# Patient Record
Sex: Female | Born: 1955
Health system: Southern US, Community
[De-identification: ages and names within clinical notes are randomized; demographics above are authoritative.]

## PROBLEM LIST (undated history)

## (undated) DIAGNOSIS — R0902 Hypoxemia: Secondary | ICD-10-CM

## (undated) DIAGNOSIS — F329 Major depressive disorder, single episode, unspecified: Secondary | ICD-10-CM

## (undated) DIAGNOSIS — I2089 Other forms of angina pectoris: Secondary | ICD-10-CM

## (undated) DIAGNOSIS — J439 Emphysema, unspecified: Secondary | ICD-10-CM

## (undated) DIAGNOSIS — N189 Chronic kidney disease, unspecified: Secondary | ICD-10-CM

## (undated) DIAGNOSIS — I6529 Occlusion and stenosis of unspecified carotid artery: Secondary | ICD-10-CM

## (undated) DIAGNOSIS — N393 Stress incontinence (female) (male): Secondary | ICD-10-CM

## (undated) DIAGNOSIS — J449 Chronic obstructive pulmonary disease, unspecified: Secondary | ICD-10-CM

## (undated) DIAGNOSIS — F418 Other specified anxiety disorders: Secondary | ICD-10-CM

## (undated) DIAGNOSIS — F419 Anxiety disorder, unspecified: Secondary | ICD-10-CM

## (undated) DIAGNOSIS — K219 Gastro-esophageal reflux disease without esophagitis: Secondary | ICD-10-CM

## (undated) DIAGNOSIS — I10 Essential (primary) hypertension: Secondary | ICD-10-CM

## (undated) DIAGNOSIS — I219 Acute myocardial infarction, unspecified: Secondary | ICD-10-CM

## (undated) DIAGNOSIS — I2510 Atherosclerotic heart disease of native coronary artery without angina pectoris: Secondary | ICD-10-CM

## (undated) DIAGNOSIS — Z923 Personal history of irradiation: Secondary | ICD-10-CM

## (undated) DIAGNOSIS — I208 Other forms of angina pectoris: Secondary | ICD-10-CM

## (undated) DIAGNOSIS — E119 Type 2 diabetes mellitus without complications: Secondary | ICD-10-CM

## (undated) DIAGNOSIS — E785 Hyperlipidemia, unspecified: Secondary | ICD-10-CM

## (undated) DIAGNOSIS — F32A Depression, unspecified: Secondary | ICD-10-CM

## (undated) DIAGNOSIS — R51 Headache: Secondary | ICD-10-CM

## (undated) DIAGNOSIS — C50919 Malignant neoplasm of unspecified site of unspecified female breast: Secondary | ICD-10-CM

## (undated) HISTORY — DX: Essential (primary) hypertension: I10

## (undated) HISTORY — DX: Occlusion and stenosis of unspecified carotid artery: I65.29

## (undated) HISTORY — PX: CARDIAC CATHETERIZATION: SHX172

## (undated) HISTORY — DX: Personal history of irradiation: Z92.3

## (undated) HISTORY — DX: Atherosclerotic heart disease of native coronary artery without angina pectoris: I25.10

## (undated) HISTORY — PX: ELBOW SURGERY: SHX618

## (undated) HISTORY — DX: Other forms of angina pectoris: I20.8

## (undated) HISTORY — DX: Other forms of angina pectoris: I20.89

## (undated) HISTORY — DX: Anxiety disorder, unspecified: F41.9

## (undated) HISTORY — PX: BREAST SURGERY: SHX581

## (undated) HISTORY — DX: Depression, unspecified: F32.A

## (undated) HISTORY — DX: Major depressive disorder, single episode, unspecified: F32.9

## (undated) HISTORY — DX: Emphysema, unspecified: J43.9

## (undated) HISTORY — DX: Malignant neoplasm of unspecified site of unspecified female breast: C50.919

## (undated) HISTORY — DX: Hypoxemia: R09.02

## (undated) HISTORY — DX: Chronic obstructive pulmonary disease, unspecified: J44.9

## (undated) HISTORY — DX: Acute myocardial infarction, unspecified: I21.9

## (undated) HISTORY — PX: CATARACT EXTRACTION: SUR2

## (undated) HISTORY — DX: Hyperlipidemia, unspecified: E78.5

## (undated) HISTORY — DX: Other specified anxiety disorders: F41.8

## (undated) HISTORY — DX: Type 2 diabetes mellitus without complications: E11.9

---

## 1997-10-09 ENCOUNTER — Other Ambulatory Visit: Admission: RE | Admit: 1997-10-09 | Discharge: 1997-10-09 | Payer: Self-pay | Admitting: Obstetrics & Gynecology

## 2001-05-24 ENCOUNTER — Other Ambulatory Visit: Admission: RE | Admit: 2001-05-24 | Discharge: 2001-05-24 | Payer: Self-pay | Admitting: Family Medicine

## 2002-01-31 ENCOUNTER — Ambulatory Visit (HOSPITAL_COMMUNITY): Admission: RE | Admit: 2002-01-31 | Discharge: 2002-01-31 | Payer: Self-pay | Admitting: Internal Medicine

## 2002-01-31 ENCOUNTER — Encounter (INDEPENDENT_AMBULATORY_CARE_PROVIDER_SITE_OTHER): Payer: Self-pay | Admitting: Specialist

## 2004-08-26 ENCOUNTER — Emergency Department (HOSPITAL_COMMUNITY): Admission: EM | Admit: 2004-08-26 | Discharge: 2004-08-26 | Payer: Self-pay | Admitting: Emergency Medicine

## 2006-11-01 ENCOUNTER — Ambulatory Visit: Payer: Self-pay | Admitting: Cardiology

## 2006-11-01 ENCOUNTER — Inpatient Hospital Stay (HOSPITAL_COMMUNITY): Admission: EM | Admit: 2006-11-01 | Discharge: 2006-11-05 | Payer: Self-pay | Admitting: Emergency Medicine

## 2006-11-02 HISTORY — PX: CORONARY STENT PLACEMENT: SHX1402

## 2006-11-17 ENCOUNTER — Ambulatory Visit: Payer: Self-pay | Admitting: *Deleted

## 2006-11-25 ENCOUNTER — Ambulatory Visit: Payer: Self-pay

## 2006-11-25 ENCOUNTER — Ambulatory Visit: Payer: Self-pay | Admitting: Cardiovascular Disease

## 2006-12-24 ENCOUNTER — Encounter (HOSPITAL_COMMUNITY): Admission: RE | Admit: 2006-12-24 | Discharge: 2007-01-07 | Payer: Self-pay | Admitting: Cardiovascular Disease

## 2007-03-03 ENCOUNTER — Ambulatory Visit: Payer: Self-pay | Admitting: Cardiovascular Disease

## 2007-05-16 ENCOUNTER — Ambulatory Visit: Payer: Self-pay | Admitting: Cardiovascular Disease

## 2007-05-16 LAB — CONVERTED CEMR LAB
ALT: 21 units/L (ref 0–35)
Albumin: 3.8 g/dL (ref 3.5–5.2)
Alkaline Phosphatase: 72 units/L (ref 39–117)
Total CHOL/HDL Ratio: 5.9
Total Protein: 6.8 g/dL (ref 6.0–8.3)
VLDL: 47 mg/dL — ABNORMAL HIGH (ref 0–40)

## 2007-07-01 ENCOUNTER — Ambulatory Visit: Payer: Self-pay | Admitting: Cardiovascular Disease

## 2008-10-05 ENCOUNTER — Emergency Department (HOSPITAL_COMMUNITY): Admission: EM | Admit: 2008-10-05 | Discharge: 2008-10-05 | Payer: Self-pay | Admitting: Emergency Medicine

## 2009-02-11 ENCOUNTER — Telehealth (INDEPENDENT_AMBULATORY_CARE_PROVIDER_SITE_OTHER): Payer: Self-pay | Admitting: *Deleted

## 2010-08-17 ENCOUNTER — Encounter: Payer: Self-pay | Admitting: Cardiovascular Disease

## 2010-11-06 LAB — URINALYSIS, ROUTINE W REFLEX MICROSCOPIC
Bilirubin Urine: NEGATIVE
Hgb urine dipstick: NEGATIVE
Nitrite: NEGATIVE
Protein, ur: NEGATIVE mg/dL
Specific Gravity, Urine: 1.012 (ref 1.005–1.030)
Urobilinogen, UA: 0.2 mg/dL (ref 0.0–1.0)

## 2010-11-06 LAB — CBC
HCT: 49.2 % — ABNORMAL HIGH (ref 36.0–46.0)
Hemoglobin: 17 g/dL — ABNORMAL HIGH (ref 12.0–15.0)
MCV: 91.1 fL (ref 78.0–100.0)
RBC: 5.4 MIL/uL — ABNORMAL HIGH (ref 3.87–5.11)
WBC: 12.5 10*3/uL — ABNORMAL HIGH (ref 4.0–10.5)

## 2010-11-06 LAB — POCT I-STAT, CHEM 8
BUN: 10 mg/dL (ref 6–23)
Creatinine, Ser: 0.8 mg/dL (ref 0.4–1.2)
Glucose, Bld: 90 mg/dL (ref 70–99)
Hemoglobin: 17.7 g/dL — ABNORMAL HIGH (ref 12.0–15.0)
TCO2: 28 mmol/L (ref 0–100)

## 2010-11-06 LAB — ETHANOL: Alcohol, Ethyl (B): 5 mg/dL (ref 0–10)

## 2010-11-06 LAB — RAPID URINE DRUG SCREEN, HOSP PERFORMED
Amphetamines: NOT DETECTED
Barbiturates: NOT DETECTED
Benzodiazepines: POSITIVE — AB
Opiates: NOT DETECTED
Tetrahydrocannabinol: NOT DETECTED

## 2010-11-06 LAB — DIFFERENTIAL
Eosinophils Absolute: 0.2 10*3/uL (ref 0.0–0.7)
Eosinophils Relative: 1 % (ref 0–5)
Lymphocytes Relative: 16 % (ref 12–46)
Lymphs Abs: 2 10*3/uL (ref 0.7–4.0)
Monocytes Relative: 5 % (ref 3–12)

## 2010-11-06 LAB — URINE MICROSCOPIC-ADD ON

## 2010-11-06 LAB — URINE CULTURE: Colony Count: >=100000

## 2010-12-09 NOTE — Assessment & Plan Note (Signed)
[facility]                            CARDIOLOGY OFFICE NOTE   NAME:Michelini, SHAGUN COOMER                      MRN:          [id number]  DATE:03/03/2007                            DOB:          07-Sep-1955    [name] was seen as an outpatient at the [facility] office  on March 03, 2007.  [name] is a very nice 55 year old woman who has  coronary artery disease and had a non-ST-elevation MI earlier this year.  She underwent percutaneous intervention of the right coronary artery.  Unfortunately, [name] has had a lot of social stressors lately.  She is currently drawing her last week of unemployment and has been  aggressively looking for a job, but has not had any luck obtaining work.  She has been very stressed about this and has had some increasing chest  pain.  She states that she is not sleeping well.  She has had some  dyspnea, but relates this to being out in the hot weather.  She has not  had any orthopnea or PND.  She has not had any symptoms similar to those  of her myocardial infarction.  She has resumed smoking cigarettes to  deal with the stress.  She denies palpitations, light-headedness,  syncope, orthopnea, PND, or edema.   CURRENT MEDICATIONS:  1. Aspirin 325 mg daily.  2. Plavix 75 mg daily.  3. Metoprolol ER 50 mg daily.  4. Lipitor 40 mg daily.  5. Trazodone 100 mg at bedtime.  6. Lisinopril 5 mg daily.   ALLERGIES:  No known drug allergies.   PHYSICAL EXAMINATION:  The patient is alert and oriented.  She is in no  acute distress.  Her weight is 216 pounds, blood pressure 120/79, heart rate 71,  respiratory rate is 16.  HEENT:  Normal.  NECK:  Normal carotid upstrokes with bilateral bruits.  Jugular venous  pressure is normal.  LUNGS:  There are a few rhonchi, but otherwise clear.  CARDIOVASCULAR:  The apex is non-displaced.  There is no right  ventricular heave or lift.  The heart is regular rate and rhythm  with a  2/6 ejection murmur along the left sternal border.  ABDOMEN:  Soft and nontender.  No organomegaly.  EXTREMITIES:  No cyanosis, clubbing, or edema.  Peripheral pulses are 2+  and equal throughout.   ASSESSMENT:  [name] appears stable from a cardiovascular  standpoint.  I think her symptoms are related to her high stress level.  She agrees and thinks that, if she is able to find work, she will begin  feeling much better.  She has had a long history of trouble with anxiety  and depression.  From a cardiac standpoint, her problems are as follows.  1. Coronary artery disease with non-ST-elevation myocardial      infarction.  She underwent stenting of her right coronary artery      and had a good result.  She did have some residual disease in other      portions of the vessel, but the other stenoses were no worse than  mild to moderate lesions.  She also had a total occlusion of the      small circumflex branch and appears to be stable with medical      therapy.  Will continue treatment with lisinopril, long-acting      metoprolol, and dual antiplatelet therapy with aspirin and      clopidogrel.  She was counseled about tobacco cessation.  2. Hypertension.  Her blood pressure is well-controlled on her current      medical therapy.  3. Dyslipidemia.  She has been on Pravachol and was recently switched      by her primary care physician to Lipitor.  We have scheduled      followup lipids and LFTs in 2 months.   I would like to see [name] back in approximately 3 months for  followup or sooner if any new problems arise.     Juanda Bond. [name], MD  Electronically Signed    MDC/MedQ  DD: 03/03/2007  DT: 03/04/2007  Job #: [id number]   cc:   Dr. Olivia Mackie

## 2010-12-09 NOTE — Assessment & Plan Note (Signed)
[facility]                            CARDIOLOGY OFFICE NOTE   NAME:[name], Reid                      MRN:          [id number]  DATE:07/01/2007                            DOB:          08/28/1955    [name] is a 55 year old woman with coronary artery disease.  She  had a non-ST elevation MI in April of this year.  She underwent PCI of  the right coronary artery at that time.  [name] has been  experiencing a good deal of stress at work.  She has had a lot of social  stressors over the last several months.  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  With increased stresses at work over  the last several days, she had noted increasing chest pain.  Her pain is  felt somewhat similar to her past cardiac pain but less intense.  She  complains of associated dyspnea but she does admit to chronic exertional  dyspnea.  She carries sublingual nitroglycerin but has not taken any.  She has no other complaints at this time.  She denies any exertional  symptoms.   CURRENT MEDICATIONS:  1. Aspirin 325 mg daily.  2. Plavix 75 mg daily.  3. Lisinopril 5 mg daily.  4. Trazodone 100 mg at bedtime.  5. Metoprolol ER 50 mg daily.  6. Lipitor 40 mg daily.   ALLERGIES:  NKDA.   EXAM:  The patient is alert and oriented.  She is in no acute distress.  Weight is 211, blood pressure 136/84, heart rate 65, respiratory rate  16.  HEENT:  Normal.  NECK:  Normal carotid upstrokes with bilateral carotid bruits.  LUNGS:  Clear to auscultation bilaterally.  HEART:  Regular rate and rhythm without murmurs or gallops.  ABDOMEN:  Soft, nontender.  No organomegaly, no abdominal bruits.  EXTREMITIES:  No clubbing, cyanosis or edema.  Peripheral pulses are  intact and equal.   EKG shows normal sinus rhythm and is within normal limits.   ASSESSMENT:  1. Coronary artery disease.   The patient remains on dual antiplatelet      therapy with aspirin and Plavix.  She is on a good medical program      that includes a statin, beta-blocker and ACE inhibitor.  I am      concerned about her recurrent chest pain.  While it is somewhat      difficult to sort out, as stress has been a major problem for her,      her pain is reminiscent of past cardiac chest pain.  I have      scheduled her for an exercise Myoview stress study.  We will be in      touch with her after the results of this are available.  Otherwise,      I did not make any changes to her medical regimen today.  2. Dyslipidemia.  She remains on atorvastatin 40 milligrams.  Lipid  panel from October 20th showed an LDL cholesterol of 100, an HDL of      26 and total cholesterol of 154, her triglycerides were 234, will      continue.  3. Hypertension.  Good control at present on metoprolol and      lisinopril.  4. Bilateral carotid bruits.  She underwent a carotid ultrasound back      in May of this year that showed mild plaque in the carotid arteries      with 0 to 39 percent stenosis.   For followup, I would like to see [name] back in 3 months.  If her  stress test is abnormal, will consider repeat cardiac catheterization.     Juanda Bond. [name], MD  Electronically Signed    MDC/MedQ  DD: 07/01/2007  DT: 07/01/2007  Job #: [id number]

## 2010-12-12 NOTE — Procedures (Signed)
[place]  Patient:    [name], [name] Visit Number: [id number] MRN: [id number]          Service Type: END Location: ENDO Attending Physician:  Vincent Peyer Dictated by:   Lowella Bandy. Olevia Perches, M.D. [facility] Admit Date:  01/31/2002 Discharge Date: 01/31/2002   CC:         Clois Dupes, M.D.   Procedure Report  PROCEDURES:  1. Upper endoscopy.  2. Colonoscopy.  ENDOSCOPIST:  Lowella Bandy. Olevia Perches, M.D.  INDICATIONS:  This 55 year old white female has complaint of epigastric pain and burning, partially relieved by Protonix.  She has had chronic nausea without vomiting and feelings of early satiety.  She was found to be Hemoccult positive on physical exam.  The other problems have been diarrhea and mucus. She has a six-week history of hematochezia preceeded by diarrhea.  On physical exam on December 25, 2001, her stool was Hemoccult negative.  She gives a family history of colon polyps in her mother.  She is undergoing upper endoscopy to evaluate her epigastric pain and colonoscopy to evaluate her hematochezia and Hemoccult positive stools.  ENDOSCOPE:  Olympus single-channel videoscope.  SEDATION:  Versed 5 mg IV.  Demerol 70 mg IV.  FINDINGS:  The Olympus single-channel videoscope was passed under direct vision through the posterior pharynx into the esophagus.  The patient was monitored by pulse oximeter.  Her oxygen saturations were 93% to 98%.  She was cooperative.  The proximal, mid and distal esophageal mucosa was normal.  The squamocolumnar junction was identified at 40 cm from the incisors.  Only minor irregularity of the Z line was noted, which was within normal limits.  There was no stricture and no hiatal hernia.  STOMACH:  The stomach was insufflated with air and showed empty stomach with a minimal amount of acid.  There was no food in the stomach.  The gastric outlet was unremarkable.  The mucosa of the antrum and body was normal.   Retroflexion of the endoscope revealed normal fundus and cardia.  DUODENUM:  The duodenal bulb was edematous, with large folds, mucosal hemorrhages and petechiae extending throughout the entire duodenal bulb.  The descending duodenum was normal.  Biopsies were taken from inflamed mucosa for CLOtest and for routine H&E stains.  The endoscope was then retracted and the stomach decompressed.  The patient tolerated the procedure well.  IMPRESSION:  Mild to moderate duodenitis status post biopsies and CLOtest.  COLONOSCOPY:  ENDOSCOPE:  Olympus single-channel videoscope.  SEDATION:  Additional Versed 5 mg IV and Demerol 20 mg IV.  FINDINGS:  The Olympus single-channel videoscope was passed under direct vision through the rectum to the sigmoid colon.  The patient was monitored by pulse oximeter. Her oxygen saturations were about 90% at all times.  Her prep was only marginal because she prepped only with 1/2 gallon of the GoLYTELY. The anal canal showed first grade hemorrhoids.  These were visualized in forward as well as retroflex view.  There was no prolapse and no active bleeding.  At the level of 15 cm from the rectum was a polyp measuring about 1 cm in diameter.  It was partially pedunculated, was removed with snare and sent to pathology.  Distal to this at the level of about 12 cm from the rectum was a sessile polyp on a fold which was ablated with hot biopsy and sent to pathology in the same specimen container.  The rest of the sigmoid colon was unremarkable.  There were no diverticula.  The splenic flexure, transverse colon, and hepatic flexure were normal, with some liquid stool obscuring the view in the right colon.  The cecal pouch was reached without difficulty and showed normal ileocecal valve and cecal pouch.  The colonoscope was then retracted and the colon decompressed.  Both polypectomy sites were inspected and appeared to have complete hemostasis.  IMPRESSION:  1. Two  sigmoid polyps status post polypectomy.  2. First grade hemorrhoids.  PLAN:  1. Postpolypectomy instructions.  2. Anusol HC suppositories for hemorrhoids, which I think are the cause of the patients hematochezia. 3. Librax, one p.o. b.i.d. for presumed alternating diarrhea, constipation and abdominal pain consistent with irritable bowel syndrome. Dictated by:   Lowella Bandy. Olevia Perches, M.D. National Attending Physician:  Vincent Peyer DD:  01/31/02 TD:  02/02/02 Job: [id number]

## 2010-12-12 NOTE — Assessment & Plan Note (Signed)
[facility]                            CARDIOLOGY OFFICE NOTE   NAME:[name], [name]                      MRN:          [id number]  DATE:11/17/2006                            DOB:          [birth year]    This is a 55 year old white female patient who presented to the hospital  with a non-ST elevation MI secondary to two-vessel coronary artery  disease.  She underwent bare metal stenting to a 95% RCA, but she also  had distal circumflex branch that PCI was attempted but was  unsuccessful.  She had mild segmental LV dysfunction with basal to mid  inferior hypokinesis, ejection fraction of 50%.  The patient also has  depression and was seen by Dr. [name] who placed her on trazodone and  __________ and to follow up with psychiatry through her primary care.  She was also found to have some glucose intolerance.  Her hemoglobin A1c  was elevated at 6.4, but she was not sent home on any diabetic  medications.   The patient is quite stressed out about her financial situation.  When  she came home, her job was terminated and she has no insurance.  She  denies any chest pain, palpitations, dizziness, shortness of breath or  presyncope.  Overall, from a cardiac standpoint, she is doing well.   CURRENT MEDICATIONS:  1. Trazodone 100 mg one-half q.h.s.  2. Aspirin 325 daily.  3. Plavix 75 mg daily.  4. Metoprolol 25 mg b.i.d.  5. Celexa one daily.  6. Lisinopril 5 mg daily.  7. Pravastatin 40 mg.  She was taking 2 a day and broke out in a rash      and stopped it.  She is not sure.  This was changed from      simvastatin in the hospital because it was a $4.00 drug.   PHYSICAL EXAMINATION:  GENERAL:  This is a pleasant 55 year old white  female in no acute distress.  VITAL SIGNS:  Blood pressure 115/80, pulse 64, weight 208.  NECK:  She has bilateral carotid bruits.  LUNGS:  Clear, anterior and posterolateral.  HEART:  Regular rate and rhythm at 64 beats  per minute.  Normal S1 and  S2.  No significant murmur heard.  ABDOMEN:  Soft without organomegaly, masses, lesions or abnormal  tenderness.  RIGHT GROIN:  Without hematoma or hemorrhage.  EXTREMITIES:  She has good distal pulses.  No clubbing, cyanosis, or  edema.   ELECTROCARDIOGRAM:  Normal sinus rhythm.  No acute change.   IMPRESSION:  1. Status post non-ST elevation myocardial infarction secondary to two-      vessel coronary artery disease.  Status post bare metal stenting to      the right coronary artery with residual distal left circumflex      occlusion with unsuccessful percutaneous coronary intervention to      this vessel.  2. Mild left ventricular dysfunction, ejection fraction of 50% with      basal mid inferior hypokinesis.  3. Hypertension.  4. Hypercholesterolemia.  5. Glucose intolerance that needs to be followed by primary.  6. Depression.  7. Increased social stressors, loss of job, no insurance.   PLAN AT THIS TIME:  We are looking for some Plavix samples to get her  through until she receives them through the Plavix Assistance Program.  I have asked her to start pravastatin 40 mg once a day one more time to  see if she tolerates this.  If not, she will need to be changed to  another drug, and I have filled out a worksheet in order for her to  obtain food stamps and apply for new jobs when she is approved after she  sees Dr. [name] back on Nov 25, 2006.      Ermalinda Barrios, PA-C  Electronically Signed      Signa Kell, MD, [facility]  Electronically Signed   ML/MedQ  DD: 11/17/2006  DT: 11/17/2006  Job #: [id number]   cc:   Juanda Bond. [name], MD  Adolm Joseph, M.D.

## 2010-12-12 NOTE — Discharge Summary (Signed)
NAMEHASTI, [name]               ACCOUNT NO.:  [id number]   MEDICAL RECORD NO.:  [id number]          PATIENT TYPE:  INP   LOCATION:  6523                         FACILITY:  [facility]   PHYSICIAN:  Juanda Bond. [name], MD  DATE OF BIRTH:  1956/04/27   DATE OF ADMISSION:  11/01/2006  DATE OF DISCHARGE:  11/05/2006                               DISCHARGE SUMMARY   PRIMARY CARE PHYSICIAN:  [name], M.D.   PRIMARY CARDIOLOGIST:  Juanda Bond. [name], M.D.   CONSULTING PHYSICIAN:  [name], M.D., psychiatry   PROCEDURE PERFORMED:  1. Cardiac catheterization date November 02, 2006.      a.     Two vessel coronary artery disease with total occlusion of a       small branch of the left circumflex and high grade focal stenosis       of the right coronary artery.      b.     Unsuccessful attempts at PCI of the left circumflex, but       unable to cross the loose wire as described with one bare-metal       stent.  The left circumflex at the proximal portion had a 40%       stenosis of the left circumflex, gave rise to a small intermediate       branch and a second OM territory, there is a third OM that is so       total occluded and fills late lightly for the antegrade flow.  The       right coronary artery has a high grade anterior origin, the mid       portion of the vessel had a 50% stenosis and the distal portion of       the vessel had a 95% irregular stenosis status post liberty 3 x 2       mm stent was placed with TIMI-3 flow throughout the vessel.  There       is a residual 50% to 60% stenosis in the mid vessel.  Dr. [name]       elected not to intervene on that area.   DISCHARGE DIAGNOSES:  1. Non-ST elevated myocardial infarction secondary to 2 vessel      coronary artery disease.      a.     Status post bare-metal stent for 95% right coronary artery       occlusion.      b.     Residual distal left circumflex occlusion (small vessel).  2. Depression.   SECONDARY  DIAGNOSES:  1. Hypertension.  2. Hypercholesterolemia.  3. Increased social stressors.      a.     No insurance and low income.   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  She felt very diaphoretic,  lightheaded, and short of breath.  Chest tightness was exhibited at a 7-  8/10.  She  also had radiation to both arms and her shoulder and her  neck.  The patient had had some discomfort the night before, which she  describes as squeezing sensation in the center of her chest with  radiation to the shoulders and back, lasting about 5 to 10 minutes.  She  put a heating pad on the area and felt better, was able to sleep.  However, the following morning she exhibited symptoms as described.  The  patient's co-worker called 9-1-1.  She was given baby aspirin and  nitroglycerin and was also given an injection of Toradol with minimal  relief of her chest discomfort in the ER.   The patient was seen and examined by Dr. Jacqulyn Reid and [name], ACNP in the emergency room.  The patient's vital signs were  found to be 121/55, heart rate 59, respirations 16.  The patient was  admitted, cardiac enzymes were cycled.  Followup labs were also checked  to include lipids and TSH.  The patient was placed on heparin  nitroglycerin and aspirin the following day.   On the following day, November 02, 2006, the patient was seen and examined  by Tracy Ferries, PA-C and Dr. Lattie Reid with continued complaints of  chest discomfort.  She also had been crying all night on and off, very  depressed, very stressed secondary to financial situation along with  ineffective coping skills.  The patient was reassured.  The patient's  troponin's were reevaluated.  Troponin was 1.0, 4.19, and 3.32.  The  patient is  scheduled for cardiac catheterization this day.   Catheterization was completed as described above by [name].  Please see Dr. Antionette Reid thorough dictation for more details.  She did  recover well without evidence of hematoma, swelling, or infection.  There was no recurrence of chest discomfort.  The patient was also seen  that evening by Richardson Dopp, PA-C.  She did have some complaints of  chest discomfort in the late evening.  The patient also continued to be  very emotional and upset.  She did have a prior history of depression,  but was not on any anti-depressants at that time.  She has had a lot of  social stressors that were expressed by the patient to include financial  issues and lack of insurance.  The patient recently started a new job  and now had to be placed at a work secondary to her recent non-ST  elevated MI.   Dr. [name] had psychiatry consulted and Dr. Rhona Reid did see the  patient on November 04, 2006.  His recommendations were that she was not a  harm to herself or others and was started on Venlafaxene 25 mg t.i.d.  along with trazodone 25 mg nightly.  The patient was also to have  followup with psychiatry through her primary care physician.  The  patient was also seen by social services to assist with medications  prior to being discharged.  Smoking cessation instruction was also  provided to the patient through cardiac rehab.   Also during hospitalization this patient was found to have an apparent  glucose intolerance.  Her hemoglobin A1c was found to be elevated at 6.4  with sliding scale insulin provided during hospitalization for blood  pressure correction and for blood glucose control.  The patient was not  started on any diabetic medications on discharge with followup with her  primary care physician.  I strongly recommended for the patient to  continue evaluation for  diabetes and glucose control.  On November 05, 2006, the patient was seen and  examined by [name] and found to be stable for discharge.  She will continue aspirin  and Plavix.  The Plavix physician form had been filled out and she is  going to be provided with some medications prior to discharge through  our hospital.  She is to follow with her primary care physician with  assessment and recommendations for followup psychiatric care concerning  her major depression through Dr. Daylene Katayama office.   LABORATORY DATA:  Labs on discharge:  Hemoglobin 12.9, hematocrit 37.9,  white blood cell 10.2, platelets 180.  PT 13.2, INR 1, PTT 30.  Sodium  142, potassium 4.9, chloride 110, CO2 26, glucose 108, BUN 12,  creatinine 0.99, troponin 3.19, 2.43, and 2.29 respectively.  Hemoglobin  A1c 6.4, calcium 8.8, cholesterol 224, triglycerides 222, HDL 31, LDL  149.   Chest x-ray dated November 01, 2006, revealing stable examination with no  active cardiopulmonary process.   DISCHARGE MEDICATIONS:  1. Trazodone 25 mg p.o. nightly.  2. Venlafaxine 25 mg t.i.d. (with increase to 50 mg t.i.d. if blood      pressure is well maintained this can be done as an outpatient      followup).  3. Protonix 40 mg once a day.  4. Aspirin 325 once a day.  5. Plavix 75 mg once a day.  6. Lisinopril 5 mg daily.  7. Zocor 80 mg daily.  8. Metoprolol 25 mg b.i.d.  9. Nitroglycerin 0.4 mg sublingual p.r.n. chest pain.   ALLERGIES:  NO KNOWN DRUG ALLERGIES.   FOLLOWUP PLAN AND APPOINTMENT:  1. The patient is scheduled to see Dr. [name] on May 1 at 9:45 a.m.  2. The patient is to followup with primary care physician Dr. Quita Skye      in 1 week.  3. Highly advised that the patient followup with psychiatry through      Dr. Baruch Merl office for continued psychiatric care and evaluation      for patient's response to medications with depression.  4. The patient was seen by social services for followup and help      concerning social issues and medication affordability.      Phill Myron. Purcell Nails, NP      Juanda Bond. [name], MD  Electronically Signed    KML/MEDQ  D:  11/05/2006  T:  11/05/2006  Job:  [id number]   cc:   [name], M.D.

## 2010-12-12 NOTE — H&P (Signed)
[name], [name]               ACCOUNT NO.:  [id number]   MEDICAL RECORD NO.:  [id number]          PATIENT TYPE:  INP   LOCATION:  2921                         FACILITY:  [facility]   PHYSICIAN:  Cristopher Estimable. [name], MD, FACCDATE OF BIRTH:  Jul 12, 1956   DATE OF ADMISSION:  11/01/2006  DATE OF DISCHARGE:                              HISTORY & PHYSICAL   PRIMARY CARE PHYSICIAN:  Dr. Olivia Mackie, [facility].   HISTORY OF PRESENT ILLNESS:  [name] is a 55 year old, Caucasian  female with no known history of coronary artery disease who presents to  [facility] Emergency Room on day of admission complaining of chest  tightness.  Ms. Abdou complains of increased stress over the last few  months secondary to looking for a job.  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  She contributed her symptoms initially to the increased stress with her  job and the movement repetitive positions.  Apparently, has been  complaining of increased shortness of breath x2 weeks and decreased  energy level x6 months with increased worsening over last week.  She  also felt she was mildly depressed secondary to having no insurance and  no job and contributed her symptoms to this also.  Apparently, last  night around 9 p.m., she was watching TV sitting on the sofa with no  problems when she began having some substernal chest discomfort.  She  described it as a squeezing sensation in the center of her chest with  radiation into her shoulders and neck.  She described it as an aching  sensation in shoulders and neck.  It lasted around 5-10 minutes.  She  thought she was getting chilled, so she put a heating pad on and felt  like she had some relief.  She went on to bed and slept last night  without problems.  This morning, she got up and states she initially  felt okay.  She went on to work and around 9 a.m.  While at work,  she  was doing dishes and she suddenly became very diaphoretic, lightheaded  and short of breath.  She experienced intense tightness in her chest.  She got nauseated and thought she was going to pass out.  Someone  noticed and they brought her a chair.  She sat down and put her head  down.  She rated the chest tightness at 7-8/10.  She states she never  felt anything like this before.  She states both her arms ached and her  shoulder and neck also.  She has a history of GERD and questionable  hiatal hernia.  This usually results in intense burning in her chest.  She states this felt nothing like this.  A co-worker called 9-1-1.  She  was given baby aspirin.  The  discomfort eased off waxed and waned and  returned on arrival to the emergency room.  Here in the ER, the patient  received an injection of Toradol with minimal relief currently  complaining of chest tightness.  PAST MEDICAL HISTORY:  1. GERD.  2. Questionable hiatal hernia.  3. Remote history of hypercholesteremia and has not been on any      medication.  4. Obesity.  5. Ongoing tobacco use.  6. Sigmoid polyps, status post polypectomy in 2003.   ALLERGIES:  NO KNOWN DRUG ALLERGIES.  Allergic to NUTMEG.   MEDICATIONS:  No medications at home.  She takes Aleve p.r.n.   SOCIAL HISTORY:  She lives in [place].  She lives alone.  She has one  adult child.  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  Tobacco use one pack a day x30+ years, no exercise,  occasional glass of wine or beer.  No diet restrictions, no drugs.   FAMILY HISTORY:  Mother alive with hypertension.  Father deceased at age  56.  He had a history of MI in his early 78s and hypertension.  Questionable cause of death aneurysm.  Sister with hypertension.   REVIEW OF SYSTEMS:  Positive for sweats, headaches, sore throat, chest  pain, short breath, dyspnea on exertion, palpitations, presyncope,  cough, lightheadedness x2 weeks,  increased shortness of breath x2 weeks,  decreased energy level x6 months with worsening x1 week.  Premenopausal  with some mild depression, myalgia, arthralgia in right upper arm and  knees.  Pain in her knees.  Nausea and GERD symptoms.   PHYSICAL EXAMINATION:  VITAL SIGNS:  Pulse 59, respirations 16, blood  pressure 121/55, saturations 100% on room air.  Weight 200 pounds,  height 5 feet 5 inches.  GENERAL:  In no acute distress.  HEENT:  Normocephalic, atraumatic.  Pupils equal, round, react to light.  Sclerae is clear.  NECK:  Supple without lymphadenopathy, no bruit, no JVD.  CARDIOVASCULAR:  S1-S2.  Pulses are 2+ and equal, somewhat distant heart  sounds.  LUNGS:  Distant breath sounds in bilateral bases with poor inspiratory  effort.  Otherwise, clear to auscultation.  ABDOMEN:  Soft, nontender, positive bowel sounds.  SKIN:  Warm and dry.  EXTREMITIES:  Without clubbing, cyanosis or edema.  NEUROLOGIC:  The patient alert and oriented x3.  Cranial nerves 2-12  grossly intact.   LABORATORY DATA AND X-RAY FINDINGS:  Chest x-ray shows no acute disease.  EKG sinus rhythm at a rate of 60 with incomplete right bundle branch  block.  The patient with some very minimal changes in V1 and V2,  possibly mild ST elevation less than 0.5 mm.   H&H 14.7 and 43.7, WBC 10.8, platelets 188.  Sodium 140, potassium 3.9,  BUN 12, creatinine 1, glucose 145.  Troponin initial point of care 0.05,  second set 0.23.  PT 13.2, INR 1.   ASSESSMENT:  Dr. [name] in to examine and assess patient with  complaints of chest discomfort concerning for angina, now with positive  point of care marker in the setting of obesity, sedentary lifestyle,  ongoing tobacco use, history of hypercholesteremia, gastroesophageal  reflux disease, questionable new-onset diabetes as blood glucose level  is elevated.  PLAN:  The patient will be admitted.  Cycle enzymes.  Check lipids and  TSH.  Medicate with  aspirin, nitroglycerin, heparin proton pump  inhibitor, Statin, low-dose beta blocker.  Risks and benefits of cardiac  catheterization have been discussed with the patient.  The patient  agrees to proceed with diagnostic exam.  Will scheduled for first case  in the morning as the patient is stable at this time.  [name], ACNP      Cristopher Estimable. [name], MD, [facility]  Electronically Signed   MB/MEDQ  D:  11/01/2006  T:  11/01/2006  Job:  [id number]

## 2010-12-12 NOTE — Assessment & Plan Note (Signed)
Central                            CARDIOLOGY OFFICE NOTE   NAME:[name]                      MRN:          [id number]  DATE:11/25/2006                            DOB:          01-04-1956    HISTORY OF PRESENT ILLNESS:  [name] is a very nice 55 year old  woman who presents for follow up after recent hospitalization at [facility] when she presented with a non-ST elevation myocardial infarction.  At that time, she was found to have an occluded branch of the left  circumflex and a high grade stenosis in the right coronary artery.  She  was treated with a bare metal stent in the right coronary artery and has  done well since that time.  She has not had any further substernal chest  pain.  She initially presented with a pressure-like sensation in the  substernal chest region.  She continues to have some fleeting left sided  sharp pains, but these are clearly distinct from her presentation.   [name] has had a great deal of social stress as she lost her job  immediately after her hospitalization.  She has had a great deal of  financial strain and other personal problems as well.  She was seen by  Dr. [name] in the hospital and was started on Trazodone at bedtime as  well as Celexa.  She was able to stop smoking cigarettes for only a few  days but has resumed smoking because of her high stress level.   Mrs. Artiga does complain of a few episodes of lightheadedness and had  one episode where she was sitting in a chair and developed tunnel  vision.  She became near syncopal but did not pass out.  The entire  episode lasted a few seconds and then her symptoms resolved, and she  felt back in her normal state of health soon thereafter.   CURRENT MEDICATIONS:  1. Xanax as needed.  2. Aspirin 325 mg daily.  3. Plavix 75 mg daily.  4. Metoprolol 25 mg twice daily.  5. Celexa daily.  6. Lisinopril 5 mg daily.  7. Pravastatin 40 mg  at bedtime.  8. Trazodone 100 mg at bedtime.   ALLERGIES:  NKDA.   PHYSICAL EXAMINATION:  GENERAL:  The patient is alert and oriented.  She  is in no acute distress.  VITAL SIGNS:  Weight 209 pounds, blood pressure 125/73, heart rate 63,  respirations 16.  HEENT:  Normal.  NECK:  Normal carotid upstrokes with bilateral bruits.  Jugular venous  pressure is normal.  LUNGS:  Clear to auscultation bilaterally.  HEART:  Regular rate and rhythm without murmurs or gallops.  ABDOMEN:  Soft, nontender, no organomegaly.  EXTREMITIES:  No cyanosis, clubbing or edema.  Peripheral pulses are 2+  and equal throughout.   STUDIES:  Studies reviewed included a carotid ultrasound which showed  mild bilateral plaque in the carotids that is not obstructive.   Labs from the hospital showed hemoglobin A1C 6.4, cholesterol 224,  triglycerides 222, HDL 31, LDL 149.   ASSESSMENT:  [name] is  currently stable from a cardiovascular  standpoint.   Her current issues are as follows:  1. Recent non-ST segment elevation MI status post bare metal stenting      of the right coronary artery.  She should continue on dual      antiplatelet therapy with aspirin and clopidogrel.  If she is able      to maintain clopidogrel through their drug assistance program, I      would like her to stay on dual antiplatelet therapy for one year.      If she is not able to obtain that, then it would be acceptable for      her to discontinue this medication after 30 days.  See below for      secondary risk reduction.  Her current chest pains are clearly      atypical and different from her presentation, and I am not      concerned about any ongoing ischemic process.  2. Hypertension, currently well controlled on a combination of      metoprolol and lisinopril.  Her episodes of lightheadedness and      tunnel vision are certainly concerning, although, her blood      pressure in the office today is normal.  We will continue  to follow      her from a symptomatic standpoint, and if she has recurrent near      syncopal episodes, will have to consider altering her medical      program.  3. Dyslipidemia.  She is currently on Pravachol and should have her      lipids and LFT's checked around the time of her return visit in      three months.   As described above, I plan on seeing [name] back in three months  time and will review her issues again at that point.  If she has further  problems, she was advised to contact the office in the interim.     Juanda Bond. [name], MD  Electronically Signed    MDC/MedQ  DD: 11/25/2006  DT: 11/25/2006  Job #: [id number]

## 2010-12-12 NOTE — Cardiovascular Report (Signed)
NAMETEDRA, SELZLER               ACCOUNT NO.:  [id number]   MEDICAL RECORD NO.:  [id number]          PATIENT TYPE:  INP   LOCATION:  2921                         FACILITY:  [facility]   PHYSICIAN:  Juanda Bond. [name], MD  DATE OF BIRTH:  1956-06-28   DATE OF PROCEDURE:  11/02/2006  DATE OF DISCHARGE:                            CARDIAC CATHETERIZATION   PROCEDURE:  Left heart catheterization, selective coronary angiography,  left ventricular angiography, attempted PCI of the left circumflex  (unable to cross lesion with wire,) and PTCA and stenting of the right  coronary artery, StarClose of the right femoral artery.   INDICATIONS:  [name] is a 55 year old woman who presented with  chest pain and ruled in for a myocardial infarction.  Her troponin rose  from an initial level of 0.2 up to 4.  She had stuttering chest pain  throughout the initial day of her hospitalization.  She was referred for  urgent cardiac catheterization.  Just prior to her catheterization, I  had a long discussion with her because she was very reluctant to proceed  with this test.  She has serious financial concerns.  We had a long  discussion and elected to proceed with her catheterization and will have  a financial counselor from the hospital review her situation.   Risks and indications of the procedure were reviewed with the patient in  detail.  Informed consent was obtained.  The right groin was prepped,  draped and anesthetized with 1% lidocaine.  Using modified Seldinger  technique, a 6-French sheath was placed in the right femoral artery.  Multiple views of the left and right coronary arteries were taken.  For  the left coronary artery a JL-4 catheter was used.  The right coronary  artery was difficult to selectively engage.  I attempted with a JR-4 and  no torque right catheter.  I ultimately used an AL-2 diagnostic catheter  and was able to select the right coronary artery with that.  Following  selective coronary angiography, an angled pigtail catheter was inserted  into the left ventricle and pressures were recorded.  Left  ventriculogram was performed.  A pullback across the aortic valve was  done.   At the conclusion of the diagnostic procedure, I elected to intervene on  two areas.  The mid left circumflex was totally occluded with faint  filling of the distal vessel.  It was unclear to me the chronicity of  this, but I thought it was worth an attempt.  The mid-right coronary  artery also had a 95% irregular appearing plaque that correlated with  the patient's wall motion abnormality.  Angiomax was used for  anticoagulation.  Six hundred milligrams of clopidogrel was given.  An  XB 3.5 mm guide catheter was inserted with plans on attempting the left  circumflex first.  A cougar guidewire was inserted into the lesion, but  I was unable to cross the lesion with that despite a prolonged attempt.  I then tried a whisper wire and I was able to pass the whisper wire  across the lesion, but was not sure if  I was intraluminal.  I elected to  discontinue the left circumflex intervention at that point because the  lesion behaved like a more chronic lesion based on the difficulty of  crossing with a guidewire.  At that point the wire was removed and  angiographic images showed no change in the appearance of the left  circumflex.  I then attempted to engage the right coronary artery which  was difficult.  I used multiple guide catheters, but ultimately was able  to engage this with a Williams right guide.  Once selectively engaged,  the cougar guidewire was inserted easily beyond the lesion into the  distal vessel.  The lesion was predilated with a 20 x 15-mm Maverick  balloon up to 10 atmospheres on two inflations.  At that point a Liberte  3.0 x 20 mm stent was deployed at 14 atmospheres and was well expanded.  I postdilated that stent with a 3.25 x 15-mm Quantum Maverick up to 16   atmospheres on two inflations.  There was an excellent angiographic  result.  There was TIMI III flow throughout the vessel.  There is  residual 50-60% stenosis in the mid vessel, but I elected not to  intervene on that area.  At the conclusion of the case, a StarClose  device was used to seal the right femoral arteriotomy.   FINDINGS:  Aortic pressure is 164/80 with a mean of 116, left  ventricular pressure 165/6 with an end-diastolic pressure of 22.  There  is no aortic stenosis.   The left mainstem is angiographically normal.  It trifurcates into the  LAD, left circumflex and ramus intermedius.  The LAD has mild  nonobstructive luminal irregularity in its proximal portion of no  greater than 30%.  The LAD gives off a large first diagonal branch.  The  mid and distal LAD have minor luminal irregularities, but no significant  angiographic disease.  The LAD also gives off a medium-size second  diagonal branch.   The left circumflex system is of medium caliber.  The proximal portion  of the left circumflex has a 40% stenosis.  The left circumflex gives  off a small intermediate branch in a second OM territory and there is a  third OM that is subtotally occluded and fills late likely from  antegrade flow.  The true AV groove circumflex has diffuse  nonobstructive disease.   The right coronary artery has a high anterior origin.  The midportion of  the vessel has a 50% stenosis and the distal portion of the vessel has a  95% irregular stenosis.  It gives off a large PDA branch and a small  posterolateral branch.  There is TIMI III flow throughout the vessel.   Left ventriculography performed both in the RAO and LAO projection  demonstrates basal to mid inferior segment of severe hypokinesis.  Other  LV segments are normal.  The left ventricular ejection fraction is 50%.  There is no mitral regurgitation.   ASSESSMENT: 1. Two-vessel coronary artery disease with total occlusion of a  small      branch of the left circumflex and high-grade focal stenosis of the      right coronary artery.  2. Mild segmental left ventricular dysfunction.   PLAN:  As detailed above, PCI of the left circumflex branch was  attempted, but I was unable to cross the lesion with a wire.  The right  coronary artery which is likely the patient's culprit vessel was  intervened upon as  described with one bare metal stent.  The patient  should continue on aspirin and clopidogrel for 30 days.  Will also place  her on a high-dose statin.  Will consult financial services or the case  manager to help with obtaining medications.      Juanda Bond. [name], MD  Electronically Signed     MDC/MEDQ  D:  11/02/2006  T:  11/02/2006  Job:  [id number]

## 2011-03-24 ENCOUNTER — Other Ambulatory Visit: Payer: Self-pay | Admitting: Family Medicine

## 2011-03-24 DIAGNOSIS — N6320 Unspecified lump in the left breast, unspecified quadrant: Secondary | ICD-10-CM

## 2011-03-31 ENCOUNTER — Other Ambulatory Visit: Payer: Self-pay | Admitting: Diagnostic Radiology

## 2011-03-31 ENCOUNTER — Ambulatory Visit
Admission: RE | Admit: 2011-03-31 | Discharge: 2011-03-31 | Disposition: A | Discharge status: Home / Self Care | Payer: Medicare Other | Source: Ambulatory Visit | Attending: Family Medicine | Admitting: Family Medicine

## 2011-03-31 DIAGNOSIS — N6320 Unspecified lump in the left breast, unspecified quadrant: Secondary | ICD-10-CM

## 2011-04-07 ENCOUNTER — Encounter (INDEPENDENT_AMBULATORY_CARE_PROVIDER_SITE_OTHER): Payer: Self-pay | Admitting: General Surgery

## 2011-04-09 ENCOUNTER — Ambulatory Visit (INDEPENDENT_AMBULATORY_CARE_PROVIDER_SITE_OTHER): Payer: Medicare Other | Admitting: General Surgery

## 2011-04-09 ENCOUNTER — Encounter (INDEPENDENT_AMBULATORY_CARE_PROVIDER_SITE_OTHER): Payer: Self-pay | Admitting: General Surgery

## 2011-04-09 VITALS — BP 134/88 | HR 72 | Temp 97.8°F | Ht 65.0 in | Wt 214.0 lb

## 2011-04-09 DIAGNOSIS — R928 Other abnormal and inconclusive findings on diagnostic imaging of breast: Secondary | ICD-10-CM

## 2011-04-09 NOTE — Progress Notes (Signed)
Chief Complaint  Patient presents with  . Other    evaluate abn. left MGM    HPI [name] is a 55 y.o. female.    This well and was referred to me Dr. Curlene Dolphin for consideration of excision of an area in the left breast at the 10 o'clock position.  The patient has not had any significant breast problems in the past. She had mastitis when she was breast-feeding many years ago at but no other problems. She is not aware of any abnormality of her breast.  She had mammograms at [facility] and we are awaiting those reports. She was referred to the [facility] for biopsy because of an area of focal architectural distortion at the 10:00 position of the left breast. Dr. Curlene Dolphin did an ultrasound-guided biopsy of this area and the final pathology report showed benign breast tissue and microcalcifications. Dr. Radford Pax feels that this is not concordant with the imaging findings and has referred the patient for excisional biopsy. The will require a needle localization.  The patient's family history is negative for breast cancer but she thinks her grandmother may have had ovarian cancer.  The patient's other significant medical problems are chronic anxiety and depression, coronary disease with coronary angioplasty and stent [name] in 2008, hypertension, and hyperlipidemia.  She is on Paxil, clonazepam, alprazolam, aspirin, beta blockers, lisinopril, she is followed at a health clinic in [facility].. She sees Dr. Tommi Rumps that the behavioral health clinic in [place]. . She is not regularly followed by cardiology. HPI  Past Medical History  Diagnosis Date  . Depression   . Hypertension   . Hyperlipidemia   . CAD (coronary artery disease)   . Anxiety   . COPD (chronic obstructive pulmonary disease)   . Myocardial infarction   . Heart attack     Past Surgical History  Procedure Date  . Coronary stent placement   . Cesarean section 1987    Family  History  Problem Relation Age of Onset  . Heart disease Mother   . Heart disease Father   . Cancer Maternal Aunt   . Cancer Maternal Grandmother     ovarian    Social History History  Substance Use Topics  . Smoking status: Current Everyday Smoker -- 1.0 packs/day  . Smokeless tobacco: Not on file  . Alcohol Use: Yes    No Known Allergies  Current Outpatient Prescriptions  Medication Sig Dispense Refill  . albuterol (PROVENTIL) (5 MG/ML) 0.5% nebulizer solution Take 2.5 mg by nebulization every 6 (six) hours as needed.        . ALPRAZolam (XANAX) 0.25 MG tablet Take 0.25 mg by mouth at bedtime as needed.        [name] aspirin 325 MG tablet Take 325 mg by mouth daily.        . clonazePAM (KLONOPIN) 0.5 MG tablet Take 0.5 mg by mouth at bedtime as needed.        [name] lisinopril (PRINIVIL,ZESTRIL) 5 MG tablet Take 5 mg by mouth daily.        . metoprolol tartrate (LOPRESSOR) 25 MG tablet Take 25 mg by mouth 2 (two) times daily.        [name] PARoxetine (PAXIL) 10 MG tablet Take 30 mg by mouth every morning.         Review of Systems Review of Systems 12 system review of systems is performed and is negative except as described above.Blood pressure 134/88, pulse  72, temperature 97.8 F (36.6 C), temperature source Temporal, height 5\' 5"  (1.651 m), weight 214 lb (97.07 kg).  Physical Exam Physical Exam  Constitutional: She is oriented to person, place, and time. She appears well-developed and well-nourished. No distress.  HENT:  Head: Normocephalic and atraumatic.  Nose: Nose normal.  Mouth/Throat: No oropharyngeal exudate.       dentures  Eyes: Conjunctivae are normal. Pupils are equal, round, and reactive to light. Left eye exhibits no discharge. No scleral icterus.  Neck: Normal range of motion. Neck supple. No JVD present. No tracheal deviation present. No thyromegaly present.  Cardiovascular: Normal rate, regular rhythm and normal heart sounds.   No murmur heard. Pulmonary/Chest:  Effort normal and breath sounds normal. No respiratory distress. She has no wheezes. She has no rales. She exhibits no tenderness.    Abdominal: Soft. Bowel sounds are normal. She exhibits no distension. There is no tenderness.  Musculoskeletal: Normal range of motion. She exhibits no edema and no tenderness.  Lymphadenopathy:    She has no cervical adenopathy.  Neurological: She is alert and oriented to person, place, and time. She exhibits abnormal muscle tone. Coordination normal.  Skin: Skin is dry. No rash noted. No erythema. No pallor.  Psychiatric: Her behavior is normal. Judgment and thought content normal.       Anxious, flattened affect, indecisive today.    Data Reviewed I have reviewed the ultrasound report, the pathology report, the mammograms that were performed following clip placement, and I have requested the mammogram report from [facility]    Focal area of architectural distortion left breast, 10:00 position. This is an abnormal finding and tissue diagnosis is strongly recommended.  Chronic anxiety and depression  Coronary artery disease, s/p angioplasty and stint 2008  Hypertension  Obesity.     Plan    The patient was strongly advised to undergo excisional biopsy of the area of architectural distortion with needle localization. At this time she is unable to make a decision about this and will discuss it with family and friends.  She is aware of the differential diagnosis which includes malignancy.  She will call me back in the next few days to let you know her decision.  She agrees to return to see me in 6 months with followup mammograms if she decides not to have the biopsy.       Mayerli Kirst M 04/09/2011, 3:12 PM

## 2011-04-09 NOTE — Patient Instructions (Signed)
Your mammograms of your left breast are abnormal, and show an area of distortion in the upper inner quadrant of the left breast. The recent image guided biopsy shows a benign breast tissue and calcifications, but this does not correlate with the x-ray findings. This area needs to be excised to rule out cancer. Today we have advised you to have this area excised with a needle localized biopsy. You have stated that he want to go home and think about this and call me back and let me know what your decision he is. If you decide to have the surgery we will schedule it in the near future. If you decide to hold off on the surgery I think that we need to repeat the mammogram in 6 months and see me at that time.

## 2011-04-10 ENCOUNTER — Other Ambulatory Visit (INDEPENDENT_AMBULATORY_CARE_PROVIDER_SITE_OTHER): Payer: Self-pay | Admitting: General Surgery

## 2011-04-10 ENCOUNTER — Encounter (INDEPENDENT_AMBULATORY_CARE_PROVIDER_SITE_OTHER): Payer: Self-pay | Admitting: General Surgery

## 2011-04-10 DIAGNOSIS — R928 Other abnormal and inconclusive findings on diagnostic imaging of breast: Secondary | ICD-10-CM

## 2011-04-10 DIAGNOSIS — N6320 Unspecified lump in the left breast, unspecified quadrant: Secondary | ICD-10-CM

## 2011-04-28 ENCOUNTER — Encounter: Payer: Self-pay | Admitting: Internal Medicine

## 2011-05-14 ENCOUNTER — Other Ambulatory Visit (INDEPENDENT_AMBULATORY_CARE_PROVIDER_SITE_OTHER): Payer: Self-pay | Admitting: General Surgery

## 2011-05-14 ENCOUNTER — Encounter (HOSPITAL_COMMUNITY)
Admission: RE | Admit: 2011-05-14 | Discharge: 2011-05-14 | Disposition: A | Discharge status: Home / Self Care | Payer: Medicare Other | Source: Ambulatory Visit | Attending: General Surgery | Admitting: General Surgery

## 2011-05-14 DIAGNOSIS — N63 Unspecified lump in unspecified breast: Secondary | ICD-10-CM

## 2011-05-14 LAB — CBC
Platelets: 172 10*3/uL (ref 150–400)
RBC: 5.34 MIL/uL — ABNORMAL HIGH (ref 3.87–5.11)
RDW: 14.4 % (ref 11.5–15.5)
WBC: 12.4 10*3/uL — ABNORMAL HIGH (ref 4.0–10.5)

## 2011-05-14 LAB — COMPREHENSIVE METABOLIC PANEL
ALT: 16 U/L (ref 0–35)
AST: 18 U/L (ref 0–37)
Albumin: 3.9 g/dL (ref 3.5–5.2)
Alkaline Phosphatase: 72 U/L (ref 39–117)
CO2: 26 mEq/L (ref 19–32)
Chloride: 105 mEq/L (ref 96–112)
GFR calc non Af Amer: 80 mL/min — ABNORMAL LOW (ref >90)
Potassium: 4.7 mEq/L (ref 3.5–5.1)
Total Bilirubin: 0.3 mg/dL (ref 0.3–1.2)

## 2011-05-14 LAB — DIFFERENTIAL
Basophils Absolute: 0.1 10*3/uL (ref 0.0–0.1)
Basophils Relative: 0 % (ref 0–1)
Eosinophils Absolute: 0.4 10*3/uL (ref 0.0–0.7)
Eosinophils Relative: 3 % (ref 0–5)
Monocytes Absolute: 0.7 10*3/uL (ref 0.1–1.0)

## 2011-05-14 LAB — URINALYSIS, ROUTINE W REFLEX MICROSCOPIC
Bilirubin Urine: NEGATIVE
Glucose, UA: NEGATIVE mg/dL
Nitrite: NEGATIVE
Specific Gravity, Urine: 1.017 (ref 1.005–1.030)
pH: 6 (ref 5.0–8.0)

## 2011-05-14 LAB — URINE MICROSCOPIC-ADD ON

## 2011-05-14 LAB — SURGICAL PCR SCREEN: Staphylococcus aureus: NEGATIVE

## 2011-05-15 ENCOUNTER — Telehealth (INDEPENDENT_AMBULATORY_CARE_PROVIDER_SITE_OTHER): Payer: Self-pay

## 2011-05-15 ENCOUNTER — Ambulatory Visit (AMBULATORY_SURGERY_CENTER): Payer: Medicare Other | Admitting: *Deleted

## 2011-05-15 VITALS — Ht 65.0 in | Wt 207.1 lb

## 2011-05-15 DIAGNOSIS — K921 Melena: Secondary | ICD-10-CM

## 2011-05-15 NOTE — Telephone Encounter (Signed)
Ebony Hail calling from Short Stay about the pt needing cardiac clearanc before her surgery on 05-19-11 by Dr Dalbert Batman. Per Dr Sherren Kerns the anesthesiologist requesting the pt to get clearance b/c reviewing her hx she last saw Dr [name] from [place] in '09 with some chest pain and stress test was supposed to be done but no record of the stress test. The pt's EKG for her sx on 05-19-11 is normal per Ebony Hail but Dr Sherren Kerns still wants clearance b/c pt's hx. They did try to call the pt's PCP left message with them and did try to reach the pt but had to leave a message. I notified Cindy Dr Darrel Hoover nurse and she is working on this./ [facility]

## 2011-05-15 NOTE — Telephone Encounter (Signed)
Abn labs to Dr Redmond Pulling to review since HMI off.

## 2011-05-15 NOTE — Progress Notes (Signed)
Patient direct for colonoscopy for blood in stool. She also is having heart stress test tues.05-19-11 to clear her for upcoming surgery for lumpectomy. Patient has not seen Dr.Brodie since 2003. She also states she has acid reflux. Spoke with Regina,RN about patient.  Made patient office visit with Dr.Brodie before having colonoscopy. We should also have the stress test results back before office visit.

## 2011-05-15 NOTE — Telephone Encounter (Signed)
I have spoken with Dr [name] office. Pt has not been seen in 4 years and will require new pt appt to have clearance work up. I have set pt up for 10-23 at 3:30 for this appt. Pt aware and surgery cx with Katie until we can get clearance.

## 2011-05-18 ENCOUNTER — Telehealth (INDEPENDENT_AMBULATORY_CARE_PROVIDER_SITE_OTHER): Payer: Self-pay

## 2011-05-18 NOTE — Telephone Encounter (Signed)
Pre op labs reviewed by Dr Dalbert Batman. No action per hmi. Surgery to be r/s after cardiac clearance.

## 2011-05-19 ENCOUNTER — Encounter: Payer: Self-pay | Admitting: Physician Assistant

## 2011-05-19 ENCOUNTER — Ambulatory Visit (HOSPITAL_COMMUNITY): Admission: RE | Admit: 2011-05-19 | Payer: Medicare Other | Source: Ambulatory Visit | Admitting: General Surgery

## 2011-05-19 ENCOUNTER — Ambulatory Visit (INDEPENDENT_AMBULATORY_CARE_PROVIDER_SITE_OTHER): Payer: Medicare Other | Admitting: Physician Assistant

## 2011-05-19 VITALS — BP 127/75 | HR 73 | Ht 65.0 in | Wt 210.0 lb

## 2011-05-19 DIAGNOSIS — E1169 Type 2 diabetes mellitus with other specified complication: Secondary | ICD-10-CM | POA: Insufficient documentation

## 2011-05-19 DIAGNOSIS — R0602 Shortness of breath: Secondary | ICD-10-CM | POA: Insufficient documentation

## 2011-05-19 DIAGNOSIS — N63 Unspecified lump in unspecified breast: Secondary | ICD-10-CM | POA: Insufficient documentation

## 2011-05-19 DIAGNOSIS — I6529 Occlusion and stenosis of unspecified carotid artery: Secondary | ICD-10-CM

## 2011-05-19 DIAGNOSIS — J449 Chronic obstructive pulmonary disease, unspecified: Secondary | ICD-10-CM

## 2011-05-19 DIAGNOSIS — F17200 Nicotine dependence, unspecified, uncomplicated: Secondary | ICD-10-CM

## 2011-05-19 DIAGNOSIS — R079 Chest pain, unspecified: Secondary | ICD-10-CM

## 2011-05-19 DIAGNOSIS — Z720 Tobacco use: Secondary | ICD-10-CM | POA: Insufficient documentation

## 2011-05-19 DIAGNOSIS — K921 Melena: Secondary | ICD-10-CM | POA: Insufficient documentation

## 2011-05-19 DIAGNOSIS — E785 Hyperlipidemia, unspecified: Secondary | ICD-10-CM

## 2011-05-19 DIAGNOSIS — Z01810 Encounter for preprocedural cardiovascular examination: Secondary | ICD-10-CM

## 2011-05-19 DIAGNOSIS — I10 Essential (primary) hypertension: Secondary | ICD-10-CM

## 2011-05-19 DIAGNOSIS — I2510 Atherosclerotic heart disease of native coronary artery without angina pectoris: Secondary | ICD-10-CM

## 2011-05-19 DIAGNOSIS — I209 Angina pectoris, unspecified: Secondary | ICD-10-CM | POA: Insufficient documentation

## 2011-05-19 MED ORDER — METOPROLOL TARTRATE 25 MG PO TABS: 25.0000 mg | ORAL_TABLET | Freq: Two times a day (BID) | ORAL | Status: DC

## 2011-05-19 NOTE — Assessment & Plan Note (Signed)
GI evaluation pending.

## 2011-05-19 NOTE — Progress Notes (Signed)
History of Present Illness: Primary Cardiologist: [name]   [name] is a 55 y.o. female presents for surgical clearance.  She has a left breast mass that requires lumpectomy with Dr. Fanny Skates.  She has also had recent hematochezia in the setting of a viral GI illness.  She sees Dr. Delfin Edis next week and may need EGD/colo.  She has not been seen by Dr. [name] since 2008.  She has a h/o CAD, s/p NSTEMI in 4/08.  She underwent PCI with BMS to the dRCA.  She also had an occluded OM but attempted PCI was unsuccessful.  Otherwise, she had non obstructive CAD and normal LVF.  When last seen by Dr. [name], she was to have a stress myoview due to recent chest pain.  She never followed up.  She has a h/o DOE and exertional chest tightness.  This is stable over the last 2 years and she attributes to her lung disease.  She uses albuterol occasionally.  She does exert at least 4 METs.  She does have symptoms with this amount of activity.  She denies syncope, orthopnea, PND or edema.  No palpitations.  No arm or jaw pain or nausea/diaphoresis associated with chest pain.  She has not used NTG.    Past Medical History  Diagnosis Date  . Depression with anxiety   . Hypertension   . Hyperlipidemia   . CAD (coronary artery disease)     NSTEMI 4/08: pLAD 30%, pCFX 40%, OM3 occluded (PCI unsuccessful), mRCA 50%, dRCA 95% treated with BMS, inf HK, EF 50%  . COPD (chronic obstructive pulmonary disease)   . Carotid stenosis     dopplers 5/08: 0-30% bilateral    Current Outpatient Prescriptions  Medication Sig Dispense Refill  . albuterol (PROVENTIL) (5 MG/ML) 0.5% nebulizer solution Take 2.5 mg by nebulization every 6 (six) hours as needed.        . ALPRAZolam (XANAX) 0.5 MG tablet Take 1 tab as directed       . aspirin 325 MG tablet Take 325 mg by mouth daily.        . B Complex-C (SUPER B COMPLEX PO) Take 1 tablet by mouth daily.        . clonazePAM (KLONOPIN) 0.5 MG tablet Take 1  mg by mouth at bedtime as needed.       . fish oil-omega-3 fatty acids 1000 MG capsule Take 3 g by mouth daily.       [name] lisinopril (PRINIVIL,ZESTRIL) 5 MG tablet Take 5 mg by mouth daily.        . metoprolol tartrate (LOPRESSOR) 25 MG tablet Take 1 tablet (25 mg total) by mouth 2 (two) times daily.  60 tablet  11  . PARoxetine (PAXIL) 20 MG tablet 3 tabs daily       . pravastatin (PRAVACHOL) 40 MG tablet Take 40 mg by mouth daily.        [name] DISCONTD: metoprolol tartrate (LOPRESSOR) 25 MG tablet Take 25 mg by mouth 1 day or 1 dose.         Allergies: Allergies  Allergen Reactions  . Food Other (See Comments)    Nutmeg= difficulty breathing   Social hx:  She continues to smoke cigarettes.  She is disabled.  She lives in [place].  Family Hx:  + CAD (father died in his 71s)  ROS:  Please see the history of present illness.  She has had a recent URI.  Still coughing.  No  fever.  Diarrhea resolved.  Notes dark stools.  No dysuria, hematuria, weight changes, speech difficulty or unilateral weakness.  All other systems reviewed and negative.   Vital Signs: BP 127/75  Pulse 73  Ht 5\' 5"  (1.651 m)  Wt 210 lb (95.255 kg)  BMI 34.95 kg/m2  PHYSICAL EXAM: Well nourished, well developed, in no acute distress HEENT: normal Neck: no JVD Vascular: right carotid bruit Endocrine: no thyromegaly Cardiac:  normal S1, S2; RRR; no murmur Lungs:  clear to auscultation bilaterally, no wheezing, rhonchi or rales Abd: soft, nontender, no hepatomegaly Ext: no edema Skin: warm and dry Neuro:  CNs 2-12 intact, no focal abnormalities noted Psych: normal affect  EKG:  NSR, HR 73, normal axis, PRWP, NSSTTW changes, no significant change when compared with prior tracing.  ASSESSMENT AND PLAN:

## 2011-05-19 NOTE — Patient Instructions (Signed)
Your physician wants you to follow-up in: [place]. You will receive a reminder letter in the mail two months in advance. If you don't receive a letter, please call our office to schedule the follow-up appointment.   Your physician has requested that you have a carotid duplex DX 433.10 CAROTID ARTERY DISEASE.  This test is an ultrasound of the carotid arteries in your neck. It looks at blood flow through these arteries that supply the brain with blood. Allow one hour for this exam. There are no restrictions or special instructions.  Your physician has requested that you have en exercise stress myoview DX 786.50 CHEST PAIN, 786.05 SOB, 414.01 CAD. For further information please visit [email or web address]. Please follow instruction sheet, as given.   Your physician has recommended you make the following change in your medication: INCREASE LOPRESSOR 25 MG 1 TABLET TWICE DAILY

## 2011-05-19 NOTE — Assessment & Plan Note (Signed)
Management per PCP

## 2011-05-19 NOTE — Assessment & Plan Note (Signed)
Controlled.  Continue current therapy.  

## 2011-05-19 NOTE — Assessment & Plan Note (Signed)
Atypical.  Proceed with myoview as noted.

## 2011-05-19 NOTE — Assessment & Plan Note (Signed)
Managed by PCP.  Goal LDL < 70.

## 2011-05-19 NOTE — Assessment & Plan Note (Signed)
She is overdue for carotid dopplers.  This will be arranged.

## 2011-05-19 NOTE — Assessment & Plan Note (Signed)
We discussed the need for cessation.

## 2011-05-19 NOTE — Assessment & Plan Note (Signed)
Lumpectomy pending.

## 2011-05-19 NOTE — Assessment & Plan Note (Addendum)
She is 4 years out from her NSTEMI and PCI.  She has fairly stable chest pain and dyspnea with exertion.  I suspect this is all related to her COPD and ongoing tobacco abuse.  However, with her upcoming surgery, I have recommended proceeding with ETT-myoview prior to giving her final clearance.  If her [name] is low risk, she can proceed with her surgery.  She will continue ASA and statin.  She should remain on metoprolol.  This should be taken BID and she will make that adjustment.  She should remain on beta blocker therapy throughout the perioperative period to reduce risk of cardiovascular complications.    The patient's Myoview stress test results were reviewed. She has significant inferolateral ischemia. I recommended proceeding with cardiac catheterization since she has undergone previous right coronary artery stenting. The patient has upcoming surgery and she does experience angina with exertion. I discussed this with the patient over the telephone. She understands risks, indication, and alternatives to cardiac catheterization plus or minus PCI. She agrees to proceed.

## 2011-05-20 ENCOUNTER — Encounter (INDEPENDENT_AMBULATORY_CARE_PROVIDER_SITE_OTHER): Payer: Medicare Other | Admitting: Cardiology

## 2011-05-20 ENCOUNTER — Ambulatory Visit (HOSPITAL_COMMUNITY): Payer: Medicare Other | Attending: Cardiovascular Disease | Admitting: Radiology

## 2011-05-20 DIAGNOSIS — R079 Chest pain, unspecified: Secondary | ICD-10-CM

## 2011-05-20 DIAGNOSIS — I6529 Occlusion and stenosis of unspecified carotid artery: Secondary | ICD-10-CM

## 2011-05-20 DIAGNOSIS — R0602 Shortness of breath: Secondary | ICD-10-CM | POA: Insufficient documentation

## 2011-05-20 DIAGNOSIS — R0789 Other chest pain: Secondary | ICD-10-CM

## 2011-05-20 DIAGNOSIS — I2510 Atherosclerotic heart disease of native coronary artery without angina pectoris: Secondary | ICD-10-CM

## 2011-05-20 MED ORDER — TECHNETIUM TC 99M TETROFOSMIN IV KIT
11.0000 | PACK | Freq: Once | INTRAVENOUS | Status: AC | PRN
  Administered 2011-05-20: 11 via INTRAVENOUS

## 2011-05-20 MED ORDER — REGADENOSON 0.4 MG/5ML IV SOLN
0.4000 mg | Freq: Once | INTRAVENOUS | Status: AC
  Administered 2011-05-20: 0.4 mg via INTRAVENOUS

## 2011-05-20 MED ORDER — TECHNETIUM TC 99M TETROFOSMIN IV KIT
33.0000 | PACK | Freq: Once | INTRAVENOUS | Status: AC | PRN
  Administered 2011-05-20: 33 via INTRAVENOUS

## 2011-05-20 NOTE — Progress Notes (Signed)
[place] [address] [place] [address] [phone]  Cardiology Nuclear Med Study  [name] is a 55 y.o. female [id number] [birth year]   Nuclear Med Background Indication for Stress Test:  Evaluation for Ischemia, PTCA/Stent Patency and Pending (L) Breast Surgery by Dr. Fanny Reid History:  COPD and '08 MI>PTCA/Stent-RCA, failed PCI-Totalled CFX, EF=50% Cardiac Risk Factors: Carotid Disease, Family History - CAD, Hypertension, Lipids, Obesity and Smoker  Symptoms:  Chest Tightness with Exertion (last episode of chest discomfort was about 2-weeks ago), Dizziness, DOE, Fatigue, Near Syncope and Palpitations   Nuclear Pre-Procedure Caffeine/Decaff Intake:  None NPO After: 10:30pm   Lungs:  Clear. IV 0.9% NS with Angio Cath:  20g  IV Site: R Forearm  IV Started by:  [name], EMT-P  Chest Size (in):  42 Cup Size: C  Height: 5\' 5"  (1.651 m)  Weight:  206 lb (93.441 kg)  BMI:  Body mass index is 34.28 kg/(m^2). Tech Comments:  Metoprolol held > 24 hours, per patient.    Nuclear Med Study 1 or 2 day study: 1 day  Stress Test Type:  Treadmill/Lexiscan  Reading MD: Tracy Argyle, MD  Order Authorizing Provider:  [name], MD; Tracy Dopp, PA-C  Resting Radionuclide: Technetium 59m Tetrofosmin  Resting Radionuclide Dose: 11 mCi   Stress Radionuclide:  Technetium 71m Tetrofosmin  Stress Radionuclide Dose: 33 mCi           Stress Protocol Rest HR: 68 Stress HR: 122  Rest BP: Sitting 124/73; Standing 122/75 Stress BP: 165/97  Exercise Time (min): 5:01 METS: 4.6   Predicted Max HR: 166 bpm % Max HR: 73.49 bpm Rate Pressure Product: 20130   Dose of Adenosine (mg):  n/a Dose of Lexiscan: 0.4 mg  Dose of Atropine (mg): n/a Dose of Dobutamine: n/a mcg/kg/min (at max HR)  Stress Test Technologist: Letta Moynahan, CMA-N  Nuclear Technologist:  Charlton Amor, CNMT     Rest Procedure:  Myocardial perfusion imaging was  performed at rest 45 minutes following the intravenous administration of Technetium 84m Tetrofosmin.  Rest ECG: Nonspecific T-wave changes.  Stress Procedure: The patient attempted to walk the treadmill utilizing the Bruce protocol, but was unable to reach her target heart rate due to fatigue and dizziness.   She was then given IV Lexiscan 0.4 mg over 15-seconds with concurrent low level exercise and then Technetium 81m Tetrofosmin was injected at 30-seconds while the patient continued walking one more minute.  There were nonspecific ST-T wave changes, occasional PVC's and PAC's with Lexiscan.  She c/o nausea and dizziness with infusion.  This was resolved after about 10-minutes.  Quantitative spect images were obtained after a 45-minute delay.  [name], RT-N discussed images and EKG's with Tracy Dopp, PA-C and he said he would talk with Dr. [name] about the results and call the patient; OK for patient to leave.   Stress ECG: No significant change from baseline ECG  QPS Raw Data Images:  Patient motion noted; appropriate software correction applied. Stress Images:  Moderately severe decrease in uptake in the base/mid inferolateral wall and the base/mid/apical lateral wall. Rest Images:  Normal homogeneous uptake in all areas of the myocardium. Subtraction (SDS):  Inferolateral ischemia. Transient Ischemic Dilatation (Normal <1.22):  1.24 Lung/Heart Ratio (Normal <0.45):  .34  Quantitative Gated Spect Images QGS EDV:  93 ml QGS ESV:  27 ml QGS cine images:  Some hypokinesis of the lateral wall. QGS EF: 71%  Impression  Exercise Capacity:  Lexiscan with low level exercise. BP Response:  Normal blood pressure response. Clinical Symptoms:  nausea and dizzy ECG Impression:  No significant ST segment change suggestive of ischemia. Comparison with Prior Nuclear Study: No previous nuclear study performed  Overall Impression:  Abnormal study. There is inferolateral ischemia as outlined  above.  [name]

## 2011-05-25 ENCOUNTER — Telehealth: Payer: Self-pay | Admitting: Cardiovascular Disease

## 2011-05-25 NOTE — Telephone Encounter (Signed)
I spoke with the pt and made her aware that she has to have her labs drawn within 7 days prior to procedure.

## 2011-05-25 NOTE — Telephone Encounter (Signed)
Pt calling wanting to know if she needs to have lab work done considering she just had lab work done while in the hospital the week before last. Please return pt call to discuss further.

## 2011-05-27 ENCOUNTER — Ambulatory Visit: Payer: Medicare Other | Admitting: Internal Medicine

## 2011-05-29 ENCOUNTER — Other Ambulatory Visit: Payer: Medicare Other | Admitting: Internal Medicine

## 2011-06-01 ENCOUNTER — Ambulatory Visit (INDEPENDENT_AMBULATORY_CARE_PROVIDER_SITE_OTHER): Payer: Medicare Other | Admitting: *Deleted

## 2011-06-01 DIAGNOSIS — I10 Essential (primary) hypertension: Secondary | ICD-10-CM

## 2011-06-01 DIAGNOSIS — I2510 Atherosclerotic heart disease of native coronary artery without angina pectoris: Secondary | ICD-10-CM

## 2011-06-01 DIAGNOSIS — E785 Hyperlipidemia, unspecified: Secondary | ICD-10-CM

## 2011-06-01 LAB — CBC WITH DIFFERENTIAL/PLATELET
Basophils Absolute: 0 10*3/uL (ref 0.0–0.1)
Basophils Relative: 0.4 % (ref 0.0–3.0)
Eosinophils Absolute: 0.3 10*3/uL (ref 0.0–0.7)
Lymphocytes Relative: 13.1 % (ref 12.0–46.0)
MCHC: 33.6 g/dL (ref 30.0–36.0)
Neutrophils Relative %: 80.4 % — ABNORMAL HIGH (ref 43.0–77.0)
RBC: 5.11 Mil/uL (ref 3.87–5.11)
RDW: 14.5 % (ref 11.5–14.6)

## 2011-06-01 LAB — BASIC METABOLIC PANEL
BUN: 13 mg/dL (ref 6–23)
GFR: 70.97 mL/min (ref >60.00)
Potassium: 3.6 mEq/L (ref 3.5–5.1)

## 2011-06-01 LAB — PROTIME-INR
INR: 0.9 ratio (ref 0.8–1.0)
Prothrombin Time: 10.4 s (ref 10.2–12.4)

## 2011-06-03 ENCOUNTER — Inpatient Hospital Stay (HOSPITAL_BASED_OUTPATIENT_CLINIC_OR_DEPARTMENT_OTHER)
Admission: RE | Admit: 2011-06-03 | Discharge: 2011-06-03 | Disposition: A | Discharge status: Home / Self Care | Payer: Medicare Other | Source: Ambulatory Visit | Attending: Cardiovascular Disease | Admitting: Cardiovascular Disease

## 2011-06-03 ENCOUNTER — Encounter (HOSPITAL_BASED_OUTPATIENT_CLINIC_OR_DEPARTMENT_OTHER)
Admission: RE | Disposition: A | Discharge status: Home / Self Care | Payer: Self-pay | Source: Ambulatory Visit | Attending: Cardiovascular Disease

## 2011-06-03 DIAGNOSIS — Z9861 Coronary angioplasty status: Secondary | ICD-10-CM | POA: Insufficient documentation

## 2011-06-03 DIAGNOSIS — I2510 Atherosclerotic heart disease of native coronary artery without angina pectoris: Secondary | ICD-10-CM | POA: Insufficient documentation

## 2011-06-03 DIAGNOSIS — R9439 Abnormal result of other cardiovascular function study: Secondary | ICD-10-CM | POA: Insufficient documentation

## 2011-06-03 DIAGNOSIS — I2582 Chronic total occlusion of coronary artery: Secondary | ICD-10-CM | POA: Insufficient documentation

## 2011-06-03 DIAGNOSIS — N63 Unspecified lump in unspecified breast: Secondary | ICD-10-CM | POA: Insufficient documentation

## 2011-06-03 SURGERY — JV LEFT HEART CATHETERIZATION WITH CORONARY ANGIOGRAM
Anesthesia: Moderate Sedation

## 2011-06-03 MED ORDER — OXYCODONE-ACETAMINOPHEN 5-325 MG PO TABS: 1.0000 | ORAL_TABLET | ORAL | Status: DC | PRN

## 2011-06-03 MED ORDER — ACETAMINOPHEN 325 MG PO TABS: 650.0000 mg | ORAL_TABLET | ORAL | Status: DC | PRN

## 2011-06-03 MED ORDER — ASPIRIN 81 MG PO CHEW: 81.0000 mg | CHEWABLE_TABLET | Freq: Every day | ORAL | Status: DC

## 2011-06-03 MED ORDER — SODIUM CHLORIDE 0.9 % IV SOLN: 4.0000 mg | Freq: Four times a day (QID) | INTRAVENOUS | Status: DC | PRN

## 2011-06-03 MED ORDER — SODIUM CHLORIDE 0.9 % IV SOLN: 1.0000 mL/kg/h | INTRAVENOUS | Status: DC

## 2011-06-03 MED ORDER — DIAZEPAM 2 MG PO TABS: 5.0000 mg | ORAL_TABLET | Freq: Once | ORAL | Status: DC

## 2011-06-03 NOTE — H&P (View-Only) (Signed)
History of Present Illness: Primary Cardiologist: [name]   [name] is a 55 y.o. female presents for surgical clearance.  She has a left breast mass that requires lumpectomy with Dr. Fanny Skates.  She has also had recent hematochezia in the setting of a viral GI illness.  She sees Dr. Delfin Edis next week and may need EGD/colo.  She has not been seen by Dr. [name] since 2008.  She has a h/o CAD, s/p NSTEMI in 4/08.  She underwent PCI with BMS to the dRCA.  She also had an occluded OM but attempted PCI was unsuccessful.  Otherwise, she had non obstructive CAD and normal LVF.  When last seen by Dr. [name], she was to have a stress myoview due to recent chest pain.  She never followed up.  She has a h/o DOE and exertional chest tightness.  This is stable over the last 2 years and she attributes to her lung disease.  She uses albuterol occasionally.  She does exert at least 4 METs.  She does have symptoms with this amount of activity.  She denies syncope, orthopnea, PND or edema.  No palpitations.  No arm or jaw pain or nausea/diaphoresis associated with chest pain.  She has not used NTG.    Past Medical History  Diagnosis Date  . Depression with anxiety   . Hypertension   . Hyperlipidemia   . CAD (coronary artery disease)     NSTEMI 4/08: pLAD 30%, pCFX 40%, OM3 occluded (PCI unsuccessful), mRCA 50%, dRCA 95% treated with BMS, inf HK, EF 50%  . COPD (chronic obstructive pulmonary disease)   . Carotid stenosis     dopplers 5/08: 0-30% bilateral    Current Outpatient Prescriptions  Medication Sig Dispense Refill  . albuterol (PROVENTIL) (5 MG/ML) 0.5% nebulizer solution Take 2.5 mg by nebulization every 6 (six) hours as needed.        . ALPRAZolam (XANAX) 0.5 MG tablet Take 1 tab as directed       . aspirin 325 MG tablet Take 325 mg by mouth daily.        . B Complex-C (SUPER B COMPLEX PO) Take 1 tablet by mouth daily.        . clonazePAM (KLONOPIN) 0.5 MG tablet Take 1  mg by mouth at bedtime as needed.       . fish oil-omega-3 fatty acids 1000 MG capsule Take 3 g by mouth daily.       [name] lisinopril (PRINIVIL,ZESTRIL) 5 MG tablet Take 5 mg by mouth daily.        . metoprolol tartrate (LOPRESSOR) 25 MG tablet Take 1 tablet (25 mg total) by mouth 2 (two) times daily.  60 tablet  11  . PARoxetine (PAXIL) 20 MG tablet 3 tabs daily       . pravastatin (PRAVACHOL) 40 MG tablet Take 40 mg by mouth daily.        [name] DISCONTD: metoprolol tartrate (LOPRESSOR) 25 MG tablet Take 25 mg by mouth 1 day or 1 dose.         Allergies: Allergies  Allergen Reactions  . Food Other (See Comments)    Nutmeg= difficulty breathing   Social hx:  She continues to smoke cigarettes.  She is disabled.  She lives in [place].  Family Hx:  + CAD (father died in his 4s)  ROS:  Please see the history of present illness.  She has had a recent URI.  Still coughing.  No  fever.  Diarrhea resolved.  Notes dark stools.  No dysuria, hematuria, weight changes, speech difficulty or unilateral weakness.  All other systems reviewed and negative.   Vital Signs: BP 127/75  Pulse 73  Ht 5\' 5"  (1.651 m)  Wt 210 lb (95.255 kg)  BMI 34.95 kg/m2  PHYSICAL EXAM: Well nourished, well developed, in no acute distress HEENT: normal Neck: no JVD Vascular: right carotid bruit Endocrine: no thyromegaly Cardiac:  normal S1, S2; RRR; no murmur Lungs:  clear to auscultation bilaterally, no wheezing, rhonchi or rales Abd: soft, nontender, no hepatomegaly Ext: no edema Skin: warm and dry Neuro:  CNs 2-12 intact, no focal abnormalities noted Psych: normal affect  EKG:  NSR, HR 73, normal axis, PRWP, NSSTTW changes, no significant change when compared with prior tracing.  ASSESSMENT AND PLAN:

## 2011-06-03 NOTE — Interval H&P Note (Signed)
History and Physical Interval Note:   06/03/2011   12:19 PM   [name]  has presented today for surgery, with the diagnosis of Chest Pain   The various methods of treatment have been discussed with the patient and family. After consideration of risks, benefits and other options for treatment, the patient has consented to  Procedure(s): [place] [place] as a surgical intervention .  The patients' history has been reviewed, patient examined, no change in status, stable for surgery.  I have reviewed the patients' chart and labs.  Questions were answered to the patient's satisfaction.     [name]  MD    Date of Initial H&P: 05/19/11  History reviewed, patient examined, no change in status, stable for surgery.  This is a 55 year old woman with known coronary artery disease. She requires upcoming noncardiac surgery. She had a preoperative Myoview stress scan to evaluate exertional dyspnea and that showed inferolateral ischemia. She presents today for cardiac catheterization.

## 2011-06-03 NOTE — OR Nursing (Signed)
Bedrest starts at 1305 ends at 78

## 2011-06-03 NOTE — OR Nursing (Signed)
Dr Cooper at bedside to discuss results and treatment plan with pt and family 

## 2011-06-03 NOTE — Progress Notes (Signed)
Cardiac Catheterization Procedure Note  Name: [name] MRN: [id number] DOB: 20-Feb-1956  Procedure: Left Heart Cath, Selective Coronary Angiography, LV angiography  Indication: This is a 55 year old woman with known CAD. She has a history of bare-metal stenting of the right coronary artery in 2008. She has a chronic total occlusion of the left circumflex. The patient underwent preoperative evaluation for lumpectomy and was found to have inferolateral ischemia on a stress Myoview study. She was referred for cardiac catheterization.   Procedural details: The right groin was prepped, draped, and anesthetized with 1% lidocaine. Using modified Seldinger technique, a 4 French sheath was introduced into the right femoral artery. Standard Judkins catheters were used for coronary angiography and left ventriculography. An AL-1 catheter was used to image the right coronary artery as it has a high anterior origin. Catheter exchanges were performed over a guidewire. There were no immediate procedural complications. The patient was transferred to the post catheterization recovery area for further monitoring.  Procedural Findings:  Hemodynamics:     AO 125/59    LV 125/22    Coronary angiography:   Coronary dominance: right    Left mainstem: The left main is widely patent. There is no significant obstructive disease noted. It divides into the LAD, an intermediate branch, and the left circumflex.    Left anterior descending (LAD): The LAD is patent to the left ventricular apex. It is a wraparound LAD. There were 2 moderate caliber diagonal branches with diffuse luminal irregularity but no significant stenoses. The distal LAD after both diagonal branches has an area of 70% stenosis followed by diffuse nonobstructive disease.    Left circumflex (LCx): The proximal circumflex is patent. It tapers into the first OM where there is 70-80% stenosis followed by total occlusion. The distal OM branch fills from  left to left collaterals. This is unchanged from a previous study in 2008. The AV groove circumflex after the obtuse marginal branch is patent.    Right coronary artery (RCA): The mid RCA within the stented segment is totally occluded. The distal branch vessels of the right coronary artery filled from left to right collaterals.   Left ventriculography: Left ventricular systolic function is normal, LVEF is estimated at 55-65%, there is no significant mitral regurgitation   Final Conclusions:   1. Moderately severe distal LAD stenosis 2. Chronic total occlusion of the first OM branch of the left circumflex with left to left collaterals 3. Total occlusion of the mid right coronary artery within the previously stented segment with left to right collaterals 4. Normal left ventricular systolic function without segmental wall motion abnormality.  Recommendations:  This is a difficult situation in a patient who requires noncardiac surgery. She had an abnormal stress test with ischemia in the digit in the distribution of her totally occluded right coronary artery and left circumflex. She has collaterals to both of these vessels, but with ischemia on her stress test I suspect the collaterals are insufficient. Considering percutaneous interventional options, the patient could only have definitive treatment after surgery. It may be best to proceed with surgery in the setting of aggressive medical therapy as a lumpectomy as a low risk operation. Her coronary disease can be treated definitively following surgery. Will review the options with the patient and with colleagues.  [name] 06/03/2011, 1:00 PM

## 2011-06-03 NOTE — Progress Notes (Signed)
Entered info on wrong date, information should be dated for 06/03/2011 @ 1100

## 2011-06-05 ENCOUNTER — Encounter (HOSPITAL_BASED_OUTPATIENT_CLINIC_OR_DEPARTMENT_OTHER): Payer: Self-pay

## 2011-06-09 ENCOUNTER — Other Ambulatory Visit (INDEPENDENT_AMBULATORY_CARE_PROVIDER_SITE_OTHER): Payer: Self-pay | Admitting: General Surgery

## 2011-06-09 ENCOUNTER — Telehealth (INDEPENDENT_AMBULATORY_CARE_PROVIDER_SITE_OTHER): Payer: Self-pay

## 2011-06-09 NOTE — Telephone Encounter (Signed)
Pt notified we have received clearance note and orders are going to surgery schedulers. Pt reminded to d/c aspirin and fish oil 5 days preop.

## 2011-06-19 ENCOUNTER — Encounter (HOSPITAL_COMMUNITY): Payer: Self-pay | Admitting: Pharmacy Technician

## 2011-06-22 ENCOUNTER — Encounter: Payer: Medicare Other | Admitting: Cardiovascular Disease

## 2011-06-24 ENCOUNTER — Encounter (HOSPITAL_COMMUNITY): Payer: Self-pay

## 2011-06-24 ENCOUNTER — Encounter (HOSPITAL_COMMUNITY)
Admission: RE | Admit: 2011-06-24 | Discharge: 2011-06-24 | Disposition: A | Discharge status: Home / Self Care | Payer: Medicare Other | Source: Ambulatory Visit | Attending: General Surgery | Admitting: General Surgery

## 2011-06-24 HISTORY — DX: Gastro-esophageal reflux disease without esophagitis: K21.9

## 2011-06-24 HISTORY — DX: Headache: R51

## 2011-06-24 HISTORY — DX: Chronic kidney disease, unspecified: N18.9

## 2011-06-24 LAB — CBC
HCT: 51.9 % — ABNORMAL HIGH (ref 36.0–46.0)
Hemoglobin: 17.3 g/dL — ABNORMAL HIGH (ref 12.0–15.0)
MCV: 95.8 fL (ref 78.0–100.0)
WBC: 12.7 10*3/uL — ABNORMAL HIGH (ref 4.0–10.5)

## 2011-06-24 LAB — BASIC METABOLIC PANEL
BUN: 9 mg/dL (ref 6–23)
CO2: 25 mEq/L (ref 19–32)
Chloride: 105 mEq/L (ref 96–112)
GFR calc Af Amer: >90 mL/min (ref >90)
Potassium: 4.5 mEq/L (ref 3.5–5.1)

## 2011-06-24 LAB — SURGICAL PCR SCREEN: Staphylococcus aureus: NEGATIVE

## 2011-06-24 NOTE — Progress Notes (Signed)
RECENT STRESS TEST WITH DR [name] DONE 06/04/11 IN [facility]IC, ALSO ONE DONE EARLIER THIS YEAR.

## 2011-06-24 NOTE — Pre-Procedure Instructions (Signed)
20 [name]  06/24/2011   Your procedure is scheduled on:  Tuesday 06/30/11  Report to [facility] at 1015 AM.  Call this number if you have problems the morning of surgery: [phone]   Remember:   Do not eat food:After Midnight.  May have clear liquids: up to 4 Hours before arrival.  UNTIL 600 AM  Clear liquids include soda, tea, black coffee, apple or grape juice, broth.  Take these medicines the morning of surgery with A SIP OF WATER:   ALBUTEROL, XANAX,  METOPROLOL(LOPRESSOR), PAXIL   Do not wear jewelry, make-up or nail polish.  Do not wear lotions, powders, or perfumes. You may wear deodorant.  Do not shave 48 hours prior to surgery.  Do not bring valuables to the hospital.  Contacts, dentures or bridgework may not be worn into surgery.  Leave suitcase in the car. After surgery it may be brought to your room.  For patients admitted to the hospital, checkout time is 11:00 AM the day of discharge.   Patients discharged the day of surgery will not be allowed to drive home.  Name and phone number of your driver:  Special Instructions: CHG Shower Use Special Wash: 1/2 bottle night before surgery and 1/2 bottle morning of surgery.   Please read over the following fact sheets that you were given: Pain Booklet, MRSA Information and Surgical Site Infection Prevention   STOP ASPIRIN, COUMADIN, PLAVIX, EFFIENT, HERBAL MEDS

## 2011-06-25 NOTE — Consult Note (Addendum)
Anesthesia:  Patient is a 55 year old female for left partial mastectomy.  She has a hx of CAD/MI, BMS to RCA '08, HTN, hyperlipidemia, COPD with ongoing smoking, GERD, and anxiety/depression.  She underwent preoperative Cardiac evaluation by Dr. [name] and had an abnormal stress test on 05/21/11 showing inferior lateral ischemia with EF 71%.  She ultimately underwent cardiac cath on 06/03/11 (under Notes tab, 06/03/11 Progress Note) showing:   1. Moderately severe distal LAD stenosis  2. Chronic total occlusion of the first OM branch of the left circumflex with left to left collaterals  3. Total occlusion of the mid right coronary artery within the previously stented segment with left to right collaterals  4. Normal left ventricular systolic function without segmental wall motion abnormality.   According to his note, Dr. [name] is considering percutaneous intervention, but since she is in need for surgery, he feels this can been done after her breast procedure.    Labs, CXR, and EKG noted.  Plan to proceed if no acute CV symptoms.

## 2011-06-29 ENCOUNTER — Other Ambulatory Visit (INDEPENDENT_AMBULATORY_CARE_PROVIDER_SITE_OTHER): Payer: Self-pay | Admitting: General Surgery

## 2011-06-29 MED ORDER — CEFAZOLIN SODIUM 1-5 GM-% IV SOLN: 1.0000 g | INTRAVENOUS | Status: DC

## 2011-06-29 MED ORDER — CEFAZOLIN SODIUM-DEXTROSE 2-3 GM-% IV SOLR
2.0000 g | INTRAVENOUS | Status: AC
  Administered 2011-06-30: 2 g via INTRAVENOUS
  Filled 2011-06-29: qty 50

## 2011-06-29 NOTE — H&P (Signed)
[name]   04/09/2011 2:30 PM Office Visit  MRN: [id number]   Description: 55 year old female  Provider: [name], MD  Department: [facility]        Diagnoses     Abnormal mammogram   - Primary    793.80      Reason for Visit     Other    evaluate abn. left MGM       Reason For Visit History Recorded        Vitals - Last Recorded       BP Pulse Temp(Src) Ht Wt BMI    134/88  72  97.8 F (36.6 C) (Temporal)  5\' 5"  (1.651 m)  214 lb (97.07 kg)  35.61 kg/m2       Progress Notes     [name], MD  04/09/2011  3:22 PM  SignedChief Complaint   Patient presents with   .  Other       evaluate abn. left MGM      HPI [name] is a 55 y.o. female.     This well and was referred to me Dr. Curlene Dolphin for consideration of excision of an area in the left breast at the 10 o'clock position.   The patient has not had any significant breast problems in the past. She had mastitis when she was breast-feeding many years ago at but no other problems. She is not aware of any abnormality of her breast.   She had mammograms at [facility] and we are awaiting those reports. She was referred to the [place] for biopsy because of an area of focal architectural distortion at the 10:00 position of the left breast. Dr. Curlene Dolphin did an ultrasound-guided biopsy of this area and the final pathology report showed benign breast tissue and microcalcifications. Dr. Radford Pax feels that this is not concordant with the imaging findings and has referred the patient for excisional biopsy. The will require a needle localization.   The patient's family history is negative for breast cancer but she thinks her grandmother may have had ovarian cancer.   The patient's other significant medical problems are chronic anxiety and depression, coronary disease with coronary angioplasty and stent [name] in 2008, hypertension, and  hyperlipidemia.   She is on Paxil, clonazepam, alprazolam, aspirin, beta blockers, lisinopril, she is followed at a health clinic in [facility].. She sees Dr. Tommi Rumps that the behavioral health clinic in [place]. . She is not regularly followed by cardiology. HPI    Past Medical History   Diagnosis  Date   .  Depression     .  Hypertension     .  Hyperlipidemia     .  CAD (coronary artery disease)     .  Anxiety     .  COPD (chronic obstructive pulmonary disease)     .  Myocardial infarction     .  Heart attack         Past Surgical History   Procedure  Date   .  Coronary stent placement     .  Cesarean section  1987       Family History   Problem  Relation  Age of Onset   .  Heart disease  Mother     .  Heart disease  Father     .  Cancer  Maternal Aunt     .  Cancer  Maternal Grandmother  ovarian      Social History History   Substance Use Topics   .  Smoking status:  Current Everyday Smoker -- 1.0 packs/day   .  Smokeless tobacco:  Not on file   .  Alcohol Use:  Yes      No Known Allergies    Current Outpatient Prescriptions   Medication  Sig  Dispense  Refill   .  albuterol (PROVENTIL) (5 MG/ML) 0.5% nebulizer solution  Take 2.5 mg by nebulization every 6 (six) hours as needed.           .  ALPRAZolam (XANAX) 0.25 MG tablet  Take 0.25 mg by mouth at bedtime as needed.           [name]  aspirin 325 MG tablet  Take 325 mg by mouth daily.           .  clonazePAM (KLONOPIN) 0.5 MG tablet  Take 0.5 mg by mouth at bedtime as needed.           [name]  lisinopril (PRINIVIL,ZESTRIL) 5 MG tablet  Take 5 mg by mouth daily.           .  metoprolol tartrate (LOPRESSOR) 25 MG tablet  Take 25 mg by mouth 2 (two) times daily.           [name]  PARoxetine (PAXIL) 10 MG tablet  Take 30 mg by mouth every morning.             Review of Systems Review of Systems 12 system review of systems is performed and is negative except as described above.Blood pressure 134/88, pulse 72,  temperature 97.8 F (36.6 C), temperature source Temporal, height 5\' 5"  (1.651 m), weight 214 lb (97.07 kg).   Physical Exam Physical Exam  Constitutional: She is oriented to person, place, and time. She appears well-developed and well-nourished. No distress.  HENT:   Head: Normocephalic and atraumatic.   Nose: Nose normal.   Mouth/Throat: No oropharyngeal exudate.       dentures  Eyes: Conjunctivae are normal. Pupils are equal, round, and reactive to light. Left eye exhibits no discharge. No scleral icterus.  Neck: Normal range of motion. Neck supple. No JVD present. No tracheal deviation present. No thyromegaly present.  Cardiovascular: Normal rate, regular rhythm and normal heart sounds.    No murmur heard. Pulmonary/Chest: Effort normal and breath sounds normal. No respiratory distress. She has no wheezes. She has no rales. She exhibits no tenderness.    Abdominal: Soft. Bowel sounds are normal. She exhibits no distension. There is no tenderness.  Musculoskeletal: Normal range of motion. She exhibits no edema and no tenderness.  Lymphadenopathy:    She has no cervical adenopathy.  Neurological: She is alert and oriented to person, place, and time. She exhibits abnormal muscle tone. Coordination normal.  Skin: Skin is dry. No rash noted. No erythema. No pallor.  Psychiatric: Her behavior is normal. Judgment and thought content normal.       Anxious, flattened affect, indecisive today.    Data Reviewed I have reviewed the ultrasound report, the pathology report, the mammograms that were performed following clip placement, and I have requested the mammogram report from [place] Focal area of architectural distortion left breast, 10:00 position. This is an abnormal finding and tissue diagnosis is strongly recommended.   Chronic anxiety and depression   Coronary artery disease, s/p angioplasty and stint  2008   Hypertension   Obesity.  Plan The patient was strongly advised to undergo excisional biopsy of the area of architectural distortion with needle localization. At this time she is unable to make a decision about this and will discuss it with family and friends.   She is aware of the differential diagnosis which includes malignancy.   She will call me back in the next few days to let you know her decision.   She agrees to return to see me in 6 months with followup mammograms if she decides not to have the biopsy.   Zanae Kuehnle M 04/09/2011, 3:10 pm       Patient Instructions     Your mammograms of your left breast are abnormal, and show an area of distortion in the upper inner quadrant of the left breast. The recent image guided biopsy shows a benign breast tissue and calcifications, but this does not correlate with the x-ray findings. This area needs to be excised to rule out cancer. Today we have advised you to have this area excised with a needle localized biopsy. You have stated that he want to go home and think about this and call me back and let me know what your decision he is. If you decide to have the surgery we will schedule it in the near future. If you decide to hold off on the surgery I think that we need to repeat the mammogram in 6 months and see me at that time.                    Other Encounter Related Information     Allergies & Medications         Problem List         History         Patient-Entered Questionnaires     No data filed

## 2011-06-30 ENCOUNTER — Ambulatory Visit
Admission: RE | Admit: 2011-06-30 | Discharge: 2011-06-30 | Disposition: A | Discharge status: Home / Self Care | Payer: Medicare Other | Source: Ambulatory Visit | Attending: General Surgery | Admitting: General Surgery

## 2011-06-30 ENCOUNTER — Other Ambulatory Visit (INDEPENDENT_AMBULATORY_CARE_PROVIDER_SITE_OTHER): Payer: Self-pay | Admitting: General Surgery

## 2011-06-30 ENCOUNTER — Encounter (HOSPITAL_COMMUNITY): Payer: Self-pay | Admitting: Vascular Surgery

## 2011-06-30 ENCOUNTER — Ambulatory Visit (HOSPITAL_COMMUNITY): Payer: Medicare Other | Admitting: Vascular Surgery

## 2011-06-30 ENCOUNTER — Ambulatory Visit (HOSPITAL_COMMUNITY)
Admission: RE | Admit: 2011-06-30 | Discharge: 2011-06-30 | Disposition: A | Discharge status: Home / Self Care | Payer: Medicare Other | Source: Ambulatory Visit | Attending: General Surgery | Admitting: General Surgery

## 2011-06-30 ENCOUNTER — Encounter (HOSPITAL_COMMUNITY)
Admission: RE | Disposition: A | Discharge status: Home / Self Care | Payer: Self-pay | Source: Ambulatory Visit | Attending: General Surgery

## 2011-06-30 ENCOUNTER — Encounter (HOSPITAL_COMMUNITY): Payer: Self-pay

## 2011-06-30 DIAGNOSIS — R928 Other abnormal and inconclusive findings on diagnostic imaging of breast: Secondary | ICD-10-CM

## 2011-06-30 DIAGNOSIS — C50919 Malignant neoplasm of unspecified site of unspecified female breast: Secondary | ICD-10-CM | POA: Insufficient documentation

## 2011-06-30 DIAGNOSIS — Z01812 Encounter for preprocedural laboratory examination: Secondary | ICD-10-CM | POA: Insufficient documentation

## 2011-06-30 DIAGNOSIS — K219 Gastro-esophageal reflux disease without esophagitis: Secondary | ICD-10-CM | POA: Insufficient documentation

## 2011-06-30 DIAGNOSIS — I209 Angina pectoris, unspecified: Secondary | ICD-10-CM | POA: Insufficient documentation

## 2011-06-30 DIAGNOSIS — N6320 Unspecified lump in the left breast, unspecified quadrant: Secondary | ICD-10-CM

## 2011-06-30 DIAGNOSIS — R0602 Shortness of breath: Secondary | ICD-10-CM | POA: Insufficient documentation

## 2011-06-30 DIAGNOSIS — R51 Headache: Secondary | ICD-10-CM | POA: Insufficient documentation

## 2011-06-30 DIAGNOSIS — N63 Unspecified lump in unspecified breast: Secondary | ICD-10-CM

## 2011-06-30 DIAGNOSIS — I2510 Atherosclerotic heart disease of native coronary artery without angina pectoris: Secondary | ICD-10-CM | POA: Insufficient documentation

## 2011-06-30 DIAGNOSIS — I252 Old myocardial infarction: Secondary | ICD-10-CM | POA: Insufficient documentation

## 2011-06-30 DIAGNOSIS — I10 Essential (primary) hypertension: Secondary | ICD-10-CM | POA: Insufficient documentation

## 2011-06-30 HISTORY — PX: BREAST LUMPECTOMY: SHX2

## 2011-06-30 HISTORY — PX: MASTECTOMY PARTIAL / LUMPECTOMY: SUR851

## 2011-06-30 HISTORY — DX: Malignant neoplasm of unspecified site of unspecified female breast: C50.919

## 2011-06-30 SURGERY — BREAST BIOPSY WITH NEEDLE LOCALIZATION
Anesthesia: General | Site: Breast | Laterality: Left | Wound class: Clean

## 2011-06-30 MED ORDER — MIDAZOLAM HCL 5 MG/5ML IJ SOLN
INTRAMUSCULAR | Status: DC | PRN
  Administered 2011-06-30: 1 mg via INTRAVENOUS

## 2011-06-30 MED ORDER — LACTATED RINGERS IV SOLN
INTRAVENOUS | Status: DC
  Administered 2011-06-30: 13:00:00 via INTRAVENOUS

## 2011-06-30 MED ORDER — GLYCOPYRROLATE 0.2 MG/ML IJ SOLN
INTRAMUSCULAR | Status: DC | PRN
  Administered 2011-06-30: 0.2 mg via INTRAVENOUS
  Administered 2011-06-30: .4 mg via INTRAVENOUS

## 2011-06-30 MED ORDER — HYDROCODONE-ACETAMINOPHEN 5-325 MG PO TABS: 1.0000 | ORAL_TABLET | ORAL | Status: AC | PRN

## 2011-06-30 MED ORDER — ONDANSETRON HCL 4 MG/2ML IJ SOLN
INTRAMUSCULAR | Status: DC | PRN
  Administered 2011-06-30: 4 mg via INTRAVENOUS

## 2011-06-30 MED ORDER — HYDROMORPHONE HCL PF 1 MG/ML IJ SOLN
0.2500 mg | INTRAMUSCULAR | Status: DC | PRN
  Administered 2011-06-30: 0.25 mg via INTRAVENOUS

## 2011-06-30 MED ORDER — MORPHINE SULFATE 2 MG/ML IJ SOLN: 0.0500 mg/kg | INTRAMUSCULAR | Status: DC | PRN

## 2011-06-30 MED ORDER — PROMETHAZINE HCL 25 MG/ML IJ SOLN: 6.2500 mg | INTRAMUSCULAR | Status: DC | PRN

## 2011-06-30 MED ORDER — MEPERIDINE HCL 25 MG/ML IJ SOLN: 6.2500 mg | INTRAMUSCULAR | Status: DC | PRN

## 2011-06-30 MED ORDER — NEOSTIGMINE METHYLSULFATE 1 MG/ML IJ SOLN
INTRAMUSCULAR | Status: DC | PRN
  Administered 2011-06-30: 3 mg via INTRAVENOUS

## 2011-06-30 MED ORDER — LACTATED RINGERS IV SOLN
Freq: Once | INTRAVENOUS | Status: AC
  Administered 2011-06-30: 250 mL/h via INTRAVENOUS

## 2011-06-30 MED ORDER — LIDOCAINE-EPINEPHRINE (PF) 1 %-1:200000 IJ SOLN
INTRAMUSCULAR | Status: DC | PRN
  Administered 2011-06-30: 8 mL via INTRADERMAL

## 2011-06-30 MED ORDER — ROCURONIUM BROMIDE 100 MG/10ML IV SOLN
INTRAVENOUS | Status: DC | PRN
  Administered 2011-06-30: 30 mg via INTRAVENOUS

## 2011-06-30 MED ORDER — FENTANYL CITRATE 0.05 MG/ML IJ SOLN
INTRAMUSCULAR | Status: DC | PRN
  Administered 2011-06-30: 50 ug via INTRAVENOUS
  Administered 2011-06-30: 100 ug via INTRAVENOUS

## 2011-06-30 MED ORDER — LACTATED RINGERS IV SOLN
INTRAVENOUS | Status: DC | PRN
  Administered 2011-06-30: 13:00:00 via INTRAVENOUS

## 2011-06-30 MED ORDER — MIDAZOLAM HCL 2 MG/2ML IJ SOLN
0.5000 mg | Freq: Once | INTRAMUSCULAR | Status: DC | PRN
Start: 2011-06-30 — End: 2011-06-30

## 2011-06-30 MED ORDER — EPHEDRINE SULFATE 50 MG/ML IJ SOLN
INTRAMUSCULAR | Status: DC | PRN
  Administered 2011-06-30: 10 mg via INTRAVENOUS
  Administered 2011-06-30: 25 mg via INTRAVENOUS
  Administered 2011-06-30: 15 mg via INTRAVENOUS

## 2011-06-30 MED ORDER — ALBUTEROL SULFATE HFA 108 (90 BASE) MCG/ACT IN AERS
INHALATION_SPRAY | RESPIRATORY_TRACT | Status: DC | PRN
  Administered 2011-06-30 (×2): 3 via RESPIRATORY_TRACT

## 2011-06-30 MED ORDER — PROPOFOL 10 MG/ML IV EMUL
INTRAVENOUS | Status: DC | PRN
  Administered 2011-06-30: 170 mg via INTRAVENOUS

## 2011-06-30 SURGICAL SUPPLY — 44 items
ADH SKN CLS APL DERMABOND .7 (GAUZE/BANDAGES/DRESSINGS) ×1
ADH SKN CLS LQ APL DERMABOND (GAUZE/BANDAGES/DRESSINGS) ×1
APPLIER CLIP 9.375 MED OPEN (MISCELLANEOUS)
APR CLP MED 9.3 20 MLT OPN (MISCELLANEOUS)
BINDER BREAST LRG (GAUZE/BANDAGES/DRESSINGS) IMPLANT
BINDER BREAST MEDIUM (GAUZE/BANDAGES/DRESSINGS) ×1 IMPLANT
BINDER BREAST XLRG (GAUZE/BANDAGES/DRESSINGS) ×1 IMPLANT
CANISTER SUCTION 2500CC (MISCELLANEOUS) ×2 IMPLANT
CHLORAPREP W/TINT 26ML (MISCELLANEOUS) ×2 IMPLANT
CLIP APPLIE 9.375 MED OPEN (MISCELLANEOUS) IMPLANT
CLOTH BEACON ORANGE TIMEOUT ST (SAFETY) ×2 IMPLANT
COVER SURGICAL LIGHT HANDLE (MISCELLANEOUS) ×2 IMPLANT
DECANTER SPIKE VIAL GLASS SM (MISCELLANEOUS) ×1 IMPLANT
DERMABOND ADHESIVE PROPEN (GAUZE/BANDAGES/DRESSINGS) ×1
DERMABOND ADVANCED (GAUZE/BANDAGES/DRESSINGS) ×1
DERMABOND ADVANCED .7 DNX12 (GAUZE/BANDAGES/DRESSINGS) ×1 IMPLANT
DERMABOND ADVANCED .7 DNX6 (GAUZE/BANDAGES/DRESSINGS) IMPLANT
DEVICE DUBIN SPECIMEN MAMMOGRA (MISCELLANEOUS) ×2 IMPLANT
DRAPE LAPAROSCOPIC ABDOMINAL (DRAPES) ×2 IMPLANT
DRSG PAD ABDOMINAL 8X10 ST (GAUZE/BANDAGES/DRESSINGS) ×1 IMPLANT
ELECT CAUTERY BLADE 6.4 (BLADE) ×2 IMPLANT
ELECT REM PT RETURN 9FT ADLT (ELECTROSURGICAL) ×2
ELECTRODE REM PT RTRN 9FT ADLT (ELECTROSURGICAL) ×1 IMPLANT
GLOVE EUDERMIC 7 POWDERFREE (GLOVE) ×2 IMPLANT
GOWN PREVENTION PLUS XLARGE (GOWN DISPOSABLE) ×2 IMPLANT
GOWN STRL NON-REIN LRG LVL3 (GOWN DISPOSABLE) ×2 IMPLANT
KIT BASIN OR (CUSTOM PROCEDURE TRAY) ×2 IMPLANT
KIT MARKER MARGIN INK (KITS) ×1 IMPLANT
KIT ROOM TURNOVER OR (KITS) ×2 IMPLANT
NDL HYPO 25GX1X1/2 BEV (NEEDLE) ×1 IMPLANT
NEEDLE HYPO 25GX1X1/2 BEV (NEEDLE) ×2 IMPLANT
NS IRRIG 1000ML POUR BTL (IV SOLUTION) ×2 IMPLANT
PACK GENERAL/GYN (CUSTOM PROCEDURE TRAY) ×2 IMPLANT
PAD ARMBOARD 7.5X6 YLW CONV (MISCELLANEOUS) ×4 IMPLANT
SPONGE LAP 4X18 X RAY DECT (DISPOSABLE) IMPLANT
STAPLER VISISTAT 35W (STAPLE) ×2 IMPLANT
SUT MNCRL AB 4-0 PS2 18 (SUTURE) ×2 IMPLANT
SUT SILK 2 0 SH (SUTURE) ×1 IMPLANT
SUT VIC AB 3-0 SH 18 (SUTURE) ×2 IMPLANT
SUT VICRYL AB 3 0 TIES (SUTURE) IMPLANT
SYR CONTROL 10ML LL (SYRINGE) ×2 IMPLANT
TOWEL OR 17X24 6PK STRL BLUE (TOWEL DISPOSABLE) ×2 IMPLANT
TOWEL OR 17X26 10 PK STRL BLUE (TOWEL DISPOSABLE) ×2 IMPLANT
WATER STERILE IRR 1000ML POUR (IV SOLUTION) IMPLANT

## 2011-06-30 NOTE — Anesthesia Postprocedure Evaluation (Signed)
  Anesthesia Post-op Note  Patient: [name]  Procedure(s) Performed:  BREAST BIOPSY WITH NEEDLE LOCALIZATION - needle localization at breast center [place], left breast lumpectomy    Patient Location: PACU  Anesthesia Type: General  Level of Consciousness: awake  Airway and Oxygen Therapy: Patient Spontanous Breathing  Post-op Pain: mild  Post-op Assessment: Post-op Vital signs reviewed  Post-op Vital Signs: stable  Complications: No apparent anesthesia complications

## 2011-06-30 NOTE — Progress Notes (Signed)
pts teeth and glasses returned.

## 2011-06-30 NOTE — Progress Notes (Signed)
Spoke with Dr. Orene Desanctis about pts BP, pts BP up when taken on right calf and pts readings good, He is ok letting her be discharged.

## 2011-06-30 NOTE — Transfer of Care (Signed)
Immediate Anesthesia Transfer of Care Note  Patient: [name]  Procedure(s) Performed:  BREAST BIOPSY WITH NEEDLE LOCALIZATION - needle localization at breast center [place], left breast lumpectomy    Patient Location: PACU  Anesthesia Type: General  Level of Consciousness: awake, alert , oriented and sedated  Airway & Oxygen Therapy: Patient Spontanous Breathing and Patient connected to face mask oxygen  Post-op Assessment: Report given to PACU RN and Post -op Vital signs reviewed and stable  Post vital signs: Reviewed and stable  Complications: No apparent anesthesia complications

## 2011-06-30 NOTE — Op Note (Signed)
Patient Name:           [name]   Date of Surgery:        06/30/2011  Pre op Diagnosis:      Left breast mass, 10:00 position, indeterminate significance  Post op Diagnosis:    Same   Procedure:                 Left partial mastectomy with a bracketed needle localization  Surgeon:                     Edsel Petrin. Dalbert Batman, M.D., FACS  Assistant:                        Operative Indications:   This is a 55 year old Caucasian female with no past history of breast disease. Recent mammogram showed an area of architectural distortion in the 10:00 position of the left breast. An ultrasound-guided biopsy this area was performed and the final pathology report showed benign breast tissue and microcalcifications. The radiologist felt that this was not concordant with the imaging findings. They were more suspicious and felt they had missed the area in question. The patient was referred for excision. I have evaluated the patient in the office and counseled her regarding biopsy. I agree this area is to be removed. She was seen at the breast center of [facility] this morning. Dr. Glennon Mac placed 2 wires one anteriorly one posteriorly to bracket the area of architectural distortion as well as the marker clip. She is brought to the operating room electively.  Operative Findings:       The specimen mammogram showed the entire area of architectural distortion, the marker clip, and both wires to be contained within the specimen.  Procedure in Detail:          Following the induction of general endotracheal anesthesia the patient's left breast was prepped and draped in sterile fashion. Intravenous antibiotics were given. Surgical time out was held. 1% Xylocaine with epinephrine was used as a local infiltration anesthetic.  A transverse, narrow elliptical incision was made medially in the left breast. This encompassed both wires. The incision was carried medially almost to the areolar margin. Dissection was carried  down into the breast tissue and widely around the wires. The specimen was removed and marked with a 6 color margin marker kit. Specimen mammogram was performed and was felt to be more than adequate with all areas of concern contained within the specimen. The specimen was marked on the grid and sent to pathology. Hemostasis was excellent and achieved with electrocautery. The wound was irrigated with saline. The breast tissue was closed in several layers with interrupted sutures of 3-0 Vicryl and the skin closed with a running subcuticular suture of 4-0 Monocryl and Dermabond. A breast binder was placed and the patient taken to recovery in stable condition. EBL 15 cc. Complications none. Counts correct.     Edsel Petrin. Dalbert Batman, M.D., FACS General and Minimally Invasive Surgery Breast and Colorectal Surgery  06/30/2011 2:07 PM

## 2011-06-30 NOTE — H&P (View-Only) (Signed)
[name]   04/09/2011 2:30 PM Office Visit  MRN: [id number]   Description: 55 year old female  Provider: [name], MD  Department: [facility]        Diagnoses     Abnormal mammogram   - Primary    793.80      Reason for Visit     Other    evaluate abn. left MGM       Reason For Visit History Recorded        Vitals - Last Recorded       BP Pulse Temp(Src) Ht Wt BMI    134/88  72  97.8 F (36.6 C) (Temporal)  5\' 5"  (1.651 m)  214 lb (97.07 kg)  35.61 kg/m2       Progress Notes     [name], MD  04/09/2011  3:22 PM  SignedChief Complaint   Patient presents with   .  Other       evaluate abn. left MGM      HPI [name] is a 55 y.o. female.     This well and was referred to me Dr. Curlene Dolphin for consideration of excision of an area in the left breast at the 10 o'clock position.   The patient has not had any significant breast problems in the past. She had mastitis when she was breast-feeding many years ago at but no other problems. She is not aware of any abnormality of her breast.   She had mammograms at [facility] and we are awaiting those reports. She was referred to the [facility] for biopsy because of an area of focal architectural distortion at the 10:00 position of the left breast. Dr. Curlene Dolphin did an ultrasound-guided biopsy of this area and the final pathology report showed benign breast tissue and microcalcifications. Dr. Radford Pax feels that this is not concordant with the imaging findings and has referred the patient for excisional biopsy. The will require a needle localization.   The patient's family history is negative for breast cancer but she thinks her grandmother may have had ovarian cancer.   The patient's other significant medical problems are chronic anxiety and depression, coronary disease with coronary angioplasty and stent [name] in 2008, hypertension, and  hyperlipidemia.   She is on Paxil, clonazepam, alprazolam, aspirin, beta blockers, lisinopril, she is followed at a health clinic in [facility].. She sees Dr. Tommi Rumps that the behavioral health clinic in [place]. . She is not regularly followed by cardiology. HPI    Past Medical History   Diagnosis  Date   .  Depression     .  Hypertension     .  Hyperlipidemia     .  CAD (coronary artery disease)     .  Anxiety     .  COPD (chronic obstructive pulmonary disease)     .  Myocardial infarction     .  Heart attack         Past Surgical History   Procedure  Date   .  Coronary stent placement     .  Cesarean section  1987       Family History   Problem  Relation  Age of Onset   .  Heart disease  Mother     .  Heart disease  Father     .  Cancer  Maternal Aunt     .  Cancer  Maternal Grandmother  ovarian      Social History History   Substance Use Topics   .  Smoking status:  Current Everyday Smoker -- 1.0 packs/day   .  Smokeless tobacco:  Not on file   .  Alcohol Use:  Yes      No Known Allergies    Current Outpatient Prescriptions   Medication  Sig  Dispense  Refill   .  albuterol (PROVENTIL) (5 MG/ML) 0.5% nebulizer solution  Take 2.5 mg by nebulization every 6 (six) hours as needed.           .  ALPRAZolam (XANAX) 0.25 MG tablet  Take 0.25 mg by mouth at bedtime as needed.           [name]  aspirin 325 MG tablet  Take 325 mg by mouth daily.           .  clonazePAM (KLONOPIN) 0.5 MG tablet  Take 0.5 mg by mouth at bedtime as needed.           [name]  lisinopril (PRINIVIL,ZESTRIL) 5 MG tablet  Take 5 mg by mouth daily.           .  metoprolol tartrate (LOPRESSOR) 25 MG tablet  Take 25 mg by mouth 2 (two) times daily.           [name]  PARoxetine (PAXIL) 10 MG tablet  Take 30 mg by mouth every morning.             Review of Systems Review of Systems 12 system review of systems is performed and is negative except as described above.Blood pressure 134/88, pulse 72,  temperature 97.8 F (36.6 C), temperature source Temporal, height 5\' 5"  (1.651 m), weight 214 lb (97.07 kg).   Physical Exam Physical Exam  Constitutional: She is oriented to person, place, and time. She appears well-developed and well-nourished. No distress.  HENT:   Head: Normocephalic and atraumatic.   Nose: Nose normal.   Mouth/Throat: No oropharyngeal exudate.       dentures  Eyes: Conjunctivae are normal. Pupils are equal, round, and reactive to light. Left eye exhibits no discharge. No scleral icterus.  Neck: Normal range of motion. Neck supple. No JVD present. No tracheal deviation present. No thyromegaly present.  Cardiovascular: Normal rate, regular rhythm and normal heart sounds.    No murmur heard. Pulmonary/Chest: Effort normal and breath sounds normal. No respiratory distress. She has no wheezes. She has no rales. She exhibits no tenderness.    Abdominal: Soft. Bowel sounds are normal. She exhibits no distension. There is no tenderness.  Musculoskeletal: Normal range of motion. She exhibits no edema and no tenderness.  Lymphadenopathy:    She has no cervical adenopathy.  Neurological: She is alert and oriented to person, place, and time. She exhibits abnormal muscle tone. Coordination normal.  Skin: Skin is dry. No rash noted. No erythema. No pallor.  Psychiatric: Her behavior is normal. Judgment and thought content normal.       Anxious, flattened affect, indecisive today.    Data Reviewed I have reviewed the ultrasound report, the pathology report, the mammograms that were performed following clip placement, and I have requested the mammogram report from [place] Focal area of architectural distortion left breast, 10:00 position. This is an abnormal finding and tissue diagnosis is strongly recommended.   Chronic anxiety and depression   Coronary artery disease, s/p angioplasty and stint  2008   Hypertension   Obesity.  Plan The patient was strongly advised to undergo excisional biopsy of the area of architectural distortion with needle localization. At this time she is unable to make a decision about this and will discuss it with family and friends.   She is aware of the differential diagnosis which includes malignancy.   She will call me back in the next few days to let you know her decision.   She agrees to return to see me in 6 months with followup mammograms if she decides not to have the biopsy.   Warrene Kapfer M 04/09/2011, 3:10 pm       Patient Instructions     Your mammograms of your left breast are abnormal, and show an area of distortion in the upper inner quadrant of the left breast. The recent image guided biopsy shows a benign breast tissue and calcifications, but this does not correlate with the x-ray findings. This area needs to be excised to rule out cancer. Today we have advised you to have this area excised with a needle localized biopsy. You have stated that he want to go home and think about this and call me back and let me know what your decision he is. If you decide to have the surgery we will schedule it in the near future. If you decide to hold off on the surgery I think that we need to repeat the mammogram in 6 months and see me at that time.                    Other Encounter Related Information     Allergies & Medications         Problem List         History         Patient-Entered Questionnaires     No data filed

## 2011-06-30 NOTE — Interval H&P Note (Signed)
History and Physical Interval Note:  06/30/2011 12:37 PM  [name]  has presented today for surgery, with the diagnosis of left breast mass  The various methods of treatment have been discussed with the patient and family. After consideration of risks, benefits and other options for treatment, the patient has consented to  Procedure(s): LEFT BREAST BIOPSY WITH NEEDLE LOCALIZATION as a surgical intervention .  The patients' history has been reviewed, patient examined, no change in status, stable for surgery.  I have reviewed the patients' chart and labs.  Questions were answered to the patient's satisfaction.     [name] 06/30/2011 12:38 PM

## 2011-06-30 NOTE — Anesthesia Preprocedure Evaluation (Addendum)
Anesthesia Evaluation  Patient identified by MRN, date of birth, ID band Patient awake    Airway Mallampati: II      Dental  (+) Dental Advisory Given   Pulmonary shortness of breath and with exertion, COPD COPD inhaler,  clear to auscultation        Cardiovascular hypertension, + angina + CAD and + Past MI Regular Normal    Neuro/Psych  Headaches,    GI/Hepatic GERD-  ,  Endo/Other    Renal/GU      Musculoskeletal   Abdominal   Peds  Hematology   Anesthesia Other Findings   Reproductive/Obstetrics                           Anesthesia Physical Anesthesia Plan  ASA: III  Anesthesia Plan: General   Post-op Pain Management:    Induction: Intravenous  Airway Management Planned: Oral ETT  Additional Equipment:   Intra-op Plan:   Post-operative Plan: Extubation in OR  Informed Consent:   Dental advisory given  Plan Discussed with: CRNA  Anesthesia Plan Comments:         Anesthesia Quick Evaluation

## 2011-07-02 ENCOUNTER — Encounter (HOSPITAL_COMMUNITY): Payer: Self-pay | Admitting: General Surgery

## 2011-07-03 ENCOUNTER — Telehealth (INDEPENDENT_AMBULATORY_CARE_PROVIDER_SITE_OTHER): Payer: Self-pay | Admitting: General Surgery

## 2011-07-03 ENCOUNTER — Other Ambulatory Visit (INDEPENDENT_AMBULATORY_CARE_PROVIDER_SITE_OTHER): Payer: Self-pay

## 2011-07-03 DIAGNOSIS — C50919 Malignant neoplasm of unspecified site of unspecified female breast: Secondary | ICD-10-CM

## 2011-07-03 NOTE — Telephone Encounter (Signed)
Verbal report from lumpectomy reveals radial scar, ALH, and 3 mm focus of IDC. Discussed with Dr. Lyndon Code on phone.   I called patient and discussed path with her.  Plan breast MRI and office appt. with me next week.  [name] 07/03/2011 12:14 PM

## 2011-07-10 ENCOUNTER — Telehealth (INDEPENDENT_AMBULATORY_CARE_PROVIDER_SITE_OTHER): Payer: Self-pay | Admitting: General Surgery

## 2011-07-10 ENCOUNTER — Ambulatory Visit
Admission: RE | Admit: 2011-07-10 | Discharge: 2011-07-10 | Disposition: A | Discharge status: Home / Self Care | Payer: Medicare Other | Source: Ambulatory Visit | Attending: General Surgery | Admitting: General Surgery

## 2011-07-10 DIAGNOSIS — C50919 Malignant neoplasm of unspecified site of unspecified female breast: Secondary | ICD-10-CM

## 2011-07-10 MED ORDER — GADOBENATE DIMEGLUMINE 529 MG/ML IV SOLN
20.0000 mL | Freq: Once | INTRAVENOUS | Status: AC | PRN
  Administered 2011-07-10: 20 mL via INTRAVENOUS

## 2011-07-10 NOTE — Telephone Encounter (Signed)
PT CAME TO OFFICE AFTER HAVING MAMMOGRAM AT [facility] [place] TODAY REQUESTING STRONGER PAIN MEDICATION FOR BREAST PAIN AFTER SURGERY. I REVIEWED THIS WITH DR. Dalbert Batman AND HE WROTE A RX FOR PERCOCET 5-325 #30. HE ALSO ASKED ME TO CHECK PT'S BREAST BX SITE. I EXAMINED [facility] [place]'S BREAST IN AN EXAM ROOM AND INCISION WAS CLOSED WITH GLUE. NO REDNESS  OR DRAINAGE NOTED. SOFT TO TOUCH. PT IS SCHEDULED TO SEE DR. INGRAM ON Monday 07-13-11. PT WAS GIVEN THE RX FOR PAIN ALSO. GY

## 2011-07-13 ENCOUNTER — Ambulatory Visit (INDEPENDENT_AMBULATORY_CARE_PROVIDER_SITE_OTHER): Payer: Medicare Other | Admitting: General Surgery

## 2011-07-13 ENCOUNTER — Encounter (INDEPENDENT_AMBULATORY_CARE_PROVIDER_SITE_OTHER): Payer: Self-pay | Admitting: General Surgery

## 2011-07-13 VITALS — BP 144/102 | HR 60 | Temp 97.6°F | Resp 24 | Ht 65.5 in | Wt 209.8 lb

## 2011-07-13 DIAGNOSIS — C50919 Malignant neoplasm of unspecified site of unspecified female breast: Secondary | ICD-10-CM

## 2011-07-13 DIAGNOSIS — C50912 Malignant neoplasm of unspecified site of left female breast: Secondary | ICD-10-CM

## 2011-07-13 NOTE — Patient Instructions (Signed)
Your left breast lumpectomy specimen reveals a small, 3 mm invasive breast cancer. This has been completely removed and you do not need to have any further surgery on your left breast. The incision is healing normally.We have talked about different options for treatment, and you have stated that you think your preference is  breast conservation. We have talked about a sentinel lymph node biopsy which I think is the next step. You have been offered referral to a medical oncologist for a second opinion. You have been given patient information books about breast cancer.  It was your preference to go home and think about this for a few days and to call  back and let me know you're preference. I will make an appointment for you to come back and see me for further discussion in 2 weeks.

## 2011-07-13 NOTE — Progress Notes (Signed)
Subjective:     Patient ID: [name], female   DOB: [birth year], 55 y.o.   MRN: [id number]  HPI This patient underwent left partial mastectomy with bracketed needle localization on June 30, 2011. Preop biopsy was indeterminate. The final pathology report showed a 3 mm invasive ductal carcinoma, grade i/iii and assoc.  lobular neoplasia. Pathologic stage T1a,  Nx,. There is a good, 3 mm margin. Receptors are strongly positive, HER-2-negative, Ki-67 9%.  She is having no problems with wound healing.  We spent a long time discussing options for treatment in the office today. We talked about lumpectomy and mastectomy. We talked about sentinel node biopsy,  adjuvant radiation therapy and the possibility of other adjuvant chemotherapy. Right now she thinks she is interested in  breast conservation but want to go home and think about it for a few days before making a decision.  She was given a copy of her pathology report  Review of Systems     Objective:   Physical Exam Left breast incision is healing normally. No hematoma, no seroma, no infection, tissue soft. Minimally tender. Nipple looks normal.    Assessment:     Invasive ductal carcinoma left breast, 3 mm tumor, grade 1 of 3, pathologic stage T1a, Nx with good margins. Receptor positive, Ki-67 9%, HER-2-negative    Plan:     We had a very, very long discussion today about treatment of breast cancer. We went over all surgical options. We discussed adjuvant radiation therapy, adjuvant chemotherapy, and antiestrogen therapy. We talked about second opinion with a medical oncologist.  I told her that I felt the next step in her care was a SLN  biopsy, to be followed by referral to a medical oncologist and radiation oncologist. This would be the planned unless she changed her mind and wanted a mastectomy.  She is going to go home and think about this and let me know her what her preference is. She will return to  see me in 2 weeks  regardless.

## 2011-07-16 ENCOUNTER — Telehealth (INDEPENDENT_AMBULATORY_CARE_PROVIDER_SITE_OTHER): Payer: Self-pay

## 2011-07-16 ENCOUNTER — Other Ambulatory Visit (INDEPENDENT_AMBULATORY_CARE_PROVIDER_SITE_OTHER): Payer: Self-pay | Admitting: General Surgery

## 2011-07-16 DIAGNOSIS — C50912 Malignant neoplasm of unspecified site of left female breast: Secondary | ICD-10-CM

## 2011-07-16 NOTE — Telephone Encounter (Signed)
Pt called and wants to proceed with scheduling her left axillary slnbx as discussed at 12-17 office visit. Per Fraser Din in scheduling Dr Dalbert Batman needs to put orders in epic for surgery. I have advised pt that I will send msg to Dr Dalbert Batman to place orders so we can pick surgery date.

## 2011-07-17 ENCOUNTER — Telehealth: Payer: Self-pay | Admitting: *Deleted

## 2011-07-17 NOTE — Telephone Encounter (Signed)
Confirmed 07/20/11 appt w/ pt.  Mailed before letter & packet to pt.

## 2011-07-20 ENCOUNTER — Telehealth: Payer: Self-pay | Admitting: *Deleted

## 2011-07-20 ENCOUNTER — Other Ambulatory Visit (HOSPITAL_BASED_OUTPATIENT_CLINIC_OR_DEPARTMENT_OTHER): Payer: Medicare Other | Admitting: Lab

## 2011-07-20 ENCOUNTER — Ambulatory Visit (HOSPITAL_BASED_OUTPATIENT_CLINIC_OR_DEPARTMENT_OTHER): Payer: Medicare Other | Admitting: Oncology

## 2011-07-20 ENCOUNTER — Other Ambulatory Visit: Payer: Self-pay | Admitting: *Deleted

## 2011-07-20 ENCOUNTER — Ambulatory Visit: Payer: Medicare Other

## 2011-07-20 VITALS — BP 120/75 | HR 61 | Temp 97.5°F | Ht 65.5 in | Wt 209.5 lb

## 2011-07-20 DIAGNOSIS — E559 Vitamin D deficiency, unspecified: Secondary | ICD-10-CM

## 2011-07-20 DIAGNOSIS — F17200 Nicotine dependence, unspecified, uncomplicated: Secondary | ICD-10-CM

## 2011-07-20 DIAGNOSIS — C50919 Malignant neoplasm of unspecified site of unspecified female breast: Secondary | ICD-10-CM

## 2011-07-20 DIAGNOSIS — Z853 Personal history of malignant neoplasm of breast: Secondary | ICD-10-CM

## 2011-07-20 DIAGNOSIS — D751 Secondary polycythemia: Secondary | ICD-10-CM

## 2011-07-20 LAB — CBC WITH DIFFERENTIAL/PLATELET
BASO%: 0.9 % (ref 0.0–2.0)
EOS%: 2.7 % (ref 0.0–7.0)
MCH: 32.2 pg (ref 25.1–34.0)
MCHC: 34.6 g/dL (ref 31.5–36.0)
MONO#: 0.5 10*3/uL (ref 0.1–0.9)
RBC: 5.1 10*6/uL (ref 3.70–5.45)
WBC: 12 10*3/uL — ABNORMAL HIGH (ref 3.9–10.3)
lymph#: 1.6 10*3/uL (ref 0.9–3.3)

## 2011-07-20 LAB — COMPREHENSIVE METABOLIC PANEL
ALT: 14 U/L (ref 0–35)
AST: 16 U/L (ref 0–37)
CO2: 24 mEq/L (ref 19–32)
Calcium: 9.4 mg/dL (ref 8.4–10.5)
Chloride: 107 mEq/L (ref 96–112)
Creatinine, Ser: 0.95 mg/dL (ref 0.50–1.10)
Sodium: 141 mEq/L (ref 135–145)
Total Bilirubin: 0.3 mg/dL (ref 0.3–1.2)
Total Protein: 6.7 g/dL (ref 6.0–8.3)

## 2011-07-20 LAB — CANCER ANTIGEN 27.29: CA 27.29: 49 U/mL — ABNORMAL HIGH (ref 0–39)

## 2011-07-20 NOTE — Telephone Encounter (Signed)
gave patient appointment for 08-11-2011 starting at 11:30am printed out calendar and gave to the patient

## 2011-07-20 NOTE — Progress Notes (Signed)
Referral MD Olivia Mackie; Dr Ezzie Dural; Dr Leane Para;  Reason for Referral:  Breast cancer  Chief Complaint  Patient presents with  . Breast Cancer  55 yo woman from [facility], who presents with breast mass on screeing mammogram provided by [facility] program. Last mammogram was ~5 y ago. Screening mammogram showed an abnormality in the lt breast. Biopsy 03/31/11- benign breast parenchyma. Excisional biopsy on12/4/12 showed an invasive ductal cancer grade1/3, measuring 0.32 cm with negative surgical margins. Invasive component was ER/PR+, at 100% respectively, with a proliferative index of 9%.[name] MRI of the breasts did not reveal any specific evidence for malignancy.   HPI:   Past Medical History  Diagnosis Date  . Depression with anxiety   . Hypertension   . Hyperlipidemia   . COPD (chronic obstructive pulmonary disease)   . Carotid stenosis     dopplers 5/08: 0-30% bilateral  . Breast mass   . Duodenitis   . Hemorrhoids   . Hx of adenomatous colonic polyps   . Myocardial infarction   . Angina   . Chronic kidney disease     OCC INCONT  . GERD (gastroesophageal reflux disease)   . Headache   . Anxiety   . Depression   . CAD (coronary artery disease)     NSTEMI 4/08: pLAD 30%, pCFX 40%, OM3 occluded (PCI unsuccessful), mRCA 50%, dRCA 95% treated with BMS, inf HK, EF 50%  :  Past Surgical History  Procedure Date  . Coronary stent placement   . Cesarean section 1987  . Elbow surgery   . Coronary angioplasty     DR COOPER PLACED STENT 4 YRS  . Breast biopsy 06/30/2011    Procedure: BREAST BIOPSY WITH NEEDLE LOCALIZATION;  Surgeon: [name], MD;  Location: [place];  Service: General;  Laterality: Left;  needle localization at breast center , left breast lumpectomy    . Breast lumpectomy 06/30/11    left breast  :  Current outpatient prescriptions:albuterol (PROVENTIL) (5 MG/ML) 0.5% nebulizer solution, Take 2.5 mg by nebulization every 6 (six) hours as needed.  For shortness of breath, Disp: , Rfl: ;  ALPRAZolam (XANAX) 0.5 MG tablet, Take 0.5 mg by mouth daily as needed. For anxiety., Disp: , Rfl: ;  aspirin 325 MG tablet, Take 325 mg by mouth daily.  , Disp: , Rfl: ;  B Complex-C (SUPER B COMPLEX PO), Take 1 tablet by mouth daily.  , Disp: , Rfl:  clonazePAM (KLONOPIN) 0.5 MG tablet, Take 1 mg by mouth at bedtime as needed. For sleep, Disp: , Rfl: ;  fish oil-omega-3 fatty acids 1000 MG capsule, Take 2 g by mouth 2 (two) times daily. , Disp: , Rfl: ;  lisinopril (PRINIVIL,ZESTRIL) 5 MG tablet, Take 5 mg by mouth daily.  , Disp: , Rfl: ;  metoprolol tartrate (LOPRESSOR) 25 MG tablet, Take 1 tablet (25 mg total) by mouth 2 (two) times daily., Disp: 60 tablet, Rfl: 11 oxyCODONE-acetaminophen (PERCOCET) 5-325 MG per tablet, Take 1 tablet by mouth every 4 (four) hours as needed.  , Disp: , Rfl: ;  PARoxetine (PAXIL) 20 MG tablet, Take 60 mg by mouth daily. 3 tabs daily, Disp: , Rfl: ;  pravastatin (PRAVACHOL) 40 MG tablet, Take 40 mg by mouth daily.  , Disp: , Rfl: :    :  Allergies  Allergen Reactions  . Food Other (See Comments)    Nutmeg= difficulty breathing  :  Family History  Problem Relation Age of Onset  .  Heart disease Mother   . Heart disease Father   . Cancer Maternal Aunt     pt unaware of what kind  . Cancer Maternal Grandmother     ovarian  :  History   Social History  . Marital Status: Single    Spouse Name: N/A    Number of Children: 1 -age 78, expecting a child.  . Years of Education: She is on diability x 1 yr, secondary to psychiatric illness. N/A   Occupational History  . Not on file.   Social History Main Topics  . Smoking status: Current Everyday Smoker -- 1.0 packs/day x38 y  . Smokeless tobacco: Not on file  . Alcohol Use: Yes     SOCIALLY  . Drug Use: No  . Sexually Active:    Other Topics Concern  . Not on file   Social History Narrative  . No narrative on file  : Reproductive History Menarche age 68,  menopause @50  , no HRT G1P0 Respiratory: positive for dyspnea on exertion  Exam: @IPVITALS @ General appearance: alert, cooperative and appears stated age Throat: lips, mucosa, and tongue normal; teeth and gums normal Resp: wheezes bibasilar Breasts: normal appearance, no masses or tenderness, lt breast s/p lumpectomy. Cardio: regular rate and rhythm, S1, S2 normal, no murmur, click, rub or gallop GI: soft, non-tender; bowel sounds normal; no masses,  no organomegaly Extremities: extremities normal, atraumatic, no cyanosis or edema Lymph nodes: Cervical, supraclavicular, and axillary nodes normal. Neurologic: Grossly normal   Basename 07/20/11 1155  WBC 12.0*  HGB 16.4*  HCT 47.5*  PLT 184    Basename 07/20/11 1155  NA 141  K 4.2  CL 107  CO2 24  GLUCOSE 142*  BUN 16  CREATININE 0.95  CALCIUM 9.4    Blood smear review: n/a  Pathology:as discussed  Mr Breast Bilateral W Wo Contrast  07/10/2011  *RADIOLOGY REPORT*  Clinical Data: New diagnosis breast cancer after excisional biopsy showing lobular neoplasia and invasive ductal carcinoma measuring 0.3 cm in the left breast.  BILATERAL BREAST MRI WITH AND WITHOUT CONTRAST  Technique: Multiplanar, multisequence MR images of both breasts were obtained prior to and following the intravenous administration of 47ml of Multihance.  Three dimensional images were evaluated at the independent DynaCad workstation.  Comparison:  Mammogram dated 03/31/2011.  Findings: Postsurgical fluid collections are seen in the medial central and retroareolar region of the left breast at the junction of the anterior and middle third measuring 4.6 x 4.6 cm and 1.6 x 2.2 cm.  Numerous foci of  nonspecific enhancement are noted bilaterally.  No dominant mass or suspicious enhancement is seen in either breast.  There is no axillary or internal mammary adenopathy.  IMPRESSION: Postsurgical changes, left breast without evidence of residual malignancy.  No MRI  specific evidence of malignancy, right breast. No adenopathy.  THREE-DIMENSIONAL MR IMAGE RENDERING ON INDEPENDENT WORKSTATION:  Three-dimensional MR images were rendered by post-processing of the original MR data on an independent workstation.  The three- dimensional MR images were interpreted, and findings were reported in the accompanying complete MRI report for this study.  BI-RADS CATEGORY 6:  Known biopsy-proven malignancy - appropriate action should be taken.  Original Report Authenticated By: Karis Juba, M.D.   Mm Breast Surgical Specimen  06/30/2011  *RADIOLOGY REPORT*  Clinical Data:  Left breast area of distortion.  NEEDLE LOCALIZATION WITH MAMMOGRAPHIC GUIDANCE AND SPECIMEN RADIOGRAPH  I met with the patient and we discussed the procedure of needle localization including  benefits and alternatives. We discussed the high likelihood of a successful procedure. We discussed the risks of the procedure, including infection, bleeding, tissue injury, and further surgery. Informed, written consent was given.  Using mammographic guidance, sterile technique, 2% lidocaine and a two 7 cm modified Kopans needles the area of distortion and clip located anterior the area of distortion were localized using a medial bracketed approach.The films are marked for Dr.Ingram.  Specimen radiograph was performed at day surgery, and confirms the area of distortion, clip, and two intact wires to bepresent in the tissue sample.  The specimen is marked for pathology.  IMPRESSION: Needle localization of the left breast.  No apparent complications.  Original Report Authenticated By: Altamese Cabal, M.D.   Mm Breast Wire Localization Left  06/30/2011  *RADIOLOGY REPORT*  Clinical Data:  Left breast area of distortion.  NEEDLE LOCALIZATION WITH MAMMOGRAPHIC GUIDANCE AND SPECIMEN RADIOGRAPH  I met with the patient and we discussed the procedure of needle localization including benefits and alternatives. We discussed the  high likelihood of a successful procedure. We discussed the risks of the procedure, including infection, bleeding, tissue injury, and further surgery. Informed, written consent was given.  Using mammographic guidance, sterile technique, 2% lidocaine and a two 7 cm modified Kopans needles the area of distortion and clip located anterior the area of distortion were localized using a medial bracketed approach.The films are marked for Dr.Ingram.  Specimen radiograph was performed at day surgery, and confirms the area of distortion, clip, and two intact wires to bepresent in the tissue sample.  The specimen is marked for pathology.  IMPRESSION: Needle localization of the left breast.  No apparent complications.  Original Report Authenticated By: Altamese Cabal, M.D.   Mm Digital Diagnostic Bilat  07/10/2011  *RADIOLOGY REPORT*  Clinical Data:  Left lumpectomy and 06/30/2011 revealing a 0.3 cm focus of invasive ductal carcinoma.  DIGITAL DIAGNOSTIC BILATERAL MAMMOGRAM WITH CAD  Comparison:  Multiple priors  Findings:  Post lumpectomy changes are seen in the left breast.  No mass or suspicious calcifications are seen.  Compared to priors. Mammographic images were processed with CAD.  IMPRESSION: Postsurgical changes, left breast.  No mammographic evidence of malignancy.  Recommend screening mammography in 1 year.  BI-RADS CATEGORY 2:  Benign finding(s).  Original Report Authenticated By: Karis Juba, M.D.    Assessment and Plan: 55 yo [place] woman with history of T1a, er/pr+ breast cancer, s/p lumpectomy. I spent 45 minutes discussing this as well as her polycythemia. Looking back her hct is the 45 to 50 range over the past few years. I explained that is in all likelihood related to her smoking, which she must curtail. I plan on checking other parameters to r/o other etiologies of elevated HCT, as well as a vitamin d level and a bone density test. In all likelihood she will receive hormonal therapy  alone after she has undergone radiation therapy. Dr In gram is planning on going back to do a sentinel node biopsy. I will see her again in 3 weeks.

## 2011-07-22 ENCOUNTER — Ambulatory Visit (INDEPENDENT_AMBULATORY_CARE_PROVIDER_SITE_OTHER): Payer: Medicare Other | Admitting: Cardiovascular Disease

## 2011-07-22 ENCOUNTER — Encounter: Payer: Self-pay | Admitting: Cardiovascular Disease

## 2011-07-22 VITALS — BP 120/78 | HR 60 | Ht 65.0 in | Wt 211.4 lb

## 2011-07-22 DIAGNOSIS — I2510 Atherosclerotic heart disease of native coronary artery without angina pectoris: Secondary | ICD-10-CM

## 2011-07-22 NOTE — Assessment & Plan Note (Signed)
The patient is stable. She has mild anginal symptoms with exertion. Her coronary anatomy shows chronic occlusion of the left circumflex and right coronary arteries with collaterals to both territories. We discussed the possibility of either ongoing medical treatment versus percutaneous intervention. Since her angina is mild, I would favor medical treatment at the present time. She has refractory symptoms she certainly could be considered for PCI the right coronary artery. I'll plan on seeing her back in 6 months unless her symptoms progress and then she will notify our office. She is on appropriate medical therapy with aspirin, ACE inhibitor, beta blocker, and statin drug. She needs to quit smoking and this was discussed at length with the patient.

## 2011-07-22 NOTE — Patient Instructions (Signed)
Your physician wants you to follow-up in: 6 MONTHS.  You will receive a reminder letter in the mail two months in advance. If you don't receive a letter, please call our office to schedule the follow-up appointment.  Your physician recommends that you continue on your current medications as directed. Please refer to the Current Medication list given to you today.  

## 2011-07-22 NOTE — Progress Notes (Signed)
HPI:  55 -year-old woman presented for followup evaluation. The patient has coronary artery disease and exertional angina.  She underwent coronary stenting in 2000 a with a bare-metal stent in the right coronary artery. The patient had a preoperative stress test earlier this year for a lumpectomy. This demonstrated inferolateral ischemia and she was referred for cardiac catheterization. Showed total occlusion of her right coronary artery with left to right collaterals. There was also notation of chronic occlusion of the left circumflex which was present on her previous study in 2008. She had mild-to-moderate disease in the LAD.  The patient has undergone lumpectomy and this demonstrated invasive ductal carcinoma with clear surgical margins. She is going to undergo sentinel lymph node biopsy next week. She will then require radiation treatment.  From a cardiac standpoint, the patient is doing fairly well. She denies palpitations, edema, lightheadedness, or syncope. She has chronic dyspnea with exertion and this is her main limitation. She has mild chest tightness with exertion. This is unchanged over time. Her symptoms resolve with rest.  Outpatient Encounter Prescriptions as of 07/22/2011  Medication Sig Dispense Refill  . albuterol (PROVENTIL) (5 MG/ML) 0.5% nebulizer solution Take 2.5 mg by nebulization every 6 (six) hours as needed. For shortness of breath      . ALPRAZolam (XANAX) 0.5 MG tablet Take 0.5 mg by mouth daily as needed. For anxiety.      [name] aspirin 325 MG tablet Take 325 mg by mouth daily.        . B Complex-C (SUPER B COMPLEX PO) Take 1 tablet by mouth daily.        . clonazePAM (KLONOPIN) 0.5 MG tablet Take 1 mg by mouth at bedtime as needed. For sleep      . fish oil-omega-3 fatty acids 1000 MG capsule Take 2 g by mouth 2 (two) times daily.       [name] lisinopril (PRINIVIL,ZESTRIL) 5 MG tablet Take 5 mg by mouth daily.        . metoprolol tartrate (LOPRESSOR) 25 MG tablet Take 1 tablet  (25 mg total) by mouth 2 (two) times daily.  60 tablet  11  . oxyCODONE-acetaminophen (PERCOCET) 5-325 MG per tablet Take 1 tablet by mouth every 4 (four) hours as needed.        [name] PARoxetine (PAXIL) 20 MG tablet Take 60 mg by mouth daily. 3 tabs daily      . pravastatin (PRAVACHOL) 40 MG tablet Take 40 mg by mouth daily.          Allergies  Allergen Reactions  . Food Other (See Comments)    Nutmeg= difficulty breathing    Past Medical History  Diagnosis Date  . Depression with anxiety   . Hypertension   . Hyperlipidemia   . COPD (chronic obstructive pulmonary disease)   . Carotid stenosis     dopplers 5/08: 0-30% bilateral  . Breast mass   . Duodenitis   . Hemorrhoids   . Hx of adenomatous colonic polyps   . Myocardial infarction   . Angina   . Chronic kidney disease     OCC INCONT  . GERD (gastroesophageal reflux disease)   . Headache   . Anxiety   . Depression   . CAD (coronary artery disease)     NSTEMI 4/08: pLAD 30%, pCFX 40%, OM3 occluded (PCI unsuccessful), mRCA 50%, dRCA 95% treated with BMS, inf HK, EF 50%    ROS: Negative except as per HPI  BP 120/78  Pulse 60  Ht 5\' 5"  (1.651 m)  Wt 95.89 kg (211 lb 6.4 oz)  BMI 35.18 kg/m2  PHYSICAL EXAM: Pt is alert and oriented, overweight woman in NAD HEENT: normal Neck: JVP - normal, carotids 2+= without bruits Lungs: CTA bilaterally CV: RRR without murmur or gallop Abd: soft, NT, Positive BS, no hepatomegaly Ext: no C/C/E, distal pulses intact and equal Skin: warm/dry no rash  ASSESSMENT AND PLAN:

## 2011-07-24 ENCOUNTER — Encounter: Payer: Self-pay | Admitting: *Deleted

## 2011-07-24 ENCOUNTER — Encounter (HOSPITAL_BASED_OUTPATIENT_CLINIC_OR_DEPARTMENT_OTHER): Payer: Self-pay | Admitting: *Deleted

## 2011-07-24 DIAGNOSIS — I219 Acute myocardial infarction, unspecified: Secondary | ICD-10-CM | POA: Insufficient documentation

## 2011-07-24 DIAGNOSIS — I252 Old myocardial infarction: Secondary | ICD-10-CM | POA: Insufficient documentation

## 2011-07-24 NOTE — Pre-Procedure Instructions (Signed)
To come for BMET  Stop ASA and fish oil 5 days prior to surg.  Take shower AM DOS prior to arrival at [facility]

## 2011-07-24 NOTE — Pre-Procedure Instructions (Signed)
Cardiac hx. reviewed with Dr. Albertina Parr - pt. OK for surg. At [facility]

## 2011-07-29 ENCOUNTER — Ambulatory Visit
Admission: RE | Admit: 2011-07-29 | Discharge: 2011-07-29 | Disposition: A | Discharge status: Home / Self Care | Payer: Medicare HMO | Source: Ambulatory Visit | Attending: Radiation Oncology | Admitting: Radiation Oncology

## 2011-07-29 ENCOUNTER — Encounter: Payer: Self-pay | Admitting: Radiation Oncology

## 2011-07-29 VITALS — BP 137/75 | HR 66 | Temp 97.0°F | Resp 20 | Ht 65.0 in | Wt 210.0 lb

## 2011-07-29 DIAGNOSIS — C50219 Malignant neoplasm of upper-inner quadrant of unspecified female breast: Secondary | ICD-10-CM

## 2011-07-29 DIAGNOSIS — C50919 Malignant neoplasm of unspecified site of unspecified female breast: Secondary | ICD-10-CM | POA: Insufficient documentation

## 2011-07-29 DIAGNOSIS — J4489 Other specified chronic obstructive pulmonary disease: Secondary | ICD-10-CM | POA: Insufficient documentation

## 2011-07-29 DIAGNOSIS — L988 Other specified disorders of the skin and subcutaneous tissue: Secondary | ICD-10-CM | POA: Insufficient documentation

## 2011-07-29 DIAGNOSIS — Y842 Radiological procedure and radiotherapy as the cause of abnormal reaction of the patient, or of later complication, without mention of misadventure at the time of the procedure: Secondary | ICD-10-CM | POA: Insufficient documentation

## 2011-07-29 DIAGNOSIS — N189 Chronic kidney disease, unspecified: Secondary | ICD-10-CM | POA: Insufficient documentation

## 2011-07-29 DIAGNOSIS — Z8049 Family history of malignant neoplasm of other genital organs: Secondary | ICD-10-CM | POA: Insufficient documentation

## 2011-07-29 DIAGNOSIS — Z170 Estrogen receptor positive status [ER+]: Secondary | ICD-10-CM | POA: Insufficient documentation

## 2011-07-29 DIAGNOSIS — E785 Hyperlipidemia, unspecified: Secondary | ICD-10-CM | POA: Insufficient documentation

## 2011-07-29 DIAGNOSIS — J449 Chronic obstructive pulmonary disease, unspecified: Secondary | ICD-10-CM | POA: Insufficient documentation

## 2011-07-29 DIAGNOSIS — Z79899 Other long term (current) drug therapy: Secondary | ICD-10-CM | POA: Insufficient documentation

## 2011-07-29 DIAGNOSIS — I129 Hypertensive chronic kidney disease with stage 1 through stage 4 chronic kidney disease, or unspecified chronic kidney disease: Secondary | ICD-10-CM | POA: Insufficient documentation

## 2011-07-29 DIAGNOSIS — Z510 Encounter for antineoplastic radiation therapy: Secondary | ICD-10-CM | POA: Insufficient documentation

## 2011-07-29 DIAGNOSIS — I252 Old myocardial infarction: Secondary | ICD-10-CM | POA: Insufficient documentation

## 2011-07-29 DIAGNOSIS — K219 Gastro-esophageal reflux disease without esophagitis: Secondary | ICD-10-CM | POA: Insufficient documentation

## 2011-07-29 DIAGNOSIS — Z809 Family history of malignant neoplasm, unspecified: Secondary | ICD-10-CM | POA: Insufficient documentation

## 2011-07-29 NOTE — Progress Notes (Signed)
Pt scheduled for sentinel node bx 07/31/11.  Pt states she had one episode stabbing pain in L mid-back 3 days ago, lasted for several hours. Took pain med w/good relief.

## 2011-07-29 NOTE — Progress Notes (Signed)
Please see the Nurse Progress Note in the MD Initial Consult Encounter for this patient. 

## 2011-07-30 ENCOUNTER — Encounter: Payer: Self-pay | Admitting: Radiation Oncology

## 2011-07-30 ENCOUNTER — Ambulatory Visit (INDEPENDENT_AMBULATORY_CARE_PROVIDER_SITE_OTHER): Payer: Medicare Other | Admitting: General Surgery

## 2011-07-30 ENCOUNTER — Encounter: Payer: Self-pay | Admitting: *Deleted

## 2011-07-30 DIAGNOSIS — C50219 Malignant neoplasm of upper-inner quadrant of unspecified female breast: Secondary | ICD-10-CM | POA: Diagnosis present

## 2011-07-30 NOTE — H&P (Signed)
[name]    MRN: [id number]   Description: 56 year old female  Provider: [name], MD  Department: [facility]        Diagnoses     Breast cancer, left breast   - Primary    174.9      Reason for Visit     Routine Post Op    lt breast lumpectomy 06/30/11    New diagnosis breast cancer          Vitals - Last Recorded       BP Pulse Temp(Src) Resp Ht Wt    144/102  60  97.6 F (36.4 C) (Temporal)  24  5' 5.5" (1.664 m)  209 lb 12.8 oz (95.165 kg)          BMI     34.38 kg/m2                 H & P     [name], MD SignedSubjective:      Patient ID: [name], female   DOB: 1956/04/29, 56 y.o.   MRN: [id number]   HPI This patient underwent left partial mastectomy with bracketed needle localization on June 30, 2011. Preop biopsy was indeterminate. The final pathology report showed a 3 mm invasive ductal carcinoma, grade i/iii and assoc.  lobular neoplasia. Pathologic stage T1a,  Nx,. There is a good, 3 mm margin. Receptors are strongly positive, HER-2-negative, Ki-67 9%.   She is having no problems with wound healing.   We spent a long time discussing options for treatment in the office today. We talked about lumpectomy and mastectomy. We talked about sentinel node biopsy,  adjuvant radiation therapy and the possibility of other adjuvant chemotherapy. Right now she thinks she is interested in  breast conservation but want to go home and think about it for a few days before making a decision.   She was given a copy of her pathology report  Past Medical History  Diagnosis Date  . Depression with anxiety   . Hyperlipidemia   . Angina   . Chronic kidney disease     OCC INCONT  . Anxiety   . Depression   . CAD (coronary artery disease)     NSTEMI 4/08: pLAD 30%, pCFX 40%, OM3 occluded (PCI unsuccessful), mRCA 50%, dRCA 95% treated with BMS, inf HK, EF 50%  . COPD (chronic obstructive pulmonary disease)   . Headache    sinus; occ. migraines  . GERD (gastroesophageal reflux disease)     OTC acid reducer prn  . Hypertension     under control; has been on med. x 4 yrs.  . Stress incontinence, female   . Breast cancer 06/30/11    inv ductal, ER/PR +, her-2 -  . Carotid stenosis     dopplers 5/08: 0-30% bilateral  . Myocardial infarction 2008    1 stent placed    No current facility-administered medications for this encounter.   Current Outpatient Prescriptions  Medication Sig Dispense Refill  . ALPRAZolam (XANAX) 0.5 MG tablet Take 0.5 mg by mouth daily as needed. For anxiety.      [name] aspirin 325 MG tablet Take 325 mg by mouth daily.        . B Complex-C (SUPER B COMPLEX PO) Take 1 tablet by mouth daily.       . clonazePAM (KLONOPIN) 0.5 MG tablet Take 1 mg by mouth at bedtime as needed. For sleep      .  fish oil-omega-3 fatty acids 1000 MG capsule Take 2 g by mouth 2 (two) times daily.       [name] lisinopril (PRINIVIL,ZESTRIL) 5 MG tablet Take 5 mg by mouth daily. PM      . metoprolol tartrate (LOPRESSOR) 25 MG tablet Take 1 tablet (25 mg total) by mouth 2 (two) times daily.  60 tablet  11  . oxyCODONE-acetaminophen (PERCOCET) 5-325 MG per tablet Take 1 tablet by mouth every 4 (four) hours as needed.        [name] PARoxetine (PAXIL) 20 MG tablet Take 60 mg by mouth daily. 3 tabs daily - PM      . pravastatin (PRAVACHOL) 40 MG tablet Take 40 mg by mouth daily. PM      . albuterol (PROVENTIL) (5 MG/ML) 0.5% nebulizer solution Take 2.5 mg by nebulization every 6 (six) hours as needed. For shortness of breath        Allergies  Allergen Reactions  . Food Other (See Comments)    Nutmeg= difficulty breathing    Family History  Problem Relation Age of Onset  . Heart disease Mother   . Heart disease Father   . Cancer Maternal Aunt     pt unaware of what kind  . Cancer Maternal Grandmother     ovarian      History   Social History  . Marital Status: Single    Spouse Name: N/A    Number of Children: N/A   . Years of Education: N/A   Occupational History  . Not on file.   Social History Main Topics  . Smoking status: Current Everyday Smoker -- 1.0 packs/day for 35 years    Types: Cigarettes  . Smokeless tobacco: Never Used  . Alcohol Use: Yes     occasionally  . Drug Use: No  . Sexually Active: Yes    Birth Control/ Protection: Post-menopausal   Other Topics Concern  . Not on file   Social History Narrative  . No narrative on file     Review of Systems - negative except as described above    Physical Exam Breasts:  Left breast incision is healing normally. No hematoma, no seroma, no infection, tissue soft. Minimally tender. Nipple looks normal. Neck: no mass. No JVD. Lungs: clear to auscultation Heart:  RRR no murmer. No ectopy.     Assessment:     Invasive ductal carcinoma left breast, 3 mm tumor, grade 1 of 3, pathologic stage T1a, Nx with good margins. Receptor positive, Ki-67 9%, HER-2-negative   Plan:    We had a very, very long discussion today about treatment of breast cancer. We went over all surgical options. We discussed adjuvant radiation therapy, adjuvant chemotherapy, and antiestrogen therapy. We talked about second opinion with a medical oncologist.   I told her that I felt the next step in her care was a SLN  biopsy, to be followed by referral to a medical oncologist and radiation oncologist. This would be the planned unless she changed her mind and wanted a mastectomy.   She is going to go home and think about this and let me know her what her preference is. She will return to  see me in 2 weeks regardless.                         Patient Instructions     Your left breast lumpectomy specimen reveals a small, 3 mm invasive breast cancer. This has been  completely removed and you do not need to have any further surgery on your left breast. The incision is healing normally.We have talked about different options for treatment, and you have  stated that you think your preference is  breast conservation. We have talked about a sentinel lymph node biopsy which I think is the next step. You have been offered referral to a medical oncologist for a second opinion. You have been given patient information books about breast cancer.

## 2011-07-30 NOTE — Progress Notes (Signed)
CC:   [name]. [name], M.D. [name], M.D., F.R.C.P.C.  REFERRING PHYSICIAN:  Edsel Reid. [name], M.D.  DIAGNOSIS:  Clinical T1a N0 M0 invasive ductal carcinoma of the left breast, ER positive, PR positive and HER-2/neu negative.  HISTORY OF PRESENT ILLNESS:  The patient is a pleasant 56 year old female from [place].  She was noted to have a possible mass within the left breast on screening mammogram.  A biopsy was performed on 03/31/2011 and this did not show any malignancy.  This was felt to still remain suspicious, and she therefore went for an excisional biopsy on 06/30/2011.  The pathology from this did reveal an invasive ductal carcinoma spanning 0.32 cm.  This was a grade 1 tumor.  The surgical resection margins were negative, with the closest margin being 0.3 cm. No lymphovascular space invasion was identified, and no DCIS was present.  This therefore represented a pT1a pNX tumor.  Dr. Dalbert Reid is planning to take the patient back for a sentinel lymph node biopsy in 2 days on Friday.  Receptor studies have been performed on this and did reveal that the tumor is ER positive, PR positive, and HER-2/neu negative, with a proliferative index of 9%.  The patient has undergone an MRI scan of the breast bilaterally as well on 07/10/2011.  Some postsurgical changes were noted within the left breast without any additional mammographic evidence of malignancy.  I have therefore been asked to see [name] today for consideration of adjuvant radiation treatment.  She has seen Dr. Truddie Coco, who felt that she likely would benefit from hormonal treatment after an anticipated course of radiation.  This is assuming that no unexpected findings are present with the upcoming sentinel lymph node biopsy.  PAST MEDICAL HISTORY:  History of anxiety and depression, hypertension, hyperlipidemia, COPD, carotid stenosis, history of hemorrhoids, history of myocardial infarction, history of  angina, history of chronic kidney disease, GERD, status post coronary stent placement, status post C- section, status post elbow surgery, status post coronary angioplasty.  CURRENT MEDICATIONS:  Proventil, Xanax, multivitamins, Klonopin, Zestril, Lopressor, Percocet, Paxil, Pravachol.  ALLERGIES:  The patient states that she is allergic to nutmeg.  FAMILY HISTORY:  The patient's grandmother had uterine cancer, and her aunt had an unknown type of cancer.  SOCIAL HISTORY:  The patient lives in [place].  She is single.  She has 1 daughter who is currently pregnant.  The patient is a current smoker and she has averaged 1 pack per day for 35 years.  She does drink alcohol on an occasional basis.  REVIEW OF SYSTEMS:  Fully reviewed and documented in the medical chart.  PHYSICAL EXAMINATION:  Weight 210 pounds.  Blood pressure 137/75, pulse 66, temperature 97.0, respiratory rate 20.  General:  A well-developed female in no acute distress, alert and oriented x3.  HEENT: Normocephalic, atraumatic.  Pupils equal, round and reactive to light. Extraocular movements intact.  Oral cavity clear.  Neck:  Supple without any lymphadenopathy.  Cardiovascular:  Regular rate and rhythm. Respiratory:  Clear to auscultation.  GI:  Abdomen soft, nontender. Normal bowel sounds.  Extremities:  No edema present.  The patient is status post a lumpectomy which appears to be healing well.  No axillary adenopathy present on either side, and no suspicious findings within the right breast.  Neuro:  No focal deficits.  IMPRESSION AND PLAN:  [name] is a pleasant 56 year old female who is status post a lumpectomy for a T1a invasive ductal carcinoma of the left breast.  She is proceeding with a sentinel lymph node biopsy on Friday. The patient's surgery did represent an excisional biopsy of a suspicious mass with the preoperative pathology not indicative of malignancy.  The receptor studies indicated that the  tumor is ER positive, PR positive, and HER-2/neu negative.  The patient therefore appears to be a good candidate to proceed with a course of adjuvant radiotherapy after her upcoming sentinel lymph node biopsy.  Assuming that this does not show any unexpected carcinoma, I believe that she will be appropriate to proceed with this, and Dr. Truddie Coco has discussed a course of hormonal treatment after this has been completed.  I discussed with the patient a typical 6-1/2 week course of radiation treatment, including the possible side effects, risks and benefits.  All of her questions were answered and she does wish to proceed with this.  Therefore, we will schedule the patient for a simulation late next week, and we will therefore proceed with treatment planning.  We will need to make sure that the axillary incision is healing adequately prior to beginning her radiation treatment.  If she does have any evidence of further malignancy through this procedure, then we will certainly need to touch base with Dr. Truddie Coco regarding what he would like to do from a systemic treatment standpoint.    ______________________________ Jodelle Gross, M.D., Ph.D. JSM/MEDQ  D:  07/30/2011  T:  07/30/2011  Job:  [id number]

## 2011-07-30 NOTE — Progress Notes (Signed)
[facility] Distress Screening CSW was referred by distress screening protocol. Pt scored a 7 on DT. CSW unable to meet with pt in exam room, therefore, CSW called pt at home.  After attempts to reach the pt CSW left a message offering support and information on support services at [facility].  CSW encouraged pt to contact any of the support team members with questions or concerns.   Johnnye Lana, [name]

## 2011-07-31 ENCOUNTER — Ambulatory Visit (HOSPITAL_COMMUNITY)
Admission: RE | Admit: 2011-07-31 | Discharge: 2011-07-31 | Disposition: A | Discharge status: Home / Self Care | Payer: Medicare HMO | Source: Ambulatory Visit | Attending: General Surgery | Admitting: General Surgery

## 2011-07-31 ENCOUNTER — Ambulatory Visit (HOSPITAL_BASED_OUTPATIENT_CLINIC_OR_DEPARTMENT_OTHER): Payer: Medicare HMO | Admitting: Anesthesiology

## 2011-07-31 ENCOUNTER — Encounter (HOSPITAL_BASED_OUTPATIENT_CLINIC_OR_DEPARTMENT_OTHER): Payer: Self-pay | Admitting: Anesthesiology

## 2011-07-31 ENCOUNTER — Other Ambulatory Visit (INDEPENDENT_AMBULATORY_CARE_PROVIDER_SITE_OTHER): Payer: Self-pay | Admitting: General Surgery

## 2011-07-31 ENCOUNTER — Ambulatory Visit (HOSPITAL_BASED_OUTPATIENT_CLINIC_OR_DEPARTMENT_OTHER)
Admission: RE | Admit: 2011-07-31 | Discharge: 2011-07-31 | Disposition: A | Discharge status: Home / Self Care | Payer: Medicare HMO | Source: Ambulatory Visit | Attending: General Surgery | Admitting: General Surgery

## 2011-07-31 ENCOUNTER — Encounter (HOSPITAL_BASED_OUTPATIENT_CLINIC_OR_DEPARTMENT_OTHER)
Admission: RE | Disposition: A | Discharge status: Home / Self Care | Payer: Self-pay | Source: Ambulatory Visit | Attending: General Surgery

## 2011-07-31 DIAGNOSIS — C50919 Malignant neoplasm of unspecified site of unspecified female breast: Secondary | ICD-10-CM | POA: Insufficient documentation

## 2011-07-31 DIAGNOSIS — C50912 Malignant neoplasm of unspecified site of left female breast: Secondary | ICD-10-CM

## 2011-07-31 HISTORY — PX: AXILLARY LYMPH NODE DISSECTION: SHX5229

## 2011-07-31 HISTORY — DX: Stress incontinence (female) (male): N39.3

## 2011-07-31 LAB — POCT I-STAT, CHEM 8
BUN: 16 mg/dL (ref 6–23)
Calcium, Ion: 1.18 mmol/L (ref 1.12–1.32)
Creatinine, Ser: 0.8 mg/dL (ref 0.50–1.10)
Glucose, Bld: 123 mg/dL — ABNORMAL HIGH (ref 70–99)
TCO2: 27 mmol/L (ref 0–100)

## 2011-07-31 SURGERY — LYMPHADENECTOMY, AXILLARY
Anesthesia: General | Site: Axilla | Laterality: Left | Wound class: Clean

## 2011-07-31 MED ORDER — CEFAZOLIN SODIUM-DEXTROSE 2-3 GM-% IV SOLR
2.0000 g | INTRAVENOUS | Status: AC
  Administered 2011-07-31: 2 g via INTRAVENOUS

## 2011-07-31 MED ORDER — KETOROLAC TROMETHAMINE 30 MG/ML IJ SOLN
INTRAMUSCULAR | Status: DC | PRN
  Administered 2011-07-31: 30 mg via INTRAVENOUS

## 2011-07-31 MED ORDER — SODIUM CHLORIDE 0.9 % IJ SOLN: 3.0000 mL | Freq: Two times a day (BID) | INTRAMUSCULAR | Status: DC

## 2011-07-31 MED ORDER — TECHNETIUM TC 99M SULFUR COLLOID FILTERED
1.0000 | Freq: Once | INTRAVENOUS | Status: AC | PRN
  Administered 2011-07-31: 1 via INTRADERMAL

## 2011-07-31 MED ORDER — ACETAMINOPHEN 10 MG/ML IV SOLN
1000.0000 mg | Freq: Once | INTRAVENOUS | Status: AC
  Administered 2011-07-31: 1000 mg via INTRAVENOUS

## 2011-07-31 MED ORDER — METOCLOPRAMIDE HCL 5 MG/ML IJ SOLN
INTRAMUSCULAR | Status: DC | PRN
  Administered 2011-07-31: 10 mg via INTRAVENOUS

## 2011-07-31 MED ORDER — PROMETHAZINE HCL 25 MG/ML IJ SOLN: 12.5000 mg | Freq: Four times a day (QID) | INTRAMUSCULAR | Status: DC | PRN

## 2011-07-31 MED ORDER — OXYCODONE HCL 5 MG PO TABS: 5.0000 mg | ORAL_TABLET | ORAL | Status: DC | PRN

## 2011-07-31 MED ORDER — LIDOCAINE HCL (CARDIAC) 20 MG/ML IV SOLN
INTRAVENOUS | Status: DC | PRN
  Administered 2011-07-31: 80 mg via INTRAVENOUS

## 2011-07-31 MED ORDER — MIDAZOLAM HCL 2 MG/2ML IJ SOLN
0.5000 mg | INTRAMUSCULAR | Status: DC | PRN
  Administered 2011-07-31: 1 mg via INTRAVENOUS

## 2011-07-31 MED ORDER — HYDROCODONE-ACETAMINOPHEN 5-325 MG PO TABS: 1.0000 | ORAL_TABLET | ORAL | Status: AC | PRN

## 2011-07-31 MED ORDER — HYDROMORPHONE HCL PF 1 MG/ML IJ SOLN
0.2500 mg | INTRAMUSCULAR | Status: DC | PRN
  Administered 2011-07-31 (×3): 0.25 mg via INTRAVENOUS

## 2011-07-31 MED ORDER — MIDAZOLAM HCL 5 MG/5ML IJ SOLN
INTRAMUSCULAR | Status: DC | PRN
  Administered 2011-07-31: 1 mg via INTRAVENOUS

## 2011-07-31 MED ORDER — BUPIVACAINE-EPINEPHRINE 0.5% -1:200000 IJ SOLN
INTRAMUSCULAR | Status: DC | PRN
  Administered 2011-07-31: 9 mL

## 2011-07-31 MED ORDER — ONDANSETRON HCL 4 MG/2ML IJ SOLN
INTRAMUSCULAR | Status: DC | PRN
  Administered 2011-07-31: 4 mg via INTRAVENOUS

## 2011-07-31 MED ORDER — MORPHINE SULFATE 2 MG/ML IJ SOLN: 0.0500 mg/kg | INTRAMUSCULAR | Status: DC | PRN

## 2011-07-31 MED ORDER — SODIUM CHLORIDE 0.9 % IV SOLN: INTRAVENOUS | Status: DC

## 2011-07-31 MED ORDER — METOCLOPRAMIDE HCL 5 MG/ML IJ SOLN: 10.0000 mg | Freq: Once | INTRAMUSCULAR | Status: DC | PRN

## 2011-07-31 MED ORDER — FENTANYL CITRATE 0.05 MG/ML IJ SOLN
INTRAMUSCULAR | Status: DC | PRN
  Administered 2011-07-31: 50 ug via INTRAVENOUS

## 2011-07-31 MED ORDER — HYDROCODONE-ACETAMINOPHEN 5-325 MG PO TABS
1.0000 | ORAL_TABLET | Freq: Once | ORAL | Status: AC
  Administered 2011-07-31: 1 via ORAL

## 2011-07-31 MED ORDER — SODIUM CHLORIDE 0.9 % IJ SOLN
INTRAMUSCULAR | Status: DC | PRN
  Administered 2011-07-31: 15:00:00 via INTRAMUSCULAR

## 2011-07-31 MED ORDER — HEPARIN SODIUM (PORCINE) 5000 UNIT/ML IJ SOLN
5000.0000 [IU] | Freq: Once | INTRAMUSCULAR | Status: AC
  Administered 2011-07-31: 5000 [IU] via SUBCUTANEOUS

## 2011-07-31 MED ORDER — SODIUM CHLORIDE 0.9 % IV SOLN: 250.0000 mL | INTRAVENOUS | Status: DC | PRN

## 2011-07-31 MED ORDER — SODIUM CHLORIDE 0.9 % IJ SOLN: 3.0000 mL | INTRAMUSCULAR | Status: DC | PRN

## 2011-07-31 MED ORDER — ACETAMINOPHEN 650 MG RE SUPP: 650.0000 mg | RECTAL | Status: DC | PRN

## 2011-07-31 MED ORDER — ACETAMINOPHEN 325 MG PO TABS: 650.0000 mg | ORAL_TABLET | ORAL | Status: DC | PRN

## 2011-07-31 MED ORDER — PROPOFOL 10 MG/ML IV EMUL
INTRAVENOUS | Status: DC | PRN
  Administered 2011-07-31: 200 mg via INTRAVENOUS

## 2011-07-31 MED ORDER — ONDANSETRON HCL 4 MG/2ML IJ SOLN: 4.0000 mg | Freq: Four times a day (QID) | INTRAMUSCULAR | Status: DC | PRN

## 2011-07-31 MED ORDER — DEXAMETHASONE SODIUM PHOSPHATE 4 MG/ML IJ SOLN
INTRAMUSCULAR | Status: DC | PRN
  Administered 2011-07-31: 10 mg via INTRAVENOUS

## 2011-07-31 MED ORDER — LACTATED RINGERS IV SOLN
INTRAVENOUS | Status: DC
  Administered 2011-07-31 (×3): via INTRAVENOUS

## 2011-07-31 MED ORDER — FENTANYL CITRATE 0.05 MG/ML IJ SOLN
50.0000 ug | INTRAMUSCULAR | Status: DC | PRN
  Administered 2011-07-31: 50 ug via INTRAVENOUS

## 2011-07-31 SURGICAL SUPPLY — 57 items
ADH SKN CLS APL DERMABOND .7 (GAUZE/BANDAGES/DRESSINGS) ×1
APPLIER CLIP 11 MED OPEN (CLIP)
APR CLP MED 11 20 MLT OPN (CLIP)
BLADE HEX COATED 2.75 (ELECTRODE) ×2 IMPLANT
BLADE SURG 15 STRL LF DISP TIS (BLADE) ×1 IMPLANT
BLADE SURG 15 STRL SS (BLADE) ×2
CANISTER SUCTION 1200CC (MISCELLANEOUS) ×2 IMPLANT
CHLORAPREP W/TINT 26ML (MISCELLANEOUS) ×2 IMPLANT
CLIP APPLIE 11 MED OPEN (CLIP) IMPLANT
CLOTH BEACON ORANGE TIMEOUT ST (SAFETY) ×2 IMPLANT
COVER MAYO STAND STRL (DRAPES) ×2 IMPLANT
COVER TABLE BACK 60X90 (DRAPES) ×2 IMPLANT
DECANTER SPIKE VIAL GLASS SM (MISCELLANEOUS) ×2 IMPLANT
DERMABOND ADVANCED (GAUZE/BANDAGES/DRESSINGS) ×1
DERMABOND ADVANCED .7 DNX12 (GAUZE/BANDAGES/DRESSINGS) ×2 IMPLANT
DRAIN CHANNEL 19F RND (DRAIN) IMPLANT
DRAIN HEMOVAC 1/8 X 5 (WOUND CARE) IMPLANT
DRAPE LAPAROTOMY TRNSV 102X78 (DRAPE) ×2 IMPLANT
DRAPE UTILITY XL STRL (DRAPES) ×2 IMPLANT
ELECT REM PT RETURN 9FT ADLT (ELECTROSURGICAL) ×2
ELECTRODE REM PT RTRN 9FT ADLT (ELECTROSURGICAL) ×1 IMPLANT
EVACUATOR SILICONE 100CC (DRAIN) IMPLANT
GLOVE ECLIPSE 6.5 STRL STRAW (GLOVE) ×1 IMPLANT
GLOVE EUDERMIC 7 POWDERFREE (GLOVE) ×2 IMPLANT
GLOVE SKINSENSE NS SZ7.0 (GLOVE) ×1
GLOVE SKINSENSE STRL SZ7.0 (GLOVE) IMPLANT
GOWN PREVENTION PLUS XLARGE (GOWN DISPOSABLE) ×2 IMPLANT
GOWN PREVENTION PLUS XXLARGE (GOWN DISPOSABLE) ×2 IMPLANT
HEMOSTAT SURGICEL 2X14 (HEMOSTASIS) ×1 IMPLANT
NDL HYPO 25X1 1.5 SAFETY (NEEDLE) ×1 IMPLANT
NEEDLE HYPO 22GX1.5 SAFETY (NEEDLE) IMPLANT
NEEDLE HYPO 25X1 1.5 SAFETY (NEEDLE) ×4 IMPLANT
NS IRRIG 1000ML POUR BTL (IV SOLUTION) ×2 IMPLANT
PACK BASIN DAY SURGERY FS (CUSTOM PROCEDURE TRAY) ×2 IMPLANT
PENCIL BUTTON HOLSTER BLD 10FT (ELECTRODE) ×2 IMPLANT
SLEEVE SCD COMPRESS KNEE MED (MISCELLANEOUS) ×1 IMPLANT
SPONGE INTESTINAL PEANUT (DISPOSABLE) IMPLANT
SPONGE LAP 4X18 X RAY DECT (DISPOSABLE) ×2 IMPLANT
STAPLER VISISTAT 35W (STAPLE) ×1 IMPLANT
SUT ETHILON 3 0 PS 1 (SUTURE) IMPLANT
SUT MON AB 4-0 PC3 18 (SUTURE) ×2 IMPLANT
SUT SILK 3 0 SH 30 (SUTURE) IMPLANT
SUT VIC AB 2-0 SH 27 (SUTURE)
SUT VIC AB 2-0 SH 27XBRD (SUTURE) IMPLANT
SUT VIC AB 3-0 FS2 27 (SUTURE) IMPLANT
SUT VIC AB 3-0 SH 27 (SUTURE)
SUT VIC AB 3-0 SH 27X BRD (SUTURE) IMPLANT
SUT VICRYL 3-0 CR8 SH (SUTURE) ×1 IMPLANT
SUT VICRYL 4-0 PS2 18IN ABS (SUTURE) IMPLANT
SUT VICRYL AB 2 0 TIE (SUTURE) IMPLANT
SUT VICRYL AB 2 0 TIES (SUTURE)
SYR CONTROL 10ML LL (SYRINGE) ×3 IMPLANT
TOWEL OR 17X24 6PK STRL BLUE (TOWEL DISPOSABLE) ×4 IMPLANT
TOWEL OR NON WOVEN STRL DISP B (DISPOSABLE) ×2 IMPLANT
TUBE CONNECTING 20X1/4 (TUBING) ×2 IMPLANT
WATER STERILE IRR 1000ML POUR (IV SOLUTION) ×1 IMPLANT
YANKAUER SUCT BULB TIP NO VENT (SUCTIONS) ×2 IMPLANT

## 2011-07-31 NOTE — Anesthesia Procedure Notes (Signed)
Procedure Name: LMA Insertion Date/Time: 07/31/2011 2:24 PM Performed by: Signa Kell Pre-anesthesia Checklist: Patient identified, Timeout performed, Emergency Drugs available, Suction available and Patient being monitored Patient Re-evaluated:Patient Re-evaluated prior to inductionOxygen Delivery Method: Circle System Utilized Preoxygenation: Pre-oxygenation with 100% oxygen Intubation Type: IV induction Ventilation: Mask ventilation without difficulty LMA: LMA with gastric port inserted LMA Size: 4.0 Number of attempts: 1

## 2011-07-31 NOTE — Interval H&P Note (Signed)
History and Physical Interval Note:  07/31/2011 2:12 PM  [name]  has presented today for surgery, with the diagnosis of left breast cancer  The goals of treatment and the various methods of treatment have been discussed with the patient and family. After consideration of risks, benefits and other options for treatment, the patient has consented to  Procedure(s): LEFT AXILLARY SENTINAL NODE BIOPSY as a surgical intervention .  The patients' history has been reviewed today,  the patient examined today, there is no change in status, she is stable for surgery.  I have reviewed the patients' chart and labs.  Questions were answered to the patient's satisfaction.     [name] 07/31/2011 2:13 PM

## 2011-07-31 NOTE — Progress Notes (Signed)
Emotional support during sentinel node injections

## 2011-07-31 NOTE — Anesthesia Preprocedure Evaluation (Signed)
Anesthesia Evaluation  Patient identified by MRN, date of birth, ID band Patient awake    Reviewed: Allergy & Precautions, H&P , NPO status , Patient's Chart, lab work & pertinent test results, reviewed documented beta blocker date and time   Airway Mallampati: II TM Distance: >3 FB Neck ROM: full    Dental   Pulmonary shortness of breath and with exertion, COPD         Cardiovascular hypertension, + angina with exertion + CAD and + Past MI     Neuro/Psych  Headaches, PSYCHIATRIC DISORDERS    GI/Hepatic negative GI ROS, Neg liver ROS, GERD-  Medicated and Controlled,  Endo/Other  Morbid obesity  Renal/GU negative Renal ROS  Genitourinary negative   Musculoskeletal   Abdominal   Peds  Hematology negative hematology ROS (+)   Anesthesia Other Findings See surgeon's H&P   Reproductive/Obstetrics negative OB ROS                           Anesthesia Physical Anesthesia Plan  ASA: III  Anesthesia Plan: General   Post-op Pain Management:    Induction: Intravenous  Airway Management Planned: LMA  Additional Equipment:   Intra-op Plan:   Post-operative Plan: Extubation in OR  Informed Consent: I have reviewed the patients History and Physical, chart, labs and discussed the procedure including the risks, benefits and alternatives for the proposed anesthesia with the patient or authorized representative who has indicated his/her understanding and acceptance.     Plan Discussed with: CRNA and Surgeon  Anesthesia Plan Comments:         Anesthesia Quick Evaluation

## 2011-07-31 NOTE — Op Note (Signed)
Patient Name:           [name]   Date of Surgery:        07/31/2011  Pre op Diagnosis:      Invasive ductal carcinoma left breast, clinical  stage T1a., N0, receptor positive, HER-2/neu negative.  Post op Diagnosis:    Same, status post left partial mastectomy  Procedure:                 Inject blue dye left breast, left axillary sentinel node biopsy  Surgeon:                     Edsel Petrin. Dalbert Batman, M.D., FACS  Assistant:                      none  Operative Indications:   This is a 56 year old Caucasian female who recently had screening mammograms which showed a focal area of architectural distortion in the 10:00 position of the left breast. Ultrasound-guided biopsy showed benign breast tissue and microcalcifications, but it was felt that this was not concordant with the imaging findings. She was referred to me. On 06/30/2011 I performed a left partial mastectomy with bracketed needle localization, and the final pathology report showed a 3 mm invasive duct carcinoma and lobular neoplasia, pathologic stage T1a. There were good margins. Receptors were positive. HER-2 negative, Ki-67 9%. I have counseled her as an outpatient and discussed all of her options. We have elected to proceed with left axillary sentinel lymph biopsy.  Operative Findings:       I was able to find a single sentinel node which had a great deal radioactivity and blue dye in it. I found almost no further radioactivity thereafter, and so we only sent a single sentinel node.  Procedure in Detail:          The patient was brought to the holding area of [facility], and the nuclear medicine technician injected radionuclide into the left breast. The patient was taken to the operating room and general anesthesia was induced. A timeout was held. Following a sterile alcohol prep I injected 5 cc of blue dye into the left breast, subareolar area. This was a solution of 2 cc of methylene blue mixed with 3 cc of saline. The  breast was massaged for 5 minutes. The left breast and left axilla were prepped and draped in a sterile fashion.  0.5% Marcaine with epinephrine was used as a local infiltration anesthetic.  Using the Neoprobe as a guide I made a transverse incision in the left axilla at the hairline. Dissection was carried down through her subcutaneous tissue and then the clavi- pectoral fascia was incised into the axilla. This was a very deep wound.  Using the neoprobe I found one very hot, very blue lymph node this was removed. I spent about 5 minutes listening and looking and did not see any other sentinel lymph nodes. The wound was irrigated with saline. Hemostasis was excellent. The deeper tissues were closed with interrupted sutures of 3-0 Vicryl and the skin closed with a running subcuticular suture of 4-0 Monocryl and Dermabond. The patient tolerated the procedure well. Complications none. Counts correct. EBL 10 cc.     Edsel Petrin. Dalbert Batman, M.D., FACS General and Minimally Invasive Surgery Breast and Colorectal Surgery  07/31/2011 3:14 PM

## 2011-07-31 NOTE — Transfer of Care (Signed)
Immediate Anesthesia Transfer of Care Note  Patient: [name]  Procedure(s) Performed:  AXILLARY LYMPH NODE DISSECTION - left axillary sentinal node biopsy  Patient Location: PACU  Anesthesia Type: General  Level of Consciousness: sedated  Airway & Oxygen Therapy: Patient Spontanous Breathing and Patient connected to face mask oxygen  Post-op Assessment: Report given to PACU RN and Post -op Vital signs reviewed and stable  Post vital signs: Reviewed and stable  Complications: No apparent anesthesia complications

## 2011-07-31 NOTE — Anesthesia Postprocedure Evaluation (Signed)
  Anesthesia Post-op Note  Patient: [name]  Procedure(s) Performed:  AXILLARY LYMPH NODE DISSECTION - left axillary sentinal node biopsy  Patient Location: PACU  Anesthesia Type: General  Level of Consciousness: awake  Airway and Oxygen Therapy: Patient Spontanous Breathing and Patient connected to face mask oxygen  Post-op Pain: mild  Post-op Assessment: Post-op Vital signs reviewed, Patient's Cardiovascular Status Stable, Respiratory Function Stable and Patent Airway  Post-op Vital Signs: stable  Complications: No apparent anesthesia complications

## 2011-08-03 ENCOUNTER — Telehealth (INDEPENDENT_AMBULATORY_CARE_PROVIDER_SITE_OTHER): Payer: Self-pay

## 2011-08-03 ENCOUNTER — Encounter (HOSPITAL_BASED_OUTPATIENT_CLINIC_OR_DEPARTMENT_OTHER): Payer: Self-pay | Admitting: General Surgery

## 2011-08-03 NOTE — Telephone Encounter (Signed)
Pt notified of path result. Pt has appt with rad onc and onc already made.

## 2011-08-03 NOTE — Progress Notes (Signed)
Encounter addended by: Andria Rhein, RN on: 08/03/2011  2:25 PM<BR>     Documentation filed: Charges VN

## 2011-08-05 ENCOUNTER — Ambulatory Visit: Payer: Medicare HMO

## 2011-08-05 ENCOUNTER — Ambulatory Visit: Payer: Medicare HMO | Admitting: Radiation Oncology

## 2011-08-06 ENCOUNTER — Ambulatory Visit
Admission: RE | Admit: 2011-08-06 | Discharge: 2011-08-06 | Disposition: A | Discharge status: Home / Self Care | Payer: Medicare HMO | Source: Ambulatory Visit | Attending: Radiation Oncology | Admitting: Radiation Oncology

## 2011-08-06 DIAGNOSIS — C50219 Malignant neoplasm of upper-inner quadrant of unspecified female breast: Secondary | ICD-10-CM

## 2011-08-06 NOTE — Progress Notes (Signed)
Met with patient to discuss RO billing.   Patient had no concerns today.  Will begin pre-cert process for [facility] - tracking [id number]. 08/10/11 rec'd Josem Kaufmann [id number] for CPT [phone] to begin 08/24/11

## 2011-08-11 ENCOUNTER — Other Ambulatory Visit: Payer: Self-pay | Admitting: Oncology

## 2011-08-11 ENCOUNTER — Other Ambulatory Visit (HOSPITAL_BASED_OUTPATIENT_CLINIC_OR_DEPARTMENT_OTHER): Payer: Medicare HMO | Admitting: Lab

## 2011-08-11 ENCOUNTER — Ambulatory Visit: Payer: Medicare HMO | Admitting: Oncology

## 2011-08-11 DIAGNOSIS — D751 Secondary polycythemia: Secondary | ICD-10-CM

## 2011-08-11 DIAGNOSIS — E559 Vitamin D deficiency, unspecified: Secondary | ICD-10-CM

## 2011-08-11 DIAGNOSIS — C50919 Malignant neoplasm of unspecified site of unspecified female breast: Secondary | ICD-10-CM

## 2011-08-11 LAB — CBC & DIFF AND RETIC
BASO%: 0.3 % (ref 0.0–2.0)
EOS%: 2 % (ref 0.0–7.0)
LYMPH%: 13.5 % — ABNORMAL LOW (ref 14.0–49.7)
MCHC: 34.1 g/dL (ref 31.5–36.0)
MONO#: 0.7 10*3/uL (ref 0.1–0.9)
Platelets: 170 10*3/uL (ref 145–400)
RBC: 5.31 10*6/uL (ref 3.70–5.45)
Retic %: 1.36 % (ref 0.70–2.10)
WBC: 12.2 10*3/uL — ABNORMAL HIGH (ref 3.9–10.3)

## 2011-08-11 MED ORDER — ANASTROZOLE 1 MG PO TABS: 1.0000 mg | ORAL_TABLET | Freq: Every day | ORAL | Status: AC

## 2011-08-11 NOTE — Progress Notes (Signed)
  PROGRESS NOTE  [name] returns after her sentinel node  Biopsy which was negative . She has seen Dr Lisbeth Renshaw and will start xrt soon. I have given her information re-arimidex  And s/e; with instructions on starting after xrt has been completed.  Eston Esters md

## 2011-08-11 NOTE — Patient Instructions (Signed)

## 2011-08-12 ENCOUNTER — Telehealth: Payer: Self-pay | Admitting: *Deleted

## 2011-08-12 LAB — VITAMIN D 25 HYDROXY (VIT D DEFICIENCY, FRACTURES): Vit D, 25-Hydroxy: 13 ng/mL — ABNORMAL LOW (ref 30–89)

## 2011-08-12 LAB — URIC ACID: Uric Acid, Serum: 6 mg/dL (ref 2.4–7.0)

## 2011-08-12 LAB — LACTATE DEHYDROGENASE: LDH: 150 U/L (ref 94–250)

## 2011-08-12 NOTE — Telephone Encounter (Signed)
Called pt.  Labs from 08/11/11 show low Vitamin d level.  She is to start on vitamin d3   2000 iu daily.

## 2011-08-13 ENCOUNTER — Ambulatory Visit (INDEPENDENT_AMBULATORY_CARE_PROVIDER_SITE_OTHER): Payer: Medicare HMO | Admitting: General Surgery

## 2011-08-13 ENCOUNTER — Encounter (INDEPENDENT_AMBULATORY_CARE_PROVIDER_SITE_OTHER): Payer: Self-pay | Admitting: General Surgery

## 2011-08-13 VITALS — BP 120/78 | HR 68 | Temp 98.0°F | Resp 18 | Ht 65.0 in | Wt 205.0 lb

## 2011-08-13 DIAGNOSIS — Z9889 Other specified postprocedural states: Secondary | ICD-10-CM

## 2011-08-13 NOTE — Patient Instructions (Signed)
Your left breast and left axillary surgical scars are healing nicely without any problem. I encourage you to begin to exercise a little bit more and tried to stretch your  shoulder out all the way. You may bathe in whatever way you wish.  I agree with radiation therapy and I think that it is safe to go ahead with that starting at the end of this month as planned.  Return to see me in 3 months.

## 2011-08-13 NOTE — Progress Notes (Signed)
Subjective:     Patient ID: [name], female   DOB: Jun 28, 1956, 56 y.o.   MRN: [id number]  HPI This patient has undergone left partial mastectomy and left axillary sentinel lymph node biopsy. She has a receptor positive invasive ductal carcinoma, Ki-67 9%, pathologic stage T1a, N0. Her wounds are healing nicely. She has minimal pain.  She has seen Dr. Kyung Rudd who plans to start her radiation therapy January 28. She has seen Dr. Eston Esters who has given her prescription for arimidex and asked to start after radiation therapy. Review of Systems     Objective:   Physical Exam Patient looks well in no distress. Perhaps a little depressed with a flattened affect.  Breast left breast and left axillary incisions are healing nicely. No signs of hematoma or infection.    Assessment:     Invasive ductal carcinoma left breast, receptor positive, Ki-67 9%, stage TI A., N0.  Recovering uneventfully following partial mastectomy and sentinel biopsy.   Plan:     Activities and wound care discussed.  Agree with proceeding with radiation therapy as planned, starting January 28.  Return to see me in 3 months.    Edsel Petrin. Dalbert Batman, M.D., [facility] General and Minimally invasive Surgery Breast and Colorectal Surgery Office:   [phone] Pager:   [phone]

## 2011-08-14 LAB — JAK-2 V617F

## 2011-08-20 ENCOUNTER — Ambulatory Visit
Admission: RE | Admit: 2011-08-20 | Discharge: 2011-08-20 | Disposition: A | Discharge status: Home / Self Care | Payer: Medicare HMO | Source: Ambulatory Visit | Attending: Radiation Oncology | Admitting: Radiation Oncology

## 2011-08-20 ENCOUNTER — Ambulatory Visit
Admission: RE | Admit: 2011-08-20 | Discharge: 2011-08-20 | Disposition: A | Discharge status: Home / Self Care | Payer: Medicare HMO | Source: Ambulatory Visit | Attending: Oncology | Admitting: Oncology

## 2011-08-20 DIAGNOSIS — C50119 Malignant neoplasm of central portion of unspecified female breast: Secondary | ICD-10-CM

## 2011-08-20 DIAGNOSIS — E559 Vitamin D deficiency, unspecified: Secondary | ICD-10-CM

## 2011-08-20 DIAGNOSIS — D751 Secondary polycythemia: Secondary | ICD-10-CM

## 2011-08-21 NOTE — Procedures (Signed)
DIAGNOSIS:  Breast cancer involving the left breast.  NARRATIVE:  [name] presented for port films prior to beginning her course of radiation treatment.  The isocenter is accurately placed, and the blocks appropriately delineate the target treatment region. Therefore, we will proceed with her treatment as planned.    ______________________________ Jodelle Gross, M.D., Ph.D. JSM/MEDQ  D:  08/20/2011  T:  08/21/2011  Job:  [id number]

## 2011-08-24 ENCOUNTER — Ambulatory Visit
Admission: RE | Admit: 2011-08-24 | Discharge: 2011-08-24 | Disposition: A | Discharge status: Home / Self Care | Payer: Medicare HMO | Source: Ambulatory Visit | Attending: Radiation Oncology | Admitting: Radiation Oncology

## 2011-08-24 DIAGNOSIS — C50119 Malignant neoplasm of central portion of unspecified female breast: Secondary | ICD-10-CM

## 2011-08-24 MED ORDER — ALRA NON-METALLIC DEODORANT (RAD-ONC)
1.0000 "application " | Freq: Once | TOPICAL | Status: AC
  Administered 2011-08-24: 1 via TOPICAL

## 2011-08-24 MED ORDER — RADIAPLEXRX EX GEL
Freq: Once | CUTANEOUS | Status: AC
  Administered 2011-08-24: 12:00:00 via TOPICAL

## 2011-08-24 NOTE — Progress Notes (Signed)
Patient presented to the clinic today unaccompanied for post sim education with Joaquim Lai, RN. Patient is alert and oriented to person, place, and time. No distress noted. Steady gait noted. Pleasant affect noted. Patient denies pain at this time. Oriented patient to staff and routine of the clinic. Provided patient RADIATION THERAPY AND YOU handbook. Reviewed pertinent information within the handbook with the patient. Provided patient with Radiaplex and Alra then, reviewed use. Provided patient with business card and encouraged patient to call with needs. Patient verbalized understanding of all things reviewed.

## 2011-08-25 ENCOUNTER — Ambulatory Visit
Admission: RE | Admit: 2011-08-25 | Discharge: 2011-08-25 | Disposition: A | Discharge status: Home / Self Care | Payer: Medicare HMO | Source: Ambulatory Visit | Attending: Radiation Oncology | Admitting: Radiation Oncology

## 2011-08-26 ENCOUNTER — Ambulatory Visit
Admission: RE | Admit: 2011-08-26 | Discharge: 2011-08-26 | Disposition: A | Discharge status: Home / Self Care | Payer: Medicare HMO | Source: Ambulatory Visit | Attending: Radiation Oncology | Admitting: Radiation Oncology

## 2011-08-27 ENCOUNTER — Ambulatory Visit
Admission: RE | Admit: 2011-08-27 | Discharge: 2011-08-27 | Disposition: A | Discharge status: Home / Self Care | Payer: Medicare HMO | Source: Ambulatory Visit | Attending: Radiation Oncology | Admitting: Radiation Oncology

## 2011-08-27 ENCOUNTER — Encounter: Payer: Self-pay | Admitting: Radiation Oncology

## 2011-08-27 ENCOUNTER — Other Ambulatory Visit: Payer: Self-pay | Admitting: Cardiovascular Disease

## 2011-08-27 VITALS — Wt 209.1 lb

## 2011-08-27 DIAGNOSIS — C50219 Malignant neoplasm of upper-inner quadrant of unspecified female breast: Secondary | ICD-10-CM

## 2011-08-27 MED ORDER — LISINOPRIL 5 MG PO TABS: 5.0000 mg | ORAL_TABLET | Freq: Every day | ORAL | Status: DC

## 2011-08-27 NOTE — Telephone Encounter (Signed)
Refill   Please call patient at hm# as she has additional questions regarding these prescriptions

## 2011-08-27 NOTE — Progress Notes (Signed)
[facility] Weekly Treatment Note    Name: [name] Date: 08/27/2011 MRN: [id number] DOB: May 13, 1956  Status: outpatient    Current dose: 720  Current fraction:4  Planned [id number]  Planned fraction:33   MEDICATIONS: Current Outpatient Prescriptions  Medication Sig Dispense Refill  . albuterol (PROVENTIL) (5 MG/ML) 0.5% nebulizer solution Take 2.5 mg by nebulization every 6 (six) hours as needed. For shortness of breath      . ALPRAZolam (XANAX) 0.5 MG tablet Take 0.5 mg by mouth daily as needed. For anxiety.      [name] anastrozole (ARIMIDEX) 1 MG tablet Take 1 tablet (1 mg total) by mouth daily.  30 tablet  6  . aspirin 325 MG tablet Take 325 mg by mouth daily.        . B Complex-C (SUPER B COMPLEX PO) Take 1 tablet by mouth daily.       . Cholecalciferol (VITAMIN D-3 PO) Take 2,000 Units by mouth daily.      . clonazePAM (KLONOPIN) 0.5 MG tablet Take 1 mg by mouth at bedtime as needed. For sleep      . fish oil-omega-3 fatty acids 1000 MG capsule Take 2 g by mouth 2 (two) times daily.       [name] HYDROcodone-acetaminophen (NORCO) 5-325 MG per tablet       . lisinopril (PRINIVIL,ZESTRIL) 5 MG tablet Take 5 mg by mouth daily. PM      . metoprolol tartrate (LOPRESSOR) 25 MG tablet Take 1 tablet (25 mg total) by mouth 2 (two) times daily.  60 tablet  11  . non-metallic deodorant (ALRA) MISC Apply 1 application topically daily as needed.      [name] oxyCODONE-acetaminophen (PERCOCET) 5-325 MG per tablet Take 1 tablet by mouth every 4 (four) hours as needed.        [name] PARoxetine (PAXIL) 20 MG tablet Take 60 mg by mouth daily. 3 tabs daily - PM      . pravastatin (PRAVACHOL) 40 MG tablet Take 40 mg by mouth daily. PM      . Wound Cleansers (RADIAPLEX EX) Apply topically.         ALLERGIES: Food   LABORATORY DATA:  Lab Results  Component Value Date   WBC 12.2* 08/11/2011   HGB 17.0* 08/11/2011   HCT 49.9* 08/11/2011   MCV 94.0 08/11/2011   PLT 170  08/11/2011   Lab Results  Component Value Date   NA 144 07/31/2011   K 4.3 07/31/2011   CL 108 07/31/2011   CO2 24 07/20/2011   Lab Results  Component Value Date   ALT 14 07/20/2011   AST 16 07/20/2011   ALKPHOS 67 07/20/2011   BILITOT 0.3 07/20/2011      NARRATIVE: [name] was seen today for weekly treatment management. The chart was checked and port films images were reviewed. Pt doing well. Occasional headaches.  PHYSICAL EXAMINATION: weight is 209 lb 1.6 oz (94.847 kg).        ASSESSMENT: Patient tolerating treatments well.    PLAN: Continue treatment as planned.

## 2011-08-27 NOTE — Progress Notes (Signed)
Pt states she has had headache this week; has hx sinus issues and migraines. She has not taken any med for headache, states "not as bad today".  Advised she call PCP if she develops sore throat, fever, cough, sneezing. Pt verbalized understanding.

## 2011-08-28 ENCOUNTER — Ambulatory Visit
Admission: RE | Admit: 2011-08-28 | Discharge: 2011-08-28 | Disposition: A | Discharge status: Home / Self Care | Payer: Medicare HMO | Source: Ambulatory Visit | Attending: Radiation Oncology | Admitting: Radiation Oncology

## 2011-08-31 ENCOUNTER — Ambulatory Visit
Admission: RE | Admit: 2011-08-31 | Discharge: 2011-08-31 | Disposition: A | Discharge status: Home / Self Care | Payer: Medicare HMO | Source: Ambulatory Visit | Attending: Radiation Oncology | Admitting: Radiation Oncology

## 2011-09-01 ENCOUNTER — Ambulatory Visit
Admission: RE | Admit: 2011-09-01 | Discharge: 2011-09-01 | Disposition: A | Discharge status: Home / Self Care | Payer: Medicare HMO | Source: Ambulatory Visit | Attending: Radiation Oncology | Admitting: Radiation Oncology

## 2011-09-02 ENCOUNTER — Encounter: Payer: Self-pay | Admitting: Oncology

## 2011-09-02 ENCOUNTER — Ambulatory Visit
Admission: RE | Admit: 2011-09-02 | Discharge: 2011-09-02 | Disposition: A | Discharge status: Home / Self Care | Payer: Medicare HMO | Source: Ambulatory Visit | Attending: Radiation Oncology | Admitting: Radiation Oncology

## 2011-09-03 ENCOUNTER — Ambulatory Visit
Admission: RE | Admit: 2011-09-03 | Discharge: 2011-09-03 | Disposition: A | Discharge status: Home / Self Care | Payer: Medicare HMO | Source: Ambulatory Visit | Attending: Radiation Oncology | Admitting: Radiation Oncology

## 2011-09-03 ENCOUNTER — Encounter: Payer: Self-pay | Admitting: Radiation Oncology

## 2011-09-03 DIAGNOSIS — C50219 Malignant neoplasm of upper-inner quadrant of unspecified female breast: Secondary | ICD-10-CM

## 2011-09-03 NOTE — Progress Notes (Signed)
Pt reports fatigue.  States L breast "gets tingly at times and has gotten smaller". Advised her radiation does not typically reduce size of breast, but she will discuss w/dr today.

## 2011-09-04 ENCOUNTER — Ambulatory Visit
Admission: RE | Admit: 2011-09-04 | Discharge: 2011-09-04 | Disposition: A | Discharge status: Home / Self Care | Payer: Medicare HMO | Source: Ambulatory Visit | Attending: Radiation Oncology | Admitting: Radiation Oncology

## 2011-09-04 NOTE — Progress Notes (Signed)
DIAGNOSIS:  Left-sided breast cancer.  NARRATIVE:  [name] presented for simulation for her upcoming course of adjuvant radiotherapy.  The patient was placed in a supine position on a breast board, and a customized AccuForm device was constructed to aid in patient immobilization during her treatment.  In this fashion, a CT scan was obtained through the breast region, and an isocenter was placed within the left breast near the chest wall.  This complex treatment device for immobilization will be used on a daily basis during her course of radiation.  Ms. Kitzinger initially will be planned to receive a course of radiation corresponding to 50.4 Gy at 1.8 Gy per fraction.  To accomplish this, two customized blocks have been designed, and the tumor seroma target cavity has been contoured.  Additionally, the heart and lungs have also been contoured as adjacent normal structures.  A 3D conformal technique will be used for the patient's initial plan.  This is necessary to ensure that the seroma target has received an adequate dose of radiation and to ensure that the heart and lungs are adequately spared.  A forward planning technique will also be evaluated.  It is anticipated that the patient will then receive a 10 Gy boost in 5 fractions at 2 Gy per fraction subsequently to the seroma plus margin.  This will yield a final total dose of 60.4 Gy.    ______________________________ Jodelle Gross, M.D., Ph.D. JSM/MEDQ  D:  09/03/2011  T:  09/04/2011  Job:  [id number]

## 2011-09-07 ENCOUNTER — Ambulatory Visit
Admission: RE | Admit: 2011-09-07 | Discharge: 2011-09-07 | Disposition: A | Discharge status: Home / Self Care | Payer: Medicare HMO | Source: Ambulatory Visit | Attending: Radiation Oncology | Admitting: Radiation Oncology

## 2011-09-08 ENCOUNTER — Ambulatory Visit
Admission: RE | Admit: 2011-09-08 | Discharge: 2011-09-08 | Disposition: A | Discharge status: Home / Self Care | Payer: Medicare HMO | Source: Ambulatory Visit | Attending: Radiation Oncology | Admitting: Radiation Oncology

## 2011-09-09 ENCOUNTER — Ambulatory Visit
Admission: RE | Admit: 2011-09-09 | Discharge: 2011-09-09 | Disposition: A | Discharge status: Home / Self Care | Payer: Medicare HMO | Source: Ambulatory Visit | Attending: Radiation Oncology | Admitting: Radiation Oncology

## 2011-09-10 ENCOUNTER — Ambulatory Visit
Admission: RE | Admit: 2011-09-10 | Discharge: 2011-09-10 | Disposition: A | Discharge status: Home / Self Care | Payer: Medicare HMO | Source: Ambulatory Visit | Attending: Radiation Oncology | Admitting: Radiation Oncology

## 2011-09-10 ENCOUNTER — Encounter: Payer: Self-pay | Admitting: Radiation Oncology

## 2011-09-10 VITALS — Wt 210.3 lb

## 2011-09-10 DIAGNOSIS — C50219 Malignant neoplasm of upper-inner quadrant of unspecified female breast: Secondary | ICD-10-CM

## 2011-09-10 NOTE — Progress Notes (Signed)
[facility] Weekly Treatment Note    Name: [name] Date: 09/10/2011 MRN: [id number] DOB: [birth year]  Status: outpatient    Current dose: 2520  Current fraction:14  Planned [id number]  Planned fraction:33   MEDICATIONS: Current Outpatient Prescriptions  Medication Sig Dispense Refill  . albuterol (PROVENTIL) (5 MG/ML) 0.5% nebulizer solution Take 2.5 mg by nebulization every 6 (six) hours as needed. For shortness of breath      . ALPRAZolam (XANAX) 0.5 MG tablet Take 0.5 mg by mouth daily as needed. For anxiety.      [name] anastrozole (ARIMIDEX) 1 MG tablet Take 1 tablet (1 mg total) by mouth daily.  30 tablet  6  . aspirin 325 MG tablet Take 325 mg by mouth daily.        . B Complex-C (SUPER B COMPLEX PO) Take 1 tablet by mouth daily.       . Cholecalciferol (VITAMIN D-3 PO) Take 2,000 Units by mouth daily.      . clonazePAM (KLONOPIN) 0.5 MG tablet Take 1 mg by mouth at bedtime as needed. For sleep      . fish oil-omega-3 fatty acids 1000 MG capsule Take 2 g by mouth 2 (two) times daily.       [name] HYDROcodone-acetaminophen (NORCO) 5-325 MG per tablet       . lisinopril (PRINIVIL,ZESTRIL) 5 MG tablet Take 1 tablet (5 mg total) by mouth daily. PM  30 tablet  6  . metoprolol tartrate (LOPRESSOR) 25 MG tablet Take 1 tablet (25 mg total) by mouth 2 (two) times daily.  60 tablet  11  . PARoxetine (PAXIL) 20 MG tablet Take 60 mg by mouth daily. 3 tabs daily - PM      . pravastatin (PRAVACHOL) 40 MG tablet Take 40 mg by mouth daily. PM      . Wound Cleansers (RADIAPLEX EX) Apply topically.         ALLERGIES: Food   LABORATORY DATA:  Lab Results  Component Value Date   WBC 12.2* 08/11/2011   HGB 17.0* 08/11/2011   HCT 49.9* 08/11/2011   MCV 94.0 08/11/2011   PLT 170 08/11/2011   Lab Results  Component Value Date   NA 144 07/31/2011   K 4.3 07/31/2011   CL 108 07/31/2011   CO2 24 07/20/2011   Lab Results  Component Value Date   ALT 14 07/20/2011    AST 16 07/20/2011   ALKPHOS 67 07/20/2011   BILITOT 0.3 07/20/2011      NARRATIVE: [name] was seen today for weekly treatment management. The chart was checked and port films images were reviewed. Pt doing well. Some tingling, irritation of skin.  PHYSICAL EXAMINATION: weight is 210 lb 4.8 oz (95.391 kg).     Diffuse erythema; no desquamation   ASSESSMENT: Patient tolerating treatments well.    PLAN: Continue treatment as planned.

## 2011-09-10 NOTE — Progress Notes (Signed)
Some fatigue. Slight erythema L breast , pt states "it sometimes feels tingly, like it's on fire". Pt applies Radiaplex w/good relief.

## 2011-09-11 ENCOUNTER — Ambulatory Visit
Admission: RE | Admit: 2011-09-11 | Discharge: 2011-09-11 | Disposition: A | Discharge status: Home / Self Care | Payer: Medicare HMO | Source: Ambulatory Visit | Attending: Radiation Oncology | Admitting: Radiation Oncology

## 2011-09-14 ENCOUNTER — Ambulatory Visit
Admission: RE | Admit: 2011-09-14 | Discharge: 2011-09-14 | Disposition: A | Discharge status: Home / Self Care | Payer: Medicare HMO | Source: Ambulatory Visit | Attending: Radiation Oncology | Admitting: Radiation Oncology

## 2011-09-15 ENCOUNTER — Ambulatory Visit
Admission: RE | Admit: 2011-09-15 | Discharge: 2011-09-15 | Disposition: A | Discharge status: Home / Self Care | Payer: Medicare HMO | Source: Ambulatory Visit | Attending: Radiation Oncology | Admitting: Radiation Oncology

## 2011-09-16 ENCOUNTER — Ambulatory Visit
Admission: RE | Admit: 2011-09-16 | Discharge: 2011-09-16 | Disposition: A | Discharge status: Home / Self Care | Payer: Medicare HMO | Source: Ambulatory Visit | Attending: Radiation Oncology | Admitting: Radiation Oncology

## 2011-09-17 ENCOUNTER — Ambulatory Visit
Admission: RE | Admit: 2011-09-17 | Discharge: 2011-09-17 | Disposition: A | Discharge status: Home / Self Care | Payer: Medicare HMO | Source: Ambulatory Visit | Attending: Radiation Oncology | Admitting: Radiation Oncology

## 2011-09-17 ENCOUNTER — Encounter: Payer: Self-pay | Admitting: Radiation Oncology

## 2011-09-17 VITALS — Wt 208.1 lb

## 2011-09-17 DIAGNOSIS — C50219 Malignant neoplasm of upper-inner quadrant of unspecified female breast: Secondary | ICD-10-CM

## 2011-09-17 NOTE — Progress Notes (Signed)
Pt has slight erythema left breast, uses radiaplex only at night, encouraged to use right after radiation treatment, can bring with her and put on after her treatment,pt gave verbal understanding, has burning/stinging in breast at night, an occasional zap throughout breast, has some fatigue, poor appetite, has had chronic headaches takes aleve or advil, pt is on blood pressure medication, pt was suggested to ask what else she should take, , she says "tylenol doesn't help"ask if they were migraine headaches,pt said "like migraines", also pt saysthrobbing  down shoulder and under  armstarted this week, after radiation,  11:34 AM

## 2011-09-18 ENCOUNTER — Ambulatory Visit
Admission: RE | Admit: 2011-09-18 | Discharge: 2011-09-18 | Disposition: A | Discharge status: Home / Self Care | Payer: Medicare HMO | Source: Ambulatory Visit | Attending: Radiation Oncology | Admitting: Radiation Oncology

## 2011-09-19 NOTE — Progress Notes (Signed)
[facility] Weekly Treatment Note    Name: [name] Date: 09/19/2011 MRN: [id number] DOB: 10-22-1955  Status: outpatient    Current dose: 3420  Current fraction:19  Planned [id number]  Planned fraction:33   MEDICATIONS: Current Outpatient Prescriptions  Medication Sig Dispense Refill  . albuterol (PROVENTIL) (5 MG/ML) 0.5% nebulizer solution Take 2.5 mg by nebulization every 6 (six) hours as needed. For shortness of breath      . ALPRAZolam (XANAX) 0.5 MG tablet Take 0.5 mg by mouth daily as needed. For anxiety.      [name] aspirin 325 MG tablet Take 325 mg by mouth daily.        . B Complex-C (SUPER B COMPLEX PO) Take 1 tablet by mouth daily.       . Cholecalciferol (VITAMIN D-3 PO) Take 2,000 Units by mouth daily.      . clonazePAM (KLONOPIN) 0.5 MG tablet Take 1 mg by mouth at bedtime as needed. For sleep      . fish oil-omega-3 fatty acids 1000 MG capsule Take 2 g by mouth 2 (two) times daily.       [name] HYDROcodone-acetaminophen (NORCO) 5-325 MG per tablet       . lisinopril (PRINIVIL,ZESTRIL) 5 MG tablet Take 1 tablet (5 mg total) by mouth daily. PM  30 tablet  6  . metoprolol tartrate (LOPRESSOR) 25 MG tablet Take 1 tablet (25 mg total) by mouth 2 (two) times daily.  60 tablet  11  . PARoxetine (PAXIL) 20 MG tablet Take 60 mg by mouth daily. 3 tabs daily - PM      . pravastatin (PRAVACHOL) 40 MG tablet Take 40 mg by mouth daily. PM      . Wound Cleansers (RADIAPLEX EX) Apply topically.         ALLERGIES: Food   LABORATORY DATA:  Lab Results  Component Value Date   WBC 12.2* 08/11/2011   HGB 17.0* 08/11/2011   HCT 49.9* 08/11/2011   MCV 94.0 08/11/2011   PLT 170 08/11/2011   Lab Results  Component Value Date   NA 144 07/31/2011   K 4.3 07/31/2011   CL 108 07/31/2011   CO2 24 07/20/2011   Lab Results  Component Value Date   ALT 14 07/20/2011   AST 16 07/20/2011   ALKPHOS 67 07/20/2011   BILITOT 0.3 07/20/2011      NARRATIVE:  [name] was seen today for weekly treatment management. The chart was checked and port films images were reviewed. Pt doing well overall. Some redness of skin, and some shooting pains. Some fatigue.  PHYSICAL EXAMINATION: weight is 208 lb 1.6 oz (94.394 kg).    Diffuse moderate erythema, no desquamation   ASSESSMENT: Patient tolerating treatments well.    PLAN: Continue treatment as planned.

## 2011-09-21 ENCOUNTER — Ambulatory Visit
Admission: RE | Admit: 2011-09-21 | Discharge: 2011-09-21 | Disposition: A | Discharge status: Home / Self Care | Payer: Medicare HMO | Source: Ambulatory Visit | Attending: Radiation Oncology | Admitting: Radiation Oncology

## 2011-09-22 ENCOUNTER — Ambulatory Visit
Admission: RE | Admit: 2011-09-22 | Discharge: 2011-09-22 | Disposition: A | Discharge status: Home / Self Care | Payer: Medicare HMO | Source: Ambulatory Visit | Attending: Radiation Oncology | Admitting: Radiation Oncology

## 2011-09-23 ENCOUNTER — Ambulatory Visit
Admission: RE | Admit: 2011-09-23 | Discharge: 2011-09-23 | Disposition: A | Discharge status: Home / Self Care | Payer: Medicare HMO | Source: Ambulatory Visit | Attending: Radiation Oncology | Admitting: Radiation Oncology

## 2011-09-24 ENCOUNTER — Ambulatory Visit
Admission: RE | Admit: 2011-09-24 | Discharge: 2011-09-24 | Disposition: A | Discharge status: Home / Self Care | Payer: Medicare HMO | Source: Ambulatory Visit | Attending: Radiation Oncology | Admitting: Radiation Oncology

## 2011-09-24 VITALS — Wt 210.8 lb

## 2011-09-24 DIAGNOSIS — C50219 Malignant neoplasm of upper-inner quadrant of unspecified female breast: Secondary | ICD-10-CM

## 2011-09-24 NOTE — Progress Notes (Signed)
Here for routine weekly MD visit for radiation of left breast. Mild redness with some stinging and burning.Has more intense demarcation of left mammary fold. To continue to apply radiaplex. Knows if skin peels may apply neosporin. Generalized fatigue.

## 2011-09-24 NOTE — Progress Notes (Signed)
[facility] Weekly Treatment Note    Name: [name] Date: 09/24/2011 MRN: [id number] DOB: 09/28/1955  Status: outpatient    Current dose: 4320  Current fraction:24  Planned [id number]  Planned fraction:33   MEDICATIONS: Current Outpatient Prescriptions  Medication Sig Dispense Refill  . albuterol (PROVENTIL) (5 MG/ML) 0.5% nebulizer solution Take 2.5 mg by nebulization every 6 (six) hours as needed. For shortness of breath      . ALPRAZolam (XANAX) 0.5 MG tablet Take 0.5 mg by mouth daily as needed. For anxiety.      [name] aspirin 325 MG tablet Take 325 mg by mouth daily.        . B Complex-C (SUPER B COMPLEX PO) Take 1 tablet by mouth daily.       . Cholecalciferol (VITAMIN D-3 PO) Take 2,000 Units by mouth daily.      . clonazePAM (KLONOPIN) 0.5 MG tablet Take 1 mg by mouth at bedtime as needed. For sleep      . fish oil-omega-3 fatty acids 1000 MG capsule Take 2 g by mouth 2 (two) times daily.       [name] HYDROcodone-acetaminophen (NORCO) 5-325 MG per tablet       . lisinopril (PRINIVIL,ZESTRIL) 5 MG tablet Take 1 tablet (5 mg total) by mouth daily. PM  30 tablet  6  . metoprolol tartrate (LOPRESSOR) 25 MG tablet Take 1 tablet (25 mg total) by mouth 2 (two) times daily.  60 tablet  11  . PARoxetine (PAXIL) 20 MG tablet Take 60 mg by mouth daily. 3 tabs daily - PM      . pravastatin (PRAVACHOL) 40 MG tablet Take 40 mg by mouth daily. PM      . Wound Cleansers (RADIAPLEX EX) Apply topically.         ALLERGIES: Food   LABORATORY DATA:  Lab Results  Component Value Date   WBC 12.2* 08/11/2011   HGB 17.0* 08/11/2011   HCT 49.9* 08/11/2011   MCV 94.0 08/11/2011   PLT 170 08/11/2011   Lab Results  Component Value Date   NA 144 07/31/2011   K 4.3 07/31/2011   CL 108 07/31/2011   CO2 24 07/20/2011   Lab Results  Component Value Date   ALT 14 07/20/2011   AST 16 07/20/2011   ALKPHOS 67 07/20/2011   BILITOT 0.3 07/20/2011      NARRATIVE:  [name] was seen today for weekly treatment management. The chart was checked and port films images were reviewed. Pt doing well. Some irritation/ a little pruritis.  PHYSICAL EXAMINATION: weight is 210 lb 12.8 oz (95.618 kg).   Skin looks good, some diffuse erythema. No desquamation  ASSESSMENT: Patient tolerating treatments well.    PLAN: Continue treatment as planned. Will continue current skin care.

## 2011-09-25 ENCOUNTER — Ambulatory Visit
Admission: RE | Admit: 2011-09-25 | Discharge: 2011-09-25 | Disposition: A | Discharge status: Home / Self Care | Payer: Medicare HMO | Source: Ambulatory Visit | Attending: Radiation Oncology | Admitting: Radiation Oncology

## 2011-09-28 ENCOUNTER — Ambulatory Visit
Admission: RE | Admit: 2011-09-28 | Discharge: 2011-09-28 | Disposition: A | Discharge status: Home / Self Care | Payer: Medicare HMO | Source: Ambulatory Visit | Attending: Radiation Oncology | Admitting: Radiation Oncology

## 2011-09-29 ENCOUNTER — Ambulatory Visit
Admission: RE | Admit: 2011-09-29 | Discharge: 2011-09-29 | Disposition: A | Discharge status: Home / Self Care | Payer: Medicare HMO | Source: Ambulatory Visit | Attending: Radiation Oncology | Admitting: Radiation Oncology

## 2011-09-30 ENCOUNTER — Ambulatory Visit
Admission: RE | Admit: 2011-09-30 | Discharge: 2011-09-30 | Disposition: A | Discharge status: Home / Self Care | Payer: Medicare HMO | Source: Ambulatory Visit | Attending: Radiation Oncology | Admitting: Radiation Oncology

## 2011-09-30 DIAGNOSIS — C50219 Malignant neoplasm of upper-inner quadrant of unspecified female breast: Secondary | ICD-10-CM

## 2011-10-01 ENCOUNTER — Ambulatory Visit
Admission: RE | Admit: 2011-10-01 | Discharge: 2011-10-01 | Disposition: A | Discharge status: Home / Self Care | Payer: Medicare HMO | Source: Ambulatory Visit | Attending: Radiation Oncology | Admitting: Radiation Oncology

## 2011-10-01 ENCOUNTER — Encounter: Payer: Self-pay | Admitting: Radiation Oncology

## 2011-10-01 VITALS — BP 111/72 | HR 59 | Temp 97.4°F | Resp 18 | Wt 209.7 lb

## 2011-10-01 DIAGNOSIS — C50219 Malignant neoplasm of upper-inner quadrant of unspecified female breast: Secondary | ICD-10-CM

## 2011-10-01 NOTE — Progress Notes (Signed)
[facility] Weekly Treatment Note    Name: [name] Date: 10/01/2011 MRN: [id number] DOB: [birth year]  Status: outpatient    Current dose: 52.4 gray  Current fraction: 29  Planned dose: 60.4 gray  Planned fraction: 33   MEDICATIONS: Current Outpatient Prescriptions  Medication Sig Dispense Refill  . albuterol (PROVENTIL) (5 MG/ML) 0.5% nebulizer solution Take 2.5 mg by nebulization every 6 (six) hours as needed. For shortness of breath      . ALPRAZolam (XANAX) 0.5 MG tablet Take 0.5 mg by mouth daily as needed. For anxiety.      [name] aspirin 325 MG tablet Take 325 mg by mouth daily.        . B Complex-C (SUPER B COMPLEX PO) Take 1 tablet by mouth daily.       . Cholecalciferol (VITAMIN D-3 PO) Take 2,000 Units by mouth daily.      . clonazePAM (KLONOPIN) 0.5 MG tablet Take 1 mg by mouth at bedtime as needed. For sleep      . fish oil-omega-3 fatty acids 1000 MG capsule Take 2 g by mouth 2 (two) times daily.       [name] HYDROcodone-acetaminophen (NORCO) 5-325 MG per tablet       . lisinopril (PRINIVIL,ZESTRIL) 5 MG tablet Take 1 tablet (5 mg total) by mouth daily. PM  30 tablet  6  . metoprolol tartrate (LOPRESSOR) 25 MG tablet Take 1 tablet (25 mg total) by mouth 2 (two) times daily.  60 tablet  11  . PARoxetine (PAXIL) 20 MG tablet Take 60 mg by mouth daily. 3 tabs daily - PM      . pravastatin (PRAVACHOL) 40 MG tablet Take 40 mg by mouth daily. PM      . Wound Cleansers (RADIAPLEX EX) Apply topically.         ALLERGIES: Food   LABORATORY DATA:  Lab Results  Component Value Date   WBC 12.2* 08/11/2011   HGB 17.0* 08/11/2011   HCT 49.9* 08/11/2011   MCV 94.0 08/11/2011   PLT 170 08/11/2011   Lab Results  Component Value Date   NA 144 07/31/2011   K 4.3 07/31/2011   CL 108 07/31/2011   CO2 24 07/20/2011   Lab Results  Component Value Date   ALT 14 07/20/2011   AST 16 07/20/2011   ALKPHOS 67 07/20/2011   BILITOT 0.3 07/20/2011      NARRATIVE: [name] was seen today for weekly treatment management. The chart was checked and port films images were reviewed. The patient is doing well with her treatment. She is having some skin irritation but no major changes.  PHYSICAL EXAMINATION: weight is 209 lb 11.2 oz (95.119 kg). Her oral temperature is 97.4 F (36.3 C). Her blood pressure is 111/72 and her pulse is 59. Her respiration is 18.     diffuse erythema is present with some slight desquamation under the left breast.   ASSESSMENT: Patient tolerating treatments well.    PLAN: Continue treatment as planned.

## 2011-10-01 NOTE — Progress Notes (Signed)
Patient presents to the clinic today unaccompanied for under treat visit with Dr. Lisbeth Renshaw. Patient is alert and oriented to person, place, and time. No distress noted. Steady gait noted. Pleasant affect noted. Patient denies pain at this time. Patient reports a sore throat since this morning. Patient reports that she is up and down during the night unable to sleep. Hyperpigmentation of the left breast noted. Peeling under the left breast noted. Patient reports using Radiaplex as directed. Encouraged to apply radiaplex to dry skin on left upper back. Reported all findings to Dr. Lisbeth Renshaw.

## 2011-10-01 NOTE — Progress Notes (Signed)
[facility] Simulation Verification Note   Name: [name] MRN: [id number]  Date: 10/01/2011  DOB: 07-17-1956  Status: outpatient    DIAGNOSIS: Breast cancer    NARRATIVE: The patient presented for simulation verification for her upcoming boost treatment. The isocenter is accurately placed. The lots appropriately delineate the target treatment area. Therefore we will proceed with her treatment as planned.

## 2011-10-02 ENCOUNTER — Ambulatory Visit
Admission: RE | Admit: 2011-10-02 | Discharge: 2011-10-02 | Disposition: A | Discharge status: Home / Self Care | Payer: Medicare HMO | Source: Ambulatory Visit | Attending: Radiation Oncology | Admitting: Radiation Oncology

## 2011-10-05 ENCOUNTER — Ambulatory Visit
Admission: RE | Admit: 2011-10-05 | Discharge: 2011-10-05 | Disposition: A | Discharge status: Home / Self Care | Payer: Medicare HMO | Source: Ambulatory Visit | Attending: Radiation Oncology | Admitting: Radiation Oncology

## 2011-10-06 ENCOUNTER — Ambulatory Visit
Admission: RE | Admit: 2011-10-06 | Discharge: 2011-10-06 | Disposition: A | Discharge status: Home / Self Care | Payer: Medicare HMO | Source: Ambulatory Visit | Attending: Radiation Oncology | Admitting: Radiation Oncology

## 2011-10-07 ENCOUNTER — Ambulatory Visit
Admission: RE | Admit: 2011-10-07 | Discharge: 2011-10-07 | Disposition: A | Discharge status: Home / Self Care | Payer: Medicare HMO | Source: Ambulatory Visit | Attending: Radiation Oncology | Admitting: Radiation Oncology

## 2011-10-07 ENCOUNTER — Encounter: Payer: Self-pay | Admitting: Radiation Oncology

## 2011-10-07 VITALS — Wt 212.1 lb

## 2011-10-07 DIAGNOSIS — C50219 Malignant neoplasm of upper-inner quadrant of unspecified female breast: Secondary | ICD-10-CM

## 2011-10-07 NOTE — Progress Notes (Signed)
Pt alert and oriented x 3, ambulatory ,steady gait, erythema on left breast , under breast  fold, kin has peeled, suggested neosporin ointment at that area, gave appt car for 1 month follow up, gave [facility] flyer to patient, but she lives in [place] ,she will think about it, c/o itching on top of left breast 11:33 AM

## 2011-10-07 NOTE — Progress Notes (Signed)
[facility] Weekly Treatment Note    Name: [name] Date: 10/07/2011 MRN: [id number] DOB: Nov 13, 1955  Status: outpatient    Current dose: 60.4 gray  Current fraction: 33  Planned dose: 60.4 gray  Planned fraction: 33   MEDICATIONS: Current Outpatient Prescriptions  Medication Sig Dispense Refill  . albuterol (PROVENTIL) (5 MG/ML) 0.5% nebulizer solution Take 2.5 mg by nebulization every 6 (six) hours as needed. For shortness of breath      . ALPRAZolam (XANAX) 0.5 MG tablet Take 0.5 mg by mouth daily as needed. For anxiety.      [name] aspirin 325 MG tablet Take 325 mg by mouth daily.        . B Complex-C (SUPER B COMPLEX PO) Take 1 tablet by mouth daily.       . Cholecalciferol (VITAMIN D-3 PO) Take 2,000 Units by mouth daily.      . clonazePAM (KLONOPIN) 0.5 MG tablet Take 1 mg by mouth at bedtime as needed. For sleep      . fish oil-omega-3 fatty acids 1000 MG capsule Take 2 g by mouth 2 (two) times daily.       [name] HYDROcodone-acetaminophen (NORCO) 5-325 MG per tablet       . lisinopril (PRINIVIL,ZESTRIL) 5 MG tablet Take 1 tablet (5 mg total) by mouth daily. PM  30 tablet  6  . metoprolol tartrate (LOPRESSOR) 25 MG tablet Take 1 tablet (25 mg total) by mouth 2 (two) times daily.  60 tablet  11  . PARoxetine (PAXIL) 20 MG tablet Take 60 mg by mouth daily. 3 tabs daily - PM      . pravastatin (PRAVACHOL) 40 MG tablet Take 40 mg by mouth daily. PM      . Wound Cleansers (RADIAPLEX EX) Apply topically.         ALLERGIES: Food   LABORATORY DATA:  Lab Results  Component Value Date   WBC 12.2* 08/11/2011   HGB 17.0* 08/11/2011   HCT 49.9* 08/11/2011   MCV 94.0 08/11/2011   PLT 170 08/11/2011   Lab Results  Component Value Date   NA 144 07/31/2011   K 4.3 07/31/2011   CL 108 07/31/2011   CO2 24 07/20/2011   Lab Results  Component Value Date   ALT 14 07/20/2011   AST 16 07/20/2011   ALKPHOS 67 07/20/2011   BILITOT 0.3 07/20/2011      NARRATIVE: [name] was seen today for weekly treatment management. The chart was checked and port films images were reviewed. The patient finished her final treatment today. She has done well overall. The patient has experienced some dry desquamation underneath the breast.  PHYSICAL EXAMINATION: weight is 96.208 kg (212 lb 1.6 oz).    diffuse erythema present. Dry desquamation present under the breast. No moist desquamation. Overall her skin looks good for her final day   ASSESSMENT: Completes treatment today. Followup in one month.

## 2011-11-03 NOTE — Progress Notes (Signed)
  Radiation Oncology         (336) [phone] ________________________________  Name: [name] MRN: [id number]  Date: 10/07/2011  DOB: 1956-01-23  End of Treatment Note  Diagnosis:   Left-sided breast cancer     Indication for treatment:  Curative       Radiation treatment dates:   08/24/2011 10/07/2011  Site/dose:   The patient was initially treated with a course of whole breast radiation to a dose of 50.4 gray at 1.8 gray per fraction. To accomplish this to tangent fields were used, both with 6 and 10 MV photons. The patient then received a three-field photon boost treatment to the seromatous cavity for an additional 10 gray at 2 gray per fraction. Each of these fields used 6 MV photons. The patient's final total dose was 60.4 gray.  Narrative: The patient tolerated radiation treatment relatively well.   The patient did have some skin irritation including some diffuse erythema and some dry desquamation.  Plan: The patient has completed radiation treatment. The patient will return to radiation oncology clinic for routine followup in one month. I advised the patient to call or return sooner if they have any questions or concerns related to their recovery or treatment. ________________________________  Jodelle Gross, M.D., Ph.D.

## 2011-11-03 NOTE — Progress Notes (Signed)
   Department of Radiation Oncology  Phone:  [phone] Fax:        [phone]  Weekly Treatment Note    Name: [name] Date: 11/03/2011 MRN: [id number] DOB: 08-29-1955   Current dose: 16.2 gray  Current fraction: 9   MEDICATIONS: Current Outpatient Prescriptions  Medication Sig Dispense Refill  . albuterol (PROVENTIL) (5 MG/ML) 0.5% nebulizer solution Take 2.5 mg by nebulization every 6 (six) hours as needed. For shortness of breath      . ALPRAZolam (XANAX) 0.5 MG tablet Take 0.5 mg by mouth daily as needed. For anxiety.      [name] aspirin 325 MG tablet Take 325 mg by mouth daily.        . B Complex-C (SUPER B COMPLEX PO) Take 1 tablet by mouth daily.       . Cholecalciferol (VITAMIN D-3 PO) Take 2,000 Units by mouth daily.      . clonazePAM (KLONOPIN) 0.5 MG tablet Take 1 mg by mouth at bedtime as needed. For sleep      . fish oil-omega-3 fatty acids 1000 MG capsule Take 2 g by mouth 2 (two) times daily.       [name] HYDROcodone-acetaminophen (NORCO) 5-325 MG per tablet       . lisinopril (PRINIVIL,ZESTRIL) 5 MG tablet Take 1 tablet (5 mg total) by mouth daily. PM  30 tablet  6  . metoprolol tartrate (LOPRESSOR) 25 MG tablet Take 1 tablet (25 mg total) by mouth 2 (two) times daily.  60 tablet  11  . PARoxetine (PAXIL) 20 MG tablet Take 60 mg by mouth daily. 3 tabs daily - PM      . pravastatin (PRAVACHOL) 40 MG tablet Take 40 mg by mouth daily. PM      . Wound Cleansers (RADIAPLEX EX) Apply topically.         ALLERGIES: Food   LABORATORY DATA:  Lab Results  Component Value Date   WBC 12.2* 08/11/2011   HGB 17.0* 08/11/2011   HCT 49.9* 08/11/2011   MCV 94.0 08/11/2011   PLT 170 08/11/2011   Lab Results  Component Value Date   NA 144 07/31/2011   K 4.3 07/31/2011   CL 108 07/31/2011   CO2 24 07/20/2011   Lab Results  Component Value Date   ALT 14 07/20/2011   AST 16 07/20/2011   ALKPHOS 67 07/20/2011   BILITOT 0.3 07/20/2011     NARRATIVE: [name] was  seen today for weekly treatment management. The chart was checked and the patient's films were reviewed. The patient is doing well. No major changes. She has noticed a little bit of swelling.  PHYSICAL EXAMINATION: vitals were not taken for this visit.     no significant changes in the breast area  ASSESSMENT: The patient is doing satisfactorily with treatment.  PLAN: We will continue with the patient's radiation treatment as planned.

## 2011-11-03 NOTE — Progress Notes (Signed)
Encounter addended by: Marye Round, MD on: 11/03/2011 11:16 AM<BR>     Documentation filed: Visit Diagnoses

## 2011-11-03 NOTE — Progress Notes (Signed)
Complex simulation note  The patient has undergone simulation for her upcoming boost treatment. She has initially been planned to receive 50.4 gray. She will now receive a 10 gray boost to the tumor cavity which has been contoured. To accomplish this 3 additional customized blocks have been designed for this purpose. A complex isodose plan is requested to ensure that the target area is adequately covered with radiation dose. The final total dose will be 60.4 gray.

## 2011-11-12 ENCOUNTER — Encounter: Payer: Self-pay | Admitting: Radiation Oncology

## 2011-11-13 ENCOUNTER — Encounter: Payer: Self-pay | Admitting: Radiation Oncology

## 2011-11-13 ENCOUNTER — Ambulatory Visit
Admission: RE | Admit: 2011-11-13 | Discharge: 2011-11-13 | Disposition: A | Discharge status: Home / Self Care | Payer: Medicare HMO | Source: Ambulatory Visit | Attending: Radiation Oncology | Admitting: Radiation Oncology

## 2011-11-13 VITALS — BP 123/78 | HR 130 | Temp 97.5°F | Resp 18 | Wt 210.1 lb

## 2011-11-13 DIAGNOSIS — C50219 Malignant neoplasm of upper-inner quadrant of unspecified female breast: Secondary | ICD-10-CM

## 2011-11-13 NOTE — Progress Notes (Signed)
Patient presents to the clinic today unaccompanied for a follow up appointment with Dr. Lisbeth Renshaw. Patient is alert and oriented to person, place, and time. No distress noted. Steady gait noted. Pleasant affect noted. Patient denies pain at this time. Patient reports an occasional brief sharp shooting pain at breast surgical site. Instructed patient this pain was normal and a result of tissue healing from surgery. Patient verbalized understanding. Patient reports occasional headaches and dizzy spells. Patient denies nausea, vomiting, or diarrhea. Patient reports her breast has healed well from radiation and the only thing she has note is slight tanning of treatment area. Patient reports using lotion on treatment area when she can remember. Patient complains of decreased energy despite 6-7 hours of broken sleep per night. 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 Patient scheduled to see Dr. Dalbert Batman 11/17/11. Patient reports that she did not cancel appt with Rubin's office and plans to call them reference follow up. Reported all findings to Dr. Lisbeth Renshaw.

## 2011-11-13 NOTE — Progress Notes (Signed)
Radiation Oncology         (336) [phone] ________________________________  Name: [name] MRN: [id number]  Date: 11/13/2011  DOB: September 08, 1955  Follow-Up Visit Note  CC: [name], OTR, OTR  Tracy Hector, MD  Diagnosis:   Left-sided breast cancer  Interval Since Last Radiation:  One month   Narrative:  The patient returns today for routine follow-up.  Patient states that she has done well. She has had some shooting pains in the breast area but these have been transient. She feels that the skin has healed very well since she finished treatment. She does complain of some significant fatigue. The patient has been given a prescription for anti-hormonal treatment through Dr. Derrill Reid. She states that she has not filled this as of yet. When asked about this she indicated that she really had lost track of this or had forgotten about this to some degree. She does appear however to have some questions regarding how well she will be able to tolerate this medicine.                              ALLERGIES:  is allergic to food.  Meds: Current Outpatient Prescriptions  Medication Sig Dispense Refill  . albuterol (PROVENTIL) (5 MG/ML) 0.5% nebulizer solution Take 2.5 mg by nebulization every 6 (six) hours as needed. For shortness of breath      . ALPRAZolam (XANAX) 0.5 MG tablet Take 0.5 mg by mouth daily as needed. For anxiety.      [name] aspirin 325 MG tablet Take 325 mg by mouth daily.        . B Complex-C (SUPER B COMPLEX PO) Take 1 tablet by mouth daily.       . Cholecalciferol (VITAMIN D-3 PO) Take 2,000 Units by mouth daily.      . clonazePAM (KLONOPIN) 0.5 MG tablet Take 1 mg by mouth at bedtime as needed. For sleep      . fish oil-omega-3 fatty acids 1000 MG capsule Take 2 g by mouth 2 (two) times daily.       [name] lisinopril (PRINIVIL,ZESTRIL) 5 MG tablet Take 1 tablet (5 mg total) by mouth daily. PM  30 tablet  6  . metoprolol tartrate (LOPRESSOR) 25 MG tablet Take 1 tablet (25  mg total) by mouth 2 (two) times daily.  60 tablet  11  . PARoxetine (PAXIL) 20 MG tablet Take 60 mg by mouth daily. 3 tabs daily - PM      . pravastatin (PRAVACHOL) 40 MG tablet Take 40 mg by mouth daily. PM      . HYDROcodone-acetaminophen (NORCO) 5-325 MG per tablet       . Wound Cleansers (RADIAPLEX EX) Apply topically.        Physical Findings: The patient is in no acute distress. Patient is alert and oriented.  weight is 210 lb 1.6 oz (95.301 kg). Her oral temperature is 97.5 F (36.4 C). Her blood pressure is 123/78 and her pulse is 130. Her respiration is 18. .   The patient's skin looks excellent. Some post radiation changes are present which are moderate. No ongoing desquamation.  Lab Findings: Lab Results  Component Value Date   WBC 12.2* 08/11/2011   HGB 17.0* 08/11/2011   HCT 49.9* 08/11/2011   MCV 94.0 08/11/2011   PLT 170 08/11/2011     Radiographic Findings: No results found.  Impression:    The patient is  doing very well since completing her radiation treatment. Her skin looks excellent.  Plan:  The patient will return to our clinic on a when necessary basis. I urged her to either begin her anti-hormonal treatment which has been scheduled for her or to set up an appointment with Dr. Derrill Reid sooner than has been scheduled as of now. She expressed an understanding of this and agreed to do this.   [name], M.D., Ph.D.

## 2011-11-17 ENCOUNTER — Ambulatory Visit (INDEPENDENT_AMBULATORY_CARE_PROVIDER_SITE_OTHER): Payer: Medicare HMO | Admitting: General Surgery

## 2011-11-17 ENCOUNTER — Encounter (INDEPENDENT_AMBULATORY_CARE_PROVIDER_SITE_OTHER): Payer: Self-pay | Admitting: General Surgery

## 2011-11-17 VITALS — BP 136/88 | HR 72 | Temp 97.3°F | Resp 18 | Ht 64.0 in | Wt 209.5 lb

## 2011-11-17 DIAGNOSIS — C50219 Malignant neoplasm of upper-inner quadrant of unspecified female breast: Secondary | ICD-10-CM

## 2011-11-17 NOTE — Progress Notes (Signed)
Subjective:     Patient ID: [name], female   DOB: February 08, 1956, 56 y.o.   MRN: [id number]  HPI This patient returns in followup of her left breast cancer.  On June 30, 2011 she underwent a left partial mastectomy in the medial aspect of the left breast. This was because of an abnormal mammogram. This showed invasive ductal carcinoma and she subsequently underwent sentinel lymph node biopsy on July 31, 2011. Final pathology report showed invasive ductal carcinoma, stage T1a., N0, receptor positive, Ki-67 9%.  She has healed her wounds. She has completed radiation therapy with Dr. Lisbeth Renshaw 5 weeks ago. She has been given a prescription for arimidex by Dr.  Truddie Coco but has been fearful of filling a prescription because she had a friend who had alot of "swelling".  I had a long talk about the use of anti-estrogen therapy. I told her that I supported its use in her case. She is going to see Dr. Truddie Coco in May and discuss this with him further.  She has  no complaints about her breasts. She has no complaints about  her arm, exhibiting no arm numbness or swelling.  Swelling she has full range of motion.  Review of Systems 10 system review of systems is performed and is negative except as described above.    Objective:   Physical Exam Patient looks well. She is in no distress.  Neck no adenopathy mass or jugular venous distention.  Lungs clear to auscultation, no chest wall tenderness  Breasts left breast incision and left axillary incision are well healed. Good cosmetic result. No hematoma or seroma.  Extr:  range of motion left shoulder is 100%. No swelling.    Assessment:     Invasive ductal carcinoma left breast, upper inner quadrant, stage TI A., N0, receptor positive, Ki67 9%.  Uneventful  recovery following left partial mastectomy, sentinel node biopsy, and adjuvant radiation therapy.    Plan:     I encouraged her to keep her appointment with Dr. Eston Esters and I further  discussed the advantages and side effects of anti-antigen therapy.  Mammograms should be performed in December 2013, and I told her this  Return to see me in January 2014.   Edsel Petrin. Dalbert Batman, M.D., [facility] General and Minimally invasive Surgery Breast and Colorectal Surgery Office:   [phone] Pager:   [phone]

## 2011-11-17 NOTE — Patient Instructions (Signed)
All of your surgical incisions have healed well without any complication. You seem to have recovered from radiation therapy well. There is no evidence of cancer on your physical exam.  I advise you to see Dr. Eston Esters next month and discuss the use of the anti-hormone medication with him.  Bilateral mammograms should be performed in December 2013.  Return to see Dr. Dalbert Batman in January 2014.

## 2011-12-15 ENCOUNTER — Ambulatory Visit: Payer: Medicare HMO | Admitting: Oncology

## 2011-12-16 ENCOUNTER — Other Ambulatory Visit (HOSPITAL_BASED_OUTPATIENT_CLINIC_OR_DEPARTMENT_OTHER): Payer: Medicare HMO | Admitting: Lab

## 2011-12-16 ENCOUNTER — Other Ambulatory Visit: Payer: Medicare HMO | Admitting: Lab

## 2011-12-16 DIAGNOSIS — C50919 Malignant neoplasm of unspecified site of unspecified female breast: Secondary | ICD-10-CM

## 2011-12-16 DIAGNOSIS — D751 Secondary polycythemia: Secondary | ICD-10-CM

## 2011-12-16 LAB — CBC WITH DIFFERENTIAL/PLATELET
BASO%: 0.5 % (ref 0.0–2.0)
EOS%: 6.4 % (ref 0.0–7.0)
HCT: 49.3 % — ABNORMAL HIGH (ref 34.8–46.6)
LYMPH%: 8 % — ABNORMAL LOW (ref 14.0–49.7)
MCH: 32.5 pg (ref 25.1–34.0)
MCHC: 34.1 g/dL (ref 31.5–36.0)
MCV: 95.4 fL (ref 79.5–101.0)
MONO%: 4.7 % (ref 0.0–14.0)
NEUT%: 80.4 % — ABNORMAL HIGH (ref 38.4–76.8)
Platelets: 175 10*3/uL (ref 145–400)
lymph#: 1 10*3/uL (ref 0.9–3.3)

## 2011-12-17 LAB — COMPREHENSIVE METABOLIC PANEL
AST: 15 U/L (ref 0–37)
Alkaline Phosphatase: 63 U/L (ref 39–117)
BUN: 13 mg/dL (ref 6–23)
Creatinine, Ser: 0.88 mg/dL (ref 0.50–1.10)
Glucose, Bld: 121 mg/dL — ABNORMAL HIGH (ref 70–99)
Total Bilirubin: 0.4 mg/dL (ref 0.3–1.2)

## 2012-03-26 ENCOUNTER — Other Ambulatory Visit: Payer: Self-pay | Admitting: Cardiovascular Disease

## 2012-04-26 ENCOUNTER — Other Ambulatory Visit: Payer: Self-pay | Admitting: Cardiovascular Disease

## 2012-04-26 NOTE — Telephone Encounter (Signed)
..   Requested Prescriptions   Pending Prescriptions Disp Refills  . lisinopril (PRINIVIL,ZESTRIL) 5 MG tablet [Pharmacy Med Name: LISINOPRIL 5MG       TAB] 30 tablet 1    Sig: TAKE ONE TABLET BY MOUTH IN THE EVENING. NEEDS  OFFICE  VISIT  FOR  ADDITIONAL  REFILLS

## 2012-05-26 ENCOUNTER — Encounter: Payer: Self-pay | Admitting: Physician Assistant

## 2012-05-26 ENCOUNTER — Ambulatory Visit (INDEPENDENT_AMBULATORY_CARE_PROVIDER_SITE_OTHER): Payer: Medicare HMO | Admitting: Physician Assistant

## 2012-05-26 VITALS — BP 116/82 | HR 63 | Ht 64.0 in | Wt 210.8 lb

## 2012-05-26 DIAGNOSIS — Z720 Tobacco use: Secondary | ICD-10-CM

## 2012-05-26 DIAGNOSIS — E785 Hyperlipidemia, unspecified: Secondary | ICD-10-CM

## 2012-05-26 DIAGNOSIS — F411 Generalized anxiety disorder: Secondary | ICD-10-CM

## 2012-05-26 DIAGNOSIS — I6529 Occlusion and stenosis of unspecified carotid artery: Secondary | ICD-10-CM

## 2012-05-26 DIAGNOSIS — F17200 Nicotine dependence, unspecified, uncomplicated: Secondary | ICD-10-CM

## 2012-05-26 DIAGNOSIS — I10 Essential (primary) hypertension: Secondary | ICD-10-CM

## 2012-05-26 DIAGNOSIS — F419 Anxiety disorder, unspecified: Secondary | ICD-10-CM

## 2012-05-26 DIAGNOSIS — I2510 Atherosclerotic heart disease of native coronary artery without angina pectoris: Secondary | ICD-10-CM

## 2012-05-26 MED ORDER — METOPROLOL TARTRATE 25 MG PO TABS: 25.0000 mg | ORAL_TABLET | Freq: Two times a day (BID) | ORAL | Status: DC

## 2012-05-26 MED ORDER — LISINOPRIL 5 MG PO TABS: 5.0000 mg | ORAL_TABLET | Freq: Every day | ORAL | Status: DC

## 2012-05-26 MED ORDER — ISOSORBIDE MONONITRATE ER 30 MG PO TB24: 15.0000 mg | ORAL_TABLET | Freq: Every day | ORAL | Status: DC

## 2012-05-26 NOTE — Progress Notes (Signed)
[address] [place], [place]  [address] Phone: [phone], Fax:  [phone]  Date:  05/26/2012   Name:  [name]   DOB:  [birth year]   MRN:  [id number]  PCP:  Eulis Foster, [place]  Primary Cardiologist:  [name]  Primary Electrophysiologist:  None    History of Present Illness: [name] is a 56 y.o. female who returns for follow up on CAD.  She has a h/o CAD, s/p NSTEMI in 4/08. She underwent PCI with BMS to the dRCA. She also had an occluded OM but attempted PCI was unsuccessful. Otherwise, she had non obstructive CAD and normal LVF. She had a pre-op stress test last year prior to lumpectomy that demonstrated inf-lat ischemia.  LHC 05/2011: dLAD 70%, OM1 70-80% then occluded, dOM filled by L->L collats, no change from 2008, mRCA stent occluded, dRCA filled L->R collats, EF 55-65%.  Med Rx recommended.  PCI could be considered of the RCA if she had refractory angina.  Last seen by [name] in 06/2011.  She has undergone partial left mastectomy.  Sentinel node bx which was negative.  She has receptor + invasive ductal CA.  She has been tx with radiation and Arimidex.    She notes mild exertional angina.  This is unchanged.  She notes assoc dyspnea.  Symptoms resolve quickly.  She does not take NTG.  She also has occasional left sided CP.  This is brief.  No orthopnea, PND, edema.  No syncope. Her main concern is anxiety. She believes that her anxiety is making it difficult for her to quit smoking. She sees a Teacher, music in [place].  Labs (5/13):   K 4.2, creatinine 0.8, ALT 15, Hgb 16.8  Wt Readings from Last 3 Encounters:  05/26/12 210 lb 12.8 oz (95.618 kg)  11/17/11 209 lb 8 oz (95.029 kg)  11/13/11 210 lb 1.6 oz (95.301 kg)     Past Medical History  Diagnosis Date  . Depression with anxiety   . Hyperlipidemia   . Stable angina     a. med Rx after cath 05/2011  . Chronic kidney disease   . CAD (coronary artery  disease)     NSTEMI 4/08: OM3 occluded (PCI unsuccessful), dRCA 95% => BMS, inf HK, EF 50%;   b. MV 11/12:  IL ischemia => c.  [place] 11/12:  dLAD 70%, OM1 70-80%, then occluded (no change from 2008), dOM filled L->L collats, mRCA stent occluded, dRCA filled L->R collats, EF 55-65% => med Rx (consider PCI of RCA if refractory angina)  . COPD (chronic obstructive pulmonary disease)   . Headache     sinus; occ. migraines  . GERD (gastroesophageal reflux disease)     OTC acid reducer prn  . Hypertension     under control; has been on med. x 4 yrs.  . Stress incontinence, female   . Breast cancer 06/30/11    inv ductal, ER/PR +, her-2 -  . Carotid stenosis     dopplers 5/08: 0-30% bilateral; 10/12: 0-39% B/L ICA => f/u 04/2013  . Hx of radiation therapy 08/24/11 to 10/07/11    L breast    Current Outpatient Prescriptions  Medication Sig Dispense Refill  . amoxicillin (AMOXIL) 500 MG tablet Take 500 mg by mouth every 6 (six) hours.      [name] aspirin 325 MG tablet Take 325 mg by mouth daily.        . Cholecalciferol (VITAMIN D-3 PO)  Take 2,000 Units by mouth daily.      . clonazePAM (KLONOPIN) 0.5 MG tablet Take 1 mg by mouth at bedtime as needed. For sleep      . fish oil-omega-3 fatty acids 1000 MG capsule Take 2 g by mouth 2 (two) times daily.       [name] lisinopril (PRINIVIL,ZESTRIL) 5 MG tablet TAKE ONE TABLET BY MOUTH IN THE EVENING. NEEDS  OFFICE  VISIT  FOR  ADDITIONAL  REFILLS  30 tablet  1  . metoprolol tartrate (LOPRESSOR) 25 MG tablet Take 1 tablet (25 mg total) by mouth 2 (two) times daily.  60 tablet  11  . PARoxetine (PAXIL) 20 MG tablet Take 40 mg by mouth daily.       . pravastatin (PRAVACHOL) 40 MG tablet Take 40 mg by mouth daily. PM      . Wound Cleansers (RADIAPLEX EX) Apply topically.        Allergies: Allergies  Allergen Reactions  . Food Other (See Comments)    Nutmeg= difficulty breathing    Social History:  The patient  reports that she has been smoking Cigarettes.  She  has a 35 pack-year smoking history. She has never used smokeless tobacco. She reports that she drinks alcohol. She reports that she does not use illicit drugs.   ROS:  Please see the history of present illness.   All other systems reviewed and negative.   PHYSICAL EXAM: VS:  BP 116/82  Pulse 63  Ht 5\' 4"  (1.626 m)  Wt 210 lb 12.8 oz (95.618 kg)  BMI 36.18 kg/m2 Well nourished, well developed, in no acute distress HEENT: normal Neck: no JVD Vascular: Positive for right carotid bruit Cardiac:  normal S1, S2; RRR; no murmur Lungs:  clear to auscultation bilaterally, no wheezing, rhonchi or rales Abd: soft, nontender, no hepatomegaly Ext: no edema Skin: warm and dry Neuro:  CNs 2-12 intact, no focal abnormalities noted  EKG:  NSR, HR 63, normal axis, nonspecific ST-T wave changes, no change from prior tracing      ASSESSMENT AND PLAN:  1. Coronary Artery Disease:   Patient has stable exertional angina. There has been no significant change since she was seen last December. We discussed a trial of long-acting nitrates. I did give her prescription and she will fill this if she decides to try it. Otherwise, she'll continue her current therapy. Followup with Dr. [name] or me in 6 months.  2. Hypertension:  Controlled.  Continue current therapy.   3. Hyperlipidemia:   Managed by PCP. Goal LDL less than or equal to 70.  4. Carotid Stenosis:   Carotid Dopplers due in 04/2013.  5. Anxiety:   I have asked her to discuss this further with her PCP.  6. Tobacco Abuse:   She understands that she needs to quit. We discussed different strategies for quitting.  7. Breast Cancer:   This is managed by oncology. She has completed radiation therapy.  Danton Sewer, PA-C  11:25 AM 05/26/2012

## 2012-05-26 NOTE — Patient Instructions (Addendum)
YOU HAVE BEEN GIVEN A PRESCRIPTION TO TAKE TO YOUR PRIMARY CARE PHYSICIAN TO HAVE LAB WORK DONE FOR A FASTING LIPID AND LIVER PANEL AND A BMET WITH RESULTS TO BE FAXED TO [facility], [place]  Your physician wants you to follow-up in: [place] DR. [name]. You will receive a reminder letter in the mail two months in advance. If you don't receive a letter, please call our office to schedule the follow-up appointment.   Your physician has recommended you make the following change in your medication: START IMDUR 30 MG TABLET YOU WILL TAKE ONLY A 1/2 TABLET DAILY, A PAPER  PRESCRIPTION WAS GIVEN TO YOU TODAY  REFILLS WERE SENT IN TODAY FOR LISINOPRIL AND METOPROLOL

## 2012-08-16 ENCOUNTER — Ambulatory Visit (INDEPENDENT_AMBULATORY_CARE_PROVIDER_SITE_OTHER): Payer: Medicare HMO | Admitting: General Surgery

## 2012-08-18 ENCOUNTER — Ambulatory Visit (INDEPENDENT_AMBULATORY_CARE_PROVIDER_SITE_OTHER): Payer: Medicare HMO | Admitting: General Surgery

## 2012-08-25 ENCOUNTER — Telehealth (INDEPENDENT_AMBULATORY_CARE_PROVIDER_SITE_OTHER): Payer: Self-pay | Admitting: General Surgery

## 2012-08-25 NOTE — Telephone Encounter (Signed)
Called and tried to reach patient to confirm she has had her mammogram to be reviewed for office visit tomorrow. Person on other end said they could not hear and asked the same question repeatedly. Will try again later.

## 2012-08-26 ENCOUNTER — Ambulatory Visit (INDEPENDENT_AMBULATORY_CARE_PROVIDER_SITE_OTHER): Payer: Medicare HMO | Admitting: General Surgery

## 2012-09-29 ENCOUNTER — Ambulatory Visit (INDEPENDENT_AMBULATORY_CARE_PROVIDER_SITE_OTHER): Payer: Medicare HMO | Admitting: General Surgery

## 2012-10-17 ENCOUNTER — Encounter: Payer: Self-pay | Admitting: Nurse Practitioner

## 2012-10-17 ENCOUNTER — Ambulatory Visit (INDEPENDENT_AMBULATORY_CARE_PROVIDER_SITE_OTHER): Payer: Medicare HMO | Admitting: Nurse Practitioner

## 2012-10-17 VITALS — BP 118/74 | HR 64 | Temp 97.2°F | Ht 63.0 in | Wt 212.0 lb

## 2012-10-17 DIAGNOSIS — G2581 Restless legs syndrome: Secondary | ICD-10-CM

## 2012-10-17 DIAGNOSIS — F329 Major depressive disorder, single episode, unspecified: Secondary | ICD-10-CM

## 2012-10-17 DIAGNOSIS — R739 Hyperglycemia, unspecified: Secondary | ICD-10-CM

## 2012-10-17 DIAGNOSIS — R7309 Other abnormal glucose: Secondary | ICD-10-CM

## 2012-10-17 DIAGNOSIS — F32A Depression, unspecified: Secondary | ICD-10-CM

## 2012-10-17 DIAGNOSIS — G4700 Insomnia, unspecified: Secondary | ICD-10-CM

## 2012-10-17 DIAGNOSIS — E785 Hyperlipidemia, unspecified: Secondary | ICD-10-CM

## 2012-10-17 DIAGNOSIS — I10 Essential (primary) hypertension: Secondary | ICD-10-CM

## 2012-10-17 LAB — COMPLETE METABOLIC PANEL WITH GFR
AST: 17 U/L (ref 0–37)
Albumin: 4.5 g/dL (ref 3.5–5.2)
BUN: 15 mg/dL (ref 6–23)
Calcium: 10 mg/dL (ref 8.4–10.5)
Chloride: 106 mEq/L (ref 96–112)
GFR, Est Non African American: 72 mL/min
Glucose, Bld: 133 mg/dL — ABNORMAL HIGH (ref 70–99)
Potassium: 4.4 mEq/L (ref 3.5–5.3)
Sodium: 142 mEq/L (ref 135–145)
Total Protein: 7 g/dL (ref 6.0–8.3)

## 2012-10-17 MED ORDER — ALPRAZOLAM 0.5 MG PO TABS: 0.5000 mg | ORAL_TABLET | Freq: Every evening | ORAL | Status: DC | PRN

## 2012-10-17 MED ORDER — PRAMIPEXOLE DIHYDROCHLORIDE 0.25 MG PO TABS: 0.2500 mg | ORAL_TABLET | Freq: Three times a day (TID) | ORAL | Status: DC

## 2012-10-17 MED ORDER — PRAVASTATIN SODIUM 40 MG PO TABS: 40.0000 mg | ORAL_TABLET | Freq: Every day | ORAL | Status: DC

## 2012-10-17 MED ORDER — PAROXETINE HCL 20 MG PO TABS: 40.0000 mg | ORAL_TABLET | Freq: Every day | ORAL | Status: DC

## 2012-10-17 NOTE — Patient Instructions (Addendum)
Smoking Cessation Quitting smoking is important to your health and has many advantages. However, it is not always easy to quit since nicotine is a very addictive drug. Often times, people try 3 times or more before being able to quit. This document explains the best ways for you to prepare to quit smoking. Quitting takes hard work and a lot of effort, but you can do it. ADVANTAGES OF QUITTING SMOKING  You will live longer, feel better, and live better.  Your body will feel the impact of quitting smoking almost immediately.  Within 20 minutes, blood pressure decreases. Your pulse returns to its normal level.  After 8 hours, carbon monoxide levels in the blood return to normal. Your oxygen level increases.  After 24 hours, the chance of having a heart attack starts to decrease. Your breath, hair, and body stop smelling like smoke.  After 48 hours, damaged nerve endings begin to recover. Your sense of taste and smell improve.  After 72 hours, the body is virtually free of nicotine. Your bronchial tubes relax and breathing becomes easier.  After 2 to 12 weeks, lungs can hold more air. Exercise becomes easier and circulation improves.  The risk of having a heart attack, stroke, cancer, or lung disease is greatly reduced.  After 1 year, the risk of coronary heart disease is cut in half.  After 5 years, the risk of stroke falls to the same as a nonsmoker.  After 10 years, the risk of lung cancer is cut in half and the risk of other cancers decreases significantly.  After 15 years, the risk of coronary heart disease drops, usually to the level of a nonsmoker.  If you are pregnant, quitting smoking will improve your chances of having a healthy baby.  The people you live with, especially any children, will be healthier.  You will have extra money to spend on things other than cigarettes. QUESTIONS TO THINK ABOUT BEFORE ATTEMPTING TO QUIT You may want to talk about your answers with your  caregiver.  Why do you want to quit?  If you tried to quit in the past, what helped and what did not?  What will be the most difficult situations for you after you quit? How will you plan to handle them?  Who can help you through the tough times? Your family? Friends? A caregiver?  What pleasures do you get from smoking? What ways can you still get pleasure if you quit? Here are some questions to ask your caregiver:  How can you help me to be successful at quitting?  What medicine do you think would be best for me and how should I take it?  What should I do if I need more help?  What is smoking withdrawal like? How can I get information on withdrawal? GET READY  Set a quit date.  Change your environment by getting rid of all cigarettes, ashtrays, matches, and lighters in your home, car, or work. Do not let people smoke in your home.  Review your past attempts to quit. Think about what worked and what did not. GET SUPPORT AND ENCOURAGEMENT You have a better chance of being successful if you have help. You can get support in many ways.  Tell your family, friends, and co-workers that you are going to quit and need their support. Ask them not to smoke around you.  Get individual, group, or telephone counseling and support. Programs are available at local hospitals and health centers. Call your local health department for   information about programs in your area.  Spiritual beliefs and practices may help some smokers quit.  Download a "quit meter" on your computer to keep track of quit statistics, such as how long you have gone without smoking, cigarettes not smoked, and money saved.  Get a self-help book about quitting smoking and staying off of tobacco. [place] yourself from urges to smoke. Talk to someone, go for a walk, or occupy your time with a task.  Change your normal routine. Take a different route to work. Drink tea instead of coffee.  Eat breakfast in a different place.  Reduce your stress. Take a hot bath, exercise, or read a book.  Plan something enjoyable to do every day. Reward yourself for not smoking.  Explore interactive web-based programs that specialize in helping you quit. GET MEDICINE AND USE IT CORRECTLY Medicines can help you stop smoking and decrease the urge to smoke. Combining medicine with the above behavioral methods and support can greatly increase your chances of successfully quitting smoking.  Nicotine replacement therapy helps deliver nicotine to your body without the negative effects and risks of smoking. Nicotine replacement therapy includes nicotine gum, lozenges, inhalers, nasal sprays, and skin patches. Some may be available over-the-counter and others require a prescription.  Antidepressant medicine helps people abstain from smoking, but how this works is unknown. This medicine is available by prescription.  Nicotinic receptor partial agonist medicine simulates the effect of nicotine in your brain. This medicine is available by prescription. Ask your caregiver for advice about which medicines to use and how to use them based on your health history. Your caregiver will tell you what side effects to look out for if you choose to be on a medicine or therapy. Carefully read the information on the package. Do not use any other product containing nicotine while using a nicotine replacement product.  RELAPSE OR DIFFICULT SITUATIONS Most relapses occur within the first 3 months after quitting. Do not be discouraged if you start smoking again. Remember, most people try several times before finally quitting. You may have symptoms of withdrawal because your body is used to nicotine. You may crave cigarettes, be irritable, feel very hungry, cough often, get headaches, or have difficulty concentrating. The withdrawal symptoms are only temporary. They are strongest when you first quit, but they will go away within  10 14 days. To reduce the chances of relapse, try to:  Avoid drinking alcohol. Drinking lowers your chances of successfully quitting.  Reduce the amount of caffeine you consume. Once you quit smoking, the amount of caffeine in your body increases and can give you symptoms, such as a rapid heartbeat, sweating, and anxiety.  Avoid smokers because they can make you want to smoke.  Do not let weight gain distract you. Many smokers will gain weight when they quit, usually less than 10 pounds. Eat a healthy diet and stay active. You can always lose the weight gained after you quit.  Find ways to improve your mood other than smoking. FOR MORE INFORMATION  www.smokefree.gov  Document Released: 07/07/2001 Document Revised: 01/12/2012 Document Reviewed: 10/22/2011 [facility] Patient Information 2013 [place], [place].  1. Essential hypertension, benign We may make changes to meds once labs are back. - COMPLETE METABOLIC PANEL WITH GFR  2. Other and unspecified hyperlipidem - NMR Lipoprofile with Lipids - pravastatin (PRAVACHOL) 40 MG tablet; Take 1 tablet (40 mg total) by mouth daily. PM  Dispense: 30 tablet; Refill: 5  3. DepressionPatient told  we will monitor her depression here so she will not have to go back to [facility] - PARoxetine (PAXIL) 20 MG tablet; Take 2 tablets (40 mg total) by mouth daily.  Dispense: 30 tablet; Refill: 5  4. Insomnia Bedtime ritual - ALPRAZolam (XANAX) 0.5 MG tablet; Take 1 tablet (0.5 mg total) by mouth at bedtime as needed for sleep.  Dispense: 30 tablet; Refill: 0  5. RLS (restless legs syndrome - pramipexole (MIRAPEX) 0.25 MG tablet; Take 1 tablet (0.25 mg total) by mouth 3 (three) times daily.  Dispense: 30 tablet; Refill: 5  Diet and exercise encouraged Patient will make appointment for Mammogram F/U in 3 months  [place], FNP

## 2012-10-17 NOTE — Progress Notes (Signed)
Subjective:    Patient ID: [name], female    DOB: Mar 06, 1956, 57 y.o.   MRN: [id number]  Hyperlipidemia This is a chronic problem. The current episode started more than 1 year ago. The problem is uncontrolled. Recent lipid tests were reviewed and are high. Exacerbating diseases include obesity. There are no known factors aggravating her hyperlipidemia. Pertinent negatives include no chest pain, myalgias or shortness of breath. Current antihyperlipidemic treatment includes statins and fibric acid derivatives. The current treatment provides no improvement of lipids. There are no compliance problems.  Risk factors for coronary artery disease include hypertension.  Hypertension This is a chronic problem. The current episode started more than 1 year ago. The problem is unchanged. The problem is controlled. Associated symptoms include anxiety. Pertinent negatives include no chest pain, palpitations or shortness of breath. There are no associated agents to hypertension. Risk factors for coronary artery disease include dyslipidemia, stress and obesity. Past treatments include ACE inhibitors and calcium channel blockers. The current treatment provides significant improvement. Compliance problems include diet and exercise.  Hypertensive end-organ damage includes CAD/MI (MI 2004 with stent palcement). There is no history of angina.   RLS- Started a few months ago. Says she constantly is having to move her legs. Waking her up at night. Insomnia-taking valeran root. Patient really doesn't want to take anything elseClonazepam makes her groggy the next day. Xanax helped her sleep well and caused no grogginess.  [name]  GAD/ depression- Sees physicain at [facility] she says is real ugly to her an dtell sher taht she is fat and need to get herself together. Currently on Paxil 40mg  which keeps her from crying all the time.   Review of Systems  Respiratory: Negative for shortness of breath.   Cardiovascular:  Negative for chest pain and palpitations.  Musculoskeletal: Negative for myalgias.  All other systems reviewed and are negative.       Objective:   Physical Exam  Constitutional: She is oriented to person, place, and time. She appears well-developed and well-nourished.  HENT:  Head: Normocephalic and atraumatic.  Right Ear: External ear normal.  Left Ear: External ear normal.  Mouth/Throat: Oropharynx is clear and moist.  Eyes: Pupils are equal, round, and reactive to light.  Neck: Normal range of motion. Neck supple.  Cardiovascular: Normal rate, normal heart sounds and intact distal pulses.   Pulmonary/Chest: Effort normal and breath sounds normal.  Abdominal: Soft. Bowel sounds are normal.  Neurological: She is alert and oriented to person, place, and time. She has normal reflexes.  Skin: Skin is warm and dry.  Psychiatric: She has a normal mood and affect. Her behavior is normal. Judgment and thought content normal.  BP 118/74  Pulse 64  Temp(Src) 97.2 F (36.2 C) (Oral)  Ht 5\' 3"  (1.6 m)  Wt 212 lb (96.163 kg)  BMI 37.56 kg/m2  LMP 10/17/2005         Assessment & Plan:  1. Essential hypertension, benign We may make changes to meds once labs are back. - COMPLETE METABOLIC PANEL WITH GFR  2. Other and unspecified hyperlipidem - NMR Lipoprofile with Lipids - pravastatin (PRAVACHOL) 40 MG tablet; Take 1 tablet (40 mg total) by mouth daily. PM  Dispense: 30 tablet; Refill: 5  3. DepressionPatient told we will monitor her depression here so she will not have to go back to [facility] - PARoxetine (PAXIL) 20 MG tablet; Take 2 tablets (40 mg total) by mouth daily.  Dispense: 30 tablet; Refill: 5  4.  Insomnia Bedtime ritual - ALPRAZolam (XANAX) 0.5 MG tablet; Take 1 tablet (0.5 mg total) by mouth at bedtime as needed for sleep.  Dispense: 30 tablet; Refill: 0  5. RLS (restless legs syndrome - pramipexole (MIRAPEX) 0.25 MG tablet; Take 1 tablet (0.25 mg total) by mouth 3  (three) times daily.  Dispense: 30 tablet; Refill: 5  Diet and exercise encouraged Patient will make appointment for Mammogram F/U in 3 months  [place], FNP

## 2012-10-20 ENCOUNTER — Telehealth: Payer: Self-pay | Admitting: *Deleted

## 2012-10-20 LAB — NMR LIPOPROFILE WITH LIPIDS
Cholesterol, Total: 239 mg/dL — ABNORMAL HIGH (ref <200)
LDL Particle Number: 2553 nmol/L — ABNORMAL HIGH (ref <1000)
Large HDL-P: <1.3 umol/L — ABNORMAL LOW (ref >=4.8)
Large VLDL-P: 3.3 nmol/L — ABNORMAL HIGH (ref <=2.7)
Small LDL Particle Number: 1680 nmol/L — ABNORMAL HIGH (ref <=527)
VLDL Size: 44.7 nm (ref >=46.6)

## 2012-10-20 LAB — POCT GLYCOSYLATED HEMOGLOBIN (HGB A1C): Hemoglobin A1C: 6

## 2012-10-20 NOTE — Addendum Note (Signed)
Addended by: Pollyann Kennedy F on: 10/20/2012 10:18 AM   Modules accepted: Orders

## 2012-10-20 NOTE — Addendum Note (Signed)
Addended by: [name] on: 10/20/2012 10:14 AM   Modules accepted: Orders

## 2012-10-24 ENCOUNTER — Other Ambulatory Visit: Payer: Self-pay | Admitting: Nurse Practitioner

## 2012-10-24 DIAGNOSIS — R739 Hyperglycemia, unspecified: Secondary | ICD-10-CM

## 2012-10-24 NOTE — Progress Notes (Signed)
No HgBA1c shuld not have been cancelled

## 2012-10-24 NOTE — Progress Notes (Signed)
Patient cant afford lipitor she will take something else she just cant pay for it can we do something other than that

## 2012-10-24 NOTE — Progress Notes (Signed)
She will take the lipitor. What dose do you prefer?

## 2012-10-25 ENCOUNTER — Other Ambulatory Visit: Payer: Self-pay | Admitting: Nurse Practitioner

## 2012-10-25 DIAGNOSIS — G2581 Restless legs syndrome: Secondary | ICD-10-CM

## 2012-10-25 DIAGNOSIS — E785 Hyperlipidemia, unspecified: Secondary | ICD-10-CM

## 2012-10-25 MED ORDER — ATORVASTATIN CALCIUM 40 MG PO TABS: 40.0000 mg | ORAL_TABLET | Freq: Every day | ORAL | Status: DC

## 2012-10-25 MED ORDER — PRAMIPEXOLE DIHYDROCHLORIDE 0.25 MG PO TABS: 0.2500 mg | ORAL_TABLET | Freq: Every day | ORAL | Status: DC

## 2012-10-25 NOTE — Progress Notes (Signed)
Patient aware that rx was sent to pharmacy.

## 2012-11-07 ENCOUNTER — Ambulatory Visit (INDEPENDENT_AMBULATORY_CARE_PROVIDER_SITE_OTHER): Payer: Medicare HMO | Admitting: General Surgery

## 2012-11-07 ENCOUNTER — Encounter (INDEPENDENT_AMBULATORY_CARE_PROVIDER_SITE_OTHER): Payer: Self-pay | Admitting: General Surgery

## 2012-11-07 VITALS — BP 120/82 | HR 72 | Temp 97.0°F | Resp 18 | Ht 63.5 in | Wt 213.4 lb

## 2012-11-07 DIAGNOSIS — C50119 Malignant neoplasm of central portion of unspecified female breast: Secondary | ICD-10-CM

## 2012-11-07 DIAGNOSIS — C50112 Malignant neoplasm of central portion of left female breast: Secondary | ICD-10-CM

## 2012-11-07 NOTE — Patient Instructions (Signed)
Examination of her breasts and lymph nodes today is normal. There is no evidence of cancer by physical exam.  You are due for mammograms and we will schedule that for you at the breast center of [facility].  Dr. Truddie Coco is no longer practicing in [place], and we will help you to  get reassigned to another medical oncologist.  If everything goes well, return to see Dr. Dalbert Batman in one year

## 2012-11-07 NOTE — Progress Notes (Signed)
Patient ID: [name], female   DOB: Mar 31, 1956, 57 y.o.   MRN: [id number] History: This patient returns in followup of her left breast cancer. On 06/30/2011 she underwent left partial mastectomy for a radiographic abnormality in the medial breast and was found to have invasive ductal carcinoma. Subsequently she had a sentinel lymph node biopsy which was negative. Pathologic staging T1a N0, receptor positive. She had radiation therapy. She discussed antiestrogen therapy but decided against that. She was followed by Dr. Truddie Coco, but has not yet been reassigned. She is overdue for mammograms but is ready to go ahead and have those done. She has no complaints about her breast. She has full range of motion of her arm and denies arm swelling.  ROS: 10 system review of systems is negative except as described above. She continues to smoke  Exam: Patient with well. No distress. Odor of tobacco Neck: No adenopathy no mass Lungs: Clear to auscultation bilaterally Heart: Regular rate and rhythm. No murmur. No ectopy Breasts: Left breast incision, transverse, upper inner quadrant and left axillary. Incision well-healed. Good cosmetic result. No palpable mass in either breast. No adenopathy.  Assessment: Invasive ductal carcinoma left breast, upper inner quadrant, stage T1a., N0, receptor positive, Ki-T7 9% No evidence of recurrence 1-1/2 years following left partial mastectomy, simple lavage and adjuvant radiation therapy  Plan: Will schedule for bilateral mammograms at [facility] now We have called the cancer center and they will reassign her to another medical oncologist Return to see me in one year after her annual mammograms.   Edsel Petrin. Dalbert Batman, M.D., [facility] General and Minimally invasive Surgery Breast and Colorectal Surgery Office:   [phone] Pager:   [phone]

## 2012-11-09 ENCOUNTER — Telehealth: Payer: Self-pay | Admitting: *Deleted

## 2012-11-09 NOTE — Telephone Encounter (Signed)
Called pt to get her an appt w/ a new provider and I was not able to leave message due to her voicemail being full.  Will try again later.

## 2012-11-14 ENCOUNTER — Telehealth: Payer: Self-pay | Admitting: *Deleted

## 2012-11-14 NOTE — Telephone Encounter (Signed)
Called pt to get her re-established w/ another provider and could not leave a message.  Will try again.

## 2012-11-22 ENCOUNTER — Other Ambulatory Visit: Payer: Self-pay | Admitting: Nurse Practitioner

## 2012-11-22 NOTE — Telephone Encounter (Signed)
[facility] called med in- pt aware

## 2012-11-22 NOTE — Telephone Encounter (Signed)
Last seen 10/17/12, last filled 10/17/12

## 2012-11-22 NOTE — Telephone Encounter (Signed)
Please call in RX for Xanax 0.5 1 PO qhs #[phone] 0 refills

## 2012-11-24 NOTE — Telephone Encounter (Signed)
error 

## 2012-12-21 ENCOUNTER — Ambulatory Visit
Admission: RE | Admit: 2012-12-21 | Discharge: 2012-12-21 | Disposition: A | Discharge status: Home / Self Care | Payer: Medicare HMO | Source: Ambulatory Visit | Attending: General Surgery | Admitting: General Surgery

## 2012-12-21 DIAGNOSIS — C50112 Malignant neoplasm of central portion of left female breast: Secondary | ICD-10-CM

## 2012-12-24 ENCOUNTER — Telehealth: Payer: Self-pay | Admitting: *Deleted

## 2012-12-24 ENCOUNTER — Other Ambulatory Visit: Payer: Self-pay | Admitting: Nurse Practitioner

## 2012-12-24 ENCOUNTER — Encounter: Payer: Self-pay | Admitting: *Deleted

## 2012-12-24 NOTE — Telephone Encounter (Signed)
Tried to call pt again and could not leave a message.  Mailed letter to have her to contact the office.

## 2012-12-26 NOTE — Telephone Encounter (Signed)
Last seen 03/14, last filled 11/21/12. Have nurse call into [facility]

## 2012-12-26 NOTE — Telephone Encounter (Signed)
Call in rx for xanax

## 2012-12-27 NOTE — Telephone Encounter (Signed)
Called in.

## 2013-01-10 ENCOUNTER — Telehealth: Payer: Self-pay | Admitting: *Deleted

## 2013-01-10 NOTE — Telephone Encounter (Signed)
Called and was able to leave message for pt to return my call so I can get her scheduled w/ a new provider since Dr. Truddie Coco is no longer here.

## 2013-01-18 ENCOUNTER — Ambulatory Visit (INDEPENDENT_AMBULATORY_CARE_PROVIDER_SITE_OTHER): Payer: Medicare HMO | Admitting: Nurse Practitioner

## 2013-01-18 ENCOUNTER — Encounter: Payer: Self-pay | Admitting: Nurse Practitioner

## 2013-01-18 VITALS — BP 124/74 | HR 56 | Temp 99.0°F | Ht 63.5 in | Wt 217.0 lb

## 2013-01-18 DIAGNOSIS — R7989 Other specified abnormal findings of blood chemistry: Secondary | ICD-10-CM

## 2013-01-18 DIAGNOSIS — F32A Depression, unspecified: Secondary | ICD-10-CM

## 2013-01-18 DIAGNOSIS — E785 Hyperlipidemia, unspecified: Secondary | ICD-10-CM

## 2013-01-18 DIAGNOSIS — F411 Generalized anxiety disorder: Secondary | ICD-10-CM

## 2013-01-18 DIAGNOSIS — F329 Major depressive disorder, single episode, unspecified: Secondary | ICD-10-CM

## 2013-01-18 DIAGNOSIS — R7309 Other abnormal glucose: Secondary | ICD-10-CM

## 2013-01-18 DIAGNOSIS — I2510 Atherosclerotic heart disease of native coronary artery without angina pectoris: Secondary | ICD-10-CM

## 2013-01-18 DIAGNOSIS — I10 Essential (primary) hypertension: Secondary | ICD-10-CM

## 2013-01-18 DIAGNOSIS — F3289 Other specified depressive episodes: Secondary | ICD-10-CM

## 2013-01-18 DIAGNOSIS — G2581 Restless legs syndrome: Secondary | ICD-10-CM

## 2013-01-18 LAB — COMPLETE METABOLIC PANEL WITH GFR
Alkaline Phosphatase: 77 U/L (ref 39–117)
BUN: 10 mg/dL (ref 6–23)
Creat: 0.93 mg/dL (ref 0.50–1.10)
GFR, Est Non African American: 69 mL/min
Glucose, Bld: 125 mg/dL — ABNORMAL HIGH (ref 70–99)
Total Bilirubin: 0.4 mg/dL (ref 0.3–1.2)

## 2013-01-18 MED ORDER — PAROXETINE HCL 20 MG PO TABS: 40.0000 mg | ORAL_TABLET | Freq: Every day | ORAL | Status: DC

## 2013-01-18 MED ORDER — LISINOPRIL 5 MG PO TABS: 5.0000 mg | ORAL_TABLET | Freq: Every day | ORAL | Status: DC

## 2013-01-18 MED ORDER — PRAMIPEXOLE DIHYDROCHLORIDE 0.5 MG PO TABS: 0.5000 mg | ORAL_TABLET | Freq: Two times a day (BID) | ORAL | Status: DC

## 2013-01-18 MED ORDER — ATORVASTATIN CALCIUM 40 MG PO TABS: 40.0000 mg | ORAL_TABLET | Freq: Every day | ORAL | Status: DC

## 2013-01-18 MED ORDER — ISOSORBIDE MONONITRATE ER 30 MG PO TB24: 15.0000 mg | ORAL_TABLET | Freq: Every day | ORAL | Status: DC

## 2013-01-18 MED ORDER — METOPROLOL TARTRATE 25 MG PO TABS: 25.0000 mg | ORAL_TABLET | Freq: Two times a day (BID) | ORAL | Status: DC

## 2013-01-18 MED ORDER — ALPRAZOLAM 0.5 MG PO TABS: 0.5000 mg | ORAL_TABLET | Freq: Two times a day (BID) | ORAL | Status: DC

## 2013-01-18 NOTE — Progress Notes (Signed)
Subjective:    Patient ID: [name], female    DOB: [birth year], 57 y.o.   MRN: [id number]  Rash This is a new problem. The current episode started yesterday. The problem has been gradually worsening since onset. The rash is diffuse. The rash is characterized by redness and itchiness. She was exposed to plant contact. Pertinent negatives include no shortness of breath. Past treatments include nothing. The treatment provided no relief.  Hyperlipidemia This is a chronic problem. The current episode started more than 1 year ago. The problem is uncontrolled. Recent lipid tests were reviewed and are high. Exacerbating diseases include obesity. She has no history of diabetes, hypothyroidism or liver disease. Factors aggravating her hyperlipidemia include smoking. Pertinent negatives include no chest pain, focal sensory loss, leg pain, myalgias or shortness of breath. Current antihyperlipidemic treatment includes statins. The current treatment provides moderate improvement of lipids. Compliance problems include adherence to diet and adherence to exercise.  Risk factors for coronary artery disease include hypertension, obesity and post-menopausal.  Hypertension This is a chronic problem. The current episode started more than 1 year ago. The problem has been resolved since onset. The problem is controlled. Pertinent negatives include no blurred vision, chest pain, headaches, neck pain, palpitations, peripheral edema or shortness of breath. There are no associated agents to hypertension. Risk factors for coronary artery disease include dyslipidemia, obesity and post-menopausal state. Past treatments include ACE inhibitors and beta blockers. The current treatment provides significant improvement. Compliance problems include diet and exercise.  Hypertensive end-organ damage includes CAD/MI (Mi in 2003).  GAD Takes xanax daily- tries not to take but can't usually sleep without taking Depression PAxil keeps her  from feeling sad all the time- No C/O side effects RLS Currently on mirapex 0.25 qd- Works well but lately has been occurring during the dya- meds don't seem to be helping as much.  Review of Systems  HENT: Negative for neck pain.   Eyes: Negative for blurred vision.  Respiratory: Negative for shortness of breath.   Cardiovascular: Negative for chest pain and palpitations.  Musculoskeletal: Negative for myalgias.  Skin: Positive for rash.  Neurological: Negative for headaches.  All other systems reviewed and are negative.       Objective:   Physical Exam  Constitutional: She is oriented to person, place, and time. She appears well-developed and well-nourished.  HENT:  Nose: Nose normal.  Mouth/Throat: Oropharynx is clear and moist.  Eyes: EOM are normal.  Neck: Trachea normal, normal range of motion and full passive range of motion without pain. Neck supple. No JVD present. Carotid bruit is not present. No thyromegaly present.  Cardiovascular: Normal rate, regular rhythm, normal heart sounds and intact distal pulses.  Exam reveals no gallop and no friction rub.   No murmur heard. Pulmonary/Chest: Effort normal and breath sounds normal.  Abdominal: Soft. Bowel sounds are normal. She exhibits no distension and no mass. There is no tenderness.  Musculoskeletal: Normal range of motion.  Lymphadenopathy:    She has no cervical adenopathy.  Neurological: She is alert and oriented to person, place, and time. She has normal reflexes.  Skin: Skin is warm and dry.  Small erythematous spot with central opening scattered all over body.  Toe nails thick and deformed bil feet  Psychiatric: She has a normal mood and affect. Her behavior is normal. Judgment and thought content normal.    BP 124/74  Pulse 56  Temp(Src) 99 F (37.2 C) (Oral)  Ht 5' 3.5" (1.613 m)  Wt 217 lb (98.431 kg)  BMI 37.83 kg/m2  LMP 10/17/2005       Assessment & Plan:  1. HTN (hypertension) lOW na+  DIET  - COMPLETE METABOLIC PANEL WITH GFR - lisinopril (PRINIVIL,ZESTRIL) 5 MG tablet; Take 1 tablet (5 mg total) by mouth daily.  Dispense: 90 tablet; Refill: 1 - metoprolol tartrate (LOPRESSOR) 25 MG tablet; Take 1 tablet (25 mg total) by mouth 2 (two) times daily.  Dispense: 180 tablet; Refill: 1  2. Other and unspecified hyperlipidemia lOW FAT DIET AN DEXERCISE ENCOURAGED - NMR Lipoprofile with Lipids - atorvastatin (LIPITOR) 40 MG tablet; Take 1 tablet (40 mg total) by mouth daily.  Dispense: 90 tablet; Refill: 1  3. Coronary atherosclerosis of native coronary artery kEEP FOLLOW UP WITH CARDIOLOGIST - isosorbide mononitrate (IMDUR) 30 MG 24 hr tablet; Take 0.5 tablets (15 mg total) by mouth daily.  Dispense: 90 tablet; Refill: 1  5. Depression Stress management - PARoxetine (PAXIL) 20 MG tablet; Take 2 tablets (40 mg total) by mouth daily.  Dispense: 90 tablet; Refill: 1  6. GAD (generalized anxiety disorder)  - ALPRAZolam (XANAX) 0.5 MG tablet; Take 1 tablet (0.5 mg total) by mouth 2 (two) times daily.  Dispense: 60 tablet; Refill: 1  7. RLS (restless legs syndrome) Changed dose of mirapex and also increased to BID - pramipexole (MIRAPEX) 0.5 MG tablet; Take 1 tablet (0.5 mg total) by mouth 2 (two) times daily.  Dispense: 180 tablet; Refill: 1  Follow-up in 3 months  [name], FNP

## 2013-01-18 NOTE — Patient Instructions (Signed)
Restless Legs Syndrome Restless legs syndrome is a movement disorder. It may also be called a sensori-motor disorder.  CAUSES  No one knows what specifically causes restless legs syndrome, but it tends to run in families. It is also more common in people with low iron, in pregnancy, in people who need dialysis, and those with nerve damage (neuropathy).Some medications may make restless legs syndrome worse.Those medications include drugs to treat high blood pressure, some heart conditions, nausea, colds, allergies, and depression. SYMPTOMS Symptoms include uncomfortable sensations in the legs. These leg sensations are worse during periods of inactivity or rest. They are also worse while sitting or lying down. Individuals that have the disorder describe sensations in the legs that feel like:  Pulling.  Drawing.  Crawling.  Worming.  Boring.  Tingling.  Pins and needles.  Prickling.  Pain. The sensations are usually accompanied by an overwhelming urge to move the legs. Sudden muscle jerks may also occur. Movement provides temporary relief from the discomfort. In rare cases, the arms may also be affected. Symptoms may interfere with going to sleep (sleep onset insomnia). Restless legs syndrome may also be related to periodic limb movement disorder (PLMD). PLMD is another more common motor disorder. It also causes interrupted sleep. The symptoms from PLMD usually occur most often when you are awake. TREATMENT  Treatment for restless legs syndrome is symptomatic. This means that the symptoms are treated.   Massage and cold compresses may provide temporary relief.  Walk, stretch, or take a cold or hot bath.  Get regular exercise and a good night's sleep.  Avoid caffeine, alcohol, nicotine, and medications that can make it worse.  Do activities that provide mental stimulation like discussions, needlework, and video games. These may be helpful if you are not able to walk or  stretch. Some medications are effective in relieving the symptoms. However, many of these medications have side effects. Ask your caregiver about medications that may help your symptoms. Correcting iron deficiency may improve symptoms for some patients. Document Released: 07/03/2002 Document Revised: 10/05/2011 Document Reviewed: 10/09/2010 [employer] Patient Information 2014 [employer].  

## 2013-01-19 ENCOUNTER — Other Ambulatory Visit: Payer: Self-pay | Admitting: Nurse Practitioner

## 2013-01-19 LAB — NMR LIPOPROFILE WITH LIPIDS
HDL Size: 8.6 nm — ABNORMAL LOW (ref >=9.2)
HDL-C: 34 mg/dL — ABNORMAL LOW (ref >=40)
LDL Particle Number: 1529 nmol/L — ABNORMAL HIGH (ref <1000)
LDL Size: 20.4 nm — ABNORMAL LOW (ref >20.5)
Large VLDL-P: 4.8 nmol/L — ABNORMAL HIGH (ref <=2.7)
Triglycerides: 230 mg/dL — ABNORMAL HIGH (ref <150)
VLDL Size: 47.6 nm — ABNORMAL HIGH (ref <=46.6)

## 2013-01-19 MED ORDER — EZETIMIBE 10 MG PO TABS: 10.0000 mg | ORAL_TABLET | Freq: Every day | ORAL | Status: DC

## 2013-01-20 LAB — POCT GLYCOSYLATED HEMOGLOBIN (HGB A1C): Hemoglobin A1C: 6.3

## 2013-01-20 NOTE — Addendum Note (Signed)
Addended by: Lyn Henri C on: 01/20/2013 10:42 AM   Modules accepted: Orders

## 2013-07-24 ENCOUNTER — Other Ambulatory Visit: Payer: Self-pay

## 2013-07-24 DIAGNOSIS — I10 Essential (primary) hypertension: Secondary | ICD-10-CM

## 2013-07-24 NOTE — Telephone Encounter (Signed)
Last seen 01/18/13   MMM

## 2013-07-25 MED ORDER — LISINOPRIL 5 MG PO TABS: 5.0000 mg | ORAL_TABLET | Freq: Every day | ORAL | Status: DC

## 2013-08-21 ENCOUNTER — Other Ambulatory Visit: Payer: Self-pay | Admitting: Nurse Practitioner

## 2013-08-23 NOTE — Telephone Encounter (Signed)
Please fill xanax

## 2013-08-23 NOTE — Telephone Encounter (Signed)
Last seen and filled 01/18/13

## 2013-08-24 NOTE — Telephone Encounter (Signed)
Called to [facility] by [employer]

## 2013-09-14 ENCOUNTER — Other Ambulatory Visit: Payer: Self-pay

## 2013-09-14 DIAGNOSIS — I10 Essential (primary) hypertension: Secondary | ICD-10-CM

## 2013-09-14 MED ORDER — METOPROLOL TARTRATE 25 MG PO TABS: 25.0000 mg | ORAL_TABLET | Freq: Two times a day (BID) | ORAL | Status: DC

## 2013-09-14 NOTE — Telephone Encounter (Signed)
ntbs

## 2013-09-14 NOTE — Telephone Encounter (Signed)
Last seen 01/18/13  MMM requesting 90 day supply

## 2013-09-15 ENCOUNTER — Telehealth: Payer: Self-pay | Admitting: Nurse Practitioner

## 2013-09-25 ENCOUNTER — Telehealth: Payer: Self-pay | Admitting: Nurse Practitioner

## 2013-09-25 DIAGNOSIS — E785 Hyperlipidemia, unspecified: Secondary | ICD-10-CM

## 2013-09-25 MED ORDER — ATORVASTATIN CALCIUM 40 MG PO TABS: 40.0000 mg | ORAL_TABLET | Freq: Every day | ORAL | Status: DC

## 2013-09-25 NOTE — Telephone Encounter (Signed)
rx sent to pharmacy

## 2013-09-26 ENCOUNTER — Other Ambulatory Visit: Payer: Self-pay

## 2013-09-26 DIAGNOSIS — E785 Hyperlipidemia, unspecified: Secondary | ICD-10-CM

## 2013-09-26 MED ORDER — ATORVASTATIN CALCIUM 40 MG PO TABS: 40.0000 mg | ORAL_TABLET | Freq: Every day | ORAL | Status: DC

## 2013-09-26 NOTE — Telephone Encounter (Signed)
Last seen 01/18/13 MMM  Last lipids 01/18/13

## 2013-09-26 NOTE — Telephone Encounter (Signed)
Patient NTBS for follow up and lab work  

## 2013-10-23 ENCOUNTER — Other Ambulatory Visit: Payer: Self-pay | Admitting: Family Medicine

## 2013-10-24 NOTE — Telephone Encounter (Signed)
Last ov 6/14. ntbs 

## 2013-10-24 NOTE — Telephone Encounter (Signed)
No more refills without being seen 

## 2013-10-27 ENCOUNTER — Other Ambulatory Visit: Payer: Self-pay | Admitting: Family Medicine

## 2013-10-31 ENCOUNTER — Encounter (INDEPENDENT_AMBULATORY_CARE_PROVIDER_SITE_OTHER): Payer: Self-pay | Admitting: General Surgery

## 2013-11-03 ENCOUNTER — Telehealth: Payer: Self-pay | Admitting: Nurse Practitioner

## 2013-11-03 NOTE — Telephone Encounter (Signed)
Patient complains that she woke up with sore/scratchy throat and eyes burning. The sore throat has improved. She does experience seasonal allergies and hasn't taken anything OTC. Suggested Claritin or Zyrtec, warm salt water gargles, and an antihistamine eye drop. She will f/u if symptoms worsen or persist.  Patient stated understanding and agreement to plan.

## 2014-02-14 ENCOUNTER — Ambulatory Visit (INDEPENDENT_AMBULATORY_CARE_PROVIDER_SITE_OTHER): Payer: Medicare HMO | Admitting: Family

## 2014-02-14 ENCOUNTER — Encounter: Payer: Self-pay | Admitting: Family

## 2014-02-14 VITALS — BP 130/71 | HR 87 | Temp 96.8°F | Ht 63.5 in | Wt 228.6 lb

## 2014-02-14 DIAGNOSIS — G2581 Restless legs syndrome: Secondary | ICD-10-CM

## 2014-02-14 DIAGNOSIS — F329 Major depressive disorder, single episode, unspecified: Secondary | ICD-10-CM

## 2014-02-14 DIAGNOSIS — I10 Essential (primary) hypertension: Secondary | ICD-10-CM

## 2014-02-14 DIAGNOSIS — J449 Chronic obstructive pulmonary disease, unspecified: Secondary | ICD-10-CM

## 2014-02-14 DIAGNOSIS — F3289 Other specified depressive episodes: Secondary | ICD-10-CM

## 2014-02-14 DIAGNOSIS — F32A Depression, unspecified: Secondary | ICD-10-CM

## 2014-02-14 DIAGNOSIS — R600 Localized edema: Secondary | ICD-10-CM

## 2014-02-14 DIAGNOSIS — E785 Hyperlipidemia, unspecified: Secondary | ICD-10-CM

## 2014-02-14 DIAGNOSIS — I2583 Coronary atherosclerosis due to lipid rich plaque: Secondary | ICD-10-CM

## 2014-02-14 DIAGNOSIS — F17200 Nicotine dependence, unspecified, uncomplicated: Secondary | ICD-10-CM

## 2014-02-14 DIAGNOSIS — F411 Generalized anxiety disorder: Secondary | ICD-10-CM | POA: Insufficient documentation

## 2014-02-14 DIAGNOSIS — R609 Edema, unspecified: Secondary | ICD-10-CM

## 2014-02-14 DIAGNOSIS — I2510 Atherosclerotic heart disease of native coronary artery without angina pectoris: Secondary | ICD-10-CM

## 2014-02-14 DIAGNOSIS — Z1321 Encounter for screening for nutritional disorder: Secondary | ICD-10-CM

## 2014-02-14 DIAGNOSIS — Z720 Tobacco use: Secondary | ICD-10-CM

## 2014-02-14 MED ORDER — METOPROLOL TARTRATE 25 MG PO TABS: 25.0000 mg | ORAL_TABLET | Freq: Two times a day (BID) | ORAL | Status: DC

## 2014-02-14 MED ORDER — ATORVASTATIN CALCIUM 40 MG PO TABS: 40.0000 mg | ORAL_TABLET | Freq: Every day | ORAL | Status: DC

## 2014-02-14 MED ORDER — ALPRAZOLAM 0.5 MG PO TABS: ORAL_TABLET | ORAL | Status: DC

## 2014-02-14 MED ORDER — ESCITALOPRAM OXALATE 10 MG PO TABS: 10.0000 mg | ORAL_TABLET | Freq: Every day | ORAL | Status: DC

## 2014-02-14 MED ORDER — LISINOPRIL 5 MG PO TABS: ORAL_TABLET | ORAL | Status: DC

## 2014-02-14 MED ORDER — EZETIMIBE 10 MG PO TABS: 10.0000 mg | ORAL_TABLET | Freq: Every day | ORAL | Status: DC

## 2014-02-14 MED ORDER — ISOSORBIDE MONONITRATE ER 30 MG PO TB24: 15.0000 mg | ORAL_TABLET | Freq: Every day | ORAL | Status: DC

## 2014-02-14 MED ORDER — FUROSEMIDE 20 MG PO TABS: 20.0000 mg | ORAL_TABLET | Freq: Every day | ORAL | Status: DC

## 2014-02-14 MED ORDER — PRAMIPEXOLE DIHYDROCHLORIDE 0.5 MG PO TABS: 0.5000 mg | ORAL_TABLET | Freq: Two times a day (BID) | ORAL | Status: DC

## 2014-02-14 NOTE — Progress Notes (Signed)
Subjective:    Patient ID: [name], female    DOB: [birth year], 57 y.o.   MRN: [id number]  Hyperlipidemia This is a chronic problem. The current episode started more than 1 year ago. The problem is uncontrolled. Recent lipid tests were reviewed and are high. Exacerbating diseases include obesity. Factors aggravating her hyperlipidemia include smoking. Associated symptoms include shortness of breath. Pertinent negatives include no chest pain or myalgias. Current antihyperlipidemic treatment includes statins and ezetimibe. The current treatment provides mild improvement of lipids. There are no compliance problems.  Risk factors for coronary artery disease include hypertension, dyslipidemia, family history, obesity and post-menopausal.  Hypertension This is a chronic problem. The current episode started more than 1 year ago. The problem is unchanged. The problem is controlled. Associated symptoms include anxiety, palpitations, peripheral edema and shortness of breath. Pertinent negatives include no chest pain or headaches. There are no associated agents to hypertension. Risk factors for coronary artery disease include dyslipidemia, stress, obesity, family history and post-menopausal state. Past treatments include ACE inhibitors and beta blockers. The current treatment provides significant improvement. Compliance problems include diet and exercise.  Hypertensive end-organ damage includes CAD/MI (MI 2004 with stent palcement). There is no history of angina, kidney disease, heart failure or a thyroid problem.  Anxiety Presents for follow-up visit. The problem has been waxing and waning. Symptoms include depressed mood, excessive worry, irritability, nervous/anxious behavior, palpitations and shortness of breath. Patient reports no chest pain, decreased concentration or panic. Symptoms occur occasionally. The severity of symptoms is moderate. The symptoms are aggravated by family issues.   Her past  medical history is significant for anxiety/panic attacks and depression. Past treatments include benzodiazephines. The treatment provided mild relief.      Review of Systems  Constitutional: Positive for irritability.  HENT: Negative.   Eyes: Negative.   Respiratory: Positive for shortness of breath.   Cardiovascular: Positive for palpitations. Negative for chest pain.  Gastrointestinal: Negative.   Endocrine: Negative.   Genitourinary: Negative.   Musculoskeletal: Negative.  Negative for myalgias.  Neurological: Negative.  Negative for headaches.  Hematological: Negative.   Psychiatric/Behavioral: Negative for decreased concentration. The patient is nervous/anxious.   All other systems reviewed and are negative.      Objective:   Physical Exam  Vitals reviewed. Constitutional: She is oriented to person, place, and time. She appears well-developed and well-nourished. No distress.  HENT:  Head: Normocephalic and atraumatic.  Right Ear: External ear normal.  Mouth/Throat: Oropharynx is clear and moist.  Eyes: Pupils are equal, round, and reactive to light.  Neck: Normal range of motion. Neck supple. No thyromegaly present.  Cardiovascular: Normal rate, regular rhythm, normal heart sounds and intact distal pulses.   No murmur heard. Pulmonary/Chest: Effort normal and breath sounds normal. No respiratory distress. She has no wheezes.  Abdominal: Soft. Bowel sounds are normal. She exhibits no distension. There is no tenderness.  Musculoskeletal: Normal range of motion. She exhibits edema (Mild edema in bilateral ankles). She exhibits no tenderness.  Neurological: She is alert and oriented to person, place, and time. She has normal reflexes. No cranial nerve deficit.  Skin: Skin is warm and dry.  Psychiatric: She has a normal mood and affect. Her behavior is normal. Judgment and thought content normal.      BP 130/71  Pulse 87  Temp(Src) 96.8 F (36 C) (Oral)  Ht 5' 3.5"  (1.613 m)  Wt 228 lb 9.6 oz (103.692 kg)  BMI 39.85 kg/m2  LMP 10/17/2005  Assessment & Plan:  1. Essential hypertension - CMP14+EGFR - metoprolol tartrate (LOPRESSOR) 25 MG tablet; Take 1 tablet (25 mg total) by mouth 2 (two) times daily.  Dispense: 180 tablet; Refill: 1 - lisinopril (PRINIVIL,ZESTRIL) 5 MG tablet; TAKE ONE TABLET BY MOUTH ONCE DAILY  Dispense: 90 tablet; Refill: 1  2. Chronic obstructive pulmonary disease, unspecified COPD, unspecified chronic bronchitis type  3. Hyperlipidemia - Lipid panel - atorvastatin (LIPITOR) 40 MG tablet; Take 1 tablet (40 mg total) by mouth daily.  Dispense: 90 tablet; Refill: 1 - ezetimibe (ZETIA) 10 MG tablet; Take 1 tablet (10 mg total) by mouth daily.  Dispense: 90 tablet; Refill: 1  4. Tobacco abuse  5. Coronary artery disease due to lipid rich plaque  6. Generalized anxiety disorder  7. Atherosclerosis of native coronary artery without angina pectoris, unspecified whether native or transplanted heart - isosorbide mononitrate (IMDUR) 30 MG 24 hr tablet; Take 0.5 tablets (15 mg total) by mouth daily.  Dispense: 90 tablet; Refill: 1 - furosemide (LASIX) 20 MG tablet; Take 1 tablet (20 mg total) by mouth daily.  Dispense: 90 tablet; Refill: 1  8. RLS (restless legs syndrome) - pramipexole (MIRAPEX) 0.5 MG tablet; Take 1 tablet (0.5 mg total) by mouth 2 (two) times daily.  Dispense: 180 tablet; Refill: 1  9. Encounter for vitamin deficiency screening - Vit D  25 hydroxy (rtn osteoporosis monitoring)  10. Depression - escitalopram (LEXAPRO) 10 MG tablet; Take 1 tablet (10 mg total) by mouth daily.  Dispense: 90 tablet; Refill: 1  11. Peripheral edema - furosemide (LASIX) 20 MG tablet; Take 1 tablet (20 mg total) by mouth daily.  Dispense: 90 tablet; Refill: 1   Continue all meds Labs pending Health Maintenance reviewed hemoccult cards given to patient with directions Diet and exercise encouraged RTO 2 weeks and then 3  months follow-up  Evelina Dun, FNP

## 2014-02-14 NOTE — Patient Instructions (Signed)
Peripheral Edema You have swelling in your legs (peripheral edema). This swelling is due to excess accumulation of salt and water in your body. Edema may be a sign of heart, kidney or liver disease, or a side effect of a medication. It may also be due to problems in the leg veins. Elevating your legs and using special support stockings may be very helpful, if the cause of the swelling is due to poor venous circulation. Avoid long periods of standing, whatever the cause. Treatment of edema depends on identifying the cause. Chips, pretzels, pickles and other salty foods should be avoided. Restricting salt in your diet is almost always needed. Water pills (diuretics) are often used to remove the excess salt and water from your body via urine. These medicines prevent the kidney from reabsorbing sodium. This increases urine flow. Diuretic treatment may also result in lowering of potassium levels in your body. Potassium supplements may be needed if you have to use diuretics daily. Daily weights can help you keep track of your progress in clearing your edema. You should call your caregiver for follow up care as recommended. SEEK IMMEDIATE MEDICAL CARE IF:   You have increased swelling, pain, redness, or heat in your legs.  You develop shortness of breath, especially when lying down.  You develop chest or abdominal pain, weakness, or fainting.  You have a fever. Document Released: 08/20/2004 Document Revised: 10/05/2011 Document Reviewed: 07/31/2009 [facility] Patient Information 2015 [place], [place]. This information is not intended to replace advice given to you by your health care provider. Make sure you discuss any questions you have with your health care provider. Health Maintenance, Female A healthy lifestyle and preventative care can promote health and wellness.  Maintain regular health, dental, and eye exams.  Eat a healthy diet. Foods like vegetables, fruits, whole grains, low-fat dairy products,  and lean protein foods contain the nutrients you need without too many calories. Decrease your intake of foods high in solid fats, added sugars, and salt. Get information about a proper diet from your caregiver, if necessary.  Regular physical exercise is one of the most important things you can do for your health. Most adults should get at least 150 minutes of moderate-intensity exercise (any activity that increases your heart rate and causes you to sweat) each week. In addition, most adults need muscle-strengthening exercises on 2 or more days a week.   Maintain a healthy weight. The body mass index (BMI) is a screening tool to identify possible weight problems. It provides an estimate of body fat based on height and weight. Your caregiver can help determine your BMI, and can help you achieve or maintain a healthy weight. For adults 20 years and older:  A BMI below 18.5 is considered underweight.  A BMI of 18.5 to 24.9 is normal.  A BMI of 25 to 29.9 is considered overweight.  A BMI of 30 and above is considered obese.  Maintain normal blood lipids and cholesterol by exercising and minimizing your intake of saturated fat. Eat a balanced diet with plenty of fruits and vegetables. Blood tests for lipids and cholesterol should begin at age 67 and be repeated every 5 years. If your lipid or cholesterol levels are high, you are over 50, or you are a high risk for heart disease, you may need your cholesterol levels checked more frequently.Ongoing high lipid and cholesterol levels should be treated with medicines if diet and exercise are not effective.  If you smoke, find out from your caregiver  how to quit. If you do not use tobacco, do not start.  Lung cancer screening is recommended for adults aged 24-80 years who are at high risk for developing lung cancer because of a history of smoking. Yearly low-dose computed tomography (CT) is recommended for people who have at least a 30-pack-year history of  smoking and are a current smoker or have quit within the past 15 years. A pack year of smoking is smoking an average of 1 pack of cigarettes a day for 1 year (for example: 1 pack a day for 30 years or 2 packs a day for 15 years). Yearly screening should continue until the smoker has stopped smoking for at least 15 years. Yearly screening should also be stopped for people who develop a health problem that would prevent them from having lung cancer treatment.  If you are pregnant, do not drink alcohol. If you are breastfeeding, be very cautious about drinking alcohol. If you are not pregnant and choose to drink alcohol, do not exceed 1 drink per day. One drink is considered to be 12 ounces (355 mL) of beer, 5 ounces (148 mL) of wine, or 1.5 ounces (44 mL) of liquor.  Avoid use of street drugs. Do not share needles with anyone. Ask for help if you need support or instructions about stopping the use of drugs.  High blood pressure causes heart disease and increases the risk of stroke. Blood pressure should be checked at least every 1 to 2 years. Ongoing high blood pressure should be treated with medicines, if weight loss and exercise are not effective.  If you are 48 to 57 years old, ask your caregiver if you should take aspirin to prevent strokes.  Diabetes screening involves taking a blood sample to check your fasting blood sugar level. This should be done once every 3 years, after age 61, if you are within normal weight and without risk factors for diabetes. Testing should be considered at a younger age or be carried out more frequently if you are overweight and have at least 1 risk factor for diabetes.  Breast cancer screening is essential preventative care for women. You should practice "breast self-awareness." This means understanding the normal appearance and feel of your breasts and may include breast self-examination. Any changes detected, no matter how small, should be reported to a caregiver. Women  in their 82s and 30s should have a clinical breast exam (CBE) by a caregiver as part of a regular health exam every 1 to 3 years. After age 30, women should have a CBE every year. Starting at age 80, women should consider having a mammogram (breast X-ray) every year. Women who have a family history of breast cancer should talk to their caregiver about genetic screening. Women at a high risk of breast cancer should talk to their caregiver about having an MRI and a mammogram every year.  Breast cancer gene (BRCA)-related cancer risk assessment is recommended for women who have family members with BRCA-related cancers. BRCA-related cancers include breast, ovarian, tubal, and peritoneal cancers. Having family members with these cancers may be associated with an increased risk for harmful changes (mutations) in the breast cancer genes BRCA1 and BRCA2. Results of the assessment will determine the need for genetic counseling and BRCA1 and BRCA2 testing.  The Pap test is a screening test for cervical cancer. Women should have a Pap test starting at age 58. Between ages 28 and 38, Pap tests should be repeated every 2 years. Beginning at age  30, you should have a Pap test every 3 years as long as the past 3 Pap tests have been normal. If you had a hysterectomy for a problem that was not cancer or a condition that could lead to cancer, then you no longer need Pap tests. If you are between ages 74 and 8, and you have had normal Pap tests going back 10 years, you no longer need Pap tests. If you have had past treatment for cervical cancer or a condition that could lead to cancer, you need Pap tests and screening for cancer for at least 20 years after your treatment. If Pap tests have been discontinued, risk factors (such as a new sexual partner) need to be reassessed to determine if screening should be resumed. Some women have medical problems that increase the chance of getting cervical cancer. In these cases, your  caregiver may recommend more frequent screening and Pap tests.  The human papillomavirus (HPV) test is an additional test that may be used for cervical cancer screening. The HPV test looks for the virus that can cause the cell changes on the cervix. The cells collected during the Pap test can be tested for HPV. The HPV test could be used to screen women aged 83 years and older, and should be used in women of any age who have unclear Pap test results. After the age of 90, women should have HPV testing at the same frequency as a Pap test.  Colorectal cancer can be detected and often prevented. Most routine colorectal cancer screening begins at the age of 9 and continues through age 63. However, your caregiver may recommend screening at an earlier age if you have risk factors for colon cancer. On a yearly basis, your caregiver may provide home test kits to check for hidden blood in the stool. Use of a small camera at the end of a tube, to directly examine the colon (sigmoidoscopy or colonoscopy), can detect the earliest forms of colorectal cancer. Talk to your caregiver about this at age 34, when routine screening begins. Direct examination of the colon should be repeated every 5 to 10 years through age 73, unless early forms of pre-cancerous polyps or small growths are found.  Hepatitis C blood testing is recommended for all people born from [birth year] through 1964 and any individual with known risks for hepatitis C.  Practice safe sex. Use condoms and avoid high-risk sexual practices to reduce the spread of sexually transmitted infections (STIs). Sexually active women aged 34 and younger should be checked for Chlamydia, which is a common sexually transmitted infection. Older women with new or multiple partners should also be tested for Chlamydia. Testing for other STIs is recommended if you are sexually active and at increased risk.  Osteoporosis is a disease in which the bones lose minerals and strength with  aging. This can result in serious bone fractures. The risk of osteoporosis can be identified using a bone density scan. Women ages 46 and over and women at risk for fractures or osteoporosis should discuss screening with their caregivers. Ask your caregiver whether you should be taking a calcium supplement or vitamin D to reduce the rate of osteoporosis.  Menopause can be associated with physical symptoms and risks. Hormone replacement therapy is available to decrease symptoms and risks. You should talk to your caregiver about whether hormone replacement therapy is right for you.  Use sunscreen. Apply sunscreen liberally and repeatedly throughout the day. You should seek shade when your shadow is shorter  than you. Protect yourself by wearing long sleeves, pants, a wide-brimmed hat, and sunglasses year round, whenever you are outdoors.  Notify your caregiver of new moles or changes in moles, especially if there is a change in shape or color. Also notify your caregiver if a mole is larger than the size of a pencil eraser.  Stay current with your immunizations. Document Released: 01/26/2011 Document Revised: 11/07/2012 Document Reviewed: 06/14/2013 [facility] Patient Information 2015 [place], [place]. This information is not intended to replace advice given to you by your health care provider. Make sure you discuss any questions you have with your health care provider.

## 2014-02-15 LAB — CMP14+EGFR
A/G RATIO: 2 (ref 1.1–2.5)
ALBUMIN: 4.4 g/dL (ref 3.5–5.5)
ALK PHOS: 99 IU/L (ref 39–117)
ALT: 44 IU/L — ABNORMAL HIGH (ref 0–32)
AST: 32 IU/L (ref 0–40)
BUN / CREAT RATIO: 11 (ref 9–23)
BUN: 9 mg/dL (ref 6–24)
CALCIUM: 9.7 mg/dL (ref 8.7–10.2)
CO2: 21 mmol/L (ref 18–29)
CREATININE: 0.81 mg/dL (ref 0.57–1.00)
Chloride: 100 mmol/L (ref 97–108)
GFR calc Af Amer: 93 mL/min/{1.73_m2} (ref >59)
GFR, EST NON AFRICAN AMERICAN: 81 mL/min/{1.73_m2} (ref >59)
GLOBULIN, TOTAL: 2.2 g/dL (ref 1.5–4.5)
Glucose: 197 mg/dL — ABNORMAL HIGH (ref 65–99)
Potassium: 4.5 mmol/L (ref 3.5–5.2)
Sodium: 141 mmol/L (ref 134–144)
Total Bilirubin: 0.4 mg/dL (ref 0.0–1.2)
Total Protein: 6.6 g/dL (ref 6.0–8.5)

## 2014-02-15 LAB — LIPID PANEL
CHOL/HDL RATIO: 5.2 ratio — AB (ref 0.0–4.4)
Cholesterol, Total: 171 mg/dL (ref 100–199)
HDL: 33 mg/dL — AB (ref >39)
LDL CALC: 80 mg/dL (ref 0–99)
TRIGLYCERIDES: 290 mg/dL — AB (ref 0–149)
VLDL Cholesterol Cal: 58 mg/dL — ABNORMAL HIGH (ref 5–40)

## 2014-02-15 LAB — VITAMIN D 25 HYDROXY (VIT D DEFICIENCY, FRACTURES): Vit D, 25-Hydroxy: 23.3 ng/mL — ABNORMAL LOW (ref 30.0–100.0)

## 2014-02-19 ENCOUNTER — Other Ambulatory Visit: Payer: Self-pay | Admitting: Family

## 2014-02-19 MED ORDER — ATORVASTATIN CALCIUM 80 MG PO TABS: 80.0000 mg | ORAL_TABLET | Freq: Every day | ORAL | Status: DC

## 2014-02-19 MED ORDER — VITAMIN D (ERGOCALCIFEROL) 1.25 MG (50000 UNIT) PO CAPS
50000.0000 [IU] | ORAL_CAPSULE | ORAL | Status: DC
Start: 2014-02-19 — End: 2015-03-12

## 2014-02-20 ENCOUNTER — Other Ambulatory Visit: Payer: Self-pay | Admitting: Family

## 2014-02-20 ENCOUNTER — Encounter: Payer: Self-pay | Admitting: Family Medicine

## 2014-02-20 NOTE — Progress Notes (Unsigned)
Blood sugar elevated- Pt needs a Hgb A 1C drawn  Kidney function stable- Liver enzymes slightly elevated- Will continue to monitor- May r/t to triglycerides levels   Triglycerides high- New rx sent to pharmacy Vit D low-RX sent to pharmacy

## 2014-02-28 ENCOUNTER — Encounter: Payer: Self-pay | Admitting: Family

## 2014-02-28 ENCOUNTER — Telehealth: Payer: Self-pay | Admitting: *Deleted

## 2014-02-28 ENCOUNTER — Ambulatory Visit (INDEPENDENT_AMBULATORY_CARE_PROVIDER_SITE_OTHER): Payer: Medicare HMO | Admitting: Family

## 2014-02-28 VITALS — BP 112/69 | HR 80 | Temp 98.0°F | Ht 63.5 in | Wt 225.0 lb

## 2014-02-28 DIAGNOSIS — R0602 Shortness of breath: Secondary | ICD-10-CM

## 2014-02-28 DIAGNOSIS — F411 Generalized anxiety disorder: Secondary | ICD-10-CM

## 2014-02-28 DIAGNOSIS — R739 Hyperglycemia, unspecified: Secondary | ICD-10-CM

## 2014-02-28 DIAGNOSIS — R7309 Other abnormal glucose: Secondary | ICD-10-CM

## 2014-02-28 DIAGNOSIS — E781 Pure hyperglyceridemia: Secondary | ICD-10-CM

## 2014-02-28 LAB — POCT GLYCOSYLATED HEMOGLOBIN (HGB A1C): Hemoglobin A1C: 6.9

## 2014-02-28 MED ORDER — ESCITALOPRAM OXALATE 20 MG PO TABS: 20.0000 mg | ORAL_TABLET | Freq: Every day | ORAL | Status: DC

## 2014-02-28 MED ORDER — ICOSAPENT ETHYL 1 G PO CAPS: 2.0000 | ORAL_CAPSULE | Freq: Two times a day (BID) | ORAL | Status: DC

## 2014-02-28 MED ORDER — ALBUTEROL SULFATE HFA 108 (90 BASE) MCG/ACT IN AERS: 2.0000 | INHALATION_SPRAY | Freq: Four times a day (QID) | RESPIRATORY_TRACT | Status: DC | PRN

## 2014-02-28 NOTE — Progress Notes (Signed)
   Subjective:    Patient ID: [name], female    DOB: 1955/08/19, 58 y.o.   MRN: [id number]  Hypertension Pertinent negatives include no shortness of breath.   Pt presents to the office for follow-up for GAD and depression. Pt was seen in the office two weeks ago and started on Lexapro 10 mg daily. Pt reports feeling "better" but still have some irritability at times. Pt was also started on lasix for peripheral edema and reports the "swelling is much better". Pt states her feet aren't normal, but much better. Pt states the swelling is constant.    Review of Systems  Constitutional: Negative.   HENT: Negative.   Eyes: Negative.   Respiratory: Negative.  Negative for shortness of breath.   Cardiovascular: Positive for leg swelling.  Gastrointestinal: Negative.   Endocrine: Negative.   Genitourinary: Negative.   Musculoskeletal: Negative.   Neurological: Negative.   Hematological: Negative.   Psychiatric/Behavioral: Positive for agitation.  All other systems reviewed and are negative.      Objective:   Physical Exam  Vitals reviewed. Constitutional: She is oriented to person, place, and time. She appears well-developed and well-nourished. No distress.  Cardiovascular: Normal rate, regular rhythm, normal heart sounds and intact distal pulses.   No murmur heard. Pulmonary/Chest: Effort normal and breath sounds normal. No respiratory distress. She has no wheezes.  Abdominal: Soft. Bowel sounds are normal. She exhibits no distension. There is no tenderness.  Musculoskeletal: Normal range of motion. She exhibits edema (trace amt of edema in bilateral ankles). She exhibits no tenderness.  Neurological: She is alert and oriented to person, place, and time. She has normal reflexes. No cranial nerve deficit.  Skin: Skin is warm and dry.  Psychiatric: She has a normal mood and affect. Her behavior is normal. Judgment and thought content normal.     BP 112/69  Pulse 80  Temp(Src)  98 F (36.7 C) (Oral)  Ht 5' 3.5" (1.613 m)  Wt 225 lb (102.059 kg)  BMI 39.23 kg/m2  LMP 10/17/2005      Assessment & Plan:  1. Hyperglycemia - POCT glycosylated hemoglobin (Hb A1C)  2. Hypertriglyceridemia -Encouraged low fat diet and exercise  Icosapent Ethyl 1 G CAPS; Take 2 capsules by mouth 2 (two) times daily.  Dispense: 120 capsule; Refill: 1  3. Generalized anxiety disorder - escitalopram (LEXAPRO) 20 MG tablet; Take 1 tablet (20 mg total) by mouth daily.  Dispense: 90 tablet; Refill: 3 -Stress management discussed -RTO 3 months  4. SOB (shortness of breath) -Smoking cessation discussed - albuterol (PROVENTIL HFA;VENTOLIN HFA) 108 (90 BASE) MCG/ACT inhaler; Inhale 2 puffs into the lungs every 6 (six) hours as needed for wheezing or shortness of breath.  Dispense: 1 Inhaler; Refill: [place], FNP

## 2014-02-28 NOTE — Telephone Encounter (Signed)
Aware of lab results  

## 2014-02-28 NOTE — Patient Instructions (Signed)
Fat and Cholesterol Control Diet Fat and cholesterol levels in your blood and organs are influenced by your diet. High levels of fat and cholesterol may lead to diseases of the heart, small and large blood vessels, gallbladder, liver, and pancreas. CONTROLLING FAT AND CHOLESTEROL WITH DIET Although exercise and lifestyle factors are important, your diet is key. That is because certain foods are known to raise cholesterol and others to lower it. The goal is to balance foods for their effect on cholesterol and more importantly, to replace saturated and trans fat with other types of fat, such as monounsaturated fat, polyunsaturated fat, and omega-3 fatty acids. On average, a person should consume no more than 15 to 17 g of saturated fat daily. Saturated and trans fats are considered "bad" fats, and they will raise LDL cholesterol. Saturated fats are primarily found in animal products such as meats, butter, and cream. However, that does not mean you need to give up all your favorite foods. Today, there are good tasting, low-fat, low-cholesterol substitutes for most of the things you like to eat. Choose low-fat or nonfat alternatives. Choose round or loin cuts of red meat. These types of cuts are lowest in fat and cholesterol. Chicken (without the skin), fish, veal, and ground turkey breast are great choices. Eliminate fatty meats, such as hot dogs and salami. Even shellfish have little or no saturated fat. Have a 3 oz (85 g) portion when you eat lean meat, poultry, or fish. Trans fats are also called "partially hydrogenated oils." They are oils that have been scientifically manipulated so that they are solid at room temperature resulting in a longer shelf life and improved taste and texture of foods in which they are added. Trans fats are found in stick margarine, some tub margarines, cookies, crackers, and baked goods.  When baking and cooking, oils are a great substitute for butter. The monounsaturated oils are  especially beneficial since it is believed they lower LDL and raise HDL. The oils you should avoid entirely are saturated tropical oils, such as coconut and palm.  Remember to eat a lot from food groups that are naturally free of saturated and trans fat, including fish, fruit, vegetables, beans, grains (barley, rice, couscous, bulgur wheat), and pasta (without cream sauces).  IDENTIFYING FOODS THAT LOWER FAT AND CHOLESTEROL  Soluble fiber may lower your cholesterol. This type of fiber is found in fruits such as apples, vegetables such as broccoli, potatoes, and carrots, legumes such as beans, peas, and lentils, and grains such as barley. Foods fortified with plant sterols (phytosterol) may also lower cholesterol. You should eat at least 2 g per day of these foods for a cholesterol lowering effect.  Read package labels to identify low-saturated fats, trans fat free, and low-fat foods at the supermarket. Select cheeses that have only 2 to 3 g saturated fat per ounce. Use a heart-healthy tub margarine that is free of trans fats or partially hydrogenated oil. When buying baked goods (cookies, crackers), avoid partially hydrogenated oils. Breads and muffins should be made from whole grains (whole-wheat or whole oat flour, instead of "flour" or "enriched flour"). Buy non-creamy canned soups with reduced salt and no added fats.  FOOD PREPARATION TECHNIQUES  Never deep-fry. If you must fry, either stir-fry, which uses very little fat, or use non-stick cooking sprays. When possible, broil, bake, or roast meats, and steam vegetables. Instead of putting butter or margarine on vegetables, use lemon and herbs, applesauce, and cinnamon (for squash and sweet potatoes). Use nonfat   yogurt, salsa, and low-fat dressings for salads.  LOW-SATURATED FAT / LOW-FAT FOOD SUBSTITUTES Meats / Saturated Fat (g)  Avoid: Steak, marbled (3 oz/85 g) / 11 g  Choose: Steak, lean (3 oz/85 g) / 4 g  Avoid: Hamburger (3 oz/85 g) / 7  g  Choose: Hamburger, lean (3 oz/85 g) / 5 g  Avoid: Ham (3 oz/85 g) / 6 g  Choose: Ham, lean cut (3 oz/85 g) / 2.4 g  Avoid: Chicken, with skin, dark meat (3 oz/85 g) / 4 g  Choose: Chicken, skin removed, dark meat (3 oz/85 g) / 2 g  Avoid: Chicken, with skin, light meat (3 oz/85 g) / 2.5 g  Choose: Chicken, skin removed, light meat (3 oz/85 g) / 1 g Dairy / Saturated Fat (g)  Avoid: Whole milk (1 cup) / 5 g  Choose: Low-fat milk, 2% (1 cup) / 3 g  Choose: Low-fat milk, 1% (1 cup) / 1.5 g  Choose: Skim milk (1 cup) / 0.3 g  Avoid: Hard cheese (1 oz/28 g) / 6 g  Choose: Skim milk cheese (1 oz/28 g) / 2 to 3 g  Avoid: Cottage cheese, 4% fat (1 cup) / 6.5 g  Choose: Low-fat cottage cheese, 1% fat (1 cup) / 1.5 g  Avoid: Ice cream (1 cup) / 9 g  Choose: Sherbet (1 cup) / 2.5 g  Choose: Nonfat frozen yogurt (1 cup) / 0.3 g  Choose: Frozen fruit bar / trace  Avoid: Whipped cream (1 tbs) / 3.5 g  Choose: Nondairy whipped topping (1 tbs) / 1 g Condiments / Saturated Fat (g)  Avoid: Mayonnaise (1 tbs) / 2 g  Choose: Low-fat mayonnaise (1 tbs) / 1 g  Avoid: Butter (1 tbs) / 7 g  Choose: Extra light margarine (1 tbs) / 1 g  Avoid: Coconut oil (1 tbs) / 11.8 g  Choose: Olive oil (1 tbs) / 1.8 g  Choose: Corn oil (1 tbs) / 1.7 g  Choose: Safflower oil (1 tbs) / 1.2 g  Choose: Sunflower oil (1 tbs) / 1.4 g  Choose: Soybean oil (1 tbs) / 2.4 g  Choose: Canola oil (1 tbs) / 1 g Document Released: 07/13/2005 Document Revised: 11/07/2012 Document Reviewed: 10/11/2013 [employer] Patient Information 2015 [employer]. This information is not intended to replace advice given to you by your health care provider. Make sure you discuss any questions you have with your health care provider.  

## 2014-04-09 ENCOUNTER — Telehealth: Payer: Self-pay | Admitting: Family

## 2014-04-09 NOTE — Telephone Encounter (Signed)
Explained that we do not get samples of albuterol She complains of a cough and congestion x 1 week. She does have COPD. Does not have a maintenance inhaler. Appt scheduled tomorrow for COPD exacerbation.  Patient aware.

## 2014-04-10 ENCOUNTER — Encounter: Payer: Self-pay | Admitting: Family

## 2014-04-10 ENCOUNTER — Ambulatory Visit (INDEPENDENT_AMBULATORY_CARE_PROVIDER_SITE_OTHER): Payer: Medicare HMO | Admitting: Family

## 2014-04-10 VITALS — BP 111/74 | HR 76 | Temp 96.9°F | Ht 63.5 in | Wt 223.2 lb

## 2014-04-10 DIAGNOSIS — J441 Chronic obstructive pulmonary disease with (acute) exacerbation: Secondary | ICD-10-CM

## 2014-04-10 MED ORDER — ALBUTEROL SULFATE HFA 108 (90 BASE) MCG/ACT IN AERS: 2.0000 | INHALATION_SPRAY | Freq: Four times a day (QID) | RESPIRATORY_TRACT | Status: DC | PRN

## 2014-04-10 MED ORDER — AMOXICILLIN-POT CLAVULANATE 875-125 MG PO TABS: 1.0000 | ORAL_TABLET | Freq: Two times a day (BID) | ORAL | Status: DC

## 2014-04-10 MED ORDER — BUDESONIDE-FORMOTEROL FUMARATE 160-4.5 MCG/ACT IN AERO: 2.0000 | INHALATION_SPRAY | Freq: Two times a day (BID) | RESPIRATORY_TRACT | Status: DC

## 2014-04-10 MED ORDER — PREDNISONE 10 MG PO TABS: 10.0000 mg | ORAL_TABLET | Freq: Every day | ORAL | Status: DC

## 2014-04-10 MED ORDER — BENZONATATE 200 MG PO CAPS: 200.0000 mg | ORAL_CAPSULE | Freq: Three times a day (TID) | ORAL | Status: DC | PRN

## 2014-04-10 NOTE — Patient Instructions (Signed)
Chronic Asthmatic Bronchitis Chronic asthmatic bronchitis is a complication of persistent asthma. After a period of time with asthma, some people develop airflow obstruction that is present all the time, even when not having an asthma attack.There is also persistent inflammation of the airways, and the bronchial tubes produce more mucus. Chronic asthmatic bronchitis usually is a permanent problem with the lungs. CAUSES  Chronic asthmatic bronchitis happens most often in people who have asthma and also smoke cigarettes. Occasionally, it can happen to a person with long-standing or severe asthma even if the person is not a smoker. SIGNS AND SYMPTOMS  Chronic asthmatic bronchitis usually causes symptoms of both asthma and chronic bronchitis, including:   Coughing.  Increased sputum production.  Wheezing and shortness of breath.  Chest discomfort.  Recurring infections. DIAGNOSIS  Your health care provider will take a medical history and perform a physical exam. Chronic asthmatic bronchitis is suspected when a person with asthma has abnormal results on breathing tests (pulmonary function tests) even when breathing symptoms are at their best. Other tests, such as a chest X-ray, may be performed to rule out other conditions.  TREATMENT  Treatment involves controlling symptoms with medicine and lifestyle changes.  Your health care provider may prescribe asthma medicines, including inhaler and nebulizer medicines.  Infection can be treated with medicine to kill germs (antibiotics). Serious infections may require hospitalization. These can include:  Pneumonia.  Sinus infections.  Acute bronchitis.   Preventing infection and hospitalization is very important. Get an influenza vaccination every year as directed by your health care provider. Ask your health care provider whether you need a pneumonia vaccine.  Ask your health care provider whether you would benefit from a pulmonary  rehabilitation program. HOME CARE INSTRUCTIONS  Take medicines only as directed by your health care provider.  If you are a cigarette smoker, the most important thing that you can do is quit. Talk to your health care provider for help with quitting smoking.  Avoid pollen, dust, animal dander, molds, smoke, and other things that cause attacks.  Regular exercise is very important to help you feel better. Discuss possible exercise routines with your health care provider.  If animal dander is the cause of asthma, you may not be able to keep pets.  It is important that you:  Become educated about your medical condition.  Participate in maintaining wellness.  Seek medical care as directed. Delay in seeking medical care could cause permanent injury and may be a risk to your life. SEEK MEDICAL CARE IF:  You have wheezing and shortness of breath even if taking medicine to prevent attacks.  You have muscle aches, chest pain, or thickening of sputum.  Your sputum changes from clear or white to yellow, green, gray, or bloody. SEEK IMMEDIATE MEDICAL CARE IF:  Your usual medicines do not stop your wheezing.  You have increased coughing or shortness of breath or both.  You have increased difficulty breathing.  You have any problems from the medicine you are taking, such as a rash, itching, swelling, or trouble breathing. MAKE SURE YOU:   Understand these instructions.  Will watch your condition.  Will get help right away if you are not doing well or get worse. Document Released: 04/30/2006 Document Revised: 11/27/2013 Document Reviewed: 08/21/2013 [employer] Patient Information 2015 [employer]. This information is not intended to replace advice given to you by your health care provider. Make sure you discuss any questions you have with your health care provider.  

## 2014-04-10 NOTE — Progress Notes (Signed)
Subjective:    Patient ID: [name], female    DOB: 03-10-1956, 58 y.o.   MRN: [id number]  Cough This is a new problem. The current episode started in the past 7 days. The problem has been unchanged. The problem occurs hourly. The cough is productive of purulent sputum. Associated symptoms include headaches, nasal congestion, postnasal drip, rhinorrhea, shortness of breath and wheezing. Pertinent negatives include no chills, ear congestion, ear pain, fever, hemoptysis or sore throat. The symptoms are aggravated by lying down. She has tried rest and ipratropium inhaler for the symptoms. The treatment provided mild relief. Her past medical history is significant for COPD.      Review of Systems  Constitutional: Negative.  Negative for fever and chills.  HENT: Positive for postnasal drip and rhinorrhea. Negative for ear pain and sore throat.   Eyes: Negative.   Respiratory: Positive for cough, shortness of breath and wheezing. Negative for hemoptysis.   Cardiovascular: Negative.  Negative for palpitations.  Gastrointestinal: Negative.   Endocrine: Negative.   Genitourinary: Negative.   Musculoskeletal: Negative.   Neurological: Positive for headaches.  Hematological: Negative.   Psychiatric/Behavioral: Negative.   All other systems reviewed and are negative.      Objective:   Physical Exam  Vitals reviewed. Constitutional: She is oriented to person, place, and time. She appears well-developed and well-nourished. No distress.  HENT:  Head: Normocephalic and atraumatic.  Right Ear: External ear normal.  Left Ear: External ear normal.  Nasal passage erythemas with mild swelling Oropharynx erythemas  Eyes: Pupils are equal, round, and reactive to light.  Neck: Normal range of motion. Neck supple. No thyromegaly present.  Cardiovascular: Normal rate, regular rhythm, normal heart sounds and intact distal pulses.   No murmur heard. Pulmonary/Chest: Effort normal. No  respiratory distress. She has wheezes.  Abdominal: Soft. Bowel sounds are normal. She exhibits no distension. There is no tenderness.  Musculoskeletal: Normal range of motion. She exhibits no edema and no tenderness.  Neurological: She is alert and oriented to person, place, and time. She has normal reflexes. No cranial nerve deficit.  Skin: Skin is warm and dry.  Psychiatric: She has a normal mood and affect. Her behavior is normal. Judgment and thought content normal.      BP 111/74  Pulse 76  Temp(Src) 96.9 F (36.1 C) (Oral)  Ht 5' 3.5" (1.613 m)  Wt 223 lb 3.2 oz (101.243 kg)  BMI 38.91 kg/m2  LMP 10/17/2005     Assessment & Plan:  1. COPD exacerbation -- Take meds as prescribed - Use a cool mist humidifier  -Use saline nose sprays frequently -Saline irrigations of the nose can be very helpful if done frequently.  * 4X daily for 1 week*  * Use of a nettie pot can be helpful with this. Follow directions with this* -Force fluids -For any cough or congestion  Use plain Mucinex- regular strength or max strength is fine - albuterol (PROVENTIL HFA;VENTOLIN HFA) 108 (90 BASE) MCG/ACT inhaler; Inhale 2 puffs into the lungs every 6 (six) hours as needed for wheezing or shortness of breath.  Dispense: 1 Inhaler; Refill: 2 - benzonatate (TESSALON) 200 MG capsule; Take 1 capsule (200 mg total) by mouth 3 (three) times daily as needed for cough.  Dispense: 30 capsule; Refill: 3 - budesonide-formoterol (SYMBICORT) 160-4.5 MCG/ACT inhaler; Inhale 2 puffs into the lungs 2 (two) times daily.  Dispense: 1 Inhaler; Refill: 3 - predniSONE (DELTASONE) 10 MG tablet; Take 1 tablet (10 mg  total) by mouth daily with breakfast.  Dispense: 30 tablet; Refill: 0 - amoxicillin-clavulanate (AUGMENTIN) 875-125 MG per tablet; Take 1 tablet by mouth 2 (two) times daily.  Dispense: 14 tablet; Refill: 0  Evelina Dun, FNP

## 2014-04-12 ENCOUNTER — Telehealth: Payer: Self-pay | Admitting: Family

## 2014-04-12 NOTE — Telephone Encounter (Signed)
Patient aware to go to ER if any more chest pain she was told to stop the medication and christy will address tomorrow She said she has been fine since not taking anymore of the med

## 2014-04-13 NOTE — Telephone Encounter (Signed)
Pt called. Pt told not to restart medication. Will draw blood work on next visit and see what cholesterol and triglycerides levels are.

## 2014-06-04 ENCOUNTER — Ambulatory Visit: Payer: Medicare HMO | Admitting: Nurse Practitioner

## 2014-07-25 ENCOUNTER — Encounter: Payer: Self-pay | Admitting: Internal Medicine

## 2014-08-21 ENCOUNTER — Other Ambulatory Visit: Payer: Self-pay | Admitting: Family

## 2014-09-22 DIAGNOSIS — Z720 Tobacco use: Secondary | ICD-10-CM | POA: Diagnosis not present

## 2014-09-22 DIAGNOSIS — J40 Bronchitis, not specified as acute or chronic: Secondary | ICD-10-CM | POA: Diagnosis not present

## 2014-10-01 ENCOUNTER — Other Ambulatory Visit: Payer: Self-pay | Admitting: Family

## 2014-10-01 NOTE — Telephone Encounter (Signed)
Last seen 04/10/14 [name]  If approved route to nurse to call into [facility]

## 2014-10-01 NOTE — Telephone Encounter (Signed)
rx called into pharmacy voicemail.

## 2014-10-01 NOTE — Telephone Encounter (Signed)
Please advise on refill- last seen 04/10/14, no follow up scheduled.

## 2014-11-20 ENCOUNTER — Other Ambulatory Visit: Payer: Self-pay | Admitting: Family

## 2014-11-27 ENCOUNTER — Telehealth: Payer: Self-pay | Admitting: Physician Assistant

## 2014-11-27 NOTE — Telephone Encounter (Signed)
Patient offered appointment for tomorrow and wanted to be seen for a medication refill at the same time. I advised patient that i can get her in tomorrow for acute appointment but not for a medication refill. I advised patient that I can schedule her at a later date for a medication refill.

## 2014-12-21 ENCOUNTER — Other Ambulatory Visit: Payer: Self-pay | Admitting: Family Medicine

## 2014-12-21 NOTE — Telephone Encounter (Signed)
Last seen 04/10/14 [facility]

## 2015-01-29 ENCOUNTER — Other Ambulatory Visit: Payer: Self-pay | Admitting: Family

## 2015-01-29 ENCOUNTER — Other Ambulatory Visit: Payer: Self-pay | Admitting: Family Medicine

## 2015-01-30 NOTE — Telephone Encounter (Signed)
Last seen 04/10/14 [facility]

## 2015-01-30 NOTE — Telephone Encounter (Signed)
Last seen 04/10/14  [facility]

## 2015-02-20 ENCOUNTER — Other Ambulatory Visit: Payer: Self-pay | Admitting: Family

## 2015-02-26 ENCOUNTER — Other Ambulatory Visit: Payer: Self-pay | Admitting: Family

## 2015-03-04 ENCOUNTER — Ambulatory Visit: Payer: Medicare HMO | Admitting: Family

## 2015-03-12 ENCOUNTER — Ambulatory Visit (INDEPENDENT_AMBULATORY_CARE_PROVIDER_SITE_OTHER): Payer: Commercial Managed Care - HMO | Admitting: Family

## 2015-03-12 ENCOUNTER — Encounter: Payer: Self-pay | Admitting: Family

## 2015-03-12 VITALS — BP 143/83 | HR 67 | Temp 97.4°F | Ht 63.5 in | Wt 214.8 lb

## 2015-03-12 DIAGNOSIS — Z720 Tobacco use: Secondary | ICD-10-CM | POA: Diagnosis not present

## 2015-03-12 DIAGNOSIS — E559 Vitamin D deficiency, unspecified: Secondary | ICD-10-CM | POA: Insufficient documentation

## 2015-03-12 DIAGNOSIS — J449 Chronic obstructive pulmonary disease, unspecified: Secondary | ICD-10-CM

## 2015-03-12 DIAGNOSIS — E785 Hyperlipidemia, unspecified: Secondary | ICD-10-CM | POA: Diagnosis not present

## 2015-03-12 DIAGNOSIS — I10 Essential (primary) hypertension: Secondary | ICD-10-CM | POA: Diagnosis not present

## 2015-03-12 DIAGNOSIS — R5383 Other fatigue: Secondary | ICD-10-CM | POA: Diagnosis not present

## 2015-03-12 DIAGNOSIS — I2510 Atherosclerotic heart disease of native coronary artery without angina pectoris: Secondary | ICD-10-CM

## 2015-03-12 DIAGNOSIS — R0602 Shortness of breath: Secondary | ICD-10-CM | POA: Diagnosis not present

## 2015-03-12 DIAGNOSIS — Z1159 Encounter for screening for other viral diseases: Secondary | ICD-10-CM

## 2015-03-12 DIAGNOSIS — I2583 Coronary atherosclerosis due to lipid rich plaque: Secondary | ICD-10-CM

## 2015-03-12 DIAGNOSIS — G2581 Restless legs syndrome: Secondary | ICD-10-CM | POA: Diagnosis not present

## 2015-03-12 DIAGNOSIS — C50912 Malignant neoplasm of unspecified site of left female breast: Secondary | ICD-10-CM

## 2015-03-12 DIAGNOSIS — W57XXXA Bitten or stung by nonvenomous insect and other nonvenomous arthropods, initial encounter: Secondary | ICD-10-CM

## 2015-03-12 DIAGNOSIS — F411 Generalized anxiety disorder: Secondary | ICD-10-CM

## 2015-03-12 MED ORDER — ISOSORBIDE MONONITRATE ER 30 MG PO TB24: 15.0000 mg | ORAL_TABLET | Freq: Every day | ORAL | Status: DC

## 2015-03-12 MED ORDER — ESCITALOPRAM OXALATE 20 MG PO TABS: 20.0000 mg | ORAL_TABLET | Freq: Every day | ORAL | Status: DC

## 2015-03-12 MED ORDER — ATORVASTATIN CALCIUM 80 MG PO TABS: 80.0000 mg | ORAL_TABLET | Freq: Every day | ORAL | Status: DC

## 2015-03-12 MED ORDER — LISINOPRIL 5 MG PO TABS: 5.0000 mg | ORAL_TABLET | Freq: Every day | ORAL | Status: DC

## 2015-03-12 MED ORDER — METOPROLOL TARTRATE 25 MG PO TABS: 25.0000 mg | ORAL_TABLET | Freq: Two times a day (BID) | ORAL | Status: DC

## 2015-03-12 MED ORDER — PRAMIPEXOLE DIHYDROCHLORIDE 0.5 MG PO TABS: 0.5000 mg | ORAL_TABLET | Freq: Every evening | ORAL | Status: DC | PRN

## 2015-03-12 MED ORDER — ALPRAZOLAM 1 MG PO TABS: 1.0000 mg | ORAL_TABLET | Freq: Two times a day (BID) | ORAL | Status: DC | PRN

## 2015-03-12 MED ORDER — FLUTICASONE FUROATE-VILANTEROL 100-25 MCG/INH IN AEPB: 1.0000 | INHALATION_SPRAY | Freq: Every day | RESPIRATORY_TRACT | Status: DC

## 2015-03-12 NOTE — Patient Instructions (Signed)

## 2015-03-12 NOTE — Addendum Note (Signed)
Addended by: Evelina Dun A on: 03/12/2015 12:30 PM   Modules accepted: Orders

## 2015-03-12 NOTE — Progress Notes (Signed)
Subjective:    Patient ID: [name], female    DOB: 1955/10/30, 59 y.o.   MRN: [id number]  Pt presents to the office today for chronic follow up and medication refill.  Hyperlipidemia This is a chronic problem. The current episode started more than 1 year ago. The problem is controlled. Recent lipid tests were reviewed and are normal. She has no history of diabetes or hypothyroidism. Factors aggravating her hyperlipidemia include smoking. Associated symptoms include shortness of breath (Pt has COPD). Pertinent negatives include no chest pain or myalgias. Current antihyperlipidemic treatment includes statins. The current treatment provides moderate improvement of lipids. Risk factors for coronary artery disease include dyslipidemia, hypertension, obesity, a sedentary lifestyle, stress and family history.  Hypertension This is a chronic problem. The current episode started more than 1 year ago. The problem has been waxing and waning since onset. The problem is uncontrolled. Associated symptoms include anxiety, headaches and shortness of breath (Pt has COPD). Pertinent negatives include no chest pain, palpitations or peripheral edema. Risk factors for coronary artery disease include dyslipidemia, family history, obesity, post-menopausal state, sedentary lifestyle, smoking/tobacco exposure and stress. Past treatments include beta blockers and ACE inhibitors. The current treatment provides mild improvement. Hypertensive end-organ damage includes CAD/MI. There is no history of kidney disease, CVA, heart failure or a thyroid problem. There is no history of sleep apnea.  Anxiety Presents for follow-up visit. Onset was 1 to 6 months ago. The problem has been waxing and waning. Symptoms include excessive worry, insomnia, nervous/anxious behavior and shortness of breath (Pt has COPD). Patient reports no chest pain, decreased concentration or palpitations. Symptoms occur most days. The symptoms are aggravated  by family issues. The quality of sleep is poor.   Her past medical history is significant for anxiety/panic attacks, CAD and depression. Past treatments include SSRIs and benzodiazephines. The treatment provided moderate relief. Compliance with prior treatments has been good.  Restless Leg Syndrome Pt currently taking mirapex 0.5 mg every night. Pt states this seems to help, but she states she is having insomnia not related to her restless leg syndrome. Insomnia Pt has xanax 0.5 mg she can use to help her rest, however, pt states it does not help unless she take 2-3 tabs. Pt states when she takes 2-3 tabs she feels very "spaced" out. Pt states she has stopped taking them all together because she does not feel like they work and does not want to feel "spaced out".      Review of Systems  Constitutional: Negative.   Eyes: Negative.   Respiratory: Positive for shortness of breath (Pt has COPD).   Cardiovascular: Negative.  Negative for chest pain and palpitations.  Gastrointestinal: Negative.   Endocrine: Negative.   Genitourinary: Negative.   Musculoskeletal: Negative.  Negative for myalgias.  Neurological: Positive for headaches.  Hematological: Negative.   Psychiatric/Behavioral: Negative for decreased concentration. The patient is nervous/anxious and has insomnia.   All other systems reviewed and are negative.      Objective:   Physical Exam  Constitutional: She is oriented to person, place, and time. She appears well-developed and well-nourished. No distress.  HENT:  Head: Normocephalic and atraumatic.  Right Ear: External ear normal.  Mouth/Throat: Oropharynx is clear and moist.  Eyes: Pupils are equal, round, and reactive to light.  Neck: Normal range of motion. Neck supple. No thyromegaly present.  Cardiovascular: Normal rate, regular rhythm, normal heart sounds and intact distal pulses.   No murmur heard. Pulmonary/Chest: Effort normal and breath  sounds normal. No  respiratory distress. She has no wheezes.  Abdominal: Soft. Bowel sounds are normal. She exhibits no distension. There is no tenderness.  Musculoskeletal: Normal range of motion. She exhibits no edema or tenderness.  Neurological: She is alert and oriented to person, place, and time. She has normal reflexes. No cranial nerve deficit.  Skin: Skin is warm and dry.  Psychiatric: She has a normal mood and affect. Her behavior is normal. Judgment and thought content normal.  Vitals reviewed.     BP 153/84 mmHg  Pulse 68  Temp(Src) 97.4 F (36.3 C) (Oral)  Ht 5' 3.5" (1.613 m)  Wt 214 lb 12.8 oz (97.433 kg)  BMI 37.45 kg/m2  LMP 10/17/2005     Assessment & Plan:  1. Generalized anxiety disorder - CMP14+EGFR - escitalopram (LEXAPRO) 20 MG tablet; Take 1 tablet (20 mg total) by mouth daily.  Dispense: 90 tablet; Refill: 3 - ALPRAZolam (XANAX) 1 MG tablet; Take 1 tablet (1 mg total) by mouth 2 (two) times daily as needed for anxiety.  Dispense: 20 tablet; Refill: 0  2. Shortness of breath - Anemia Profile B - CMP14+EGFR  3. Tobacco abuse - CMP14+EGFR  4. Hyperlipidemia - CMP14+EGFR - Lipid panel - atorvastatin (LIPITOR) 80 MG tablet; Take 1 tablet (80 mg total) by mouth daily.  Dispense: 90 tablet; Refill: 3  5. Chronic obstructive pulmonary disease, unspecified COPD, unspecified chronic bronchitis type -Smoking cessation discussed - CMP14+EGFR - Fluticasone Furoate-Vilanterol (BREO ELLIPTA) 100-25 MCG/INH AEPB; Inhale 1 puff into the lungs daily.  Dispense: 60 each; Refill: 3  6. Essential hypertension  - CMP14+EGFR - metoprolol tartrate (LOPRESSOR) 25 MG tablet; Take 1 tablet (25 mg total) by mouth 2 (two) times daily.  Dispense: 180 tablet; Refill: 3 - lisinopril (PRINIVIL,ZESTRIL) 5 MG tablet; Take 1 tablet (5 mg total) by mouth daily.  Dispense: 90 tablet; Refill: 3  7. Coronary artery disease due to lipid rich plaque - CMP14+EGFR  8. Breast cancer, left -  CMP14+EGFR  9. Restless leg syndrome - CMP14+EGFR - pramipexole (MIRAPEX) 0.5 MG tablet; Take 1-2 tablets (0.5-1 mg total) by mouth at bedtime as needed.  Dispense: 180 tablet; Refill: 2  10. Atherosclerosis of native coronary artery without angina pectoris, unspecified whether native or transplanted heart - CMP14+EGFR - isosorbide mononitrate (IMDUR) 30 MG 24 hr tablet; Take 0.5 tablets (15 mg total) by mouth daily.  Dispense: 90 tablet; Refill: 2  11. Other fatigue - Anemia Profile B - CMP14+EGFR - Thyroid Panel With TSH  12. Vitamin D deficiency - CMP14+EGFR - Vit D  25 hydroxy (rtn osteoporosis monitoring)  13. Need for hepatitis C screening test - CMP14+EGFR - Hepatitis C antibody   Continue all meds Labs pending Health Maintenance reviewed Diet and exercise encouraged RTO 2 week to recheck HTN  Evelina Dun, FNP

## 2015-03-13 LAB — ANEMIA PROFILE B
Basophils Absolute: 0 10*3/uL (ref 0.0–0.2)
Basos: 0 %
EOS (ABSOLUTE): 0.3 10*3/uL (ref 0.0–0.4)
EOS: 3 %
FOLATE: 3.4 ng/mL (ref >3.0)
Ferritin: 51 ng/mL (ref 15–150)
HEMATOCRIT: 50 % — AB (ref 34.0–46.6)
Hemoglobin: 16.6 g/dL — ABNORMAL HIGH (ref 11.1–15.9)
IRON: 44 ug/dL (ref 27–159)
Immature Grans (Abs): 0 10*3/uL (ref 0.0–0.1)
Immature Granulocytes: 0 %
Iron Saturation: 16 % (ref 15–55)
Lymphocytes Absolute: 1.4 10*3/uL (ref 0.7–3.1)
Lymphs: 11 %
MCH: 31.3 pg (ref 26.6–33.0)
MCHC: 33.2 g/dL (ref 31.5–35.7)
MCV: 94 fL (ref 79–97)
Monocytes Absolute: 0.5 10*3/uL (ref 0.1–0.9)
Monocytes: 4 %
NEUTROS ABS: 10 10*3/uL — AB (ref 1.4–7.0)
Neutrophils: 82 %
PLATELETS: 195 10*3/uL (ref 150–379)
RBC: 5.31 x10E6/uL — ABNORMAL HIGH (ref 3.77–5.28)
RDW: 14 % (ref 12.3–15.4)
RETIC CT PCT: 1.1 % (ref 0.6–2.6)
TIBC: 269 ug/dL (ref 250–450)
UIBC: 225 ug/dL (ref 131–425)
VITAMIN B 12: 1243 pg/mL — AB (ref 211–946)
WBC: 12.3 10*3/uL — ABNORMAL HIGH (ref 3.4–10.8)

## 2015-03-13 LAB — LIPID PANEL
CHOLESTEROL TOTAL: 166 mg/dL (ref 100–199)
Chol/HDL Ratio: 4.2 ratio units (ref 0.0–4.4)
HDL: 40 mg/dL (ref >39)
LDL CALC: 91 mg/dL (ref 0–99)
Triglycerides: 174 mg/dL — ABNORMAL HIGH (ref 0–149)
VLDL Cholesterol Cal: 35 mg/dL (ref 5–40)

## 2015-03-13 LAB — THYROID PANEL WITH TSH
Free Thyroxine Index: 1.8 (ref 1.2–4.9)
T3 Uptake Ratio: 29 % (ref 24–39)
T4 TOTAL: 6.3 ug/dL (ref 4.5–12.0)
TSH: 1.68 u[IU]/mL (ref 0.450–4.500)

## 2015-03-13 LAB — VITAMIN D 25 HYDROXY (VIT D DEFICIENCY, FRACTURES): VIT D 25 HYDROXY: 31.4 ng/mL (ref 30.0–100.0)

## 2015-03-13 LAB — CMP14+EGFR
ALBUMIN: 4.3 g/dL (ref 3.5–5.5)
ALT: 14 IU/L (ref 0–32)
AST: 14 IU/L (ref 0–40)
Albumin/Globulin Ratio: 2 (ref 1.1–2.5)
Alkaline Phosphatase: 88 IU/L (ref 39–117)
BILIRUBIN TOTAL: 0.2 mg/dL (ref 0.0–1.2)
BUN / CREAT RATIO: 10 (ref 9–23)
BUN: 8 mg/dL (ref 6–24)
CHLORIDE: 103 mmol/L (ref 97–108)
CO2: 23 mmol/L (ref 18–29)
Calcium: 9.5 mg/dL (ref 8.7–10.2)
Creatinine, Ser: 0.84 mg/dL (ref 0.57–1.00)
GFR calc non Af Amer: 77 mL/min/{1.73_m2} (ref >59)
GFR, EST AFRICAN AMERICAN: 89 mL/min/{1.73_m2} (ref >59)
GLOBULIN, TOTAL: 2.1 g/dL (ref 1.5–4.5)
Glucose: 176 mg/dL — ABNORMAL HIGH (ref 65–99)
Potassium: 4.4 mmol/L (ref 3.5–5.2)
SODIUM: 143 mmol/L (ref 134–144)
TOTAL PROTEIN: 6.4 g/dL (ref 6.0–8.5)

## 2015-03-13 LAB — LYME AB/WESTERN BLOT REFLEX: LYME DISEASE AB, QUANT, IGM: <0.8 index (ref 0.00–0.79)

## 2015-03-13 LAB — HEPATITIS C ANTIBODY: HEP C VIRUS AB: 0.1 {s_co_ratio} (ref 0.0–0.9)

## 2015-03-15 ENCOUNTER — Other Ambulatory Visit: Payer: Self-pay | Admitting: Family

## 2015-03-15 LAB — SPECIMEN STATUS REPORT

## 2015-03-15 LAB — HGB A1C W/O EAG: Hgb A1c MFr Bld: 7.3 % — ABNORMAL HIGH (ref 4.8–5.6)

## 2015-03-15 MED ORDER — METFORMIN HCL 500 MG PO TABS: 500.0000 mg | ORAL_TABLET | Freq: Two times a day (BID) | ORAL | Status: DC

## 2015-03-26 ENCOUNTER — Ambulatory Visit: Payer: Commercial Managed Care - HMO | Admitting: Family

## 2015-04-04 ENCOUNTER — Ambulatory Visit: Payer: Commercial Managed Care - HMO | Admitting: Pharmacist

## 2015-04-05 ENCOUNTER — Ambulatory Visit: Payer: Commercial Managed Care - HMO | Admitting: Pediatrics

## 2015-04-11 ENCOUNTER — Ambulatory Visit: Payer: Commercial Managed Care - HMO | Admitting: Pediatrics

## 2015-04-18 ENCOUNTER — Ambulatory Visit (INDEPENDENT_AMBULATORY_CARE_PROVIDER_SITE_OTHER): Payer: Commercial Managed Care - HMO

## 2015-04-18 ENCOUNTER — Other Ambulatory Visit: Payer: Self-pay | Admitting: Pharmacist

## 2015-04-18 ENCOUNTER — Ambulatory Visit (INDEPENDENT_AMBULATORY_CARE_PROVIDER_SITE_OTHER): Payer: Commercial Managed Care - HMO | Admitting: Pharmacist

## 2015-04-18 ENCOUNTER — Encounter: Payer: Self-pay | Admitting: Pharmacist

## 2015-04-18 VITALS — BP 128/82 | HR 78 | Ht 64.0 in | Wt 215.5 lb

## 2015-04-18 DIAGNOSIS — G4700 Insomnia, unspecified: Secondary | ICD-10-CM

## 2015-04-18 DIAGNOSIS — K1379 Other lesions of oral mucosa: Secondary | ICD-10-CM

## 2015-04-18 DIAGNOSIS — F411 Generalized anxiety disorder: Secondary | ICD-10-CM

## 2015-04-18 DIAGNOSIS — R5383 Other fatigue: Secondary | ICD-10-CM

## 2015-04-18 DIAGNOSIS — E119 Type 2 diabetes mellitus without complications: Secondary | ICD-10-CM | POA: Insufficient documentation

## 2015-04-18 DIAGNOSIS — Z0000 Encounter for general adult medical examination without abnormal findings: Secondary | ICD-10-CM | POA: Diagnosis not present

## 2015-04-18 DIAGNOSIS — Z780 Asymptomatic menopausal state: Secondary | ICD-10-CM

## 2015-04-18 DIAGNOSIS — E1129 Type 2 diabetes mellitus with other diabetic kidney complication: Secondary | ICD-10-CM | POA: Insufficient documentation

## 2015-04-18 MED ORDER — ALPRAZOLAM 1 MG PO TABS: 1.0000 mg | ORAL_TABLET | Freq: Two times a day (BID) | ORAL | Status: DC | PRN

## 2015-04-18 MED ORDER — BLOOD GLUCOSE MONITOR KIT: PACK | Status: DC

## 2015-04-18 NOTE — Patient Instructions (Addendum)
[name] , Thank you for taking time to come for your Medicare Wellness Visit. I appreciate your ongoing commitment to your health goals. Please review the following plan we discussed and let me know if I can assist you in the future.   These are the goals we discussed: Eye exam Sleep Study    This is a list of the screening recommended for you and due dates:  Health Maintenance  Topic Date Due  . Flu Shot  02/25/2015  . Pap Smear  Due now  . Mammogram  Appointment made for Monday, October 31st at 1:15pm with [facility] [place]  . Tetanus Vaccine  09/12/2015*  . Lipid (cholesterol) test  06/12/2015  . Colon Cancer Screening  02/14/2017  .  Hepatitis C: One time screening is recommended by Center for Disease Control  (CDC) for  adults born from [birth year] through 1964.   Completed  . HIV Screening  Addressed  *Topic was postponed. The date shown is not the original due date.   Diabetes and Standards of Medical Care   Diabetes is complicated. You may find that your diabetes team includes a dietitian, nurse, diabetes educator, eye doctor, and more. To help everyone know what is going on and to help you get the care you deserve, the following schedule of care was developed to help keep you on track. Below are the tests, exams, vaccines, medicines, education, and plans you will need.  Blood Glucose Goals Prior to meals = 80 - 130 Within 2 hours of the start of a meal = less than 180  HbA1c test (goal is less than 7.0% - your last value was 7.3%) This test shows how well you have controlled your glucose over the past 2 to 3 months. It is used to see if your diabetes management plan needs to be adjusted.   It is performed at least 2 times a year if you are meeting treatment goals.  It is performed 4 times a year if therapy has changed or if you are not meeting treatment goals.  Blood pressure test  This test is performed at every routine medical  visit. The goal is less than 140/90 mmHg for most people, but 130/80 mmHg in some cases. Ask your health care provider about your goal.  Dental exam  Follow up with the dentist regularly.  Eye exam  If you are diagnosed with type 1 diabetes as a child, get an exam upon reaching the age of 36 years or older and have had diabetes for 3 to 5 years. Yearly eye exams are recommended after that initial eye exam.  If you are diagnosed with type 1 diabetes as an adult, get an exam within 5 years of diagnosis and then yearly.  If you are diagnosed with type 2 diabetes, get an exam as soon as possible after the diagnosis and then yearly.  Foot care exam  Visual foot exams are performed at every routine medical visit. The exams check for cuts, injuries, or other problems with the feet.  A comprehensive foot exam should be done yearly. This includes visual inspection as well as assessing foot pulses and testing for loss of sensation.  Check your feet nightly for cuts, injuries, or other problems with your feet. Tell your health care provider if anything is not healing.  Kidney function test (urine microalbumin)  This test is performed once a year.  Type 1 diabetes: The first test is performed 5 years after  diagnosis.  Type 2 diabetes: The first test is performed at the time of diagnosis.  A serum creatinine and estimated glomerular filtration rate (eGFR) test is done once a year to assess the level of chronic kidney disease (CKD), if present.  Lipid profile (cholesterol, HDL, LDL, triglycerides)  Performed every 5 years for most people.  The goal for LDL is less than 100 mg/dL. If you are at high risk, the goal is less than 70 mg/dL.  The goal for HDL is 40 mg/dL to 50 mg/dL for men and 50 mg/dL to 60 mg/dL for women. An HDL cholesterol of 60 mg/dL or higher gives some protection against heart disease.  The goal for triglycerides is less than 150 mg/dL.  Influenza vaccine, pneumococcal  vaccine, and hepatitis B vaccine  The influenza vaccine is recommended yearly.  The pneumococcal vaccine is generally given once in a lifetime. However, there are some instances when another vaccination is recommended. Check with your health care provider.  The hepatitis B vaccine is also recommended for adults with diabetes.  Diabetes self-management education  Education is recommended at diagnosis and ongoing as needed.  Treatment plan  Your treatment plan is reviewed at every medical visit.  Document Released: 05/10/2009 Document Revised: 03/15/2013 Document Reviewed: 12/13/2012 [facility] Patient Information 2014 [place].     Kegel Exercises The goal of Kegel exercises is to isolate and exercise your pelvic floor muscles. These muscles act as a hammock that supports the rectum, vagina, small intestine, and uterus. As the muscles weaken, the hammock sags and these organs are displaced from their normal positions. Kegel exercises can strengthen your pelvic floor muscles and help you to improve bladder and bowel control, improve sexual response, and help reduce many problems and some discomfort during pregnancy. Kegel exercises can be done anywhere and at any time. HOW TO PERFORM [place] your pelvic floor muscles. To do this, squeeze (contract) the muscles that you use when you try to stop the flow of urine. You will feel a tightness in the vaginal area (women) and a tight lift in the rectal area (men and women).  When you begin, contract your pelvic muscles tight for 2-5 seconds, then relax them for 2-5 seconds. This is one set. Do 4-5 sets with a short pause in between.  Contract your pelvic muscles for 8-10 seconds, then relax them for 8-10 seconds. Do 4-5 sets. If you cannot contract your pelvic muscles for 8-10 seconds, try 5-7 seconds and work your way up to 8-10 seconds. Your goal is 4-5 sets of 10 contractions each day. Keep your stomach, buttocks, and  legs relaxed during the exercises. Perform sets of both short and long contractions. Vary your positions. Perform these contractions 3-4 times per day. Perform sets while you are:   Lying in bed in the morning.  Standing at lunch.  Sitting in the late afternoon.  Lying in bed at night. You should do 40-50 contractions per day. Do not perform more Kegel exercises per day than recommended. Overexercising can cause muscle fatigue. Continue these exercises for for at least 15-20 weeks or as directed by your caregiver. Document Released: 06/29/2012 Document Reviewed: 06/29/2012 [facility] Patient Information 2015 [place]. This information is not intended to replace advice given to you by your health care provider. Make sure you discuss any questions you have with your health care provider.   Fall Prevention and Home Safety Falls cause injuries and can affect all age groups. It is possible  to use preventive measures to significantly decrease the likelihood of falls. There are many simple measures which can make your home safer and prevent falls. OUTDOORS  Repair cracks and edges of walkways and driveways.  Remove high doorway thresholds.  Trim shrubbery on the main path into your home.  Have good outside lighting.  Clear walkways of tools, rocks, debris, and clutter.  Check that handrails are not broken and are securely fastened. Both sides of steps should have handrails.  Have leaves, snow, and ice cleared regularly.  Use sand or salt on walkways during winter months.  In the garage, clean up grease or oil spills. BATHROOM  Install night lights.  Install grab bars by the toilet and in the tub and shower.  Use non-skid mats or decals in the tub or shower.  Place a plastic non-slip stool in the shower to sit on, if needed.  Keep floors dry and clean up all water on the floor immediately.  Remove soap buildup in the tub or shower on a regular basis.  Secure bath mats  with non-slip, double-sided rug tape.  Remove throw rugs and tripping hazards from the floors. BEDROOMS  Install night lights.  Make sure a bedside light is easy to reach.  Do not use oversized bedding.  Keep a telephone by your bedside.  Have a firm chair with side arms to use for getting dressed.  Remove throw rugs and tripping hazards from the floor. KITCHEN  Keep handles on pots and pans turned toward the center of the stove. Use back burners when possible.  Clean up spills quickly and allow time for drying.  Avoid walking on wet floors.  Avoid hot utensils and knives.  Position shelves so they are not too high or low.  Place commonly used objects within easy reach.  If necessary, use a sturdy step stool with a grab bar when reaching.  Keep electrical cables out of the way.  Do not use floor polish or wax that makes floors slippery. If you must use wax, use non-skid floor wax.  Remove throw rugs and tripping hazards from the floor. STAIRWAYS  Never leave objects on stairs.  Place handrails on both sides of stairways and use them. Fix any loose handrails. Make sure handrails on both sides of the stairways are as long as the stairs.  Check carpeting to make sure it is firmly attached along stairs. Make repairs to worn or loose carpet promptly.  Avoid placing throw rugs at the top or bottom of stairways, or properly secure the rug with carpet tape to prevent slippage. Get rid of throw rugs, if possible.  Have an electrician put in a light switch at the top and bottom of the stairs. OTHER FALL PREVENTION TIPS  Wear low-heel or rubber-soled shoes that are supportive and fit well. Wear closed toe shoes.  When using a stepladder, make sure it is fully opened and both spreaders are firmly locked. Do not climb a closed stepladder.  Add color or contrast paint or tape to grab bars and handrails in your home. Place contrasting color strips on first and last  steps.  Learn and use mobility aids as needed. Install an electrical emergency response system.  Turn on lights to avoid dark areas. Replace light bulbs that burn out immediately. Get light switches that glow.  Arrange furniture to create clear pathways. Keep furniture in the same place.  Firmly attach carpet with non-skid or double-sided tape.  Eliminate uneven floor surfaces.  Select  a carpet pattern that does not visually hide the edge of steps.  Be aware of all pets. OTHER HOME SAFETY TIPS  Set the water temperature for 120 F (48.8 C).  Keep emergency numbers on or near the telephone.  Keep smoke detectors on every level of the home and near sleeping areas. Document Released: 07/03/2002 Document Revised: 01/12/2012 Document Reviewed: 10/02/2011 [facility] Patient Information 2015 [place], [place]. This information is not intended to replace advice given to you by your health care provider. Make sure you discuss any questions you have with your health care provider.

## 2015-04-18 NOTE — Progress Notes (Signed)
Patient ID: [name], female   DOB: August 23, 1955, 59 y.o.   MRN: [id number]    Subjective:   [name] is a 59 y.o. female who presents for an Initial Medicare Annual Wellness Visit.  She lives with her disables sister.  She is retired but regularly keeps her 42 yo granddaughter during the day.  She has also recently been diagnosed with type 2 DM.  She was started on metformin 572m bid but she admits that she sometime forgets the morning dose as she is use to taking her medications before bedtime.  She is not checking BG at home but BG in office today was 100.   She is also concerned about not sleeping well and even when she does think she has slept well she does not feel rested.  She has history of B12 anemia and RLS. She also has a nodule on the jawline of her right jaw which she is concerned about because its size has increased over the last month.  Current Medications (verified) Outpatient Encounter Prescriptions as of 04/18/2015  Medication Sig  . aspirin 325 MG tablet Take 325 mg by mouth daily.    .[name] Kitchenatorvastatin (LIPITOR) 80 MG tablet Take 1 tablet (80 mg total) by mouth daily.  . Cholecalciferol (VITAMIN D3) 3000 UNITS TABS Take 1 tablet by mouth daily.  .[name] Kitchenescitalopram (LEXAPRO) 20 MG tablet Take 1 tablet (20 mg total) by mouth daily.  . fish oil-omega-3 fatty acids 1000 MG capsule Take 2 g by mouth 2 (two) times daily.   . Fluticasone Furoate-Vilanterol (BREO ELLIPTA) 100-25 MCG/INH AEPB Inhale 1 puff into the lungs daily.  . isosorbide mononitrate (IMDUR) 30 MG 24 hr tablet Take 0.5 tablets (15 mg total) by mouth daily.  .[name] Kitchenlisinopril (PRINIVIL,ZESTRIL) 5 MG tablet Take 1 tablet (5 mg total) by mouth daily.  . metFORMIN (GLUCOPHAGE) 500 MG tablet Take 1 tablet (500 mg total) by mouth 2 (two) times daily with a meal.  . metoprolol tartrate (LOPRESSOR) 25 MG tablet Take 1 tablet (25 mg total) by mouth 2 (two) times daily.  . pramipexole (MIRAPEX) 0.5 MG tablet Take 1-2 tablets  (0.5-1 mg total) by mouth at bedtime as needed.  . vitamin B-12 (CYANOCOBALAMIN) 1000 MCG tablet Take 1,000 mcg by mouth daily.  . [DISCONTINUED] ALPRAZolam (XANAX) 1 MG tablet Take 1 tablet (1 mg total) by mouth 2 (two) times daily as needed for anxiety.  .[name] Kitchenalbuterol (PROVENTIL HFA;VENTOLIN HFA) 108 (90 BASE) MCG/ACT inhaler Inhale 2 puffs into the lungs every 6 (six) hours as needed for wheezing or shortness of breath. (Patient not taking: Reported on 04/18/2015)   No facility-administered encounter medications on file as of 04/18/2015.    Allergies (verified) Food   History: Past Medical History  Diagnosis Date  . Depression with anxiety   . Hyperlipidemia   . Stable angina     a. med Rx after cath 05/2011  . Chronic kidney disease   . CAD (coronary artery disease)     NSTEMI 4/08: OM3 occluded (PCI unsuccessful), dRCA 95% => BMS, inf HK, EF 50%;   b. MV 11/12:  [place] ischemia => c.  [place]:  dLAD 70%, OM1 70-80%, then occluded (no change from 2008), dOM filled L->L collats, mRCA stent occluded, dRCA filled L->R collats, EF 55-65% => med Rx (consider PCI of RCA if refractory angina)  . COPD (chronic obstructive pulmonary disease)   . Headache(784.0)     sinus; occ. migraines  . GERD (  gastroesophageal reflux disease)     OTC acid reducer prn  . Hypertension     under control; has been on med. x 4 yrs.  . Stress incontinence, female   . Breast cancer 06/30/11    inv ductal, ER/PR +, her-2 -  . Carotid stenosis     dopplers 5/08: 0-30% bilateral; 10/12: 0-39% B/L ICA => f/u 04/2013  . Hx of radiation therapy 08/24/11 to 10/07/11    L breast  . Depression   . Anxiety   . Myocardial infarction    Past Surgical History  Procedure Laterality Date  . Coronary stent placement  11/02/2006  . Cesarean section  1987  . Elbow surgery      right  . Cardiac catheterization  11/02/2006; 06/03/2011  . Mastectomy partial / lumpectomy  06/30/2011    left  . Axillary lymph node dissection   07/31/2011    Procedure: AXILLARY LYMPH NODE DISSECTION;  Surgeon: [name], MD;  Location: [facility];  Service: General;  Laterality: Left;  left axillary sentinal node biopsy  . Breast surgery     Family History  Problem Relation Age of Onset  . Heart disease Mother   . Heart disease Father   . Cancer Maternal Aunt     pt unaware of what kind  . Cancer Maternal Grandmother     ovarian   Social History   Occupational History  . Not on file.   Social History Main Topics  . Smoking status: Current Every Day Smoker -- 1.00 packs/day for 35 years    Types: Cigarettes  . Smokeless tobacco: Never Used  . Alcohol Use: 0.6 oz/week    1 Glasses of wine per week     Comment: occasionally  . Drug Use: No  . Sexual Activity: No    Do you feel safe at home?  Yes  Dietary issues and exercise activities: Current Exercise Habits:: The patient does not participate in regular exercise at present  Current Dietary habits:  Not following any specific diet currently  Objective:    Today's Vitals   04/18/15 1618  BP: 128/82  Pulse: 78  Height: 5' 4"  (1.626 m)  Weight: 215 lb 8 oz (97.75 kg)  PainSc: 4   PainLoc: Head   Body mass index is 36.97 kg/(m^2).  A1c = 7.3% (02/2015)  Activities of Daily Living In your present state of health, do you have any difficulty performing the following activities: 04/18/2015  Hearing? Y  Vision? N  Difficulty concentrating or making decisions? Y  Walking or climbing stairs? N  Dressing or bathing? N  Doing errands, shopping? N  Preparing Food and eating ? N  Using the Toilet? N  In the past six months, have you accidently leaked urine? Y  Do you have problems with loss of bowel control? N  Managing your Medications? N  Managing your Finances? N  Housekeeping or managing your Housekeeping? N    Are there smokers in your home (other than you)? No   Cardiac Risk Factors include: advanced age (>69mn, >>39 women);diabetes mellitus;hypertension;obesity (BMI >30kg/m2);sedentary lifestyle;smoking/ tobacco exposure  Depression Screen PHQ 2/9 Scores 04/18/2015 03/12/2015  PHQ - 2 Score 2 0  PHQ- 9 Score 7 -    Fall Risk Fall Risk  04/18/2015 03/12/2015  Falls in the past year? Yes No  Number falls in past yr: 1 -  Injury with Fall? No -  Follow up Falls prevention discussed -  Cognitive Function: MMSE - Mini Mental State Exam 04/18/2015  Orientation to time 5  Orientation to Place 5  Registration 3  Attention/ Calculation 5  Recall 3  Language- name 2 objects 2  Language- repeat 1  Language- follow 3 step command 3  Language- read & follow direction 1  Write a sentence 1  Copy design 1  Total score 30    Immunizations and Health Maintenance Immunization History  Administered Date(s) Administered  . Tdap 05/17/2001   Health Maintenance Due  Topic Date Due  . INFLUENZA VACCINE  02/25/2015    Patient Care Team: Sharion Balloon, FNP as PCP - General (Nurse Practitioner)  Indicate any recent Medical Services you may have received from other than [facility] providers in the past year (date may be approximate).    Assessment:    Annual Wellness Visit  Type 2 DM Smoker - not ready to quit Nodule on right jaw Insomnia / RSL Urinary incontinence Obesity Normal BMD - checked today   Screening Tests Health Maintenance  Topic Date Due  . INFLUENZA VACCINE  02/25/2015  . PAP SMEAR  06/12/2015 (Originally 05/24/2004)  . MAMMOGRAM  09/12/2015 (Originally 12/22/2014)  . TETANUS/TDAP  09/12/2015 (Originally 05/18/2011)  . LIPID PANEL  06/12/2015  . COLONOSCOPY  02/14/2017  . Hepatitis C Screening  Completed  . HIV Screening  Addressed        Plan:   During the course of the visit Ivon was educated and counseled about the following appropriate screening and preventive services:   Vaccines to include Pneumoccal, Influenza, Hepatitis B, Td, Zostavax - patient refused influenza  vaccine  Colorectal cancer screening - UTD  Cardiovascular disease screening - UTD - triglycerides were slightly elevated at 174.  Better BG control should improve Tg.  Patient is currently taking atorvastatin - continue  Diabetes - discussed CHO counting diet in depth - spent over 20 minutes  Taught how to use glucometer and One Touch Verio Flex given in office today.  Patient instrusted to check BG qod.  Rx sent to pharmacy for test strips and lancet  Bone Denisty / Osteoporosis Screening - done today  Calcium 1246m daily from combo of supplementation and diet  Continue vitamin D 3000iu daily  Mammogram - appt made for 04/29/15  Referral to dermatology for nodule on jaw  Glaucoma screening / Diabetic Eye Exam - patient instructed to make appt.  She will see about after dermatologist as she is concerned about money and copay  [facility] / [facility] on right jaw line.  She recommended patient be evaluated by [place] made.  Referral for sleep study  Smoking cessation counseling - discussed but patient is not ready to quit at this time.  Advanced Directives - information given today   Kegel exercises recommended as well as increase physical activity.   Patient Instructions (the written plan) were given to the patient.   ECherre Robins P[facility]  04/18/2015

## 2015-06-03 ENCOUNTER — Telehealth: Payer: Self-pay | Admitting: Family

## 2015-06-03 DIAGNOSIS — F411 Generalized anxiety disorder: Secondary | ICD-10-CM

## 2015-06-03 MED ORDER — ALPRAZOLAM 1 MG PO TABS: 1.0000 mg | ORAL_TABLET | Freq: Two times a day (BID) | ORAL | Status: DC | PRN

## 2015-06-03 NOTE — Telephone Encounter (Signed)
Patient aware,xanax was called to [facility] voice mail.

## 2015-06-03 NOTE — Telephone Encounter (Signed)
I have already sent to nurse to call in  Please address

## 2015-06-18 ENCOUNTER — Encounter: Payer: Self-pay | Admitting: Pediatrics

## 2015-06-18 ENCOUNTER — Ambulatory Visit (INDEPENDENT_AMBULATORY_CARE_PROVIDER_SITE_OTHER): Payer: Commercial Managed Care - HMO | Admitting: Pediatrics

## 2015-06-18 VITALS — BP 125/80 | HR 84 | Temp 98.4°F | Ht 64.0 in | Wt 213.4 lb

## 2015-06-18 DIAGNOSIS — Z720 Tobacco use: Secondary | ICD-10-CM | POA: Diagnosis not present

## 2015-06-18 DIAGNOSIS — J449 Chronic obstructive pulmonary disease, unspecified: Secondary | ICD-10-CM

## 2015-06-18 DIAGNOSIS — F411 Generalized anxiety disorder: Secondary | ICD-10-CM | POA: Diagnosis not present

## 2015-06-18 DIAGNOSIS — E119 Type 2 diabetes mellitus without complications: Secondary | ICD-10-CM | POA: Diagnosis not present

## 2015-06-18 LAB — POCT GLYCOSYLATED HEMOGLOBIN (HGB A1C): Hemoglobin A1C: 6.4

## 2015-06-18 MED ORDER — BUSPIRONE HCL 5 MG PO TABS: 10.0000 mg | ORAL_TABLET | Freq: Two times a day (BID) | ORAL | Status: DC

## 2015-06-18 MED ORDER — ALPRAZOLAM 1 MG PO TABS: ORAL_TABLET | ORAL | Status: DC

## 2015-06-18 NOTE — Progress Notes (Signed)
Subjective:    Patient ID: [name], female    DOB: 10/06/1955, 59 y.o.   MRN: [id number]  CC: anxiety f/u, smoking   HPI: [name] is a 59 y.o. female presenting on 06/18/2015 for Discuss medications  Anxiety: has tried paxil, other antidepressants in the past. Currently on lexapro. Has done therapy in the past, some CBT. Lots of stressors at home over past 2 months, mostly financial. Has been using 1/2 to 1 tab of xanax at night to help her sleep. Now getting more anxious that she only has five left. Mood is down due to stressors, but feels safe at home. No thoughts of self harm.  Insomnia: trazodone made her sick and dizzy. Goes to bed around 2am.  Just lays in bed, sometimes watches tv.  Smoking cessation: Close to 1/2 ppd at lowest, now back up to 1 ppd  No fevers No URI symptoms Normal sensation in feet b/l No SOB or chest pain Feels like her breathing is doing well   Relevant past medical, surgical, family and social history reviewed and updated as indicated. Interim medical history since our last visit reviewed. Allergies and medications reviewed and updated.   ROS: Per HPI unless specifically indicated above  Past Medical History Patient Active Problem List   Diagnosis Date Noted  . Type 2 diabetes mellitus ([place]) 04/18/2015  . Restless leg syndrome 03/12/2015  . Vitamin D deficiency 03/12/2015  . Generalized anxiety disorder 02/14/2014  . Malignant neoplasm of central portion of female breast ([place]) 08/20/2011  . Malignant neoplasm of upper-inner quadrant of female breast ([place]) 07/30/2011  . Myocardial infarction ([place])   . Breast cancer ([place]) 06/30/2011  . Chest pain, unspecified 05/19/2011  . Shortness of breath 05/19/2011  . Tobacco abuse 05/19/2011  . Breast mass 05/19/2011  . Hematochezia 05/19/2011  . Hypertension   . Hyperlipidemia   . CAD (coronary artery disease)   . COPD (chronic obstructive pulmonary disease) ([place])   . Carotid  stenosis     Current Outpatient Prescriptions  Medication Sig Dispense Refill  . ALPRAZolam (XANAX) 1 MG tablet Take 1 tablet up to twice a day if needed for anxiety 30 tablet 0  . aspirin 325 MG tablet Take 325 mg by mouth daily.      [name] atorvastatin (LIPITOR) 80 MG tablet Take 1 tablet (80 mg total) by mouth daily. 90 tablet 3  . blood glucose meter kit and supplies KIT Dispense based on pt and ins preference. Use up to QDas directed. (FOR ICD-10 E11.9). 1 each 0  . Cholecalciferol (VITAMIN D3) 3000 UNITS TABS Take 1 tablet by mouth daily.    [name] escitalopram (LEXAPRO) 20 MG tablet Take 1 tablet (20 mg total) by mouth daily. 90 tablet 3  . fish oil-omega-3 fatty acids 1000 MG capsule Take 2 g by mouth 2 (two) times daily.     . Fluticasone Furoate-Vilanterol (BREO ELLIPTA) 100-25 MCG/INH AEPB Inhale 1 puff into the lungs daily. 60 each 3  . isosorbide mononitrate (IMDUR) 30 MG 24 hr tablet Take 0.5 tablets (15 mg total) by mouth daily. 90 tablet 2  . lisinopril (PRINIVIL,ZESTRIL) 5 MG tablet Take 1 tablet (5 mg total) by mouth daily. 90 tablet 3  . metFORMIN (GLUCOPHAGE) 500 MG tablet Take 1 tablet (500 mg total) by mouth 2 (two) times daily with a meal. 180 tablet 3  . metoprolol tartrate (LOPRESSOR) 25 MG tablet Take 1 tablet (25 mg total) by mouth 2 (two)  times daily. 180 tablet 3  . pramipexole (MIRAPEX) 0.5 MG tablet Take 1-2 tablets (0.5-1 mg total) by mouth at bedtime as needed. 180 tablet 2  . busPIRone (BUSPAR) 5 MG tablet Take 2 tablets (10 mg total) by mouth 2 (two) times daily. 95 tablet 2  . vitamin B-12 (CYANOCOBALAMIN) 1000 MCG tablet Take 1,000 mcg by mouth daily.     No current facility-administered medications for this visit.       Objective:    BP 125/80 mmHg  Pulse 84  Temp(Src) 98.4 F (36.9 C) (Oral)  Ht 5' 4"  (1.626 m)  Wt 213 lb 6.4 oz (96.798 kg)  BMI 36.61 kg/m2  LMP 10/17/2005  Wt Readings from Last 3 Encounters:  06/18/15 213 lb 6.4 oz (96.798 kg)    04/18/15 215 lb 8 oz (97.75 kg)  03/12/15 214 lb 12.8 oz (97.433 kg)    Gen: NAD, alert, cooperative with exam, tearful at times EYES: EOMI, no scleral injection or icterus ENT:  TMs pearly gray b/l, OP without erythema LYMPH: no cervical LAD CV: NRRR, normal S1/S2, no murmur, distal pulses 2+ b/l Resp: CTABL, no wheezes, normal WOB, no prolonged exp phase Ext: No edema, warm Neuro: Alert and oriented, strength equal b/l UE and LE, coordination grossly normal MSK: normal muscle bulk     Assessment & Plan:   [name] was seen today for discuss medications.  Diagnoses and all orders for this visit:  Type 2 diabetes mellitus without complication, without long-term current use of insulin ([place]) Foot exam next visit. -     POCT glycosylated hemoglobin (Hb A1C) -     Microalbumin / creatinine urine ratio  Tobacco abuse Pt open to quitting, says things are too stressful now. Will continue to try to control anxiety.  COPD currently symptoms controlled, pt continues to smoke. Take Breo daily. Needs PFTs.  Generalized anxiety disorder  Discussed at length danger of respiratory suppression with xanax given COPD and need to get on alternate medicine for anxiety control. Xanax is not a long term option. Start buspar, cont SSRI. Rx for #30 tabs of xanax given today, has 5 tabs left at home. Only take as needed. Needs to be seen for any further xanax refills, pt aware and in agreement. Urine drug screen next visit if any controlled substances given. Buspar increase plan given. -     busPIRone (BUSPAR) 5 MG tablet; Take 2 tablets (10 mg total) by mouth 2 (two) times daily. -     ALPRAZolam (XANAX) 1 MG tablet; Take 1 tablet up to twice a day if needed for anxiety  I spent 25 minutes with the patient with over 50% of the encounter time dedicated to counseling on the above problems.  Follow up plan: 4 weeks  Assunta Found, MD [facility] 06/18/2015, 2:52 PM

## 2015-06-18 NOTE — Patient Instructions (Signed)
Take busprione 5mg  twice a day for a week Then take 1.5 tabs (7.5mg ) twice a day for a week Then take 2 tabs (10mg ) twice a day.  Take Breo every morning  Take xanax only if needed.

## 2015-06-19 LAB — MICROALBUMIN / CREATININE URINE RATIO
CREATININE, UR: 189.2 mg/dL
MICROALB/CREAT RATIO: 24 mg/g{creat} (ref 0.0–30.0)
MICROALBUM., U, RANDOM: 45.4 ug/mL

## 2015-07-22 ENCOUNTER — Other Ambulatory Visit: Payer: Self-pay | Admitting: Pediatrics

## 2015-07-23 NOTE — Telephone Encounter (Signed)
Last seen 06/18/15  Dr Evette Doffing  If approved route to nurse to call into [facility]

## 2015-07-24 ENCOUNTER — Telehealth: Payer: Self-pay | Admitting: Pediatrics

## 2015-07-24 ENCOUNTER — Other Ambulatory Visit: Payer: Self-pay | Admitting: *Deleted

## 2015-07-24 DIAGNOSIS — F411 Generalized anxiety disorder: Secondary | ICD-10-CM

## 2015-07-24 MED ORDER — ALPRAZOLAM 1 MG PO TABS: ORAL_TABLET | ORAL | Status: DC

## 2015-07-24 NOTE — Telephone Encounter (Signed)
As discussed at her last appt, pt needs to be seen for any further refills.

## 2015-07-24 NOTE — Telephone Encounter (Signed)
Called pt back, she is taking the xanax sometimes twice a day, sometimes none. No more than that. Breathing has been doing ok. Discussed need to treat anxiety better if still needing it xanax that often, may need to try different medicine other than lexapro. She took the buspar for a few days but made her sick to her stomach so stopped. OK to call in #30 tabs of xanax 1mg , take 1/2 to whole tab once a day as needed for anxiety, needs to be seen for any further refills. Pt voiced understanding.

## 2015-08-12 ENCOUNTER — Telehealth: Payer: Self-pay | Admitting: Pediatrics

## 2015-09-30 ENCOUNTER — Other Ambulatory Visit: Payer: Self-pay | Admitting: Pediatrics

## 2015-09-30 ENCOUNTER — Telehealth: Payer: Self-pay | Admitting: Pediatrics

## 2015-09-30 NOTE — Telephone Encounter (Signed)
Pt needs to be seen for further refills

## 2015-10-02 NOTE — Telephone Encounter (Signed)
[facility]. Unable to reach patient due to no voice mail.  Patient needs an office visit for further refills .

## 2015-10-14 ENCOUNTER — Telehealth: Payer: Self-pay | Admitting: Pediatrics

## 2015-10-14 DIAGNOSIS — F439 Reaction to severe stress, unspecified: Secondary | ICD-10-CM

## 2015-10-14 MED ORDER — ALPRAZOLAM 1 MG PO TABS: 1.0000 mg | ORAL_TABLET | Freq: Three times a day (TID) | ORAL | Status: DC | PRN

## 2015-10-14 NOTE — Telephone Encounter (Signed)
rx called into pharmacy and patient aware.  

## 2015-10-14 NOTE — Telephone Encounter (Signed)
OK to phone in xanax 1mg  #30 tabs, take TID prn. Pt's sister died unexpectedly yesterday, pt had to do CPR on sister. Not able to sleep now. Has tried [place] in the past and makes her groggy the next day. Pt aware short term increase.

## 2015-11-07 ENCOUNTER — Telehealth: Payer: Self-pay | Admitting: Pediatrics

## 2015-11-07 ENCOUNTER — Other Ambulatory Visit: Payer: Self-pay | Admitting: Pediatrics

## 2015-11-07 DIAGNOSIS — F439 Reaction to severe stress, unspecified: Secondary | ICD-10-CM

## 2015-11-07 MED ORDER — ALPRAZOLAM 1 MG PO TABS: 1.0000 mg | ORAL_TABLET | Freq: Three times a day (TID) | ORAL | Status: DC | PRN

## 2015-11-07 NOTE — Telephone Encounter (Signed)
Pt called in to request refill of Xanax 1mg  and also wants RX for Ambien She has had 2 refills of #30 on 3/8 and 3/20 Pt states "Dr Evette Doffing was working with me on this" Informed pt that Dr Evette Doffing is out on maternity leave and that she would probably NTBS since !!/2016 was last appt Pt states she can not come in for appt at this time Please review and advise

## 2015-11-07 NOTE — Telephone Encounter (Signed)
RX called into [facility] per MMM

## 2015-11-07 NOTE — Telephone Encounter (Signed)
Please call in xanax 1mg  1 po qd #30  with 0 refills

## 2015-11-09 NOTE — Telephone Encounter (Signed)
Prescription was phoned in by MMM on 11/07/15, contacted [facility] and prescription has been picked up by patient

## 2015-11-18 ENCOUNTER — Encounter: Payer: Commercial Managed Care - HMO | Admitting: *Deleted

## 2015-11-19 ENCOUNTER — Telehealth: Payer: Self-pay | Admitting: Pediatrics

## 2015-11-19 NOTE — Telephone Encounter (Signed)
Spoke with pt regarding RX for Xanax appt scheduled

## 2015-11-20 ENCOUNTER — Ambulatory Visit: Payer: Commercial Managed Care - HMO | Admitting: Nurse Practitioner

## 2015-11-21 ENCOUNTER — Ambulatory Visit: Payer: Commercial Managed Care - HMO | Admitting: Nurse Practitioner

## 2015-11-21 ENCOUNTER — Ambulatory Visit (INDEPENDENT_AMBULATORY_CARE_PROVIDER_SITE_OTHER): Payer: Commercial Managed Care - HMO | Admitting: Family Medicine

## 2015-11-21 ENCOUNTER — Encounter (INDEPENDENT_AMBULATORY_CARE_PROVIDER_SITE_OTHER): Payer: Self-pay

## 2015-11-21 ENCOUNTER — Encounter: Payer: Self-pay | Admitting: Family Medicine

## 2015-11-21 VITALS — BP 119/80 | HR 62 | Temp 97.6°F | Ht 64.0 in | Wt 204.6 lb

## 2015-11-21 DIAGNOSIS — F411 Generalized anxiety disorder: Secondary | ICD-10-CM

## 2015-11-21 DIAGNOSIS — F439 Reaction to severe stress, unspecified: Secondary | ICD-10-CM

## 2015-11-21 DIAGNOSIS — Z638 Other specified problems related to primary support group: Secondary | ICD-10-CM

## 2015-11-21 MED ORDER — ALPRAZOLAM 1 MG PO TABS: 1.0000 mg | ORAL_TABLET | Freq: Two times a day (BID) | ORAL | Status: DC | PRN

## 2015-11-21 NOTE — Patient Instructions (Signed)
Great to meet you!  PLease come back in 1 month  We can consider other medications in addition to what you rare taking to help your anxiety  Please consider counseling, If you are unable to afford it please consider talking to a trusted friend, family member, or pastor

## 2015-11-21 NOTE — Progress Notes (Addendum)
   HPI  Patient presents today here for follow-up of anxiety.  Patient is reporting increased anxiety since her sister's death about one month ago. She has increased her Xanax use to half a milligram in the morning, 1 mg in the afternoon, and 1-1/2 mg at night. She states that she did not take a pill this morning and says she feels very anxious and shaky.  He denies suicidal thoughts whenever I discussed with her.  She is still taking Lexapro as prescribed. BuSpar makes her sick so she is not taking this.  She is very upset that we did not refill the medication over the phone.  PMH: Smoking status noted ROS: Per HPI  Objective: BP 119/80 mmHg  Pulse 62  Temp(Src) 97.6 F (36.4 C) (Oral)  Ht 5\' 4"  (1.626 m)  Wt 204 lb 9.6 oz (92.806 kg)  BMI 35.10 kg/m2  LMP 10/17/2005 Gen: NAD, alert HEENT: NCAT CV: RRR, good S1/S2, no murmur Resp: CTABL, no wheezes, non-labored Ext: No edema, warm Neuro: Alert and oriented, No gross deficits Psych: Tearful as we began her discussion, anxious affect  Depression screen [facility] 2/9 11/21/2015 06/18/2015 04/18/2015 03/12/2015  Decreased Interest 2 0 1 0  Down, Depressed, Hopeless 2 0 1 0  PHQ - 2 Score 4 0 2 0  Altered sleeping 3 - 3 -  Tired, decreased energy 3 - 1 -  Change in appetite 3 - 0 -  Feeling bad or failure about yourself  2 - 0 -  Trouble concentrating 3 - 1 -  Moving slowly or fidgety/restless 1 - 0 -  Suicidal thoughts 0 - 0 -  PHQ-9 Score 19 - 7 -  Difficult doing work/chores - - Somewhat difficult -    GAD 7 : Generalized Anxiety Score 11/21/2015  Nervous, Anxious, on Edge 3  Control/stop worrying 3  Worry too much - different things 3  Trouble relaxing 3  Restless 3  Easily annoyed or irritable 3  Afraid - awful might happen 2  Total GAD 7 Score 20  Anxiety Difficulty Very difficult       Assessment and plan:  # Generalized anxiety disorder Long discussion with patient, she is having difficulty dealing with  her sister's recent death. I had a careful discussion about setting limits with benzodiazepine use. Continue Lexapro Decreased Xanax total daily dose to 2 mg, although the patient is not very happy with this I believe she understands the reasoning. I explained that we will not be able to refill it over the phone but I'll be glad to refill it when she returns next month. Consider adding adjunctive medication such as gabapentin, Lyrica, or Seroquel. These may help her sleep and reduce her Xanax need. I have recommended that she seek counseling, I understand she has financial limitations at this time so I encouraged her to consider talking to a trusted friend, family member, or pastor.  She denies suicidal thoughts Return to clinic in 3-4 weeks prior to refill need for Xanax.    Meds ordered this encounter  Medications  . ALPRAZolam (XANAX) 1 MG tablet    Sig: Take 1 tablet (1 mg total) by mouth 2 (two) times daily as needed for anxiety.    Dispense:  60 tablet    Refill:  0    Laroy Apple, MD [facility] 11/21/2015, 11:37 AM

## 2015-12-19 ENCOUNTER — Ambulatory Visit: Payer: Commercial Managed Care - HMO | Admitting: Family Medicine

## 2015-12-20 ENCOUNTER — Ambulatory Visit: Payer: Commercial Managed Care - HMO | Admitting: Nurse Practitioner

## 2015-12-20 ENCOUNTER — Encounter: Payer: Self-pay | Admitting: Nurse Practitioner

## 2015-12-20 ENCOUNTER — Ambulatory Visit (INDEPENDENT_AMBULATORY_CARE_PROVIDER_SITE_OTHER): Payer: Commercial Managed Care - HMO | Admitting: Nurse Practitioner

## 2015-12-20 VITALS — BP 119/65 | HR 67 | Temp 97.1°F | Ht 64.0 in | Wt 207.0 lb

## 2015-12-20 DIAGNOSIS — I2510 Atherosclerotic heart disease of native coronary artery without angina pectoris: Secondary | ICD-10-CM

## 2015-12-20 DIAGNOSIS — G2581 Restless legs syndrome: Secondary | ICD-10-CM

## 2015-12-20 DIAGNOSIS — J449 Chronic obstructive pulmonary disease, unspecified: Secondary | ICD-10-CM | POA: Diagnosis not present

## 2015-12-20 DIAGNOSIS — E559 Vitamin D deficiency, unspecified: Secondary | ICD-10-CM

## 2015-12-20 DIAGNOSIS — C50212 Malignant neoplasm of upper-inner quadrant of left female breast: Secondary | ICD-10-CM | POA: Diagnosis not present

## 2015-12-20 DIAGNOSIS — F411 Generalized anxiety disorder: Secondary | ICD-10-CM

## 2015-12-20 DIAGNOSIS — K1379 Other lesions of oral mucosa: Secondary | ICD-10-CM

## 2015-12-20 DIAGNOSIS — Z638 Other specified problems related to primary support group: Secondary | ICD-10-CM

## 2015-12-20 DIAGNOSIS — I213 ST elevation (STEMI) myocardial infarction of unspecified site: Secondary | ICD-10-CM | POA: Diagnosis not present

## 2015-12-20 DIAGNOSIS — E119 Type 2 diabetes mellitus without complications: Secondary | ICD-10-CM | POA: Diagnosis not present

## 2015-12-20 DIAGNOSIS — E785 Hyperlipidemia, unspecified: Secondary | ICD-10-CM

## 2015-12-20 DIAGNOSIS — I2583 Coronary atherosclerosis due to lipid rich plaque: Secondary | ICD-10-CM

## 2015-12-20 DIAGNOSIS — I10 Essential (primary) hypertension: Secondary | ICD-10-CM

## 2015-12-20 DIAGNOSIS — F439 Reaction to severe stress, unspecified: Secondary | ICD-10-CM

## 2015-12-20 LAB — BAYER DCA HB A1C WAIVED: HB A1C: 6.3 % (ref <7.0)

## 2015-12-20 MED ORDER — FLUTICASONE FUROATE-VILANTEROL 100-25 MCG/INH IN AEPB: 1.0000 | INHALATION_SPRAY | Freq: Every day | RESPIRATORY_TRACT | Status: DC

## 2015-12-20 MED ORDER — ALPRAZOLAM 1 MG PO TABS: 1.0000 mg | ORAL_TABLET | Freq: Two times a day (BID) | ORAL | Status: DC | PRN

## 2015-12-20 MED ORDER — ISOSORBIDE MONONITRATE ER 30 MG PO TB24: 15.0000 mg | ORAL_TABLET | Freq: Every day | ORAL | Status: DC

## 2015-12-20 NOTE — Patient Instructions (Signed)
Diabetes and Foot Care Diabetes may cause you to have problems because of poor blood supply (circulation) to your feet and legs. This may cause the skin on your feet to become thinner, break easier, and heal more slowly. Your skin may become dry, and the skin may peel and crack. You may also have nerve damage in your legs and feet causing decreased feeling in them. You may not notice minor injuries to your feet that could lead to infections or more serious problems. Taking care of your feet is one of the most important things you can do for yourself.  HOME CARE INSTRUCTIONS  Wear shoes at all times, even in the house. Do not go barefoot. Bare feet are easily injured.  Check your feet daily for blisters, cuts, and redness. If you cannot see the bottom of your feet, use a mirror or ask someone for help.  Wash your feet with warm water (do not use hot water) and mild soap. Then pat your feet and the areas between your toes until they are completely dry. Do not soak your feet as this can dry your skin.  Apply a moisturizing lotion or petroleum jelly (that does not contain alcohol and is unscented) to the skin on your feet and to dry, brittle toenails. Do not apply lotion between your toes.  Trim your toenails straight across. Do not dig under them or around the cuticle. File the edges of your nails with an emery board or nail file.  Do not cut corns or calluses or try to remove them with medicine.  Wear clean socks or stockings every day. Make sure they are not too tight. Do not wear knee-high stockings since they may decrease blood flow to your legs.  Wear shoes that fit properly and have enough cushioning. To break in new shoes, wear them for just a few hours a day. This prevents you from injuring your feet. Always look in your shoes before you put them on to be sure there are no objects inside.  Do not cross your legs. This may decrease the blood flow to your feet.  If you find a minor scrape,  cut, or break in the skin on your feet, keep it and the skin around it clean and dry. These areas may be cleansed with mild soap and water. Do not cleanse the area with peroxide, alcohol, or iodine.  When you remove an adhesive bandage, be sure not to damage the skin around it.  If you have a wound, look at it several times a day to make sure it is healing.  Do not use heating pads or hot water bottles. They may burn your skin. If you have lost feeling in your feet or legs, you may not know it is happening until it is too late.  Make sure your health care provider performs a complete foot exam at least annually or more often if you have foot problems. Report any cuts, sores, or bruises to your health care provider immediately. SEEK MEDICAL CARE IF:   You have an injury that is not healing.  You have cuts or breaks in the skin.  You have an ingrown nail.  You notice redness on your legs or feet.  You feel burning or tingling in your legs or feet.  You have pain or cramps in your legs and feet.  Your legs or feet are numb.  Your feet always feel cold. SEEK IMMEDIATE MEDICAL CARE IF:   There is increasing redness,   swelling, or pain in or around a wound.  There is a red line that goes up your leg.  Pus is coming from a wound.  You develop a fever or as directed by your health care provider.  You notice a bad smell coming from an ulcer or wound.   This information is not intended to replace advice given to you by your health care provider. Make sure you discuss any questions you have with your health care provider.   Document Released: 07/10/2000 Document Revised: 03/15/2013 Document Reviewed: 12/20/2012 Elsevier Interactive Patient Education 2016 [employer]  

## 2015-12-20 NOTE — Progress Notes (Signed)
Subjective:    Patient ID: [name], female    DOB: [birth year], 60 y.o.   MRN: [id number]  Patient here today for follow up of chronic medical problems.  Outpatient Encounter Prescriptions as of 12/20/2015  Medication Sig  . ALPRAZolam (XANAX) 1 MG tablet Take 1 tablet (1 mg total) by mouth 2 (two) times daily as needed for anxiety.  [name] aspirin 325 MG tablet Take 325 mg by mouth daily.    [name] atorvastatin (LIPITOR) 80 MG tablet Take 1 tablet (80 mg total) by mouth daily.  . blood glucose meter kit and supplies KIT Dispense based on pt and ins preference. Use up to QDas directed. (FOR ICD-10 E11.9).  [name] Cholecalciferol (VITAMIN D3) 3000 UNITS TABS Take 1 tablet by mouth daily.  [name] escitalopram (LEXAPRO) 20 MG tablet Take 1 tablet (20 mg total) by mouth daily.  . fish oil-omega-3 fatty acids 1000 MG capsule Take 2 g by mouth 2 (two) times daily.   . Fluticasone Furoate-Vilanterol (BREO ELLIPTA) 100-25 MCG/INH AEPB Inhale 1 puff into the lungs daily.  . isosorbide mononitrate (IMDUR) 30 MG 24 hr tablet Take 0.5 tablets (15 mg total) by mouth daily.  [name] lisinopril (PRINIVIL,ZESTRIL) 5 MG tablet Take 1 tablet (5 mg total) by mouth daily.  . metFORMIN (GLUCOPHAGE) 500 MG tablet Take 1 tablet (500 mg total) by mouth 2 (two) times daily with a meal.  . metoprolol tartrate (LOPRESSOR) 25 MG tablet Take 1 tablet (25 mg total) by mouth 2 (two) times daily.  . pramipexole (MIRAPEX) 0.5 MG tablet Take 1-2 tablets (0.5-1 mg total) by mouth at bedtime as needed.  . vitamin B-12 (CYANOCOBALAMIN) 1000 MCG tablet Take 1,000 mcg by mouth daily.  . [DISCONTINUED] busPIRone (BUSPAR) 5 MG tablet Take 2 tablets (10 mg total) by mouth 2 (two) times daily.   No facility-administered encounter medications on file as of 12/20/2015.     Hypertension This is a chronic problem. The current episode started more than 1 year ago. The problem is controlled. Pertinent negatives include no blurred vision, chest pain,  malaise/fatigue, palpitations, peripheral edema or shortness of breath. There are no associated agents to hypertension. Risk factors for coronary artery disease include dyslipidemia, diabetes mellitus, obesity, post-menopausal state and sedentary lifestyle. Past treatments include ACE inhibitors and beta blockers. The current treatment provides significant improvement. Compliance problems include diet and exercise.  Hypertensive end-organ damage includes CAD/MI and PVD. There is no history of CVA or retinopathy.  Hyperlipidemia This is a chronic problem. The current episode started more than 1 year ago. Recent lipid tests were reviewed and are variable. Exacerbating diseases include diabetes and obesity. She has no history of hypothyroidism. Pertinent negatives include no chest pain or shortness of breath. Current antihyperlipidemic treatment includes statins. The current treatment provides moderate improvement of lipids. Compliance problems include adherence to diet and adherence to exercise.  Risk factors for coronary artery disease include diabetes mellitus, hypertension, obesity and post-menopausal.  Diabetes She presents for her follow-up diabetic visit. She has type 2 diabetes mellitus. No MedicAlert identification noted. Her disease course has been stable. Hypoglycemia symptoms include nervousness/anxiousness. Pertinent negatives for diabetes include no blurred vision and no chest pain. There are no hypoglycemic complications. Diabetic complications include PVD. Pertinent negatives for diabetic complications include no CVA, nephropathy or retinopathy. Risk factors for coronary artery disease include dyslipidemia, hypertension, obesity and post-menopausal. Current diabetic treatment includes oral agent (monotherapy). She is compliant with treatment most of the time. Her weight is  stable. When asked about meal planning, she reported none. She has not had a previous visit with a dietitian. She rarely  participates in exercise. Home blood sugar record trend: patient does not check blood sugars at home. An ACE inhibitor/angiotensin II receptor blocker is being taken. She does not see a podiatrist.Eye exam is not current.  CAD/hx MI Patient currently on imdur- sees cardiologist every 6 months- no recent chest pain COPD On BREO daily- no recent need for rescuer inhaler- no wheezing or SOB Hx of breast cancer-  Released by oncologist- no complications GAD Takes lexapro daily- no side effects- helps to keep her calm. But has had some wrsening since s ehad to do CPR on her sister who eventually passed away- she is on xanax 30m BID and says that she can not manage right now without taking- she talked with DR. VEvette Doffingabout it and will wean off in a few months, once she can get through things. GAD 7 : Generalized Anxiety Score 12/20/2015 11/21/2015  Nervous, Anxious, on Edge 3 3  Control/stop worrying 3 3  Worry too much - different things 2 3  Trouble relaxing 3 3  Restless 2 3  Easily annoyed or irritable 3 3  Afraid - awful might happen 3 2  Total GAD 7 Score 19 20  Anxiety Difficulty Very difficult Very difficult  RLS miapex nightly works well- has occasional symptoms but is overall doing well. Vitamin D Forgets to take vitamin d -has not had in awhile     Review of Systems  Constitutional: Negative.  Negative for malaise/fatigue.  HENT: Negative.   Eyes: Negative for blurred vision.  Respiratory: Negative for shortness of breath.   Cardiovascular: Negative for chest pain, palpitations and leg swelling.  Gastrointestinal: Negative.   Genitourinary: Negative.   Neurological: Negative.   Psychiatric/Behavioral: The patient is nervous/anxious.   All other systems reviewed and are negative.      Objective:   Physical Exam  Constitutional: She is oriented to person, place, and time. She appears well-developed and well-nourished.  HENT:  Nose: Nose normal.  Mouth/Throat:  Oropharynx is clear and moist.  Eyes: EOM are normal.  Neck: Trachea normal, normal range of motion and full passive range of motion without pain. Neck supple. No JVD present. Carotid bruit is not present. No thyromegaly present.  Cardiovascular: Normal rate, regular rhythm, normal heart sounds and intact distal pulses.  Exam reveals no gallop and no friction rub.   No murmur heard. Pulmonary/Chest: Effort normal and breath sounds normal.  Abdominal: Soft. Bowel sounds are normal. She exhibits no distension and no mass. There is no tenderness.  Musculoskeletal: Normal range of motion.  Lymphadenopathy:    She has no cervical adenopathy.  Neurological: She is alert and oriented to person, place, and time. She has normal reflexes.  Skin: Skin is warm and dry.  Soft tissue lesion nontender right cheek   Psychiatric: Her behavior is normal. Judgment and thought content normal.  Tearful during exam   BP 119/65 mmHg  Pulse 67  Temp(Src) 97.1 F (36.2 C) (Oral)  Ht 5' 4"  (1.626 m)  Wt 207 lb (93.895 kg)  BMI 35.51 kg/m2  LMP 10/17/2005        Assessment & Plan:  1. Essential hypertension Do not add salt to diet - CMP14+EGFR  2. Coronary artery disease due to lipid rich plaque Keep follow up cardiology  3. ST elevation myocardial infarction (STEMI), unspecified artery ([place]  4. Chronic obstructive pulmonary disease,  unspecified COPD type ([place])  5. Type 2 diabetes mellitus without complication, without long-term current use of insulin (HCC) Continue to watch carbs in diet - Bayer DCA Hb A1c Waived  6. Hyperlipidemia Low fat diet - Lipid panel  7. Malignant neoplasm of upper-inner quadrant of left female breast ([place])   8. Generalized anxiety disorder Stress management  9. Restless leg syndrome Continue mirapex Keep legs warm at night  10. Vitamin D deficiency  11. Morbid obesity, unspecified obesity type ([place]) Discussed diet and exercise for person with BMI  >25 Will recheck weight in 3-6 months  12. Nodule of cheek Continue to watch until gets appointment with dermatologist - Ambulatory referral to Dermatology  13. Atherosclerosis of native coronary artery without angina pectoris, unspecified whether native or transplanted heart - isosorbide mononitrate (IMDUR) 30 MG 24 hr tablet; Take 0.5 tablets (15 mg total) by mouth daily.  Dispense: 90 tablet; Refill: 2  14. Chronic obstructive pulmonary disease, unspecified COPD, unspecified chronic bronchitis type - fluticasone furoate-vilanterol (BREO ELLIPTA) 100-25 MCG/INH AEPB; Inhale 1 puff into the lungs daily.  Dispense: 60 each; Refill: 3  15. Stress at home - ALPRAZolam (XANAX) 1 MG tablet; Take 1 tablet (1 mg total) by mouth 2 (two) times daily as needed for anxiety.  Dispense: 60 tablet; Refill: 0   Patient will schedule eye exam Labs pending Health maintenance reviewed Diet and exercise encouraged Continue all meds Follow up  In 3 months   [place], FNP

## 2015-12-21 LAB — CMP14+EGFR
ALBUMIN: 4.3 g/dL (ref 3.5–5.5)
ALT: 13 IU/L (ref 0–32)
AST: 10 IU/L (ref 0–40)
Albumin/Globulin Ratio: 1.8 (ref 1.2–2.2)
Alkaline Phosphatase: 93 IU/L (ref 39–117)
BUN/Creatinine Ratio: 14 (ref 9–23)
BUN: 13 mg/dL (ref 6–24)
Bilirubin Total: 0.4 mg/dL (ref 0.0–1.2)
CALCIUM: 9.8 mg/dL (ref 8.7–10.2)
CO2: 21 mmol/L (ref 18–29)
Chloride: 103 mmol/L (ref 96–106)
Creatinine, Ser: 0.92 mg/dL (ref 0.57–1.00)
GFR, EST AFRICAN AMERICAN: 79 mL/min/{1.73_m2} (ref >59)
GFR, EST NON AFRICAN AMERICAN: 68 mL/min/{1.73_m2} (ref >59)
GLUCOSE: 77 mg/dL (ref 65–99)
Globulin, Total: 2.4 g/dL (ref 1.5–4.5)
Potassium: 4.3 mmol/L (ref 3.5–5.2)
Sodium: 141 mmol/L (ref 134–144)
TOTAL PROTEIN: 6.7 g/dL (ref 6.0–8.5)

## 2015-12-21 LAB — LIPID PANEL
CHOLESTEROL TOTAL: 137 mg/dL (ref 100–199)
Chol/HDL Ratio: 4 ratio units (ref 0.0–4.4)
HDL: 34 mg/dL — AB (ref >39)
LDL Calculated: 72 mg/dL (ref 0–99)
TRIGLYCERIDES: 153 mg/dL — AB (ref 0–149)
VLDL CHOLESTEROL CAL: 31 mg/dL (ref 5–40)

## 2016-01-19 ENCOUNTER — Other Ambulatory Visit: Payer: Self-pay | Admitting: Nurse Practitioner

## 2016-01-20 NOTE — Telephone Encounter (Signed)
Last seen and filled 12/20/15. Call in at [facility]

## 2016-01-20 NOTE — Telephone Encounter (Signed)
Please call in xanax with 1 refills 

## 2016-01-21 ENCOUNTER — Telehealth: Payer: Self-pay | Admitting: Pediatrics

## 2016-01-21 DIAGNOSIS — L821 Other seborrheic keratosis: Secondary | ICD-10-CM | POA: Diagnosis not present

## 2016-01-21 DIAGNOSIS — L723 Sebaceous cyst: Secondary | ICD-10-CM | POA: Diagnosis not present

## 2016-01-21 DIAGNOSIS — D239 Other benign neoplasm of skin, unspecified: Secondary | ICD-10-CM | POA: Diagnosis not present

## 2016-01-21 NOTE — Telephone Encounter (Signed)
Patient aware rx called into pharmacy.

## 2016-01-21 NOTE — Telephone Encounter (Signed)
rx called into pharmacy

## 2016-03-19 ENCOUNTER — Other Ambulatory Visit: Payer: Self-pay | Admitting: Nurse Practitioner

## 2016-03-20 NOTE — Telephone Encounter (Signed)
Last filled 02/20/16, last seen 12/20/15 call in

## 2016-03-20 NOTE — Telephone Encounter (Signed)
Please call in xanax with 1 refills 

## 2016-03-21 ENCOUNTER — Telehealth: Payer: Self-pay | Admitting: *Deleted

## 2016-03-21 NOTE — Telephone Encounter (Signed)
RX for Xanax called into [facility] per pt request Okayed per MMM Pt notified

## 2016-03-24 ENCOUNTER — Ambulatory Visit: Payer: Commercial Managed Care - HMO | Admitting: Pediatrics

## 2016-03-26 ENCOUNTER — Ambulatory Visit: Payer: Commercial Managed Care - HMO | Admitting: Pediatrics

## 2016-04-01 ENCOUNTER — Other Ambulatory Visit: Payer: Self-pay | Admitting: Family

## 2016-04-01 ENCOUNTER — Other Ambulatory Visit: Payer: Self-pay | Admitting: Pediatrics

## 2016-04-01 ENCOUNTER — Ambulatory Visit: Payer: Commercial Managed Care - HMO | Admitting: Pediatrics

## 2016-04-01 DIAGNOSIS — E785 Hyperlipidemia, unspecified: Secondary | ICD-10-CM

## 2016-04-01 DIAGNOSIS — I10 Essential (primary) hypertension: Secondary | ICD-10-CM

## 2016-04-01 DIAGNOSIS — F411 Generalized anxiety disorder: Secondary | ICD-10-CM

## 2016-04-01 MED ORDER — ESCITALOPRAM OXALATE 20 MG PO TABS: 20.0000 mg | ORAL_TABLET | Freq: Every day | ORAL | 0 refills | Status: DC

## 2016-04-01 MED ORDER — ATORVASTATIN CALCIUM 80 MG PO TABS: 80.0000 mg | ORAL_TABLET | Freq: Every day | ORAL | 0 refills | Status: DC

## 2016-04-01 MED ORDER — METFORMIN HCL 500 MG PO TABS: 500.0000 mg | ORAL_TABLET | Freq: Two times a day (BID) | ORAL | 0 refills | Status: DC

## 2016-04-01 MED ORDER — LISINOPRIL 5 MG PO TABS: 5.0000 mg | ORAL_TABLET | Freq: Every day | ORAL | 0 refills | Status: DC

## 2016-04-01 NOTE — Telephone Encounter (Signed)
Pt notified of refills

## 2016-04-03 ENCOUNTER — Ambulatory Visit: Payer: Commercial Managed Care - HMO | Admitting: Pediatrics

## 2016-04-06 ENCOUNTER — Encounter: Payer: Self-pay | Admitting: Pediatrics

## 2016-04-08 ENCOUNTER — Ambulatory Visit (INDEPENDENT_AMBULATORY_CARE_PROVIDER_SITE_OTHER): Payer: Commercial Managed Care - HMO | Admitting: Pediatrics

## 2016-04-08 ENCOUNTER — Encounter: Payer: Self-pay | Admitting: Pediatrics

## 2016-04-08 VITALS — BP 137/72 | HR 63 | Temp 97.4°F | Ht 64.0 in | Wt 207.6 lb

## 2016-04-08 DIAGNOSIS — E1159 Type 2 diabetes mellitus with other circulatory complications: Secondary | ICD-10-CM | POA: Diagnosis not present

## 2016-04-08 DIAGNOSIS — F32A Depression, unspecified: Secondary | ICD-10-CM | POA: Insufficient documentation

## 2016-04-08 DIAGNOSIS — J449 Chronic obstructive pulmonary disease, unspecified: Secondary | ICD-10-CM | POA: Diagnosis not present

## 2016-04-08 DIAGNOSIS — I10 Essential (primary) hypertension: Secondary | ICD-10-CM | POA: Diagnosis not present

## 2016-04-08 DIAGNOSIS — F411 Generalized anxiety disorder: Secondary | ICD-10-CM | POA: Diagnosis not present

## 2016-04-08 DIAGNOSIS — F329 Major depressive disorder, single episode, unspecified: Secondary | ICD-10-CM | POA: Diagnosis not present

## 2016-04-08 MED ORDER — BUPROPION HCL ER (SR) 150 MG PO TB12: 150.0000 mg | ORAL_TABLET | Freq: Two times a day (BID) | ORAL | 2 refills | Status: DC

## 2016-04-08 MED ORDER — ALPRAZOLAM 1 MG PO TABS: 1.0000 mg | ORAL_TABLET | Freq: Two times a day (BID) | ORAL | 2 refills | Status: DC | PRN

## 2016-04-08 NOTE — Patient Instructions (Addendum)
Take 150mg  bupropion once a day for 3 days, then increase to twice a day

## 2016-04-08 NOTE — Progress Notes (Signed)
  Subjective:   Patient ID: [name], female    DOB: [birth year], 60 y.o.   MRN: [id number] CC: Follow-up (3 month)  HPI: [name] is a 60 y.o. female presenting for Follow-up (3 month)  DM2: Takes metformin regularly  depression: feels like she isnt accomplishing anything Sister died 6 mo ago, still hasnt started paperwork Crying spells Spending time with her friend and friend's kids help some  Feels like she is getting more anxious Avoiding people Doesn't like being around people Taking xanax regularly Says she gets anxious toward end of month every month about whether or not she will have enough xanax left Taking 1/2 to 1 tab BID prn  Smoking regularly, not interested in quitting now  Relevant past medical, surgical, family and social history reviewed. Allergies and medications reviewed and updated. History  Smoking Status  . Current Every Day Smoker  . Packs/day: 1.00  . Years: 35.00  . Types: Cigarettes  Smokeless Tobacco  . Never Used   ROS: Per HPI   Objective:    BP 137/72   Pulse 63   Temp 97.4 F (36.3 C) (Oral)   Ht 5\' 4"  (1.626 m)   Wt 207 lb 9.6 oz (94.2 kg)   LMP 10/17/2005   BMI 35.63 kg/m   Wt Readings from Last 3 Encounters:  04/08/16 207 lb 9.6 oz (94.2 kg)  12/20/15 207 lb (93.9 kg)  11/21/15 204 lb 9.6 oz (92.8 kg)    Gen: NAD, alert, cooperative with exam, NCAT EYES: EOMI, no conjunctival injection, or no icterus ENT:  OP without erythema LYMPH: no cervical LAD CV: NRRR, normal S1/S2, no murmur, distal pulses 2+ b/l Resp: CTABL, no wheezes, normal WOB Ext: No edema, warm Neuro: Alert and oriented, strength equal b/l UE and LE, coordination grossly normal Psych: tearful at times, full affect, no thoughts of self harm  Assessment & Plan:  [name] was seen today for follow-up multiple med problems  Diagnoses and all orders for this visit:  Type 2 diabetes mellitus with other circulatory complication, without long-term  current use of insulin (HCC) Cont metformin a1c 6.3 last visit Recheck next visit  Essential hypertension Adequate control, cont current meds  Chronic obstructive pulmonary disease, unspecified COPD type (HCC) Cont breo, breathing has been well controlled  Generalized anxiety disorder Pt wanting increase in xanax so she doesn't worry about running out Have discussed w pt in past, goal is to stop xanax eventually, not ideal medication for anxiety esp with COPD Pt not interested in decreasing now Still grieving for sister, lots of stressors at home, money, family related Take xanax 1mg  BID. No more than 1mg  BID. Gave list of counselors, pt wants to get back into counseling -     ALPRAZolam (XANAX) 1 MG tablet; Take 1 tablet (1 mg total) by mouth 2 (two) times daily as needed.  Depression Crying spells Cont lexapro, add below Switch to different SSRI if still with symptom next visit -     buPROPion (WELLBUTRIN SR) 150 MG 12 hr tablet; Take 1 tablet (150 mg total) by mouth 2 (two) times daily.   Follow up plan: Return in about 2 months (around 06/08/2016). Assunta Found, MD [place]

## 2016-05-26 ENCOUNTER — Other Ambulatory Visit: Payer: Self-pay | Admitting: Nurse Practitioner

## 2016-05-26 DIAGNOSIS — I10 Essential (primary) hypertension: Secondary | ICD-10-CM

## 2016-05-26 DIAGNOSIS — E785 Hyperlipidemia, unspecified: Secondary | ICD-10-CM

## 2016-06-08 ENCOUNTER — Ambulatory Visit: Payer: Commercial Managed Care - HMO | Admitting: Pediatrics

## 2016-06-11 ENCOUNTER — Other Ambulatory Visit: Payer: Self-pay | Admitting: Nurse Practitioner

## 2016-06-11 DIAGNOSIS — F411 Generalized anxiety disorder: Secondary | ICD-10-CM

## 2016-06-15 ENCOUNTER — Ambulatory Visit: Payer: Commercial Managed Care - HMO | Admitting: Pediatrics

## 2016-06-22 ENCOUNTER — Ambulatory Visit: Payer: Commercial Managed Care - HMO | Admitting: Pediatrics

## 2016-06-25 ENCOUNTER — Encounter: Payer: Self-pay | Admitting: Pediatrics

## 2016-06-25 ENCOUNTER — Ambulatory Visit (INDEPENDENT_AMBULATORY_CARE_PROVIDER_SITE_OTHER): Payer: Commercial Managed Care - HMO | Admitting: Pediatrics

## 2016-06-25 VITALS — BP 126/68 | HR 62 | Temp 98.1°F | Ht 64.0 in | Wt 206.8 lb

## 2016-06-25 DIAGNOSIS — J449 Chronic obstructive pulmonary disease, unspecified: Secondary | ICD-10-CM | POA: Diagnosis not present

## 2016-06-25 DIAGNOSIS — F411 Generalized anxiety disorder: Secondary | ICD-10-CM

## 2016-06-25 DIAGNOSIS — G4700 Insomnia, unspecified: Secondary | ICD-10-CM

## 2016-06-25 DIAGNOSIS — I10 Essential (primary) hypertension: Secondary | ICD-10-CM

## 2016-06-25 DIAGNOSIS — I2510 Atherosclerotic heart disease of native coronary artery without angina pectoris: Secondary | ICD-10-CM | POA: Diagnosis not present

## 2016-06-25 DIAGNOSIS — E785 Hyperlipidemia, unspecified: Secondary | ICD-10-CM

## 2016-06-25 DIAGNOSIS — E1159 Type 2 diabetes mellitus with other circulatory complications: Secondary | ICD-10-CM

## 2016-06-25 LAB — BAYER DCA HB A1C WAIVED: HB A1C (BAYER DCA - WAIVED): 5.8 % (ref <7.0)

## 2016-06-25 MED ORDER — METOPROLOL TARTRATE 25 MG PO TABS: 25.0000 mg | ORAL_TABLET | Freq: Two times a day (BID) | ORAL | 1 refills | Status: DC

## 2016-06-25 MED ORDER — METFORMIN HCL 500 MG PO TABS: 500.0000 mg | ORAL_TABLET | Freq: Two times a day (BID) | ORAL | 1 refills | Status: DC

## 2016-06-25 MED ORDER — ATORVASTATIN CALCIUM 80 MG PO TABS: 80.0000 mg | ORAL_TABLET | Freq: Every day | ORAL | 1 refills | Status: DC

## 2016-06-25 MED ORDER — LISINOPRIL 5 MG PO TABS: 5.0000 mg | ORAL_TABLET | Freq: Every day | ORAL | 1 refills | Status: DC

## 2016-06-25 MED ORDER — ISOSORBIDE MONONITRATE ER 30 MG PO TB24: 15.0000 mg | ORAL_TABLET | Freq: Every day | ORAL | 2 refills | Status: DC

## 2016-06-25 MED ORDER — ESCITALOPRAM OXALATE 20 MG PO TABS: 20.0000 mg | ORAL_TABLET | Freq: Every day | ORAL | 1 refills | Status: DC

## 2016-06-25 MED ORDER — ALPRAZOLAM 1 MG PO TABS: 1.0000 mg | ORAL_TABLET | Freq: Two times a day (BID) | ORAL | 2 refills | Status: DC | PRN

## 2016-06-25 MED ORDER — DOXEPIN HCL 10 MG PO CAPS: 10.0000 mg | ORAL_CAPSULE | Freq: Every evening | ORAL | 3 refills | Status: DC | PRN

## 2016-06-25 NOTE — Progress Notes (Signed)
Subjective:   Patient ID: [name], female    DOB: 1955-10-18, 60 y.o.   MRN: [id number] CC: Follow-up (2 month)  HPI: [name] is a 60 y.o. female presenting for Follow-up (2 month)  Breathing in the last few weeks has been ok Lots of stress with housing, might foreclose on house, relatives trying buy house, pt doesn't want to sell it to them Sister died recenty, still grieving Taking xanax BID Does help with nerves Rarely tries to take just half a tab Continues to smoke daily, not ready to quit, remains interested in it, says too much going on for her nerves for now Still havign trouble sleeping, now because not able to turn off thoughts and worries Takes lexapro daily Does think it helps some Feels safe at home No thoughts of self harm No chest pain SOB at baseline, worse when she gets anxious  Relevant past medical, surgical, family and social history reviewed. Allergies and medications reviewed and updated. History  Smoking Status  . Current Every Day Smoker  . Packs/day: 1.00  . Years: 35.00  . Types: Cigarettes  Smokeless Tobacco  . Never Used   ROS: Per HPI   Objective:    BP 126/68   Pulse 62   Temp 98.1 F (36.7 C) (Oral)   Ht 5\' 4"  (1.626 m)   Wt 206 lb 12.8 oz (93.8 kg)   LMP 10/17/2005   BMI 35.50 kg/m   Wt Readings from Last 3 Encounters:  06/25/16 206 lb 12.8 oz (93.8 kg)  04/08/16 207 lb 9.6 oz (94.2 kg)  12/20/15 207 lb (93.9 kg)    Gen: NAD, alert, cooperative with exam, NCAT EYES: EOMI, no conjunctival injection, or no icterus CV: NRRR, normal S1/S2 Resp: moving air fair, CTAB, no wheezes, normal WOB Abd: +BS, soft, NTND. no guarding or organomegaly Ext: No edema, warm Neuro: Alert and oriented, strength equal b/l UE and LE, coordination grossly normal MSK: normal muscle bulk Psych: nl affect, mood is down  Assessment & Plan:  Charissa was seen today for follow-up multiple med problems  Diagnoses and all orders for this  visit:  Essential hypertension Well controlled, cont current meds -     lisinopril (PRINIVIL,ZESTRIL) 5 MG tablet; Take 1 tablet (5 mg total) by mouth daily. -     metoprolol tartrate (LOPRESSOR) 25 MG tablet; Take 1 tablet (25 mg total) by mouth 2 (two) times daily.  Generalized anxiety disorder Continues to have symptoms Xanax does help with symptoms Continue to d/w pt goal to decrease xanax With ongoing home stressors will not change dosing today Cont lexapro RTC 3 months -     ALPRAZolam (XANAX) 1 MG tablet; Take 1 tablet (1 mg total) by mouth 2 (two) times daily as needed. -     escitalopram (LEXAPRO) 20 MG tablet; Take 1 tablet (20 mg total) by mouth daily.  Chronic obstructive pulmonary disease, unspecified COPD type ([place]) stable  Type 2 diabetes mellitus with other circulatory complication, without long-term current use of insulin (HCC) A1c less than 7, cont metformin -     Bayer DCA Hb A1c Waived -     metFORMIN (GLUCOPHAGE) 500 MG tablet; Take 1 tablet (500 mg total) by mouth 2 (two) times daily with a meal.  Hyperlipidemia, unspecified hyperlipidemia type Cont below -     atorvastatin (LIPITOR) 80 MG tablet; Take 1 tablet (80 mg total) by mouth daily.  Atherosclerosis of native coronary artery without angina pectoris, unspecified whether  native or transplanted heart No recent chest pain -     isosorbide mononitrate (IMDUR) 30 MG 24 hr tablet; Take 0.5 tablets (15 mg total) by mouth daily.  Insomnia, unspecified type Xanax helps relax her at night, still has trouble sleeping though With ongoing anxiety, will do trial of low dose doxepin at night -     doxepin (SINEQUAN) 10 MG capsule; Take 1 capsule (10 mg total) by mouth at bedtime as needed.   Follow up plan: Return in about 3 months (around 09/23/2016) for med follow up. Assunta Found, MD [place]

## 2016-06-30 DIAGNOSIS — H5213 Myopia, bilateral: Secondary | ICD-10-CM | POA: Diagnosis not present

## 2016-06-30 DIAGNOSIS — E119 Type 2 diabetes mellitus without complications: Secondary | ICD-10-CM | POA: Diagnosis not present

## 2016-06-30 LAB — HM DIABETES EYE EXAM

## 2016-07-17 DIAGNOSIS — H52209 Unspecified astigmatism, unspecified eye: Secondary | ICD-10-CM | POA: Diagnosis not present

## 2016-07-17 DIAGNOSIS — H5213 Myopia, bilateral: Secondary | ICD-10-CM | POA: Diagnosis not present

## 2016-07-17 DIAGNOSIS — H524 Presbyopia: Secondary | ICD-10-CM | POA: Diagnosis not present

## 2016-07-22 ENCOUNTER — Other Ambulatory Visit: Payer: Self-pay | Admitting: Family

## 2016-07-22 ENCOUNTER — Ambulatory Visit: Payer: Commercial Managed Care - HMO | Admitting: Pharmacist

## 2016-07-31 ENCOUNTER — Encounter: Payer: Self-pay | Admitting: Family Medicine

## 2016-07-31 ENCOUNTER — Ambulatory Visit (INDEPENDENT_AMBULATORY_CARE_PROVIDER_SITE_OTHER): Payer: Commercial Managed Care - HMO | Admitting: Family Medicine

## 2016-07-31 VITALS — BP 129/71 | HR 73 | Temp 97.6°F | Ht 64.0 in | Wt 206.1 lb

## 2016-07-31 DIAGNOSIS — J0110 Acute frontal sinusitis, unspecified: Secondary | ICD-10-CM | POA: Diagnosis not present

## 2016-07-31 DIAGNOSIS — Z0289 Encounter for other administrative examinations: Secondary | ICD-10-CM

## 2016-07-31 MED ORDER — PREDNISONE 20 MG PO TABS: ORAL_TABLET | ORAL | 0 refills | Status: DC

## 2016-07-31 MED ORDER — AZITHROMYCIN 250 MG PO TABS: ORAL_TABLET | ORAL | 0 refills | Status: DC

## 2016-07-31 NOTE — Progress Notes (Signed)
BP 129/71   Pulse 73   Temp 97.6 F (36.4 C) (Oral)   Ht 5\' 4"  (1.626 m)   Wt 206 lb 2 oz (93.5 kg)   LMP 10/17/2005   BMI 35.38 kg/m    Subjective:    Patient ID: [name], female    DOB: Sep 16, 1955, 61 y.o.   MRN: [id number]  HPI: [name] is a 61 y.o. female presenting on 07/31/2016 for Sinusitis (sinus congestion & pressure; symptoms began 3 days ago) and Right ear pain (x 2 days)   HPI Sinus congestion and ear pressure Patient has been having sinus congestion and ear pressure that is been going on for 3 days but the ear pain really worsened overnight last night. She has been having some cough and nasal drainage and frontal sinus pressure and postnasal drainage that is been going on during this time as well. She denies any fevers or chills or wheezing or shortness of breath. She denies any sick contacts that she knows of but the cold weather she feels may be playing a factor on it. She used some Aleve and Benadryl last night to try and help with this which helped some but not significantly.  Relevant past medical, surgical, family and social history reviewed and updated as indicated. Interim medical history since our last visit reviewed. Allergies and medications reviewed and updated.  Review of Systems  Constitutional: Negative for chills and fever.  HENT: Positive for congestion, postnasal drip, rhinorrhea, sinus pressure and sore throat. Negative for ear discharge, ear pain and sneezing.   Eyes: Negative for pain, redness and visual disturbance.  Respiratory: Positive for cough. Negative for chest tightness, shortness of breath and wheezing.   Cardiovascular: Negative for chest pain and leg swelling.  Genitourinary: Negative for difficulty urinating and dysuria.  Musculoskeletal: Negative for back pain and gait problem.  Skin: Negative for rash.  Neurological: Negative for light-headedness and headaches.  Psychiatric/Behavioral: Negative for agitation and  behavioral problems.  All other systems reviewed and are negative.   Per HPI unless specifically indicated above     Objective:    BP 129/71   Pulse 73   Temp 97.6 F (36.4 C) (Oral)   Ht 5\' 4"  (1.626 m)   Wt 206 lb 2 oz (93.5 kg)   LMP 10/17/2005   BMI 35.38 kg/m   Wt Readings from Last 3 Encounters:  07/31/16 206 lb 2 oz (93.5 kg)  06/25/16 206 lb 12.8 oz (93.8 kg)  04/08/16 207 lb 9.6 oz (94.2 kg)    Physical Exam  Constitutional: She is oriented to person, place, and time. She appears well-developed and well-nourished. No distress.  HENT:  Right Ear: Tympanic membrane, external ear and ear canal normal.  Left Ear: Tympanic membrane, external ear and ear canal normal.  Nose: Mucosal edema and rhinorrhea present. No epistaxis. Right sinus exhibits frontal sinus tenderness. Right sinus exhibits no maxillary sinus tenderness. Left sinus exhibits frontal sinus tenderness. Left sinus exhibits no maxillary sinus tenderness.  Mouth/Throat: Uvula is midline and mucous membranes are normal. Posterior oropharyngeal edema and posterior oropharyngeal erythema present. No oropharyngeal exudate or tonsillar abscesses.  Eyes: Conjunctivae are normal.  Cardiovascular: Normal rate, regular rhythm, normal heart sounds and intact distal pulses.   No murmur heard. Pulmonary/Chest: Effort normal and breath sounds normal. No respiratory distress. She has no wheezes. She has no rales.  Musculoskeletal: Normal range of motion. She exhibits no edema or tenderness.  Neurological: She is alert and  oriented to person, place, and time. Coordination normal.  Skin: Skin is warm and dry. No rash noted. She is not diaphoretic.  Psychiatric: She has a normal mood and affect. Her behavior is normal.  Vitals reviewed.     Assessment & Plan:   Problem List Items Addressed This Visit    None    Visit Diagnoses    Acute non-recurrent frontal sinusitis    -  Primary   Relevant Medications   azithromycin  (ZITHROMAX) 250 MG tablet   predniSONE (DELTASONE) 20 MG tablet       Follow up plan: Return if symptoms worsen or fail to improve.  Counseling provided for all of the vaccine components No orders of the defined types were placed in this encounter.   Caryl Pina, MD [facility] 07/31/2016, 12:18 PM

## 2016-08-05 ENCOUNTER — Telehealth: Payer: Self-pay | Admitting: Pediatrics

## 2016-08-05 NOTE — Telephone Encounter (Signed)
Aware, to wait for antibiotic to work.  No problem having small amount of blood, (equal to end of a match) since she tried pure peroxide to flush out any wax. Pushing cotton deep in ear opening could have irritated an area also.  Call us if her decreased hearing does not improve. Suggested she probably still has fluid built up and hopefully it will resolve soon.

## 2016-10-13 ENCOUNTER — Telehealth: Payer: Self-pay | Admitting: Pediatrics

## 2016-10-13 ENCOUNTER — Other Ambulatory Visit: Payer: Self-pay | Admitting: Pediatrics

## 2016-10-13 DIAGNOSIS — F411 Generalized anxiety disorder: Secondary | ICD-10-CM

## 2016-10-13 NOTE — Telephone Encounter (Signed)
What is the name of the medication? Alprazolam 1 mg   Have you contacted your pharmacy to request a refill? YES  Which pharmacy would you like this sent to? [facility]   Patient notified that their request is being sent to the clinical staff for review and that they should receive a call once it is complete. If they do not receive a call within 24 hours they can check with their pharmacy or our office.

## 2016-10-14 NOTE — Telephone Encounter (Signed)
Needs to be seen, was supposed to have 3 mo appt.

## 2016-10-14 NOTE — Telephone Encounter (Signed)
Please forward this to her PCP who will be back tomorrow

## 2016-10-14 NOTE — Telephone Encounter (Signed)
Patient was seen 06-25-16 for chronic recheck and was given xanax script for quantity 60 and 2 refills.  Please advise on refill. Does she need to be seen or will refill be given?

## 2016-10-14 NOTE — Telephone Encounter (Signed)
Aware, she needs an appointment to get refills on xanax. She hung the phone up and did not schedule.

## 2016-10-15 NOTE — Telephone Encounter (Signed)
Refill called to [facility] Called pt instructing her refill called in, will NTBS for any future RFs

## 2016-11-05 ENCOUNTER — Ambulatory Visit: Payer: Commercial Managed Care - HMO | Admitting: Pediatrics

## 2016-11-13 ENCOUNTER — Encounter: Payer: Self-pay | Admitting: Nurse Practitioner

## 2016-11-13 ENCOUNTER — Ambulatory Visit (INDEPENDENT_AMBULATORY_CARE_PROVIDER_SITE_OTHER): Payer: Medicare HMO | Admitting: Nurse Practitioner

## 2016-11-13 VITALS — BP 155/80 | HR 68 | Temp 97.0°F | Ht 64.0 in | Wt 201.0 lb

## 2016-11-13 DIAGNOSIS — E785 Hyperlipidemia, unspecified: Secondary | ICD-10-CM

## 2016-11-13 DIAGNOSIS — K1379 Other lesions of oral mucosa: Secondary | ICD-10-CM

## 2016-11-13 DIAGNOSIS — F3342 Major depressive disorder, recurrent, in full remission: Secondary | ICD-10-CM | POA: Diagnosis not present

## 2016-11-13 DIAGNOSIS — I2583 Coronary atherosclerosis due to lipid rich plaque: Secondary | ICD-10-CM

## 2016-11-13 DIAGNOSIS — I2510 Atherosclerotic heart disease of native coronary artery without angina pectoris: Secondary | ICD-10-CM | POA: Diagnosis not present

## 2016-11-13 DIAGNOSIS — J449 Chronic obstructive pulmonary disease, unspecified: Secondary | ICD-10-CM

## 2016-11-13 DIAGNOSIS — I10 Essential (primary) hypertension: Secondary | ICD-10-CM

## 2016-11-13 DIAGNOSIS — E1159 Type 2 diabetes mellitus with other circulatory complications: Secondary | ICD-10-CM | POA: Diagnosis not present

## 2016-11-13 DIAGNOSIS — E559 Vitamin D deficiency, unspecified: Secondary | ICD-10-CM

## 2016-11-13 DIAGNOSIS — G2581 Restless legs syndrome: Secondary | ICD-10-CM

## 2016-11-13 DIAGNOSIS — Z1231 Encounter for screening mammogram for malignant neoplasm of breast: Secondary | ICD-10-CM

## 2016-11-13 DIAGNOSIS — F5101 Primary insomnia: Secondary | ICD-10-CM | POA: Diagnosis not present

## 2016-11-13 DIAGNOSIS — F411 Generalized anxiety disorder: Secondary | ICD-10-CM

## 2016-11-13 LAB — CMP14+EGFR
ALBUMIN: 4.2 g/dL (ref 3.6–4.8)
ALK PHOS: 95 IU/L (ref 39–117)
ALT: 14 IU/L (ref 0–32)
AST: 17 IU/L (ref 0–40)
Albumin/Globulin Ratio: 1.6 (ref 1.2–2.2)
BILIRUBIN TOTAL: 0.5 mg/dL (ref 0.0–1.2)
BUN / CREAT RATIO: 16 (ref 12–28)
BUN: 14 mg/dL (ref 8–27)
CHLORIDE: 100 mmol/L (ref 96–106)
CO2: 23 mmol/L (ref 18–29)
Calcium: 9.5 mg/dL (ref 8.7–10.3)
Creatinine, Ser: 0.86 mg/dL (ref 0.57–1.00)
GFR calc non Af Amer: 74 mL/min/{1.73_m2} (ref >59)
GFR, EST AFRICAN AMERICAN: 85 mL/min/{1.73_m2} (ref >59)
GLOBULIN, TOTAL: 2.6 g/dL (ref 1.5–4.5)
GLUCOSE: 98 mg/dL (ref 65–99)
Potassium: 4.2 mmol/L (ref 3.5–5.2)
Sodium: 141 mmol/L (ref 134–144)
TOTAL PROTEIN: 6.8 g/dL (ref 6.0–8.5)

## 2016-11-13 LAB — BAYER DCA HB A1C WAIVED: HB A1C (BAYER DCA - WAIVED): 6 % (ref <7.0)

## 2016-11-13 LAB — LIPID PANEL
CHOLESTEROL TOTAL: 134 mg/dL (ref 100–199)
Chol/HDL Ratio: 4.2 ratio (ref 0.0–4.4)
HDL: 32 mg/dL — ABNORMAL LOW (ref >39)
LDL Calculated: 77 mg/dL (ref 0–99)
Triglycerides: 124 mg/dL (ref 0–149)
VLDL Cholesterol Cal: 25 mg/dL (ref 5–40)

## 2016-11-13 MED ORDER — LISINOPRIL 20 MG PO TABS: 20.0000 mg | ORAL_TABLET | Freq: Every day | ORAL | 1 refills | Status: DC

## 2016-11-13 MED ORDER — FLUTICASONE FUROATE-VILANTEROL 100-25 MCG/INH IN AEPB: 1.0000 | INHALATION_SPRAY | Freq: Every day | RESPIRATORY_TRACT | 3 refills | Status: DC

## 2016-11-13 MED ORDER — ATORVASTATIN CALCIUM 80 MG PO TABS: 80.0000 mg | ORAL_TABLET | Freq: Every day | ORAL | 1 refills | Status: DC

## 2016-11-13 MED ORDER — ESCITALOPRAM OXALATE 20 MG PO TABS: 20.0000 mg | ORAL_TABLET | Freq: Every day | ORAL | 1 refills | Status: DC

## 2016-11-13 MED ORDER — METOPROLOL TARTRATE 25 MG PO TABS: 25.0000 mg | ORAL_TABLET | Freq: Two times a day (BID) | ORAL | 1 refills | Status: DC

## 2016-11-13 MED ORDER — TRAZODONE HCL 50 MG PO TABS: 25.0000 mg | ORAL_TABLET | Freq: Every evening | ORAL | 3 refills | Status: DC | PRN

## 2016-11-13 MED ORDER — ALPRAZOLAM 1 MG PO TABS: 1.0000 mg | ORAL_TABLET | Freq: Two times a day (BID) | ORAL | 0 refills | Status: DC | PRN

## 2016-11-13 MED ORDER — LISINOPRIL 5 MG PO TABS: 5.0000 mg | ORAL_TABLET | Freq: Every day | ORAL | 1 refills | Status: DC

## 2016-11-13 NOTE — Progress Notes (Signed)
Subjective:    Patient ID: [name], female    DOB: [birth year], 61 y.o.   MRN: [id number]  HPI   FONNIE CROOKSHANKS is here today for follow up of chronic medical problem.  Outpatient Encounter Prescriptions as of 11/13/2016  Medication Sig  . ALPRAZolam (XANAX) 1 MG tablet TAKE ONE TABLET BY MOUTH TWICE DAILY AS NEEDED  . aspirin 325 MG tablet Take 325 mg by mouth daily.    [name] atorvastatin (LIPITOR) 80 MG tablet Take 1 tablet (80 mg total) by mouth daily.  . blood glucose meter kit and supplies KIT Dispense based on pt and ins preference. Use up to QDas directed. (FOR ICD-10 E11.9).  [name] Cholecalciferol (VITAMIN D3) 3000 UNITS TABS Take 1 tablet by mouth daily.  [name] escitalopram (LEXAPRO) 20 MG tablet Take 1 tablet (20 mg total) by mouth daily.  . fish oil-omega-3 fatty acids 1000 MG capsule Take 2 g by mouth 2 (two) times daily.   . fluticasone furoate-vilanterol (BREO ELLIPTA) 100-25 MCG/INH AEPB Inhale 1 puff into the lungs daily.  . isosorbide mononitrate (IMDUR) 30 MG 24 hr tablet Take 0.5 tablets (15 mg total) by mouth daily.  [name] lisinopril (PRINIVIL,ZESTRIL) 5 MG tablet Take 1 tablet (5 mg total) by mouth daily.  . metFORMIN (GLUCOPHAGE) 500 MG tablet Take 1 tablet (500 mg total) by mouth 2 (two) times daily with a meal.  . metoprolol tartrate (LOPRESSOR) 25 MG tablet Take 1 tablet (25 mg total) by mouth 2 (two) times daily.  . pramipexole (MIRAPEX) 0.5 MG tablet TAKE ONE TABLET BY MOUTH TWICE DAILY  . vitamin B-12 (CYANOCOBALAMIN) 1000 MCG tablet Take 1,000 mcg by mouth daily.     1. Type 2 diabetes mellitus with other circulatory complication, without long-term current use of insulin (HCC)  Does not check blood sugars every day- no hypoglycemia that she is aware of- last Hgab1c was 5.8%  2. Hyperlipidemia, unspecified hyperlipidemia type  Tries to watch diet- takes lipitor daily without myalgias  3. Essential hypertension  No c/o chest pain, SOB or HA  4. Coronary artery  disease due to lipid rich plaque  Does not see cardiology  5. Chronic obstructive pulmonary disease, unspecified COPD type ([place])  On BREO daily- denies SOB  6. Recurrent major depressive disorder, in full remission ([place])  Takes lexapro daily- doing well Depression screen [facility] 2/9 11/13/2016 07/31/2016 06/25/2016 04/08/2016 12/20/2015  Decreased Interest 0 2 0 3 1  Down, Depressed, Hopeless 0 2 0 2 1  PHQ - 2 Score 0 4 0 5 2  Altered sleeping - 3 - 3 3  Tired, decreased energy - 3 - 2 2  Change in appetite - 2 - 3 1  Feeling bad or failure about yourself  - 2 - 2 1  Trouble concentrating - 2 - - 0  Moving slowly or fidgety/restless - 1 - 0 0  Suicidal thoughts - 1 - 0 0  PHQ-9 Score - 18 - 15 9  Difficult doing work/chores - Somewhat difficult - Very difficult -     7. Generalized anxiety disorder  Stays anxious- has been on xanax for many years- helps most days  8. Morbid obesity ([place])  No recent weight gain or weight oss  9. Restless leg syndrome  Takes mirapex nightly and really seems to help  10. Vitamin D deficiency  Currently not on any supplements  11.    insomnia          Doxepin does  not work for her  New complaints: large tender nodule on right jaw .cheek area- saw Dr. Denna Haggard in the past and he said she would have to get approval from insurance to have removed- Is getting larger    Review of Systems  Constitutional: Negative.   HENT: Negative.   Respiratory: Negative.   Cardiovascular: Negative.   Genitourinary: Negative.   Psychiatric/Behavioral: Negative.   All other systems reviewed and are negative.      Objective:   Physical Exam  Constitutional: She is oriented to person, place, and time. She appears well-developed and well-nourished.  HENT:  Nose: Nose normal.  Mouth/Throat: Oropharynx is clear and moist.  8cm tender mobile nodule on right jaw line/cheek  Eyes: EOM are normal.  Neck: Trachea normal, normal range of motion and full passive range of  motion without pain. Neck supple. No JVD present. Carotid bruit is not present. No thyromegaly present.  Cardiovascular: Normal rate, regular rhythm, normal heart sounds and intact distal pulses.  Exam reveals no gallop and no friction rub.   No murmur heard. Pulmonary/Chest: Effort normal and breath sounds normal.  Abdominal: Soft. Bowel sounds are normal. She exhibits no distension and no mass. There is no tenderness.  Musculoskeletal: Normal range of motion.  Lymphadenopathy:    She has no cervical adenopathy.  Neurological: She is alert and oriented to person, place, and time. She has normal reflexes.  Skin: Skin is warm and dry.  Psychiatric: She has a normal mood and affect. Her behavior is normal. Judgment and thought content normal.    BP (!) 155/80   Pulse 68   Temp 97 F (36.1 C) (Oral)   Ht 5' 4"  (1.626 m)   Wt 201 lb (91.2 kg)   LMP 10/17/2005   BMI 34.50 kg/m      Assessment & Plan:  1. Type 2 diabetes mellitus with other circulatory complication, without long-term current use of insulin (HCC) Continue to watch carbs on diet - Bayer DCA Hb A1c Waived - Microalbumin / creatinine urine ratio  2. Hyperlipidemia, unspecified hyperlipidemia type Low fat diet - Lipid panel - metoprolol tartrate (LOPRESSOR) 25 MG tablet; Take 1 tablet (25 mg total) by mouth 2 (two) times daily.  Dispense: 180 tablet; Refill: 1 - atorvastatin (LIPITOR) 80 MG tablet; Take 1 tablet (80 mg total) by mouth daily.  Dispense: 90 tablet; Refill: 1  3. Essential hypertension Low sodium diet Increased lisinopril dose today - CMP14+EGFR - lisinopril (PRINIVIL,ZESTRIL) 20 MG tablet; Take 1 tablet (20 mg total) by mouth daily.  Dispense: 90 tablet; Refill: 1  4. Coronary artery disease due to lipid rich plaque  5. Chronic obstructive pulmonary disease, unspecified COPD type (HCC) - fluticasone furoate-vilanterol (BREO ELLIPTA) 100-25 MCG/INH AEPB; Inhale 1 puff into the lungs daily.  Dispense:  60 each; Refill: 3  6. Recurrent major depressive disorder, in full remission [facility]) Stress management  7. Generalized anxiety disorder - escitalopram (LEXAPRO) 20 MG tablet; Take 1 tablet (20 mg total) by mouth daily.  Dispense: 90 tablet; Refill: 1 - ALPRAZolam (XANAX) 1 MG tablet; Take 1 tablet (1 mg total) by mouth 2 (two) times daily as needed.  Dispense: 60 tablet; Refill: 0  8. Morbid obesity ([place]) Discussed diet and exercise for person with BMI >25 Will recheck weight in 3-6 months  9. Restless leg syndrome keps legs warn at night  10. Vitamin D deficiency  11. Nodule of cheek Continue to watcch until apointmnet - Ambulatory referral to ENT  12. Primary insomnia Bedtime routine Stop doxepin- changed to trazadone- traZODone (DESYREL) 50 MG tablet; Take 0.5-1 tablets (25-50 mg total) by mouth at bedtime as needed for sleep.  Dispense: 30 tablet; Refill: 3    Labs pending Health maintenance reviewed Diet and exercise encouraged Continue all meds Follow up  In 3 months   [place], FNP

## 2016-11-14 LAB — MICROALBUMIN / CREATININE URINE RATIO
CREATININE, UR: 90.6 mg/dL
MICROALB/CREAT RATIO: 61.3 mg/g{creat} — AB (ref 0.0–30.0)
MICROALBUM., U, RANDOM: 55.5 ug/mL

## 2016-12-04 DIAGNOSIS — Z1231 Encounter for screening mammogram for malignant neoplasm of breast: Secondary | ICD-10-CM | POA: Diagnosis not present

## 2016-12-14 ENCOUNTER — Other Ambulatory Visit: Payer: Self-pay | Admitting: Nurse Practitioner

## 2016-12-14 DIAGNOSIS — F411 Generalized anxiety disorder: Secondary | ICD-10-CM

## 2016-12-15 NOTE — Telephone Encounter (Signed)
Called to [facility] 

## 2016-12-15 NOTE — Telephone Encounter (Signed)
Please call in xanax with 1 refills 

## 2016-12-15 NOTE — Telephone Encounter (Signed)
Last seen and filled 11/13/16. Call in

## 2017-01-06 ENCOUNTER — Other Ambulatory Visit: Payer: Self-pay | Admitting: Pediatrics

## 2017-01-12 ENCOUNTER — Ambulatory Visit: Payer: Medicare HMO

## 2017-01-14 ENCOUNTER — Ambulatory Visit: Payer: Medicare HMO

## 2017-01-18 ENCOUNTER — Ambulatory Visit: Payer: Medicare HMO

## 2017-01-25 ENCOUNTER — Ambulatory Visit: Payer: Medicare HMO

## 2017-01-29 ENCOUNTER — Ambulatory Visit (INDEPENDENT_AMBULATORY_CARE_PROVIDER_SITE_OTHER): Payer: Medicare HMO | Admitting: *Deleted

## 2017-01-29 ENCOUNTER — Telehealth: Payer: Self-pay | Admitting: *Deleted

## 2017-01-29 VITALS — BP 149/92 | HR 61 | Ht 63.75 in | Wt 197.0 lb

## 2017-01-29 DIAGNOSIS — Z0000 Encounter for general adult medical examination without abnormal findings: Secondary | ICD-10-CM

## 2017-01-29 DIAGNOSIS — Z1211 Encounter for screening for malignant neoplasm of colon: Secondary | ICD-10-CM

## 2017-01-29 NOTE — Telephone Encounter (Signed)
Can't take trazodone to help her sleep because it makes her sick. She has tried OTC sleep aids and they do not help. She is having a difficult time right now with her anxiety/depression. She is dealing with her sister's estate and is having a hard time with her daughter and son-in-law. She plans on getting in with [facility] in the next couple of weeks.   She would like to know if you can prescribe a little more xanax to help her sleep and help with her anxiety. She is prescribed 1mg  BID. She often takes 1.5 tablets at bedtime and uses the other .5 during the day. She would sleep better if she had 2mg  at bedtime. She is being seen today for an AWV.

## 2017-01-29 NOTE — Patient Instructions (Signed)
  Ms. Stream , Thank you for taking time to come for your Medicare Wellness Visit. I appreciate your ongoing commitment to your health goals. Please review the following plan we discussed and let me know if I can assist you in the future.   These are the goals we discussed: Goals    . Have 3 meals a day       Schedule an appointment with a counselor/psychiatrist.  This is a list of the screening recommended for you and due dates:  Health Maintenance  Topic Date Due  . Pap Smear  05/24/2004  . Tetanus Vaccine  05/18/2011  . Mammogram  12/22/2014  . Complete foot exam   12/19/2016  . Pneumococcal vaccine (1) 05/15/2017*  . Lipid (cholesterol) test  02/12/2017  . Colon Cancer Screening  02/14/2017  . Flu Shot  02/24/2017  . Hemoglobin A1C  05/15/2017  . Eye exam for diabetics  06/30/2017  .  Hepatitis C: One time screening is recommended by Center for Disease Control  (CDC) for  adults born from [birth year] through 1964.   Completed  . HIV Screening  Completed  *Topic was postponed. The date shown is not the original due date.

## 2017-01-29 NOTE — Telephone Encounter (Signed)
Patient needs to be seen to change controlled meds

## 2017-02-01 NOTE — Progress Notes (Signed)
Subjective:   [name] is a 61 y.o. female who presents for a subsequent  Medicare Annual Wellness Visit. [name] is retired from the [employer]. She enjoys working in her garden. She lives at home alone. She and her sister moved in to take care of their mother a few years back. Her mother passed away and her sister died suddenly last year. She has one daughter and two grandchildren. Her daughter is currently not speaking to her. She does have 2 very supportive friends. She has a dog.   Review of Systems    Reports that health is worse than last year.   Cardiac Risk Factors include: advanced age (>85mn, >>71women);diabetes mellitus;dyslipidemia;hypertension;obesity (BMI >30kg/m2);sedentary lifestyle;smoking/ tobacco exposure  Psych: Anxiety, insomnia, and some depression related to the death of her sister and dealing with her estate and her daughter's estrangement.   Other systems negative    Objective:    Today's Vitals   01/29/17 1600  BP: (!) 149/92  Pulse: 61  Weight: 197 lb (89.4 kg)  Height: 5' 3.75" (1.619 m)   Body mass index is 34.08 kg/m.   Current Medications (verified) Outpatient Encounter Prescriptions as of 01/29/2017  Medication Sig  . ALPRAZolam (XANAX) 1 MG tablet Take 1 tablet (1 mg total) by mouth 2 (two) times daily as needed for anxiety.  .[name] Kitchenaspirin 325 MG tablet Take 325 mg by mouth daily.    .[name] Kitchenatorvastatin (LIPITOR) 80 MG tablet Take 1 tablet (80 mg total) by mouth daily.  . blood glucose meter kit and supplies KIT Dispense based on pt and ins preference. Use up to QDas directed. (FOR ICD-10 E11.9).  .[name] KitchenCholecalciferol (VITAMIN D3) 3000 UNITS TABS Take 1 tablet by mouth daily.  .[name] Kitchenescitalopram (LEXAPRO) 20 MG tablet Take 1 tablet (20 mg total) by mouth daily.  . fish oil-omega-3 fatty acids 1000 MG capsule Take 2 g by mouth 2 (two) times daily.   . fluticasone furoate-vilanterol (BREO ELLIPTA) 100-25 MCG/INH AEPB Inhale 1 puff into the lungs  daily.  . isosorbide mononitrate (IMDUR) 30 MG 24 hr tablet Take 0.5 tablets (15 mg total) by mouth daily.  .[name] Kitchenlisinopril (PRINIVIL,ZESTRIL) 20 MG tablet Take 1 tablet (20 mg total) by mouth daily.  . metFORMIN (GLUCOPHAGE) 500 MG tablet Take 1 tablet (500 mg total) by mouth 2 (two) times daily with a meal.  . metoprolol tartrate (LOPRESSOR) 25 MG tablet Take 1 tablet (25 mg total) by mouth 2 (two) times daily.  . pramipexole (MIRAPEX) 0.5 MG tablet TAKE ONE TABLET BY MOUTH TWICE DAILY  . vitamin B-12 (CYANOCOBALAMIN) 1000 MCG tablet Take 1,000 mcg by mouth daily.  . [DISCONTINUED] traZODone (DESYREL) 50 MG tablet Take 0.5-1 tablets (25-50 mg total) by mouth at bedtime as needed for sleep.   No facility-administered encounter medications on file as of 01/29/2017.     Allergies (verified) Food   History: Past Medical History:  Diagnosis Date  . Anxiety   . Breast cancer ([place] 06/[place]   inv ductal, ER/PR +, her-2 -  . CAD (coronary artery disease)    NSTEMI 4/08: OM3 occluded (PCI unsuccessful), dRCA 95% => BMS, inf HK, EF 50%;   b. MV 11/12:  [place] ischemia => c.  [place]11/12:  dLAD 70%, OM1 70-80%, then occluded (no change from 2008), dOM filled L->L collats, mRCA stent occluded, dRCA filled L->R collats, EF 55-65% => med Rx (consider PCI of RCA if refractory angina)  . Carotid stenosis  dopplers 5/08: 0-30% bilateral; 10/12: 0-39% B/L ICA => f/u 04/2013  . Chronic kidney disease   . COPD (chronic obstructive pulmonary disease) ([place])   . Depression   . Depression with anxiety   . GERD (gastroesophageal reflux disease)    OTC acid reducer prn  . Headache(784.0)    sinus; occ. migraines  . Hx of radiation therapy 08/24/11 to 10/07/11   L breast  . Hyperlipidemia   . Hypertension    under control; has been on med. x 4 yrs.  . Myocardial infarction ([place])   . Stable angina ([place])    a. med Rx after cath 05/2011  . Stress incontinence, female    Past Surgical History:  Procedure  Laterality Date  . AXILLARY LYMPH NODE DISSECTION  07/31/2011   Procedure: AXILLARY LYMPH NODE DISSECTION;  Surgeon: [name], MD;  Location: [facility];  Service: General;  Laterality: Left;  left axillary sentinal node biopsy  . BREAST SURGERY    . CARDIAC CATHETERIZATION  11/02/2006; 06/03/2011  . CESAREAN SECTION  1987  . CORONARY STENT PLACEMENT  11/02/2006  . ELBOW SURGERY     right  . MASTECTOMY PARTIAL / LUMPECTOMY  06/30/2011   left   Family History  Problem Relation Age of Onset  . Heart disease Mother   . Heart disease Father   . Cancer Maternal Aunt        pt unaware of what kind  . Cancer Maternal Grandmother        ovarian  . Heart attack Sister   . Bipolar disorder Sister    Social History   Occupational History  . Not on file.   Social History Main Topics  . Smoking status: Current Every Day Smoker    Packs/day: 1.00    Years: 35.00    Types: Cigarettes  . Smokeless tobacco: Never Used  . Alcohol use 0.6 oz/week    1 Glasses of wine per week     Comment: occasionally  . Drug use: No  . Sexual activity: No    Tobacco Counseling Ready to quit: No Counseling given: No   Activities of Daily Living In your present state of health, do you have any difficulty performing the following activities: 01/29/2017  Hearing? N  Vision? N  Difficulty concentrating or making decisions? Y  Walking or climbing stairs? N  Dressing or bathing? N  Doing errands, shopping? N  Preparing Food and eating ? N  Using the Toilet? N  In the past six months, have you accidently leaked urine? N  Do you have problems with loss of bowel control? N  Managing your Medications? N  Managing your Finances? N  Housekeeping or managing your Housekeeping? N  Some recent data might be hidden    Immunizations and Health Maintenance Immunization History  Administered Date(s) Administered  . Tdap 05/17/2001   Health Maintenance Due  Topic Date Due  . PAP SMEAR   05/24/2004  . TETANUS/TDAP  05/18/2011  . MAMMOGRAM  12/22/2014  . FOOT EXAM  12/19/2016    Patient Care Team: Eustaquio Maize, MD as PCP - General (Pediatrics) Nat Christen, MD as Attending Physician (Optometry)   No ER visits, hospitalizations, or surgeries this past year.      Assessment:   This is a routine wellness examination for [name].   Hearing/Vision screen No hearing or vision deficits noted during visit. Last eye exam was last year.  Dietary issues and exercise activities discussed:  Current Exercise Habits: The patient does not participate in regular exercise at present, Exercise limited by: orthopedic condition(s) (knee pain)  Diet: Eats 1 to 2 meals a day. Decreased appetite.   Goals    . Have 3 meals a day      Depression Screen PHQ 2/9 Scores 01/29/2017 11/13/2016 07/31/2016 06/25/2016 04/08/2016 12/20/2015 11/21/2015  PHQ - 2 Score 5 0 4 0 5 2 4   PHQ- 9 Score 21 - 18 - 15 9 19   On medication and will follow up with PCP and psych.   Fall Risk Fall Risk  01/29/2017 11/13/2016 06/25/2016 04/08/2016 12/20/2015  Falls in the past year? No No No No No  Number falls in past yr: - - - - -  Injury with Fall? - - - - -  Follow up - - - - -    Cognitive Function: MMSE - Mini Mental State Exam 01/29/2017 04/18/2015  Orientation to time 5 5  Orientation to Place 5 5  Registration 3 3  Attention/ Calculation 5 5  Recall 3 3  Language- name 2 objects 2 2  Language- repeat 1 1  Language- follow 3 step command 3 3  Language- read & follow direction 1 1  Write a sentence 1 1  Copy design 1 1  Total score 30 30    Normal exam    Screening Tests Health Maintenance  Topic Date Due  . PAP SMEAR  05/24/2004  . TETANUS/TDAP  05/18/2011  . MAMMOGRAM  12/22/2014  . FOOT EXAM  12/19/2016  . PNEUMOCOCCAL POLYSACCHARIDE VACCINE (1) 05/15/2017 (Originally 08/22/1957)  . LIPID PANEL  02/12/2017  . COLONOSCOPY  02/14/2017  . INFLUENZA VACCINE  02/24/2017  . HEMOGLOBIN  A1C  05/15/2017  . OPHTHALMOLOGY EXAM  06/30/2017  . Hepatitis C Screening  Completed  . HIV Screening  Completed   Mamm[facility] 12/[place]/16 [place] [facility]. Will get report.    Plan:  Get in with pscyh to discuss anxiety, depression, and insomnia.  Needs to f/u with [name] as well Try to exercise for at least 30 minutes a day.  Return a copy of Advance Directives to our office Colonoscopy referral placed  I have personally reviewed and noted the following in the patient's chart:   . Medical and social history . Use of alcohol, tobacco or illicit drugs  . Current medications and supplements . Functional ability and status . Nutritional status . Physical activity . Advanced directives . List of other physicians . Hospitalizations, surgeries, and ER visits in previous 12 months . Vitals . Screenings to include cognitive, depression, and falls . Referrals and appointments  In addition, I have reviewed and discussed with patient certain preventive protocols, quality metrics, and best practice recommendations. A written personalized care plan for preventive services as well as general preventive health recommendations were provided to patient.     [name], RN   02/01/2017   I have reviewed and agree with the above AWV documentation.   [name], FNP

## 2017-02-12 ENCOUNTER — Other Ambulatory Visit: Payer: Self-pay | Admitting: *Deleted

## 2017-02-12 DIAGNOSIS — F411 Generalized anxiety disorder: Secondary | ICD-10-CM

## 2017-02-12 MED ORDER — ALPRAZOLAM 1 MG PO TABS: 1.0000 mg | ORAL_TABLET | Freq: Two times a day (BID) | ORAL | 0 refills | Status: DC | PRN

## 2017-02-12 NOTE — Telephone Encounter (Signed)
rx called into pharmacy and pt is aware and has appt scheduled with Dr Evette Doffing 02/24/17 at 12:30.

## 2017-02-13 ENCOUNTER — Other Ambulatory Visit: Payer: Self-pay | Admitting: Physician Assistant

## 2017-02-13 DIAGNOSIS — F411 Generalized anxiety disorder: Secondary | ICD-10-CM

## 2017-02-15 NOTE — Telephone Encounter (Signed)
Please call in alprazolam with 1 refills 

## 2017-02-15 NOTE — Telephone Encounter (Signed)
Refill called to [facility]

## 2017-02-15 NOTE — Telephone Encounter (Signed)
Last seen 11/13/16  MMM  IF approved route to nurse to call into [facility]

## 2017-02-24 ENCOUNTER — Ambulatory Visit: Payer: Medicare HMO | Admitting: Pediatrics

## 2017-03-03 ENCOUNTER — Ambulatory Visit: Payer: Medicare HMO | Admitting: Pediatrics

## 2017-03-08 ENCOUNTER — Ambulatory Visit: Payer: Medicare HMO | Admitting: Pediatrics

## 2017-03-09 ENCOUNTER — Encounter: Payer: Self-pay | Admitting: Pediatrics

## 2017-03-10 ENCOUNTER — Encounter: Payer: Self-pay | Admitting: Nurse Practitioner

## 2017-03-10 ENCOUNTER — Ambulatory Visit (INDEPENDENT_AMBULATORY_CARE_PROVIDER_SITE_OTHER): Payer: Medicare HMO | Admitting: Nurse Practitioner

## 2017-03-10 VITALS — BP 175/76 | HR 57 | Temp 96.7°F | Ht 63.0 in | Wt 201.0 lb

## 2017-03-10 DIAGNOSIS — I6523 Occlusion and stenosis of bilateral carotid arteries: Secondary | ICD-10-CM

## 2017-03-10 DIAGNOSIS — I10 Essential (primary) hypertension: Secondary | ICD-10-CM

## 2017-03-10 DIAGNOSIS — F3342 Major depressive disorder, recurrent, in full remission: Secondary | ICD-10-CM

## 2017-03-10 DIAGNOSIS — E7800 Pure hypercholesterolemia, unspecified: Secondary | ICD-10-CM

## 2017-03-10 DIAGNOSIS — F411 Generalized anxiety disorder: Secondary | ICD-10-CM | POA: Diagnosis not present

## 2017-03-10 DIAGNOSIS — E785 Hyperlipidemia, unspecified: Secondary | ICD-10-CM

## 2017-03-10 DIAGNOSIS — E1159 Type 2 diabetes mellitus with other circulatory complications: Secondary | ICD-10-CM | POA: Diagnosis not present

## 2017-03-10 DIAGNOSIS — I2583 Coronary atherosclerosis due to lipid rich plaque: Secondary | ICD-10-CM

## 2017-03-10 DIAGNOSIS — G2581 Restless legs syndrome: Secondary | ICD-10-CM | POA: Diagnosis not present

## 2017-03-10 DIAGNOSIS — I2510 Atherosclerotic heart disease of native coronary artery without angina pectoris: Secondary | ICD-10-CM

## 2017-03-10 DIAGNOSIS — J449 Chronic obstructive pulmonary disease, unspecified: Secondary | ICD-10-CM | POA: Diagnosis not present

## 2017-03-10 DIAGNOSIS — I213 ST elevation (STEMI) myocardial infarction of unspecified site: Secondary | ICD-10-CM | POA: Diagnosis not present

## 2017-03-10 LAB — BAYER DCA HB A1C WAIVED: HB A1C (BAYER DCA - WAIVED): 5.8 % (ref <7.0)

## 2017-03-10 MED ORDER — PRAMIPEXOLE DIHYDROCHLORIDE 0.5 MG PO TABS: 0.5000 mg | ORAL_TABLET | Freq: Two times a day (BID) | ORAL | 1 refills | Status: DC

## 2017-03-10 MED ORDER — ALPRAZOLAM 1 MG PO TABS: 1.0000 mg | ORAL_TABLET | Freq: Two times a day (BID) | ORAL | 1 refills | Status: DC | PRN

## 2017-03-10 MED ORDER — METFORMIN HCL 500 MG PO TABS: 500.0000 mg | ORAL_TABLET | Freq: Two times a day (BID) | ORAL | 1 refills | Status: DC

## 2017-03-10 MED ORDER — ATORVASTATIN CALCIUM 80 MG PO TABS: 80.0000 mg | ORAL_TABLET | Freq: Every day | ORAL | 1 refills | Status: DC

## 2017-03-10 MED ORDER — LISINOPRIL 20 MG PO TABS: 20.0000 mg | ORAL_TABLET | Freq: Every day | ORAL | 1 refills | Status: DC

## 2017-03-10 MED ORDER — ESCITALOPRAM OXALATE 20 MG PO TABS: 20.0000 mg | ORAL_TABLET | Freq: Every day | ORAL | 1 refills | Status: DC

## 2017-03-10 MED ORDER — ISOSORBIDE MONONITRATE ER 30 MG PO TB24: 15.0000 mg | ORAL_TABLET | Freq: Every day | ORAL | 2 refills | Status: DC

## 2017-03-10 MED ORDER — METOPROLOL TARTRATE 25 MG PO TABS: 25.0000 mg | ORAL_TABLET | Freq: Two times a day (BID) | ORAL | 1 refills | Status: DC

## 2017-03-10 MED ORDER — FLUTICASONE FUROATE-VILANTEROL 100-25 MCG/INH IN AEPB: 1.0000 | INHALATION_SPRAY | Freq: Every day | RESPIRATORY_TRACT | 3 refills | Status: DC

## 2017-03-10 NOTE — Progress Notes (Addendum)
Subjective:    Patient ID: [name], female    DOB: [birth year], 61 y.o.   MRN: [id number]  HPI  [name] is here today for follow up of chronic medical problem.  Outpatient Encounter Prescriptions as of 03/10/2017  Medication Sig  . ALPRAZolam (XANAX) 1 MG tablet TAKE 1 TABLET BY MOUTH TWICE DAILY AS NEEDED FOR ANXIETY  . aspirin 325 MG tablet Take 325 mg by mouth daily.    [name] atorvastatin (LIPITOR) 80 MG tablet Take 1 tablet (80 mg total) by mouth daily.  . blood glucose meter kit and supplies KIT Dispense based on pt and ins preference. Use up to QDas directed. (FOR ICD-10 E11.9).  [name] Cholecalciferol (VITAMIN D3) 3000 UNITS TABS Take 1 tablet by mouth daily.  [name] escitalopram (LEXAPRO) 20 MG tablet Take 1 tablet (20 mg total) by mouth daily.  . fish oil-omega-3 fatty acids 1000 MG capsule Take 2 g by mouth 2 (two) times daily.   . fluticasone furoate-vilanterol (BREO ELLIPTA) 100-25 MCG/INH AEPB Inhale 1 puff into the lungs daily.  . isosorbide mononitrate (IMDUR) 30 MG 24 hr tablet Take 0.5 tablets (15 mg total) by mouth daily.  [name] lisinopril (PRINIVIL,ZESTRIL) 20 MG tablet Take 1 tablet (20 mg total) by mouth daily.  . metFORMIN (GLUCOPHAGE) 500 MG tablet Take 1 tablet (500 mg total) by mouth 2 (two) times daily with a meal.  . metoprolol tartrate (LOPRESSOR) 25 MG tablet Take 1 tablet (25 mg total) by mouth 2 (two) times daily.  . pramipexole (MIRAPEX) 0.5 MG tablet TAKE ONE TABLET BY MOUTH TWICE DAILY  . vitamin B-12 (CYANOCOBALAMIN) 1000 MCG tablet Take 1,000 mcg by mouth daily.   No facility-administered encounter medications on file as of 03/10/2017.     1. ST elevation myocardial infarction (STEMI), unspecified artery (HCC) No c/o chest pain. Has not seen cardiology in awhile   2. Essential hypertension  No c/o chest, SOB or headache. Does not check blood pressure at hoome  3. Bilateral carotid artery stenosis  Has not had doppler in awhile- says she does not want  to do right now  4. Coronary artery disease due to lipid rich plaque   5. Chronic obstructive pulmonary disease, unspecified COPD type ([place]) on BREO - no change in breathing  6. Type 2 diabetes mellitus with other circulatory complication, without long-term current use of insulin (HCC) last HGBA1c was 6.0%. She does not check blood sugars very often. No hypoglycemia  7. Restless leg syndrome  Still bothers her some at night  8. Morbid obesity ([place])  No recent weight changes  9. Pure hypercholesterolemia  Currently not watching diet  10. Generalized anxiety disorder  Patient is on xanax 19m rx BID - she is under a lot of stress with death of her sister and has needed a extra dose some days  11. Recurrent major depressive disorder, in full remission ([facility]  is on lexapro daily [facility] 03/10/2017 01/29/2017 11/13/2016 07/31/2016 06/25/2016  Decreased Interest 2 3 0 2 0  Down, Depressed, Hopeless 2 2 0 2 0  PHQ - 2 Score 4 5 0 4 0  Altered sleeping 3 3 - 3 -  Tired, decreased energy 2 3 - 3 -  Change in appetite 2 2 - 2 -  Feeling bad or failure about yourself  1 3 - 2 -  Trouble concentrating 2 2 - 2 -  Moving slowly or fidgety/restless 2 2 - 1 -  Suicidal  thoughts 0 1 - 1 -  PHQ-9 Score 16 21 - 18 -  Difficult doing work/chores - Very difficult - Somewhat difficult -       New complaints: stress level is really bad  Social history: Lives alone- is trying to settle the estate of her dead sister. Dr. Evette Doffing in the past refused to give her xanax TID.    Review of Systems  Constitutional: Negative for activity change and appetite change.  HENT: Negative.   Eyes: Negative for pain.  Respiratory: Negative for shortness of breath.   Cardiovascular: Negative for chest pain, palpitations and leg swelling.  Gastrointestinal: Negative for abdominal pain.  Endocrine: Negative for polydipsia.  Genitourinary: Negative.   Skin: Negative for rash.  Neurological: Negative  for dizziness, weakness and headaches.  Hematological: Does not bruise/bleed easily.  Psychiatric/Behavioral: The patient is nervous/anxious.   All other systems reviewed and are negative.      Objective:   Physical Exam  Constitutional: She is oriented to person, place, and time. She appears well-developed and well-nourished.  HENT:  Nose: Nose normal.  Mouth/Throat: Oropharynx is clear and moist.  Right parotid nodule   Eyes: EOM are normal.  Neck: Trachea normal, normal range of motion and full passive range of motion without pain. Neck supple. No JVD present. Carotid bruit is not present. No thyromegaly present.  Cardiovascular: Normal rate, regular rhythm, normal heart sounds and intact distal pulses.  Exam reveals no gallop and no friction rub.   No murmur heard. Pulmonary/Chest: Effort normal and breath sounds normal.  Abdominal: Soft. Bowel sounds are normal. She exhibits no distension and no mass. There is no tenderness.  Musculoskeletal: Normal range of motion.  Lymphadenopathy:    She has no cervical adenopathy.  Neurological: She is alert and oriented to person, place, and time. She has normal reflexes.  Skin: Skin is warm and dry.  Psychiatric: Her behavior is normal. Judgment and thought content normal.  Very anxious and talkative today    BP (!) 175/76   Pulse (!) 57   Temp (!) 96.7 F (35.9 C) (Oral)   Ht _0  (1.6 m)   Wt 201 lb (91.2 kg)   LMP 10/17/2005   BMI 35.61 kg/m        Assessment & Plan:  1. ST elevation myocardial infarction (STEMI), unspecified artery ([place])  2. Essential hypertension Low soium diet - lisinopril (PRINIVIL,ZESTRIL) 20 MG tablet; Take 1 tablet (20 mg total) by mouth daily.  Dispense: 90 tablet; Refill: 1 - CMP14+EGFR  3. Bilateral carotid artery stenosis Needs doppler but patient refued  4. Coronary artery disease due to lipid rich plaque  5. Chronic obstructive pulmonary disease, unspecified COPD type ([place]) Stop  smoking - fluticasone furoate-vilanterol (BREO ELLIPTA) 100-25 MCG/INH AEPB; Inhale 1 puff into the lungs daily.  Dispense: 60 each; Refill: 3  6. Type 2 diabetes mellitus with other circulatory complication, without long-term current use of insulin (HCC) Continue to watch carbs in diet - metFORMIN (GLUCOPHAGE) 500 MG tablet; Take 1 tablet (500 mg total) by mouth 2 (two) times daily with a meal.  Dispense: 180 tablet; Refill: 1 - Bayer DCA Hb A1c Waived  7. Restless leg syndrome Keep legs warm at night - pramipexole (MIRAPEX) 0.5 MG tablet; Take 1 tablet (0.5 mg total) by mouth 2 (two) times daily.  Dispense: 180 tablet; Refill: 1  8. Morbid obesity ([place]) Discussed diet and exercise for person with BMI >25 Will recheck weight in 3-6 months  9. Pure hypercholesterolemia Low fat diet - Lipid panel  10. Generalized anxiety disorder Increased lexapro by 1/2 tablet daily- will only do for 2 months then needs to go back to BID - escitalopram (LEXAPRO) 20 MG tablet; Take 1 tablet (20 mg total) by mouth daily.  Dispense: 90 tablet; Refill: 1 - ALPRAZolam (XANAX) 1 MG tablet; Take 1 tablet (1 mg total) by mouth 2 (two) times daily as needed. for anxiety  Dispense: 75 tablet; Refill: 1  11. Recurrent major depressive disorder, in full remission ([place]) stressmanagement  12. Atherosclerosis of native coronary artery without angina pectoris, unspecified whether native or transplanted heart - isosorbide mononitrate (IMDUR) 30 MG 24 hr tablet; Take 0.5 tablets (15 mg total) by mouth daily.  Dispense: 90 tablet; Refill: 2  13. Hyperlipidemia, unspecified hyperlipidemia type - atorvastatin (LIPITOR) 80 MG tablet; Take 1 tablet (80 mg total) by mouth daily.  Dispense: 90 tablet; Refill: 1 - metoprolol tartrate (LOPRESSOR) 25 MG tablet; Take 1 tablet (25 mg total) by mouth 2 (two) times daily.  Dispense: 180 tablet; Refill: 1   Keep appointment for parotid nodule. Labs pending Health maintenance  reviewed Diet and exercise encouraged Continue all meds Follow up  In 2 months to discuss xanax-   [name], FNP

## 2017-03-10 NOTE — Patient Instructions (Signed)
Stress and Stress Management Stress is a normal reaction to life events. It is what you feel when life demands more than you are used to or more than you can handle. Some stress can be useful. For example, the stress reaction can help you catch the last bus of the day, study for a test, or meet a deadline at work. But stress that occurs too often or for too long can cause problems. It can affect your emotional health and interfere with relationships and normal daily activities. Too much stress can weaken your immune system and increase your risk for physical illness. If you already have a medical problem, stress can make it worse. What are the causes? All sorts of life events may cause stress. An event that causes stress for one person may not be stressful for another person. Major life events commonly cause stress. These may be positive or negative. Examples include losing your job, moving into a new home, getting married, having a baby, or losing a loved one. Less obvious life events may also cause stress, especially if they occur day after day or in combination. Examples include working long hours, driving in traffic, caring for children, being in debt, or being in a difficult relationship. What are the signs or symptoms? Stress may cause emotional symptoms including, the following:  Anxiety. This is feeling worried, afraid, on edge, overwhelmed, or out of control.  Anger. This is feeling irritated or impatient.  Depression. This is feeling sad, down, helpless, or guilty.  Difficulty focusing, remembering, or making decisions.  Stress may cause physical symptoms, including the following:  Aches and pains. These may affect your head, neck, back, stomach, or other areas of your body.  Tight muscles or clenched jaw.  Low energy or trouble sleeping.  Stress may cause unhealthy behaviors, including the following:  Eating to feel better (overeating) or skipping meals.  Sleeping too little,  too much, or both.  Working too much or putting off tasks (procrastination).  Smoking, drinking alcohol, or using drugs to feel better.  How is this diagnosed? Stress is diagnosed through an assessment by your health care provider. Your health care provider will ask questions about your symptoms and any stressful life events.Your health care provider will also ask about your medical history and may order blood tests or other tests. Certain medical conditions and medicine can cause physical symptoms similar to stress. Mental illness can cause emotional symptoms and unhealthy behaviors similar to stress. Your health care provider may refer you to a mental health professional for further evaluation. How is this treated? Stress management is the recommended treatment for stress.The goals of stress management are reducing stressful life events and coping with stress in healthy ways. Techniques for reducing stressful life events include the following:  Stress identification. Self-monitor for stress and identify what causes stress for you. These skills may help you to avoid some stressful events.  Time management. Set your priorities, keep a calendar of events, and learn to say "no." These tools can help you avoid making too many commitments.  Techniques for coping with stress include the following:  Rethinking the problem. Try to think realistically about stressful events rather than ignoring them or overreacting. Try to find the positives in a stressful situation rather than focusing on the negatives.  Exercise. Physical exercise can release both physical and emotional tension. The key is to find a form of exercise you enjoy and do it regularly.  Relaxation techniques. These relax the body and  mind. Examples include yoga, meditation, tai chi, biofeedback, deep breathing, progressive muscle relaxation, listening to music, being out in nature, journaling, and other hobbies. Again, the key is to find  one or more that you enjoy and can do regularly.  Healthy lifestyle. Eat a balanced diet, get plenty of sleep, and do not smoke. Avoid using alcohol or drugs to relax.  Strong support network. Spend time with family, friends, or other people you enjoy being around.Express your feelings and talk things over with someone you trust.  Counseling or talktherapy with a mental health professional may be helpful if you are having difficulty managing stress on your own. Medicine is typically not recommended for the treatment of stress.Talk to your health care provider if you think you need medicine for symptoms of stress. Follow these instructions at home:  Keep all follow-up visits as directed by your health care provider.  Take all medicines as directed by your health care provider. Contact a health care provider if:  Your symptoms get worse or you start having new symptoms.  You feel overwhelmed by your problems and can no longer manage them on your own. Get help right away if:  You feel like hurting yourself or someone else. This information is not intended to replace advice given to you by your health care provider. Make sure you discuss any questions you have with your health care provider. Document Released: 01/06/2001 Document Revised: 12/19/2015 Document Reviewed: 03/07/2013 Elsevier Interactive Patient Education  2017 [employer]  

## 2017-03-11 ENCOUNTER — Encounter: Payer: Self-pay | Admitting: Nurse Practitioner

## 2017-03-11 LAB — CMP14+EGFR
A/G RATIO: 1.9 (ref 1.2–2.2)
ALBUMIN: 4.4 g/dL (ref 3.6–4.8)
ALT: 9 IU/L (ref 0–32)
AST: 13 IU/L (ref 0–40)
Alkaline Phosphatase: 88 IU/L (ref 39–117)
BILIRUBIN TOTAL: 0.4 mg/dL (ref 0.0–1.2)
BUN / CREAT RATIO: 10 — AB (ref 12–28)
BUN: 9 mg/dL (ref 8–27)
CHLORIDE: 105 mmol/L (ref 96–106)
CO2: 22 mmol/L (ref 20–29)
Calcium: 9.9 mg/dL (ref 8.7–10.3)
Creatinine, Ser: 0.88 mg/dL (ref 0.57–1.00)
GFR calc non Af Amer: 72 mL/min/{1.73_m2} (ref >59)
GFR, EST AFRICAN AMERICAN: 83 mL/min/{1.73_m2} (ref >59)
GLOBULIN, TOTAL: 2.3 g/dL (ref 1.5–4.5)
GLUCOSE: 102 mg/dL — AB (ref 65–99)
POTASSIUM: 4.6 mmol/L (ref 3.5–5.2)
Sodium: 144 mmol/L (ref 134–144)
TOTAL PROTEIN: 6.7 g/dL (ref 6.0–8.5)

## 2017-03-11 LAB — LIPID PANEL
Chol/HDL Ratio: 3.4 ratio (ref 0.0–4.4)
Cholesterol, Total: 123 mg/dL (ref 100–199)
HDL: 36 mg/dL — ABNORMAL LOW (ref >39)
LDL Calculated: 60 mg/dL (ref 0–99)
Triglycerides: 133 mg/dL (ref 0–149)
VLDL CHOLESTEROL CAL: 27 mg/dL (ref 5–40)

## 2017-03-23 DIAGNOSIS — R220 Localized swelling, mass and lump, head: Secondary | ICD-10-CM | POA: Insufficient documentation

## 2017-04-12 ENCOUNTER — Telehealth: Payer: Self-pay | Admitting: Nurse Practitioner

## 2017-04-13 NOTE — Telephone Encounter (Signed)
Spoke with pharmacist and corrected dose. Patient notified

## 2017-04-29 ENCOUNTER — Telehealth: Payer: Self-pay | Admitting: Pediatrics

## 2017-05-11 ENCOUNTER — Ambulatory Visit (INDEPENDENT_AMBULATORY_CARE_PROVIDER_SITE_OTHER): Payer: Medicare HMO | Admitting: Nurse Practitioner

## 2017-05-11 ENCOUNTER — Encounter: Payer: Self-pay | Admitting: Nurse Practitioner

## 2017-05-11 VITALS — BP 177/78 | HR 68 | Temp 97.3°F | Ht 63.0 in | Wt 200.0 lb

## 2017-05-11 DIAGNOSIS — F411 Generalized anxiety disorder: Secondary | ICD-10-CM | POA: Diagnosis not present

## 2017-05-11 MED ORDER — ALPRAZOLAM 1 MG PO TABS: ORAL_TABLET | ORAL | 0 refills | Status: DC

## 2017-05-11 NOTE — Patient Instructions (Signed)
Stress and Stress Management Stress is a normal reaction to life events. It is what you feel when life demands more than you are used to or more than you can handle. Some stress can be useful. For example, the stress reaction can help you catch the last bus of the day, study for a test, or meet a deadline at work. But stress that occurs too often or for too long can cause problems. It can affect your emotional health and interfere with relationships and normal daily activities. Too much stress can weaken your immune system and increase your risk for physical illness. If you already have a medical problem, stress can make it worse. What are the causes? All sorts of life events may cause stress. An event that causes stress for one person may not be stressful for another person. Major life events commonly cause stress. These may be positive or negative. Examples include losing your job, moving into a new home, getting married, having a baby, or losing a loved one. Less obvious life events may also cause stress, especially if they occur day after day or in combination. Examples include working long hours, driving in traffic, caring for children, being in debt, or being in a difficult relationship. What are the signs or symptoms? Stress may cause emotional symptoms including, the following:  Anxiety. This is feeling worried, afraid, on edge, overwhelmed, or out of control.  Anger. This is feeling irritated or impatient.  Depression. This is feeling sad, down, helpless, or guilty.  Difficulty focusing, remembering, or making decisions.  Stress may cause physical symptoms, including the following:  Aches and pains. These may affect your head, neck, back, stomach, or other areas of your body.  Tight muscles or clenched jaw.  Low energy or trouble sleeping.  Stress may cause unhealthy behaviors, including the following:  Eating to feel better (overeating) or skipping meals.  Sleeping too little,  too much, or both.  Working too much or putting off tasks (procrastination).  Smoking, drinking alcohol, or using drugs to feel better.  How is this diagnosed? Stress is diagnosed through an assessment by your health care provider. Your health care provider will ask questions about your symptoms and any stressful life events.Your health care provider will also ask about your medical history and may order blood tests or other tests. Certain medical conditions and medicine can cause physical symptoms similar to stress. Mental illness can cause emotional symptoms and unhealthy behaviors similar to stress. Your health care provider may refer you to a mental health professional for further evaluation. How is this treated? Stress management is the recommended treatment for stress.The goals of stress management are reducing stressful life events and coping with stress in healthy ways. Techniques for reducing stressful life events include the following:  Stress identification. Self-monitor for stress and identify what causes stress for you. These skills may help you to avoid some stressful events.  Time management. Set your priorities, keep a calendar of events, and learn to say "no." These tools can help you avoid making too many commitments.  Techniques for coping with stress include the following:  Rethinking the problem. Try to think realistically about stressful events rather than ignoring them or overreacting. Try to find the positives in a stressful situation rather than focusing on the negatives.  Exercise. Physical exercise can release both physical and emotional tension. The key is to find a form of exercise you enjoy and do it regularly.  Relaxation techniques. These relax the body and  mind. Examples include yoga, meditation, tai chi, biofeedback, deep breathing, progressive muscle relaxation, listening to music, being out in nature, journaling, and other hobbies. Again, the key is to find  one or more that you enjoy and can do regularly.  Healthy lifestyle. Eat a balanced diet, get plenty of sleep, and do not smoke. Avoid using alcohol or drugs to relax.  Strong support network. Spend time with family, friends, or other people you enjoy being around.Express your feelings and talk things over with someone you trust.  Counseling or talktherapy with a mental health professional may be helpful if you are having difficulty managing stress on your own. Medicine is typically not recommended for the treatment of stress.Talk to your health care provider if you think you need medicine for symptoms of stress. Follow these instructions at home:  Keep all follow-up visits as directed by your health care provider.  Take all medicines as directed by your health care provider. Contact a health care provider if:  Your symptoms get worse or you start having new symptoms.  You feel overwhelmed by your problems and can no longer manage them on your own. Get help right away if:  You feel like hurting yourself or someone else. This information is not intended to replace advice given to you by your health care provider. Make sure you discuss any questions you have with your health care provider. Document Released: 01/06/2001 Document Revised: 12/19/2015 Document Reviewed: 03/07/2013 Elsevier Interactive Patient Education  2017 [employer]  

## 2017-05-11 NOTE — Progress Notes (Signed)
   Subjective:    Patient ID: [name], female    DOB: 1956-01-08, 61 y.o.   MRN: [id number]  HPI Patient comes in today to discuss her depression and anxiety. She was seen on 03/10/17 for follow up. At that time she her depression score was 16 which was an improvement from 21 at previous visit. But she said her stress level was really high with lots of personal things going on that she would not elaborate on. We increased her lexapro and rx for xanax 1mg  BID with occasional use of 2 1/2  just until lexapro started working. She called back on  04/12/17 needing refill on xanax because she had been taking 2 1/2  and was almost out. I went ahead and asked refill and was told NTBS to discuss. Today she says that she needs to take xanax 1mg  2 1/2 daily because she is trying to settle her sisters estate and it is just causing stress trying to get things settled.   Review of Systems  Constitutional: Negative.   Respiratory: Negative.   Cardiovascular: Negative.   Neurological: Negative.   Psychiatric/Behavioral: The patient is nervous/anxious.   All other systems reviewed and are negative.      Objective:   Physical Exam  Constitutional: She is oriented to person, place, and time. She appears well-developed and well-nourished. No distress.  Cardiovascular: Normal rate and regular rhythm.   Pulmonary/Chest: Effort normal and breath sounds normal.  Neurological: She is alert and oriented to person, place, and time.  Skin: Skin is warm.  Psychiatric: She has a normal mood and affect. Her behavior is normal. Judgment and thought content normal.  Tearful during exam   BP (!) 177/78   Pulse 68   Temp (!) 97.3 F (36.3 C) (Oral)   Ht 5\' 3"  (1.6 m)   Wt 200 lb (90.7 kg)   LMP 10/17/2005   BMI 35.43 kg/m      Assessment & Plan:   1. Generalized anxiety disorder    Meds ordered this encounter  Medications  . ALPRAZolam (XANAX) 1 MG tablet    Sig: 1 po BID and may take an extra 1/2  tablet prn    Dispense:  75 tablet    Refill:  0    Order Specific Question:   Supervising Provider    Answer:   Eustaquio Maize [[id number]]   Patient was told that she need sto not depend on meds during stressful events Stress management discussed Current xanax dose is only temporary- last rx for extra 1/2 daily rto prn- tried to explain that Dr. Evette Doffing is her PCP and really should be the one to deal with this, and she denied increase in xanax the last time she saw her.  [name], FNP

## 2017-05-20 NOTE — Telephone Encounter (Signed)
Patient seen on 10/16 with [name], FNP

## 2017-06-11 ENCOUNTER — Other Ambulatory Visit: Payer: Self-pay | Admitting: Nurse Practitioner

## 2017-06-11 DIAGNOSIS — F411 Generalized anxiety disorder: Secondary | ICD-10-CM

## 2017-06-11 NOTE — Telephone Encounter (Signed)
Please call in xanax with 1 refills 

## 2017-06-14 ENCOUNTER — Other Ambulatory Visit: Payer: Self-pay | Admitting: Nurse Practitioner

## 2017-06-14 DIAGNOSIS — F411 Generalized anxiety disorder: Secondary | ICD-10-CM

## 2017-06-14 NOTE — Telephone Encounter (Signed)
Rx called to pharmacy

## 2017-06-14 NOTE — Telephone Encounter (Signed)
Last seen 05/11/17  MMM  If approved route to nurse to call into [facility]

## 2017-06-14 NOTE — Telephone Encounter (Signed)
Rx Called to [facility]

## 2017-06-14 NOTE — Telephone Encounter (Signed)
Xanax was approved  To be called in on nov 16

## 2017-07-08 ENCOUNTER — Encounter: Payer: Self-pay | Admitting: Family Medicine

## 2017-07-08 ENCOUNTER — Ambulatory Visit (INDEPENDENT_AMBULATORY_CARE_PROVIDER_SITE_OTHER): Payer: Medicare HMO | Admitting: Family Medicine

## 2017-07-08 VITALS — BP 158/80 | HR 83 | Temp 98.8°F | Ht 63.0 in | Wt 205.0 lb

## 2017-07-08 DIAGNOSIS — J0100 Acute maxillary sinusitis, unspecified: Secondary | ICD-10-CM | POA: Diagnosis not present

## 2017-07-08 DIAGNOSIS — H9202 Otalgia, left ear: Secondary | ICD-10-CM | POA: Diagnosis not present

## 2017-07-08 MED ORDER — AMOXICILLIN-POT CLAVULANATE 875-125 MG PO TABS: 1.0000 | ORAL_TABLET | Freq: Two times a day (BID) | ORAL | 0 refills | Status: DC

## 2017-07-08 NOTE — Patient Instructions (Signed)
I have prescribed you Augmentin to take twice a day for the next 10 days.  If you develop any other worrisome signs or symptoms that we discussed, please seek immediate medical attention.   - Get plenty of rest and drink plenty of fluids. - Try to breathe moist air. Use a cold mist humidifier. - Consume warm fluids (soup or tea) to provide relief for a stuffy nose and to loosen phlegm. - For nasal stuffiness, try saline nasal spray or a Neti Pot. Afrin nasal spray can also be used but this product should not be used longer than 3 days or it will cause rebound nasal stuffiness (worsening nasal congestion). - For sore throat pain relief: suck on throat lozenges, hard candy or popsicles; gargle with warm salt water (1/4 tsp. salt per 8 oz. of water); and eat soft, bland foods. - Eat a well-balanced diet. If you cannot, ensure you are getting enough nutrients by taking a daily multivitamin. - Avoid dairy products, as they can thicken phlegm. - Avoid alcohol, as it impairs your body's immune system.  CONTACT YOUR DOCTOR IF YOU EXPERIENCE ANY OF THE FOLLOWING: - High fever - Ear pain - Sinus-type headache - Unusually severe cold symptoms - Cough that gets worse while other cold symptoms improve - Flare up of any chronic lung problem, such as asthma - Your symptoms persist longer than 2 weeks  Sinusitis, Adult Sinusitis is soreness and inflammation of your sinuses. Sinuses are hollow spaces in the bones around your face. Your sinuses are located:  Around your eyes.  In the middle of your forehead.  Behind your nose.  In your cheekbones.  Your sinuses and nasal passages are lined with a stringy fluid (mucus). Mucus normally drains out of your sinuses. When your nasal tissues become inflamed or swollen, the mucus can become trapped or blocked so air cannot flow through your sinuses. This allows bacteria, viruses, and funguses to grow, which leads to infection. Sinusitis can develop quickly  and last for 7?10 days (acute) or for more than 12 weeks (chronic). Sinusitis often develops after a cold. What are the causes? This condition is caused by anything that creates swelling in the sinuses or stops mucus from draining, including:  Allergies.  Asthma.  Bacterial or viral infection.  Abnormally shaped bones between the nasal passages.  Nasal growths that contain mucus (nasal polyps).  Narrow sinus openings.  Pollutants, such as chemicals or irritants in the air.  A foreign object stuck in the nose.  A fungal infection. This is rare.  What increases the risk? The following factors may make you more likely to develop this condition:  Having allergies or asthma.  Having had a recent cold or respiratory tract infection.  Having structural deformities or blockages in your nose or sinuses.  Having a weak immune system.  Doing a lot of swimming or diving.  Overusing nasal sprays.  Smoking.  What are the signs or symptoms? The main symptoms of this condition are pain and a feeling of pressure around the affected sinuses. Other symptoms include:  Upper toothache.  Earache.  Headache.  Bad breath.  Decreased sense of smell and taste.  A cough that may get worse at night.  Fatigue.  Fever.  Thick drainage from your nose. The drainage is often green and it may contain pus (purulent).  Stuffy nose or congestion.  Postnasal drip. This is when extra mucus collects in the throat or back of the nose.  Swelling and warmth over  the affected sinuses.  Sore throat.  Sensitivity to light.  How is this diagnosed? This condition is diagnosed based on symptoms, a medical history, and a physical exam. To find out if your condition is acute or chronic, your health care provider may:  Look in your nose for signs of nasal polyps.  Tap over the affected sinus to check for signs of infection.  View the inside of your sinuses using an imaging device that has a  light attached (endoscope).  If your health care provider suspects that you have chronic sinusitis, you may also:  Be tested for allergies.  Have a sample of mucus taken from your nose (nasal culture) and checked for bacteria.  Have a mucus sample examined to see if your sinusitis is related to an allergy.  If your sinusitis does not respond to treatment and it lasts longer than 8 weeks, you may have an MRI or CT scan to check your sinuses. These scans also help to determine how severe your infection is. In rare cases, a bone biopsy may be done to rule out more serious types of fungal sinus disease. How is this treated? Treatment for sinusitis depends on the cause and whether your condition is chronic or acute. If a virus is causing your sinusitis, your symptoms will go away on their own within 10 days. You may be given medicines to relieve your symptoms, including:  Topical nasal decongestants. They shrink swollen nasal passages and let mucus drain from your sinuses.  Antihistamines. These drugs block inflammation that is triggered by allergies. This can help to ease swelling in your nose and sinuses.  Topical nasal corticosteroids. These are nasal sprays that ease inflammation and swelling in your nose and sinuses.  Nasal saline washes. These rinses can help to get rid of thick mucus in your nose.  If your condition is caused by bacteria, you will be given an antibiotic medicine. If your condition is caused by a fungus, you will be given an antifungal medicine. Surgery may be needed to correct underlying conditions, such as narrow nasal passages. Surgery may also be needed to remove polyps. Follow these instructions at home: Medicines  Take, use, or apply over-the-counter and prescription medicines only as told by your health care provider. These may include nasal sprays.  If you were prescribed an antibiotic medicine, take it as told by your health care provider. Do not stop taking  the antibiotic even if you start to feel better. Hydrate and Humidify  Drink enough water to keep your urine clear or pale yellow. Staying hydrated will help to thin your mucus.  Use a cool mist humidifier to keep the humidity level in your home above 50%.  Inhale steam for 10-15 minutes, 3-4 times a day or as told by your health care provider. You can do this in the bathroom while a hot shower is running.  Limit your exposure to cool or dry air. Rest  Rest as much as possible.  Sleep with your head raised (elevated).  Make sure to get enough sleep each night. General instructions  Apply a warm, moist washcloth to your face 3-4 times a day or as told by your health care provider. This will help with discomfort.  Wash your hands often with soap and water to reduce your exposure to viruses and other germs. If soap and water are not available, use hand sanitizer.  Do not smoke. Avoid being around people who are smoking (secondhand smoke).  Keep all follow-up visits  as told by your health care provider. This is important. Contact a health care provider if:  You have a fever.  Your symptoms get worse.  Your symptoms do not improve within 10 days. Get help right away if:  You have a severe headache.  You have persistent vomiting.  You have pain or swelling around your face or eyes.  You have vision problems.  You develop confusion.  Your neck is stiff.  You have trouble breathing. This information is not intended to replace advice given to you by your health care provider. Make sure you discuss any questions you have with your health care provider. Document Released: 07/13/2005 Document Revised: 03/08/2016 Document Reviewed: 05/08/2015 Elsevier Interactive Patient Education  2017 [employer].

## 2017-07-08 NOTE — Progress Notes (Signed)
Subjective: [name] symptoms PCP: [name], FNP [id number] [name] is Tracy 61 y.o. female presenting to clinic today for:  1. Cold symptoms  Patient reports sinus drainage, sinus pressure, nasal congestion, dry cough and left ear fullness that started 2-3 days ago.  She notes Tracy yellow gray discharge from her nose that is thick.  She is not able to smell well nor taste at this time so cannot identify whether or not there is Tracy foul odor or taste to it.  She notes associated subjective fevers and chills.  Her grandson was sick recently as well.  Denies productive cough, hemoptysis, SOB, dizziness, rash, nausea, vomiting, diarrhea, myalgia, recent travel.  Patient has used Claritin with little relief of symptoms.  She is also been using Aleve for headache which she does find helpful.  She has Tracy history of COPD.  Current tobacco use/ exposure.  ROS: Per HPI  Allergies  Allergen Reactions  . Food Other (See Comments)    Nutmeg= difficulty breathing   Past Medical History:  Diagnosis Date  . Anxiety   . Breast cancer ([place]) 06/30/11   inv ductal, ER/PR +, her-2 -  . CAD (coronary artery disease)    NSTEMI 4/08: OM3 occluded (PCI unsuccessful), dRCA 95% => BMS, inf HK, EF 50%;   b. MV 11/12:  [place] ischemia => c.  [place] 11/12:  dLAD 70%, OM1 70-80%, then occluded (no change from 2008), dOM filled L->L collats, mRCA stent occluded, dRCA filled L->R collats, EF 55-65% => med Rx (consider PCI of RCA if refractory angina)  . Carotid stenosis    dopplers 5/08: 0-30% bilateral; 10/12: 0-39% B/L ICA => f/u 04/2013  . Chronic kidney disease   . COPD (chronic obstructive pulmonary disease) ([place])   . Depression   . Depression with anxiety   . GERD (gastroesophageal reflux disease)    OTC acid reducer prn  . Headache(784.0)    sinus; occ. migraines  . Hx of radiation therapy 08/24/11 to 10/07/11   L breast  . Hyperlipidemia   . Hypertension    under control; has been on med. x 4 yrs.  .  Myocardial infarction ([place])   . Stable angina ([place])    Tracy. med Rx after cath 05/2011  . Stress incontinence, female     Current Outpatient Medications:  .  ALPRAZolam (XANAX) 1 MG tablet, TAKE 1 TABLET BY MOUTH TWICE DAILY AND MAY  TAKE  AN  EXTRA  1/2  TABLET  AS  NEEDED, Disp: 75 tablet, Rfl: 1 .  aspirin 325 MG tablet, Take 325 mg by mouth daily.  , Disp: , Rfl:  .  atorvastatin (LIPITOR) 80 MG tablet, Take 1 tablet (80 mg total) by mouth daily., Disp: 90 tablet, Rfl: 1 .  blood glucose meter kit and supplies KIT, Dispense based on pt and ins preference. Use up to QDas directed. (FOR ICD-10 E11.9)., Disp: 1 each, Rfl: 0 .  Cholecalciferol (VITAMIN D3) 3000 UNITS TABS, Take 1 tablet by mouth daily., Disp: , Rfl:  .  escitalopram (LEXAPRO) 20 MG tablet, Take 1 tablet (20 mg total) by mouth daily., Disp: 90 tablet, Rfl: 1 .  fish oil-omega-3 fatty acids 1000 MG capsule, Take 2 g by mouth 2 (two) times daily. , Disp: , Rfl:  .  fluticasone furoate-vilanterol (BREO ELLIPTA) 100-25 MCG/INH AEPB, Inhale 1 puff into the lungs daily., Disp: 60 each, Rfl: 3 .  isosorbide mononitrate (IMDUR) 30 MG 24 hr tablet, Take 0.5 tablets (15  mg total) by mouth daily., Disp: 90 tablet, Rfl: 2 .  lisinopril (PRINIVIL,ZESTRIL) 20 MG tablet, Take 1 tablet (20 mg total) by mouth daily., Disp: 90 tablet, Rfl: 1 .  metFORMIN (GLUCOPHAGE) 500 MG tablet, Take 1 tablet (500 mg total) by mouth 2 (two) times daily with Tracy meal., Disp: 180 tablet, Rfl: 1 .  metoprolol tartrate (LOPRESSOR) 25 MG tablet, Take 1 tablet (25 mg total) by mouth 2 (two) times daily., Disp: 180 tablet, Rfl: 1 .  pramipexole (MIRAPEX) 0.5 MG tablet, Take 1 tablet (0.5 mg total) by mouth 2 (two) times daily., Disp: 180 tablet, Rfl: 1 .  vitamin B-12 (CYANOCOBALAMIN) 1000 MCG tablet, Take 1,000 mcg by mouth daily., Disp: , Rfl:  Social History   Socioeconomic History  . Marital status: Single    Spouse name: Not on file  . Number of children: Not on  file  . Years of education: Not on file  . Highest education level: Not on file  Social Needs  . Financial resource strain: Not on file  . Food insecurity - worry: Not on file  . Food insecurity - inability: Not on file  . Transportation needs - medical: Not on file  . Transportation needs - non-medical: Not on file  Occupational History  . Not on file  Tobacco Use  . Smoking status: Current Every Day Smoker    Packs/day: 1.00    Years: 35.00    Pack years: 35.00    Types: Cigarettes  . Smokeless tobacco: Never Used  Substance and Sexual Activity  . Alcohol use: Yes    Alcohol/week: 0.6 oz    Types: 1 Glasses of wine per week    Comment: occasionally  . Drug use: No  . Sexual activity: No    Birth control/protection: Post-menopausal  Other Topics Concern  . Not on file  Social History Narrative  . Not on file   Family History  Problem Relation Age of Onset  . Heart disease Mother   . Heart disease Father   . Cancer Maternal Aunt        pt unaware of what kind  . Cancer Maternal Grandmother        ovarian  . Heart attack Sister   . Bipolar disorder Sister     Objective: Office vital signs reviewed. BP (!) 158/80   Pulse 83   Temp 98.8 F (37.1 C) (Oral)   Ht 5' 3"  (1.6 m)   Wt 205 lb (93 kg)   LMP 10/17/2005   SpO2 92%   BMI 36.31 kg/m   Physical Examination:  General: Awake, alert, tired appearing, No acute distress HEENT: + frontal and maxillary sinus TTP; large cystic mass appreciated along the right side of patient's face.    Neck: No masses palpated. No lymphadenopathy    Ears: R Tympanic membranes intact, normal light reflex, no erythema, no bulging; L TM with slight erythema appreciated at the base.  TM is slightly opaque with Tracy dulled light reflex.  No overt purulence appreciated behind the tympanic membrane.    Eyes: PERRLA, extraocular membranes intact, sclera white, no discharge    Nose: nasal turbinates moist, opaque  nasal discharge     Throat: moist mucus membranes, no erythema, no tonsillar exudate.  Airway is patent Cardio: regular rate and rhythm, S1S2 heard, no murmurs appreciated Pulm: clear to auscultation bilaterally, no wheezes, rhonchi or rales; normal work of breathing on room air  Assessment/ Plan: 61 y.o. female   1.  Acute non-recurrent maxillary sinusitis Given reports of purulent discharge from the nares, subjective fevers and chills, will treat as acute bacterial sinusitis.  Augmentin 875 p.o. twice daily by 10 days was prescribed today.  She does have some financial restrictions at this time and I discussed with her that if this is too expensive for her she may call and I will change the antibiotic to something else.  There are not very many options on the [facility] $4 list for this patient.  Given her cardiovascular history, would be hesitant to give her Tracy flora quinolone.  That this is an option.  Amoxicillin is also an option though does not do as good of Tracy job as Augmentin.  Home care instructions were reviewed with the patient.  She voiced good understanding. Strict return precautions and reasons for emergent evaluation in the emergency department review with patient.  She voiced understanding and will follow-up as needed.  2. Left ear pain No evidence of otitis media at this time.  I suspect that she is having pressure from sinus congestion.   Meds ordered this encounter  Medications  . amoxicillin-clavulanate (AUGMENTIN) 875-125 MG tablet    Sig: Take 1 tablet by mouth 2 (two) times daily.    Dispense:  20 tablet    Refill:  [place], DO [place] [phone]

## 2017-07-12 ENCOUNTER — Encounter: Payer: Self-pay | Admitting: Family Medicine

## 2017-07-12 ENCOUNTER — Ambulatory Visit (INDEPENDENT_AMBULATORY_CARE_PROVIDER_SITE_OTHER): Payer: Medicare HMO

## 2017-07-12 ENCOUNTER — Telehealth: Payer: Self-pay | Admitting: Family Medicine

## 2017-07-12 ENCOUNTER — Ambulatory Visit (INDEPENDENT_AMBULATORY_CARE_PROVIDER_SITE_OTHER): Payer: Medicare HMO | Admitting: Family Medicine

## 2017-07-12 VITALS — BP 138/76 | HR 72 | Temp 97.8°F | Ht 63.0 in | Wt 199.0 lb

## 2017-07-12 DIAGNOSIS — R0602 Shortness of breath: Secondary | ICD-10-CM

## 2017-07-12 DIAGNOSIS — R0989 Other specified symptoms and signs involving the circulatory and respiratory systems: Secondary | ICD-10-CM | POA: Diagnosis not present

## 2017-07-12 DIAGNOSIS — J441 Chronic obstructive pulmonary disease with (acute) exacerbation: Secondary | ICD-10-CM

## 2017-07-12 MED ORDER — METHYLPREDNISOLONE ACETATE 80 MG/ML IJ SUSP
80.0000 mg | Freq: Once | INTRAMUSCULAR | Status: AC
  Administered 2017-07-12: 80 mg via INTRAMUSCULAR

## 2017-07-12 MED ORDER — PREDNISONE 20 MG PO TABS: 40.0000 mg | ORAL_TABLET | Freq: Every day | ORAL | 0 refills | Status: AC

## 2017-07-12 MED ORDER — ALBUTEROL SULFATE HFA 108 (90 BASE) MCG/ACT IN AERS: 2.0000 | INHALATION_SPRAY | Freq: Four times a day (QID) | RESPIRATORY_TRACT | 0 refills | Status: DC | PRN

## 2017-07-12 MED ORDER — DOXYCYCLINE HYCLATE 100 MG PO TABS: 100.0000 mg | ORAL_TABLET | Freq: Two times a day (BID) | ORAL | 0 refills | Status: AC

## 2017-07-12 NOTE — Patient Instructions (Signed)
I think that your upper respiratory illness has now progressed down into your lungs.  We had a chest x-ray done today which on my personal review did not demonstrate any pneumonias.  I am treating you for COPD exacerbation.  You got a DuoNeb inhalation treatment today in office.  I would like you to use the albuterol inhaler that I have prescribed 2 puffs every 6 hours scheduled for the next 2 days.  You may then start spacing it out to 2 puffs every 6 hours if needed.  I have also prescribed you doxycycline to take twice a day with a meal for the next 7 days to cover for any bacterial infection that may be contributing.  Make sure that you take this medication with meal otherwise it may make you nauseous.  I have sent in prednisone for you to take 40 mg by mouth every morning for the next 5 days.  You were given your dose today by injection.  You do not have to start this medication until tomorrow.  You were also given the [facility] information and a prescription discount card today to help with paying for these medications.  If you develop worsening shortness of breath, cough up blood, have high fevers, have chest pain, please seek immediate medical attention in the emergency department.

## 2017-07-12 NOTE — Addendum Note (Signed)
Addended byCarrolyn Leigh on: 07/12/2017 03:59 PM   Modules accepted: Orders

## 2017-07-12 NOTE — Progress Notes (Signed)
Subjective: CC: persistent congestion PCP: [name], FNP TDH:RCBUL [name] is Tracy 61 y.o. female presenting to clinic today for:  1. Congestion Patient was seen on 07/08/2017 for symptoms concerning for acute bacterial sinusitis.  She was prescribed Augmentin 875.  She reports that she started having shortness of breath and chest congestion that started the day following our last appointment.  Reports productive cough and dyspnea on exertion.  Denies hemoptysis.  Continues to have sinus congestion, rhinorrhea, sinus pressure, headache.  Denies rash, nausea, vomiting, diarrhea, fevers, chills, myalgia, sick contacts, recent travel.  Patient has used Augmentin with little relief of symptoms.  She has Tracy history of COPD.  She actively uses tobacco.  ROS: Per HPI  Allergies  Allergen Reactions  . Food Other (See Comments)    Nutmeg= difficulty breathing   Past Medical History:  Diagnosis Date  . Anxiety   . Breast cancer ([place]) 06/30/11   inv ductal, ER/PR +, her-2 -  . CAD (coronary artery disease)    NSTEMI 4/08: OM3 occluded (PCI unsuccessful), dRCA 95% => BMS, inf HK, EF 50%;   b. MV 11/12:  IL ischemia => c.  Rafael Capo 11/12:  dLAD 70%, OM1 70-80%, then occluded (no change from 2008), dOM filled L->L collats, mRCA stent occluded, dRCA filled L->R collats, EF 55-65% => med Rx (consider PCI of RCA if refractory angina)  . Carotid stenosis    dopplers 5/08: 0-30% bilateral; 10/12: 0-39% B/L ICA => f/u 04/2013  . Chronic kidney disease   . COPD (chronic obstructive pulmonary disease) ([place])   . Depression   . Depression with anxiety   . GERD (gastroesophageal reflux disease)    OTC acid reducer prn  . Headache(784.0)    sinus; occ. migraines  . Hx of radiation therapy 08/24/11 to 10/07/11   L breast  . Hyperlipidemia   . Hypertension    under control; has been on med. x 4 yrs.  . Myocardial infarction ([place])   . Stable angina ([place])    Tracy. med Rx after cath 05/2011  . Stress  incontinence, female     Current Outpatient Medications:  .  ALPRAZolam (XANAX) 1 MG tablet, TAKE 1 TABLET BY MOUTH TWICE DAILY AND MAY  TAKE  AN  EXTRA  1/2  TABLET  AS  NEEDED, Disp: 75 tablet, Rfl: 1 .  aspirin 325 MG tablet, Take 325 mg by mouth daily.  , Disp: , Rfl:  .  atorvastatin (LIPITOR) 80 MG tablet, Take 1 tablet (80 mg total) by mouth daily., Disp: 90 tablet, Rfl: 1 .  blood glucose meter kit and supplies KIT, Dispense based on pt and ins preference. Use up to QDas directed. (FOR ICD-10 E11.9)., Disp: 1 each, Rfl: 0 .  Cholecalciferol (VITAMIN D3) 3000 UNITS TABS, Take 1 tablet by mouth daily., Disp: , Rfl:  .  escitalopram (LEXAPRO) 20 MG tablet, Take 1 tablet (20 mg total) by mouth daily., Disp: 90 tablet, Rfl: 1 .  fish oil-omega-3 fatty acids 1000 MG capsule, Take 2 g by mouth 2 (two) times daily. , Disp: , Rfl:  .  fluticasone furoate-vilanterol (BREO ELLIPTA) 100-25 MCG/INH AEPB, Inhale 1 puff into the lungs daily., Disp: 60 each, Rfl: 3 .  isosorbide mononitrate (IMDUR) 30 MG 24 hr tablet, Take 0.5 tablets (15 mg total) by mouth daily., Disp: 90 tablet, Rfl: 2 .  lisinopril (PRINIVIL,ZESTRIL) 20 MG tablet, Take 1 tablet (20 mg total) by mouth daily., Disp: 90 tablet, Rfl: 1 .  metFORMIN (GLUCOPHAGE) 500 MG tablet, Take 1 tablet (500 mg total) by mouth 2 (two) times daily with Tracy meal., Disp: 180 tablet, Rfl: 1 .  metoprolol tartrate (LOPRESSOR) 25 MG tablet, Take 1 tablet (25 mg total) by mouth 2 (two) times daily., Disp: 180 tablet, Rfl: 1 .  pramipexole (MIRAPEX) 0.5 MG tablet, Take 1 tablet (0.5 mg total) by mouth 2 (two) times daily., Disp: 180 tablet, Rfl: 1 .  vitamin B-12 (CYANOCOBALAMIN) 1000 MCG tablet, Take 1,000 mcg by mouth daily., Disp: , Rfl:  Social History   Socioeconomic History  . Marital status: Single    Spouse name: Not on file  . Number of children: Not on file  . Years of education: Not on file  . Highest education level: Not on file  Social Needs    . Financial resource strain: Not on file  . Food insecurity - worry: Not on file  . Food insecurity - inability: Not on file  . Transportation needs - medical: Not on file  . Transportation needs - non-medical: Not on file  Occupational History  . Not on file  Tobacco Use  . Smoking status: Current Every Day Smoker    Packs/day: 1.00    Years: 35.00    Pack years: 35.00    Types: Cigarettes  . Smokeless tobacco: Never Used  Substance and Sexual Activity  . Alcohol use: Yes    Alcohol/week: 0.6 oz    Types: 1 Glasses of wine per week    Comment: occasionally  . Drug use: No  . Sexual activity: No    Birth control/protection: Post-menopausal  Other Topics Concern  . Not on file  Social History Narrative  . Not on file   Family History  Problem Relation Age of Onset  . Heart disease Mother   . Heart disease Father   . Cancer Maternal Aunt        pt unaware of what kind  . Cancer Maternal Grandmother        ovarian  . Heart attack Sister   . Bipolar disorder Sister     Objective: Office vital signs reviewed. BP 138/76   Pulse 72   Temp 97.8 F (36.6 C) (Oral)   Ht 5' 3"  (1.6 m)   Wt 199 lb (90.3 kg)   LMP 10/17/2005   SpO2 92%   BMI 35.25 kg/m   Physical Examination:  General: Awake, alert, tired appearing, No acute distress HEENT: Normal    Eyes: PERRLA, extraocular membranes intact, sclera white    Nose: nasal turbinates moist, clear nasal discharge    Throat: moist mucus membranes, no erythema, no tonsillar exudate.  Airway is patent Cardio: regular rate and rhythm, S1S2 heard, no murmurs appreciated Pulm: Globally decreased breath sounds.  Mild expiratory wheeze appreciated.  Air movement is fair.  Normal work of breathing on room air.  Intermittently coughing during exam.  No results found.  Assessment/ Plan: 61 y.o. female   1. COPD with acute exacerbation ([place]) Patient is afebrile with normal vital signs.  She does have Tracy pulse ox of 92% on room  air.  This went down to 91% on RA after duoneb.  Her pulmonary exam was remarkable for improved air movement and mild left-sided expiratory after DuoNeb.  Subjectively, she did feel that she was breathing better after nebulizer.  Chest x-ray was obtained and on personal view did not demonstrate any overt pulmonary infiltrates to suggest pneumonia.  Formal review by radiology is still pending.  We discussed at length the importance of compliance with COPD preventative medications.  She knows that she needs to quit smoking.  She was given Tracy dose of Depo-Medrol 80 mg IM here in office.  She was prescribed prednisone burst 40 mg p.o. every morning to start tomorrow x5 days.  Doxycycline 100 mg p.o. twice daily x7 days and albuterol inhaler 2 puffs every 6 hours scheduled for the next 2 days then every 6 hours as needed.  Information for [place] was provided today for possible medication assistance. Strict return precautions and reasons for emergent evaluation in the emergency department review with patient.  She voiced understanding and will follow-up as needed. - AMB Referral to [place] Management  2. Shortness of breath - DG Chest 2 View; Future - AMB Referral to [place] Management   Orders Placed This Encounter  Procedures  . DG Chest 2 View    Standing Status:   Future    Standing Expiration Date:   09/12/2018    Order Specific Question:   Reason for Exam (SYMPTOM  OR DIAGNOSIS REQUIRED)    Answer:   shortness of breath    Order Specific Question:   Is patient pregnant?    Answer:   No    Order Specific Question:   Preferred imaging location?    Answer:   Internal    Order Specific Question:   Radiology Contrast Protocol - do NOT remove file path    Answer:   [email or web address]   Meds ordered this encounter  Medications  . albuterol (PROVENTIL HFA;VENTOLIN HFA) 108 (90 Base) MCG/ACT inhaler    Sig: Inhale 2 puffs into  the lungs every 6 (six) hours as needed for wheezing or shortness of breath.    Dispense:  1 Inhaler    Refill:  0    Please give inhaler that is best covered by patient's insurance.  . doxycycline (VIBRA-TABS) 100 MG tablet    Sig: Take 1 tablet (100 mg total) by mouth 2 (two) times daily for 7 days.    Dispense:  14 tablet    Refill:  0  . predniSONE (DELTASONE) 20 MG tablet    Sig: Take 2 tablets (40 mg total) by mouth daily with breakfast for 5 days.    Dispense:  10 tablet    Refill:  0     Gionna Polak Windell Moulding, DO [place] [phone]

## 2017-07-12 NOTE — Telephone Encounter (Signed)
There are no other alternatives for Albuterol.  Did she check w/ the pharmacy to see how much this would cost her w/ her insurance.  Unfortunately, we have no samples of this inhaler.  She may need to go to the ED if she continues to have SOB and cannot afford her inhaler.

## 2017-07-13 NOTE — Telephone Encounter (Signed)
Pt aware, she is borrowing money today to get inhaler

## 2017-07-14 ENCOUNTER — Other Ambulatory Visit: Payer: Self-pay | Admitting: Family Medicine

## 2017-07-14 DIAGNOSIS — J441 Chronic obstructive pulmonary disease with (acute) exacerbation: Secondary | ICD-10-CM

## 2017-07-15 ENCOUNTER — Other Ambulatory Visit: Payer: Self-pay

## 2017-07-15 NOTE — Patient Outreach (Signed)
[facility]) Care Management  07/15/2017  PRERNA HAROLD 04-16-1956 [id number]   Telephone Screen  Referral Date: 07/15/17 Referral Source: MD office [facility] Hassell Done, FNP) Referral Reason: " COPD, currently in exacerbation, unable to afford maintenance or threatment therapies" Insurance: [facility]    Outreach attempt # 1 to patient. No answer at present and RN CM unable to leave message as voicemail not set up.     Plan: RN CM will make outreach attempt to patient within three business days.    Enzo Montgomery, RN,BSN,CCM [place] Management Telephonic Care Management Coordinator Direct Phone: [phone] Toll Free: [phone] Fax: [phone]

## 2017-07-16 ENCOUNTER — Other Ambulatory Visit: Payer: Self-pay

## 2017-07-16 ENCOUNTER — Other Ambulatory Visit: Payer: Self-pay | Admitting: *Deleted

## 2017-07-16 NOTE — Patient Outreach (Signed)
[facility]) Care Management  07/16/2017  [name] [birth year] [id number]   Telephone Screen  Referral Date: 07/15/17 Referral Source: MD office [facility] Hassell Done, FNP) Referral Reason: " COPD, currently in exacerbation, unable to afford maintenance or threatment therapies" Insurance: [facility]   Outreach attempt #2 to patient. Spoke with patient and screening completed.  Social: Patient resides in her home alone. She vocies that she has lived alone for the past year and a half since her sister who lived with her passed away unexpectedly. She voices she is independent with ADLs/IADLs. She drives herself to MD appts. She vocies one fall within the past 3-93months with no injuries. She does not use any DME assistive devices. Patient states she has been having multiple financial issues since her sister passed away. They used to split the household bills but now all the bills fall on her. She states she is having trouble with paying utlity bills and having enough food. She desires any resources available to help assist with this.   Conditions: Per chart review, patient has PMH of COPD,tobacco abuse, anxiety, breast CA(2012), CAD, NSTEMI(2008),carotid stenosis, CKD, depression, GERD, HLD, HTN and stress incontinence. Patient states she has been dealing with recurrent acute baterial sinuitis and ear infections that aggravated her COPD. She has been to see PCP twice within the past week . She has been placed on antibiotics and steroids. She reports that sometimes her COPD gets so bad that "she can't breathe at all." She reports that MD has recommended possible oxygen. She voices that she needs further education and support in managing her COPD. She also states that she has suffered from depression for years. Depression score 13 during this call. She states she is taking med to help and feels like her depression has gotten better over the past few years.  Medication:  Patient states she takes a little over 10 meds. She voices ongoing financial issues being able to afford meds. She borrows money from her friends often to make ends meet. Patietn reports her meds are on a 90 day supply and they are all due at the same time to be refilled which creates hardship. She states that MD has tried to put her on several inhalers but they have all been too expresnive for her to afford. She states that she is currently using her sister's old and expired inhaler. She is aware that this is not safe.    Appointments: Patient voices she is followed by PCP only and is to f/u with them within 4wks or sooner if symptoms worsen.   Advance Directives: She voices she has living will. She does not recall giving medical team a copy. RN CM encouraged patient to do so.   Consent: [facility] services reviewed and discussed. Patient gave verbal consent for [facility] services.    Plan: RN CM will notify [facility] administrative assistant of case status. RN CM will send [facility] SW referral for possible resources/assistance for bills and food. RN CM will send [place] referral for polypharmacy med review and med assistance. RN CM will send [facility] community nurse referral for further in home eval/assessment of care needs and mgmt of chronic conditions.    Enzo Montgomery, RN,BSN,CCM [place] Management Telephonic Care Management Coordinator Direct Phone: [phone] Toll Free: [phone] Fax: [phone]

## 2017-07-16 NOTE — Patient Outreach (Signed)
Referral received from telephonic RN CM to schedule home visit, telephone call to pt, spoke with pt, HIPAA verified, initial home visit scheduled for next week.  [name] [facility], [place] Coordinator [phone]

## 2017-07-22 ENCOUNTER — Encounter: Payer: Self-pay | Admitting: *Deleted

## 2017-07-22 ENCOUNTER — Other Ambulatory Visit: Payer: Self-pay | Admitting: *Deleted

## 2017-07-22 NOTE — Patient Outreach (Signed)
[name]) Care Management   07/22/2017  [name] 10-14-1955 [id number]  [name] is an 61 y.o. female  Subjective: Initial home visit with pt, HIPAA verified, pt reports she lives alone and has daughter that she talks to and can assist pt if needed.  Pt states she has severe financial difficulties, receives 15$ month food stamps, cannot always afford medications, utilities and states she has received assistance from church groups and other community resources at times.  Pt states she continues to smoke and "I've been trying to cut back a little" but not ready to stop at present. Reports " I finally got my albuterol inhaler"  Objective:   Vitals:   07/22/17 1538  BP: 118/66  Pulse: 69  Resp: 18  SpO2: 94%  Weight: 193 lb (87.5 kg)  Height: 1.6 m (5' 3" )   ROS  Physical Exam  Constitutional: She is oriented to person, place, and time. She appears well-developed and well-nourished.  HENT:  Head: Normocephalic.  Neck: Normal range of motion. Neck supple.  Cardiovascular: Normal rate and regular rhythm.  Respiratory: Effort normal.  Dyspnea with exertion bil breath sounds slightly diminished  GI: Soft. Bowel sounds are normal.  Musculoskeletal: Normal range of motion. She exhibits no edema.  Neurological: She is alert and oriented to person, place, and time.  Skin: Skin is warm and dry.  Psychiatric: She has a normal mood and affect. Her behavior is normal. Judgment and thought content normal.    Encounter Medications:   Outpatient Encounter Medications as of 07/22/2017  Medication Sig  . albuterol (PROVENTIL HFA;VENTOLIN HFA) 108 (90 Base) MCG/ACT inhaler Inhale 2 puffs into the lungs every 6 (six) hours as needed for wheezing or shortness of breath.  . fluticasone furoate-vilanterol (BREO ELLIPTA) 100-25 MCG/INH AEPB Inhale 1 puff into the lungs daily.  [name] ALPRAZolam (XANAX) 1 MG tablet TAKE 1 TABLET BY MOUTH TWICE DAILY AND MAY  TAKE  AN   EXTRA  1/2  TABLET  AS  NEEDED  . aspirin 325 MG tablet Take 325 mg by mouth daily.    [name] atorvastatin (LIPITOR) 80 MG tablet Take 1 tablet (80 mg total) by mouth daily.  . blood glucose meter kit and supplies KIT Dispense based on pt and ins preference. Use up to QDas directed. (FOR ICD-10 E11.9).  [name] Cholecalciferol (VITAMIN D3) 3000 UNITS TABS Take 1 tablet by mouth daily.  [name] escitalopram (LEXAPRO) 20 MG tablet Take 1 tablet (20 mg total) by mouth daily.  . fish oil-omega-3 fatty acids 1000 MG capsule Take 2 g by mouth 2 (two) times daily.   . isosorbide mononitrate (IMDUR) 30 MG 24 hr tablet Take 0.5 tablets (15 mg total) by mouth daily.  [name] lisinopril (PRINIVIL,ZESTRIL) 20 MG tablet Take 1 tablet (20 mg total) by mouth daily.  . metFORMIN (GLUCOPHAGE) 500 MG tablet Take 1 tablet (500 mg total) by mouth 2 (two) times daily with a meal.  . metoprolol tartrate (LOPRESSOR) 25 MG tablet Take 1 tablet (25 mg total) by mouth 2 (two) times daily.  . pramipexole (MIRAPEX) 0.5 MG tablet Take 1 tablet (0.5 mg total) by mouth 2 (two) times daily.  . vitamin B-12 (CYANOCOBALAMIN) 1000 MCG tablet Take 1,000 mcg by mouth daily.   No facility-administered encounter medications on file as of 07/22/2017.     Functional Status:   In your present state of health, do you have any difficulty performing the following activities: 07/22/2017 01/29/2017  Hearing? N  N  Vision? N N  Difficulty concentrating or making decisions? N Y  Walking or climbing stairs? N N  Dressing or bathing? N N  Doing errands, shopping? N N  Preparing Food and eating ? N N  Using the Toilet? N N  In the past six months, have you accidently leaked urine? Y N  Do you have problems with loss of bowel control? N N  Managing your Medications? N N  Managing your Finances? Y N  Comment "not enough money" but able to oversee finances -  Housekeeping or managing your Housekeeping? N N  Some recent data might be hidden    Fall/Depression  Screening:    Fall Risk  07/22/2017 07/16/2017 07/08/2017  Falls in the past year? No Yes No  Number falls in past yr: - 1 -  Injury with Fall? No No -  Risk for fall due to : Medication side effect History of fall(s);Impaired vision -  Follow up Falls evaluation completed Falls evaluation completed -   PHQ 2/9 Scores 07/22/2017 07/16/2017 07/08/2017 05/11/2017 03/10/2017 01/29/2017 11/13/2016  PHQ - 2 Score 2 2 2  0 4 5 0  PHQ- 9 Score 12 13 13  - 16 21 -    Assessment:  RN CM unable to reconcile medications with EMR due to no internet signal in the home, pt has medication bottles, inhalers and reports she is taking as prescribed, RN CM sent in basket to [place] unable to reconcile medications and that pt verbalizes she cannot afford some of her medications, this is a hardship every month. RN CM called pt later multiple times after the visit to discuss medications with EMR and pt does not answer the phone. [place], is able to drive, has multiple financial issues, not willing to move anywhere else or consider other options.  Home is cluttered with a lot of things lying around and stacked up. Pt does not feel diabetes is an issue for her with Hgb AIC 5.03 March 2017.  RN CM faxed initial home visit and barrier letter to primary MD office.  THN CM Care Plan Problem One     Most Recent Value  Care Plan Problem One  Knowledge deficit related to COPD  Role Documenting the Problem One  Care Management Coordinator  Care Plan for Problem One  Active  THN Long Term Goal   Pt will demonstrate/ verbalize improved health behaviors related to COPD within 60 days.  THN Long Term Goal Start Date  07/22/17  Interventions for Problem One Long Term Goal  RN CM gave [facility] calendar, EMMI and reviewed with pt, talked with pt about importance of smoking cessation and pt not ready to quit at present, states she will think about it, RN CM reviewed energy conservation  and importance of mobility and activity to help with endurance., RN CM sent in basket to [facility] pharmacist Denyse Amass reporting pt states she cannot afford some of her medications, unable to reconcile medications today due to no internet signal in the home, pt verbalizes she has all medicaitons and taking as prescibed.   THN CM Short Term Goal #1   pt will verbalize COPD action plan/ zones within 30 days  THN CM Short Term Goal #1 Start Date  07/22/17  Interventions for Short Term Goal #1  RN CM gave pt COPD information folder including COPD action plan and reviewed with pt  THN CM Short Term Goal #2  Pt will verbalize environmental factors that exacerbate COPD within 30 days  THN CM Short Term Goal #2 Start Date  07/22/17  Interventions for Short Term Goal #2  Pt heats home with space heater and wood, RN CM reviewed effect wood burning has on respiratory condition and to use electric only if possible, reviewed dust and other allergens that exacerbate COPD and keeping environment clean.      Plan: see pt for home visit next month Collaborate with [facility] pharmacist  [name] [facility], [place] Coordinator [phone]

## 2017-07-23 ENCOUNTER — Telehealth: Payer: Self-pay | Admitting: Pharmacist

## 2017-07-23 NOTE — Patient Outreach (Signed)
[facility]) Care Management  07/23/2017  [name] [birth year] [id number]   Patient was called regarding medication assistance. Unfortunately, she did not answer the phone and her voicemail is not set up.  Plan:  Call patient back in 1-2 business days.  [name], PharmD, [place] Clinical Pharmacist [phone]

## 2017-07-26 ENCOUNTER — Other Ambulatory Visit: Payer: Self-pay | Admitting: *Deleted

## 2017-07-26 ENCOUNTER — Ambulatory Visit: Payer: Self-pay | Admitting: Pharmacist

## 2017-07-26 ENCOUNTER — Other Ambulatory Visit: Payer: Self-pay | Admitting: Pharmacist

## 2017-07-26 NOTE — Patient Outreach (Signed)
[facility]) Care Management  07/26/2017  TIRZA SENTENO [birth year] [id number]   Patient was called regarding medication assistance. Unfortunately, she did not answer the phone and her voicemail is not set up. (2nd unsuccessful attempt)  Plan:  Call patient back in 1-2 business days.  [name], PharmD, [place] Clinical Pharmacist [phone]

## 2017-07-26 NOTE — Patient Outreach (Signed)
[facility]) Care Management  07/26/2017  [name] 01-27-1956 [id number]   CSW attempted to reach patient to follow-up on referral from [place] on assistance with paying bills, utilities & food. Patient did not answer [phone] and voicemail had not been setup. CSW will try again within a week.    Raynaldo Opitz, [name] [facility]  Clinical Social Worker cell #: [phone]

## 2017-07-28 ENCOUNTER — Other Ambulatory Visit: Payer: Self-pay | Admitting: Pharmacist

## 2017-07-28 ENCOUNTER — Encounter: Payer: Self-pay | Admitting: Pharmacist

## 2017-07-28 NOTE — Patient Outreach (Signed)
[facility]) Care Management  07/28/2017  [name] Oct 10, 1955 [id number]   Patient was called regarding medication assistance. Unfortunately, she did not answer the phone and her voicemail is not set up. (3rd unsuccessful attempt)  Plan:  Send patient an unsuccessful contact letter Close patient's case if she does not contact me within 10 business days.  [name], PharmD, [place] Clinical Pharmacist [phone]

## 2017-07-28 NOTE — Progress Notes (Signed)
Please find out what patient wants to do

## 2017-07-28 NOTE — Patient Outreach (Signed)
[facility]) Care Management  [place]   07/28/2017  [name] 12-Oct-1955 [id number]  Subjective: Patient called me back from my call to her earlier today regarding medication assistance. HIPAA identifiers were obtained.  Patient is a 62 year old female with multiple medical conditions including but not limited to:  CAD, anxiety, depression, hyperlipidemia, restless leg syndrome, hypertension, type 2 diabetes and vitamin D deficiency.  Patient reported difficulty affording her inhalers (Ventolin and Breo).  She has never actually had Breo filled as her provider has been giving her samples.  Objective:   Encounter Medications: Outpatient Encounter Medications as of 07/28/2017  Medication Sig  . albuterol (PROVENTIL HFA;VENTOLIN HFA) 108 (90 Base) MCG/ACT inhaler Inhale 2 puffs into the lungs every 6 (six) hours as needed for wheezing or shortness of breath.  . ALPRAZolam (XANAX) 1 MG tablet TAKE 1 TABLET BY MOUTH TWICE DAILY AND MAY  TAKE  AN  EXTRA  1/2  TABLET  AS  NEEDED  . amoxicillin-clavulanate (AUGMENTIN) 875-125 MG tablet Take 1 tablet by mouth 2 (two) times daily.  [name] aspirin 325 MG tablet Take 325 mg by mouth daily.    [name] atorvastatin (LIPITOR) 80 MG tablet Take 1 tablet (80 mg total) by mouth daily.  . blood glucose meter kit and supplies KIT Dispense based on pt and ins preference. Use up to QDas directed. (FOR ICD-10 E11.9).  [name] Cholecalciferol (VITAMIN D3) 5000 units TABS Take 1 tablet by mouth daily.  [name] escitalopram (LEXAPRO) 20 MG tablet Take 1 tablet (20 mg total) by mouth daily.  . fish oil-omega-3 fatty acids 1000 MG capsule Take 2 g by mouth 2 (two) times daily.   . fluticasone furoate-vilanterol (BREO ELLIPTA) 100-25 MCG/INH AEPB Inhale 1 puff into the lungs daily.  . isosorbide mononitrate (IMDUR) 30 MG 24 hr tablet Take 0.5 tablets (15 mg total) by mouth daily.  [name] lisinopril (PRINIVIL,ZESTRIL) 20 MG tablet Take 1 tablet (20 mg total) by  mouth daily.  . metFORMIN (GLUCOPHAGE) 500 MG tablet Take 1 tablet (500 mg total) by mouth 2 (two) times daily with a meal.  . metoprolol tartrate (LOPRESSOR) 25 MG tablet Take 1 tablet (25 mg total) by mouth 2 (two) times daily.  . pramipexole (MIRAPEX) 0.5 MG tablet Take 1 tablet (0.5 mg total) by mouth 2 (two) times daily.  . vitamin B-12 (CYANOCOBALAMIN) 1000 MCG tablet Take 1,000 mcg by mouth daily.   No facility-administered encounter medications on file as of 07/28/2017.     Functional Status: In your present state of health, do you have any difficulty performing the following activities: 07/22/2017 01/29/2017  Hearing? N N  Vision? N N  Difficulty concentrating or making decisions? N Y  Walking or climbing stairs? N N  Dressing or bathing? N N  Doing errands, shopping? N N  Preparing Food and eating ? N N  Using the Toilet? N N  In the past six months, have you accidently leaked urine? Y N  Do you have problems with loss of bowel control? N N  Managing your Medications? N N  Managing your Finances? Y N  Comment "not enough money" but able to oversee finances -  Housekeeping or managing your Housekeeping? N N  Some recent data might be hidden    Fall/Depression Screening: Fall Risk  07/22/2017 07/16/2017 07/08/2017  Falls in the past year? No Yes No  Number falls in past yr: - 1 -  Injury with Fall? No No -  Risk for fall due to : Medication side effect History of fall(s);Impaired vision -  Follow up Falls evaluation completed Falls evaluation completed -   PHQ 2/9 Scores 07/22/2017 07/16/2017 07/08/2017 05/11/2017 03/10/2017 01/29/2017 11/13/2016  PHQ - 2 Score _0 0 4 5 0  PHQ- 9 Score _1 - 16 21 -      Assessment: Patient's medications were reviewed via telephone.   Drugs sorted by system:  Neurologic/Psychologic: Escitalopram Alprazolam Pramipexole  Cardiovascular: Aspirin Atorvastatin Isosorbide MN Metoprolol Lisinopril Omega 3 Fatty  Acid  Pulmonary/Allergy: Ventolin HFA Breo Ellipta  Gastrointestinal:  Endocrine: Metformin  Vitamins/Minerals: Cholecalciferol Cyanocobalamin  Infectious Diseases: Amoxi-Clav  Medication Review Findings:   Adherence Breo-patient reported not using this while she had a cold because she was using Ventolin and was afraid of having too much medication.  Patient was educated on how Memory Dance works and advised to use it daily.  Metformin-patient reported she only takes metformin daily due to forgetting to take it.  Since she is only on 1000 mg daily, taking 2 tablets daily may help patient.  Medication Assistance Findings: -patient has [employer] and has a $160 deductible -if patient uses [facility] pharmacy, the generic medications on her profile can be obtained at no cost for 90 day supplies of each medication: (Metformin, metoprolol, escitalopram, lisinopril, isosorbide mononitrate, pramipexole)  -patient also has an OTC benefit on her plan that she has not been using.  She is eligible to use $50 per quarter to purchase select OTC products (Aspirin, Chloecalciferol, Cyanocobalamin)  --These medications were ordered from [facility] on the patient's behalf.  -patient reported paying $54 for Ventolin HFA  -patient may qualify for the "Extra Help" program offered by Social Security  -Extra Help application was completed online on the patient's behalf  -patient appears to qualify for [place] patient assistance program from a financial standpoint for Breo and Ventolin  but she has NOT spent the required $600 in out-of-pocket medication expenses  -IF deemed therapeutically appropriate, she could be switched to Proventil HFA and Dulera which can both be obtained from [facility] patient assistance program.   Plan: Route note to patient's provider to request patient's prescriptions be sent to [place]  Follow up with patient in 10-14 days to see if she  received her OTC products or heard anything from the Extra Help Program. (Reference number: [id number])

## 2017-07-29 ENCOUNTER — Ambulatory Visit: Payer: Self-pay | Admitting: Pharmacist

## 2017-07-30 ENCOUNTER — Other Ambulatory Visit: Payer: Self-pay | Admitting: *Deleted

## 2017-07-30 ENCOUNTER — Telehealth: Payer: Self-pay | Admitting: Nurse Practitioner

## 2017-07-30 DIAGNOSIS — E785 Hyperlipidemia, unspecified: Secondary | ICD-10-CM

## 2017-07-30 MED ORDER — METOPROLOL TARTRATE 25 MG PO TABS: 25.0000 mg | ORAL_TABLET | Freq: Two times a day (BID) | ORAL | 1 refills | Status: DC

## 2017-07-30 NOTE — Telephone Encounter (Signed)
Aware. One month sent to [facility].  Patient will figure out if [facility] mail in can be pursued.

## 2017-08-03 ENCOUNTER — Other Ambulatory Visit: Payer: Self-pay | Admitting: *Deleted

## 2017-08-03 NOTE — Patient Outreach (Signed)
[facility]) Care Management  08/03/2017  CINDY FULLMAN 03-14-1956 [id number]   [place] made second attempt to try to reach patient, phone #: [phone] and CSW was unable to leave a voicemail as her voicemail box has not been setup yet. CSW will have unsuccessful attempt letter mailed to patient & followup in 1 week.    Raynaldo Opitz, [name] [facility]  Clinical Social Worker cell #: [phone]

## 2017-08-03 NOTE — Progress Notes (Signed)
Will you send meds to [name] per note?

## 2017-08-05 ENCOUNTER — Other Ambulatory Visit: Payer: Self-pay | Admitting: *Deleted

## 2017-08-05 DIAGNOSIS — F411 Generalized anxiety disorder: Secondary | ICD-10-CM

## 2017-08-05 DIAGNOSIS — G2581 Restless legs syndrome: Secondary | ICD-10-CM

## 2017-08-05 DIAGNOSIS — E785 Hyperlipidemia, unspecified: Secondary | ICD-10-CM

## 2017-08-05 DIAGNOSIS — I10 Essential (primary) hypertension: Secondary | ICD-10-CM

## 2017-08-05 DIAGNOSIS — E1159 Type 2 diabetes mellitus with other circulatory complications: Secondary | ICD-10-CM

## 2017-08-05 MED ORDER — LISINOPRIL 20 MG PO TABS: 20.0000 mg | ORAL_TABLET | Freq: Every day | ORAL | 0 refills | Status: DC

## 2017-08-05 MED ORDER — PRAMIPEXOLE DIHYDROCHLORIDE 0.5 MG PO TABS: 0.5000 mg | ORAL_TABLET | Freq: Two times a day (BID) | ORAL | 0 refills | Status: DC

## 2017-08-05 MED ORDER — METFORMIN HCL 500 MG PO TABS: 500.0000 mg | ORAL_TABLET | Freq: Two times a day (BID) | ORAL | 0 refills | Status: DC

## 2017-08-05 MED ORDER — ATORVASTATIN CALCIUM 80 MG PO TABS: 80.0000 mg | ORAL_TABLET | Freq: Every day | ORAL | 0 refills | Status: DC

## 2017-08-05 MED ORDER — ESCITALOPRAM OXALATE 20 MG PO TABS: 20.0000 mg | ORAL_TABLET | Freq: Every day | ORAL | 0 refills | Status: DC

## 2017-08-05 MED ORDER — METOPROLOL TARTRATE 25 MG PO TABS
25.0000 mg | ORAL_TABLET | Freq: Two times a day (BID) | ORAL | 0 refills | Status: DC
Start: 2017-08-05 — End: 2018-01-11

## 2017-08-05 NOTE — Addendum Note (Signed)
Addended by: Antonietta Barcelona D on: 08/05/2017 10:51 AM   Modules accepted: Orders

## 2017-08-09 ENCOUNTER — Other Ambulatory Visit: Payer: Self-pay | Admitting: *Deleted

## 2017-08-10 ENCOUNTER — Encounter: Payer: Self-pay | Admitting: *Deleted

## 2017-08-10 NOTE — Patient Outreach (Signed)
[facility] ([facility]) Care Management   08/10/2017  [name] 09/07/1958 [id number]  [name] is an 62 y.o. female   ROS  Physical Exam  Encounter Medications:   Outpatient Encounter Medications as of 08/09/2017  Medication Sig  . albuterol (PROVENTIL HFA;VENTOLIN HFA) 108 (90 Base) MCG/ACT inhaler Inhale 2 puffs into the lungs every 6 (six) hours as needed for wheezing or shortness of breath.  . ALPRAZolam (XANAX) 1 MG tablet TAKE 1 TABLET BY MOUTH TWICE DAILY AND MAY  TAKE  AN  EXTRA  1/2  TABLET  AS  NEEDED  . aspirin 325 MG tablet Take 325 mg by mouth daily.    . atorvastatin (LIPITOR) 80 MG tablet Take 1 tablet (80 mg total) by mouth daily.  . blood glucose meter kit and supplies KIT Dispense based on pt and ins preference. Use up to QDas directed. (FOR ICD-10 E11.9).  . Cholecalciferol (VITAMIN D3) 5000 units TABS Take 1 tablet by mouth daily.  . escitalopram (LEXAPRO) 20 MG tablet Take 1 tablet (20 mg total) by mouth daily.  . fish oil-omega-3 fatty acids 1000 MG capsule Take 2 g by mouth 2 (two) times daily.   . fluticasone furoate-vilanterol (BREO ELLIPTA) 100-25 MCG/INH AEPB Inhale 1 puff into the lungs daily.  . isosorbide mononitrate (IMDUR) 30 MG 24 hr tablet Take 0.5 tablets (15 mg total) by mouth daily.  . lisinopril (PRINIVIL,ZESTRIL) 20 MG tablet Take 1 tablet (20 mg total) by mouth daily.  . metFORMIN (GLUCOPHAGE) 500 MG tablet Take 1 tablet (500 mg total) by mouth 2 (two) times daily with a meal.  . metoprolol tartrate (LOPRESSOR) 25 MG tablet Take 1 tablet (25 mg total) by mouth 2 (two) times daily.  . pramipexole (MIRAPEX) 0.5 MG tablet Take 1 tablet (0.5 mg total) by mouth 2 (two) times daily.  . vitamin B-12 (CYANOCOBALAMIN) 1000 MCG tablet Take 1,000 mcg by mouth daily.   No facility-administered encounter medications on file as of 08/09/2017.     Functional Status:   In your present state of health, do you have any difficulty performing  the following activities: 08/10/2017 07/22/2017  Hearing? N N  Vision? N N  Difficulty concentrating or making decisions? N N  Walking or climbing stairs? N N  Dressing or bathing? N N  Doing errands, shopping? N N  Preparing Food and eating ? - N  Using the Toilet? - N  In the past six months, have you accidently leaked urine? - Y  Do you have problems with loss of bowel control? - N  Managing your Medications? - N  Managing your Finances? - Y  Comment - "not enough money" but able to oversee finances  Housekeeping or managing your Housekeeping? - N  Some recent data might be hidden    Fall/Depression Screening:    Fall Risk  08/10/2017 07/22/2017 07/16/2017  Falls in the past year? No No Yes  Number falls in past yr: - - 1  Injury with Fall? - No No  Risk for fall due to : - Medication side effect History of fall(s);Impaired vision  Follow up - Falls evaluation completed Falls evaluation completed   PHQ 2/9 Scores 08/10/2017 07/22/2017 07/16/2017 07/08/2017 05/11/2017 03/10/2017 01/29/2017  PHQ - 2 Score 2 2 2 2 0 4 5  PHQ- 9 Score 11 12 13 13 - 16 21    Assessment:    CSW had received referral from [facility] Telephonic RNCM, [place] for financial assistance with   utilities, bills & food. CSW had successfully reached patient via phone after 3rd attempt to discuss concerns. Patient informed CSW that she receives $15 a month in food stamps and is receiving a monthly social security check. CSW spoke with patient about applying for Medicaid and will have CMA mail application along with resources for patient. Patient reports that she is currently struggling with anxiety following bronchitis flare up. CSW provided supportive listening and encouraged patient to speak with her PCP about medications as she states that she takes xanax but feels drowsy afterwards so therefore, does not like to take it often. Patient shared that she has also been struggling with depression following her sister's death  10/13/15 but that her daughter Morgan and 2 grandchildren live in [place] and are supportive and involved with patient.   Plan:   [facility] CM Care Plan Problem One     Most Recent Value  Care Plan Problem One  Knowledge deficit related to financial assistance  Role Documenting the Problem One  Clinical Social Worker  Care Plan for Problem One  Active  [facility] Long Term Goal   Within the next 60 days, patient will be linked up with community resources as requested  [facility] Long Term Goal Start Date  08/10/17  Interventions for Problem One Long Term Goal  CSW will have CMA mail information on food banks, financial & utility assistance      CSW will have CMA mail information to patient & CSW will follow-up via phone in 2 weeks to ensure that she has received information and schedule home visit to have advance directives scanned into Epic.     , [name] [facility]  Clinical Social Worker cell #: [phone]     

## 2017-08-11 ENCOUNTER — Other Ambulatory Visit: Payer: Self-pay | Admitting: *Deleted

## 2017-08-11 ENCOUNTER — Telehealth: Payer: Self-pay | Admitting: Pharmacist

## 2017-08-11 ENCOUNTER — Encounter: Payer: Self-pay | Admitting: *Deleted

## 2017-08-11 NOTE — Telephone Encounter (Signed)
-----   Message from Kassie Mends, RN sent at 08/11/2017 11:52 AM EST ----- Regarding: update  I saw pt today for home visit and she doesn't have metoprolol and says she's waiting on [employer] to send and in the meantime there is a partial dose to pickup at [facility] and pt states she does not have the money to get it,  She says she doesn't know what the cost is.     thanks

## 2017-08-11 NOTE — Patient Outreach (Signed)
[facility]) Care Management  08/11/2017  [name] August 01, 1955 [id number]   Received a message from [place], [name] stating the patient did not have any more metoprolol and could not afford to pick it up.    Patient was called. HIPAA identifiers were obtained. Patient confirmed she has been out of Metoprolol for about 1 week.  Patient also confirmed metoprolol is en route to be delivered from [place]. [employer] reported the medication was shipped 08/10/17.  Patient also said she does not have brakes on her car or a friend she can ask to go and pick up her medication for her.    Called [facility] to get the price of the small supply that was called in. [facility] quoted the cash price for 60 tablets as $4.00  [facility] will not accept credit card payments via telephone (if we were to use [facility]).  Plan: Check back in with the patient by the end of the week. IF she has not gotten her metoprolol delivered from [facility], the prescription will be transferred to an independent pharmacy that will accept over-the-phone credit card payments.  [name], PharmD, [place] Clinical Pharmacist [phone]

## 2017-08-11 NOTE — Patient Outreach (Signed)
Request received from Kelly Harrison, [name] to mail patient personal care resources.  Information mailed today. 
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
## 2017-08-13 ENCOUNTER — Other Ambulatory Visit: Payer: Self-pay | Admitting: Pharmacist

## 2017-08-13 NOTE — Patient Outreach (Signed)
[place] [facility]) Care Management  08/13/2017  [name] [birth year] [id number]   Patient called to let me know metoprolol arrived in the mail for her from [place].  The patient does not have a copay for tier 1 & 2 medications if they are filled at the [facility].  Plan:   Check back in with the patient in 2-3 weeks to see if she heard back from the Extra Help Application.  [name], PharmD, [place] Clinical Pharmacist [phone]

## 2017-08-16 ENCOUNTER — Ambulatory Visit: Payer: Self-pay | Admitting: Pharmacist

## 2017-08-24 ENCOUNTER — Other Ambulatory Visit: Payer: Self-pay | Admitting: *Deleted

## 2017-08-24 NOTE — Patient Outreach (Signed)
[place] [facility]) Care Management  08/24/2017  [name] Jul 17, 1956 [id number]   [place] called & spoke with patient to follow-up on community resources. Patient informed CSW that someone from [employer] had contacted [facility] and they came to her house to replace the hot water heater and make repairs in the bathroom. Patient states that she has still not received the resources in the mail but will keep an eye out and notify CSW once received. CSW encouraged patient to complete Medicaid application which was included in the resources and give CSW a call once received.   CSW will follow-up with patient in 1 month if no call.    Raynaldo Opitz, [name] [facility]  Clinical Social Worker cell #: [phone]

## 2017-09-09 ENCOUNTER — Encounter: Payer: Self-pay | Admitting: *Deleted

## 2017-09-09 ENCOUNTER — Telehealth: Payer: Self-pay | Admitting: Pharmacist

## 2017-09-09 ENCOUNTER — Other Ambulatory Visit: Payer: Self-pay | Admitting: *Deleted

## 2017-09-09 NOTE — Telephone Encounter (Signed)
-----   Message from Kassie Mends, RN sent at 09/09/2017 12:13 PM EST ----- Regarding: update I saw pt for home visit today and I'm closing out her case. She is doing much better but does continue to smoke.  She states she ran out of isosorbide yesterday and cannot afford to get at [facility] and states she's not sure why [employer] didn't mail with her other meds.  Can you call her about this?   I think once she gets the isosorbide she will be good to go and she loves the [facility] mail order.  thanks

## 2017-09-09 NOTE — Patient Outreach (Addendum)
[place] [facility]) Care Management  09/09/2017  [name] 11/03/1955 [id number]   Received a message from [place], Tracy Larsen stating the patient is out of isosorbide. Patient was called HIPAA identifiers were obtained. The patient was under the impression isosorbide mononitrate would be coming from [facility]. The prescription was sent to [facility] . [facility] was called. The Pharmacist said the patient had a prescription available for refill and the copay would be $3.40 for a 90 day supply.  The patient confirmed she could afford to pay the $3.40.  The price of the prescription and the patient confirming she received a letter from [employer] stating she is approved for LIS (extra help program)--prove the patent now has LIS.  Plan: Close patient's case as she was referred for medication assistance and she was approved for LIS after the LIS application was completed on her behalf.  [place] Nurse has already closed the patient.  A note will be sent to Dr. Evette Reid requesting she send all the patient's chronic medications to [place] so the patient will have $0 copays.  [name], PharmD, [place] Clinical Pharmacist [phone]

## 2017-09-09 NOTE — Patient Outreach (Signed)
[place] [facility]) Care Management   09/09/2017  MAURICE RAMSEUR 1956/03/06 [id number]  [name] is an 62 y.o. female  Subjective: Routine home visit with pt, HIPAA verified, pt reports she is doing well, no changes reported, pt states she ran out of isosorbide yesterday and that she can pickup from [place] but has no money, also not sure why [employer] did not deliver isosorbide with her other medications, pt prefers the mail order system.  Objective:   Vitals:   09/09/17 1148  BP: 120/64  Pulse: 75  Resp: 18  SpO2: 97%   ROS  Physical Exam  Encounter Medications:   Outpatient Encounter Medications as of 09/09/2017  Medication Sig  . albuterol (PROVENTIL HFA;VENTOLIN HFA) 108 (90 Base) MCG/ACT inhaler Inhale 2 puffs into the lungs every 6 (six) hours as needed for wheezing or shortness of breath.  . ALPRAZolam (XANAX) 1 MG tablet TAKE 1 TABLET BY MOUTH TWICE DAILY AND MAY  TAKE  AN  EXTRA  1/2  TABLET  AS  NEEDED  . aspirin 325 MG tablet Take 325 mg by mouth daily.    [name] atorvastatin (LIPITOR) 80 MG tablet Take 1 tablet (80 mg total) by mouth daily.  . blood glucose meter kit and supplies KIT Dispense based on pt and ins preference. Use up to QDas directed. (FOR ICD-10 E11.9).  [name] Cholecalciferol (VITAMIN D3) 5000 units TABS Take 1 tablet by mouth daily.  [name] escitalopram (LEXAPRO) 20 MG tablet Take 1 tablet (20 mg total) by mouth daily.  . fluticasone furoate-vilanterol (BREO ELLIPTA) 100-25 MCG/INH AEPB Inhale 1 puff into the lungs daily.  [name] lisinopril (PRINIVIL,ZESTRIL) 20 MG tablet Take 1 tablet (20 mg total) by mouth daily.  . metFORMIN (GLUCOPHAGE) 500 MG tablet Take 1 tablet (500 mg total) by mouth 2 (two) times daily with a meal.  . metoprolol tartrate (LOPRESSOR) 25 MG tablet Take 1 tablet (25 mg total) by mouth 2 (two) times daily.  . pramipexole (MIRAPEX) 0.5 MG tablet Take 1 tablet (0.5 mg total) by mouth 2 (two) times daily.  . vitamin B-12 (CYANOCOBALAMIN)  1000 MCG tablet Take 1,000 mcg by mouth daily.  . fish oil-omega-3 fatty acids 1000 MG capsule Take 2 g by mouth 2 (two) times daily.   . isosorbide mononitrate (IMDUR) 30 MG 24 hr tablet Take 0.5 tablets (15 mg total) by mouth daily. (Patient not taking: Reported on 09/09/2017)   No facility-administered encounter medications on file as of 09/09/2017.     Functional Status:   In your present state of health, do you have any difficulty performing the following activities: 08/10/2017 07/22/2017  Hearing? N N  Vision? N N  Difficulty concentrating or making decisions? N N  Walking or climbing stairs? N N  Dressing or bathing? N N  Doing errands, shopping? N N  Preparing Food and eating ? - N  Using the Toilet? - N  In the past six months, have you accidently leaked urine? - Y  Do you have problems with loss of bowel control? - N  Managing your Medications? - N  Managing your Finances? - Y  Comment - "not enough money" but able to oversee finances  Housekeeping or managing your Housekeeping? - N  Some recent data might be hidden    Fall/Depression Screening:    Fall Risk  08/11/2017 08/10/2017 07/22/2017  Falls in the past year? No No No  Number falls in past yr: - - -  Injury with Fall?  No - No  Risk for fall due to : Medication side effect - Medication side effect  Follow up Falls evaluation completed - Falls evaluation completed   PHQ 2/9 Scores 08/10/2017 07/22/2017 07/16/2017 07/08/2017 05/11/2017 03/10/2017 01/29/2017  PHQ - 2 Score 2 2 2 2  0 4 5  PHQ- 9 Score 11 12 13 13  - 16 21    Assessment:  Pt continues smoking 1 pack per day and not ready to quit at present, RN CM sent in basket to [place] reporting pt does not have isosorbide and cannot afford.  RN CM placed emphasis today on COPD action plan and smoking cessation.  RN CM discussed discharge plan with pt and pt agreeable with discharge and declines health coach services. RN CM faxed case closure letter to  primary MD and mailed case closure letter to patient's home.  THN CM Care Plan Problem One     Most Recent Value  Care Plan Problem One  Knowledge deficit related to COPD  Role Documenting the Problem One  Care Management Coordinator  Care Plan for Problem One  Active  THN Long Term Goal   Pt will demonstrate/ verbalize improved health behaviors related to COPD within 60 days.  THN Long Term Goal Start Date  07/22/17  THN Long Term Goal Met Date  09/09/17  Interventions for Problem One Long Term Goal  RN CM reviewed medications with pt, pt does not have isosorbide and states it is at [facility] for pickup but pt would like to have delivered from [facility] and not sure why they did not deliver with her other medications, RN CM sent in basket to Denyse Amass reporting pt does not have isosorbide  THN CM Short Term Goal #1   pt will verbalize COPD action plan/ zones within 30 days  THN CM Short Term Goal #1 Met Date  09/09/17  Interventions for Short Term Goal #1  RN CM reviewed COPD action plan/ zones  THN CM Short Term Goal #2   Pt will verbalize environmental factors that exacerbate COPD within 30 days  THN CM Short Term Goal #2 Start Date  07/22/17  [facility] CM Short Term Goal #2 Met Date  08/11/17      Plan: discharge today [facility] pharmacist continues working with pt  [name] [facility], [place] Coordinator [phone]

## 2017-09-10 ENCOUNTER — Ambulatory Visit: Payer: Self-pay | Admitting: Pharmacist

## 2017-09-10 NOTE — Patient Outreach (Signed)
[facility] has closed case to Pharmacy.

## 2017-09-21 ENCOUNTER — Other Ambulatory Visit: Payer: Self-pay | Admitting: Nurse Practitioner

## 2017-09-21 DIAGNOSIS — F411 Generalized anxiety disorder: Secondary | ICD-10-CM

## 2017-09-21 NOTE — Telephone Encounter (Signed)
Last seen 07/12/17  MMM

## 2017-09-23 ENCOUNTER — Other Ambulatory Visit: Payer: Self-pay | Admitting: *Deleted

## 2017-09-27 NOTE — Patient Outreach (Signed)
[facility] [place] [facility]) Care Management  09/27/2017  LIVIER HENDEL [birth year] [id number]   CSW called & spoke with patient to follow-up on resources mailed to patient regarding Medicaid. Patient states that she's been really bad about checking her mail and going through it but is sure that it's in the stack. CSW encouraged patient to review the medicaid application, complete it and turn it in to DSS. Patient is familiar with DSS as she receives $15 in foodstamps every month. Patient plans to complete application in the next week or two.   CSW will perform a case closure on patient, as all goals of treatment have been met from social work standpoint and no additional social work needs have been identified at this time.   CSW will fax an update to patient's Primary Care Physician, [name], NP to ensure that they are aware of CSW's involvement with patient's plan of care. CSW will submit a case closure request to Verlon Setting, Care Management Assistant with [place] in the form of an inbasket message.    Raynaldo Opitz, [name] [facility]  Clinical Social Worker cell #: [phone]

## 2017-10-21 ENCOUNTER — Other Ambulatory Visit: Payer: Self-pay | Admitting: Nurse Practitioner

## 2017-10-21 DIAGNOSIS — F411 Generalized anxiety disorder: Secondary | ICD-10-CM

## 2017-10-22 NOTE — Telephone Encounter (Signed)
Last seen 07/12/17  MMM

## 2017-11-18 ENCOUNTER — Ambulatory Visit: Payer: Medicare HMO | Admitting: Family Medicine

## 2017-11-21 ENCOUNTER — Other Ambulatory Visit: Payer: Self-pay | Admitting: Nurse Practitioner

## 2017-11-21 DIAGNOSIS — F411 Generalized anxiety disorder: Secondary | ICD-10-CM

## 2017-11-22 ENCOUNTER — Other Ambulatory Visit: Payer: Self-pay | Admitting: *Deleted

## 2017-11-22 ENCOUNTER — Ambulatory Visit (INDEPENDENT_AMBULATORY_CARE_PROVIDER_SITE_OTHER): Payer: Medicare HMO | Admitting: Family Medicine

## 2017-11-22 ENCOUNTER — Encounter: Payer: Self-pay | Admitting: Family Medicine

## 2017-11-22 VITALS — BP 139/79 | HR 68 | Temp 98.1°F | Ht 63.0 in | Wt 199.0 lb

## 2017-11-22 DIAGNOSIS — I2510 Atherosclerotic heart disease of native coronary artery without angina pectoris: Secondary | ICD-10-CM | POA: Diagnosis not present

## 2017-11-22 DIAGNOSIS — I2583 Coronary atherosclerosis due to lipid rich plaque: Secondary | ICD-10-CM

## 2017-11-22 DIAGNOSIS — Z0289 Encounter for other administrative examinations: Secondary | ICD-10-CM

## 2017-11-22 MED ORDER — BLOOD GLUCOSE MONITOR KIT: PACK | 0 refills | Status: DC

## 2017-11-22 NOTE — Progress Notes (Signed)
Subjective: CC: DMV paperwork PCP: Tracy Pretty, FNP [name] is a 62 y.o. female presenting to clinic today for:  1. DMV paperwork Patient presents to office for completion of DMV paperwork for need for corrective lenses and known coronary artery disease.  She notes she was unable to get in with her primary care doctor in order to get these completed.  She notes compliance with corrective lenses.  We did discuss that the [name] paperwork notes that that particular section must be completed by a certified optometrist or ophthalmologist.  With regards to her cardiovascular history, she had an NSTEMI in April 2018 during which time she underwent cardiac catheterization.  PCI was unsuccessful.  She had a left heart cath in 2012, during which time her M RCA stent was occluded.   She has not seen her cardiologist since 04/2012.  She notes she was unaware that she was to continue seeing them.  She notes some dyspnea with moderate exertion.  No dyspnea at rest.  Denies chest pain, heart palpitations, syncopal episodes.  She reports compliance with her medications.  She also brings in a tax form that needs to be completed by her primary care doctor.  She reports that she is on total disability.   ROS: Per HPI  Allergies  Allergen Reactions  . Food Other (See Comments)    Nutmeg= difficulty breathing   Past Medical History:  Diagnosis Date  . Anxiety   . Breast cancer ([place]) 06/30/11   inv ductal, ER/PR +, her-2 -  . CAD (coronary artery disease)    NSTEMI 4/08: OM3 occluded (PCI unsuccessful), dRCA 95% => BMS, inf HK, EF 50%;   b. MV 11/12:  [place] ischemia => c.  [place] 11/12:  dLAD 70%, OM1 70-80%, then occluded (no change from 2008), dOM filled L->L collats, mRCA stent occluded, dRCA filled L->R collats, EF 55-65% => med Rx (consider PCI of RCA if refractory angina)  . Carotid stenosis    dopplers 5/08: 0-30% bilateral; 10/12: 0-39% B/L ICA => f/u 04/2013  . Chronic kidney disease    . COPD (chronic obstructive pulmonary disease) ([place])   . Depression   . Depression with anxiety   . GERD (gastroesophageal reflux disease)    OTC acid reducer prn  . Headache(784.0)    sinus; occ. migraines  . Hx of radiation therapy 08/24/11 to 10/07/11   L breast  . Hyperlipidemia   . Hypertension    under control; has been on med. x 4 yrs.  . Myocardial infarction ([place])   . Stable angina ([place])    a. med Rx after cath 05/2011  . Stress incontinence, female     Current Outpatient Medications:  .  albuterol (PROVENTIL HFA;VENTOLIN HFA) 108 (90 Base) MCG/ACT inhaler, Inhale 2 puffs into the lungs every 6 (six) hours as needed for wheezing or shortness of breath., Disp: 1 Inhaler, Rfl: 0 .  ALPRAZolam (XANAX) 1 MG tablet, TAKE 1 TABLET BY MOUTH TWICE DAILY AND 1/2 (ONE-HALF)TABLET AS NEEDED, Disp: 75 tablet, Rfl: 0 .  aspirin 325 MG tablet, Take 325 mg by mouth daily.  , Disp: , Rfl:  .  atorvastatin (LIPITOR) 80 MG tablet, Take 1 tablet (80 mg total) by mouth daily., Disp: 90 tablet, Rfl: 0 .  blood glucose meter kit and supplies KIT, Dispense based on pt and ins preference. Use up to QDas directed. (FOR ICD-10 E11.9)., Disp: 1 each, Rfl: 0 .  Cholecalciferol (VITAMIN D3) 5000 units TABS, Take 1  tablet by mouth daily., Disp: , Rfl:  .  escitalopram (LEXAPRO) 20 MG tablet, Take 1 tablet (20 mg total) by mouth daily., Disp: 90 tablet, Rfl: 0 .  fish oil-omega-3 fatty acids 1000 MG capsule, Take 2 g by mouth 2 (two) times daily. , Disp: , Rfl:  .  fluticasone furoate-vilanterol (BREO ELLIPTA) 100-25 MCG/INH AEPB, Inhale 1 puff into the lungs daily., Disp: 60 each, Rfl: 3 .  isosorbide mononitrate (IMDUR) 30 MG 24 hr tablet, Take 0.5 tablets (15 mg total) by mouth daily., Disp: 90 tablet, Rfl: 2 .  lisinopril (PRINIVIL,ZESTRIL) 20 MG tablet, Take 1 tablet (20 mg total) by mouth daily., Disp: 90 tablet, Rfl: 0 .  metFORMIN (GLUCOPHAGE) 500 MG tablet, Take 1 tablet (500 mg total) by mouth 2  (two) times daily with a meal., Disp: 180 tablet, Rfl: 0 .  metoprolol tartrate (LOPRESSOR) 25 MG tablet, Take 1 tablet (25 mg total) by mouth 2 (two) times daily., Disp: 180 tablet, Rfl: 0 .  pramipexole (MIRAPEX) 0.5 MG tablet, Take 1 tablet (0.5 mg total) by mouth 2 (two) times daily., Disp: 180 tablet, Rfl: 0 .  vitamin B-12 (CYANOCOBALAMIN) 1000 MCG tablet, Take 1,000 mcg by mouth daily., Disp: , Rfl:  Social History   Socioeconomic History  . Marital status: Single    Spouse name: Not on file  . Number of children: Not on file  . Years of education: Not on file  . Highest education level: Not on file  Occupational History  . Not on file  Social Needs  . Financial resource strain: Not on file  . Food insecurity:    Worry: Not on file    Inability: Not on file  . Transportation needs:    Medical: Not on file    Non-medical: Not on file  Tobacco Use  . Smoking status: Current Every Day Smoker    Packs/day: 1.00    Years: 35.00    Pack years: 35.00    Types: Cigarettes  . Smokeless tobacco: Never Used  Substance and Sexual Activity  . Alcohol use: Yes    Alcohol/week: 0.6 oz    Types: 1 Glasses of wine per week    Comment: occasionally  . Drug use: No  . Sexual activity: Never    Birth control/protection: Post-menopausal  Lifestyle  . Physical activity:    Days per week: Not on file    Minutes per session: Not on file  . Stress: Not on file  Relationships  . Social connections:    Talks on phone: Not on file    Gets together: Not on file    Attends religious service: Not on file    Active member of club or organization: Not on file    Attends meetings of clubs or organizations: Not on file    Relationship status: Not on file  . Intimate partner violence:    Fear of current or ex partner: Not on file    Emotionally abused: Not on file    Physically abused: Not on file    Forced sexual activity: Not on file  Other Topics Concern  . Not on file  Social History  Narrative  . Not on file   Family History  Problem Relation Age of Onset  . Heart disease Mother   . Heart disease Father   . Cancer Maternal Aunt        pt unaware of what kind  . Cancer Maternal Grandmother  ovarian  . Heart attack Sister   . Bipolar disorder Sister     Objective: Office vital signs reviewed. BP 139/79   Pulse 68   Temp 98.1 F (36.7 C) (Oral)   Ht 5' 3"  (1.6 m)   Wt 199 lb (90.3 kg)   LMP 10/17/2005   SpO2 96%   BMI 35.25 kg/m   Physical Examination:  General: Awake, alert, nontoxic appearing, No acute distress  Assessment/ Plan: 62 y.o. female   1. Encounter for completion of form with patient We discussed that the eye exam portion of the form would need to be completed by her optometrist/ophthalmologist.  The form clearly states that this is supposed to be completed by a certified eye doctor.  I have completed the cardiology portion to the best my ability by reviewing her chart and getting information from previous cardiology visits.  She reports being essentially asymptomatic today and compliant with her cardiac medications.  I did stress to her that she should consider seeing cardiology given the fact that she has had significant cardiovascular disease in the past and currently has a stent.  The October 2013 note did state that she should follow-up with Dr. [name] in 6 months but for some reason she was lost to follow-up.  I have placed the tax a form on her primary care providers desk for completion.  2. Coronary artery disease due to lipid rich plaque See above.   Tracy Norlander, DO [place] [phone]

## 2017-12-03 ENCOUNTER — Other Ambulatory Visit: Payer: Self-pay | Admitting: *Deleted

## 2017-12-03 DIAGNOSIS — E785 Hyperlipidemia, unspecified: Secondary | ICD-10-CM

## 2017-12-03 DIAGNOSIS — F411 Generalized anxiety disorder: Secondary | ICD-10-CM

## 2017-12-03 MED ORDER — ESCITALOPRAM OXALATE 20 MG PO TABS: 20.0000 mg | ORAL_TABLET | Freq: Every day | ORAL | 0 refills | Status: DC

## 2017-12-03 MED ORDER — ATORVASTATIN CALCIUM 80 MG PO TABS: 80.0000 mg | ORAL_TABLET | Freq: Every day | ORAL | 0 refills | Status: DC

## 2017-12-03 NOTE — Telephone Encounter (Signed)
Last refill without being seen 

## 2017-12-10 ENCOUNTER — Other Ambulatory Visit: Payer: Self-pay | Admitting: Nurse Practitioner

## 2017-12-10 DIAGNOSIS — F411 Generalized anxiety disorder: Secondary | ICD-10-CM

## 2017-12-10 DIAGNOSIS — E785 Hyperlipidemia, unspecified: Secondary | ICD-10-CM

## 2017-12-10 DIAGNOSIS — I2510 Atherosclerotic heart disease of native coronary artery without angina pectoris: Secondary | ICD-10-CM

## 2017-12-10 MED ORDER — ATORVASTATIN CALCIUM 80 MG PO TABS
80.0000 mg | ORAL_TABLET | Freq: Every day | ORAL | 0 refills | Status: DC
Start: 2017-12-10 — End: 2018-01-11

## 2017-12-10 MED ORDER — ESCITALOPRAM OXALATE 20 MG PO TABS
20.0000 mg | ORAL_TABLET | Freq: Every day | ORAL | 0 refills | Status: DC
Start: 2017-12-10 — End: 2018-01-11

## 2017-12-10 MED ORDER — ISOSORBIDE MONONITRATE ER 30 MG PO TB24: 15.0000 mg | ORAL_TABLET | Freq: Every day | ORAL | 0 refills | Status: DC

## 2017-12-10 NOTE — Telephone Encounter (Signed)
Pt advised she ntbs with MMM and scheduled appt for 12/23/17 at 2:00 and refill sent into [facility].

## 2017-12-22 ENCOUNTER — Other Ambulatory Visit: Payer: Self-pay | Admitting: Nurse Practitioner

## 2017-12-22 DIAGNOSIS — F411 Generalized anxiety disorder: Secondary | ICD-10-CM

## 2017-12-23 ENCOUNTER — Ambulatory Visit: Payer: Medicare HMO | Admitting: Nurse Practitioner

## 2018-01-04 DIAGNOSIS — E119 Type 2 diabetes mellitus without complications: Secondary | ICD-10-CM | POA: Diagnosis not present

## 2018-01-04 DIAGNOSIS — H5213 Myopia, bilateral: Secondary | ICD-10-CM | POA: Diagnosis not present

## 2018-01-04 LAB — HM DIABETES EYE EXAM

## 2018-01-11 ENCOUNTER — Ambulatory Visit: Payer: Medicare HMO | Admitting: Nurse Practitioner

## 2018-01-11 ENCOUNTER — Encounter: Payer: Self-pay | Admitting: Nurse Practitioner

## 2018-01-11 VITALS — BP 143/80 | HR 62 | Temp 96.9°F | Ht 63.0 in | Wt 202.0 lb

## 2018-01-11 DIAGNOSIS — E559 Vitamin D deficiency, unspecified: Secondary | ICD-10-CM

## 2018-01-11 DIAGNOSIS — E7800 Pure hypercholesterolemia, unspecified: Secondary | ICD-10-CM | POA: Diagnosis not present

## 2018-01-11 DIAGNOSIS — J449 Chronic obstructive pulmonary disease, unspecified: Secondary | ICD-10-CM

## 2018-01-11 DIAGNOSIS — F3342 Major depressive disorder, recurrent, in full remission: Secondary | ICD-10-CM

## 2018-01-11 DIAGNOSIS — F411 Generalized anxiety disorder: Secondary | ICD-10-CM | POA: Diagnosis not present

## 2018-01-11 DIAGNOSIS — E1159 Type 2 diabetes mellitus with other circulatory complications: Secondary | ICD-10-CM | POA: Diagnosis not present

## 2018-01-11 DIAGNOSIS — I6523 Occlusion and stenosis of bilateral carotid arteries: Secondary | ICD-10-CM

## 2018-01-11 DIAGNOSIS — E785 Hyperlipidemia, unspecified: Secondary | ICD-10-CM | POA: Diagnosis not present

## 2018-01-11 DIAGNOSIS — G2581 Restless legs syndrome: Secondary | ICD-10-CM

## 2018-01-11 DIAGNOSIS — K111 Hypertrophy of salivary gland: Secondary | ICD-10-CM

## 2018-01-11 DIAGNOSIS — I2510 Atherosclerotic heart disease of native coronary artery without angina pectoris: Secondary | ICD-10-CM

## 2018-01-11 DIAGNOSIS — I2583 Coronary atherosclerosis due to lipid rich plaque: Secondary | ICD-10-CM

## 2018-01-11 DIAGNOSIS — I10 Essential (primary) hypertension: Secondary | ICD-10-CM

## 2018-01-11 LAB — BAYER DCA HB A1C WAIVED: HB A1C (BAYER DCA - WAIVED): 5.9 % (ref <7.0)

## 2018-01-11 MED ORDER — ATORVASTATIN CALCIUM 80 MG PO TABS: 80.0000 mg | ORAL_TABLET | Freq: Every day | ORAL | 1 refills | Status: DC

## 2018-01-11 MED ORDER — ESCITALOPRAM OXALATE 20 MG PO TABS: 20.0000 mg | ORAL_TABLET | Freq: Every day | ORAL | 1 refills | Status: DC

## 2018-01-11 MED ORDER — PRAMIPEXOLE DIHYDROCHLORIDE 0.5 MG PO TABS: 0.5000 mg | ORAL_TABLET | Freq: Two times a day (BID) | ORAL | 1 refills | Status: DC

## 2018-01-11 MED ORDER — METFORMIN HCL 500 MG PO TABS: 500.0000 mg | ORAL_TABLET | Freq: Two times a day (BID) | ORAL | 1 refills | Status: DC

## 2018-01-11 MED ORDER — FLUTICASONE FUROATE-VILANTEROL 100-25 MCG/INH IN AEPB: 1.0000 | INHALATION_SPRAY | Freq: Every day | RESPIRATORY_TRACT | 3 refills | Status: DC

## 2018-01-11 MED ORDER — ISOSORBIDE MONONITRATE ER 30 MG PO TB24: 15.0000 mg | ORAL_TABLET | Freq: Every day | ORAL | 1 refills | Status: DC

## 2018-01-11 MED ORDER — ALPRAZOLAM 1 MG PO TABS: ORAL_TABLET | ORAL | 2 refills | Status: DC

## 2018-01-11 MED ORDER — METOPROLOL TARTRATE 25 MG PO TABS: 25.0000 mg | ORAL_TABLET | Freq: Two times a day (BID) | ORAL | 1 refills | Status: DC

## 2018-01-11 MED ORDER — LISINOPRIL 20 MG PO TABS: 20.0000 mg | ORAL_TABLET | Freq: Every day | ORAL | 1 refills | Status: DC

## 2018-01-11 NOTE — Progress Notes (Signed)
Subjective:    Patient ID: [name], female    DOB: 1955-12-03, 62 y.o.   MRN: [id number]   Chief Complaint: Medical Management of Chronic Issues   HPI:  1. Type 2 diabetes mellitus with other circulatory complication, without long-term current use of insulin (HCC) last hgba1c was 5.8. She does not check blood sugar at home. No symptoms of hypoglycemia.  2. Pure hypercholesterolemia  She does not watch diet and does no exercise.  3. Essential hypertension  No c/o chest pain, sob or headache. She does not check blood pressure at home. BP Readings from Last 3 Encounters:  01/11/18 (!) 143/80  11/22/17 139/79  09/09/17 120/64     4. Chronic obstructive pulmonary disease, unspecified COPD type H0CC)  Has chronic cough. Refuses to quit smoking.  5. Vitamin D deficiency  Takes vitamin d otc counter daily.  6. Restless leg syndrome  Is on mirapex nightly and hat works well for her so she can sleep  7. Morbid obesity ([place])  No recent weight changes  8. Generalized anxiety disorder  Takes xanax 2 x a day with exxtra 1/2 on occasion.  GAD 7 : Generalized Anxiety Score 05/11/2017 04/08/2016 12/20/2015 11/21/2015  Nervous, Anxious, on Edge 2 2 3 3   Control/stop worrying 2 2 3 3   Worry too much - different things 2 2 2 3   Trouble relaxing 1 3 3 3   Restless 2 3 2 3   Easily annoyed or irritable 1 3 3 3   Afraid - awful might happen 2 3 3 2   Total GAD 7 Score 12 18 19 20   Anxiety Difficulty Very difficult Somewhat difficult Very difficult Very difficult      9. Recurrent major depressive disorder, in full remission ([place])  Is on lexapro and that helps a lot with her depression. But she sya sshe is still having days where sheis tearful Depression screen [facility] 2/9 01/11/2018 11/22/2017 08/10/2017  Decreased Interest 2 2 1   Down, Depressed, Hopeless 1 2 1   PHQ - 2 Score 3 4 2   Altered sleeping 3 3 2   Tired, decreased energy 3 3 2   Change in appetite 1 2 1   Feeling bad or failure  about yourself  1 1 1   Trouble concentrating 1 1 2   Moving slowly or fidgety/restless 1 1 1   Suicidal thoughts 0 0 0  PHQ-9 Score 13 15 11   Difficult doing work/chores - Somewhat difficult Somewhat difficult  Some recent data might be hidden     10. Coronary artery disease due to lipid rich plaque  Has not seen cardiology as of late- says is to busy to go right now  6. Bilateral carotid artery stenosis     Outpatient Encounter Medications as of 01/11/2018  Medication Sig  . albuterol (PROVENTIL HFA;VENTOLIN HFA) 108 (90 Base) MCG/ACT inhaler Inhale 2 puffs into the lungs every 6 (six) hours as needed for wheezing or shortness of breath.  . ALPRAZolam (XANAX) 1 MG tablet TAKE 1 TABLET BY MOUTH TWICE DAILY AND 1/2 TABLET AS NEEDED  . aspirin 325 MG tablet Take 325 mg by mouth daily.    [name] atorvastatin (LIPITOR) 80 MG tablet Take 1 tablet (80 mg total) by mouth daily.  . Cholecalciferol (VITAMIN D3) 5000 units TABS Take 1 tablet by mouth daily.  [name] escitalopram (LEXAPRO) 20 MG tablet Take 1 tablet (20 mg total) by mouth daily.  . fish oil-omega-3 fatty acids 1000 MG capsule Take 2 g by mouth 2 (two) times daily.   [name]  fluticasone furoate-vilanterol (BREO ELLIPTA) 100-25 MCG/INH AEPB Inhale 1 puff into the lungs daily.  . isosorbide mononitrate (IMDUR) 30 MG 24 hr tablet Take 0.5 tablets (15 mg total) by mouth daily.  [name] lisinopril (PRINIVIL,ZESTRIL) 20 MG tablet Take 1 tablet (20 mg total) by mouth daily.  . metFORMIN (GLUCOPHAGE) 500 MG tablet Take 1 tablet (500 mg total) by mouth 2 (two) times daily with a meal.  . metoprolol tartrate (LOPRESSOR) 25 MG tablet Take 1 tablet (25 mg total) by mouth 2 (two) times daily.  . pramipexole (MIRAPEX) 0.5 MG tablet Take 1 tablet (0.5 mg total) by mouth 2 (two) times daily.  . vitamin B-12 (CYANOCOBALAMIN) 1000 MCG tablet Take 1,000 mcg by mouth daily.     New complaints: None today  Social history: Lives alone. Her sister passed away last year  and she is still trying to settle her estate.    Review of Systems  Constitutional: Negative for activity change and appetite change.  HENT: Negative.   Eyes: Negative for pain.  Respiratory: Negative for shortness of breath.   Cardiovascular: Negative for chest pain, palpitations and leg swelling.  Gastrointestinal: Negative for abdominal pain.  Endocrine: Negative for polydipsia.  Genitourinary: Negative.   Skin: Negative for rash.  Neurological: Negative for dizziness, weakness and headaches.  Hematological: Does not bruise/bleed easily.  Psychiatric/Behavioral: Negative.   All other systems reviewed and are negative.      Objective:   Physical Exam  Constitutional: She is oriented to person, place, and time.  HENT:  Head: Normocephalic.  Nose: Nose normal.  Mouth/Throat: Oropharynx is clear and moist.  edmeatous parotid gland on right- nontender  Eyes: Pupils are equal, round, and reactive to light. EOM are normal.  Neck: Normal range of motion. Neck supple. No JVD present. Carotid bruit is not present.  Cardiovascular: Normal rate, regular rhythm, normal heart sounds and intact distal pulses.  Pulmonary/Chest: Effort normal and breath sounds normal. No respiratory distress. She has no wheezes. She has no rales. She exhibits no tenderness.  Abdominal: Soft. Normal appearance, normal aorta and bowel sounds are normal. She exhibits no distension, no abdominal bruit, no pulsatile midline mass and no mass. There is no splenomegaly or hepatomegaly. There is no tenderness.  Musculoskeletal: Normal range of motion. She exhibits no edema.  Lymphadenopathy:    She has no cervical adenopathy.  Neurological: She is alert and oriented to person, place, and time. She has normal reflexes.  Skin: Skin is warm and dry. Capillary refill takes less than 2 seconds.  Psychiatric: She has a normal mood and affect. Her behavior is normal. Judgment and thought content normal.   BP (!) 143/80    Pulse 62   Temp (!) 96.9 F (36.1 C) (Oral)   Ht 5' 3"  (1.6 m)   Wt 202 lb (91.6 kg)   LMP 10/17/2005   BMI 35.78 kg/m   hgba1c 5.9%       Assessment & Plan:  [name] comes in today with chief complaint of Medical Management of Chronic Issues   Diagnosis and orders addressed:  1. Type 2 diabetes mellitus with other circulatory complication, without long-term current use of insulin (HCC) continue to watch carbsin diet - Bayer DCA Hb A1c Waived - Microalbumin / creatinine urine ratio - metFORMIN (GLUCOPHAGE) 500 MG tablet; Take 1 tablet (500 mg total) by mouth 2 (two) times daily with a meal.  Dispense: 180 tablet; Refill: 1  2. Pure hypercholesterolemia Low fat diet - Lipid  panel - atorvastatin (LIPITOR) 80 MG tablet; Take 1 tablet (80 mg total) by mouth daily.  Dispense: 90 tablet; Refill: 1 - metoprolol tartrate (LOPRESSOR) 25 MG tablet; Take 1 tablet (25 mg total) by mouth 2 (two) times daily.  Dispense: 180 tablet; Refill: 1   3. Essential hypertension Low sodium diet - CMP14+EGFR - lisinopril (PRINIVIL,ZESTRIL) 20 MG tablet; Take 1 tablet (20 mg total) by mouth daily.  Dispense: 90 tablet; Refill: 1  4. Chronic obstructive pulmonary disease, unspecified COPD type ([employer]) Stop smoking - fluticasone furoate-vilanterol (BREO ELLIPTA) 100-25 MCG/INH AEPB; Inhale 1 puff into the lungs daily.  Dispense: 60 each; Refill: 3  5. Vitamin D deficiency Continue daily vitamin d OTC  6. Restless leg syndrome Keep legs warm at night - pramipexole (MIRAPEX) 0.5 MG tablet; Take 1 tablet (0.5 mg total) by mouth 2 (two) times daily.  Dispense: 180 tablet; Refill: 1  7. Morbid obesity ([place]) Discussed diet and exercise for person with BMI >25 Will recheck weight in 3-6 months  8. Generalized anxiety disorder Stress management - ALPRAZolam (XANAX) 1 MG tablet; TAKE 1 TABLET BY MOUTH TWICE DAILY AND 1/2 TABLET AS NEEDED  Dispense: 75 tablet; Refill: 2 - escitalopram  (LEXAPRO) 20 MG tablet; Take 1 tablet (20 mg total) by mouth daily.  Dispense: 90 tablet; Refill: 1  9. Recurrent major depressive disorder, in full remission ([place]) Continue lexapro as rx  10. Coronary artery disease due to lipid rich plaque  11. Bilateral carotid artery stenosis  12. Atherosclerosis of native coronary artery without angina pectoris, unspecified whether native or transplanted heart - isosorbide mononitrate (IMDUR) 30 MG 24 hr tablet; Take 0.5 tablets (15 mg total) by mouth daily.  Dispense: 90 tablet; Refill: 1  13. Enlarged parotid gland Patient has already seen ent for this- she will call and make another appointment to discuss removal   Labs pending Health Maintenance reviewed Diet and exercise encouraged  Follow up plan: 6 months   [place], FNP

## 2018-01-12 LAB — CMP14+EGFR
ALBUMIN: 4.1 g/dL (ref 3.6–4.8)
ALT: 11 IU/L (ref 0–32)
AST: 12 IU/L (ref 0–40)
Albumin/Globulin Ratio: 1.8 (ref 1.2–2.2)
Alkaline Phosphatase: 78 IU/L (ref 39–117)
BUN / CREAT RATIO: 19 (ref 12–28)
BUN: 16 mg/dL (ref 8–27)
Bilirubin Total: 0.3 mg/dL (ref 0.0–1.2)
CO2: 23 mmol/L (ref 20–29)
CREATININE: 0.84 mg/dL (ref 0.57–1.00)
Calcium: 9.3 mg/dL (ref 8.7–10.3)
Chloride: 104 mmol/L (ref 96–106)
GFR calc Af Amer: 87 mL/min/{1.73_m2} (ref >59)
GFR calc non Af Amer: 75 mL/min/{1.73_m2} (ref >59)
GLUCOSE: 131 mg/dL — AB (ref 65–99)
Globulin, Total: 2.3 g/dL (ref 1.5–4.5)
Potassium: 4.5 mmol/L (ref 3.5–5.2)
Sodium: 142 mmol/L (ref 134–144)
Total Protein: 6.4 g/dL (ref 6.0–8.5)

## 2018-01-12 LAB — LIPID PANEL
CHOLESTEROL TOTAL: 133 mg/dL (ref 100–199)
Chol/HDL Ratio: 3.8 ratio (ref 0.0–4.4)
HDL: 35 mg/dL — AB (ref >39)
LDL CALC: 60 mg/dL (ref 0–99)
TRIGLYCERIDES: 192 mg/dL — AB (ref 0–149)
VLDL CHOLESTEROL CAL: 38 mg/dL (ref 5–40)

## 2018-02-01 ENCOUNTER — Ambulatory Visit: Payer: Medicare HMO | Admitting: *Deleted

## 2018-02-03 ENCOUNTER — Encounter: Payer: Medicare HMO | Admitting: *Deleted

## 2018-02-11 ENCOUNTER — Ambulatory Visit (INDEPENDENT_AMBULATORY_CARE_PROVIDER_SITE_OTHER): Payer: Medicare HMO | Admitting: *Deleted

## 2018-02-11 VITALS — BP 138/77 | HR 64 | Temp 98.3°F | Ht 63.0 in | Wt 204.2 lb

## 2018-02-11 DIAGNOSIS — Z0000 Encounter for general adult medical examination without abnormal findings: Secondary | ICD-10-CM | POA: Diagnosis not present

## 2018-02-11 NOTE — Progress Notes (Addendum)
Subjective:   [name] is a 62 y.o. female who presents for Medicare Annual (Subsequent) preventive examination. [name] is retired from the [place] [employer]. She enjoys playing with her grandchildren. She lives alone. She has one daughter and 2 grandchildren. Her daughter is getting ready to move close to her and she is looking forward to her grandchildren being close. Her sister passed away last year and it has really been difficult on patient. She does have stairs in the home going into the basement which do not have handrails and I discussed the importance with the patient of having handrails.   Review of Systems:  She reports that her health is the about the same as last year. She is still really struggling with depression.   Cardiac Risk Factors include: diabetes mellitus;hypertension;obesity (BMI >30kg/m2);smoking/ tobacco exposure  Psych: anxiety, insomnia, and depression related to her sisters death over a year ago. Patient is still having a really hard time.    Objective:     Vitals: BP 138/77   Pulse 64   Temp 98.3 F (36.8 C) (Oral)   Ht 5' 3"  (1.6 m)   Wt 204 lb 3.2 oz (92.6 kg)   LMP 10/17/2005   BMI 36.17 kg/m   Body mass index is 36.17 kg/m.  Advanced Directives 02/11/2018 08/10/2017 07/16/2017 01/29/2017 04/18/2015 07/24/2011 06/02/2011  Does Patient Have a Medical Advance Directive? Yes Yes Yes No No Patient does not have advance directive;Patient would like information Patient does not have advance directive  Type of Advance Directive [place];Living will [place];Living will Living will - - - -  Does patient want to make changes to medical advance directive? - No - Patient declined No - Patient declined - - - -  Copy of [place] in Chart? No - copy requested No - copy requested - - - - -  Would patient like information on creating a medical advance directive? - - - Yes (MAU/Ambulatory/Procedural  Areas - Information given) Yes - Educational materials given Advance directive packet given -  Pre-existing out of facility DNR order (yellow form or pink MOST form) - - - - - - No    Tobacco Social History   Tobacco Use  Smoking Status Current Every Day Smoker  . Packs/day: 1.00  . Years: 35.00  . Pack years: 35.00  . Types: Cigarettes  Smokeless Tobacco Never Used     Ready to quit: Not Answered Counseling given: Not Answered Patient is not ready to quit at this time     Past Medical History:  Diagnosis Date  . Anxiety   . Breast cancer ([place]) 06/30/11   inv ductal, ER/PR +, her-2 -  . CAD (coronary artery disease)    NSTEMI 4/08: OM3 occluded (PCI unsuccessful), dRCA 95% => BMS, inf HK, EF 50%;   b. MV 11/12:  [place] ischemia => c.  [place] 11/12:  dLAD 70%, OM1 70-80%, then occluded (no change from 2008), dOM filled L->L collats, mRCA stent occluded, dRCA filled L->R collats, EF 55-65% => med Rx (consider PCI of RCA if refractory angina)  . Carotid stenosis    dopplers 5/08: 0-30% bilateral; 10/12: 0-39% B/L ICA => f/u 04/2013  . Chronic kidney disease   . COPD (chronic obstructive pulmonary disease) ([place])   . Depression   . Depression with anxiety   . GERD (gastroesophageal reflux disease)    OTC acid reducer prn  . Headache(784.0)  sinus; occ. migraines  . Hx of radiation therapy 08/24/11 to 10/07/11   L breast  . Hyperlipidemia   . Hypertension    under control; has been on med. x 4 yrs.  . Myocardial infarction ([place])   . Stable angina ([place])    a. med Rx after cath 05/2011  . Stress incontinence, female    Past Surgical History:  Procedure Laterality Date  . AXILLARY LYMPH NODE DISSECTION  07/31/2011   Procedure: AXILLARY LYMPH NODE DISSECTION;  Surgeon: [name], MD;  Location: [facility];  Service: General;  Laterality: Left;  left axillary sentinal node biopsy  . BREAST SURGERY    . CARDIAC CATHETERIZATION  11/02/2006; 06/03/2011  . CESAREAN  SECTION  1987  . CORONARY STENT PLACEMENT  11/02/2006  . ELBOW SURGERY     right  . MASTECTOMY PARTIAL / LUMPECTOMY  06/30/2011   left   Family History  Problem Relation Age of Onset  . Heart disease Mother   . Heart disease Father   . Cancer Maternal Aunt        pt unaware of what kind  . Cancer Maternal Grandmother        ovarian  . Heart attack Sister   . Bipolar disorder Sister    Social History   Socioeconomic History  . Marital status: Single    Spouse name: Not on file  . Number of children: 1  . Years of education: Not on file  . Highest education level: Not on file  Occupational History  . Not on file  Social Needs  . Financial resource strain: Very hard  . Food insecurity:    Worry: Often true    Inability: Often true  . Transportation needs:    Medical: No    Non-medical: No  Tobacco Use  . Smoking status: Current Every Day Smoker    Packs/day: 1.00    Years: 35.00    Pack years: 35.00    Types: Cigarettes  . Smokeless tobacco: Never Used  Substance and Sexual Activity  . Alcohol use: Yes    Alcohol/week: 0.6 oz    Types: 1 Glasses of wine per week    Comment: occasionally  . Drug use: No  . Sexual activity: Never    Birth control/protection: Post-menopausal  Lifestyle  . Physical activity:    Days per week: 0 days    Minutes per session: 0 min  . Stress: Very much  Relationships  . Social connections:    Talks on phone: More than three times a week    Gets together: Once a week    Attends religious service: Never    Active member of club or organization: No    Attends meetings of clubs or organizations: Never    Relationship status: Never married  Other Topics Concern  . Not on file  Social History Narrative  . Not on file    Outpatient Encounter Medications as of 02/11/2018  Medication Sig  . albuterol (PROVENTIL HFA;VENTOLIN HFA) 108 (90 Base) MCG/ACT inhaler Inhale 2 puffs into the lungs every 6 (six) hours as needed for wheezing or  shortness of breath.  . ALPRAZolam (XANAX) 1 MG tablet TAKE 1 TABLET BY MOUTH TWICE DAILY AND 1/2 TABLET AS NEEDED  . aspirin 325 MG tablet Take 325 mg by mouth daily.    [name] atorvastatin (LIPITOR) 80 MG tablet Take 1 tablet (80 mg total) by mouth daily.  . Cholecalciferol (VITAMIN D3) 5000 units TABS  Take 1 tablet by mouth daily.  [name] escitalopram (LEXAPRO) 20 MG tablet Take 1 tablet (20 mg total) by mouth daily.  . fish oil-omega-3 fatty acids 1000 MG capsule Take 2 g by mouth 2 (two) times daily.   . fluticasone furoate-vilanterol (BREO ELLIPTA) 100-25 MCG/INH AEPB Inhale 1 puff into the lungs daily.  . isosorbide mononitrate (IMDUR) 30 MG 24 hr tablet Take 0.5 tablets (15 mg total) by mouth daily.  [name] lisinopril (PRINIVIL,ZESTRIL) 20 MG tablet Take 1 tablet (20 mg total) by mouth daily.  . metFORMIN (GLUCOPHAGE) 500 MG tablet Take 1 tablet (500 mg total) by mouth 2 (two) times daily with a meal.  . metoprolol tartrate (LOPRESSOR) 25 MG tablet Take 1 tablet (25 mg total) by mouth 2 (two) times daily.  . pramipexole (MIRAPEX) 0.5 MG tablet Take 1 tablet (0.5 mg total) by mouth 2 (two) times daily.  . vitamin B-12 (CYANOCOBALAMIN) 1000 MCG tablet Take 1,000 mcg by mouth daily.   No facility-administered encounter medications on file as of 02/11/2018.     Activities of Daily Living In your present state of health, do you have any difficulty performing the following activities: 02/11/2018 08/10/2017  Hearing? Y N  Comment patient states she has a slight decrease in hearing  -  Vision? N N  Comment wears glasses -  Difficulty concentrating or making decisions? Y N  Comment Patient states she has trouble with all these -  Walking or climbing stairs? Y N  Comment Due to knee pain and COPD  -  Dressing or bathing? N N  Doing errands, shopping? N N  Preparing Food and eating ? N -  Using the Toilet? N -  In the past six months, have you accidently leaked urine? N -  Do you have problems with loss  of bowel control? N -  Managing your Medications? N -  Managing your Finances? N -  Comment - -  Housekeeping or managing your Housekeeping? N -  Some recent data might be hidden    Patient Care Team: [name], FNP as PCP - General (Family Medicine) Nat Christen, MD as Attending Physician (Optometry)    Assessment:   This is a routine wellness examination for [name].  Exercise Activities and Dietary recommendations Current Exercise Habits: The patient does not participate in regular exercise at present  Goals    . Exercise 3x per week (30 min per time)       Fall Risk Fall Risk  02/11/2018 01/11/2018 11/22/2017 08/11/2017 08/10/2017  Falls in the past year? No No No No No  Number falls in past yr: - - - - -  Injury with Fall? - - - No -  Risk for fall due to : - - - Medication side effect -  Follow up - - - Falls evaluation completed -   Is the patient's home free of loose throw rugs in walkways, pet beds, electrical cords, etc?   yes      Grab bars in the bathroom? no      Handrails on the stairs?   no-discussed with patient the importance of handrails       Adequate lighting?   yes  Timed Get Up and Go performed:   Depression Screen PHQ 2/9 Scores 02/11/2018 01/11/2018 11/22/2017 08/10/2017  PHQ - 2 Score 4 3 4 2   PHQ- 9 Score 18 13 15 11    Patient states that she does not have a plan to hurt herself. She  is really struggling with her depression at this time and appointment was scheduled with Dr. Evette Doffing to discuss.   Cognitive Function MMSE - Mini Mental State Exam 02/11/2018 01/29/2017 04/18/2015  Orientation to time 5 5 5   Orientation to Place 5 5 5   Registration 3 3 3   Attention/ Calculation 5 5 5   Recall 3 3 3   Language- name 2 objects 2 2 2   Language- repeat 1 1 1   Language- follow 3 step command 3 3 3   Language- read & follow direction 1 1 1   Write a sentence 1 1 1   Copy design 1 1 1   Total score 30 30 30         Immunization History    Administered Date(s) Administered  . Tdap 05/17/2001    Qualifies for Shingles Vaccine?Yes. Patient would like to think about   Screening Tests Health Maintenance  Topic Date Due  . PAP SMEAR  05/24/2004  . MAMMOGRAM  12/22/2014  . INFLUENZA VACCINE  06/15/2018 (Originally 02/24/2018)  . PNEUMOCOCCAL POLYSACCHARIDE VACCINE (1) 01/12/2019 (Originally 08/22/1957)  . COLONOSCOPY  01/12/2019 (Originally 02/14/2017)  . TETANUS/TDAP  01/12/2019 (Originally 05/18/2011)  . LIPID PANEL  04/13/2018  . HEMOGLOBIN A1C  07/13/2018  . OPHTHALMOLOGY EXAM  01/05/2019  . FOOT EXAM  01/12/2019  . Hepatitis C Screening  Completed  . HIV Screening  Completed  Patient declined pneumonia and tetanus vaccine  We are getting [facility] from [facility] for Mammogram.  Cancer Screenings: Lung: Low Dose CT Chest recommended if Age 56-80 years, 30 pack-year currently smoking OR have quit w/in 15years. Patient does qualify. Breast:  Up to date on Mammogram? Yes   Up to date of Bone Density/Dexa? No Colorectal: not up to date. Colorectal information given to patient  Additional Screenings: Hepatitis C Screening:      Plan:  Patient will get Dexa at next appointment  Patient will see Dr. Evette Doffing on Monday to discuss depression Patient will think about cologuard and let us know if interested  I have personally reviewed and noted the following in the patient's chart:   . Medical and social history . Use of alcohol, tobacco or illicit drugs  . Current medications and supplements . Functional ability and status . Nutritional status . Physical activity . Advanced directives . List of other physicians . Hospitalizations, surgeries, and ER visits in previous 12 months . Vitals . Screenings to include cognitive, depression, and falls . Referrals and appointments  In addition, I have reviewed and discussed with patient certain preventive protocols, quality metrics, and best practice recommendations.  A written personalized care plan for preventive services as well as general preventive health recommendations were provided to patient.     Gareth Morgan, LPN  [id number]   I have reviewed and agree with the above AWV documentation.   Assunta Found, MD [place]

## 2018-02-11 NOTE — Patient Instructions (Addendum)
  [name] , Thank you for taking time to come for your Medicare Wellness Visit. I appreciate your ongoing commitment to your health goals. Please review the following plan we discussed and let me know if I can assist you in the future.   These are the goals we discussed: Goals    . Exercise 3x per week (30 min per time)       This is a list of the screening recommended for you and due dates:  Health Maintenance  Topic Date Due  . Pap Smear  05/24/2004  . Mammogram  12/22/2014  . Flu Shot  06/15/2018*  . Pneumococcal vaccine (1) 01/12/2019*  . Colon Cancer Screening  01/12/2019*  . Tetanus Vaccine  01/12/2019*  . Lipid (cholesterol) test  04/13/2018  . Hemoglobin A1C  07/13/2018  . Eye exam for diabetics  01/05/2019  . Complete foot exam   01/12/2019  .  Hepatitis C: One time screening is recommended by Center for Disease Control  (CDC) for  adults born from [birth year] through 1964.   Completed  . HIV Screening  Completed  *Topic was postponed. The date shown is not the original due date.   Think about cologuard and let us know Try to exercise as much as possible

## 2018-02-14 ENCOUNTER — Ambulatory Visit: Payer: Medicare HMO | Admitting: Pediatrics

## 2018-02-17 ENCOUNTER — Ambulatory Visit: Payer: Medicare HMO | Admitting: Pediatrics

## 2018-02-18 ENCOUNTER — Encounter: Payer: Self-pay | Admitting: Family Medicine

## 2018-02-23 ENCOUNTER — Telehealth: Payer: Self-pay | Admitting: Nurse Practitioner

## 2018-02-23 DIAGNOSIS — F411 Generalized anxiety disorder: Secondary | ICD-10-CM

## 2018-02-23 NOTE — Telephone Encounter (Signed)
Please advise, I did not see a pain management agreement in patient's chart, so i'm not sure she knows the rule about changing pharmacies.

## 2018-02-24 MED ORDER — ALPRAZOLAM 1 MG PO TABS: ORAL_TABLET | ORAL | 2 refills | Status: DC

## 2018-03-07 ENCOUNTER — Telehealth: Payer: Self-pay | Admitting: Nurse Practitioner

## 2018-03-07 NOTE — Telephone Encounter (Signed)
Pt notified paperwork faxed in and confirmation recieved

## 2018-03-07 NOTE — Telephone Encounter (Signed)
Has to be in by 5PM today.

## 2018-03-09 ENCOUNTER — Ambulatory Visit (INDEPENDENT_AMBULATORY_CARE_PROVIDER_SITE_OTHER): Payer: Medicare HMO | Admitting: Family Medicine

## 2018-03-09 ENCOUNTER — Encounter: Payer: Self-pay | Admitting: Family Medicine

## 2018-03-09 VITALS — BP 182/77 | HR 72 | Temp 97.3°F | Ht 63.0 in | Wt 207.2 lb

## 2018-03-09 DIAGNOSIS — L03211 Cellulitis of face: Secondary | ICD-10-CM

## 2018-03-09 DIAGNOSIS — S39012A Strain of muscle, fascia and tendon of lower back, initial encounter: Secondary | ICD-10-CM | POA: Diagnosis not present

## 2018-03-09 MED ORDER — CEPHALEXIN 500 MG PO CAPS: 500.0000 mg | ORAL_CAPSULE | Freq: Four times a day (QID) | ORAL | 0 refills | Status: DC

## 2018-03-09 MED ORDER — CYCLOBENZAPRINE HCL 10 MG PO TABS: 10.0000 mg | ORAL_TABLET | Freq: Three times a day (TID) | ORAL | 0 refills | Status: DC | PRN

## 2018-03-09 NOTE — Progress Notes (Signed)
BP (!) 182/77   Pulse 72   Temp (!) 97.3 F (36.3 C) (Oral)   Ht 5\' 3"  (1.6 m)   Wt 207 lb 3.2 oz (94 kg)   LMP 10/17/2005   BMI 36.70 kg/m    Subjective:    Patient ID: [name], female    DOB: May 16, 1956, 62 y.o.   MRN: [id number]  HPI: [name] is a 62 y.o. female presenting on 03/09/2018 for Back Pain (x 2-3 weeks on and off. Patient states it will wake her up at night due to the pain.) and Cyst (Right cheek that has been there a few months- MMM has seen it. Patient states that she scratched it x 3 days ago and puss came out of it. Wants to get it checked to make sure it is not infected. Has apt with Dr. Redmond Baseman to have it removed)   HPI Low back pain Patient comes in complaining of low back pain that is been going on for the past 3 weeks on and off.  She says that there have been some nights where it wakes her up at night and she describes it as an achy pain that starts on her right lower back but then goes across her back to the other side.  She denies any shooting pain down either legs or numbness or weakness.  She mainly has the pain at night and not as much during the day.  She denies any specific trauma or overworking or anything that brought this on but she is just been having it intermittently over the past few weeks.  Right face cyst Patient has a cyst on her right lower cheek that has been causing her problems for some time.  She says that it drained purulent material 3 days ago when she scratched it and since then she has been having concerns about the spot and that is become red and warm around the spot concerned about infection.  She denies any fevers or chills or redness or warmth anywhere else except for immediately around cyst.  She says that she was going to dermatology to get the cyst removed eventually but then it became red and warm and so she came in today.  Relevant past medical, surgical, family and social history reviewed and updated as indicated.  Interim medical history since our last visit reviewed. Allergies and medications reviewed and updated.  Review of Systems  Constitutional: Negative for chills and fever.  Eyes: Negative for redness and visual disturbance.  Respiratory: Negative for chest tightness and shortness of breath.   Cardiovascular: Negative for chest pain and leg swelling.  Musculoskeletal: Positive for back pain and myalgias. Negative for gait problem.  Skin: Positive for color change (Erythema around skin lesion on right lower face). Negative for rash and wound.  Neurological: Negative for weakness, light-headedness, numbness and headaches.  Psychiatric/Behavioral: Negative for agitation and behavioral problems.  All other systems reviewed and are negative.   Per HPI unless specifically indicated above   Allergies as of 03/09/2018      Reactions   Food Other (See Comments)   Nutmeg= difficulty breathing      Medication List        Accurate as of 03/09/18  2:42 PM. Always use your most recent med list.          albuterol 108 (90 Base) MCG/ACT inhaler Commonly known as:  PROVENTIL HFA;VENTOLIN HFA Inhale 2 puffs into the lungs every 6 (six) hours as needed  for wheezing or shortness of breath.   ALPRAZolam 1 MG tablet Commonly known as:  XANAX TAKE 1 TABLET BY MOUTH TWICE DAILY AND 1/2 TABLET AS NEEDED   aspirin 325 MG tablet Take 325 mg by mouth daily.   atorvastatin 80 MG tablet Commonly known as:  LIPITOR Take 1 tablet (80 mg total) by mouth daily.   cephALEXin 500 MG capsule Commonly known as:  KEFLEX Take 1 capsule (500 mg total) by mouth 4 (four) times daily.   cyclobenzaprine 10 MG tablet Commonly known as:  FLEXERIL Take 1 tablet (10 mg total) by mouth 3 (three) times daily as needed for muscle spasms.   escitalopram 20 MG tablet Commonly known as:  LEXAPRO Take 1 tablet (20 mg total) by mouth daily.   fish oil-omega-3 fatty acids 1000 MG capsule Take 2 g by mouth 2 (two) times  daily.   fluticasone furoate-vilanterol 100-25 MCG/INH Aepb Commonly known as:  BREO ELLIPTA Inhale 1 puff into the lungs daily.   isosorbide mononitrate 30 MG 24 hr tablet Commonly known as:  IMDUR Take 0.5 tablets (15 mg total) by mouth daily.   lisinopril 20 MG tablet Commonly known as:  PRINIVIL,ZESTRIL Take 1 tablet (20 mg total) by mouth daily.   metFORMIN 500 MG tablet Commonly known as:  GLUCOPHAGE Take 1 tablet (500 mg total) by mouth 2 (two) times daily with a meal.   metoprolol tartrate 25 MG tablet Commonly known as:  LOPRESSOR Take 1 tablet (25 mg total) by mouth 2 (two) times daily.   pramipexole 0.5 MG tablet Commonly known as:  MIRAPEX Take 1 tablet (0.5 mg total) by mouth 2 (two) times daily.   vitamin B-12 1000 MCG tablet Commonly known as:  CYANOCOBALAMIN Take 1,000 mcg by mouth daily.   Vitamin D3 5000 units Tabs Take 1 tablet by mouth daily.          Objective:    BP (!) 182/77   Pulse 72   Temp (!) 97.3 F (36.3 C) (Oral)   Ht 5\' 3"  (1.6 m)   Wt 207 lb 3.2 oz (94 kg)   LMP 10/17/2005   BMI 36.70 kg/m   Wt Readings from Last 3 Encounters:  03/09/18 207 lb 3.2 oz (94 kg)  02/11/18 204 lb 3.2 oz (92.6 kg)  01/11/18 202 lb (91.6 kg)    Physical Exam  Constitutional: She is oriented to person, place, and time. She appears well-developed and well-nourished. No distress.  Eyes: Conjunctivae are normal.  Neck: Neck supple. No thyromegaly present.  Cardiovascular: Normal rate, regular rhythm, normal heart sounds and intact distal pulses.  No murmur heard. Pulmonary/Chest: Effort normal and breath sounds normal. No respiratory distress. She has no wheezes.  Musculoskeletal: Normal range of motion. She exhibits edema (, Possible SI joint dysfunction based on location.  Negative straight leg raise bilaterally).  Lymphadenopathy:    She has no cervical adenopathy.  Neurological: She is alert and oriented to person, place, and time. Coordination  normal.  Skin: Skin is warm and dry. Lesion (Small area of induration on the right lower cheek with small area of erythema surrounding it.) noted. No rash noted. She is not diaphoretic. There is erythema.  Psychiatric: She has a normal mood and affect. Her behavior is normal.  Nursing note and vitals reviewed.       Assessment & Plan:   Problem List Items Addressed This Visit    None    Visit Diagnoses    Cellulitis, face    -  Primary   Right cheek cyst, has erythema around it and a small amount of induration, will treat with Keflex   Relevant Medications   cephALEXin (KEFLEX) 500 MG capsule   Strain of lumbar region, initial encounter       Relevant Medications   cyclobenzaprine (FLEXERIL) 10 MG tablet       Follow up plan: Return if symptoms worsen or fail to improve.  Counseling provided for all of the vaccine components No orders of the defined types were placed in this encounter.   Caryl Pina, MD [facility] 03/09/2018, 2:42 PM

## 2018-04-16 DIAGNOSIS — R0602 Shortness of breath: Secondary | ICD-10-CM | POA: Diagnosis not present

## 2018-04-16 DIAGNOSIS — J22 Unspecified acute lower respiratory infection: Secondary | ICD-10-CM | POA: Diagnosis not present

## 2018-04-16 DIAGNOSIS — R0603 Acute respiratory distress: Secondary | ICD-10-CM | POA: Diagnosis not present

## 2018-04-22 ENCOUNTER — Ambulatory Visit (INDEPENDENT_AMBULATORY_CARE_PROVIDER_SITE_OTHER): Payer: Medicare HMO | Admitting: Family Medicine

## 2018-04-22 ENCOUNTER — Encounter: Payer: Self-pay | Admitting: Family Medicine

## 2018-04-22 VITALS — BP 150/79 | HR 74 | Temp 97.0°F | Ht 63.0 in | Wt 203.8 lb

## 2018-04-22 DIAGNOSIS — J441 Chronic obstructive pulmonary disease with (acute) exacerbation: Secondary | ICD-10-CM | POA: Diagnosis not present

## 2018-04-22 MED ORDER — PREDNISONE 20 MG PO TABS: ORAL_TABLET | ORAL | 0 refills | Status: DC

## 2018-04-25 NOTE — Progress Notes (Signed)
BP (!) 150/79   Pulse 74   Temp (!) 97 F (36.1 C) (Oral)   Ht 5\' 3"  (1.6 m)   Wt 203 lb 12.8 oz (92.4 kg)   LMP 10/17/2005   SpO2 (!) 89%   BMI 36.10 kg/m    Subjective:    Patient ID: [name], female    DOB: [birth year], 62 y.o.   MRN: [id number]  HPI: [name] is a 62 y.o. female presenting on 04/22/2018 for Hospitalization Follow-up [facility] urgent care 9/21- SOB)   HPI  Hospital follow-up for COPD exacerbation Patient is coming in today for hospital follow-up/ER follow-up for COPD exacerbation.  Patient was seen in the ED on 04/16/2018 at [facility] him.  We do not have the hospital report but the patient does have some information that they brought on and after visit summary from where they were seen.  Patient was given Levaquin and a Depo-Medrol injection while there at the ER.  Patient was identified as COPD/possible pneumonia and treated as such.  she has continued to use her inhalers.  She says they are helping and the Levaquin and the steroids helped her get better for 2 days but then she feels like she is starting to have her cough and wheezing coming back today.  She denies any fevers or chills.  She says her cough is productive and she has some minimal shortness of breath is still slightly better than when she went to the ER on that day but is starting to return.  She denies any chest pain or palpitations.  Relevant past medical, surgical, family and social history reviewed and updated as indicated. Interim medical history since our last visit reviewed. Allergies and medications reviewed and updated.  Review of Systems  Constitutional: Negative for chills and fever.  Eyes: Negative for visual disturbance.  Respiratory: Positive for cough, shortness of breath and wheezing. Negative for chest tightness.   Cardiovascular: Negative for chest pain and leg swelling.  Musculoskeletal: Negative for back pain and gait problem.  Skin: Negative for rash.    Neurological: Negative for light-headedness and headaches.  Psychiatric/Behavioral: Negative for agitation and behavioral problems.  All other systems reviewed and are negative.   Per HPI unless specifically indicated above   Allergies as of 04/22/2018      Reactions   Food Other (See Comments)   Nutmeg= difficulty breathing      Medication List        Accurate as of 04/22/18 11:59 PM. Always use your most recent med list.          albuterol 108 (90 Base) MCG/ACT inhaler Commonly known as:  PROVENTIL HFA;VENTOLIN HFA Inhale 2 puffs into the lungs every 6 (six) hours as needed for wheezing or shortness of breath.   ALPRAZolam 1 MG tablet Commonly known as:  XANAX TAKE 1 TABLET BY MOUTH TWICE DAILY AND 1/2 TABLET AS NEEDED   aspirin 325 MG tablet Take 325 mg by mouth daily.   atorvastatin 80 MG tablet Commonly known as:  LIPITOR Take 1 tablet (80 mg total) by mouth daily.   cyclobenzaprine 10 MG tablet Commonly known as:  FLEXERIL Take 1 tablet (10 mg total) by mouth 3 (three) times daily as needed for muscle spasms.   escitalopram 20 MG tablet Commonly known as:  LEXAPRO Take 1 tablet (20 mg total) by mouth daily.   fish oil-omega-3 fatty acids 1000 MG capsule Take 2 g by mouth 2 (two) times daily.  fluticasone furoate-vilanterol 100-25 MCG/INH Aepb Commonly known as:  BREO ELLIPTA Inhale 1 puff into the lungs daily.   isosorbide mononitrate 30 MG 24 hr tablet Commonly known as:  IMDUR Take 0.5 tablets (15 mg total) by mouth daily.   levofloxacin 500 MG tablet Commonly known as:  LEVAQUIN Take 1 tablet by mouth daily at 2 PM.   lisinopril 20 MG tablet Commonly known as:  PRINIVIL,ZESTRIL Take 1 tablet (20 mg total) by mouth daily.   metFORMIN 500 MG tablet Commonly known as:  GLUCOPHAGE Take 1 tablet (500 mg total) by mouth 2 (two) times daily with a meal.   metoprolol tartrate 25 MG tablet Commonly known as:  LOPRESSOR Take 1 tablet (25 mg total)  by mouth 2 (two) times daily.   pramipexole 0.5 MG tablet Commonly known as:  MIRAPEX Take 1 tablet (0.5 mg total) by mouth 2 (two) times daily.   predniSONE 20 MG tablet Commonly known as:  DELTASONE Take 3 tabs daily for 1 week, then 2 tabs daily for week 2, then 1 tab daily for week 3.   vitamin B-12 1000 MCG tablet Commonly known as:  CYANOCOBALAMIN Take 1,000 mcg by mouth daily.   Vitamin D3 5000 units Tabs Take 1 tablet by mouth daily.          Objective:    BP (!) 150/79   Pulse 74   Temp (!) 97 F (36.1 C) (Oral)   Ht 5\' 3"  (1.6 m)   Wt 203 lb 12.8 oz (92.4 kg)   LMP 10/17/2005   SpO2 (!) 89%   BMI 36.10 kg/m   Wt Readings from Last 3 Encounters:  04/22/18 203 lb 12.8 oz (92.4 kg)  03/09/18 207 lb 3.2 oz (94 kg)  02/11/18 204 lb 3.2 oz (92.6 kg)    Physical Exam  Constitutional: She is oriented to person, place, and time. She appears well-developed and well-nourished. No distress.  Eyes: Conjunctivae are normal.  Neck: Neck supple. No thyromegaly present.  Cardiovascular: Normal rate, regular rhythm, normal heart sounds and intact distal pulses.  No murmur heard. Pulmonary/Chest: Effort normal. No respiratory distress. She has wheezes (Mild end expiratory wheezes in upper lobes). She has no rales. She exhibits no tenderness.  Musculoskeletal: Normal range of motion. She exhibits no edema.  Lymphadenopathy:    She has no cervical adenopathy.  Neurological: She is alert and oriented to person, place, and time. Coordination normal.  Skin: Skin is warm and dry. No rash noted. She is not diaphoretic.  Psychiatric: She has a normal mood and affect. Her behavior is normal.  Nursing note and vitals reviewed.       Assessment & Plan:   Problem List Items Addressed This Visit    None    Visit Diagnoses    COPD exacerbation ([place])    -  Primary   Relevant Medications   predniSONE (DELTASONE) 20 MG tablet      Patient is still on Levaquin, with symptoms  returning will treat with course of prednisone to take home and will monitor from there.  Continue inhalers. Follow up plan: Return if symptoms worsen or fail to improve.  Counseling provided for all of the vaccine components No orders of the defined types were placed in this encounter.   Caryl Pina, MD [facility] 04/22/2018, 4:58 PM

## 2018-06-03 ENCOUNTER — Other Ambulatory Visit: Payer: Self-pay | Admitting: Nurse Practitioner

## 2018-06-03 DIAGNOSIS — F411 Generalized anxiety disorder: Secondary | ICD-10-CM

## 2018-06-06 ENCOUNTER — Other Ambulatory Visit: Payer: Self-pay | Admitting: Nurse Practitioner

## 2018-06-06 DIAGNOSIS — F411 Generalized anxiety disorder: Secondary | ICD-10-CM

## 2018-06-06 NOTE — Telephone Encounter (Signed)
VM not setup, refill was sent electronically this morning

## 2018-06-10 ENCOUNTER — Other Ambulatory Visit: Payer: Self-pay | Admitting: Nurse Practitioner

## 2018-06-10 DIAGNOSIS — I10 Essential (primary) hypertension: Secondary | ICD-10-CM

## 2018-07-11 ENCOUNTER — Other Ambulatory Visit: Payer: Self-pay | Admitting: Nurse Practitioner

## 2018-07-11 DIAGNOSIS — E785 Hyperlipidemia, unspecified: Secondary | ICD-10-CM

## 2018-07-14 ENCOUNTER — Ambulatory Visit: Payer: Medicare HMO | Admitting: Nurse Practitioner

## 2018-07-25 ENCOUNTER — Telehealth: Payer: Self-pay | Admitting: Nurse Practitioner

## 2018-07-25 DIAGNOSIS — E785 Hyperlipidemia, unspecified: Secondary | ICD-10-CM

## 2018-07-25 MED ORDER — ATORVASTATIN CALCIUM 80 MG PO TABS: 80.0000 mg | ORAL_TABLET | Freq: Every day | ORAL | 0 refills | Status: DC

## 2018-07-25 NOTE — Telephone Encounter (Signed)
Patient last seen June 2019, spoke with patient and scheduled a follow up for 08/12/18 with [name] and sent in a 30 day supply of Atorvastatin.

## 2018-07-28 ENCOUNTER — Other Ambulatory Visit: Payer: Self-pay | Admitting: Nurse Practitioner

## 2018-07-28 NOTE — Telephone Encounter (Signed)
Pt aware rx was sent to pharmacy 07/25/18.

## 2018-08-12 ENCOUNTER — Ambulatory Visit (INDEPENDENT_AMBULATORY_CARE_PROVIDER_SITE_OTHER): Payer: Medicare HMO | Admitting: Nurse Practitioner

## 2018-08-12 ENCOUNTER — Encounter: Payer: Self-pay | Admitting: Nurse Practitioner

## 2018-08-12 VITALS — BP 156/76 | HR 64 | Temp 96.6°F | Ht 63.0 in | Wt 209.0 lb

## 2018-08-12 DIAGNOSIS — E7800 Pure hypercholesterolemia, unspecified: Secondary | ICD-10-CM | POA: Diagnosis not present

## 2018-08-12 DIAGNOSIS — E1159 Type 2 diabetes mellitus with other circulatory complications: Secondary | ICD-10-CM | POA: Diagnosis not present

## 2018-08-12 DIAGNOSIS — J441 Chronic obstructive pulmonary disease with (acute) exacerbation: Secondary | ICD-10-CM

## 2018-08-12 DIAGNOSIS — I213 ST elevation (STEMI) myocardial infarction of unspecified site: Secondary | ICD-10-CM

## 2018-08-12 DIAGNOSIS — F411 Generalized anxiety disorder: Secondary | ICD-10-CM

## 2018-08-12 DIAGNOSIS — J449 Chronic obstructive pulmonary disease, unspecified: Secondary | ICD-10-CM

## 2018-08-12 DIAGNOSIS — I2510 Atherosclerotic heart disease of native coronary artery without angina pectoris: Secondary | ICD-10-CM

## 2018-08-12 DIAGNOSIS — I10 Essential (primary) hypertension: Secondary | ICD-10-CM

## 2018-08-12 DIAGNOSIS — F3342 Major depressive disorder, recurrent, in full remission: Secondary | ICD-10-CM

## 2018-08-12 DIAGNOSIS — G2581 Restless legs syndrome: Secondary | ICD-10-CM | POA: Diagnosis not present

## 2018-08-12 DIAGNOSIS — J45901 Unspecified asthma with (acute) exacerbation: Secondary | ICD-10-CM

## 2018-08-12 DIAGNOSIS — I6523 Occlusion and stenosis of bilateral carotid arteries: Secondary | ICD-10-CM | POA: Diagnosis not present

## 2018-08-12 DIAGNOSIS — E785 Hyperlipidemia, unspecified: Secondary | ICD-10-CM

## 2018-08-12 LAB — BAYER DCA HB A1C WAIVED: HB A1C (BAYER DCA - WAIVED): 6.1 % (ref <7.0)

## 2018-08-12 MED ORDER — LISINOPRIL 40 MG PO TABS: 40.0000 mg | ORAL_TABLET | Freq: Every day | ORAL | 3 refills | Status: DC

## 2018-08-12 MED ORDER — AZITHROMYCIN 250 MG PO TABS: ORAL_TABLET | ORAL | 0 refills | Status: DC

## 2018-08-12 MED ORDER — METFORMIN HCL 500 MG PO TABS: 500.0000 mg | ORAL_TABLET | Freq: Two times a day (BID) | ORAL | 1 refills | Status: DC

## 2018-08-12 MED ORDER — ALPRAZOLAM 1 MG PO TABS: ORAL_TABLET | ORAL | 2 refills | Status: DC

## 2018-08-12 MED ORDER — ATORVASTATIN CALCIUM 80 MG PO TABS: 80.0000 mg | ORAL_TABLET | Freq: Every day | ORAL | 1 refills | Status: DC

## 2018-08-12 MED ORDER — FLUTICASONE FUROATE-VILANTEROL 100-25 MCG/INH IN AEPB: 1.0000 | INHALATION_SPRAY | Freq: Every day | RESPIRATORY_TRACT | 3 refills | Status: DC

## 2018-08-12 MED ORDER — PRAMIPEXOLE DIHYDROCHLORIDE 0.5 MG PO TABS: 0.5000 mg | ORAL_TABLET | Freq: Two times a day (BID) | ORAL | 1 refills | Status: DC

## 2018-08-12 MED ORDER — ISOSORBIDE MONONITRATE ER 30 MG PO TB24: 15.0000 mg | ORAL_TABLET | Freq: Every day | ORAL | 1 refills | Status: DC

## 2018-08-12 MED ORDER — ESCITALOPRAM OXALATE 20 MG PO TABS: 20.0000 mg | ORAL_TABLET | Freq: Every day | ORAL | 1 refills | Status: DC

## 2018-08-12 MED ORDER — METOPROLOL TARTRATE 25 MG PO TABS: 25.0000 mg | ORAL_TABLET | Freq: Two times a day (BID) | ORAL | 1 refills | Status: DC

## 2018-08-12 MED ORDER — PREDNISONE 10 MG (21) PO TBPK: ORAL_TABLET | ORAL | 0 refills | Status: DC

## 2018-08-12 MED ORDER — BUPROPION HCL ER (XL) 150 MG PO TB24: 150.0000 mg | ORAL_TABLET | Freq: Every day | ORAL | 1 refills | Status: DC

## 2018-08-12 NOTE — Patient Instructions (Signed)

## 2018-08-12 NOTE — Progress Notes (Signed)
Subjective:    Patient ID: [name], female    DOB: 04-12-1956, 63 y.o.   MRN: [id number]   Chief Complaint: medical management of chronic issues  HPI:  1. Essential hypertension  No c/o chest pain, sob or headache. Does not check blood pressure at home. BP Readings from Last 3 Encounters:  04/22/18 (!) 150/79  03/09/18 (!) 182/77  02/11/18 138/77     2. Bilateral carotid artery stenosis  Has not had U/s of carotids.  3. ST elevation myocardial infarction (STEMI), unspecified artery ([place])   4. Chronic obstructive pulmonary disease, unspecified COPD type ([place])  Has not seen cardiology in awhile according to chart.  5. Type 2 diabetes mellitus with other circulatory complication, without long-term current use of insulin (HCC)  Last hgab1c was 5.9%. she has not been checking her blood sugars at home. No symptoms of hypoglycemia  6. Restless leg syndrome  Takes mirapex at night which helps the jerkiness in her legs  7. Morbid obesity ([place])  No recent weigh changes  8. Pure hypercholesterolemia  Does not watch diet and does very little exercise  9. Generalized anxiety disorder  Is on xanax 2 1/2 tablets daly as needed  10. Recurrent major depressive disorder, in full remission ([place])  Is on lexapro daily and is doing well. Depression screen [facility] 2/9 08/12/2018 04/22/2018 02/11/2018  Decreased Interest 2 0 2  Down, Depressed, Hopeless 2 0 2  PHQ - 2 Score 4 0 4  Altered sleeping 3 - 3  Tired, decreased energy 3 - 3  Change in appetite 3 - 2  Feeling bad or failure about yourself  2 - 2  Trouble concentrating 2 - 2  Moving slowly or fidgety/restless 2 - 1  Suicidal thoughts 0 - 1  PHQ-9 Score 19 - 18  Difficult doing work/chores - - -  Some recent data might be hidden       Outpatient Encounter Medications as of 08/12/2018  Medication Sig  . albuterol (PROVENTIL HFA;VENTOLIN HFA) 108 (90 Base) MCG/ACT inhaler Inhale 2 puffs into the lungs every 6 (six) hours as  needed for wheezing or shortness of breath.  . ALPRAZolam (XANAX) 1 MG tablet TAKE 1 TABLET BY MOUTH TWICE DAILY AND 1/2 (ONE-HALF) TABLET AS NEEDED  . aspirin 325 MG tablet Take 325 mg by mouth daily.    [name] atorvastatin (LIPITOR) 80 MG tablet Take 1 tablet (80 mg total) by mouth daily. (Needs to be seen before next refill)  . Cholecalciferol (VITAMIN D3) 5000 units TABS Take 1 tablet by mouth daily.  . cyclobenzaprine (FLEXERIL) 10 MG tablet Take 1 tablet (10 mg total) by mouth 3 (three) times daily as needed for muscle spasms.  [name] escitalopram (LEXAPRO) 20 MG tablet Take 1 tablet (20 mg total) by mouth daily.  . fish oil-omega-3 fatty acids 1000 MG capsule Take 2 g by mouth 2 (two) times daily.   . fluticasone furoate-vilanterol (BREO ELLIPTA) 100-25 MCG/INH AEPB Inhale 1 puff into the lungs daily.  . isosorbide mononitrate (IMDUR) 30 MG 24 hr tablet Take 0.5 tablets (15 mg total) by mouth daily.  [name] lisinopril (PRINIVIL,ZESTRIL) 20 MG tablet TAKE 1 TABLET (20 MG TOTAL) BY MOUTH DAILY.  . metFORMIN (GLUCOPHAGE) 500 MG tablet Take 1 tablet (500 mg total) by mouth 2 (two) times daily with a meal.  . metoprolol tartrate (LOPRESSOR) 25 MG tablet Take 1 tablet (25 mg total) by mouth 2 (two) times daily.  . pramipexole (MIRAPEX) 0.5 MG  tablet Take 1 tablet (0.5 mg total) by mouth 2 (two) times daily.  . predniSONE (DELTASONE) 20 MG tablet Take 3 tabs daily for 1 week, then 2 tabs daily for week 2, then 1 tab daily for week 3.  . vitamin B-12 (CYANOCOBALAMIN) 1000 MCG tablet Take 1,000 mcg by mouth daily.     New complaints: Says her COPD is acting up again. Says she has had increasing SOB  Social history: Lives by herself.   Review of Systems  Constitutional: Negative for chills and fever.  HENT: Positive for postnasal drip.   Respiratory: Positive for cough and shortness of breath.   Gastrointestinal: Negative.   Musculoskeletal: Positive for arthralgias.  Neurological: Positive for  light-headedness. Negative for headaches.  Psychiatric/Behavioral: Negative.   All other systems reviewed and are negative.      Objective:   Physical Exam Vitals signs and nursing note reviewed.  Constitutional:      General: She is not in acute distress.    Appearance: Normal appearance. She is well-developed. She is obese.  HENT:     Head: Normocephalic.     Nose: Nose normal.  Eyes:     Pupils: Pupils are equal, round, and reactive to light.  Neck:     Musculoskeletal: Normal range of motion and neck supple.     Vascular: No carotid bruit or JVD.  Cardiovascular:     Rate and Rhythm: Normal rate and regular rhythm.     Heart sounds: Normal heart sounds.  Pulmonary:     Effort: Pulmonary effort is normal. No respiratory distress.     Breath sounds: Wheezing (exp wheezes in bil lower lobes) present. No rales.  Chest:     Chest wall: No tenderness.  Abdominal:     General: Bowel sounds are normal. There is no distension or abdominal bruit.     Palpations: Abdomen is soft. There is no hepatomegaly, splenomegaly, mass or pulsatile mass.     Tenderness: There is no abdominal tenderness.  Musculoskeletal: Normal range of motion.  Lymphadenopathy:     Cervical: No cervical adenopathy.  Skin:    General: Skin is warm and dry.  Neurological:     Mental Status: She is alert and oriented to person, place, and time.     Deep Tendon Reflexes: Reflexes are normal and symmetric.  Psychiatric:        Behavior: Behavior normal.        Thought Content: Thought content normal.        Judgment: Judgment normal.     BP (!) 156/76   Pulse 64   Temp (!) 96.6 F (35.9 C) (Oral)   Ht _0  (1.6 m)   Wt 209 lb (94.8 kg)   LMP 10/17/2005   SpO2 94%   BMI 37.02 kg/m   hgba1c 6.1%     Assessment & Plan:  ADITRI LOUISCHARLES comes in today with chief complaint of Medical Management of Chronic Issues   Diagnosis and orders addressed:  1. Essential hypertension Low sodium diet -  CMP14+EGFR - lisinopril (PRINIVIL,ZESTRIL) 40 MG tablet; Take 1 tablet (40 mg total) by mouth daily.  Dispense: 90 tablet; Refill: 3  2. Bilateral carotid artery stenosis Will order carotid U/S  3. ST elevation myocardial infarction (STEMI), unspecified artery ([place])  4. Chronic obstructive pulmonary disease, unspecified COPD type (HCC) - fluticasone furoate-vilanterol (BREO ELLIPTA) 100-25 MCG/INH AEPB; Inhale 1 puff into the lungs daily.  Dispense: 60 each; Refill: 3  5. Type 2  diabetes mellitus with other circulatory complication, without long-term current use of insulin (HCC) continue to watch carbs in diet - Bayer DCA Hb A1c Waived - Microalbumin / creatinine urine ratio - metFORMIN (GLUCOPHAGE) 500 MG tablet; Take 1 tablet (500 mg total) by mouth 2 (two) times daily with a meal.  Dispense: 180 tablet; Refill: 1  6. Restless leg syndrome Keep legs warm - pramipexole (MIRAPEX) 0.5 MG tablet; Take 1 tablet (0.5 mg total) by mouth 2 (two) times daily.  Dispense: 180 tablet; Refill: 1  7. Morbid obesity ([place]) Discussed diet and exercise for person with BMI >25 Will recheck weight in 3-6 months  8. Pure hypercholesterolemia Low fat diet - Lipid panel  9. Generalized anxiety disorder Stress management - ALPRAZolam (XANAX) 1 MG tablet; TAKE 1 TABLET BY MOUTH TWICE DAILY AND 1/2 (ONE-HALF) TABLET AS NEEDED  Dispense: 75 tablet; Refill: 2 - escitalopram (LEXAPRO) 20 MG tablet; Take 1 tablet (20 mg total) by mouth daily.  Dispense: 90 tablet; Refill: 1  10. Recurrent major depressive disorder, in full remission ([place]) Added wellburin to lexapro - buPROPion (WELLBUTRIN XL) 150 MG 24 hr tablet; Take 1 tablet (150 mg total) by mouth daily.  Dispense: 90 tablet; Refill: 1  11. Atherosclerosis of native coronary artery without angina pectoris, unspecified whether native or transplanted heart - isosorbide mononitrate (IMDUR) 30 MG 24 hr tablet; Take 0.5 tablets (15 mg total) by mouth daily.   Dispense: 90 tablet; Refill: 1  12. Hyperlipidemia, unspecified hyperlipidemia type Low fat diet - atorvastatin (LIPITOR) 80 MG tablet; Take 1 tablet (80 mg total) by mouth daily. (Needs to be seen before next refill)  Dispense: 90 tablet; Refill: 1 - metoprolol tartrate (LOPRESSOR) 25 MG tablet; Take 1 tablet (25 mg total) by mouth 2 (two) times daily.  Dispense: 180 tablet; Refill: 1  13. Acute exacerbation of COPD with asthma ([place]) Force fluids Rest Watch blood sugars because ethey will go up with prednisone- patient has had in past and doen well. - azithromycin (ZITHROMAX Z-PAK) 250 MG tablet; As directed  Dispense: 6 tablet; Refill: 0 - predniSONE (STERAPRED UNI-PAK 21 TAB) 10 MG (21) TBPK tablet; As directed x 6 days  Dispense: 21 tablet; Refill: 0   Labs pending Health Maintenance reviewed Diet and exercise encouraged  Follow up plan: 3 months   [name], FNP

## 2018-08-13 LAB — CMP14+EGFR
ALT: 15 IU/L (ref 0–32)
AST: 12 IU/L (ref 0–40)
Albumin/Globulin Ratio: 2 (ref 1.2–2.2)
Albumin: 4.3 g/dL (ref 3.6–4.8)
Alkaline Phosphatase: 80 IU/L (ref 39–117)
BUN/Creatinine Ratio: 16 (ref 12–28)
BUN: 14 mg/dL (ref 8–27)
Bilirubin Total: 0.2 mg/dL (ref 0.0–1.2)
CO2: 23 mmol/L (ref 20–29)
CREATININE: 0.9 mg/dL (ref 0.57–1.00)
Calcium: 10 mg/dL (ref 8.7–10.3)
Chloride: 103 mmol/L (ref 96–106)
GFR calc Af Amer: 80 mL/min/{1.73_m2} (ref >59)
GFR calc non Af Amer: 69 mL/min/{1.73_m2} (ref >59)
Globulin, Total: 2.1 g/dL (ref 1.5–4.5)
Glucose: 97 mg/dL (ref 65–99)
Potassium: 4.5 mmol/L (ref 3.5–5.2)
Sodium: 144 mmol/L (ref 134–144)
Total Protein: 6.4 g/dL (ref 6.0–8.5)

## 2018-08-13 LAB — LIPID PANEL
Chol/HDL Ratio: 7.8 ratio — ABNORMAL HIGH (ref 0.0–4.4)
Cholesterol, Total: 274 mg/dL — ABNORMAL HIGH (ref 100–199)
HDL: 35 mg/dL — ABNORMAL LOW (ref >39)
LDL Calculated: 185 mg/dL — ABNORMAL HIGH (ref 0–99)
TRIGLYCERIDES: 272 mg/dL — AB (ref 0–149)
VLDL Cholesterol Cal: 54 mg/dL — ABNORMAL HIGH (ref 5–40)

## 2018-08-25 ENCOUNTER — Ambulatory Visit (HOSPITAL_COMMUNITY)
Admission: RE | Admit: 2018-08-25 | Discharge: 2018-08-25 | Disposition: A | Discharge status: Home / Self Care | Payer: Medicare HMO | Source: Ambulatory Visit | Attending: Nurse Practitioner | Admitting: Nurse Practitioner

## 2018-08-25 DIAGNOSIS — I6523 Occlusion and stenosis of bilateral carotid arteries: Secondary | ICD-10-CM | POA: Diagnosis not present

## 2018-08-26 ENCOUNTER — Telehealth: Payer: Self-pay | Admitting: Nurse Practitioner

## 2018-08-26 NOTE — Telephone Encounter (Signed)
Patient notified of US results

## 2018-09-11 ENCOUNTER — Other Ambulatory Visit: Payer: Self-pay | Admitting: Nurse Practitioner

## 2018-09-11 DIAGNOSIS — F411 Generalized anxiety disorder: Secondary | ICD-10-CM

## 2018-10-06 ENCOUNTER — Telehealth: Payer: Self-pay | Admitting: Nurse Practitioner

## 2018-10-06 NOTE — Telephone Encounter (Signed)
Pt wanted to make sure that her Lisinopril was increased to 40 by MMM and not a mistake. January visit does state increase to 40 pt aware

## 2018-10-13 ENCOUNTER — Ambulatory Visit: Payer: Medicare HMO | Admitting: Nurse Practitioner

## 2018-11-11 ENCOUNTER — Other Ambulatory Visit: Payer: Self-pay

## 2018-11-11 ENCOUNTER — Ambulatory Visit (INDEPENDENT_AMBULATORY_CARE_PROVIDER_SITE_OTHER): Payer: Medicare HMO | Admitting: Nurse Practitioner

## 2018-11-11 ENCOUNTER — Encounter: Payer: Self-pay | Admitting: Nurse Practitioner

## 2018-11-11 DIAGNOSIS — E559 Vitamin D deficiency, unspecified: Secondary | ICD-10-CM

## 2018-11-11 DIAGNOSIS — I2510 Atherosclerotic heart disease of native coronary artery without angina pectoris: Secondary | ICD-10-CM

## 2018-11-11 DIAGNOSIS — J449 Chronic obstructive pulmonary disease, unspecified: Secondary | ICD-10-CM

## 2018-11-11 DIAGNOSIS — I6523 Occlusion and stenosis of bilateral carotid arteries: Secondary | ICD-10-CM

## 2018-11-11 DIAGNOSIS — E7800 Pure hypercholesterolemia, unspecified: Secondary | ICD-10-CM | POA: Diagnosis not present

## 2018-11-11 DIAGNOSIS — E1159 Type 2 diabetes mellitus with other circulatory complications: Secondary | ICD-10-CM

## 2018-11-11 DIAGNOSIS — E785 Hyperlipidemia, unspecified: Secondary | ICD-10-CM

## 2018-11-11 DIAGNOSIS — F411 Generalized anxiety disorder: Secondary | ICD-10-CM | POA: Diagnosis not present

## 2018-11-11 DIAGNOSIS — G2581 Restless legs syndrome: Secondary | ICD-10-CM

## 2018-11-11 DIAGNOSIS — F3342 Major depressive disorder, recurrent, in full remission: Secondary | ICD-10-CM

## 2018-11-11 DIAGNOSIS — I2583 Coronary atherosclerosis due to lipid rich plaque: Secondary | ICD-10-CM

## 2018-11-11 DIAGNOSIS — I10 Essential (primary) hypertension: Secondary | ICD-10-CM | POA: Diagnosis not present

## 2018-11-11 MED ORDER — ATORVASTATIN CALCIUM 80 MG PO TABS: 80.0000 mg | ORAL_TABLET | Freq: Every day | ORAL | 1 refills | Status: DC

## 2018-11-11 MED ORDER — METOPROLOL TARTRATE 25 MG PO TABS: 25.0000 mg | ORAL_TABLET | Freq: Two times a day (BID) | ORAL | 1 refills | Status: DC

## 2018-11-11 MED ORDER — ALPRAZOLAM 1 MG PO TABS: ORAL_TABLET | ORAL | 2 refills | Status: DC

## 2018-11-11 MED ORDER — METFORMIN HCL 500 MG PO TABS: 500.0000 mg | ORAL_TABLET | Freq: Two times a day (BID) | ORAL | 1 refills | Status: DC

## 2018-11-11 MED ORDER — ISOSORBIDE MONONITRATE ER 30 MG PO TB24: 15.0000 mg | ORAL_TABLET | Freq: Every day | ORAL | 1 refills | Status: DC

## 2018-11-11 MED ORDER — FLUTICASONE FUROATE-VILANTEROL 100-25 MCG/INH IN AEPB: 1.0000 | INHALATION_SPRAY | Freq: Every day | RESPIRATORY_TRACT | 3 refills | Status: DC

## 2018-11-11 MED ORDER — LISINOPRIL 40 MG PO TABS: 40.0000 mg | ORAL_TABLET | Freq: Every day | ORAL | 3 refills | Status: DC

## 2018-11-11 MED ORDER — PRAMIPEXOLE DIHYDROCHLORIDE 0.5 MG PO TABS: 0.5000 mg | ORAL_TABLET | Freq: Two times a day (BID) | ORAL | 1 refills | Status: DC

## 2018-11-11 MED ORDER — BUPROPION HCL ER (XL) 150 MG PO TB24: 150.0000 mg | ORAL_TABLET | Freq: Every day | ORAL | 1 refills | Status: DC

## 2018-11-11 MED ORDER — ESCITALOPRAM OXALATE 20 MG PO TABS: 20.0000 mg | ORAL_TABLET | Freq: Every day | ORAL | 1 refills | Status: DC

## 2018-11-11 NOTE — Progress Notes (Signed)
Patient ID: [name], female   DOB: [birth year], 63 y.o.   MRN: [id number]    Virtual Visit via telephone Note  I connected with [name] on 11/11/18 at 11:20AM by telephone and verified that I am speaking with the correct person using two identifiers. CHENAE BRAGER is currently located at home and non one is currently with her during visit. The provider, [name], FNP is located in their office at time of visit.  I discussed the limitations, risks, security and privacy concerns of performing an evaluation and management service by telephone and the availability of in person appointments. I also discussed with the patient that there may be a patient responsible charge related to this service. The patient expressed understanding and agreed to proceed.   History and Present Illness:   Chief Complaint: Medical Management of Chronic Issues    HPI:  1. Essential hypertension No c/o chest pain , SOB or headaches. Does not check blood pressure at home  2. Coronary artery disease due to lipid rich plaque Has not seen cardiology in several years  3. Bilateral carotid artery stenosis denies any near syncopal episodes no dizziness  4. Chronic obstructive pulmonary disease, unspecified COPD type ([place]) Does not see pulmonology. Is currently on BREO and is doing well.  5. Type 2 diabetes mellitus with other circulatory complication, without long-term current use of insulin (HCC) Last HGBA1c was 6.1%. she does not check blood sugars very often. She does not check her blood sugars at home. Denies feeling any symptoms of hypogycemia.  6. Pure hypercholesterolemia Does not watch diet and does no exercise  7. Generalized anxiety disorder Is on xanax and takes 2-3 a day. Says it keeps her from worrying so much  8. Recurrent major depressive disorder, in full remission ([place]) Has been on lexapro for several years. Says she is doing well.  9. Restless leg syndrome Is on  mirapex nightly and works well to keeps legs calm at night  10. Vitamin D deficiency Takes daily vitamin d supplement  11. Morbid obesity ([place]) No recent weight chnages    Outpatient Encounter Medications as of 11/11/2018  Medication Sig  . albuterol (PROVENTIL HFA;VENTOLIN HFA) 108 (90 Base) MCG/ACT inhaler Inhale 2 puffs into the lungs every 6 (six) hours as needed for wheezing or shortness of breath.  . ALPRAZolam (XANAX) 1 MG tablet TAKE 1 TABLET BY MOUTH TWICE DAILY AND 1/2 (ONE-HALF) TABLET AS NEEDED  . aspirin 325 MG tablet Take 325 mg by mouth daily.    [name] atorvastatin (LIPITOR) 80 MG tablet Take 1 tablet (80 mg total) by mouth daily. (Needs to be seen before next refill)  . azithromycin (ZITHROMAX Z-PAK) 250 MG tablet As directed  . buPROPion (WELLBUTRIN XL) 150 MG 24 hr tablet Take 1 tablet (150 mg total) by mouth daily.  . Cholecalciferol (VITAMIN D3) 5000 units TABS Take 1 tablet by mouth daily.  . cyclobenzaprine (FLEXERIL) 10 MG tablet Take 1 tablet (10 mg total) by mouth 3 (three) times daily as needed for muscle spasms.  [name] escitalopram (LEXAPRO) 20 MG tablet Take 1 tablet (20 mg total) by mouth daily.  . fish oil-omega-3 fatty acids 1000 MG capsule Take 2 g by mouth 2 (two) times daily.   . fluticasone furoate-vilanterol (BREO ELLIPTA) 100-25 MCG/INH AEPB Inhale 1 puff into the lungs daily.  . isosorbide mononitrate (IMDUR) 30 MG 24 hr tablet Take 0.5 tablets (15 mg total) by mouth daily.  [name] lisinopril (PRINIVIL,ZESTRIL) 40  MG tablet Take 1 tablet (40 mg total) by mouth daily.  . metFORMIN (GLUCOPHAGE) 500 MG tablet Take 1 tablet (500 mg total) by mouth 2 (two) times daily with a meal.  . metoprolol tartrate (LOPRESSOR) 25 MG tablet Take 1 tablet (25 mg total) by mouth 2 (two) times daily.  . pramipexole (MIRAPEX) 0.5 MG tablet Take 1 tablet (0.5 mg total) by mouth 2 (two) times daily.  . predniSONE (STERAPRED UNI-PAK 21 TAB) 10 MG (21) TBPK tablet As directed x 6 days  .  vitamin B-12 (CYANOCOBALAMIN) 1000 MCG tablet Take 1,000 mcg by mouth daily.      New complaints: None today  Social history: Lives alone- family checks on her daily       Review of Systems  Constitutional: Negative for diaphoresis and weight loss.  Eyes: Negative for blurred vision, double vision and pain.  Respiratory: Negative for shortness of breath (no more then usual).   Cardiovascular: Negative for chest pain, palpitations, orthopnea and leg swelling.  Gastrointestinal: Negative for abdominal pain.  Genitourinary: Negative.   Skin: Negative for rash.  Neurological: Negative for dizziness, sensory change, loss of consciousness, weakness and headaches.  Endo/Heme/Allergies: Negative for polydipsia. Does not bruise/bleed easily.  Psychiatric/Behavioral: Negative for memory loss. The patient does not have insomnia.   All other systems reviewed and are negative.    Observations/Objective: Alert and oriented- answers all questions appropriately No distress noted  Assessment and Plan: ATAYA MURDY comes in today with chief complaint of Medical Management of Chronic Issues   Diagnosis and orders addressed:  1. Essential hypertension Low sodium diet - lisinopril (ZESTRIL) 40 MG tablet; Take 1 tablet (40 mg total) by mouth daily.  Dispense: 90 tablet; Refill: 3  2. Coronary artery disease due to lipid rich plaque Does not want to see cardiology  3. Bilateral carotid artery stenosis  4. Chronic obstructive pulmonary disease, unspecified COPD type ([place]) Avoid staying outside - fluticasone furoate-vilanterol (BREO ELLIPTA) 100-25 MCG/INH AEPB; Inhale 1 puff into the lungs daily.  Dispense: 60 each; Refill: 3  5. Type 2 diabetes mellitus with other circulatory complication, without long-term current use of insulin (HCC) Low carb diet - metFORMIN (GLUCOPHAGE) 500 MG tablet; Take 1 tablet (500 mg total) by mouth 2 (two) times daily with a meal.  Dispense: 180 tablet;  Refill: 1  6. Pure hypercholesterolemia Low fat diet  7. Generalized anxiety disorder Stress management - escitalopram (LEXAPRO) 20 MG tablet; Take 1 tablet (20 mg total) by mouth daily.  Dispense: 90 tablet; Refill: 1 - ALPRAZolam (XANAX) 1 MG tablet; TAKE 1 TABLET BY MOUTH TWICE DAILY AND 1/2 (ONE-HALF) TABLET AS NEEDED  Dispense: 75 tablet; Refill: 2  8. Recurrent major depressive disorder, in full remission ([place]) - buPROPion (WELLBUTRIN XL) 150 MG 24 hr tablet; Take 1 tablet (150 mg total) by mouth daily.  Dispense: 90 tablet; Refill: 1  9. Restless leg syndrome Keep legs warm at night - pramipexole (MIRAPEX) 0.5 MG tablet; Take 1 tablet (0.5 mg total) by mouth 2 (two) times daily.  Dispense: 180 tablet; Refill: 1  10. Vitamin D deficiency  11. Morbid obesity ([place]) Discussed diet and exercise for person with BMI >25 Will recheck weight in 3-6 months  12. Atherosclerosis of native coronary artery without angina pectoris, unspecified whether native or transplanted heart - isosorbide mononitrate (IMDUR) 30 MG 24 hr tablet; Take 0.5 tablets (15 mg total) by mouth daily.  Dispense: 90 tablet; Refill: 1  13. Hyperlipidemia, unspecified hyperlipidemia  type  atorvastatin (LIPITOR) 80 MG tablet; Take 1 tablet (80 mg total) by mouth daily. (Needs to be seen before next refill)  Dispense: 90 tablet; Refill: 1 - metoprolol tartrate (LOPRESSOR) 25 MG tablet; Take 1 tablet (25 mg total) by mouth 2 (two) times daily.  Dispense: 180 tablet; Refill: 1   Previous labs results reviewed Health Maintenance reviewed Diet and exercise encouraged  Follow up plan: 3 months      I discussed the assessment and treatment plan with the patient. The patient was provided an opportunity to ask questions and all were answered. The patient agreed with the plan and demonstrated an understanding of the instructions.   The patient was advised to call back or seek an in-person evaluation if the symptoms  worsen or if the condition fails to improve as anticipated.  The above assessment and management plan was discussed with the patient. The patient verbalized understanding of and has agreed to the management plan. Patient is aware to call the clinic if symptoms persist or worsen. Patient is aware when to return to the clinic for a follow-up visit. Patient educated on when it is appropriate to go to the emergency department.    I provided 15 minutes of non-face-to-face time during this encounter.    [name], FNP

## 2018-11-14 ENCOUNTER — Telehealth: Payer: Self-pay | Admitting: Nurse Practitioner

## 2018-11-14 DIAGNOSIS — E785 Hyperlipidemia, unspecified: Secondary | ICD-10-CM

## 2018-11-14 DIAGNOSIS — J449 Chronic obstructive pulmonary disease, unspecified: Secondary | ICD-10-CM

## 2018-11-14 DIAGNOSIS — I10 Essential (primary) hypertension: Secondary | ICD-10-CM

## 2018-11-14 DIAGNOSIS — I2510 Atherosclerotic heart disease of native coronary artery without angina pectoris: Secondary | ICD-10-CM

## 2018-11-14 DIAGNOSIS — F411 Generalized anxiety disorder: Secondary | ICD-10-CM

## 2018-11-14 DIAGNOSIS — E1159 Type 2 diabetes mellitus with other circulatory complications: Secondary | ICD-10-CM

## 2018-11-14 DIAGNOSIS — F3342 Major depressive disorder, recurrent, in full remission: Secondary | ICD-10-CM

## 2018-11-14 DIAGNOSIS — G2581 Restless legs syndrome: Secondary | ICD-10-CM

## 2018-11-14 MED ORDER — METOPROLOL TARTRATE 25 MG PO TABS: 25.0000 mg | ORAL_TABLET | Freq: Two times a day (BID) | ORAL | 1 refills | Status: DC

## 2018-11-14 MED ORDER — ESCITALOPRAM OXALATE 20 MG PO TABS: 20.0000 mg | ORAL_TABLET | Freq: Every day | ORAL | 1 refills | Status: DC

## 2018-11-14 MED ORDER — ISOSORBIDE MONONITRATE ER 30 MG PO TB24: 15.0000 mg | ORAL_TABLET | Freq: Every day | ORAL | 1 refills | Status: DC

## 2018-11-14 MED ORDER — ATORVASTATIN CALCIUM 80 MG PO TABS: 80.0000 mg | ORAL_TABLET | Freq: Every day | ORAL | 1 refills | Status: DC

## 2018-11-14 MED ORDER — LISINOPRIL 40 MG PO TABS: 40.0000 mg | ORAL_TABLET | Freq: Every day | ORAL | 3 refills | Status: DC

## 2018-11-14 MED ORDER — PRAMIPEXOLE DIHYDROCHLORIDE 0.5 MG PO TABS: 0.5000 mg | ORAL_TABLET | Freq: Two times a day (BID) | ORAL | 1 refills | Status: DC

## 2018-11-14 MED ORDER — METFORMIN HCL 500 MG PO TABS: 500.0000 mg | ORAL_TABLET | Freq: Two times a day (BID) | ORAL | 1 refills | Status: DC

## 2018-11-14 MED ORDER — BUPROPION HCL ER (XL) 150 MG PO TB24: 150.0000 mg | ORAL_TABLET | Freq: Every day | ORAL | 1 refills | Status: DC

## 2018-11-14 MED ORDER — FLUTICASONE FUROATE-VILANTEROL 100-25 MCG/INH IN AEPB: 1.0000 | INHALATION_SPRAY | Freq: Every day | RESPIRATORY_TRACT | 0 refills | Status: DC

## 2018-11-14 NOTE — Telephone Encounter (Signed)
Pt aware medications sent to mail order

## 2018-11-15 ENCOUNTER — Telehealth: Payer: Self-pay | Admitting: Nurse Practitioner

## 2018-11-16 NOTE — Telephone Encounter (Signed)
Patient wanted [place] to know that she was going to try tumeric for joint pain. Advised to let us know if it works for her.

## 2018-11-21 ENCOUNTER — Other Ambulatory Visit: Payer: Self-pay

## 2019-02-10 ENCOUNTER — Other Ambulatory Visit: Payer: Self-pay | Admitting: Nurse Practitioner

## 2019-02-10 DIAGNOSIS — F411 Generalized anxiety disorder: Secondary | ICD-10-CM

## 2019-02-11 ENCOUNTER — Other Ambulatory Visit: Payer: Self-pay | Admitting: Nurse Practitioner

## 2019-02-11 DIAGNOSIS — F411 Generalized anxiety disorder: Secondary | ICD-10-CM

## 2019-02-13 ENCOUNTER — Encounter: Payer: Medicare HMO | Admitting: *Deleted

## 2019-02-13 ENCOUNTER — Encounter: Payer: Self-pay | Admitting: Nurse Practitioner

## 2019-02-13 ENCOUNTER — Ambulatory Visit (INDEPENDENT_AMBULATORY_CARE_PROVIDER_SITE_OTHER): Payer: Medicare HMO | Admitting: Nurse Practitioner

## 2019-02-13 DIAGNOSIS — I2510 Atherosclerotic heart disease of native coronary artery without angina pectoris: Secondary | ICD-10-CM

## 2019-02-13 DIAGNOSIS — F5101 Primary insomnia: Secondary | ICD-10-CM

## 2019-02-13 DIAGNOSIS — I10 Essential (primary) hypertension: Secondary | ICD-10-CM

## 2019-02-13 DIAGNOSIS — F411 Generalized anxiety disorder: Secondary | ICD-10-CM | POA: Diagnosis not present

## 2019-02-13 DIAGNOSIS — C50112 Malignant neoplasm of central portion of left female breast: Secondary | ICD-10-CM

## 2019-02-13 DIAGNOSIS — G2581 Restless legs syndrome: Secondary | ICD-10-CM | POA: Diagnosis not present

## 2019-02-13 DIAGNOSIS — J449 Chronic obstructive pulmonary disease, unspecified: Secondary | ICD-10-CM | POA: Diagnosis not present

## 2019-02-13 DIAGNOSIS — E7800 Pure hypercholesterolemia, unspecified: Secondary | ICD-10-CM | POA: Diagnosis not present

## 2019-02-13 DIAGNOSIS — F3342 Major depressive disorder, recurrent, in full remission: Secondary | ICD-10-CM | POA: Diagnosis not present

## 2019-02-13 DIAGNOSIS — I2583 Coronary atherosclerosis due to lipid rich plaque: Secondary | ICD-10-CM

## 2019-02-13 DIAGNOSIS — E785 Hyperlipidemia, unspecified: Secondary | ICD-10-CM

## 2019-02-13 DIAGNOSIS — E1159 Type 2 diabetes mellitus with other circulatory complications: Secondary | ICD-10-CM

## 2019-02-13 MED ORDER — PRAMIPEXOLE DIHYDROCHLORIDE 0.5 MG PO TABS: 0.5000 mg | ORAL_TABLET | Freq: Two times a day (BID) | ORAL | 1 refills | Status: DC

## 2019-02-13 MED ORDER — ALPRAZOLAM 1 MG PO TABS: ORAL_TABLET | ORAL | 2 refills | Status: DC

## 2019-02-13 MED ORDER — BREO ELLIPTA 100-25 MCG/INH IN AEPB: 1.0000 | INHALATION_SPRAY | Freq: Every day | RESPIRATORY_TRACT | 1 refills | Status: DC

## 2019-02-13 MED ORDER — ESCITALOPRAM OXALATE 20 MG PO TABS: 20.0000 mg | ORAL_TABLET | Freq: Every day | ORAL | 1 refills | Status: DC

## 2019-02-13 MED ORDER — METOPROLOL TARTRATE 25 MG PO TABS: 25.0000 mg | ORAL_TABLET | Freq: Two times a day (BID) | ORAL | 1 refills | Status: DC

## 2019-02-13 MED ORDER — ZOLPIDEM TARTRATE 5 MG PO TABS: 5.0000 mg | ORAL_TABLET | Freq: Every evening | ORAL | 2 refills | Status: DC | PRN

## 2019-02-13 MED ORDER — LISINOPRIL 40 MG PO TABS: 40.0000 mg | ORAL_TABLET | Freq: Every day | ORAL | 3 refills | Status: DC

## 2019-02-13 MED ORDER — METFORMIN HCL 500 MG PO TABS: 500.0000 mg | ORAL_TABLET | Freq: Two times a day (BID) | ORAL | 1 refills | Status: DC

## 2019-02-13 MED ORDER — ISOSORBIDE MONONITRATE ER 30 MG PO TB24: 15.0000 mg | ORAL_TABLET | Freq: Every day | ORAL | 1 refills | Status: DC

## 2019-02-13 MED ORDER — ATORVASTATIN CALCIUM 80 MG PO TABS: 80.0000 mg | ORAL_TABLET | Freq: Every day | ORAL | 1 refills | Status: DC

## 2019-02-13 NOTE — Progress Notes (Signed)
Virtual Visit via telephone Note  I connected with [name] on 02/13/19 at 2:00 by telephone and verified that I am speaking with the correct person using two identifiers. [name] is currently located at home and no one is currently with her during visit. The provider, [name], FNP is located in their office at time of visit.  I discussed the limitations, risks, security and privacy concerns of performing an evaluation and management service by telephone and the availability of in person appointments. I also discussed with the patient that there may be a patient responsible charge related to this service. The patient expressed understanding and agreed to proceed.   History and Present Illness:   Chief Complaint: Medical Management of Chronic Issues    HPI:  1. Essential hypertension No c/o chest pain, sob or headache. Does not check blood pressure at home. BP Readings from Last 3 Encounters:  08/12/18 (!) 156/76  04/22/18 (!) 150/79  03/09/18 (!) 182/77     2. Pure hypercholesterolemia Does not watch diet and does very little exercise. She does take lipitor daily.  3. Coronary artery disease due to lipid rich plaque Last saw cardiology in over a year.  4. Type 2 diabetes mellitus with other circulatory complication, without long-term current use of insulin (HCC) Last hgab1c was 6.1. her blood sugars fasting remain below 130. She denies any symptoms of low blood sugars.  5. Chronic obstructive pulmonary disease, unspecified COPD type ([place]) Uses BREO inhaler dal and says she is dong well . The heat really affects her breathing.  6. Generalized anxiety disorder Is on xanax 2 1/2 tablets daily. We ar etrying to wean her down to no more then 2 a day.  7. Recurrent major depressive disorder, in full remission [facility]) She is on lexapro daily and is doing well. We tried adding wellbutrin and that made her so sick she could not stand taking it. Depression  screen [facility] 2/9 02/13/2019 11/11/2018 08/12/2018  Decreased Interest 2 1 2   Down, Depressed, Hopeless 2 1 2   PHQ - 2 Score 4 2 4   Altered sleeping 3 - 3  Tired, decreased energy 1 - 3  Change in appetite 0 - 3  Feeling bad or failure about yourself  0 - 2  Trouble concentrating 1 - 2  Moving slowly or fidgety/restless 0 - 2  Suicidal thoughts 0 - 0  PHQ-9 Score 9 - 19  Difficult doing work/chores Somewhat difficult - -  Some recent data might be hidden     8. Restless leg syndrome Takes mirapex daily and is doing well.  9. Malignant neoplasm of central portion of left female breast, unspecified estrogen receptor status ([place]) This was in 2013 and she is has been doing well since she completed her treatments  10. Morbid obesity ([place]) No recent weight changes.    Outpatient Encounter Medications as of 02/13/2019  Medication Sig  . albuterol (PROVENTIL HFA;VENTOLIN HFA) 108 (90 Base) MCG/ACT inhaler Inhale 2 puffs into the lungs every 6 (six) hours as needed for wheezing or shortness of breath.  . ALPRAZolam (XANAX) 1 MG tablet TAKE 1 TABLET BY MOUTH TWICE DAILY AND 1/2 (ONE-HALF) TABLET AS NEEDED  . aspirin 325 MG tablet Take 325 mg by mouth daily.    [name] atorvastatin (LIPITOR) 80 MG tablet Take 1 tablet (80 mg total) by mouth daily. (Needs to be seen before next refill)  . Cholecalciferol (VITAMIN D3) 5000 units TABS Take 1 tablet by mouth  daily.  . cyclobenzaprine (FLEXERIL) 10 MG tablet Take 1 tablet (10 mg total) by mouth 3 (three) times daily as needed for muscle spasms.  [name] escitalopram (LEXAPRO) 20 MG tablet Take 1 tablet (20 mg total) by mouth daily.  . fish oil-omega-3 fatty acids 1000 MG capsule Take 2 g by mouth 2 (two) times daily.   . fluticasone furoate-vilanterol (BREO ELLIPTA) 100-25 MCG/INH AEPB Inhale 1 puff into the lungs daily.  . isosorbide mononitrate (IMDUR) 30 MG 24 hr tablet Take 0.5 tablets (15 mg total) by mouth daily.  [name] lisinopril (ZESTRIL) 40 MG tablet Take  1 tablet (40 mg total) by mouth daily.  . metFORMIN (GLUCOPHAGE) 500 MG tablet Take 1 tablet (500 mg total) by mouth 2 (two) times daily with a meal.  . metoprolol tartrate (LOPRESSOR) 25 MG tablet Take 1 tablet (25 mg total) by mouth 2 (two) times daily.  . pramipexole (MIRAPEX) 0.5 MG tablet Take 1 tablet (0.5 mg total) by mouth 2 (two) times daily.  . vitamin B-12 (CYANOCOBALAMIN) 1000 MCG tablet Take 1,000 mcg by mouth daily.     Past Surgical History:  Procedure Laterality Date  . AXILLARY LYMPH NODE DISSECTION  07/31/2011   Procedure: AXILLARY LYMPH NODE DISSECTION;  Surgeon: [name], MD;  Location: [facility];  Service: General;  Laterality: Left;  left axillary sentinal node biopsy  . BREAST SURGERY    . CARDIAC CATHETERIZATION  11/02/2006; 06/03/2011  . CESAREAN SECTION  1987  . CORONARY STENT PLACEMENT  11/02/2006  . ELBOW SURGERY     right  . MASTECTOMY PARTIAL / LUMPECTOMY  06/30/2011   left    Family History  Problem Relation Age of Onset  . Heart disease Mother   . Heart disease Father   . Cancer Maternal Aunt        pt unaware of what kind  . Cancer Maternal Grandmother        ovarian  . Heart attack Sister   . Bipolar disorder Sister     New complaints: She is having a lot of trouble sleeping. Only sleep s about 2 hours at a time.   Social history: Lives alone and has lots of problems with family.  Controlled substance contract: she will sign the next time she comes in .    Review of Systems  Constitutional: Positive for malaise/fatigue. Negative for diaphoresis and weight loss.  Eyes: Negative for blurred vision, double vision and pain.  Respiratory: Negative.  Negative for shortness of breath.   Cardiovascular: Negative.  Negative for chest pain, palpitations, orthopnea and leg swelling.  Gastrointestinal: Negative for abdominal pain.  Musculoskeletal: Positive for myalgias (normal for her).  Skin: Negative for rash.  Neurological:  Negative for dizziness, sensory change, loss of consciousness, weakness and headaches (normal for her).  Endo/Heme/Allergies: Negative for polydipsia. Does not bruise/bleed easily.  Psychiatric/Behavioral: Positive for depression. Negative for memory loss. The patient has insomnia.   All other systems reviewed and are negative.    Observations/Objective: Alert and oriented- answers all questions appropriately No distress  Assessment and Plan: ELFRIEDA ESPINO comes in today with chief complaint of Medical Management of Chronic Issues   Diagnosis and orders addressed:  1. Essential hypertension *low sodium diet - lisinopril (ZESTRIL) 40 MG tablet; Take 1 tablet (40 mg total) by mouth daily.  Dispense: 90 tablet; Refill: 3  2. Pure hypercholesterolemia Low fat diet - atorvastatin (LIPITOR) 80 MG tablet; Take 1 tablet (80 mg total) by  mouth daily. (Needs to be seen before next refill)  Dispense: 90 tablet; Refill: 1  3. Coronary artery disease due to lipid rich plaque Needs to follow up with cardiology - metoprolol tartrate (LOPRESSOR) 25 MG tablet; Take 1 tablet (25 mg total) by mouth 2 (two) times daily.  Dispense: 180 tablet; Refill: 1  4. Type 2 diabetes mellitus with other circulatory complication, without long-term current use of insulin (HCC) Continue to watch carbs in diet - metFORMIN (GLUCOPHAGE) 500 MG tablet; Take 1 tablet (500 mg total) by mouth 2 (two) times daily with a meal.  Dispense: 180 tablet; Refill: 1  5. Chronic obstructive pulmonary disease, unspecified COPD type ([place]) Avoid the heat'no smoking - fluticasone furoate-vilanterol (BREO ELLIPTA) 100-25 MCG/INH AEPB; Inhale 1 puff into the lungs daily.  Dispense: 180 each; Refill: 1  6. Generalized anxiety disorder Stress management - escitalopram (LEXAPRO) 20 MG tablet; Take 1 tablet (20 mg total) by mouth daily.  Dispense: 90 tablet; Refill: 1 - ALPRAZolam (XANAX) 1 MG tablet; TAKE 1 TABLET BY MOUTH TWICE DAILY  AND 1/2 (ONE-HALF) TABLET AS NEEDED  Dispense: 75 tablet; Refill: 2  7. Recurrent major depressive disorder, in full remission ([place]) continue lexapro as rx- will see if sleep better will help with depression  8. Restless leg syndrome Keep legs warm at night - pramipexole (MIRAPEX) 0.5 MG tablet; Take 1 tablet (0.5 mg total) by mouth 2 (two) times daily.  Dispense: 180 tablet; Refill: 1  9. Malignant neoplasm of central portion of left female breast, unspecified estrogen receptor status ([place]) Has done well since all treatments complete. She has been released form oncology  10. Morbid obesity ([place]) Discussed diet and exercise for person with BMI >25 Will recheck weight in 3-6 months  11. Atherosclerosis of native coronary artery without angina pectoris, unspecified whether native or transplanted heart - isosorbide mononitrate (IMDUR) 30 MG 24 hr tablet; Take 0.5 tablets (15 mg total) by mouth daily.  Dispense: 90 tablet; Refill: 1  12. Primary insomnia Will try low dose ambien to see if will help her sleep. - zolpidem (AMBIEN) 5 MG tablet; Take 1 tablet (5 mg total) by mouth at bedtime as needed for sleep.  Dispense: 30 tablet; Refill: 2   Previous lab results reviewed Health Maintenance reviewed Diet and exercise encouraged  Follow up plan: 3 months      I discussed the assessment and treatment plan with the patient. The patient was provided an opportunity to ask questions and all were answered. The patient agreed with the plan and demonstrated an understanding of the instructions.   The patient was advised to call back or seek an in-person evaluation if the symptoms worsen or if the condition fails to improve as anticipated.  The above assessment and management plan was discussed with the patient. The patient verbalized understanding of and has agreed to the management plan. Patient is aware to call the clinic if symptoms persist or worsen. Patient is aware when to return to the  clinic for a follow-up visit. Patient educated on when it is appropriate to go to the emergency department.   Time call ended:  2:15  I provided 15 minutes of non-face-to-face time during this encounter.    [name], FNP

## 2019-02-15 ENCOUNTER — Telehealth: Payer: Self-pay | Admitting: Nurse Practitioner

## 2019-02-15 NOTE — Chronic Care Management (AMB) (Signed)
Chronic Care Management   Note  02/15/2019 Name: [name] MRN: [id number] DOB: July 16, 1956  [name] is a 63 y.o. year old female who is a primary care patient of [name], [place]. I reached out to [name] by phone today in response to a referral sent by Ms. Tonye Pearson Gulas's health plan.    [name] was given information about Chronic Care Management services today including:  1. CCM service includes personalized support from designated clinical staff supervised by her physician, including individualized plan of care and coordination with other care providers 2. 24/7 contact phone numbers for assistance for urgent and routine care needs. 3. Service will only be billed when office clinical staff spend 20 minutes or more in a month to coordinate care. 4. Only one practitioner may furnish and bill the service in a calendar month. 5. The patient may stop CCM services at any time (effective at the end of the month) by phone call to the office staff. 6. The patient will be responsible for cost sharing (co-pay) of up to 20% of the service fee (after annual deductible is met).  Patient agreed to services and verbal consent obtained.   Follow up plan: Telephone appointment with CCM team member scheduled for: 02/23/2019  [place]  ??[email or web address]   ??[phone]

## 2019-02-23 ENCOUNTER — Ambulatory Visit: Payer: Medicare HMO | Admitting: *Deleted

## 2019-02-23 DIAGNOSIS — E1159 Type 2 diabetes mellitus with other circulatory complications: Secondary | ICD-10-CM

## 2019-02-23 DIAGNOSIS — F3342 Major depressive disorder, recurrent, in full remission: Secondary | ICD-10-CM

## 2019-02-23 DIAGNOSIS — J449 Chronic obstructive pulmonary disease, unspecified: Secondary | ICD-10-CM

## 2019-03-01 ENCOUNTER — Encounter: Payer: Self-pay | Admitting: *Deleted

## 2019-03-01 NOTE — Chronic Care Management (AMB) (Addendum)
Chronic Care Management   Initial Visit Note  02/23/2019 Name: [name] MRN: [id number] DOB: 03/05/1956  Referred by: [name], FNP Reason for referral : Chronic Care Management (RNCM Initial Visit)   [name] is a 63 y.o. year old female who is a primary care patient of [name], [place]. The CCM team was consulted for assistance with chronic disease management and care coordination needs related to depression, anxiety, insomnia, diabetes, hypertension, CKD, and CAD.   Review of patient status, including review of consultants reports, relevant laboratory and other test results, and collaboration with appropriate care team members and the patient's provider was performed as part of comprehensive patient evaluation and provision of chronic care management services.    SDOH (Social Determinants of Health) screening performed today. See Care Plan Entry related to challenges with: Food Insecurity  Depression   Stress Physical Activity  Subjective: "I'm in a really good mood today but I've had a lot of trouble sleeping. It's hard to get to sleep at night and then I wake up and can't go back to sleep. I don't have any energy or motivation."  Patient states that she was prescribed Ambien 5mg  to help with sleep but she is scared to take it because of the potential side effects. She has tried melatonin in the past without success. She would also like her PCP to consider changing her depression medication.  A friend recommended Pristiq. Today's visit primarily focused on insomnia and depression.   Objective:  Medication List:    albuterol 108 (90 Base) MCG/ACT inhaler Commonly known as: VENTOLIN HFA Inhale 2 puffs into the lungs every 6 (six) hours as needed for wheezing or shortness of breath.   ALPRAZolam 1 MG tablet Commonly known as: XANAX TAKE 1 TABLET BY MOUTH TWICE DAILY AND 1/2 (ONE-HALF) TABLET AS NEEDED   aspirin 325 MG tablet Take 325 mg by mouth  daily.   atorvastatin 80 MG tablet Commonly known as: LIPITOR Take 1 tablet (80 mg total) by mouth daily. (Needs to be seen before next refill)   Breo Ellipta 100-25 MCG/INH Aepb Generic drug: fluticasone furoate-vilanterol Inhale 1 puff into the lungs daily.   cyclobenzaprine 10 MG tablet Commonly known as: FLEXERIL Take 1 tablet (10 mg total) by mouth 3 (three) times daily as needed for muscle spasms.   escitalopram 20 MG tablet Commonly known as: LEXAPRO Take 1 tablet (20 mg total) by mouth daily.   fish oil-omega-3 fatty acids 1000 MG capsule Take 2 g by mouth 2 (two) times daily.   isosorbide mononitrate 30 MG 24 hr tablet Commonly known as: IMDUR Take 0.5 tablets (15 mg total) by mouth daily.   lisinopril 40 MG tablet Commonly known as: ZESTRIL Take 1 tablet (40 mg total) by mouth daily.   metFORMIN 500 MG tablet Commonly known as: GLUCOPHAGE Take 1 tablet (500 mg total) by mouth 2 (two) times daily with a meal.   metoprolol tartrate 25 MG tablet Commonly known as: LOPRESSOR Take 1 tablet (25 mg total) by mouth 2 (two) times daily.   pramipexole 0.5 MG tablet Commonly known as: MIRAPEX Take 1 tablet (0.5 mg total) by mouth 2 (two) times daily.   vitamin B-12 1000 MCG tablet Commonly known as: CYANOCOBALAMIN Take 1,000 mcg by mouth daily.   Vitamin D3 125 MCG (5000 UT) Tabs Take 1 tablet by mouth daily.  zolpidem 5 MG tablet Commonly known as: AMBIEN Take 1 tablet (5 mg total) by mouth at bedtime as  needed for sleep. NOT TAKING BECAUSE CONCERNED ABOUT SIDE EFFECTS         Depression screen [facility] 2/9 02/13/2019 11/11/2018  Decreased Interest 2 1  Down, Depressed, Hopeless 2 1  PHQ - 2 Score 4 2  Altered sleeping 3 -  Tired, decreased energy 1 -  Change in appetite 0 -  Feeling bad or failure about yourself  0 -  Trouble concentrating 1 -  Moving slowly or fidgety/restless 0 -  Suicidal thoughts 0 -  PHQ-9 Score 9 -  Difficult doing work/chores  Somewhat difficult -  Some recent data might be hidden    Goals Addressed            This Visit's Progress     Patient Stated   . "I would like to feel less depressed" (pt-stated)       Current Barriers:  . Chronic Disease Management support and education needs related to depression and anxiety.  Nurse Case Manager Clinical Goal(s):  [name] Over the next 30 days, patient will verbalize understanding of plan for depression management. . Over the next 30 days, patient will attend all scheduled medical appointments: PCP and CCM team . Over the next 30 days, patient will work with CM clinical social worker to discuss depression management  Interventions:  . Evaluation of current treatment plan related to depression and anxiety and patient's adherence to plan as established by provider. . Reviewed medications with patient and discussed Lexapro and Xanax. Patient would like to try a different or an additional medication to help with depression. Friend suggested Pristiq. [name] Discussed plans with patient for ongoing care management follow up and provided patient with direct contact information for care management team . Social Work referral for depression. [name] RNCM will collaborate with PCP regarding ongoing depression and request to adjust medications.  Patient Self Care Activities:  . Performs ADL's independently . Performs IADL's independently  Initial goal documentation     . "I would like to sleep better" (pt-stated)       Current Barriers:  . Chronic Disease Management support and education needs related to insomnia.  Nurse Case Manager Clinical Goal(s):  [name] Over the next 30 days, patient will work with Consulting civil engineer to address needs related to insomnia  Interventions:  . Evaluation of current treatment plan related to insomnia and patient's adherence to plan as established by provider. . Reviewed medications with patient and discussed Ambien . Discussed plans with patient for  ongoing care management follow up and provided patient with direct contact information for care management team . Recommended Melatonin 1 to 10mg  at 8pm nightly . Educational materials to be printed and mailed on good sleep hygiene . Discussed patient's fears/concerns related to Ambien. Will inform PCP.  Patient Self Care Activities:  . Performs ADL's independently . Performs IADL's independently  Initial goal documentation.inical         Plan:  The care management team will reach out to the patient again over the next 30 days.  The patient has been provided with contact information for the care management team and has been advised to call with any health related questions or concerns.   Referral to embedded [name].   [name] BSN, RN-BC Embedded Chronic Care Manager [facility] [place] Management Direct Dial: [phone]     "I have reviewed this encounter including the documentation in this note and/or discussed this patient with the nurse coordinator, [name], RN . I am  certifying that I agree with the content of this note as supervising physician." [place], FNP

## 2019-03-07 NOTE — Patient Instructions (Signed)
  Goals Addressed            This Visit's Progress     Patient Stated   . "I would like to feel less depressed" (pt-stated)       Current Barriers:  . Chronic Disease Management support and education needs related to depression and anxiety.  Nurse Case Manager Clinical Goal(s):  [name] Over the next 30 days, patient will verbalize understanding of plan for depression management. . Over the next 30 days, patient will attend all scheduled medical appointments: PCP and CCM team . Over the next 30 days, patient will work with CM clinical social worker to discuss depression management  Interventions:  . Evaluation of current treatment plan related to depression and anxiety and patient's adherence to plan as established by provider. . Reviewed medications with patient and discussed Lexapro and Xanax. Patient would like to try a different or an additional medication to help with depression. Friend suggested Pristiq. [name] Discussed plans with patient for ongoing care management follow up and provided patient with direct contact information for care management team . Social Work referral for depression. [name] RNCM will collaborate with PCP regarding ongoing depression and request to adjust medications.  Patient Self Care Activities:  . Performs ADL's independently . Performs IADL's independently  Initial goal documentation     . "I would like to sleep better" (pt-stated)       Current Barriers:  . Chronic Disease Management support and education needs related to insomnia.  Nurse Case Manager Clinical Goal(s):  [name] Over the next 30 days, patient will work with Consulting civil engineer to address needs related to insomnia  Interventions:  . Evaluation of current treatment plan related to insomnia and patient's adherence to plan as established by provider. . Reviewed medications with patient and discussed Ambien . Discussed plans with patient for ongoing care management follow up and provided patient with  direct contact information for care management team . Recommended Melatonin 1 to 10mg  at 8pm nightly . Educational materials to be printed and mailed on good sleep hygiene . Discussed patient's fears/concerns related to Ambien. Will inform PCP.  Patient Self Care Activities:  . Performs ADL's independently . Performs IADL's independently  Initial goal documentation.inical         Plan:  The care management team will reach out to the patient again over the next 30 days.  The patient has been provided with contact information for the care management team and has been advised to call with any health related questions or concerns.   Referral to embedded [name].   [name] BSN, RN-BC Embedded Chronic Care Manager [facility] [place] Management Direct Dial: [phone]

## 2019-03-08 ENCOUNTER — Ambulatory Visit: Payer: Self-pay | Admitting: *Deleted

## 2019-03-08 ENCOUNTER — Ambulatory Visit (INDEPENDENT_AMBULATORY_CARE_PROVIDER_SITE_OTHER): Payer: Medicare HMO | Admitting: Licensed Clinical Social Worker

## 2019-03-08 DIAGNOSIS — J449 Chronic obstructive pulmonary disease, unspecified: Secondary | ICD-10-CM

## 2019-03-08 DIAGNOSIS — I10 Essential (primary) hypertension: Secondary | ICD-10-CM | POA: Diagnosis not present

## 2019-03-08 DIAGNOSIS — I2583 Coronary atherosclerosis due to lipid rich plaque: Secondary | ICD-10-CM

## 2019-03-08 DIAGNOSIS — I2510 Atherosclerotic heart disease of native coronary artery without angina pectoris: Secondary | ICD-10-CM | POA: Diagnosis not present

## 2019-03-08 DIAGNOSIS — F3342 Major depressive disorder, recurrent, in full remission: Secondary | ICD-10-CM

## 2019-03-08 DIAGNOSIS — F411 Generalized anxiety disorder: Secondary | ICD-10-CM

## 2019-03-08 DIAGNOSIS — J441 Chronic obstructive pulmonary disease with (acute) exacerbation: Secondary | ICD-10-CM | POA: Diagnosis not present

## 2019-03-08 DIAGNOSIS — E1159 Type 2 diabetes mellitus with other circulatory complications: Secondary | ICD-10-CM

## 2019-03-08 DIAGNOSIS — R0602 Shortness of breath: Secondary | ICD-10-CM

## 2019-03-08 NOTE — Patient Instructions (Addendum)
  Licensed Clinical Social Worker Visit Information  Goals we discussed today:  Goals Addressed            This Visit's Progress   . Client wants to talk with [name] about depression and decreased motivation of client (pt-stated)       Current Barriers:  . Financial barriers . Social  isolation challenges  Clinical Social Work Clinical Goal(s):  [name] [name] and Client to talk in next 30 days about client management of depression symptoms and talk about client motivation challenges.   Interventions: . Spoke with [name] about CCM program . Talked with Marzella about RNCM support for nursing needs . Talked with client about relaxation techniques (talks on phone with friends, uses computer to play games.  . Talked about ambulation challenges with client . Talked with client about mental health care history of client  . Patient Self Care Activities:  . Takes medications as prescribed . Attends scheduled medical appointments   Plan:   Client to attend scheduled medical appointments  Client to use relaxation techniques of choice to manage symptoms faced [name] to call client in next 3 weeks to assess psychosocial needs of client.   Initial goal documentation         Materials Provided:No   Follow Up Plan: [name] to call client in next 3 weeks to assess psychosocial needs of client  The patient verbalized understanding of instructions provided today and declined a print copy of patient instruction materials.   [name].Yadhira Mckneely MSW, [name] Licensed Clinical Social Worker [facility] Care Management [phone]

## 2019-03-08 NOTE — Chronic Care Management (AMB) (Addendum)
  Care Management Note   [name] is a 63 y.o. year old female who is a primary care patient of [name], [place]. The CM team was consulted for assistance with chronic disease management and care coordination.   I reached out to [name] by phone today.   Review of patient status, including review of consultants reports, relevant laboratory and other test results, and collaboration with appropriate care team members and the patient's provider was performed as part of comprehensive patient evaluation and provision of chronic care management services.   Social Determinants of Health: risk for tobacco use; risk for depression; risk for stress; risk for food insecurity    Chronic Care Management from 03/08/2019 in [place]  PHQ-9 Total Score  8     GAD 7 : Generalized Anxiety Score 03/08/2019 01/11/2018 05/11/2017 04/08/2016  Nervous, Anxious, on Edge 1 2 2 2   Control/stop worrying 2 3 2 2   Worry too much - different things 1 3 2 2   Trouble relaxing 1 2 1 3   Restless 1 1 2 3   Easily annoyed or irritable 0 1 1 3   Afraid - awful might happen 1 0 2 3  Total GAD 7 Score 7 12 12 18   Anxiety Difficulty Somewhat difficult Somewhat difficult Very difficult Somewhat difficult   Goals Addressed            This Visit's Progress   . Client wants to talk with [name] about depression and decreased motivation of client (pt-stated)       Current Barriers:  . Financial barriers . Social  isolation challenges  Clinical Social Work Clinical Goal(s):  [name] [name] and Client to talk in next 30 days about client management of depression symptoms and talk about client motivation challenges.   Interventions: . Spoke with [name] about CCM program . Talked with Eugene about RNCM support for nursing needs . Talked with client about relaxation techniques (talks on phone with friends, uses computer to play games.  . Talked about ambulation challenges with client .  Talked with client about mental health care history of client  . Patient Self Care Activities:  . Takes medications as prescribed . Attends scheduled medical appointments   Plan:   Client to attend scheduled medical appointments  Client to use relaxation techniques of choice to manage symptoms faced [name] to call client in next 3 weeks to assess psychosocial needs of client.   Initial goal documentation       Follow Up Plan:[name] to call client in next 3 weeks to assess psychosocial needs of client  [name].Cassady Stanczak MSW, [name] Licensed Clinical Social Worker [facility] [phone]  "I have reviewed this encounter including the documentation in this note and/or discussed this patient with [name] . I am certifying that I agree with the content of this note as supervising physician." [place], FNP

## 2019-03-08 NOTE — Progress Notes (Addendum)
  Chronic Care Management   Care Coordination Note  03/08/2019 Name: [name] MRN: [id number] DOB: July 02, 1956  Consulted by Tracy Nan, [name] regarding my prior conversatoin with [name] and her desire to change depression medications. Reviewed chart and sent an in-basket message to PCP.   Follow up plan: The care management team will reach out to the patient again over the next 7 days.   [name] BSN, RN-BC Embedded Chronic Care Manager [facility] Direct Dial: [phone]    "I have reviewed this encounter including the documentation in this note and/or discussed this patient with the nurse coordinator, Tracy Sicilian, RN . I am certifying that I agree with the content of this note as supervising physician." [place], FNP

## 2019-03-13 ENCOUNTER — Telehealth: Payer: Self-pay | Admitting: *Deleted

## 2019-03-13 ENCOUNTER — Ambulatory Visit (INDEPENDENT_AMBULATORY_CARE_PROVIDER_SITE_OTHER): Payer: Medicare HMO | Admitting: Nurse Practitioner

## 2019-03-13 ENCOUNTER — Encounter: Payer: Self-pay | Admitting: Nurse Practitioner

## 2019-03-13 DIAGNOSIS — F3342 Major depressive disorder, recurrent, in full remission: Secondary | ICD-10-CM

## 2019-03-13 MED ORDER — DESVENLAFAXINE SUCCINATE ER 50 MG PO TB24: 50.0000 mg | ORAL_TABLET | Freq: Every day | ORAL | 3 refills | Status: DC

## 2019-03-13 NOTE — Progress Notes (Signed)
Virtual Visit via telephone Note Due to COVID-19 pandemic this visit was conducted virtually. This visit type was conducted due to national recommendations for restrictions regarding the COVID-19 Pandemic (e.g. social distancing, sheltering in place) in an effort to limit this patient's exposure and mitigate transmission in our community. All issues noted in this document were discussed and addressed.  A physical exam was not performed with this format.  I connected with [name] on 03/13/19 at 10:40 by telephone and verified that I am speaking with the correct person using two identifiers. [name] is currently located at home  and no one is currently with her during visit. The provider, [name], FNP is located in their office at time of visit.  I discussed the limitations, risks, security and privacy concerns of performing an evaluation and management service by telephone and the availability of in person appointments. I also discussed with the patient that there may be a patient responsible charge related to this service. The patient expressed understanding and agreed to proceed.   History and Present Illness:    Chief Complaint: Depression   HPI Patient wanted a telephone visit today to discuss her depression. She is currently on lexapro and has been on it for awhile. We tired adding wellbutrin and that made her sick. She has talked to several friends and she would like to try pristiq. Depression screen [facility] 2/9 03/08/2019 02/13/2019 11/11/2018  Decreased Interest 1 2 1   Down, Depressed, Hopeless 1 2 1   PHQ - 2 Score 2 4 2   Altered sleeping 2 3 -  Tired, decreased energy 1 1 -  Change in appetite 1 0 -  Feeling bad or failure about yourself  0 0 -  Trouble concentrating 1 1 -  Moving slowly or fidgety/restless 1 0 -  Suicidal thoughts 0 0 -  PHQ-9 Score 8 9 -  Difficult doing work/chores Somewhat difficult Somewhat difficult -  Some recent data might be  hidden     Review of Systems  Psychiatric/Behavioral: Positive for depression. Negative for substance abuse and suicidal ideas. The patient is nervous/anxious and has insomnia.   All other systems reviewed and are negative.    Observations/Objective: Answers all questions -answers all questions appropriately No distress in voice  Assessment and Plan: [name] in today with chief complaint of Depression   1. Recurrent major depressive disorder, in full remission ([place]) Stress management - desvenlafaxine (PRISTIQ) 50 MG 24 hr tablet; Take 1 tablet (50 mg total) by mouth daily.  Dispense: 30 tablet; Refill: 3   Follow Up Instructions: 1 month    I discussed the assessment and treatment plan with the patient. The patient was provided an opportunity to ask questions and all were answered. The patient agreed with the plan and demonstrated an understanding of the instructions.   The patient was advised to call back or seek an in-person evaluation if the symptoms worsen or if the condition fails to improve as anticipated.  The above assessment and management plan was discussed with the patient. The patient verbalized understanding of and has agreed to the management plan. Patient is aware to call the clinic if symptoms persist or worsen. Patient is aware when to return to the clinic for a follow-up visit. Patient educated on when it is appropriate to go to the emergency department.   Time call ended:  1050  I provided 10 minutes of non-face-to-face time during this encounter.    [name], FNP

## 2019-03-13 NOTE — Telephone Encounter (Signed)
appt scheduled Pt notified 

## 2019-03-13 NOTE — Telephone Encounter (Signed)
-----   Message from Ilean China, RN sent at 03/09/2019  1:49 PM EDT ----- Regarding: FW: depression meds Please call patient to schedule an appointment with MMM to discuss changing depression medications.   Thank you!! Cyril Mourning ----- Message ----- From: [name], FNP Sent: 03/09/2019   1:08 PM EDT To: Ilean China, RN Subject: RE: depression meds                            she will need to make an appointment to discuss ----- Message ----- From: Ilean China, RN Sent: 03/08/2019   4:35 PM EDT To: [name], FNP Subject: depression meds                                Hey I talked with Ms Karpf a week or so ago about her depression and insomnia. She's afraid to take the Lorrin Mais because of potential side effects.  A friend recommended Pristiq to help with depression since she's been on her current med, Lexapro I think, for many years. I also talked a little about Abilify as a possibility and encouraged physical activity to improve mood. Scott talked with her today and she mentioned the Pristiq again. I told him that we would have to defer to you.   Let me know what you think.  Thanks! Cyril Mourning

## 2019-03-21 ENCOUNTER — Telehealth: Payer: Medicare HMO | Admitting: *Deleted

## 2019-03-21 DIAGNOSIS — H524 Presbyopia: Secondary | ICD-10-CM | POA: Diagnosis not present

## 2019-03-21 LAB — HM DIABETES EYE EXAM

## 2019-03-30 ENCOUNTER — Ambulatory Visit (INDEPENDENT_AMBULATORY_CARE_PROVIDER_SITE_OTHER): Payer: Medicare HMO | Admitting: Licensed Clinical Social Worker

## 2019-03-30 DIAGNOSIS — R0602 Shortness of breath: Secondary | ICD-10-CM

## 2019-03-30 DIAGNOSIS — J441 Chronic obstructive pulmonary disease with (acute) exacerbation: Secondary | ICD-10-CM

## 2019-03-30 DIAGNOSIS — E1159 Type 2 diabetes mellitus with other circulatory complications: Secondary | ICD-10-CM | POA: Diagnosis not present

## 2019-03-30 DIAGNOSIS — F411 Generalized anxiety disorder: Secondary | ICD-10-CM

## 2019-03-30 DIAGNOSIS — F3342 Major depressive disorder, recurrent, in full remission: Secondary | ICD-10-CM | POA: Diagnosis not present

## 2019-03-30 DIAGNOSIS — I10 Essential (primary) hypertension: Secondary | ICD-10-CM

## 2019-03-30 DIAGNOSIS — I2583 Coronary atherosclerosis due to lipid rich plaque: Secondary | ICD-10-CM | POA: Diagnosis not present

## 2019-03-30 DIAGNOSIS — I2510 Atherosclerotic heart disease of native coronary artery without angina pectoris: Secondary | ICD-10-CM

## 2019-03-30 NOTE — Patient Instructions (Signed)
Licensed Clinical Social Worker Visit Information  Goals we discussed today:  Goals        . Client wants to talk with [name] about depression and decreased motivation of client (pt-stated)     Current Barriers:  . Financial barriers . Social  isolation challenges  Clinical Social Work Clinical Goal(s):  [name] [name] and Client to talk in next 30 days about client management of depression symptoms and talk about client motivation challenges.   Interventions: . Spoke with [name] about CCM program . Talked with Braylin about RNCM support for nursing needs . Talked with client about relaxation techniques (talks on phone with friends, uses computer to play games.  . Talked about ambulation challenges with client . Talked with client about legal issues faced by client  . Patient Self Care Activities:  . Takes medications as prescribed . Attends scheduled medical appointments   Plan:   Client to attend scheduled medical appointments  Client to use relaxation techniques of choice to manage symptoms faced [name] to call client in next 3 weeks to assess psychosocial needs of client.   Initial goal documentation     .    Materials Provided: No  Follow Up Plan: [name] to call client in next 3 weeks to assess the psychosocial needs of client at that time.  The patient verbalized understanding of instructions provided today and declined a print copy of patient instruction materials.   [name].Jazziel Fitzsimmons MSW, [name] Licensed Clinical Social Worker [facility] [phone]

## 2019-03-30 NOTE — Chronic Care Management (AMB) (Addendum)
Care Management Note   [name] is a 63 y.o. year old female who is a primary care patient of [name], [place]. The CM team was consulted for assistance with chronic disease management and care coordination.   I reached out to [name] by phone today.   Review of patient status, including review of consultants reports, relevant laboratory and other test results, and collaboration with appropriate care team members and the patient's provider was performed as part of comprehensive patient evaluation and provision of chronic care management services.   Social Determinants of Health: risk of social isolation; risk of tobacco use; risk of depression; risk of food insecurity; risk of stress    Office Visit from 03/13/2019 in [place]  PHQ-9 Total Score  13     GAD 7 : Generalized Anxiety Score 03/08/2019 01/11/2018 05/11/2017 04/08/2016  Nervous, Anxious, on Edge 1 2 2 2   Control/stop worrying 2 3 2 2   Worry too much - different things 1 3 2 2   Trouble relaxing 1 2 1 3   Restless 1 1 2 3   Easily annoyed or irritable 0 1 1 3   Afraid - awful might happen 1 0 2 3  Total GAD 7 Score 7 12 12 18   Anxiety Difficulty Somewhat difficult Somewhat difficult Very difficult Somewhat difficult   Medications    albuterol (PROVENTIL HFA;VENTOLIN HFA) 108 (90 Base) MCG/ACT inhaler    ALPRAZolam (XANAX) 1 MG tablet    aspirin 325 MG tablet    atorvastatin (LIPITOR) 80 MG tablet    Cholecalciferol (VITAMIN D3) 5000 units TABS    cyclobenzaprine (FLEXERIL) 10 MG tablet    desvenlafaxine (PRISTIQ) 50 MG 24 hr tablet    escitalopram (LEXAPRO) 20 MG tablet    fish oil-omega-3 fatty acids 1000 MG capsule    fluticasone furoate-vilanterol (BREO ELLIPTA) 100-25 MCG/INH AEPB    isosorbide mononitrate (IMDUR) 30 MG 24 hr tablet    lisinopril (ZESTRIL) 40 MG tablet    metFORMIN (GLUCOPHAGE) 500 MG tablet    metoprolol tartrate (LOPRESSOR) 25 MG tablet    pramipexole (MIRAPEX) 0.5 MG tablet    vitamin B-12 (CYANOCOBALAMIN) 1000 MCG tablet    zolpidem (AMBIEN) 5 MG tablet      Goals        . Client wants to talk with [name] about depression and decreased motivation of client (pt-stated)     Current Barriers:  . Financial barriers . Social  isolation challenges  Clinical Social Work Clinical Goal(s):  [name] [name] and Client to talk in next 30 days about client management of depression symptoms and talk about client motivation challenges.   Interventions: . Spoke with [name] about CCM program . Talked with Madeliene about RNCM support for nursing needs . Talked with client about relaxation techniques (talks on phone with friends, uses computer to play games.  . Talked about ambulation challenges with client . Talked with client about legal issues faced by client  . Patient Self Care Activities:  . Takes medications as prescribed . Attends scheduled medical appointments   Plan:   Client to attend scheduled medical appointments  Client to use relaxation techniques of choice to manage symptoms faced [name] to call client in next 3 weeks to assess psychosocial needs of client.   Initial goal documentation     Client has prescribed medications and is taking medications as prescribed. [name] talked with client about relaxation techniques for client. Client has had some difficulty sleeping.  She  said she takes her prescribed medications at night.  She said she has  been waking up about 5:00 AM recently for several days and it is hard to go back to sleep.She said she is eating adequately. Client has some difficulty walking.  She spoke of knee pain in right knee.  Client and [name] spoke of legal issues facing client. [name] encouraged Kameisha to call RNCM to discuss nursing needs of client  Follow Up Plan: [name] to call client in next 3 weeks to assess the psychosocial needs of client at that time  [name].Amauri Keefe MSW, [name] Licensed Clinical Social Worker  [facility] [phone]  "I have reviewed this encounter including the documentation in this note and/or discussed this patient with [name] . I am certifying that I agree with the content of this note as supervising physician." [place], FNP

## 2019-04-14 DIAGNOSIS — H524 Presbyopia: Secondary | ICD-10-CM | POA: Diagnosis not present

## 2019-04-14 DIAGNOSIS — H5213 Myopia, bilateral: Secondary | ICD-10-CM | POA: Diagnosis not present

## 2019-04-14 DIAGNOSIS — H52209 Unspecified astigmatism, unspecified eye: Secondary | ICD-10-CM | POA: Diagnosis not present

## 2019-04-25 ENCOUNTER — Telehealth: Payer: Self-pay | Admitting: *Deleted

## 2019-04-25 ENCOUNTER — Ambulatory Visit: Payer: Self-pay | Admitting: Licensed Clinical Social Worker

## 2019-04-25 ENCOUNTER — Other Ambulatory Visit: Payer: Self-pay

## 2019-04-25 ENCOUNTER — Ambulatory Visit (INDEPENDENT_AMBULATORY_CARE_PROVIDER_SITE_OTHER): Payer: Medicare HMO | Admitting: Family

## 2019-04-25 ENCOUNTER — Encounter: Payer: Self-pay | Admitting: Family

## 2019-04-25 ENCOUNTER — Ambulatory Visit: Payer: Self-pay | Admitting: *Deleted

## 2019-04-25 VITALS — BP 155/81 | HR 90 | Temp 97.5°F | Ht 63.0 in | Wt 213.0 lb

## 2019-04-25 DIAGNOSIS — F3342 Major depressive disorder, recurrent, in full remission: Secondary | ICD-10-CM

## 2019-04-25 DIAGNOSIS — E1159 Type 2 diabetes mellitus with other circulatory complications: Secondary | ICD-10-CM

## 2019-04-25 DIAGNOSIS — R0602 Shortness of breath: Secondary | ICD-10-CM

## 2019-04-25 DIAGNOSIS — R21 Rash and other nonspecific skin eruption: Secondary | ICD-10-CM

## 2019-04-25 DIAGNOSIS — F411 Generalized anxiety disorder: Secondary | ICD-10-CM

## 2019-04-25 DIAGNOSIS — Z7189 Other specified counseling: Secondary | ICD-10-CM

## 2019-04-25 DIAGNOSIS — J441 Chronic obstructive pulmonary disease with (acute) exacerbation: Secondary | ICD-10-CM

## 2019-04-25 MED ORDER — HYDROXYZINE PAMOATE 25 MG PO CAPS: 25.0000 mg | ORAL_CAPSULE | Freq: Three times a day (TID) | ORAL | 0 refills | Status: DC | PRN

## 2019-04-25 MED ORDER — PREDNISONE 10 MG (21) PO TBPK: ORAL_TABLET | ORAL | 0 refills | Status: DC

## 2019-04-25 NOTE — Telephone Encounter (Signed)
04/25/2019 I was consulted by Theadore Nan, [name] after he spoke with Ms Laurann Montana today. She explained to him the she has rash across her lower back that itches and that there isn't any pain or blistering. She isn't sure what caused it. Patient questioned if she needed an appointment with [name] or if she should talk with the triage nurse. Scott advised that he would ask me my opinion.  Since this is an acute issue and not related to chronic care management, I advised that it would be more appropriate for the clinical staff at [facility] to address the issue and that I will forward this message to them.  [name], BSN, RN-BC Embedded Chronic Care Manager [facility] / [place] [facility] Direct Dial: [phone]

## 2019-04-25 NOTE — Chronic Care Management (AMB) (Addendum)
Care Management Note   [name] is a 63 y.o. year old female who is a primary care patient of [name], [place]. The CM team was consulted for assistance with chronic disease management and care coordination.   I reached out to [name] by phone today.     Review of patient status, including review of consultants reports, relevant laboratory and other test results, and collaboration with appropriate care team members and the patient's provider was performed as part of comprehensive patient evaluation and provision of chronic care management services.   Social Determinants of Health: risk of tobacco use; risk of stress; risk of depression; risk of food insecurity    Office Visit from 03/13/2019 in [place]  PHQ-9 Total Score  13     GAD 7 : Generalized Anxiety Score 03/08/2019 01/11/2018 05/11/2017 04/08/2016  Nervous, Anxious, on Edge 1 2 2 2   Control/stop worrying 2 3 2 2   Worry too much - different things 1 3 2 2   Trouble relaxing 1 2 1 3   Restless 1 1 2 3   Easily annoyed or irritable 0 1 1 3   Afraid - awful might happen 1 0 2 3  Total GAD 7 Score 7 12 12 18   Anxiety Difficulty Somewhat difficult Somewhat difficult Very difficult Somewhat difficult    Medications   New medications from outside sources are available for reconciliation   albuterol (PROVENTIL HFA;VENTOLIN HFA) 108 (90 Base) MCG/ACT inhaler    ALPRAZolam (XANAX) 1 MG tablet    aspirin 325 MG tablet    atorvastatin (LIPITOR) 80 MG tablet    Cholecalciferol (VITAMIN D3) 5000 units TABS    cyclobenzaprine (FLEXERIL) 10 MG tablet    desvenlafaxine (PRISTIQ) 50 MG 24 hr tablet    escitalopram (LEXAPRO) 20 MG tablet    fish oil-omega-3 fatty acids 1000 MG capsule    fluticasone furoate-vilanterol (BREO ELLIPTA) 100-25 MCG/INH AEPB    isosorbide mononitrate (IMDUR) 30 MG 24 hr tablet    lisinopril (ZESTRIL) 40 MG tablet    metFORMIN (GLUCOPHAGE) 500 MG tablet    metoprolol tartrate (LOPRESSOR) 25 MG tablet    pramipexole (MIRAPEX) 0.5 MG tablet    vitamin B-12 (CYANOCOBALAMIN) 1000 MCG tablet    zolpidem (AMBIEN) 5 MG tablet    Goals        . Client wants to talk with [name] about depression and decreased motivation of client (pt-stated)     Current Barriers:  . Financial barriers . Social  isolation challenges  Clinical Social Work Clinical Goal(s):  [name] [name] and Client to talk in next 30 days about client management of depression symptoms and talk about client motivation challenges.   Interventions: . Spoke with [name] about CCM program . Talked with Zianna about RNCM support for nursing needs . Talked with client about relaxation techniques (talks on phone with friends, uses computer to play games.  . Talked about ambulation challenges with client . Talked with client about current client needs  . Patient Self Care Activities:  . Takes medications as prescribed . Attends scheduled medical appointments   Plan:   Client to attend scheduled medical appointments  Client to use relaxation techniques of choice to manage symptoms faced [name] to call client in next 3 weeks to assess psychosocial needs of client.   Initial goal documentation       Follow Up Plan: [name] to call client in next 3 weeks to assess the psychosocial needs of client at that  time  [name].Nabiha Planck MSW, [name] Licensed Clinical Social Worker [place] [facility] [phone]  "I have reviewed this encounter including the documentation in this note and/or discussed this patient with [name] . I am certifying that I agree with the content of this note as supervising physician." [place], FNP

## 2019-04-25 NOTE — Patient Instructions (Signed)
Coordinated care with [facility] clinical team to address rash.    Follow up plan: RN CCM will remain available for chronic care management and care coordination as needed.   [name], BSN, RN-BC Embedded Chronic Care Manager [facility] [place] [facility] Direct Dial: [phone]

## 2019-04-25 NOTE — Progress Notes (Signed)
Subjective:    Patient ID: [name], female    DOB: May 02, 1956, 63 y.o.   MRN: [id number]  Chief Complaint  Patient presents with  . itching rash on back    Rash This is a new problem. The current episode started 1 to 4 weeks ago. The problem has been gradually worsening since onset. The affected locations include the back. The rash is characterized by itchiness, redness and dryness. It is unknown if there was an exposure to a precipitant. Pertinent negatives include no congestion, cough, diarrhea, fatigue, fever, joint pain, shortness of breath or sore throat. Past treatments include anti-itch cream and moisturizer. The treatment provided mild relief.      Review of Systems  Constitutional: Negative for fatigue and fever.  HENT: Negative for congestion and sore throat.   Respiratory: Negative for cough and shortness of breath.   Gastrointestinal: Negative for diarrhea.  Musculoskeletal: Negative for joint pain.  Skin: Positive for rash.  All other systems reviewed and are negative.      Objective:   Physical Exam Vitals signs reviewed.  Constitutional:      General: She is not in acute distress.    Appearance: She is well-developed.  HENT:     Head: Normocephalic and atraumatic.  Eyes:     Pupils: Pupils are equal, round, and reactive to light.  Neck:     Musculoskeletal: Normal range of motion and neck supple.     Thyroid: No thyromegaly.  Cardiovascular:     Rate and Rhythm: Normal rate and regular rhythm.     Heart sounds: Normal heart sounds. No murmur.  Pulmonary:     Effort: Pulmonary effort is normal. No respiratory distress.     Breath sounds: Normal breath sounds. No wheezing.  Abdominal:     General: Bowel sounds are normal. There is no distension.     Palpations: Abdomen is soft.     Tenderness: There is no abdominal tenderness.  Musculoskeletal: Normal range of motion.        General: No tenderness.  Skin:    General: Skin is warm and dry.    Findings: Rash present. Rash is papular.          Comments: Erythemas papular rash on lower lumbar and right upper thigh  Neurological:     Mental Status: She is alert and oriented to person, place, and time.     Cranial Nerves: No cranial nerve deficit.     Deep Tendon Reflexes: Reflexes are normal and symmetric.  Psychiatric:        Behavior: Behavior normal.        Thought Content: Thought content normal.        Judgment: Judgment normal.       BP (!) 155/81   Pulse 90   Temp (!) 97.5 F (36.4 C) (Temporal)   Ht 5\' 3"  (1.6 m)   Wt 213 lb (96.6 kg)   LMP 10/17/2005   SpO2 95%   BMI 37.73 kg/m      Assessment & Plan:  KARLIE AUNG comes in today with chief complaint of itching rash on back   Diagnosis and orders addressed:  1. Rash and nonspecific skin eruption Do not scratch Could be from fleas as dog had flea's, could be allergic reaction. She will treat her home for the fleas. Sedation precaution discussed with vistaril Call office if symptoms do not improve or worsen  - predniSONE (STERAPRED UNI-PAK 21 TAB) 10 MG (21) TBPK  tablet; Use as directed  Dispense: 21 tablet; Refill: 0 - hydrOXYzine (VISTARIL) 25 MG capsule; Take 1 capsule (25 mg total) by mouth 3 (three) times daily as needed.  Dispense: 30 capsule; Refill: 0   Evelina Dun, FNP

## 2019-04-25 NOTE — Telephone Encounter (Signed)
Patient complains of itchy rash on lower back.  Has been using OTC creams at home with no improvement.  Appointment scheduled with [name] this afternoon.

## 2019-04-25 NOTE — Patient Instructions (Signed)
Licensed Clinical Social Worker Visit Information  Goals we discussed today:  Goals        . Client wants to talk with [name] about depression and decreased motivation of client (pt-stated)     Current Barriers:  . Financial barriers . Social  isolation challenges  Clinical Social Work Clinical Goal(s):  [name] [name] and Client to talk in next 30 days about client management of depression symptoms and talk about client motivation challenges.   Interventions: . Spoke with [name] about CCM program . Talked with Lenna about RNCM support for nursing needs . Talked with client about relaxation techniques (talks on phone with friends, uses computer to play games.  . Talked about ambulation challenges with client . Talked with client about current needs of client  . Patient Self Care Activities:  . Takes medications as prescribed . Attends scheduled medical appointments   Plan:   Client to attend scheduled medical appointments  Client to use relaxation techniques of choice to manage symptoms faced [name] to call client in next 3 weeks to assess psychosocial needs of client.   Initial goal documentation     Materials Provided: No  Follow Up Plan: [name] to call client in the next 3 weeks to assess the psychosocial needs of client at that time  The patient verbalized understanding of instructions provided today and declined a print copy of patient instruction materials.   [name].Jeweliana Dudgeon MSW, [name] Licensed Clinical Social Worker [facility] [phone]

## 2019-04-25 NOTE — Patient Instructions (Signed)
Rash, Adult A rash is a change in the color of your skin. A rash can also change the way your skin feels. There are many different conditions and factors that can cause a rash. Some rashes may disappear after a few days, but some may last for a few weeks. Common causes of rashes include:  Viral infections, such as: ? Colds. ? Measles. ? Hand, foot, and mouth disease.  Bacterial infections, such as: ? Scarlet fever. ? Impetigo.  Fungal infections, such as Candida.  Allergic reactions to food, medicines, or skin care products. Follow these instructions at home: The goal of treatment is to stop the itching and keep the rash from spreading. Pay attention to any changes in your symptoms. Follow these instructions to help with your condition: Medicine Take or apply over-the-counter and prescription medicines only as told by your health care provider. These may include:  Corticosteroid creams to treat red or swollen skin.  Anti-itch lotions.  Oral allergy medicines (antihistamines).  Oral corticosteroids for severe symptoms.  Skin care  Apply cool compresses to the affected areas.  [place] not scratch or rub your skin.  Avoid covering the rash. Make sure the rash is exposed to air as much as possible. Managing itching and discomfort  Avoid hot showers or baths, which can make itching worse. A cold shower may help.  Try taking a bath with: ? Epsom salts. Follow manufacturer instructions on the packaging. You can get these at your local pharmacy or grocery store. ? Baking soda. Pour a small amount into the bath as told by your health care provider. ? Colloidal oatmeal. Follow manufacturer instructions on the packaging. You can get this at your local pharmacy or grocery store.  Try applying baking soda paste to your skin. Stir water into baking soda until it reaches a paste-like consistency.  Try applying calamine lotion. This is an over-the-counter lotion that helps to relieve  itchiness.  Keep cool and out of the sun. Sweating and being hot can make itching worse. General instructions   Rest as needed.  Drink enough fluid to keep your urine pale yellow.  Wear loose-fitting clothing.  Avoid scented soaps, detergents, and perfumes. Use gentle soaps, detergents, perfumes, and other cosmetic products.  Avoid any substance that causes your rash. Keep a journal to help track what causes your rash. Write down: ? What you eat. ? What cosmetic products you use. ? What you drink. ? What you wear. This includes jewelry.  Keep all follow-up visits as told by your health care provider. This is important. Contact a health care provider if:  You sweat at night.  You lose weight.  You urinate more than normal.  You urinate less than normal, or you notice that your urine is a darker color than usual.  You feel weak.  You vomit.  Your skin or the whites of your eyes look yellow (jaundice).  Your skin: ? Tingles. ? Is numb.  Your rash: ? Does not go away after several days. ? Gets worse.  You are: ? Unusually thirsty. ? More tired than normal.  You have: ? New symptoms. ? Pain in your abdomen. ? A fever. ? Diarrhea. Get help right away if you:  Have a fever and your symptoms suddenly get worse.  Develop confusion.  Have a severe headache or a stiff neck.  Have severe joint pains or stiffness.  Have a seizure.  Develop a rash that covers all or most of your body. The rash may   or may not be painful.  Develop blisters that: ? Are on top of the rash. ? Grow larger or grow together. ? Are painful. ? Are inside your nose or mouth.  Develop a rash that: ? Looks like purple pinprick-sized spots all over your body. ? Has a "bull's eye" or looks like a target. ? Is not related to sun exposure, is red and painful, and causes your skin to peel. Summary  A rash is a change in the color of your skin. Some rashes disappear after a few days,  but some may last for a few weeks.  The goal of treatment is to stop the itching and keep the rash from spreading.  Take or apply over-the-counter and prescription medicines only as told by your health care provider.  Contact a health care provider if you have new or worsening symptoms.  Keep all follow-up visits as told by your health care provider. This is important. This information is not intended to replace advice given to you by your health care provider. Make sure you discuss any questions you have with your health care provider. Document Released: 07/03/2002 Document Revised: 11/04/2018 Document Reviewed: 02/14/2018 Elsevier Patient Education  2020 [employer].

## 2019-04-25 NOTE — Chronic Care Management (AMB) (Signed)
  Chronic Care Management   Care Coordination Note  04/25/2019 Name: ODETTA FORNESS MRN: [id number] DOB: [birth year]  I was consulted by Theadore Nan, [name] regarding an acute rash that Stasha recently developed over her lower back. Because this was acute issue and not related to chronic care management, I forwarded a telephone note with additional details to the [facility] Clinical team for review and to provide patient assistance.   Follow up plan: RN CCM will remain available for chronic care management and care coordination as needed.   [name], BSN, RN-BC Embedded Chronic Care Manager [facility] / [place] [facility] Direct Dial: [phone]

## 2019-05-14 ENCOUNTER — Other Ambulatory Visit: Payer: Self-pay | Admitting: Nurse Practitioner

## 2019-05-14 DIAGNOSIS — F411 Generalized anxiety disorder: Secondary | ICD-10-CM

## 2019-05-15 ENCOUNTER — Other Ambulatory Visit: Payer: Self-pay

## 2019-05-16 ENCOUNTER — Telehealth: Payer: Self-pay | Admitting: Nurse Practitioner

## 2019-05-16 ENCOUNTER — Ambulatory Visit (INDEPENDENT_AMBULATORY_CARE_PROVIDER_SITE_OTHER): Payer: Medicare HMO | Admitting: Licensed Clinical Social Worker

## 2019-05-16 ENCOUNTER — Ambulatory Visit (INDEPENDENT_AMBULATORY_CARE_PROVIDER_SITE_OTHER): Payer: Medicare HMO | Admitting: Nurse Practitioner

## 2019-05-16 ENCOUNTER — Encounter: Payer: Self-pay | Admitting: Nurse Practitioner

## 2019-05-16 VITALS — BP 175/86 | HR 72 | Temp 98.4°F | Resp 20 | Ht 63.0 in | Wt 220.0 lb

## 2019-05-16 DIAGNOSIS — I2583 Coronary atherosclerosis due to lipid rich plaque: Secondary | ICD-10-CM | POA: Diagnosis not present

## 2019-05-16 DIAGNOSIS — G2581 Restless legs syndrome: Secondary | ICD-10-CM

## 2019-05-16 DIAGNOSIS — E1159 Type 2 diabetes mellitus with other circulatory complications: Secondary | ICD-10-CM

## 2019-05-16 DIAGNOSIS — I2510 Atherosclerotic heart disease of native coronary artery without angina pectoris: Secondary | ICD-10-CM

## 2019-05-16 DIAGNOSIS — E7800 Pure hypercholesterolemia, unspecified: Secondary | ICD-10-CM | POA: Diagnosis not present

## 2019-05-16 DIAGNOSIS — C50212 Malignant neoplasm of upper-inner quadrant of left female breast: Secondary | ICD-10-CM | POA: Diagnosis not present

## 2019-05-16 DIAGNOSIS — F411 Generalized anxiety disorder: Secondary | ICD-10-CM

## 2019-05-16 DIAGNOSIS — I213 ST elevation (STEMI) myocardial infarction of unspecified site: Secondary | ICD-10-CM

## 2019-05-16 DIAGNOSIS — J449 Chronic obstructive pulmonary disease, unspecified: Secondary | ICD-10-CM

## 2019-05-16 DIAGNOSIS — R0602 Shortness of breath: Secondary | ICD-10-CM

## 2019-05-16 DIAGNOSIS — E559 Vitamin D deficiency, unspecified: Secondary | ICD-10-CM | POA: Diagnosis not present

## 2019-05-16 DIAGNOSIS — I10 Essential (primary) hypertension: Secondary | ICD-10-CM | POA: Diagnosis not present

## 2019-05-16 DIAGNOSIS — F3342 Major depressive disorder, recurrent, in full remission: Secondary | ICD-10-CM

## 2019-05-16 DIAGNOSIS — J441 Chronic obstructive pulmonary disease with (acute) exacerbation: Secondary | ICD-10-CM

## 2019-05-16 LAB — BAYER DCA HB A1C WAIVED: HB A1C (BAYER DCA - WAIVED): 6.8 % (ref <7.0)

## 2019-05-16 MED ORDER — LISINOPRIL-HYDROCHLOROTHIAZIDE 20-12.5 MG PO TABS: 2.0000 | ORAL_TABLET | Freq: Every day | ORAL | 1 refills | Status: DC

## 2019-05-16 MED ORDER — ALPRAZOLAM 1 MG PO TABS: ORAL_TABLET | ORAL | 2 refills | Status: DC

## 2019-05-16 MED ORDER — ARIPIPRAZOLE 2 MG PO TABS: 2.0000 mg | ORAL_TABLET | Freq: Every day | ORAL | 2 refills | Status: DC

## 2019-05-16 MED ORDER — ALPRAZOLAM 1 MG PO TABS: 1.0000 mg | ORAL_TABLET | Freq: Three times a day (TID) | ORAL | 2 refills | Status: DC | PRN

## 2019-05-16 MED ORDER — DESVENLAFAXINE SUCCINATE ER 50 MG PO TB24: 50.0000 mg | ORAL_TABLET | Freq: Every day | ORAL | 1 refills | Status: DC

## 2019-05-16 NOTE — Chronic Care Management (AMB) (Addendum)
Care Management Note   [name] is a 63 y.o. year old female who is a primary care patient of [name], [place]. The CM team was consulted for assistance with chronic disease management and care coordination.   I reached out to [name] by phone today.   Review of patient status, including review of consultants reports, relevant laboratory and other test results, and collaboration with appropriate care team members and the patient's provider was performed as part of comprehensive patient evaluation and provision of chronic care management services.   Social Determinants of Health: risk of social isolation; risk of tobacco use; risk of stress; risk of food insecurity    Office Visit from 03/13/2019 in [place]  PHQ-9 Total Score  13     GAD 7 : Generalized Anxiety Score 03/08/2019 01/11/2018 05/11/2017 04/08/2016  Nervous, Anxious, on Edge 1 2 2 2   Control/stop worrying 2 3 2 2   Worry too much - different things 1 3 2 2   Trouble relaxing 1 2 1 3   Restless 1 1 2 3   Easily annoyed or irritable 0 1 1 3   Afraid - awful might happen 1 0 2 3  Total GAD 7 Score 7 12 12 18   Anxiety Difficulty Somewhat difficult Somewhat difficult Very difficult Somewhat difficult   Medications    albuterol (PROVENTIL HFA;VENTOLIN HFA) 108 (90 Base) MCG/ACT inhaler    ALPRAZolam (XANAX) 1 MG tablet    aspirin 325 MG tablet    atorvastatin (LIPITOR) 80 MG tablet    Cholecalciferol (VITAMIN D3) 5000 units TABS    cyclobenzaprine (FLEXERIL) 10 MG tablet    desvenlafaxine (PRISTIQ) 50 MG 24 hr tablet    fish oil-omega-3 fatty acids 1000 MG capsule    fluticasone furoate-vilanterol (BREO ELLIPTA) 100-25 MCG/INH AEPB    hydrOXYzine (VISTARIL) 25 MG capsule    isosorbide mononitrate (IMDUR) 30 MG 24 hr tablet    lisinopril (ZESTRIL) 40 MG tablet    metFORMIN (GLUCOPHAGE) 500 MG tablet    metoprolol tartrate (LOPRESSOR) 25 MG tablet    pramipexole (MIRAPEX) 0.5 MG  tablet    predniSONE (STERAPRED UNI-PAK 21 TAB) 10 MG (21) TBPK tablet    vitamin B-12 (CYANOCOBALAMIN) 1000 MCG tablet    zolpidem (AMBIEN) 5 MG tablet     Goals        . Client wants to talk with [name] about depression and decreased motivation of client (pt-stated)     Current Barriers:  . Financial barriers . Social  isolation challenges  Clinical Social Work Clinical Goal(s):  [name] [name] and Client to talk in next 30 days about client management of depression symptoms and talk about client motivation challenges.   Interventions: . Talked with client about social support network (has  2 friends she talks with) . Talked with [name] about [facility] support for nursing needs . Talked with client about relaxation techniques (talks on phone with friends, uses computer to play games.  . Talked about ambulation challenges with client . Talked with client about sleep issues client . Client reported to [name] that she talked on 05/16/2019 with [name]  FNP about client's depression symptoms and sleep challenges .  Talked with client about depression symptoms of client . Talked with client about medication procurement for client  . Patient Self Care Activities:  . Takes medications as prescribed . Attends scheduled medical appointments   Plan:   Client to attend scheduled medical appointments  Client to use  relaxation techniques of choice to manage symptoms faced [name] to call client in next 3 weeks to assess psychosocial needs of client.   Initial goal documentation     .     Follow Up Plan: [name] to call client in next 3 weeks to assess the psychosocial needs of client at that time  [name].Debrina Kizer MSW, [name] Licensed Clinical Social Worker [place] [facility] [phone]  "I have reviewed this encounter including the documentation in this note and/or discussed this patient with [name] . I am certifying that I agree with the  content of this note as supervising physician." [place], FNP

## 2019-05-16 NOTE — Patient Instructions (Signed)

## 2019-05-16 NOTE — Addendum Note (Signed)
Addended by: [name] on: 05/16/2019 04:18 PM   Modules accepted: Orders

## 2019-05-16 NOTE — Patient Instructions (Addendum)
Licensed Clinical Social Worker Visit Information  Goals we discussed today:       . Client wants to talk with [name] about depression and decreased motivation of client (pt-stated)     Current Barriers:  . Financial barriers . Social  isolation challenges  Clinical Social Work Clinical Goal(s):  [name] [name] and Client to talk in next 30 days about client management of depression symptoms and talk about client motivation challenges.   Interventions: . Talked with [name] about RNCM support for nursing needs . Talked with client about relaxation techniques (talks on phone with friends, uses computer to play games.  . Talked about ambulation challenges with client  Talked with client about social support network (has  2 friends she talks with)  Talked with client about sleep issues client  Client reported to [name] that she talked on 05/16/2019 with [name]  FNP about client's depression symptoms and sleep challenges   Talked with client about depression symptoms of client  Talked with client about medication procurement for client   . Patient Self Care Activities:  . Takes medications as prescribed . Attends scheduled medical appointments   Plan:   Client to attend scheduled medical appointments  Client to use relaxation techniques of choice to manage symptoms faced [name] to call client in next 3 weeks to assess psychosocial needs of client.   Initial goal documentation     Materials Provided: No  Follow Up Plan:  [name] to call client in next 3 weeks to assess the psychosocial needs of client at that time  The patient verbalized understanding of instructions provided today and declined a print copy of patient instruction materials.   [name].Zackrey Dyar MSW, [name] Licensed Clinical Social Worker [facility] [phone]

## 2019-05-16 NOTE — Telephone Encounter (Signed)
Rx was sent in for twice daily and the 1/2 as needed. Can you change to TID?

## 2019-05-16 NOTE — Progress Notes (Addendum)
Subjective:    Patient ID: [name], female    DOB: 11-09-1955, 63 y.o.   MRN: [id number]   Chief Complaint: Medical Management of Chronic Issues    HPI:  1. Essential hypertension No c/o chest pain, sob or headache, does  check blood pressure at home and is running in 825 systolic BP Readings from Last 3 Encounters:  04/25/19 (!) 155/81  08/12/18 (!) 156/76  04/22/18 (!) 150/79    2. Type 2 diabetes mellitus with other circulatory complication, without long-term current use of insulin (HCC) She has not been checking her blood sugars at home. She has not been watching her diet. Lab Results  Component Value Date   HGBA1C 6.1 08/12/2018     3. Coronary artery disease due to lipid rich plaque She had heart attack back several years ago and had stent put in. She has not seen cardiology in years.  4. ST elevation myocardial infarction (STEMI), unspecified artery ([place]) Is on daily aspirin  5. Chronic obstructive pulmonary disease, unspecified COPD type ([place]) Is currently on BREO inhaler and is doing well. Has not needed albuterol in several months  6. Pure hypercholesterolemia Does not watch diet and does no exercise. Lab Results  Component Value Date   CHOL 274 (H) 08/12/2018   HDL 35 (L) 08/12/2018   LDLCALC 185 (H) 08/12/2018   LDLDIRECT 100.0 05/16/2007   TRIG 272 (H) 08/12/2018   CHOLHDL 7.8 (H) 08/12/2018     7. Restless leg syndrome Is on mirapex nightly and is doing well. She says it is working well for her.  8. Vitamin D deficiency Takes daily vitamin d when she remembrs to take.  9. Recurrent major depressive disorder, in full remission [facility]) Patient says she is not doing well on pristiq. We had changed her from lexapro and she says she is not doing well. She says she is going through a lot at home. Depression screen [facility] 2/9 05/16/2019 04/25/2019 03/13/2019  Decreased Interest 3 0 2  Down, Depressed, Hopeless 3 0 2  PHQ - 2 Score 6 0 4  Altered  sleeping 3 - 2  Tired, decreased energy 3 - 2  Change in appetite 2 - 3  Feeling bad or failure about yourself  3 - 1  Trouble concentrating 3 - 1  Moving slowly or fidgety/restless 1 - 0  Suicidal thoughts 1 - 0  PHQ-9 Score 22 - 13  Difficult doing work/chores - - Somewhat difficult  Some recent data might be hidden     10. Generalized anxiety disorder .is on xanax 84m 2 1/2 daly  GAD 7 : Generalized Anxiety Score 05/16/2019 03/08/2019 01/11/2018 05/11/2017  Nervous, Anxious, on Edge 2 1 2 2   Control/stop worrying 3 2 3 2   Worry too much - different things 3 1 3 2   Trouble relaxing 2 1 2 1   Restless 2 1 1 2   Easily annoyed or irritable 1 0 1 1  Afraid - awful might happen 3 1 0 2  Total GAD 7 Score 16 7 12 12   Anxiety Difficulty - Somewhat difficult Somewhat difficult Very difficult      11. Malignant neoplasm of upper-inner quadrant of left female breast, unspecified estrogen receptor status ([place] this was back in 2012-2013 and she has had no reoccurence.  12. Morbid obesity ([place] No recent weight changes Wt Readings from Last 3 Encounters:  05/16/19 220 lb (99.8 kg)  04/25/19 213 lb (96.6 kg)  08/12/18 209 lb (94.8 kg)  BMI Readings from Last 3 Encounters:  05/16/19 38.97 kg/m  04/25/19 37.73 kg/m  08/12/18 37.02 kg/m       Outpatient Encounter Medications as of 05/16/2019  Medication Sig  . albuterol (PROVENTIL HFA;VENTOLIN HFA) 108 (90 Base) MCG/ACT inhaler Inhale 2 puffs into the lungs every 6 (six) hours as needed for wheezing or shortness of breath.  . ALPRAZolam (XANAX) 1 MG tablet TAKE 1 TABLET BY MOUTH TWICE DAILY AND 1/2 (ONE-HALF) TABLET AS NEEDED  . aspirin 325 MG tablet Take 325 mg by mouth daily.    [name] atorvastatin (LIPITOR) 80 MG tablet Take 1 tablet (80 mg total) by mouth daily. (Needs to be seen before next refill)  . Cholecalciferol (VITAMIN D3) 5000 units TABS Take 1 tablet by mouth daily.  [name] desvenlafaxine (PRISTIQ) 50 MG 24 hr tablet  Take 1 tablet (50 mg total) by mouth daily.  . fish oil-omega-3 fatty acids 1000 MG capsule Take 2 g by mouth 2 (two) times daily.   . fluticasone furoate-vilanterol (BREO ELLIPTA) 100-25 MCG/INH AEPB Inhale 1 puff into the lungs daily.  . hydrOXYzine (VISTARIL) 25 MG capsule Take 1 capsule (25 mg total) by mouth 3 (three) times daily as needed.  . isosorbide mononitrate (IMDUR) 30 MG 24 hr tablet Take 0.5 tablets (15 mg total) by mouth daily.  [name] lisinopril (ZESTRIL) 40 MG tablet Take 1 tablet (40 mg total) by mouth daily.  . metFORMIN (GLUCOPHAGE) 500 MG tablet Take 1 tablet (500 mg total) by mouth 2 (two) times daily with a meal.  . metoprolol tartrate (LOPRESSOR) 25 MG tablet Take 1 tablet (25 mg total) by mouth 2 (two) times daily.  . pramipexole (MIRAPEX) 0.5 MG tablet Take 1 tablet (0.5 mg total) by mouth 2 (two) times daily.  . vitamin B-12 (CYANOCOBALAMIN) 1000 MCG tablet Take 1,000 mcg by mouth daily.     Past Surgical History:  Procedure Laterality Date  . AXILLARY LYMPH NODE DISSECTION  07/31/2011   Procedure: AXILLARY LYMPH NODE DISSECTION;  Surgeon: [name], MD;  Location: [facility];  Service: General;  Laterality: Left;  left axillary sentinal node biopsy  . BREAST SURGERY    . CARDIAC CATHETERIZATION  11/02/2006; 06/03/2011  . CESAREAN SECTION  1987  . CORONARY STENT PLACEMENT  11/02/2006  . ELBOW SURGERY     right  . MASTECTOMY PARTIAL / LUMPECTOMY  06/30/2011   left    Family History  Problem Relation Age of Onset  . Heart disease Mother   . Heart disease Father   . Cancer Maternal Aunt        pt unaware of what kind  . Cancer Maternal Grandmother        ovarian  . Heart attack Sister   . Bipolar disorder Sister     New complaints: None today   Social history: Would not discuss home issues at first, but did finally say that her daughter will not let her see her grandchildren  Controlled substance contract: 05/16/19    Review of  Systems  Constitutional: Negative for activity change and appetite change.  HENT: Negative.   Eyes: Negative for pain.  Respiratory: Positive for cough and shortness of breath (unchanged for years).   Cardiovascular: Negative for chest pain, palpitations and leg swelling.  Gastrointestinal: Negative for abdominal pain.  Endocrine: Negative for polydipsia.  Genitourinary: Negative.   Skin: Negative for rash.  Neurological: Negative for dizziness, weakness and headaches.  Hematological: Does not bruise/bleed easily.  Psychiatric/Behavioral:  Negative.   All other systems reviewed and are negative.      Objective:   Physical Exam Vitals signs and nursing note reviewed.  Constitutional:      General: She is not in acute distress.    Appearance: Normal appearance. She is well-developed.  HENT:     Head: Normocephalic.     Nose: Nose normal.  Eyes:     Pupils: Pupils are equal, round, and reactive to light.  Neck:     Musculoskeletal: Normal range of motion and neck supple.     Vascular: No carotid bruit or JVD.  Cardiovascular:     Rate and Rhythm: Normal rate and regular rhythm.     Heart sounds: Normal heart sounds.  Pulmonary:     Effort: No respiratory distress.     Breath sounds: Wheezing (expiratory) present. No rales.  Chest:     Chest wall: No tenderness.  Abdominal:     General: Bowel sounds are normal. There is no distension or abdominal bruit.     Palpations: Abdomen is soft. There is no hepatomegaly, splenomegaly, mass or pulsatile mass.     Tenderness: There is no abdominal tenderness.  Musculoskeletal: Normal range of motion.  Lymphadenopathy:     Cervical: No cervical adenopathy.  Skin:    General: Skin is warm and dry.  Neurological:     Mental Status: She is alert and oriented to person, place, and time.     Deep Tendon Reflexes: Reflexes are normal and symmetric.  Psychiatric:        Mood and Affect: Mood is depressed. Affect is tearful.         Behavior: Behavior normal.        Thought Content: Thought content normal.        Judgment: Judgment normal.     BP (!) 175/86   Pulse 72   Temp 98.4 F (36.9 C) (Temporal)   Resp 20   Ht 5' 3"  (1.6 m)   Wt 220 lb (99.8 kg)   LMP 10/17/2005   SpO2 95%   BMI 38.97 kg/m    hgba1c 6.8%    Assessment & Plan:  PAULLA MCCLASKEY comes in today with chief complaint of Medical Management of Chronic Issues   Diagnosis and orders addressed:  1. Essential hypertension Low sodium diet Changed lisinopril to  Lisinopril 20/12.5 2 daily - CMP14+EGFR - lisinopril-hydrochlorothiazide (ZESTORETIC) 20-12.5 MG tablet; Take 2 tablets by mouth daily.  Dispense: 180 tablet; Refill: 1  2. Type 2 diabetes mellitus with other circulatory complication, without long-term current use of insulin (HCC) Continue to watch carbs in diet - Bayer DCA Hb A1c Waived - Lipid panel - Microalbumin / creatinine urine ratio  3. Coronary artery disease due to lipid rich plaque Need to follow up with cardiology   4. ST elevation myocardial infarction (STEMI), unspecified artery ([place])  5. Chronic obstructive pulmonary disease, unspecified COPD type ([place]) Continue BREO as rx albuterol as needed  6. Pure hypercholesterolemia Low fat diet  7. Restless leg syndrome Continue mirapex as rx Keep legs warm at night  8. Vitamin D deficiency Continue daily vitamin d supplement  9. Recurrent major depressive disorder, in full remission ([place]) Added abilify 67m daily to meds Stress management discussed - desvenlafaxine (PRISTIQ) 50 MG 24 hr tablet; Take 1 tablet (50 mg total) by mouth daily.  Dispense: 90 tablet; Refill: 1  10. Generalized anxiety disorder Increase xanax to 171mTID - ALPRAZolam (XANAX) 1 MG tablet; TAKE  1 TABLET BY MOUTH TWICE DAILY AND 1/2 (ONE-HALF) TABLET AS NEEDED  Dispense: 90 tablet; Refill: 2  11. Malignant neoplasm of upper-inner quadrant of left female breast, unspecified estrogen  receptor status ([place])  12. Morbid obesity ([place]) Discussed diet and exercise for person with BMI >25 Will recheck weight in 3-6 months    Labs pending Health Maintenance reviewed Diet and exercise encouraged  Follow up plan: 3 months   [name], FNP

## 2019-05-16 NOTE — Telephone Encounter (Signed)
Increased xanax to 1mg  3x a day from 1 mg bid with extra 1/2 to take prn

## 2019-05-17 ENCOUNTER — Telehealth: Payer: Self-pay | Admitting: *Deleted

## 2019-05-17 ENCOUNTER — Other Ambulatory Visit: Payer: Self-pay | Admitting: Nurse Practitioner

## 2019-05-17 DIAGNOSIS — F411 Generalized anxiety disorder: Secondary | ICD-10-CM

## 2019-05-17 LAB — CMP14+EGFR
ALT: 28 IU/L (ref 0–32)
AST: 20 IU/L (ref 0–40)
Albumin/Globulin Ratio: 2.1 (ref 1.2–2.2)
Albumin: 4.4 g/dL (ref 3.8–4.8)
Alkaline Phosphatase: 92 IU/L (ref 39–117)
BUN/Creatinine Ratio: 13 (ref 12–28)
BUN: 11 mg/dL (ref 8–27)
Bilirubin Total: 0.4 mg/dL (ref 0.0–1.2)
CO2: 24 mmol/L (ref 20–29)
Calcium: 9.8 mg/dL (ref 8.7–10.3)
Chloride: 102 mmol/L (ref 96–106)
Creatinine, Ser: 0.88 mg/dL (ref 0.57–1.00)
GFR calc Af Amer: 81 mL/min/1.73 (ref >59)
GFR calc non Af Amer: 71 mL/min/1.73 (ref >59)
Globulin, Total: 2.1 g/dL (ref 1.5–4.5)
Glucose: 171 mg/dL — ABNORMAL HIGH (ref 65–99)
Potassium: 4.5 mmol/L (ref 3.5–5.2)
Sodium: 141 mmol/L (ref 134–144)
Total Protein: 6.5 g/dL (ref 6.0–8.5)

## 2019-05-17 LAB — LIPID PANEL
Chol/HDL Ratio: 4.1 ratio (ref 0.0–4.4)
Cholesterol, Total: 145 mg/dL (ref 100–199)
HDL: 35 mg/dL — ABNORMAL LOW (ref >39)
LDL Chol Calc (NIH): 75 mg/dL (ref 0–99)
Triglycerides: 212 mg/dL — ABNORMAL HIGH (ref 0–149)
VLDL Cholesterol Cal: 35 mg/dL (ref 5–40)

## 2019-05-17 MED ORDER — ALPRAZOLAM 1 MG PO TABS: 1.0000 mg | ORAL_TABLET | Freq: Three times a day (TID) | ORAL | 2 refills | Status: DC | PRN

## 2019-05-17 NOTE — Telephone Encounter (Signed)
Fax from [facility] Please clarify directions Sig reads: take 1 tab TID prn for anxiety. Take 1 tab BID and 1/2 tab prn

## 2019-05-19 ENCOUNTER — Telehealth: Payer: Self-pay | Admitting: *Deleted

## 2019-05-19 ENCOUNTER — Telehealth: Payer: Self-pay | Admitting: Nurse Practitioner

## 2019-05-19 NOTE — Telephone Encounter (Signed)
Spoke with pt = aware new dose xanax was sent to [facility] on 05/17/19.

## 2019-05-19 NOTE — Telephone Encounter (Signed)
Pt calls crying b/c [facility] will not give her the difference of her xanax rx until 06/12/19  (which is when her #75 pill that she picked up runs out and the next one is due)  She is scared that her insurance will not pay then - they may her wait until the 30 day mark. ( this has happened in past)   I told her that instead of taking TID regulary - maybe she should ration them and only take PRN TID as RXd. OR she could pay out of pocket that day IF she has to have them and insurance will not pay.    She is crying and up set and says "no one understands" and hung up on me.

## 2019-05-23 ENCOUNTER — Ambulatory Visit: Payer: Self-pay | Admitting: Nurse Practitioner

## 2019-06-06 ENCOUNTER — Ambulatory Visit: Payer: Self-pay | Admitting: Licensed Clinical Social Worker

## 2019-06-06 ENCOUNTER — Telehealth: Payer: Self-pay | Admitting: Nurse Practitioner

## 2019-06-06 DIAGNOSIS — I10 Essential (primary) hypertension: Secondary | ICD-10-CM

## 2019-06-06 DIAGNOSIS — F411 Generalized anxiety disorder: Secondary | ICD-10-CM

## 2019-06-06 DIAGNOSIS — E1159 Type 2 diabetes mellitus with other circulatory complications: Secondary | ICD-10-CM

## 2019-06-06 DIAGNOSIS — R0602 Shortness of breath: Secondary | ICD-10-CM

## 2019-06-06 DIAGNOSIS — J449 Chronic obstructive pulmonary disease, unspecified: Secondary | ICD-10-CM

## 2019-06-06 DIAGNOSIS — F3342 Major depressive disorder, recurrent, in full remission: Secondary | ICD-10-CM

## 2019-06-06 DIAGNOSIS — I2510 Atherosclerotic heart disease of native coronary artery without angina pectoris: Secondary | ICD-10-CM

## 2019-06-06 DIAGNOSIS — I2583 Coronary atherosclerosis due to lipid rich plaque: Secondary | ICD-10-CM

## 2019-06-06 NOTE — Chronic Care Management (AMB) (Addendum)
Care Management Note   [name] is a 63 y.o. year old female who is a primary care patient of [name], [place]. The CM team was consulted for assistance with chronic disease management and care coordination.   I reached out to [name] by phone today.    Review of patient status, including review of consultants reports, relevant laboratory and other test results, and collaboration with appropriate care team members and the patient's provider was performed as part of comprehensive patient evaluation and provision of chronic care management services.   Social determinants of health: risk of tobacco use; risk of social isolation; risk of stress; risk of food insecurity;risk of physical inactivity    Office Visit from 05/16/2019 in [facility]  PHQ-9 Total Score  22     GAD 7 : Generalized Anxiety Score 05/16/2019 03/08/2019 01/11/2018 05/11/2017  Nervous, Anxious, on Edge 2 1 2 2   Control/stop worrying 3 2 3 2   Worry too much - different things 3 1 3 2   Trouble relaxing 2 1 2 1   Restless 2 1 1 2   Easily annoyed or irritable 1 0 1 1  Afraid - awful might happen 3 1 0 2  Total GAD 7 Score 16 7 12 12   Anxiety Difficulty - Somewhat difficult Somewhat difficult Very difficult   Medications    albuterol (PROVENTIL HFA;VENTOLIN HFA) 108 (90 Base) MCG/ACT inhaler    ALPRAZolam (XANAX) 1 MG tablet    ARIPiprazole (ABILIFY) 2 MG tablet    aspirin 325 MG tablet    atorvastatin (LIPITOR) 80 MG tablet    Cholecalciferol (VITAMIN D3) 5000 units TABS    desvenlafaxine (PRISTIQ) 50 MG 24 hr tablet    fish oil-omega-3 fatty acids 1000 MG capsule    fluticasone furoate-vilanterol (BREO ELLIPTA) 100-25 MCG/INH AEPB    hydrOXYzine (VISTARIL) 25 MG capsule    isosorbide mononitrate (IMDUR) 30 MG 24 hr tablet    lisinopril-hydrochlorothiazide (ZESTORETIC) 20-12.5 MG tablet    metFORMIN (GLUCOPHAGE) 500 MG tablet    metoprolol tartrate (LOPRESSOR) 25 MG  tablet    pramipexole (MIRAPEX) 0.5 MG tablet    vitamin B-12 (CYANOCOBALAMIN) 1000 MCG tablet      Goals        . Client wants to talk with [name] about depression and decreased motivation of client (pt-stated)     Current Barriers:  . Financial barriers . Social  isolation challenges  Clinical Social Work Clinical Goal(s):  [name] [name] and Client to talk in next 30 days about client management of depression symptoms and talk about client motivation challenges.   Interventions:  Provided counseling support for client  Talked previously with client about social support network (has  2 friends she talks with)  Talked previously with [name] about RNCM support for nursing needs  Talked with client about relaxation techniques (talks on phone with friends, uses computer to play games.   Talked previously with client about ambulation challenges with client  Previously talked with client about depression symptoms of client  Talked with client about relationship issues with her daughter and son in law  . Patient Self Care Activities:  . Takes medications as prescribed . Attends scheduled medical appointments   Plan:   Client to attend scheduled medical appointments  Client to use relaxation techniques of choice to manage symptoms faced [name] to call client in next 3 weeks to assess psychosocial needs of client.   Initial goal documentation  Follow Up Plan: [name] to call client in next 3 weeks to assess the psychosocial needs of client at that time  [name].Deven Furia MSW, [name] Licensed Clinical Social Worker [facility] [phone]  "I have reviewed this encounter including the documentation in this note and/or discussed this patient with [name] . I am certifying that I agree with the content of this note as supervising physician." [place], FNP

## 2019-06-06 NOTE — Patient Instructions (Addendum)
Licensed Clinical Social Worker Visit Information  Goals we discussed today:       . Client wants to talk with [name] about depression and decreased motivation of client (pt-stated)     Current Barriers:  . Financial barriers . Social  isolation challenges  Clinical Social Work Clinical Goal(s):  [name] [name] and Client to talk in next 30 days about client management of depression symptoms and talk about client motivation challenges.   Interventions:   Provided counseling support for client  Talked previously with client about social support network (has 2 friends she talks with)  Talked previously with [name] about RNCM support for nursing needs  Talked with client about relaxation techniques (talks on phone with friends, uses computer to play games.   Talked previously with client about ambulation challenges with client  Previously talked with client about depression symptoms of client  Talked with client about relationship issues with her daughter and son in law   . Patient Self Care Activities:  . Takes medications as prescribed . Attends scheduled medical appointments   Plan:   Client to attend scheduled medical appointments  Client to use relaxation techniques of choice to manage symptoms faced [name] to call client in next 3 weeks to assess psychosocial needs of client.   Initial goal documentation         Materials Provided: No  Follow Up Plan: [name] to call client in next 3 weeks to assess the psychosocial needs of client at that time  The patient verbalized understanding of instructions provided today and declined a print copy of patient instruction materials.   [name].Ebin Palazzi MSW, [name] Licensed Clinical Social Worker [facility] [phone]

## 2019-06-09 MED ORDER — BLOOD GLUCOSE MONITOR SYSTEM W/DEVICE KIT: 1.0000 | PACK | Freq: Two times a day (BID) | 0 refills | Status: DC

## 2019-06-09 MED ORDER — IGLUCOSE TEST STRIPS VI STRP: ORAL_STRIP | 12 refills | Status: DC

## 2019-06-09 MED ORDER — BLOOD GLUCOSE MONITOR KIT: PACK | 0 refills | Status: DC

## 2019-06-09 MED ORDER — LANCETS MISC: 3 refills | Status: DC

## 2019-06-09 NOTE — Telephone Encounter (Signed)
pleae end rx to pharmacy for glucose monitor

## 2019-06-09 NOTE — Telephone Encounter (Signed)
Rx sent for glucometer and supplies, pt aware.

## 2019-06-14 ENCOUNTER — Ambulatory Visit (INDEPENDENT_AMBULATORY_CARE_PROVIDER_SITE_OTHER): Payer: Medicare HMO | Admitting: Licensed Clinical Social Worker

## 2019-06-14 DIAGNOSIS — E1159 Type 2 diabetes mellitus with other circulatory complications: Secondary | ICD-10-CM | POA: Diagnosis not present

## 2019-06-14 DIAGNOSIS — I2583 Coronary atherosclerosis due to lipid rich plaque: Secondary | ICD-10-CM

## 2019-06-14 DIAGNOSIS — F3342 Major depressive disorder, recurrent, in full remission: Secondary | ICD-10-CM | POA: Diagnosis not present

## 2019-06-14 DIAGNOSIS — J449 Chronic obstructive pulmonary disease, unspecified: Secondary | ICD-10-CM | POA: Diagnosis not present

## 2019-06-14 DIAGNOSIS — I10 Essential (primary) hypertension: Secondary | ICD-10-CM | POA: Diagnosis not present

## 2019-06-14 DIAGNOSIS — F411 Generalized anxiety disorder: Secondary | ICD-10-CM

## 2019-06-14 DIAGNOSIS — I2510 Atherosclerotic heart disease of native coronary artery without angina pectoris: Secondary | ICD-10-CM

## 2019-06-14 DIAGNOSIS — R0602 Shortness of breath: Secondary | ICD-10-CM

## 2019-06-14 NOTE — Chronic Care Management (AMB) (Addendum)
Care Management Note   [name] is a 63 y.o. year old female who is a primary care patient of [name], [name]. The CM team was consulted for assistance with chronic disease management and care coordination.   I reached out to [name] by phone today.     Review of patient status, including review of consultants reports, relevant laboratory and other test results, and collaboration with appropriate care team members and the patient's provider was performed as part of comprehensive patient evaluation and provision of chronic care management services.   Social determinants of health: risk of social isolation; risk of tobacco use; risk of stress; risk of food insecurity    Office Visit from 05/16/2019 in [facility]  PHQ-9 Total Score  22     GAD 7 : Generalized Anxiety Score 05/16/2019 03/08/2019 01/11/2018 05/11/2017  Nervous, Anxious, on Edge 2 1 2 2   Control/stop worrying 3 2 3 2   Worry too much - different things 3 1 3 2   Trouble relaxing 2 1 2 1   Restless 2 1 1 2   Easily annoyed or irritable 1 0 1 1  Afraid - awful might happen 3 1 0 2  Total GAD 7 Score 16 7 12 12   Anxiety Difficulty - Somewhat difficult Somewhat difficult Very difficult   Medications    albuterol (PROVENTIL HFA;VENTOLIN HFA) 108 (90 Base) MCG/ACT inhaler    ALPRAZolam (XANAX) 1 MG tablet    ARIPiprazole (ABILIFY) 2 MG tablet    aspirin 325 MG tablet    atorvastatin (LIPITOR) 80 MG tablet    blood glucose meter kit and supplies KIT    Blood Glucose Monitoring Suppl (BLOOD GLUCOSE MONITOR SYSTEM) w/Device KIT    Cholecalciferol (VITAMIN D3) 5000 units TABS    desvenlafaxine (PRISTIQ) 50 MG 24 hr tablet    fish oil-omega-3 fatty acids 1000 MG capsule    fluticasone furoate-vilanterol (BREO ELLIPTA) 100-25 MCG/INH AEPB    glucose blood (IGLUCOSE TEST STRIPS) test strip    hydrOXYzine (VISTARIL) 25 MG capsule    isosorbide mononitrate (IMDUR) 30 MG 24 hr tablet     Lancets MISC    lisinopril-hydrochlorothiazide (ZESTORETIC) 20-12.5 MG tablet    metFORMIN (GLUCOPHAGE) 500 MG tablet    metoprolol tartrate (LOPRESSOR) 25 MG tablet    pramipexole (MIRAPEX) 0.5 MG tablet    vitamin B-12 (CYANOCOBALAMIN) 1000 MCG tablet    Goals            . Client wants to talk with [name] about depression and decreased motivation of client (pt-stated)     Current Barriers:  . Financial barriers . Social  isolation challenges  Clinical Social Work Clinical Goal(s):  [name] Kitchen [name] and Client to talk in next 30 days about client management of depression symptoms and talk about client motivation challenges.   Interventions: . Previously talked with [name] about [name] support for nursing needs . Previously talked with client about relaxation techniques (talks on phone with friends, uses computer to play games.  . Previously talked about ambulation challenges with client . Talked with client about heating needs of client in her home . Gave client name and phone number for [name] [phone]) . Talked with client about in home care needs of client  . Patient Self Care Activities:  . Takes medications as prescribed . Attends scheduled medical appointments   Plan:   Client to attend scheduled medical appointments  Client to use relaxation techniques of choice to manage  symptoms faced [name] to call client in next 3 weeks to assess psychosocial needs of client.   Initial goal documentation     .     Follow Up Plan:Call client in next 3 weeks to assess the psychosocial needs of client at that time  [name].[name] MSW, [name] Licensed Clinical Social Worker [name] [phone]  "I have reviewed this encounter including the documentation in this note and/or discussed this patient with [name] . I am certifying that I agree with the content of this note as supervising physician." [name], FNP

## 2019-06-14 NOTE — Patient Instructions (Addendum)
Licensed Clinical Social Worker Visit Information  Goals we discussed today:  Goals            . Client wants to talk with [name] about depression and decreased motivation of client (pt-stated)     Current Barriers:  . Financial barriers . Social  isolation challenges  Clinical Social Work Clinical Goal(s):  [name] [name] and Client to talk in next 30 days about client management of depression symptoms and talk about client motivation challenges.   Interventions:  Previously talked with [name] about RNCM support for nursing needs  Previously talked with client about relaxation techniques (talks on phone with friends, uses computer to play games.   Previously talked about ambulation challenges with client  Talked with client about heating needs of client in her home  [place] client name and phone number for [place] [phone])  Talked with client about in home care needs of client  . Patient Self Care Activities:  . Takes medications as prescribed . Attends scheduled medical appointments   Plan:   Client to attend scheduled medical appointments  Client to use relaxation techniques of choice to manage symptoms faced [name] to call client in next 3 weeks to assess psychosocial needs of client.   Initial goal documentation     .      Materials Provided: No  Follow Up Plan: [name] to call client in next 3 weeks to assess the psychosocial needs of client at that time  The patient verbalized understanding of instructions provided today and declined a print copy of patient instruction materials.   [name].Danely Bayliss MSW, [name] Licensed Clinical Social Worker [facility] [phone]

## 2019-06-27 ENCOUNTER — Ambulatory Visit (INDEPENDENT_AMBULATORY_CARE_PROVIDER_SITE_OTHER): Payer: Medicare HMO | Admitting: Licensed Clinical Social Worker

## 2019-06-27 DIAGNOSIS — I10 Essential (primary) hypertension: Secondary | ICD-10-CM

## 2019-06-27 DIAGNOSIS — E1159 Type 2 diabetes mellitus with other circulatory complications: Secondary | ICD-10-CM | POA: Diagnosis not present

## 2019-06-27 DIAGNOSIS — F3342 Major depressive disorder, recurrent, in full remission: Secondary | ICD-10-CM | POA: Diagnosis not present

## 2019-06-27 DIAGNOSIS — R0602 Shortness of breath: Secondary | ICD-10-CM

## 2019-06-27 DIAGNOSIS — I2510 Atherosclerotic heart disease of native coronary artery without angina pectoris: Secondary | ICD-10-CM

## 2019-06-27 DIAGNOSIS — F411 Generalized anxiety disorder: Secondary | ICD-10-CM

## 2019-06-27 DIAGNOSIS — J449 Chronic obstructive pulmonary disease, unspecified: Secondary | ICD-10-CM

## 2019-06-27 DIAGNOSIS — I2583 Coronary atherosclerosis due to lipid rich plaque: Secondary | ICD-10-CM

## 2019-06-27 NOTE — Chronic Care Management (AMB) (Addendum)
Care Management Note   [name] is a 63 y.o. year old female who is a primary care patient of [name], [place]. The CM team was consulted for assistance with chronic disease management and care coordination.   I reached out to [place], daughter, by phone today.   Review of patient status, including review of consultants reports, relevant laboratory and other test results, and collaboration with appropriate care team members and the patient's provider was performed as part of comprehensive patient evaluation and provision of chronic care management services.   Social determinants of health: risk of social isolation; risk of tobacco use; risk of stress; risk of physical inactivity; risk of food insecurity    Office Visit from 05/16/2019 in [place]  PHQ-9 Total Score  22     GAD 7 : Generalized Anxiety Score 05/16/2019 03/08/2019 01/11/2018 05/11/2017  Nervous, Anxious, on Edge _0 Control/stop worrying _1 Worry too much - different things _2 Trouble relaxing _3 Restless _4 Easily annoyed or irritable 1 0 1 1  Afraid - awful might happen 3 1 0 2  Total GAD 7 Score _5 Anxiety Difficulty - Somewhat difficult Somewhat difficult Very difficult   Medications    albuterol (PROVENTIL HFA;VENTOLIN HFA) 108 (90 Base) MCG/ACT inhaler    ALPRAZolam (XANAX) 1 MG tablet    ARIPiprazole (ABILIFY) 2 MG tablet    aspirin 325 MG tablet    atorvastatin (LIPITOR) 80 MG tablet    blood glucose meter kit and supplies KIT    Blood Glucose Monitoring Suppl (BLOOD GLUCOSE MONITOR SYSTEM) w/Device KIT    Cholecalciferol (VITAMIN D3) 5000 units TABS    desvenlafaxine (PRISTIQ) 50 MG 24 hr tablet    fish oil-omega-3 fatty acids 1000 MG capsule    fluticasone furoate-vilanterol (BREO ELLIPTA) 100-25 MCG/INH AEPB    glucose blood (IGLUCOSE TEST STRIPS) test strip    hydrOXYzine (VISTARIL) 25 MG capsule    isosorbide mononitrate (IMDUR) 30 MG 24 hr tablet    Lancets MISC    lisinopril-hydrochlorothiazide (ZESTORETIC) 20-12.5 MG tablet    metFORMIN (GLUCOPHAGE) 500 MG tablet    metoprolol tartrate (LOPRESSOR) 25 MG tablet    pramipexole (MIRAPEX) 0.5 MG tablet    vitamin B-12 (CYANOCOBALAMIN) 1000 MCG tablet    Goals    . Client wants to talk with [name] about depression and decreased motivation of client (pt-stated)     Current Barriers:  . Financial barriers . Social  isolation challenges  Clinical Social Work Clinical Goal(s):  [name] [name] and Client to talk in next 30 days about client management of depression symptoms and talk about client motivation challenges.   Interventions: . Previously spoke with [name] about CCM program . Talked previously with [name] about [facility] support for nursing needs . Previously talked with client about relaxation techniques (talks on phone with friends, uses computer to play games.  . Talked previously with [name] about ambulation challenges with client . Talked previously with client about mental health care history of client . Talked with [name], daughter, about current client needs  . Patient Self Care Activities:  . Takes medications as prescribed . Attends scheduled medical appointments   Plan:   Client to attend scheduled medical appointments  Client to use relaxation techniques of choice to manage symptoms faced [name] to call client in  next 3 weeks to assess psychosocial needs of client.   Initial goal documentation          Follow Up Plan: [name] to call client in next 3 weeks to assess the psychosocial needs of client at that time  [name].Tahj Njoku MSW, [name] Licensed Clinical Social Worker [facility] [phone]  "I have reviewed this encounter including the documentation in this note and/or discussed this patient with [name] . I am certifying that I agree with the content of this  note as supervising physician." [name], FNP

## 2019-06-27 NOTE — Patient Instructions (Addendum)
Licensed Clinical Social Worker Visit Information  Goals we discussed today:  Goals        . Client wants to talk with [name] about depression and decreased motivation of client (pt-stated)     Current Barriers:  . Financial barriers . Social  isolation challenges  Clinical Social Work Clinical Goal(s):  [name] [name] and Client to talk in next 30 days about client management of depression symptoms and talk about client motivation challenges.   Interventions: . Previously spoke with [name] about CCM program . Talked previously with [name] about [facility] support for nursing needs . Talked with client previously about relaxation techniques (talks on phone with friends, uses computer to play games.  . Previously talked with client about ambulation challenges with client . Talked with client previously about mental health care history of client  Talked with [name], [name], about current client needs  . Patient Self Care Activities:  . Takes medications as prescribed . Attends scheduled medical appointments   Plan:   Client to attend scheduled medical appointments  Client to use relaxation techniques of choice to manage symptoms faced [name] to call client in next 3 weeks to assess psychosocial needs of client.   Initial goal documentation     .      Materials Provided: No  Follow Up Plan: [name] to call client in next 3 weeks to assess the psychosocial needs of client at that time  The patient/[name], [name], verbalized understanding of instructions provided today and declined a print copy of patient instruction materials.   [name] MSW, [name] Licensed Clinical Social Worker [facility] [phone]

## 2019-07-19 ENCOUNTER — Ambulatory Visit: Payer: Self-pay | Admitting: Licensed Clinical Social Worker

## 2019-07-19 DIAGNOSIS — F411 Generalized anxiety disorder: Secondary | ICD-10-CM

## 2019-07-19 DIAGNOSIS — I10 Essential (primary) hypertension: Secondary | ICD-10-CM

## 2019-07-19 DIAGNOSIS — J441 Chronic obstructive pulmonary disease with (acute) exacerbation: Secondary | ICD-10-CM

## 2019-07-19 DIAGNOSIS — E785 Hyperlipidemia, unspecified: Secondary | ICD-10-CM

## 2019-07-19 DIAGNOSIS — I2583 Coronary atherosclerosis due to lipid rich plaque: Secondary | ICD-10-CM

## 2019-07-19 DIAGNOSIS — I2510 Atherosclerotic heart disease of native coronary artery without angina pectoris: Secondary | ICD-10-CM

## 2019-07-19 NOTE — Patient Instructions (Addendum)
Licensed Clinical Social Worker Visit Information  Goals we discussed today:  Goals Addressed            This Visit's Progress   . Client wants to talk with [name] about depression and decreased motivation of client (pt-stated)       Current Barriers:  . Financial barriers . Social  isolation challenges in patient with Chronic Diagnoses of GAD, COPD, CAD, HLD and HTN  Clinical Social Work Clinical Goal(s):  [name] [name] and Client to talk in next 30 days about client management of depression symptoms and talk about client motivation challenges.   Interventions:  Previously spoke with [name] about CCM program  Previously talked with [name] about RNCM support for nursing needs  Talked with client previously about relaxation techniques (talks on phone with friends, uses computer to play games.  Previously talked with client about ambulation challenges with client  Previously talked with client about mental health care history of client  . Patient Self Care Activities:  . Takes medications as prescribed . Attends scheduled medical appointments   Plan:   Client to attend scheduled medical appointments  Client to use relaxation techniques of choice to manage symptoms faced [name] to call client in next 4 weeks to assess psychosocial needs of client.   Initial goal documentation         Materials Provided: No  Follow Up Plan: [name] to call client in next 4 weeks to assess the psychosocial needs of client at that time  The patient verbalized understanding of instructions provided today and declined a print copy of patient instruction materials.   [name].[name] MSW, [name] Licensed Clinical Social Worker [facility] [phone]

## 2019-07-19 NOTE — Chronic Care Management (AMB) (Addendum)
Care Management Note   [name] is a 63 y.o. year old female who is a primary care patient of [name], [place]. The CM team was consulted for assistance with chronic disease management and care coordination.   I reached out to [name] by phone today.   Review of patient status, including review of consultants reports, relevant laboratory and other test results, and collaboration with appropriate care team members and the patient's provider was performed as part of comprehensive patient evaluation and provision of chronic care management services.   Social determinants of health: risk of tobacco use; risk of depression; risk of food insecurity    Office Visit from 05/16/2019 in [place]  PHQ-9 Total Score  22       GAD 7 : Generalized Anxiety Score 05/16/2019 03/08/2019 01/11/2018 05/11/2017  Nervous, Anxious, on Edge 2 1 2 2   Control/stop worrying 3 2 3 2   Worry too much - different things 3 1 3 2   Trouble relaxing 2 1 2 1   Restless 2 1 1 2   Easily annoyed or irritable 1 0 1 1  Afraid - awful might happen 3 1 0 2  Total GAD 7 Score 16 7 12 12   Anxiety Difficulty - Somewhat difficult Somewhat difficult Very difficult    Medications    albuterol (PROVENTIL HFA;VENTOLIN HFA) 108 (90 Base) MCG/ACT inhaler ALPRAZolam (XANAX) 1 MG tablet ARIPiprazole (ABILIFY) 2 MG tablet aspirin 325 MG tablet atorvastatin (LIPITOR) 80 MG tablet blood glucose meter kit and supplies KIT Blood Glucose Monitoring Suppl (BLOOD GLUCOSE MONITOR SYSTEM) w/Device KIT Cholecalciferol (VITAMIN D3) 5000 units TABS desvenlafaxine (PRISTIQ) 50 MG 24 hr tablet fish oil-omega-3 fatty acids 1000 MG capsule fluticasone furoate-vilanterol (BREO ELLIPTA) 100-25 MCG/INH AEPB glucose blood (IGLUCOSE TEST STRIPS) test strip hydrOXYzine (VISTARIL) 25 MG capsule isosorbide mononitrate (IMDUR) 30 MG 24 hr tablet Lancets MISC lisinopril-hydrochlorothiazide (ZESTORETIC)  20-12.5 MG tablet metFORMIN (GLUCOPHAGE) 500 MG tablet metoprolol tartrate (LOPRESSOR) 25 MG tablet pramipexole (MIRAPEX) 0.5 MG tablet vitamin B-12 (CYANOCOBALAMIN) 1000 MCG tablet  Goals Addressed             This Visit's Progress    Client wants to talk with [name] about depression and decreased motivation of client (pt-stated)       Current Barriers:  Financial barriers Social  isolation challenges in patient with Chronic Diagnoses of GAD, COPD, CAD, HLD and HTN  Clinical Social Work Clinical Goal(s):  [name] and Client to talk in next 30 days about client management of depression symptoms and talk about client motivation challenges.   Interventions: Previously spoke with [name] about CCM program Previously talked with [name] about RNCM support for nursing needs Talked with client previously about relaxation techniques (talks on phone with friends, uses computer to play games.  Previously talked with client about ambulation challenges with client Previously talked with client about mental health care history of client  Patient Self Care Activities:  Takes medications as prescribed Attends scheduled medical appointments   Plan:   Client to attend scheduled medical appointments  Client to use relaxation techniques of choice to manage symptoms faced [name] to call client in next 4 weeks to assess psychosocial needs of client.   Initial goal documentation        Follow Up Plan:  [name] to call client in next 4 weeks to assess the psychosocial needs of client at that time  [name].Jennye Runquist MSW, [name] Licensed Clinical Social Worker [facility]  [phone]  "I have reviewed this encounter including the documentation in this note and/or discussed this patient with [name] . I am certifying that I agree with the content of this note as supervising physician." [place], FNP

## 2019-08-07 ENCOUNTER — Ambulatory Visit (INDEPENDENT_AMBULATORY_CARE_PROVIDER_SITE_OTHER): Payer: Medicare HMO | Admitting: Nurse Practitioner

## 2019-08-07 ENCOUNTER — Other Ambulatory Visit: Payer: Self-pay

## 2019-08-07 ENCOUNTER — Encounter: Payer: Self-pay | Admitting: Nurse Practitioner

## 2019-08-07 DIAGNOSIS — E559 Vitamin D deficiency, unspecified: Secondary | ICD-10-CM | POA: Diagnosis not present

## 2019-08-07 DIAGNOSIS — F3342 Major depressive disorder, recurrent, in full remission: Secondary | ICD-10-CM

## 2019-08-07 DIAGNOSIS — G2581 Restless legs syndrome: Secondary | ICD-10-CM

## 2019-08-07 DIAGNOSIS — I10 Essential (primary) hypertension: Secondary | ICD-10-CM

## 2019-08-07 DIAGNOSIS — I2510 Atherosclerotic heart disease of native coronary artery without angina pectoris: Secondary | ICD-10-CM | POA: Diagnosis not present

## 2019-08-07 DIAGNOSIS — I6523 Occlusion and stenosis of bilateral carotid arteries: Secondary | ICD-10-CM

## 2019-08-07 DIAGNOSIS — J449 Chronic obstructive pulmonary disease, unspecified: Secondary | ICD-10-CM | POA: Diagnosis not present

## 2019-08-07 DIAGNOSIS — E7800 Pure hypercholesterolemia, unspecified: Secondary | ICD-10-CM

## 2019-08-07 DIAGNOSIS — E785 Hyperlipidemia, unspecified: Secondary | ICD-10-CM

## 2019-08-07 DIAGNOSIS — F411 Generalized anxiety disorder: Secondary | ICD-10-CM

## 2019-08-07 DIAGNOSIS — I2583 Coronary atherosclerosis due to lipid rich plaque: Secondary | ICD-10-CM

## 2019-08-07 DIAGNOSIS — E1159 Type 2 diabetes mellitus with other circulatory complications: Secondary | ICD-10-CM | POA: Diagnosis not present

## 2019-08-07 DIAGNOSIS — R21 Rash and other nonspecific skin eruption: Secondary | ICD-10-CM

## 2019-08-07 MED ORDER — HYDROXYZINE PAMOATE 25 MG PO CAPS: 25.0000 mg | ORAL_CAPSULE | Freq: Three times a day (TID) | ORAL | 0 refills | Status: DC | PRN

## 2019-08-07 MED ORDER — METFORMIN HCL 500 MG PO TABS: 500.0000 mg | ORAL_TABLET | Freq: Two times a day (BID) | ORAL | 1 refills | Status: DC

## 2019-08-07 MED ORDER — ESCITALOPRAM OXALATE 20 MG PO TABS: 20.0000 mg | ORAL_TABLET | Freq: Every day | ORAL | 1 refills | Status: DC

## 2019-08-07 MED ORDER — BREO ELLIPTA 100-25 MCG/INH IN AEPB: 1.0000 | INHALATION_SPRAY | Freq: Every day | RESPIRATORY_TRACT | 1 refills | Status: DC

## 2019-08-07 MED ORDER — ISOSORBIDE MONONITRATE ER 30 MG PO TB24: 15.0000 mg | ORAL_TABLET | Freq: Every day | ORAL | 1 refills | Status: DC

## 2019-08-07 MED ORDER — PRAMIPEXOLE DIHYDROCHLORIDE 0.5 MG PO TABS: 0.5000 mg | ORAL_TABLET | Freq: Two times a day (BID) | ORAL | 1 refills | Status: DC

## 2019-08-07 MED ORDER — ARIPIPRAZOLE 2 MG PO TABS: 2.0000 mg | ORAL_TABLET | Freq: Every day | ORAL | 1 refills | Status: DC

## 2019-08-07 MED ORDER — ATORVASTATIN CALCIUM 80 MG PO TABS: 80.0000 mg | ORAL_TABLET | Freq: Every day | ORAL | 1 refills | Status: DC

## 2019-08-07 MED ORDER — ALPRAZOLAM 1 MG PO TABS: 1.0000 mg | ORAL_TABLET | Freq: Three times a day (TID) | ORAL | 2 refills | Status: DC | PRN

## 2019-08-07 MED ORDER — LISINOPRIL-HYDROCHLOROTHIAZIDE 20-12.5 MG PO TABS: 2.0000 | ORAL_TABLET | Freq: Every day | ORAL | 1 refills | Status: DC

## 2019-08-07 MED ORDER — METOPROLOL TARTRATE 25 MG PO TABS: 25.0000 mg | ORAL_TABLET | Freq: Two times a day (BID) | ORAL | 1 refills | Status: DC

## 2019-08-07 NOTE — Progress Notes (Signed)
Virtual Visit via telephone Note Due to COVID-19 pandemic this visit was conducted virtually. This visit type was conducted due to national recommendations for restrictions regarding the COVID-19 Pandemic (e.g. social distancing, sheltering in place) in an effort to limit this patient's exposure and mitigate transmission in our community. All issues noted in this document were discussed and addressed.  A physical exam was not performed with this format.  I connected with [name] on 08/07/19 at 1:50 by telephone and verified that I am speaking with the correct person using two identifiers. [name] is currently located at home and no one is currently with her during visit. The provider, [name], FNP is located in their office at time of visit.  I discussed the limitations, risks, security and privacy concerns of performing an evaluation and management service by telephone and the availability of in person appointments. I also discussed with the patient that there may be a patient responsible charge related to this service. The patient expressed understanding and agreed to proceed.   History and Present Illness:  Chief Complaint: Medical Management of Chronic Issues    HPI:  1. Essential hypertension No c/o chest pain, sob or headache. Does not check blood pressure at home. BP Readings from Last 3 Encounters:  05/16/19 (!) 175/86  04/25/19 (!) 155/81  08/12/18 (!) 156/76     2. Pure hypercholesterolemia Does not watch diet and does no exercise. Lab Results  Component Value Date   CHOL 145 05/16/2019   HDL 35 (L) 05/16/2019   LDLCALC 75 05/16/2019   LDLDIRECT 100.0 05/16/2007   TRIG 212 (H) 05/16/2019   CHOLHDL 4.1 05/16/2019     3. Coronary artery disease due to lipid rich plaque Has not seen cardiology in years. Does not eel she neds to at this time.  4. Bilateral carotid artery stenosis No signs of stroke symptoms.  5. Chronic obstructive  pulmonary disease, unspecified COPD type ([place]) Is on BREO and is doing well . Denied wheezing an dcough  6. Type 2 diabetes mellitus with other circulatory complication, without long-term current use of insulin (HCC) She has not been checking her blood sugar. Denies any symptoms of lw blood sugars. Lab Results  Component Value Date   HGBA1C 6.8 05/16/2019     7. Restless leg syndrome Is on mirapex and is doing well to keep legs calm at night  8. Recurrent major depressive disorder, in full remission ([place]) Was put on pristiq at patient request at last visit. She says is not doing well. She feels extremely depressed. She is also on abilify which she says is not helping at all. lexapro seemed to help better and would like to try that with the abilify. Depression screen [facility] 2/9 05/16/2019 04/25/2019 03/13/2019  Decreased Interest 3 0 2  Down, Depressed, Hopeless 3 0 2  PHQ - 2 Score 6 0 4  Altered sleeping 3 - 2  Tired, decreased energy 3 - 2  Change in appetite 2 - 3  Feeling bad or failure about yourself  3 - 1  Trouble concentrating 3 - 1  Moving slowly or fidgety/restless 1 - 0  Suicidal thoughts 1 - 0  PHQ-9 Score 22 - 13  Difficult doing work/chores - - Somewhat difficult  Some recent data might be hidden     9. Vitamin D deficiency Takes a dialy vitamin d supplement  10. Generalized anxiety disorder Takes xanax BID-TID- when really anxious she takes TID GAD 7 :  Generalized Anxiety Score 08/07/2019 05/16/2019 03/08/2019 01/11/2018  Nervous, Anxious, on Edge 3 2 1 2   Control/stop worrying 3 3 2 3   Worry too much - different things 3 3 1 3   Trouble relaxing 2 2 1 2   Restless 1 2 1 1   Easily annoyed or irritable 3 1 0 1  Afraid - awful might happen 1 3 1  0  Total GAD 7 Score 16 16 7 12   Anxiety Difficulty Very difficult - Somewhat difficult Somewhat difficult      11. Morbid obesity ([place]) Weight is up over 10lbs Wt Readings from Last 3 Encounters:  05/16/19 220 lb  (99.8 kg)  04/25/19 213 lb (96.6 kg)  08/12/18 209 lb (94.8 kg)   BMI Readings from Last 3 Encounters:  05/16/19 38.97 kg/m  04/25/19 37.73 kg/m  08/12/18 37.02 kg/m       Outpatient Encounter Medications as of 08/07/2019  Medication Sig  . albuterol (PROVENTIL HFA;VENTOLIN HFA) 108 (90 Base) MCG/ACT inhaler Inhale 2 puffs into the lungs every 6 (six) hours as needed for wheezing or shortness of breath.  . ALPRAZolam (XANAX) 1 MG tablet Take 1 tablet (1 mg total) by mouth 3 (three) times daily as needed for anxiety.  . ARIPiprazole (ABILIFY) 2 MG tablet Take 1 tablet (2 mg total) by mouth daily.  [name] aspirin 325 MG tablet Take 325 mg by mouth daily.    [name] atorvastatin (LIPITOR) 80 MG tablet Take 1 tablet (80 mg total) by mouth daily. (Needs to be seen before next refill)  . blood glucose meter kit and supplies KIT Dispense based on patient and insurance preference. Use up to four times daily as directed. (FOR ICD-9 250.00, 250.01).  . Blood Glucose Monitoring Suppl (BLOOD GLUCOSE MONITOR SYSTEM) w/Device KIT 1 Device by Does not apply route 2 (two) times daily.  . Cholecalciferol (VITAMIN D3) 5000 units TABS Take 1 tablet by mouth daily.  [name] desvenlafaxine (PRISTIQ) 50 MG 24 hr tablet Take 1 tablet (50 mg total) by mouth daily.  . fish oil-omega-3 fatty acids 1000 MG capsule Take 2 g by mouth 2 (two) times daily.   . fluticasone furoate-vilanterol (BREO ELLIPTA) 100-25 MCG/INH AEPB Inhale 1 puff into the lungs daily.  [name] glucose blood (IGLUCOSE TEST STRIPS) test strip Use to check blood sugars twice daily Dx E11.9  . hydrOXYzine (VISTARIL) 25 MG capsule Take 1 capsule (25 mg total) by mouth 3 (three) times daily as needed.  . isosorbide mononitrate (IMDUR) 30 MG 24 hr tablet Take 0.5 tablets (15 mg total) by mouth daily.  . Lancets MISC Use to check blood sugars twice daily ICD 10 E11.9  . lisinopril-hydrochlorothiazide (ZESTORETIC) 20-12.5 MG tablet Take 2 tablets by mouth daily.  .  metFORMIN (GLUCOPHAGE) 500 MG tablet Take 1 tablet (500 mg total) by mouth 2 (two) times daily with a meal.  . metoprolol tartrate (LOPRESSOR) 25 MG tablet Take 1 tablet (25 mg total) by mouth 2 (two) times daily.  . pramipexole (MIRAPEX) 0.5 MG tablet Take 1 tablet (0.5 mg total) by mouth 2 (two) times daily.  . vitamin B-12 (CYANOCOBALAMIN) 1000 MCG tablet Take 1,000 mcg by mouth daily.     Past Surgical History:  Procedure Laterality Date  . AXILLARY LYMPH NODE DISSECTION  07/31/2011   Procedure: AXILLARY LYMPH NODE DISSECTION;  Surgeon: [name], MD;  Location: [facility];  Service: General;  Laterality: Left;  left axillary sentinal node biopsy  . BREAST SURGERY    .  CARDIAC CATHETERIZATION  11/02/2006; 06/03/2011  . CESAREAN SECTION  1987  . CORONARY STENT PLACEMENT  11/02/2006  . ELBOW SURGERY     right  . MASTECTOMY PARTIAL / LUMPECTOMY  06/30/2011   left    Family History  Problem Relation Age of Onset  . Heart disease Mother   . Heart disease Father   . Cancer Maternal Aunt        pt unaware of what kind  . Cancer Maternal Grandmother        ovarian  . Heart attack Sister   . Bipolar disorder Sister     New complaints: Nothing other then her depression stated above  Social history: Lives by herself- family checks on her a couple times a week  Controlled substance contract: 05/17/19    Review of Systems  Constitutional: Negative for diaphoresis and weight loss.  Eyes: Negative for blurred vision, double vision and pain.  Respiratory: Negative for shortness of breath.   Cardiovascular: Negative for chest pain, palpitations, orthopnea and leg swelling.  Gastrointestinal: Negative for abdominal pain.  Skin: Negative for rash.  Neurological: Negative for dizziness, sensory change, loss of consciousness, weakness and headaches.  Endo/Heme/Allergies: Negative for polydipsia. Does not bruise/bleed easily.  Psychiatric/Behavioral: Positive for  depression. Negative for memory loss and suicidal ideas. The patient is nervous/anxious. The patient does not have insomnia.   All other systems reviewed and are negative.    Observations/Objective: Alert and oriented- answers all questions appropriately moderate distress Says she cannot go iut if the house because eof her depression   Assessment and Plan: [name] comes in today with chief complaint of Medical Management of Chronic Issues   Diagnosis and orders addressed:  1. Essential hypertension Low sodium diet - lisinopril-hydrochlorothiazide (ZESTORETIC) 20-12.5 MG tablet; Take 2 tablets by mouth daily.  Dispense: 180 tablet; Refill: 1  2. Pure hypercholesterolemia Low fat diet  3. Coronary artery disease due to lipid rich plaque Refuses to see cardiology  4. Bilateral carotid artery stenosis  5. Chronic obstructive pulmonary disease, unspecified COPD type (HCC) - fluticasone furoate-vilanterol (BREO ELLIPTA) 100-25 MCG/INH AEPB; Inhale 1 puff into the lungs daily.  Dispense: 180 each; Refill: 1  6. Type 2 diabetes mellitus with other circulatory complication, without long-term current use of insulin (HCC) Carb counting encouraged Will check labs at next face to face visit - metFORMIN (GLUCOPHAGE) 500 MG tablet; Take 1 tablet (500 mg total) by mouth 2 (two) times daily with a meal.  Dispense: 180 tablet; Refill: 1  7. Restless leg syndrome Keep legs warm at night - pramipexole (MIRAPEX) 0.5 MG tablet; Take 1 tablet (0.5 mg total) by mouth 2 (two) times daily.  Dispense: 180 tablet; Refill: 1  8. Recurrent major depressive disorder, in full remission ([place]) Stress management Stop pristiq Start back on lexapro as rx - escitalopram (LEXAPRO) 20 MG tablet; Take 1 tablet (20 mg total) by mouth daily.  Dispense: 90 tablet; Refill: 1 - ARIPiprazole (ABILIFY) 2 MG tablet; Take 1 tablet (2 mg total) by mouth daily.  Dispense: 90 tablet; Refill: 1  9. Vitamin D  deficiency Daily vitamind deficiency  10. Generalized anxiety disorder - ALPRAZolam (XANAX) 1 MG tablet; Take 1 tablet (1 mg total) by mouth 3 (three) times daily as needed for anxiety.  Dispense: 90 tablet; Refill: 2  11. Morbid obesity ([place]) Discussed diet and exercise for person with BMI >25 Will recheck weight in 3-6 months  12. Atherosclerosis of native coronary artery without angina  pectoris, unspecified whether native or transplanted heart - isosorbide mononitrate (IMDUR) 30 MG 24 hr tablet; Take 0.5 tablets (15 mg total) by mouth daily.  Dispense: 90 tablet; Refill: 1  13. Hyperlipidemia, unspecified hyperlipidemia type Low fat diet - atorvastatin (LIPITOR) 80 MG tablet; Take 1 tablet (80 mg total) by mouth daily. (Needs to be seen before next refill)  Dispense: 90 tablet; Refill: 1 - metoprolol tartrate (LOPRESSOR) 25 MG tablet; Take 1 tablet (25 mg total) by mouth 2 (two) times daily.  Dispense: 180 tablet; Refill: 1  14. Rash and nonspecific skin eruption - hydrOXYzine (VISTARIL) 25 MG capsule; Take 1 capsule (25 mg total) by mouth 3 (three) times daily as needed.  Dispense: 30 capsule; Refill: 0   Labs pending Health Maintenance reviewed Diet and exercise encouraged  Follow up plan: 1 month     I discussed the assessment and treatment plan with the patient. The patient was provided an opportunity to ask questions and all were answered. The patient agreed with the plan and demonstrated an understanding of the instructions.   The patient was advised to call back or seek an in-person evaluation if the symptoms worsen or if the condition fails to improve as anticipated.  The above assessment and management plan was discussed with the patient. The patient verbalized understanding of and has agreed to the management plan. Patient is aware to call the clinic if symptoms persist or worsen. Patient is aware when to return to the clinic for a follow-up visit. Patient educated on  when it is appropriate to go to the emergency department.   Time call ended:  2:07  I provided 17 minutes of non-face-to-face time during this encounter.    [name], FNP

## 2019-08-16 ENCOUNTER — Ambulatory Visit (INDEPENDENT_AMBULATORY_CARE_PROVIDER_SITE_OTHER): Payer: Medicare HMO | Admitting: Licensed Clinical Social Worker

## 2019-08-16 DIAGNOSIS — I2583 Coronary atherosclerosis due to lipid rich plaque: Secondary | ICD-10-CM | POA: Diagnosis not present

## 2019-08-16 DIAGNOSIS — I10 Essential (primary) hypertension: Secondary | ICD-10-CM | POA: Diagnosis not present

## 2019-08-16 DIAGNOSIS — J441 Chronic obstructive pulmonary disease with (acute) exacerbation: Secondary | ICD-10-CM

## 2019-08-16 DIAGNOSIS — F411 Generalized anxiety disorder: Secondary | ICD-10-CM

## 2019-08-16 DIAGNOSIS — I2510 Atherosclerotic heart disease of native coronary artery without angina pectoris: Secondary | ICD-10-CM | POA: Diagnosis not present

## 2019-08-16 DIAGNOSIS — E7800 Pure hypercholesterolemia, unspecified: Secondary | ICD-10-CM | POA: Diagnosis not present

## 2019-08-16 NOTE — Chronic Care Management (AMB) (Signed)
  Care Management Note   [name] is a 64 y.o. year old female who is a primary care patient of [name], [place]. The CM team was consulted for assistance with chronic disease management and care coordination.   I reached out to [name] by phone today.   Review of patient status, including review of consultants reports, relevant laboratory and other test results, and collaboration with appropriate care team members and the patient's provider was performed as part of comprehensive patient evaluation and provision of chronic care management services.   Social determinants of health: risk of tobacco use; risk of depression; risk of food insecurity    Office Visit from 08/07/2019 in [facility]  PHQ-9 Total Score  22     GAD 7 : Generalized Anxiety Score 08/07/2019 05/16/2019 03/08/2019 01/11/2018  Nervous, Anxious, on Edge '3 2 1 2  '$ Control/stop worrying '3 3 2 3  '$ Worry too much - different things '3 3 1 3  '$ Trouble relaxing '2 2 1 2  '$ Restless '1 2 1 1  '$ Easily annoyed or irritable 3 1 0 1  Afraid - awful might happen '1 3 1 '$ 0  Total GAD 7 Score '16 16 7 12  '$ Anxiety Difficulty Very difficult - Somewhat difficult Somewhat difficult   Medications   albuterol (PROVENTIL HFA;VENTOLIN HFA) 108 (90 Base) MCG/ACT inhaler ALPRAZolam (XANAX) 1 MG tablet ARIPiprazole (ABILIFY) 2 MG tablet aspirin 325 MG tablet atorvastatin (LIPITOR) 80 MG tablet blood glucose meter kit and supplies KIT Blood Glucose Monitoring Suppl (BLOOD GLUCOSE MONITOR SYSTEM) w/Device KIT Cholecalciferol (VITAMIN D3) 5000 units TABS escitalopram (LEXAPRO) 20 MG tablet fish oil-omega-3 fatty acids 1000 MG capsule fluticasone furoate-vilanterol (BREO ELLIPTA) 100-25 MCG/INH AEPB glucose blood (IGLUCOSE TEST STRIPS) test strip hydrOXYzine (VISTARIL) 25 MG capsule isosorbide mononitrate (IMDUR) 30 MG 24 hr tablet Lancets MISC lisinopril-hydrochlorothiazide (ZESTORETIC) 20-12.5 MG  tablet metFORMIN (GLUCOPHAGE) 500 MG tablet metoprolol tartrate (LOPRESSOR) 25 MG tablet pramipexole (MIRAPEX) 0.5 MG tablet vitamin B-12 (CYANOCOBALAMIN) 1000 MCG tablet  Goals    .     [name] Client wants to talk with [name] about depression and decreased motivation of client (pt-stated)     Current Barriers:  . Financial barriers . Social  isolation challenges in patient with Chronic Diagnoses of GAD, COPD, CAD, HLD and HTN  Clinical Social Work Clinical Goal(s):  [name] [name] and Client to talk in next 30 days about client management of depression symptoms and talk about client motivation challenges.   Interventions: . Previously spoke with [name] about CCM program . Talked previously with [name] about [facility] support for nursing needs . Talked previously with client about relaxation techniques (talks on phone with friends, uses computer to play games.  . Previously talked with client about ambulation challenges with client . Previously talked with client about mental health care history of client  . Patient Self Care Activities:  . Takes medications as prescribed . Attends scheduled medical appointments   Plan:   Client to attend scheduled medical appointments  Client to use relaxation techniques of choice to manage symptoms faced [name] to call client in next 4 weeks to assess psychosocial needs of client.   Initial goal documentation     .    Follow Up Plan: [name] to call client in next 4 weeks to assess the psychosocial needs of client at that time  [name].Gavon Majano MSW, [name] Licensed Clinical Social Worker [facility] Care Management [phone]

## 2019-08-16 NOTE — Patient Instructions (Addendum)
Licensed Clinical Social Worker Visit Information  Goals we discussed today:  \ Goals        . Client wants to talk with [name] about depression and decreased motivation of client (pt-stated)     Current Barriers:  . Financial barriers . Social  isolation challenges in patient with Chronic Diagnoses of GAD, COPD, CAD, HLD and HTN  Clinical Social Work Clinical Goal(s):  [name] [name] and Client to talk in next 30 days about client management of depression symptoms and talk about client motivation challenges.   Interventions:  Previously spoke with [name] about CCM program  Talked previously with [name] about RNCM support for nursing needs  Talked previously with client about relaxation techniques (talks on phone with friends, uses computer to play games.   Previously talked with client about ambulation challenges with client  Previously talked with client about mental health care history of client  . Patient Self Care Activities:  . Takes medications as prescribed . Attends scheduled medical appointments   Plan:   Client to attend scheduled medical appointments  Client to use relaxation techniques of choice to manage symptoms faced [name] to call client in next 4 weeks to assess psychosocial needs of client.   Initial goal documentation     .      Materials Provided: No  Follow Up Plan: [name] to call client in next 4 weeks to assess the psychosocial needs of client at that time  The patient verbalized understanding of instructions provided today and declined a print copy of patient instruction materials.   [name].Lynsee Wands MSW, [name] Licensed Clinical Social Worker [facility] Care Management [phone]

## 2019-08-17 ENCOUNTER — Ambulatory Visit: Payer: Self-pay | Admitting: Nurse Practitioner

## 2019-09-04 ENCOUNTER — Other Ambulatory Visit: Payer: Self-pay

## 2019-09-05 ENCOUNTER — Ambulatory Visit (INDEPENDENT_AMBULATORY_CARE_PROVIDER_SITE_OTHER): Payer: Medicare HMO | Admitting: Nurse Practitioner

## 2019-09-05 ENCOUNTER — Encounter: Payer: Self-pay | Admitting: Nurse Practitioner

## 2019-09-05 DIAGNOSIS — E785 Hyperlipidemia, unspecified: Secondary | ICD-10-CM

## 2019-09-05 DIAGNOSIS — I10 Essential (primary) hypertension: Secondary | ICD-10-CM | POA: Diagnosis not present

## 2019-09-05 DIAGNOSIS — J449 Chronic obstructive pulmonary disease, unspecified: Secondary | ICD-10-CM

## 2019-09-05 DIAGNOSIS — E7800 Pure hypercholesterolemia, unspecified: Secondary | ICD-10-CM

## 2019-09-05 DIAGNOSIS — F411 Generalized anxiety disorder: Secondary | ICD-10-CM | POA: Diagnosis not present

## 2019-09-05 DIAGNOSIS — E559 Vitamin D deficiency, unspecified: Secondary | ICD-10-CM | POA: Diagnosis not present

## 2019-09-05 DIAGNOSIS — I2510 Atherosclerotic heart disease of native coronary artery without angina pectoris: Secondary | ICD-10-CM | POA: Diagnosis not present

## 2019-09-05 DIAGNOSIS — G2581 Restless legs syndrome: Secondary | ICD-10-CM | POA: Diagnosis not present

## 2019-09-05 DIAGNOSIS — I2583 Coronary atherosclerosis due to lipid rich plaque: Secondary | ICD-10-CM

## 2019-09-05 DIAGNOSIS — F3342 Major depressive disorder, recurrent, in full remission: Secondary | ICD-10-CM

## 2019-09-05 DIAGNOSIS — E1159 Type 2 diabetes mellitus with other circulatory complications: Secondary | ICD-10-CM | POA: Diagnosis not present

## 2019-09-05 MED ORDER — ARIPIPRAZOLE 2 MG PO TABS: 2.0000 mg | ORAL_TABLET | Freq: Every day | ORAL | 1 refills | Status: DC

## 2019-09-05 MED ORDER — METFORMIN HCL 500 MG PO TABS: 500.0000 mg | ORAL_TABLET | Freq: Two times a day (BID) | ORAL | 1 refills | Status: DC

## 2019-09-05 MED ORDER — ISOSORBIDE MONONITRATE ER 30 MG PO TB24: 15.0000 mg | ORAL_TABLET | Freq: Every day | ORAL | 1 refills | Status: DC

## 2019-09-05 MED ORDER — LISINOPRIL-HYDROCHLOROTHIAZIDE 20-12.5 MG PO TABS: 2.0000 | ORAL_TABLET | Freq: Every day | ORAL | 1 refills | Status: DC

## 2019-09-05 MED ORDER — PRAMIPEXOLE DIHYDROCHLORIDE 0.5 MG PO TABS: 0.5000 mg | ORAL_TABLET | Freq: Two times a day (BID) | ORAL | 1 refills | Status: DC

## 2019-09-05 MED ORDER — ALPRAZOLAM 1 MG PO TABS: 1.0000 mg | ORAL_TABLET | Freq: Three times a day (TID) | ORAL | 2 refills | Status: DC | PRN

## 2019-09-05 MED ORDER — METOPROLOL TARTRATE 25 MG PO TABS: 25.0000 mg | ORAL_TABLET | Freq: Two times a day (BID) | ORAL | 1 refills | Status: DC

## 2019-09-05 MED ORDER — ESCITALOPRAM OXALATE 20 MG PO TABS: 20.0000 mg | ORAL_TABLET | Freq: Every day | ORAL | 1 refills | Status: DC

## 2019-09-05 MED ORDER — ATORVASTATIN CALCIUM 80 MG PO TABS: 80.0000 mg | ORAL_TABLET | Freq: Every day | ORAL | 1 refills | Status: DC

## 2019-09-05 MED ORDER — BREO ELLIPTA 100-25 MCG/INH IN AEPB: 1.0000 | INHALATION_SPRAY | Freq: Every day | RESPIRATORY_TRACT | 1 refills | Status: DC

## 2019-09-05 NOTE — Progress Notes (Signed)
Virtual Visit via telephone Note Due to COVID-19 pandemic this visit was conducted virtually. This visit type was conducted due to national recommendations for restrictions regarding the COVID-19 Pandemic (e.g. social distancing, sheltering in place) in an effort to limit this patient's exposure and mitigate transmission in our community. All issues noted in this document were discussed and addressed.  A physical exam was not performed with this format.  I connected with [name] on 09/05/19 at 2:05 by telephone and verified that I am speaking with the correct person using two identifiers. [name] is currently located at home and no one is currently with  her during visit. The provider, [name], FNP is located in their office at time of visit.  I discussed the limitations, risks, security and privacy concerns of performing an evaluation and management service by telephone and the availability of in person appointments. I also discussed with the patient that there may be a patient responsible charge related to this service. The patient expressed understanding and agreed to proceed.   History and Present Illness:   Chief Complaint: Medical Management of Chronic Issues    HPI:  1. Essential hypertension No c/o chest pain, sob or headache. Does not check blood pressure at home. Has only been taking 1 isinopril 10/12.5 and she is suppose  To be on 2 tablets. BP Readings from Last 3 Encounters:  05/16/19 (!) 175/86  04/25/19 (!) 155/81  08/12/18 (!) 156/76     2. Type 2 diabetes mellitus with other circulatory complication, without long-term current use of insulin (HCC) Fasting blood sugars are running below 130 consistenmtly. Currently it is 100. She denies any low blood sugars. Lab Results  Component Value Date   HGBA1C 6.8 05/16/2019     3. Coronary artery disease due to lipid rich plaque Has not seen cardiologist lately. She will make an appointment.  Still taking Imdur daily.  4. Chronic obstructive pulmonary disease, unspecified COPD type ([place]) Using BREO daily and is doing good. She has a slight cough. Occasional DOE but  No worse then usual.  5. Pure hypercholesterolemia Not watching diet and doing very little exercise. Lab Results  Component Value Date   CHOL 145 05/16/2019   HDL 35 (L) 05/16/2019   LDLCALC 75 05/16/2019   LDLDIRECT 100.0 05/16/2007   TRIG 212 (H) 05/16/2019   CHOLHDL 4.1 05/16/2019     6. Generalized anxiety disorder Takes xanax 3x a day. We have tried weaning her down and hav enot been successful. GAD 7 : Generalized Anxiety Score 09/05/2019 08/07/2019 05/16/2019 03/08/2019  Nervous, Anxious, on Edge _0 Control/stop worrying _1 Worry too much - different things _2 Trouble relaxing 0 _3 Restless 0 _4 Easily annoyed or irritable 0 3 1 0  Afraid - awful might happen 0 _5 Total GAD 7 Score _6 Anxiety Difficulty Not difficult at all Very difficult - Somewhat difficult      7. Vitamin D deficiency Takes vitamin d daily  8. Restless leg syndrome Takes mirapex nightly for her RLS. Works well at  night to help her sleep  9. Recurrent major depressive disorder, in full remission ([place]) Is on lexapro and abilify and is doing well. Depression screen [facility] 2/9 08/07/2019 05/16/2019 04/25/2019  Decreased Interest 3 3 0  Down, Depressed, Hopeless 3 3 0  PHQ - 2 Score  6 6 0  Altered sleeping 3 3 -  Tired, decreased energy 3 3 -  Change in appetite 3 2 -  Feeling bad or failure about yourself  2 3 -  Trouble concentrating 3 3 -  Moving slowly or fidgety/restless 2 1 -  Suicidal thoughts 0 1 -  PHQ-9 Score 22 22 -  Difficult doing work/chores Very difficult - -  Some recent data might be hidden     10. Morbid obesity ([place]) No recent weight chnages Wt Readings from Last 3 Encounters:  05/16/19 220 lb (99.8 kg)  04/25/19 213 lb (96.6 kg)  08/12/18 209 lb (94.8 kg)    BMI Readings from Last 3 Encounters:  05/16/19 38.97 kg/m  04/25/19 37.73 kg/m  08/12/18 37.02 kg/m       Outpatient Encounter Medications as of 09/05/2019  Medication Sig  . albuterol (PROVENTIL HFA;VENTOLIN HFA) 108 (90 Base) MCG/ACT inhaler Inhale 2 puffs into the lungs every 6 (six) hours as needed for wheezing or shortness of breath.  . ALPRAZolam (XANAX) 1 MG tablet Take 1 tablet (1 mg total) by mouth 3 (three) times daily as needed for anxiety.  . ARIPiprazole (ABILIFY) 2 MG tablet Take 1 tablet (2 mg total) by mouth daily.  [name] aspirin 325 MG tablet Take 325 mg by mouth daily.    [name] atorvastatin (LIPITOR) 80 MG tablet Take 1 tablet (80 mg total) by mouth daily. (Needs to be seen before next refill)  . blood glucose meter kit and supplies KIT Dispense based on patient and insurance preference. Use up to four times daily as directed. (FOR ICD-9 250.00, 250.01).  . Blood Glucose Monitoring Suppl (BLOOD GLUCOSE MONITOR SYSTEM) w/Device KIT 1 Device by Does not apply route 2 (two) times daily.  . Cholecalciferol (VITAMIN D3) 5000 units TABS Take 1 tablet by mouth daily.  [name] escitalopram (LEXAPRO) 20 MG tablet Take 1 tablet (20 mg total) by mouth daily.  . fish oil-omega-3 fatty acids 1000 MG capsule Take 2 g by mouth 2 (two) times daily.   . fluticasone furoate-vilanterol (BREO ELLIPTA) 100-25 MCG/INH AEPB Inhale 1 puff into the lungs daily.  [name] glucose blood (IGLUCOSE TEST STRIPS) test strip Use to check blood sugars twice daily Dx E11.9  . hydrOXYzine (VISTARIL) 25 MG capsule Take 1 capsule (25 mg total) by mouth 3 (three) times daily as needed.  . isosorbide mononitrate (IMDUR) 30 MG 24 hr tablet Take 0.5 tablets (15 mg total) by mouth daily.  . Lancets MISC Use to check blood sugars twice daily ICD 10 E11.9  . lisinopril-hydrochlorothiazide (ZESTORETIC) 20-12.5 MG tablet Take 2 tablets by mouth daily.  . metFORMIN (GLUCOPHAGE) 500 MG tablet Take 1 tablet (500 mg total) by mouth 2  (two) times daily with a meal.  . metoprolol tartrate (LOPRESSOR) 25 MG tablet Take 1 tablet (25 mg total) by mouth 2 (two) times daily.  . pramipexole (MIRAPEX) 0.5 MG tablet Take 1 tablet (0.5 mg total) by mouth 2 (two) times daily.  . vitamin B-12 (CYANOCOBALAMIN) 1000 MCG tablet Take 1,000 mcg by mouth daily.     Past Surgical History:  Procedure Laterality Date  . AXILLARY LYMPH NODE DISSECTION  07/31/2011   Procedure: AXILLARY LYMPH NODE DISSECTION;  Surgeon: [name], MD;  Location: [place];  Service: General;  Laterality: Left;  left axillary sentinal node biopsy  . BREAST SURGERY    . CARDIAC CATHETERIZATION  11/02/2006; 06/03/2011  . CESAREAN SECTION  1987  .  CORONARY STENT PLACEMENT  11/02/2006  . ELBOW SURGERY     right  . MASTECTOMY PARTIAL / LUMPECTOMY  06/30/2011   left    Family History  Problem Relation Age of Onset  . Heart disease Mother   . Heart disease Father   . Cancer Maternal Aunt        pt unaware of what kind  . Cancer Maternal Grandmother        ovarian  . Heart attack Sister   . Bipolar disorder Sister     New complaints: Doing well  Social history: Lives by herself and is happy because she is getting to see her grand babies again.  Controlled substance contract: 05/17/19    Review of Systems  Constitutional: Negative for diaphoresis and weight loss.  Eyes: Negative for blurred vision, double vision and pain.  Respiratory: Negative for shortness of breath.   Cardiovascular: Negative for chest pain, palpitations, orthopnea and leg swelling.  Gastrointestinal: Negative for abdominal pain.  Musculoskeletal: Positive for myalgias.  Skin: Negative for rash.  Neurological: Negative for dizziness, sensory change, loss of consciousness, weakness and headaches.  Endo/Heme/Allergies: Negative for polydipsia. Does not bruise/bleed easily.  Psychiatric/Behavioral: Negative for memory loss. The patient does not have insomnia.    All other systems reviewed and are negative.    Observations/Objective: Alert and oriented- answers all questions appropriately No distress Slight cough  Assessment and Plan: ALDENA WORM comes in today with chief complaint of Medical Management of Chronic Issues   Diagnosis and orders addressed:  1. Essential hypertension Low sodium diet - lisinopril-hydrochlorothiazide (ZESTORETIC) 20-12.5 MG tablet; Take 2 tablets by mouth daily.  Dispense: 180 tablet; Refill: 1  2. Type 2 diabetes mellitus with other circulatory complication, without long-term current use of insulin (HCC) Continue to wtahc carbs in diet - metFORMIN (GLUCOPHAGE) 500 MG tablet; Take 1 tablet (500 mg total) by mouth 2 (two) times daily with a meal.  Dispense: 180 tablet; Refill: 1  3. Coronary artery disease due to lipid rich plaque Will make appointment with cardiology  4. Chronic obstructive pulmonary disease, unspecified COPD type ([place]) Continue inhaler- she is going to call the company about feeling loke she is not getting meds form inhaler sometimes - fluticasone furoate-vilanterol (BREO ELLIPTA) 100-25 MCG/INH AEPB; Inhale 1 puff into the lungs daily.  Dispense: 180 each; Refill: 1  5. Pure hypercholesterolemia Low fat diet - atorvastatin (LIPITOR) 80 MG tablet; Take 1 tablet (80 mg total) by mouth daily. (Needs to be seen before next refill)  Dispense: 90 tablet; Refill: 1 - metoprolol tartrate (LOPRESSOR) 25 MG tablet; Take 1 tablet (25 mg total) by mouth 2 (two) times daily.  Dispense: 180 tablet; Refill: 1   6. Generalized anxiety disorder Stress managemnet - ALPRAZolam (XANAX) 1 MG tablet; Take 1 tablet (1 mg total) by mouth 3 (three) times daily as needed for anxiety.  Dispense: 90 tablet; Refill: 2  7. Vitamin D deficiency Continue daily vitamin d supplement  8. Restless leg syndrome Keep legs warm at night - pramipexole (MIRAPEX) 0.5 MG tablet; Take 1 tablet (0.5 mg total) by mouth 2  (two) times daily.  Dispense: 180 tablet; Refill: 1  9. Recurrent major depressive disorder, in full remission ([place]) - escitalopram (LEXAPRO) 20 MG tablet; Take 1 tablet (20 mg total) by mouth daily.  Dispense: 90 tablet; Refill: 1 - ARIPiprazole (ABILIFY) 2 MG tablet; Take 1 tablet (2 mg total) by mouth daily.  Dispense: 90 tablet; Refill: 1  10.  Morbid obesity ([place]) Discussed diet and exercise for person with BMI >25 Will recheck weight in 3-6 months 11. Atherosclerosis of native coronary artery without angina pectoris, unspecified whether native or transplanted heart  - isosorbide mononitrate (IMDUR) 30 MG 24 hr tablet; Take 0.5 tablets (15 mg total) by mouth daily.  Dispense: 90 tablet; Refill: 1     Labs pending Health Maintenance reviewed Diet and exercise encouraged  Follow up plan: 2 months- needs blod work      I discussed the assessment and treatment plan with the patient. The patient was provided an opportunity to ask questions and all were answered. The patient agreed with the plan and demonstrated an understanding of the instructions.   The patient was advised to call back or seek an in-person evaluation if the symptoms worsen or if the condition fails to improve as anticipated.  The above assessment and management plan was discussed with the patient. The patient verbalized understanding of and has agreed to the management plan. Patient is aware to call the clinic if symptoms persist or worsen. Patient is aware when to return to the clinic for a follow-up visit. Patient educated on when it is appropriate to go to the emergency department.   Time call ended:  2:25 I provided 20 minutes of non-face-to-face time during this encounter.    [name], FNP

## 2019-09-14 ENCOUNTER — Ambulatory Visit (INDEPENDENT_AMBULATORY_CARE_PROVIDER_SITE_OTHER): Payer: Medicare HMO | Admitting: Licensed Clinical Social Worker

## 2019-09-14 DIAGNOSIS — F411 Generalized anxiety disorder: Secondary | ICD-10-CM

## 2019-09-14 DIAGNOSIS — J441 Chronic obstructive pulmonary disease with (acute) exacerbation: Secondary | ICD-10-CM

## 2019-09-14 DIAGNOSIS — I2510 Atherosclerotic heart disease of native coronary artery without angina pectoris: Secondary | ICD-10-CM

## 2019-09-14 DIAGNOSIS — I10 Essential (primary) hypertension: Secondary | ICD-10-CM | POA: Diagnosis not present

## 2019-09-14 DIAGNOSIS — I2583 Coronary atherosclerosis due to lipid rich plaque: Secondary | ICD-10-CM | POA: Diagnosis not present

## 2019-09-14 DIAGNOSIS — E7800 Pure hypercholesterolemia, unspecified: Secondary | ICD-10-CM | POA: Diagnosis not present

## 2019-09-14 NOTE — Patient Instructions (Addendum)
Licensed Clinical Social Worker Visit Information  Goals we discussed today:  Goals        . Client wants to talk with [name] about depression and decreased motivation of client (pt-stated)     Current Barriers:  . Financial barriers . Social  isolation challenges in patient with Chronic Diagnoses of GAD, COPD, CAD, HLD and HTN  Clinical Social Work Clinical Goal(s):  [name] [name] and Client to talk in next 30 days about client management of depression symptoms and and to talk about client motivation challenges.   Interventions:  Previously spoke with [name] about CCM program  Talkedpreviously with Briselda about RNCM support for nursing needs  Talkedpreviously with client about relaxation techniques (talks on phone with friends, uses computer to play games.  Previously talkedwith clientabout ambulation challenges with client  Previously talked with client about mental health care history of client  . Patient Self Care Activities:  . Takes medications as prescribed . Attends scheduled medical appointments   Plan:   Client to attend scheduled medical appointments  Client to use relaxation techniques of choice to manage symptoms faced [name] to call client in next 4 weeks to assess psychosocial needs of client.   Initial goal documentation      Materials Provided: No  Follow Up Plan:  [name] to call client in next 4 weeks to assess the psychosocial needs of client at that time  The patient verbalized understanding of instructions provided today and declined a print copy of patient instruction materials.   [name].Harlin Mazzoni MSW, [name] Licensed Clinical Social Worker [facility] [phone]

## 2019-09-14 NOTE — Chronic Care Management (AMB) (Signed)
  Care Management Note   [name] is a 64 y.o. year old female who is a primary care patient of [name], [name]. The CM team was consulted for assistance with chronic disease management and care coordination.   I reached out to [name] by phone today.    Review of patient status, including review of consultants reports, relevant laboratory and other test results, and collaboration with appropriate care team members and the patient's provider was performed as part of comprehensive patient evaluation and provision of chronic care management services.   Social determinants of health: risk of tobacco use; risk of depression; risk of food insecurity    Office Visit from 08/07/2019 in [place]  PHQ-9 Total Score  22     GAD 7 : Generalized Anxiety Score 09/05/2019 08/07/2019 05/16/2019 03/08/2019  Nervous, Anxious, on Edge '1 3 2 1  '$ Control/stop worrying '1 3 3 2  '$ Worry too much - different things '1 3 3 1  '$ Trouble relaxing 0 '2 2 1  '$ Restless 0 '1 2 1  '$ Easily annoyed or irritable 0 3 1 0  Afraid - awful might happen 0 '1 3 1  '$ Total GAD 7 Score '3 16 16 7  '$ Anxiety Difficulty Not difficult at all Very difficult - Somewhat difficult   Medications   albuterol (PROVENTIL HFA;VENTOLIN HFA) 108 (90 Base) MCG/ACT inhaler ALPRAZolam (XANAX) 1 MG tablet ARIPiprazole (ABILIFY) 2 MG tablet aspirin 325 MG tablet atorvastatin (LIPITOR) 80 MG tablet blood glucose meter kit and supplies KIT Blood Glucose Monitoring Suppl (BLOOD GLUCOSE MONITOR SYSTEM) w/Device KIT Cholecalciferol (VITAMIN D3) 5000 units TABS escitalopram (LEXAPRO) 20 MG tablet fish oil-omega-3 fatty acids 1000 MG capsule fluticasone furoate-vilanterol (BREO ELLIPTA) 100-25 MCG/INH AEPB glucose blood (IGLUCOSE TEST STRIPS) test strip hydrOXYzine (VISTARIL) 25 MG capsule isosorbide mononitrate (IMDUR) 30 MG 24 hr tablet Lancets MISC lisinopril-hydrochlorothiazide (ZESTORETIC) 20-12.5 MG  tablet metFORMIN (GLUCOPHAGE) 500 MG tablet metoprolol tartrate (LOPRESSOR) 25 MG tablet pramipexole (MIRAPEX) 0.5 MG tablet vitamin B-12 (CYANOCOBALAMIN) 1000 MCG tablet  Goals        . Client wants to talk with [name] about depression and decreased motivation of client (pt-stated)     Current Barriers:  . Financial barriers . Social  isolation challenges in patient with Chronic Diagnoses of GAD, COPD, CAD, HLD and HTN  Clinical Social Work Clinical Goal(s):  [name] [name] and Client to talk in next 30 days about client management of depression symptoms and talk about client motivation challenges.   Interventions:  Previously spoke with [name] about CCM program  Talked previously with [name] about RNCM support for nursing needs  Talked previously with client about relaxation techniques (talks on phone with friends, uses computer to play games.   Previously talked with client about ambulation challenges with client  Previously talked with client about mental health care history of client  . Patient Self Care Activities:  . Takes medications as prescribed . Attends scheduled medical appointments   Plan:   Client to attend scheduled medical appointments  Client to use relaxation techniques of choice to manage symptoms faced [name] to call client in next 4 weeks to assess psychosocial needs of client.   Initial goal documentation       Follow Up Plan: [name] will call client in next 4 weeks to assess the psychosocial needs of client at that time  [name] MSW, [name] Licensed Clinical Social Worker [facility] [phone]

## 2019-09-15 DIAGNOSIS — R079 Chest pain, unspecified: Secondary | ICD-10-CM | POA: Diagnosis not present

## 2019-09-15 DIAGNOSIS — R0689 Other abnormalities of breathing: Secondary | ICD-10-CM | POA: Diagnosis not present

## 2019-09-15 DIAGNOSIS — I10 Essential (primary) hypertension: Secondary | ICD-10-CM | POA: Diagnosis not present

## 2019-09-15 DIAGNOSIS — R0789 Other chest pain: Secondary | ICD-10-CM | POA: Diagnosis not present

## 2019-10-03 ENCOUNTER — Ambulatory Visit (INDEPENDENT_AMBULATORY_CARE_PROVIDER_SITE_OTHER): Payer: Medicare HMO | Admitting: *Deleted

## 2019-10-03 DIAGNOSIS — I10 Essential (primary) hypertension: Secondary | ICD-10-CM

## 2019-10-03 DIAGNOSIS — E1159 Type 2 diabetes mellitus with other circulatory complications: Secondary | ICD-10-CM

## 2019-10-03 DIAGNOSIS — I2583 Coronary atherosclerosis due to lipid rich plaque: Secondary | ICD-10-CM

## 2019-10-03 DIAGNOSIS — J449 Chronic obstructive pulmonary disease, unspecified: Secondary | ICD-10-CM

## 2019-10-03 DIAGNOSIS — I2510 Atherosclerotic heart disease of native coronary artery without angina pectoris: Secondary | ICD-10-CM

## 2019-10-03 NOTE — Chronic Care Management (AMB) (Signed)
Chronic Care Management   Follow Up Note   10/03/2019 Name: [name] MRN: [id number] DOB: Nov 30, 1955  Referred by: [name], FNP Reason for referral : Chronic Care Management (RN follow up)   [name] is a 64 y.o. year old female who is a primary care patient of [name], [place]. The CCM team was consulted for assistance with chronic disease management and care coordination needs.    Review of patient status, including review of consultants reports, relevant laboratory and other test results, and collaboration with appropriate care team members and the patient's provider was performed as part of comprehensive patient evaluation and provision of chronic care management services.    SDOH (Social Determinants of Health) assessments performed: No See Care Plan activities for detailed interventions related to SDOH)    Subjective: I spoke with [name] by telephone today regarding her chronic medical conditions. She has a blood pressure monitor but has misplaced it and has considered purchasing a new one. She also has a glucometer but does not check her blood sugar regularly. She has not scheduled a cardiology follow-up as discussed at her last PCP appointment.     Objective: Outpatient Encounter Medications as of 10/03/2019  Medication Sig  . albuterol (PROVENTIL HFA;VENTOLIN HFA) 108 (90 Base) MCG/ACT inhaler Inhale 2 puffs into the lungs every 6 (six) hours as needed for wheezing or shortness of breath.  . ALPRAZolam (XANAX) 1 MG tablet Take 1 tablet (1 mg total) by mouth 3 (three) times daily as needed for anxiety.  . ARIPiprazole (ABILIFY) 2 MG tablet Take 1 tablet (2 mg total) by mouth daily.  [name] aspirin 325 MG tablet Take 325 mg by mouth daily.    [name] atorvastatin (LIPITOR) 80 MG tablet Take 1 tablet (80 mg total) by mouth daily. (Needs to be seen before next refill)  . blood glucose meter kit and supplies KIT Dispense based on patient and insurance  preference. Use up to four times daily as directed. (FOR ICD-9 250.00, 250.01).  . Blood Glucose Monitoring Suppl (BLOOD GLUCOSE MONITOR SYSTEM) w/Device KIT 1 Device by Does not apply route 2 (two) times daily.  . Cholecalciferol (VITAMIN D3) 5000 units TABS Take 1 tablet by mouth daily.  [name] escitalopram (LEXAPRO) 20 MG tablet Take 1 tablet (20 mg total) by mouth daily.  . fish oil-omega-3 fatty acids 1000 MG capsule Take 2 g by mouth 2 (two) times daily.   . fluticasone furoate-vilanterol (BREO ELLIPTA) 100-25 MCG/INH AEPB Inhale 1 puff into the lungs daily.  [name] glucose blood (IGLUCOSE TEST STRIPS) test strip Use to check blood sugars twice daily Dx E11.9  . hydrOXYzine (VISTARIL) 25 MG capsule Take 1 capsule (25 mg total) by mouth 3 (three) times daily as needed.  . isosorbide mononitrate (IMDUR) 30 MG 24 hr tablet Take 0.5 tablets (15 mg total) by mouth daily.  . Lancets MISC Use to check blood sugars twice daily ICD 10 E11.9  . lisinopril-hydrochlorothiazide (ZESTORETIC) 20-12.5 MG tablet Take 2 tablets by mouth daily.  . metFORMIN (GLUCOPHAGE) 500 MG tablet Take 1 tablet (500 mg total) by mouth 2 (two) times daily with a meal.  . metoprolol tartrate (LOPRESSOR) 25 MG tablet Take 1 tablet (25 mg total) by mouth 2 (two) times daily.  . pramipexole (MIRAPEX) 0.5 MG tablet Take 1 tablet (0.5 mg total) by mouth 2 (two) times daily.  . vitamin B-12 (CYANOCOBALAMIN) 1000 MCG tablet Take 1,000 mcg by mouth daily.   No facility-administered encounter medications  on file as of 10/03/2019.    Lab Results  Component Value Date   HGBA1C 6.8 05/16/2019   HGBA1C 6.1 08/12/2018   HGBA1C 5.9 01/11/2018   Lab Results  Component Value Date   LDLCALC 75 05/16/2019   CREATININE 0.88 05/16/2019   BP Readings from Last 3 Encounters:  05/16/19 (!) 175/86  04/25/19 (!) 155/81  08/12/18 (!) 156/76     RN Assessment & Care Plan   . Blood Sugar Management       CARE PLAN ENTRY (see longtitudinal plan of  care for additional care plan information)  Current Barriers:  . Chronic Disease Management support and education needs related to diabetes  Nurse Case Manager Clinical Goal(s):  [name] Over the next 60 days, patient will work with Consulting civil engineer to address needs related to diabetes  Interventions:  . Evaluation of current treatment plan related to diabetes and patient's adherence to plan as established by provider. . Reviewed medications with patient and discussed metformin . Discussed plans with patient for ongoing care management follow up and provided patient with direct contact information for care management team . Advised patient, providing education and rationale, to check cbg several times a week and record, calling [phone] for findings outside established parameters.    Patient Self Care Activities:  . Performs ADL's independently . Performs IADL's independently  Initial goal documentation     . BP Management       CARE PLAN ENTRY (see longtitudinal plan of care for additional care plan information)  Current Barriers:  . Chronic Disease Management support and education needs related to hypertension  Nurse Case Manager Clinical Goal(s):  [name] Over the next 60 days, patient will work with Consulting civil engineer to address needs related to hypertension  Interventions:  . Evaluation of current treatment plan related to hypertension and patient's adherence to plan as established by provider. . Reviewed medications with patient and discussed lisinopril/HCTZ 20/12.5 dose and directions . Collaborated with [facility] Clinical Staff regarding referral to cardiologist, Dr [name] . Discussed plans with patient for ongoing care management follow up and provided patient with direct contact information for care management team . Advised patient, providing education and rationale, to monitor blood pressure daily and record, calling [phone] for findings outside established parameters.   . Reviewed scheduled/upcoming provider appointments including:   Patient Self Care Activities:  . Performs ADL's independently . Performs IADL's independently  Initial goal documentation     . COPD Management       CARE PLAN ENTRY (see longtitudinal plan of care for additional care plan information)  Current Barriers:  . Chronic Disease Management support and education needs related to COPD  Nurse Case Manager Clinical Goal(s):  [name] Over the next 60 days, patient will work with Consulting civil engineer to address needs related to COPD  Interventions:  . Evaluation of current treatment plan related to COPD and patient's adherence to plan as established by provider. . Reviewed medications with patient and discussed Breo and albuterol . Discussed plans with patient for ongoing care management follow up and provided patient with direct contact information for care management team . Talked with patient by telephone. Discussed recent 911 call due to chest pain/tightness and SOB. EMS advised that heart checked out ok. BP was elevated with systolic pressure around 270. She did not go to the ED. Determined that wood she had brought in for wood burning stove had "black mold" on it. Symptoms resolved after taking the wood outside  and airing the house out.   Patient Self Care Activities:  . Performs ADL's independently . Performs IADL's independently  Initial goal documentation     . Urinary Incontinence       CARE PLAN ENTRY (see longtitudinal plan of care for additional care plan information)  Current Barriers:  [name] Knowledge Deficits related to cause and management of urinary incontinence  Nurse Case Manager Clinical Goal(s):  [name] Over the next 60 days, patient will meet with RN Care Manager to address urinary incontinence  Interventions:  . Talked with patient by telephone . Discussed HPI o Increased urge incontinence with several accidents over the past 6 months o No UTI  symptoms . Discussed medications and that Lisinopril/HCTZ. Explained that HCTZ is a diuretic and can increase urination.  . Encouraged patient to empty her bladder regularly every 2 to 3 hours . Discussed bladder training . Advised patient to reach out to PCP if symptoms worsen or if new symptoms develop . Provided with CCM contact information and encouraged to reach out as needed  Patient Self Care Activities:  . Performs ADL's independently . Performs IADL's independently   Initial goal documentation         Follow-up Plan:  Telephone f/u with Theadore Nan, [name] on 10/11/2019 Follow-up with [name], FNP on 11/03/19 Telephone f/u with [place] on 11/17/19 Cardiology referral pending   [name], BSN, RN-BC [place] / [place] Management Direct Dial: [phone]

## 2019-10-03 NOTE — Patient Instructions (Signed)
RN Assessment & Care Plan   . Blood Sugar Management       CARE PLAN ENTRY (see longtitudinal plan of care for additional care plan information)  Current Barriers:  . Chronic Disease Management support and education needs related to diabetes  Nurse Case Manager Clinical Goal(s):  [name] Over the next 60 days, patient will work with Consulting civil engineer to address needs related to diabetes  Interventions:  . Evaluation of current treatment plan related to diabetes and patient's adherence to plan as established by provider. . Reviewed medications with patient and discussed metformin . Discussed plans with patient for ongoing care management follow up and provided patient with direct contact information for care management team . Advised patient, providing education and rationale, to check cbg several times a week and record, calling [phone] for findings outside established parameters.    Patient Self Care Activities:  . Performs ADL's independently . Performs IADL's independently  Initial goal documentation     . BP Management       CARE PLAN ENTRY (see longtitudinal plan of care for additional care plan information)  Current Barriers:  . Chronic Disease Management support and education needs related to hypertension  Nurse Case Manager Clinical Goal(s):  [name] Over the next 60 days, patient will work with Consulting civil engineer to address needs related to hypertension  Interventions:  . Evaluation of current treatment plan related to hypertension and patient's adherence to plan as established by provider. . Reviewed medications with patient and discussed lisinopril/HCTZ 20/12.5 dose and directions . Collaborated with [facility] Clinical Staff regarding referral to cardiologist, Dr [name] . Discussed plans with patient for ongoing care management follow up and provided patient with direct contact information for care management team . Advised patient, providing education and rationale, to monitor blood  pressure daily and record, calling [phone] for findings outside established parameters.  . Reviewed scheduled/upcoming provider appointments including:   Patient Self Care Activities:  . Performs ADL's independently . Performs IADL's independently  Initial goal documentation     . COPD Management       CARE PLAN ENTRY (see longtitudinal plan of care for additional care plan information)  Current Barriers:  . Chronic Disease Management support and education needs related to COPD  Nurse Case Manager Clinical Goal(s):  [name] Over the next 60 days, patient will work with Consulting civil engineer to address needs related to COPD  Interventions:  . Evaluation of current treatment plan related to COPD and patient's adherence to plan as established by provider. . Reviewed medications with patient and discussed Breo and albuterol . Discussed plans with patient for ongoing care management follow up and provided patient with direct contact information for care management team . Talked with patient by telephone. Discussed recent 911 call due to chest pain/tightness and SOB. EMS advised that heart checked out ok. BP was elevated with systolic pressure around 791. She did not go to the ED. Determined that wood she had brought in for wood burning stove had "black mold" on it. Symptoms resolved after taking the wood outside and airing the house out.   Patient Self Care Activities:  . Performs ADL's independently . Performs IADL's independently  Initial goal documentation     . Urinary Incontinence       CARE PLAN ENTRY (see longtitudinal plan of care for additional care plan information)  Current Barriers:  [name] Knowledge Deficits related to cause and management of urinary incontinence  Nurse Case Manager Clinical Goal(s):  .  Over the next 60 days, patient will meet with RN Care Manager to address urinary incontinence  Interventions:  . Talked with patient by telephone . Discussed HPI o Increased  urge incontinence with several accidents over the past 6 months o No UTI symptoms . Discussed medications and that Lisinopril/HCTZ. Explained that HCTZ is a diuretic and can increase urination.  . Encouraged patient to empty her bladder regularly every 2 to 3 hours . Discussed bladder training . Advised patient to reach out to PCP if symptoms worsen or if new symptoms develop . Provided with CCM contact information and encouraged to reach out as needed  Patient Self Care Activities:  . Performs ADL's independently . Performs IADL's independently   Initial goal documentation         Follow-up Plan:  Telephone f/u with Theadore Nan, [name] on 10/11/2019 Follow-up with [name], [place] on 11/03/19 Telephone f/u with [place] on 11/17/19 Cardiology referral pending   [name], BSN, RN-BC [place] / [place]: [phone]   The patient verbalized understanding of instructions provided today and declined a print copy of patient instruction materials.

## 2019-10-05 ENCOUNTER — Telehealth: Payer: Self-pay

## 2019-10-05 NOTE — Telephone Encounter (Signed)
Attempted to call again. No VM.  Will try again later.

## 2019-10-05 NOTE — Telephone Encounter (Signed)
Patient was in the referral queue for Dr. [name]. She saw him in 2012.   Attempted to call patient to arrange appointment but no VM was set up.  Will try again later.

## 2019-10-06 NOTE — Telephone Encounter (Signed)
Attempted to call again. No VM.  Will try again later.

## 2019-10-10 NOTE — Telephone Encounter (Signed)
Attempted to call patient to arrange appointment.  No VM is set up. Unable to leave message. Letter sent to patient to contact the office with a working phone number.

## 2019-10-11 ENCOUNTER — Ambulatory Visit: Payer: Self-pay | Admitting: Licensed Clinical Social Worker

## 2019-10-11 DIAGNOSIS — J441 Chronic obstructive pulmonary disease with (acute) exacerbation: Secondary | ICD-10-CM

## 2019-10-11 DIAGNOSIS — I2583 Coronary atherosclerosis due to lipid rich plaque: Secondary | ICD-10-CM

## 2019-10-11 DIAGNOSIS — E1159 Type 2 diabetes mellitus with other circulatory complications: Secondary | ICD-10-CM

## 2019-10-11 DIAGNOSIS — I2510 Atherosclerotic heart disease of native coronary artery without angina pectoris: Secondary | ICD-10-CM

## 2019-10-11 DIAGNOSIS — E785 Hyperlipidemia, unspecified: Secondary | ICD-10-CM | POA: Diagnosis not present

## 2019-10-11 DIAGNOSIS — I10 Essential (primary) hypertension: Secondary | ICD-10-CM

## 2019-10-11 DIAGNOSIS — J449 Chronic obstructive pulmonary disease, unspecified: Secondary | ICD-10-CM

## 2019-10-11 DIAGNOSIS — F411 Generalized anxiety disorder: Secondary | ICD-10-CM

## 2019-10-11 NOTE — Patient Instructions (Addendum)
Licensed Clinical Social Worker Visit Information  Goals we discussed today:  Goals    . Client wants to talk with [name] about depression and decreased motivation of client (pt-stated)     Current Barriers:  . Financial barriers . Social  isolation challenges in patient with Chronic Diagnoses of GAD, COPD, CAD, HLD and HTN  Clinical Social Work Clinical Goal(s):  [name] [name] and Client to talk in next 30 days about client management of depression symptoms and talk about client motivation challenges.   Interventions:   Previously spoke with [name] about CCM program  Talkedpreviously with Nariah about RNCM support for nursing needs  Talkedpreviously with client about relaxation techniques (talks on phone with friends, uses computer to play games.  Previously talkedwith clientabout ambulation challenges with client  Previously talked with client about mental health care history   Patient Self Care Activities:  . Takes medications as prescribed . Attends scheduled medical appointments   Plan:   Client to attend scheduled medical appointments  Client to use relaxation techniques of choice to manage symptoms faced [name] to call client in next 4 weeks to assess psychosocial needs of client.   Initial goal documentation    Materials Provided: No  Follow Up Plan: [name] to call client in next 4 weeks to assess the psychosocial needs of client at that time  The patient verbalized understanding of instructions provided today and declined a print copy of patient instruction materials.   [name].Ardith Test MSW, [name] Licensed Clinical Social Worker [facility] Care Management [phone]

## 2019-10-11 NOTE — Chronic Care Management (AMB) (Signed)
  Care Management Note   [name] is a 64 y.o. year old female who is a primary care patient of [name], [place]. The CM team was consulted for assistance with chronic disease management and care coordination.   I reached out to [name] by phone today.    Review of patient status, including review of consultants reports, relevant laboratory and other test results, and collaboration with appropriate care team members and the patient's provider was performed as part of comprehensive patient evaluation and provision of chronic care management services.   Social determinants of health: risk of tobacco use; risk of depression; risk of food insecurity    Office Visit from 08/07/2019 in [place]  PHQ-9 Total Score  22     GAD 7 : Generalized Anxiety Score 09/05/2019 08/07/2019 05/16/2019 03/08/2019  Nervous, Anxious, on Edge '1 3 2 1  '$ Control/stop worrying '1 3 3 2  '$ Worry too much - different things '1 3 3 1  '$ Trouble relaxing 0 '2 2 1  '$ Restless 0 '1 2 1  '$ Easily annoyed or irritable 0 3 1 0  Afraid - awful might happen 0 '1 3 1  '$ Total GAD 7 Score '3 16 16 7  '$ Anxiety Difficulty Not difficult at all Very difficult - Somewhat difficult   Medications   albuterol (PROVENTIL HFA;VENTOLIN HFA) 108 (90 Base) MCG/ACT inhaler ALPRAZolam (XANAX) 1 MG tablet ARIPiprazole (ABILIFY) 2 MG tablet aspirin 325 MG tablet atorvastatin (LIPITOR) 80 MG tablet blood glucose meter kit and supplies KIT Blood Glucose Monitoring Suppl (BLOOD GLUCOSE MONITOR SYSTEM) w/Device KIT Cholecalciferol (VITAMIN D3) 5000 units TABS escitalopram (LEXAPRO) 20 MG tablet fish oil-omega-3 fatty acids 1000 MG capsule fluticasone furoate-vilanterol (BREO ELLIPTA) 100-25 MCG/INH AEPB glucose blood (IGLUCOSE TEST STRIPS) test strip hydrOXYzine (VISTARIL) 25 MG capsule isosorbide mononitrate (IMDUR) 30 MG 24 hr tablet Lancets MISC lisinopril-hydrochlorothiazide (ZESTORETIC) 20-12.5 MG  tablet metFORMIN (GLUCOPHAGE) 500 MG tablet metoprolol tartrate (LOPRESSOR) 25 MG tablet pramipexole (MIRAPEX) 0.5 MG tablet vitamin B-12 (CYANOCOBALAMIN) 1000 MCG tablet  GOAL:  . Client wants to talk with [name] about depression and decreased motivation of client (pt-stated)     Current Barriers:  . Financial barriers . Social  isolation challenges in patient with Chronic Diagnoses of GAD, COPD, CAD, HLD and HTN  Clinical Social Work Clinical Goal(s):  [name] [name] and Client to talk in next 30 days about client management of depression symptoms and talk about client motivation challenges.   Interventions:  Previously spoke with [name] about CCM program  Talkedpreviously with [place] about RNCM support for nursing needs  Talkedpreviously with client about relaxation techniques (talks on phone with friends, uses computer to play games.  Previously talkedwith clientabout ambulation challenges with client  Previously talked with client about mental health care history of client  . Patient Self Care Activities:  . Takes medications as prescribed . Attends scheduled medical appointments   Plan:   Client to attend scheduled medical appointments  Client to use relaxation techniques of choice to manage symptoms faced [name] to call client in next 4 weeks to assess psychosocial needs of client.   Initial goal documentation     Follow Up Plan: [name] to call client in next 4 weeks to assess the psychosocial needs of client at that time  [name].[name] MSW, [name] Licensed Clinical Social Worker [facility] Care Management [phone]

## 2019-11-03 ENCOUNTER — Encounter: Payer: Self-pay | Admitting: Nurse Practitioner

## 2019-11-03 ENCOUNTER — Encounter: Payer: Medicare HMO | Admitting: Nurse Practitioner

## 2019-11-03 ENCOUNTER — Other Ambulatory Visit: Payer: Self-pay

## 2019-11-06 ENCOUNTER — Ambulatory Visit: Payer: Medicare HMO | Admitting: Nurse Practitioner

## 2019-11-06 ENCOUNTER — Encounter: Payer: Self-pay | Admitting: Nurse Practitioner

## 2019-11-06 ENCOUNTER — Other Ambulatory Visit: Payer: Self-pay

## 2019-11-06 ENCOUNTER — Ambulatory Visit: Payer: Medicare HMO

## 2019-11-07 ENCOUNTER — Ambulatory Visit (INDEPENDENT_AMBULATORY_CARE_PROVIDER_SITE_OTHER): Payer: Medicare HMO | Admitting: Licensed Clinical Social Worker

## 2019-11-07 DIAGNOSIS — I2510 Atherosclerotic heart disease of native coronary artery without angina pectoris: Secondary | ICD-10-CM

## 2019-11-07 DIAGNOSIS — E785 Hyperlipidemia, unspecified: Secondary | ICD-10-CM

## 2019-11-07 DIAGNOSIS — I10 Essential (primary) hypertension: Secondary | ICD-10-CM

## 2019-11-07 DIAGNOSIS — F411 Generalized anxiety disorder: Secondary | ICD-10-CM

## 2019-11-07 DIAGNOSIS — J449 Chronic obstructive pulmonary disease, unspecified: Secondary | ICD-10-CM

## 2019-11-07 NOTE — Chronic Care Management (AMB) (Signed)
Chronic Care Management    Clinical Social Work Follow Up Note  11/07/2019 Name: [name] MRN: [id number] DOB: Apr 16, 1956  [name] is a 64 y.o. year old female who is a primary care patient of [name], [place]. The CCM team was consulted for assistance with Mental Health Counseling and Resources.   Review of patient status, including review of consultants reports, other relevant assessments, and collaboration with appropriate care team members and the patient's provider was performed as part of comprehensive patient evaluation and provision of chronic care management services.    SDOH (Social Determinants of Health) assessments performed: Yes; risk of tobacco use; risk of depression; risk of food insecurity    Office Visit from 08/07/2019 in [facility]  PHQ-9 Total Score  22       GAD 7 : Generalized Anxiety Score 09/05/2019 08/07/2019 05/16/2019 03/08/2019  Nervous, Anxious, on Edge 1 3 2 1   Control/stop worrying 1 3 3 2   Worry too much - different things 1 3 3 1   Trouble relaxing 0 2 2 1   Restless 0 1 2 1   Easily annoyed or irritable 0 3 1 0  Afraid - awful might happen 0 1 3 1   Total GAD 7 Score 3 16 16 7   Anxiety Difficulty Not difficult at all Very difficult - Somewhat difficult    Outpatient Encounter Medications as of 11/07/2019  Medication Sig  . albuterol (PROVENTIL HFA;VENTOLIN HFA) 108 (90 Base) MCG/ACT inhaler Inhale 2 puffs into the lungs every 6 (six) hours as needed for wheezing or shortness of breath.  . ALPRAZolam (XANAX) 1 MG tablet Take 1 tablet (1 mg total) by mouth 3 (three) times daily as needed for anxiety.  . ARIPiprazole (ABILIFY) 2 MG tablet Take 1 tablet (2 mg total) by mouth daily.  [name] aspirin 325 MG tablet Take 325 mg by mouth daily.    [name] atorvastatin (LIPITOR) 80 MG tablet Take 1 tablet (80 mg total) by mouth daily. (Needs to be seen before next refill)  . blood glucose meter kit and supplies KIT Dispense  based on patient and insurance preference. Use up to four times daily as directed. (FOR ICD-9 250.00, 250.01).  . Blood Glucose Monitoring Suppl (BLOOD GLUCOSE MONITOR SYSTEM) w/Device KIT 1 Device by Does not apply route 2 (two) times daily.  . Cholecalciferol (VITAMIN D3) 5000 units TABS Take 1 tablet by mouth daily.  [name] escitalopram (LEXAPRO) 20 MG tablet Take 1 tablet (20 mg total) by mouth daily.  . fish oil-omega-3 fatty acids 1000 MG capsule Take 2 g by mouth 2 (two) times daily.   . fluticasone furoate-vilanterol (BREO ELLIPTA) 100-25 MCG/INH AEPB Inhale 1 puff into the lungs daily.  [name] glucose blood (IGLUCOSE TEST STRIPS) test strip Use to check blood sugars twice daily Dx E11.9  . hydrOXYzine (VISTARIL) 25 MG capsule Take 1 capsule (25 mg total) by mouth 3 (three) times daily as needed.  . isosorbide mononitrate (IMDUR) 30 MG 24 hr tablet Take 0.5 tablets (15 mg total) by mouth daily.  . Lancets MISC Use to check blood sugars twice daily ICD 10 E11.9  . lisinopril-hydrochlorothiazide (ZESTORETIC) 20-12.5 MG tablet Take 2 tablets by mouth daily.  . metFORMIN (GLUCOPHAGE) 500 MG tablet Take 1 tablet (500 mg total) by mouth 2 (two) times daily with a meal.  . metoprolol tartrate (LOPRESSOR) 25 MG tablet Take 1 tablet (25 mg total) by mouth 2 (two) times daily.  . pramipexole (MIRAPEX) 0.5 MG tablet  Take 1 tablet (0.5 mg total) by mouth 2 (two) times daily.  . vitamin B-12 (CYANOCOBALAMIN) 1000 MCG tablet Take 1,000 mcg by mouth daily.   No facility-administered encounter medications on file as of 11/07/2019.    Goals    . Client wants to talk with [name] about depression and decreased motivation of client (pt-stated)     Current Barriers:  . Financial barriers . Social  isolation challenges in patient with Chronic Diagnoses of GAD, COPD, CAD, HLD and HTN  Clinical Social Work Clinical Goal(s):  [name] [name] and Client to talk in next 30 days about client management of depression symptoms and  talk about client motivation challenges.   Interventions: . Spoke with [name] about CCM program . Talked with Charletha about RNCM support for nursing needs . Talked with client about relaxation techniques (talks on phone with friends, uses computer to play games.  . Talked about ambulation challenges with client .  Talked with client about mental health care history of client .  Talked with client about current needs of client  . Talked with client about decreased energy of client . Provided counseling support for client . Collaborated with RNCM regarding nursing needs of client  . Patient Self Care Activities:  . Takes medications as prescribed . Attends scheduled medical appointments   Plan:   Client to attend scheduled medical appointments  Client to use relaxation techniques of choice to manage symptoms faced [name] to call client in next 4 weeks to assess psychosocial needs of client.   Initial goal documentation     Follow Up Plan: [name] to call client in next 4 weeks to assess the psychosocial needs of client at that time  [name].Tineka Uriegas MSW, [name] Licensed Clinical Social Worker [facility] Care Management [phone]

## 2019-11-07 NOTE — Patient Instructions (Addendum)
Licensed Clinical Social Worker Visit Information  Goals we discussed today:  Goals    . Client wants to talk with [name] about depression and decreased motivation of client (pt-stated)     Current Barriers:  . Financial barriers . Social  isolation challenges in patient with Chronic Diagnoses of GAD, COPD, CAD, HLD and HTN  Clinical Social Work Clinical Goal(s):  [name] [name] and Client to talk in next 30 days about client management of depression symptoms and talk about client motivation challenges.   Interventions: Spoke with [name] about CCM program  Talked with Zyniah about RNCM support for nursing needs  Talked with client about relaxation techniques (talks on phone with friends, uses computer to play games.  Talked about ambulation challenges with client  Talked with client about mental health care history of client  Talked with client about current needs of client  Talked with client about decreased energy of client  Provided counseling support for client  Collaborated with RNCM regarding nursing needs of client  . Patient Self Care Activities:  . Takes medications as prescribed . Attends scheduled medical appointments   Plan:   Client to attend scheduled medical appointments  Client to use relaxation techniques of choice to manage symptoms faced [name] to call client in next 4 weeks to assess psychosocial needs of client.   Initial goal documentation        Materials Provided: No  Follow Up Plan: [name] to call client in next 4 weeks to assess the psychosocial needs of client at that time  The patient verbalized understanding of instructions provided today and declined a print copy of patient instruction materials.   [name].[name] MSW, [name] Licensed Clinical Social Worker [facility] Care Management [phone]

## 2019-11-08 ENCOUNTER — Ambulatory Visit: Payer: Medicare HMO | Admitting: *Deleted

## 2019-11-08 ENCOUNTER — Telehealth: Payer: Self-pay | Admitting: *Deleted

## 2019-11-08 DIAGNOSIS — I10 Essential (primary) hypertension: Secondary | ICD-10-CM

## 2019-11-08 NOTE — Progress Notes (Signed)
"  I have reviewed this encounter including the documentation in this note and/or discussed this patient with [name] . I am certifying that I agree with the content of this note as supervising physician." [place], FNP

## 2019-11-08 NOTE — Telephone Encounter (Signed)
Tried to reach out to patient no answer and she does not have mail box set up

## 2019-11-08 NOTE — Patient Instructions (Signed)
Visit Information  Goals Addressed            This Visit's Progress   . Blood Pressure Management       CARE PLAN ENTRY (see longtitudinal plan of care for additional care plan information)  Current Barriers:  . Chronic Disease Management support and education needs related to hypertension  Nurse Case Manager Clinical Goal(s):  [name] Over the next 30 days, patient will work with PCP to address needs related to hypertension  and medication management  Interventions:  . Consulted by Theadore Nan, [name] regarding patient's appointment with [name], FNP on 11/06/19 o Patient came in for appointment but was turned away because she answered "yes" to covid screening question regarding diarrhea. Patient was upset with staff and her appointment has not rescheduled for a telephone visit. Geanie Cooley with [place] front office manager regarding Covid Publishing rights manager . Consulted with [facility] Triage nurse regarding telephone call from Theadore Nan, [name] yesterday after he talked with patient by telephone . Reached out to patient today to discuss incident and provide reassurance. Unable to speak with her and there is no VM. [name] Sent telephone encounter to [facility] clinical staff requesting that they contact Ms Dales to discuss her concerns that her blood pressure medication is causing diarrhea and to reschedule her visit with [name].   . Previously reviewed medications with patient and discussed lisinopril/HCTZ 20/12.5 dose and directions . Chart reviewed including office notes, lab results, and referrals o Patient referred to cardiology, Dr [name], but they were unable to reach her to schedule an appointment. Letter was mailed requesting that she call them to schedule an appt. . RN will reach out to patient by telephone later this month as scheduled  Patient Self Care Activities:  . Performs ADL's independently . Performs IADL's independently  Please see past updates related to this goal  by clicking on the "Past Updates" button in the selected goal         Follow-up Plan The care management team will reach out to the patient again over the next 30 days.   [name], BSN, RN-BC Embedded Chronic Care Manager [facility] / [place] Management Direct Dial: [phone]

## 2019-11-08 NOTE — Telephone Encounter (Signed)
11/08/2019  I was consulted yesterday by Theadore Nan, [name] who spoke with Ms Laurann Montana by telephone. She was upset because she was turned away when she came in for her visit with [name] on 4/12 because she answered "yes" to the covid screening question regarding diarrhea. She relayed that she believes her blood pressure medicine is the cause of her diarrhea. Scott left a telephone message with [facility] switchboard yesterday requesting that triage reach out to Ms Coryell to discuss her concerns. There is no documentation of a telephone call in [facility] and her appointment has not been rescheduled.   I reached out to Ms Sonnenfeld today at the request of the [name] to basically provide support since she was so upset yesterday.   Forwarding to [facility] Clinical Staff so that they can follow-up with her on her complaints of diarrhea and get her rescheduled with [name], FNP.  I will follow-up with her later this month as scheduled.   [name], BSN, RN-BC Embedded Chronic Care Manager [facility] / [place] Management Direct Dial: [phone]

## 2019-11-08 NOTE — Chronic Care Management (AMB) (Signed)
Chronic Care Management   Follow Up Note   11/08/2019 Name: [name] MRN: [id number] DOB: [birth year]  Referred by: [name], FNP Reason for referral : Chronic Care Management (RN Follow up )   [name] is a 64 y.o. year old female who is a primary care patient of [name], [place]. The CCM team was consulted for assistance with chronic disease management and care coordination needs.    Review of patient status, including review of consultants reports, relevant laboratory and other test results, and collaboration with appropriate care team members and the patient's provider was performed as part of comprehensive patient evaluation and provision of chronic care management services.    SDOH (Social Determinants of Health) assessments performed: No See Care Plan activities for detailed interventions related to [place])    Unsuccessful outreach for telephone follow-up today.    Outpatient Encounter Medications as of 11/08/2019  Medication Sig  . albuterol (PROVENTIL HFA;VENTOLIN HFA) 108 (90 Base) MCG/ACT inhaler Inhale 2 puffs into the lungs every 6 (six) hours as needed for wheezing or shortness of breath.  . ALPRAZolam (XANAX) 1 MG tablet Take 1 tablet (1 mg total) by mouth 3 (three) times daily as needed for anxiety.  . ARIPiprazole (ABILIFY) 2 MG tablet Take 1 tablet (2 mg total) by mouth daily.  [name] aspirin 325 MG tablet Take 325 mg by mouth daily.    [name] atorvastatin (LIPITOR) 80 MG tablet Take 1 tablet (80 mg total) by mouth daily. (Needs to be seen before next refill)  . blood glucose meter kit and supplies KIT Dispense based on patient and insurance preference. Use up to four times daily as directed. (FOR ICD-9 250.00, 250.01).  . Blood Glucose Monitoring Suppl (BLOOD GLUCOSE MONITOR SYSTEM) w/Device KIT 1 Device by Does not apply route 2 (two) times daily.  . Cholecalciferol (VITAMIN D3) 5000 units TABS Take 1 tablet by mouth daily.  [name] escitalopram  (LEXAPRO) 20 MG tablet Take 1 tablet (20 mg total) by mouth daily.  . fish oil-omega-3 fatty acids 1000 MG capsule Take 2 g by mouth 2 (two) times daily.   . fluticasone furoate-vilanterol (BREO ELLIPTA) 100-25 MCG/INH AEPB Inhale 1 puff into the lungs daily.  [name] glucose blood (IGLUCOSE TEST STRIPS) test strip Use to check blood sugars twice daily Dx E11.9  . hydrOXYzine (VISTARIL) 25 MG capsule Take 1 capsule (25 mg total) by mouth 3 (three) times daily as needed.  . isosorbide mononitrate (IMDUR) 30 MG 24 hr tablet Take 0.5 tablets (15 mg total) by mouth daily.  . Lancets MISC Use to check blood sugars twice daily ICD 10 E11.9  . lisinopril-hydrochlorothiazide (ZESTORETIC) 20-12.5 MG tablet Take 2 tablets by mouth daily.  . metFORMIN (GLUCOPHAGE) 500 MG tablet Take 1 tablet (500 mg total) by mouth 2 (two) times daily with a meal.  . metoprolol tartrate (LOPRESSOR) 25 MG tablet Take 1 tablet (25 mg total) by mouth 2 (two) times daily.  . pramipexole (MIRAPEX) 0.5 MG tablet Take 1 tablet (0.5 mg total) by mouth 2 (two) times daily.  . vitamin B-12 (CYANOCOBALAMIN) 1000 MCG tablet Take 1,000 mcg by mouth daily.   No facility-administered encounter medications on file as of 11/08/2019.     RN Care Plan   . Blood Pressure Management       CARE PLAN ENTRY (see longtitudinal plan of care for additional care plan information)  Current Barriers:  . Chronic Disease Management support and education needs related to hypertension  Nurse Case Manager Clinical Goal(s):  [name] Over the next 30 days, patient will work with PCP to address needs related to hypertension  and medication management  Interventions:  . Consulted by Theadore Nan, [name] regarding patient's appointment with [name], FNP on 11/06/19 o Patient came in for appointment but was turned away because she answered "yes" to covid screening question regarding diarrhea. Patient was upset with staff and her appointment has not  rescheduled for a telephone visit. Geanie Cooley with [place] front office manager regarding Covid Publishing rights manager . Consulted with [facility] Triage nurse regarding telephone call from Theadore Nan, [name] yesterday after he talked with patient by telephone . Reached out to patient today to discuss incident and provide reassurance. Unable to speak with her and there is no VM. [name] Sent telephone encounter to [facility] clinical staff requesting that they contact Ms Kellogg to discuss her concerns that her blood pressure medication is causing diarrhea and to reschedule her visit with [name].   . Previously reviewed medications with patient and discussed lisinopril/HCTZ 20/12.5 dose and directions . Chart reviewed including office notes, lab results, and referrals o Patient referred to cardiology, Dr [name], but they were unable to reach her to schedule an appointment. Letter was mailed requesting that she call them to schedule an appt. . RN will reach out to patient by telephone later this month as scheduled  Patient Self Care Activities:  . Performs ADL's independently . Performs IADL's independently  Please see past updates related to this goal by clicking on the "Past Updates" button in the selected goal          Plan:   The care management team will reach out to the patient again over the next 30 days.    [name], BSN, RN-BC Embedded Chronic Care Manager [facility] / [place] Management Direct Dial: [phone]

## 2019-11-17 ENCOUNTER — Telehealth: Payer: Self-pay | Admitting: *Deleted

## 2019-11-17 ENCOUNTER — Ambulatory Visit: Payer: Medicare HMO | Admitting: *Deleted

## 2019-11-17 DIAGNOSIS — E785 Hyperlipidemia, unspecified: Secondary | ICD-10-CM

## 2019-11-17 DIAGNOSIS — I2510 Atherosclerotic heart disease of native coronary artery without angina pectoris: Secondary | ICD-10-CM | POA: Diagnosis not present

## 2019-11-17 DIAGNOSIS — I10 Essential (primary) hypertension: Secondary | ICD-10-CM | POA: Diagnosis not present

## 2019-11-17 DIAGNOSIS — I2583 Coronary atherosclerosis due to lipid rich plaque: Secondary | ICD-10-CM | POA: Diagnosis not present

## 2019-11-17 DIAGNOSIS — J449 Chronic obstructive pulmonary disease, unspecified: Secondary | ICD-10-CM

## 2019-11-17 NOTE — Telephone Encounter (Signed)
11/17/2019  [name] was not able to complete her previous appointment with [name] because she answered "yes" to one of the covid screening questions and was told she would need to reschedule. Please reach out to Tracy Skufca to reschedule this appointment for a follow-up and lab work. She would also like to discuss the mask policy because she is unable to wear it for extended periods of time due to COPD and anxiety.   [name], BSN, RN-BC Embedded Chronic Care Manager [facility] / [place] Management Direct Dial: [phone]

## 2019-11-17 NOTE — Telephone Encounter (Signed)
Patient has a follow up appointment scheduled. 

## 2019-11-17 NOTE — Patient Instructions (Signed)
Visit Information  Goals Addressed            This Visit's Progress     Patient Stated   . "I need to reschedule my appointment with my PCP" (pt-stated)        [place] (see longitudinal plan of care for additional care plan information)  Current Barriers:  . Unable to see PCP for last appt because she answered "yes" to one of the covid screening questions  Nurse Case Manager Clinical Goal(s):  [name] Over the next 3 days, patient will have follow-up with PCP reascheduled  Interventions:  . Inter-disciplinary care team collaboration (see longitudinal plan of care) . Chart reviewed . Talked with patient by telephone . Previously collaborated with [facility] front office staff regarding cancellation of appointment . Discussed office mask policy and Covid screening questions . Previously sent request to [facility] clinical staff asking them to reach out to the patient to reschedule her appointment. Outreach was unsuccessful.  [name] 2nd request to [facility] clinical staff today asking that they reach out to [name] today to reschedule her follow-up appointment.  . Encouraged patient to reach out to [facility] team as needed  Patient Self Care Activities:  . Performs ADL's independently . Performs IADL's independently  Initial goal documentation       Other   . Blood Pressure Management       CARE PLAN ENTRY (see longtitudinal plan of care for additional care plan information)  Current Barriers:  . Chronic Disease Management support and education needs related to hypertension  Nurse Case Manager Clinical Goal(s):  [name] Over the next 30 days, patient will work with PCP to address needs related to hypertension  and medication management  Interventions:  . Chart reviewed including office notes, lab results, and referrals o Patient referred to cardiology, Dr [name], but they were unable to reach her to schedule an appointment. Letter was mailed requesting that she call them to schedule an  appt. . Reviewed medications with patient and discussed lisinopril/HCTZ 20/12.5 dose and directions o She is taking 1/2 dose and diarrhea has resolved . Discussed home blood pressure readings o Most have been within recommended range . Encouraged patient to check blood pressure daily and to reach out to PCP with readings outside of recommended range . RN will reach out to patient by telephone later this month as scheduled  Patient Self Care Activities:  . Performs ADL's independently . Performs IADL's independently  Please see past updates related to this goal by clicking on the "Past Updates" button in the selected goal         The patient verbalized understanding of instructions provided today and declined a print copy of patient instruction materials.   Follow-up Plan The care management team will reach out to the patient again over the next 30 days.   [name], BSN, RN-BC Embedded Chronic Care Manager [facility] / [place] Management Direct Dial: [phone]

## 2019-11-17 NOTE — Chronic Care Management (AMB) (Signed)
Chronic Care Management   Follow Up Note   11/17/2019 Name: [name] MRN: [id number] DOB: 02/20/1956  Referred by: [name], FNP Reason for referral : Chronic Care Management (RN Follow up)   [name] is a 64 y.o. year old female who is a primary care patient of [name], [place]. The CCM team was consulted for assistance with chronic disease management and care coordination needs.    Review of patient status, including review of consultants reports, relevant laboratory and other test results, and collaboration with appropriate care team members and the patient's provider was performed as part of comprehensive patient evaluation and provision of chronic care management services.    SDOH (Social Determinants of Health) assessments performed: No See Care Plan activities for detailed interventions related to [facility])    I spoke with [name] by telephone today regarding blood pressure management and her need to f/u with PCP in the office.   Outpatient Encounter Medications as of 11/17/2019  Medication Sig  . albuterol (PROVENTIL HFA;VENTOLIN HFA) 108 (90 Base) MCG/ACT inhaler Inhale 2 puffs into the lungs every 6 (six) hours as needed for wheezing or shortness of breath.  . ALPRAZolam (XANAX) 1 MG tablet Take 1 tablet (1 mg total) by mouth 3 (three) times daily as needed for anxiety.  . ARIPiprazole (ABILIFY) 2 MG tablet Take 1 tablet (2 mg total) by mouth daily.  [name] aspirin 325 MG tablet Take 325 mg by mouth daily.    [name] atorvastatin (LIPITOR) 80 MG tablet Take 1 tablet (80 mg total) by mouth daily. (Needs to be seen before next refill)  . blood glucose meter kit and supplies KIT Dispense based on patient and insurance preference. Use up to four times daily as directed. (FOR ICD-9 250.00, 250.01).  . Blood Glucose Monitoring Suppl (BLOOD GLUCOSE MONITOR SYSTEM) w/Device KIT 1 Device by Does not apply route 2 (two) times daily.  . Cholecalciferol (VITAMIN D3)  5000 units TABS Take 1 tablet by mouth daily.  [name] escitalopram (LEXAPRO) 20 MG tablet Take 1 tablet (20 mg total) by mouth daily.  . fish oil-omega-3 fatty acids 1000 MG capsule Take 2 g by mouth 2 (two) times daily.   . fluticasone furoate-vilanterol (BREO ELLIPTA) 100-25 MCG/INH AEPB Inhale 1 puff into the lungs daily.  [name] glucose blood (IGLUCOSE TEST STRIPS) test strip Use to check blood sugars twice daily Dx E11.9  . hydrOXYzine (VISTARIL) 25 MG capsule Take 1 capsule (25 mg total) by mouth 3 (three) times daily as needed.  . isosorbide mononitrate (IMDUR) 30 MG 24 hr tablet Take 0.5 tablets (15 mg total) by mouth daily.  . Lancets MISC Use to check blood sugars twice daily ICD 10 E11.9  . lisinopril-hydrochlorothiazide (ZESTORETIC) 20-12.5 MG tablet Take 2 tablets by mouth daily.  . metFORMIN (GLUCOPHAGE) 500 MG tablet Take 1 tablet (500 mg total) by mouth 2 (two) times daily with a meal.  . metoprolol tartrate (LOPRESSOR) 25 MG tablet Take 1 tablet (25 mg total) by mouth 2 (two) times daily.  . pramipexole (MIRAPEX) 0.5 MG tablet Take 1 tablet (0.5 mg total) by mouth 2 (two) times daily.  . vitamin B-12 (CYANOCOBALAMIN) 1000 MCG tablet Take 1,000 mcg by mouth daily.   No facility-administered encounter medications on file as of 11/17/2019.    Goals Addressed    . "I need to reschedule my appointment with my PCP" (pt-stated)        [place] (see longitudinal plan of care  for additional care plan information)  Current Barriers:  . Unable to see PCP for last appt because she answered "yes" to one of the covid screening questions  Nurse Case Manager Clinical Goal(s):  [name] Over the next 3 days, patient will have follow-up with PCP reascheduled  Interventions:  . Inter-disciplinary care team collaboration (see longitudinal plan of care) . Chart reviewed . Talked with patient by telephone . Previously collaborated with [facility] front office staff regarding cancellation of  appointment . Discussed office mask policy and Covid screening questions . Previously sent request to [facility] clinical staff asking them to reach out to the patient to reschedule her appointment. Outreach was unsuccessful.  [name] 2nd request to [facility] clinical staff today asking that they reach out to [name] today to reschedule her follow-up appointment.  . Encouraged patient to reach out to [facility] team as needed  Patient Self Care Activities:  . Performs ADL's independently . Performs IADL's independently  Initial goal documentation     Other   . Blood Pressure Management       CARE PLAN ENTRY (see longtitudinal plan of care for additional care plan information)  Current Barriers:  . Chronic Disease Management support and education needs related to hypertension  Nurse Case Manager Clinical Goal(s):  [name] Over the next 30 days, patient will work with PCP to address needs related to hypertension  and medication management  Interventions:  . Chart reviewed including office notes, lab results, and referrals o Patient referred to cardiology, Dr [name], but they were unable to reach her to schedule an appointment. Letter was mailed requesting that she call them to schedule an appt. . Reviewed medications with patient and discussed lisinopril/HCTZ 20/12.5 dose and directions o She is taking 1/2 dose and diarrhea has resolved . Discussed home blood pressure readings o Most have been within recommended range . Encouraged patient to check blood pressure daily and to reach out to PCP with readings outside of recommended range . RN will reach out to patient by telephone later this month as scheduled  Patient Self Care Activities:  . Performs ADL's independently . Performs IADL's independently  Please see past updates related to this goal by clicking on the "Past Updates" button in the selected goal          Plan:   The care management team will reach out to the patient again over the  next 30 days.   [name], BSN, RN-BC Embedded Chronic Care Manager [facility] [place] [facility] Direct Dial: [phone]

## 2019-11-29 ENCOUNTER — Ambulatory Visit: Payer: Self-pay | Admitting: Licensed Clinical Social Worker

## 2019-11-29 DIAGNOSIS — I2583 Coronary atherosclerosis due to lipid rich plaque: Secondary | ICD-10-CM

## 2019-11-29 DIAGNOSIS — I10 Essential (primary) hypertension: Secondary | ICD-10-CM

## 2019-11-29 DIAGNOSIS — J449 Chronic obstructive pulmonary disease, unspecified: Secondary | ICD-10-CM

## 2019-11-29 DIAGNOSIS — F411 Generalized anxiety disorder: Secondary | ICD-10-CM

## 2019-11-29 DIAGNOSIS — E785 Hyperlipidemia, unspecified: Secondary | ICD-10-CM

## 2019-11-29 DIAGNOSIS — I2510 Atherosclerotic heart disease of native coronary artery without angina pectoris: Secondary | ICD-10-CM

## 2019-11-29 NOTE — Chronic Care Management (AMB) (Signed)
Chronic Care Management    Clinical Social Work Follow Up Note  11/29/2019 Name: [name] MRN: [id number] DOB: 12-31-1955  [name] is a 64 y.o. year old female who is a primary care patient of [name], [place]. The CCM team was consulted for assistance with [name] .   Review of patient status, including review of consultants reports, other relevant assessments, and collaboration with appropriate care team members and the patient's provider was performed as part of comprehensive patient evaluation and provision of chronic care management services.    SDOH (Social Determinants of Health) assessments performed: No;risk for tobacco use; risk for depression; risk for food insecurity    Office Visit from 08/07/2019 in [place]  PHQ-9 Total Score  22      GAD 7 : Generalized Anxiety Score 09/05/2019 08/07/2019 05/16/2019 03/08/2019  Nervous, Anxious, on Edge 1 3 2 1   Control/stop worrying 1 3 3 2   Worry too much - different things 1 3 3 1   Trouble relaxing 0 2 2 1   Restless 0 1 2 1   Easily annoyed or irritable 0 3 1 0  Afraid - awful might happen 0 1 3 1   Total GAD 7 Score 3 16 16 7   Anxiety Difficulty Not difficult at all Very difficult - Somewhat difficult    Outpatient Encounter Medications as of 11/29/2019  Medication Sig  . albuterol (PROVENTIL HFA;VENTOLIN HFA) 108 (90 Base) MCG/ACT inhaler Inhale 2 puffs into the lungs every 6 (six) hours as needed for wheezing or shortness of breath.  . ALPRAZolam (XANAX) 1 MG tablet Take 1 tablet (1 mg total) by mouth 3 (three) times daily as needed for anxiety.  . ARIPiprazole (ABILIFY) 2 MG tablet Take 1 tablet (2 mg total) by mouth daily.  [name] aspirin 325 MG tablet Take 325 mg by mouth daily.    [name] atorvastatin (LIPITOR) 80 MG tablet Take 1 tablet (80 mg total) by mouth daily. (Needs to be seen before next refill)  . blood glucose meter kit and supplies KIT Dispense based on patient and  insurance preference. Use up to four times daily as directed. (FOR ICD-9 250.00, 250.01).  . Blood Glucose Monitoring Suppl (BLOOD GLUCOSE MONITOR SYSTEM) w/Device KIT 1 Device by Does not apply route 2 (two) times daily.  . Cholecalciferol (VITAMIN D3) 5000 units TABS Take 1 tablet by mouth daily.  [name] escitalopram (LEXAPRO) 20 MG tablet Take 1 tablet (20 mg total) by mouth daily.  . fish oil-omega-3 fatty acids 1000 MG capsule Take 2 g by mouth 2 (two) times daily.   . fluticasone furoate-vilanterol (BREO ELLIPTA) 100-25 MCG/INH AEPB Inhale 1 puff into the lungs daily.  [name] glucose blood (IGLUCOSE TEST STRIPS) test strip Use to check blood sugars twice daily Dx E11.9  . hydrOXYzine (VISTARIL) 25 MG capsule Take 1 capsule (25 mg total) by mouth 3 (three) times daily as needed.  . isosorbide mononitrate (IMDUR) 30 MG 24 hr tablet Take 0.5 tablets (15 mg total) by mouth daily.  . Lancets MISC Use to check blood sugars twice daily ICD 10 E11.9  . lisinopril-hydrochlorothiazide (ZESTORETIC) 20-12.5 MG tablet Take 2 tablets by mouth daily.  . metFORMIN (GLUCOPHAGE) 500 MG tablet Take 1 tablet (500 mg total) by mouth 2 (two) times daily with a meal.  . metoprolol tartrate (LOPRESSOR) 25 MG tablet Take 1 tablet (25 mg total) by mouth 2 (two) times daily.  . pramipexole (MIRAPEX) 0.5 MG tablet Take 1 tablet (0.5  mg total) by mouth 2 (two) times daily.  . vitamin B-12 (CYANOCOBALAMIN) 1000 MCG tablet Take 1,000 mcg by mouth daily.   No facility-administered encounter medications on file as of 11/29/2019.      [name] called client number several times today and was not able to speak via phone with client. [name] was not able to leave client phone message since client voice mail  was not set up. [name] also called phone number for [name] of client. [name] was not able to speak with [name] via phone today; however, [name] did leave phone message for [name] today requesting she return call to [name]  at [phone] to discuss client needs  Follow Up Plan: [name] to call client in next 4 weeks to assess the psychosocial needs of client at that time  [name].Kaylamarie Swickard MSW, [name] Licensed Clinical Social Worker [facility] Care Management [phone]

## 2019-11-29 NOTE — Patient Instructions (Addendum)
Licensed Clinical Social Worker Visit Information  Materials Provided: No  11/29/2019  Name: [name] MRN: [id number] DOB: 18-Oct-1955   [name] is a 64 y.o. year old female who is a primary care patient of [name], [place]. The CCM team was consulted for assistance with [name] .   Review of patient status, including review of consultants reports, other relevant assessments, and collaboration with appropriate care team members and the patient's provider was performed as part of comprehensive patient evaluation and provision of chronic care management services.   SDOH (Social Determinants of Health) assessments performed: No;risk for tobacco use; risk for depression; risk for food insecurity   [name] called client number several times today and was not able to speak via phone with client. [name] was not able to leave client phone message since client voice mail  was not set up. [name] also called phone number for [name] of client. [name] was not able to speak with [name] via phone today; however, [name] did leave phone message for [name] today requesting she return call to [name] at [phone] to discuss client needs  Follow Up Plan: [name] to call client in next 4 weeks to assess the psychosocial needs of client at that time  [name] was not able to speak via phone with patient today. Thus, the patient was not able to verbalize understanding of instructions provided today and was not able to accept or decline a print copy of patient instruction materials.   [name].Freddi Forster MSW, [name] Licensed Clinical Social Worker [facility] Care Management [phone]

## 2019-11-30 ENCOUNTER — Ambulatory Visit: Payer: Medicare HMO | Admitting: Nurse Practitioner

## 2019-12-05 ENCOUNTER — Encounter: Payer: Self-pay | Admitting: Nurse Practitioner

## 2019-12-11 ENCOUNTER — Ambulatory Visit (INDEPENDENT_AMBULATORY_CARE_PROVIDER_SITE_OTHER): Payer: Medicare HMO | Admitting: *Deleted

## 2019-12-11 DIAGNOSIS — Z0000 Encounter for general adult medical examination without abnormal findings: Secondary | ICD-10-CM

## 2019-12-11 NOTE — Progress Notes (Addendum)
MEDICARE ANNUAL WELLNESS VISIT  12/11/2019  Telephone Visit Disclaimer This Medicare AWV was conducted by telephone due to national recommendations for restrictions regarding the COVID-19 Pandemic (e.g. social distancing).  I verified, using two identifiers, that I am speaking with [name] or their authorized healthcare agent. I discussed the limitations, risks, security, and privacy concerns of performing an evaluation and management service by telephone and the potential availability of an in-person appointment in the future. The patient expressed understanding and agreed to proceed.   Subjective:  [name] is a 64 y.o. female patient of [name], [place] who had a Medicare Annual Wellness Visit today via telephone. [name] is disabled and lives alone. She has 1daughter. She reports that she is socially active and does interact with friends/family regularly. She is not physically active and enjoys watching TV.  Patient Care Team: [name], FNP as PCP - General (Family Medicine) Nat Christen, MD as Attending Physician (Optometry) Ilean China, RN as Case Manager Shea Evans [name], [name] as Social Worker (Licensed Clinical Social Worker)  Advanced Directives 12/11/2019 02/11/2018 08/10/2017 07/16/2017 01/29/2017 04/18/2015 07/24/2011  Does Patient Have a Medical Advance Directive? No Yes Yes Yes No No Patient does not have advance directive;Patient would like information  Type of Advance Directive - [place];Living will [place];Living will Living will - - -  Does patient want to make changes to medical advance directive? - - No - Patient declined No - Patient declined - - -  Copy of [place] in Chart? - No - copy requested No - copy requested - - - -  Would patient like information on creating a medical advance directive? No - Patient declined - - - Yes (MAU/Ambulatory/Procedural Areas -  Information given) Yes - Educational materials given Advance directive packet given  Pre-existing out of facility DNR order (yellow form or pink MOST form) - - - - - - -    Hospital Utilization Over the Past 12 Months: # of hospitalizations or ER visits: 0 # of surgeries: 0  Review of Systems    Patient reports that her overall health is unchanged compared to last year.  History obtained from the patient and patient chart.   Patient Reported Readings (BP, Pulse, CBG, Weight, etc) none  Pain Assessment Pain : No/denies pain     Current Medications & Allergies (verified) Allergies as of 12/11/2019       Reactions   Food Other (See Comments)   Nutmeg= difficulty breathing   Nutmeg Oil (myristica Oil) Hives   Other Other (See Comments)   Nutmeg= difficulty breathing        Medication List        Accurate as of Dec 11, 2019 10:16 AM. If you have any questions, ask your nurse or doctor.          albuterol 108 (90 Base) MCG/ACT inhaler Commonly known as: VENTOLIN HFA Inhale 2 puffs into the lungs every 6 (six) hours as needed for wheezing or shortness of breath.   ALPRAZolam 1 MG tablet Commonly known as: XANAX Take 1 tablet (1 mg total) by mouth 3 (three) times daily as needed for anxiety.   ARIPiprazole 2 MG tablet Commonly known as: Abilify Take 1 tablet (2 mg total) by mouth daily.   aspirin 325 MG tablet Take 325 mg by mouth daily.   atorvastatin 80 MG tablet Commonly known as: LIPITOR Take 1 tablet (80 mg  total) by mouth daily. (Needs to be seen before next refill)   blood glucose meter kit and supplies Kit Dispense based on patient and insurance preference. Use up to four times daily as directed. (FOR ICD-9 250.00, 250.01).   Blood Glucose Monitor System w/Device Kit 1 Device by Does not apply route 2 (two) times daily.   Breo Ellipta 100-25 MCG/INH Aepb Generic drug: fluticasone furoate-vilanterol Inhale 1 puff into the lungs daily.     escitalopram 20 MG tablet Commonly known as: Lexapro Take 1 tablet (20 mg total) by mouth daily.   fish oil-omega-3 fatty acids 1000 MG capsule Take 2 g by mouth 2 (two) times daily.   hydrOXYzine 25 MG capsule Commonly known as: Vistaril Take 1 capsule (25 mg total) by mouth 3 (three) times daily as needed.   IGlucose Test Strips test strip Generic drug: glucose blood Use to check blood sugars twice daily Dx E11.9   isosorbide mononitrate 30 MG 24 hr tablet Commonly known as: IMDUR Take 0.5 tablets (15 mg total) by mouth daily.   Lancets Misc Use to check blood sugars twice daily ICD 10 E11.9   lisinopril-hydrochlorothiazide 20-12.5 MG tablet Commonly known as: Zestoretic Take 2 tablets by mouth daily. What changed: how much to take   metFORMIN 500 MG tablet Commonly known as: GLUCOPHAGE Take 1 tablet (500 mg total) by mouth 2 (two) times daily with a meal.   metoprolol tartrate 25 MG tablet Commonly known as: LOPRESSOR Take 1 tablet (25 mg total) by mouth 2 (two) times daily.   pramipexole 0.5 MG tablet Commonly known as: MIRAPEX Take 1 tablet (0.5 mg total) by mouth 2 (two) times daily.   vitamin B-12 1000 MCG tablet Commonly known as: CYANOCOBALAMIN Take 1,000 mcg by mouth daily.   Vitamin D3 125 MCG (5000 UT) Tabs Take 1 tablet by mouth daily.        History (reviewed): Past Medical History:  Diagnosis Date   Anxiety    Breast cancer ([place]) 06/30/11   inv ductal, ER/PR +, her-2 -   CAD (coronary artery disease)    NSTEMI 4/08: OM3 occluded (PCI unsuccessful), dRCA 95% => BMS, inf HK, EF 50%;   b. MV 11/12:  [place] ischemia => c.  [place] 11/12:  dLAD 70%, OM1 70-80%, then occluded (no change from 2008), dOM filled L->L collats, mRCA stent occluded, dRCA filled L->R collats, EF 55-65% => med Rx (consider PCI of RCA if refractory angina)   Carotid stenosis    dopplers 5/08: 0-30% bilateral; 10/12: 0-39% B/L ICA => f/u 04/2013   Chronic kidney disease    COPD  (chronic obstructive pulmonary disease) (HCC)    Depression    Depression with anxiety    GERD (gastroesophageal reflux disease)    OTC acid reducer prn   Headache(784.0)    sinus; occ. migraines   Hx of radiation therapy 08/24/11 to 10/07/11   L breast   Hyperlipidemia    Hypertension    under control; has been on med. x 4 yrs.   Myocardial infarction ([place])    Stable angina ([place])    a. med Rx after cath 05/2011   Stress incontinence, female    Past Surgical History:  Procedure Laterality Date   AXILLARY LYMPH NODE DISSECTION  07/31/2011   Procedure: AXILLARY LYMPH NODE DISSECTION;  Surgeon: [name], MD;  Location: [facility];  Service: General;  Laterality: Left;  left axillary sentinal node biopsy   BREAST SURGERY  CARDIAC CATHETERIZATION  11/02/2006; 06/03/2011   CESAREAN SECTION  1987   CORONARY STENT PLACEMENT  11/02/2006   ELBOW SURGERY     right   MASTECTOMY PARTIAL / LUMPECTOMY  06/30/2011   left   Family History  Problem Relation Age of Onset   Heart disease Mother    Heart disease Father    Cancer Maternal Aunt        pt unaware of what kind   Cancer Maternal Grandmother        ovarian   Heart attack Sister    Thyroid disease Daughter    Anxiety disorder Daughter    Depression Daughter    Bipolar disorder Sister    Social History   Socioeconomic History   Marital status: Single    Spouse name: Not on file   Number of children: 1   Years of education: 12+   Highest education level: Some college, no degree  Occupational History   Not on file  Tobacco Use   Smoking status: Current Every Day Smoker    Packs/day: 1.00    Years: 45.00    Pack years: 45.00    Types: Cigarettes   Smokeless tobacco: Never Used   Tobacco comment: on and off  Substance and Sexual Activity   Alcohol use: Yes    Alcohol/week: 1.0 standard drinks    Types: 1 Glasses of wine per week    Comment: occasionally   Drug use: No   Sexual activity: Never     Birth control/protection: Post-menopausal  Other Topics Concern   Not on file  Social History Narrative   Lives at home alone. She has a daughter and grandchildren that she interacts with often.    Social Determinants of Health   Financial Resource Strain: Low Risk    Difficulty of Paying Living Expenses: Not very hard  Food Insecurity: Food Insecurity Present   Worried About Charity fundraiser in the Last Year: Sometimes true   Arboriculturist in the Last Year: Sometimes true  Transportation Needs:    Film/video editor (Medical):    Lack of Transportation (Non-Medical):   Physical Activity:    Days of Exercise per Week:    Minutes of Exercise per Session:   Stress:    Feeling of Stress :   Social Connections:    Frequency of Communication with Friends and Family:    Frequency of Social Gatherings with Friends and Family:    Attends Religious Services:    Active Member of Clubs or Organizations:    Attends Archivist Meetings:    Marital Status:     Activities of Daily Living In your present state of health, do you have any difficulty performing the following activities: 12/11/2019  Hearing? Y  Comment ringing in ears  Vision? N  Difficulty concentrating or making decisions? Y  Comment concentrating  Walking or climbing stairs? Y  Comment climbing stairs  Dressing or bathing? N  Doing errands, shopping? N  Preparing Food and eating ? N  Using the Toilet? N  In the past six months, have you accidently leaked urine? Y  Comment urgency  Do you have problems with loss of bowel control? N  Managing your Finances? N  Housekeeping or managing your Housekeeping? Y  Some recent data might be hidden    Patient Education/ Literacy How often do you need to have someone help you when you read instructions, pamphlets, or other written materials from your doctor  or pharmacy?: 1 - Never What is the last grade level you completed in school?: 12+  Exercise Current  Exercise Habits: The patient does not participate in regular exercise at present, Exercise limited by: respiratory conditions(s)(copd)  Diet Patient reports consuming 2 meals a day and 0 snack(s) a day Patient reports that her primary diet is: Regular Patient reports that she does have regular access to food.   Depression Screen PHQ 2/9 Scores 12/11/2019 08/07/2019 05/16/2019 04/25/2019 03/13/2019 03/08/2019 02/13/2019  PHQ - 2 Score 6 6 6  0 4 2 4   PHQ- 9 Score 22 22 22  - 13 8 9      Fall Risk Fall Risk  12/11/2019 05/16/2019 04/25/2019 02/13/2019 08/12/2018  Falls in the past year? 0 0 0 0 0  Number falls in past yr: - - - - -  Injury with Fall? - - - - -  Risk for fall due to : - - - - -  Follow up - - - - -     Objective:  [name] seemed alert and oriented and she participated appropriately during our telephone visit.  Blood Pressure Weight BMI  BP Readings from Last 3 Encounters:  05/16/19 (!) 175/86  04/25/19 (!) 155/81  08/12/18 (!) 156/76   Wt Readings from Last 3 Encounters:  05/16/19 220 lb (99.8 kg)  04/25/19 213 lb (96.6 kg)  08/12/18 209 lb (94.8 kg)   BMI Readings from Last 1 Encounters:  05/16/19 38.97 kg/m    *Unable to obtain current vital signs, weight, and BMI due to telephone visit type  Hearing/Vision  Annaliz did not seem to have difficulty with hearing/understanding during the telephone conversation Reports that she has had a formal eye exam by an eye care professional within the past year Reports that she has not had a formal hearing evaluation within the past year *Unable to fully assess hearing and vision during telephone visit type  Cognitive Function: 6CIT Screen 12/11/2019  What Year? 0 points  What month? 0 points  What time? 0 points  Count back from 20 0 points  Months in reverse 0 points  Repeat phrase 0 points  Total Score 0   (Normal:0-7, Significant for Dysfunction: >8)  Normal Cognitive Function Screening:yes  Immunization &  Health Maintenance Record Immunization History  Administered Date(s) Administered   Tdap 05/17/2001    Health Maintenance  Topic Date Due   PAP SMEAR-Modifier  05/24/2004   MAMMOGRAM  12/22/2014   LIPID PANEL  08/16/2019   HEMOGLOBIN A1C  11/14/2019   COVID-19 Vaccine (1) 12/27/2019 (Originally 08/22/1971)   COLONOSCOPY  05/15/2020 (Originally 02/14/2017)   TETANUS/TDAP  05/15/2020 (Originally 05/18/2011)   PNEUMOCOCCAL POLYSACCHARIDE VACCINE AGE 20-64 HIGH RISK  12/10/2020 (Originally 08/22/1957)   INFLUENZA VACCINE  02/25/2020   OPHTHALMOLOGY EXAM  03/20/2020   FOOT EXAM  09/04/2020   Hepatitis C Screening  Completed   HIV Screening  Completed       Assessment  This is a routine wellness examination for [employer].  Health Maintenance: Due or Overdue Health Maintenance Due  Topic Date Due   PAP SMEAR-Modifier  05/24/2004   MAMMOGRAM  12/22/2014   LIPID PANEL  08/16/2019   HEMOGLOBIN A1C  11/14/2019    [name] does not need a referral for Community Assistance: Care Management:   no Social Work:    no Prescription Assistance:  no Nutrition/Diabetes Education:  no   Plan:  Personalized Goals Goals Addressed  This Visit's Progress    Patient Stated       12/11/2019 AWV Goal: Keep All Scheduled Appointments  Over the next year, patient will attend all scheduled appointments with their PCP and any specialists that they see.        Personalized Health Maintenance & Screening Recommendations  Screening mammography, Pap Lung Cancer Screening Recommended: yes (Low Dose CT Chest recommended if Age 28-80 years, 30 pack-year currently smoking OR have quit w/in past 15 years) Hepatitis C Screening recommended: no HIV Screening recommended: no  Advanced Directives: Written information was not prepared per patient's request.  Referrals & Orders No orders of the defined types were placed in this encounter.   Follow-up Plan Follow-up with  [name], FNP as planned Schedule mammogram, pap   I have personally reviewed and noted the following in the patient's chart:   Medical and social history Use of alcohol, tobacco or illicit drugs  Current medications and supplements Functional ability and status Nutritional status Physical activity Advanced directives List of other physicians Hospitalizations, surgeries, and ER visits in previous 12 months Vitals Screenings to include cognitive, depression, and falls Referrals and appointments  In addition, I have reviewed and discussed with [name] certain preventive protocols, quality metrics, and best practice recommendations. A written personalized care plan for preventive services as well as general preventive health recommendations is available and can be mailed to the patient at her request.      Baldomero Lamy, LPN  7/57/9728   I have reviewed and agree with the above AWV documentation.   Evelina Dun, FNP

## 2019-12-19 ENCOUNTER — Encounter: Payer: Self-pay | Admitting: Nurse Practitioner

## 2019-12-19 ENCOUNTER — Ambulatory Visit (INDEPENDENT_AMBULATORY_CARE_PROVIDER_SITE_OTHER): Payer: Medicare HMO | Admitting: Nurse Practitioner

## 2019-12-19 ENCOUNTER — Other Ambulatory Visit: Payer: Self-pay

## 2019-12-19 VITALS — BP 120/65 | HR 77 | Temp 98.3°F | Resp 20 | Ht 63.0 in | Wt 212.0 lb

## 2019-12-19 DIAGNOSIS — I2510 Atherosclerotic heart disease of native coronary artery without angina pectoris: Secondary | ICD-10-CM | POA: Diagnosis not present

## 2019-12-19 DIAGNOSIS — G2581 Restless legs syndrome: Secondary | ICD-10-CM | POA: Diagnosis not present

## 2019-12-19 DIAGNOSIS — F3342 Major depressive disorder, recurrent, in full remission: Secondary | ICD-10-CM | POA: Diagnosis not present

## 2019-12-19 DIAGNOSIS — I6523 Occlusion and stenosis of bilateral carotid arteries: Secondary | ICD-10-CM

## 2019-12-19 DIAGNOSIS — E7800 Pure hypercholesterolemia, unspecified: Secondary | ICD-10-CM | POA: Diagnosis not present

## 2019-12-19 DIAGNOSIS — E1159 Type 2 diabetes mellitus with other circulatory complications: Secondary | ICD-10-CM | POA: Diagnosis not present

## 2019-12-19 DIAGNOSIS — I10 Essential (primary) hypertension: Secondary | ICD-10-CM | POA: Diagnosis not present

## 2019-12-19 DIAGNOSIS — J449 Chronic obstructive pulmonary disease, unspecified: Secondary | ICD-10-CM | POA: Diagnosis not present

## 2019-12-19 DIAGNOSIS — F411 Generalized anxiety disorder: Secondary | ICD-10-CM

## 2019-12-19 DIAGNOSIS — R21 Rash and other nonspecific skin eruption: Secondary | ICD-10-CM

## 2019-12-19 DIAGNOSIS — I2583 Coronary atherosclerosis due to lipid rich plaque: Secondary | ICD-10-CM

## 2019-12-19 DIAGNOSIS — E559 Vitamin D deficiency, unspecified: Secondary | ICD-10-CM

## 2019-12-19 LAB — BAYER DCA HB A1C WAIVED: HB A1C (BAYER DCA - WAIVED): 6.4 % (ref <7.0)

## 2019-12-19 MED ORDER — PRAMIPEXOLE DIHYDROCHLORIDE 0.5 MG PO TABS: 0.5000 mg | ORAL_TABLET | Freq: Two times a day (BID) | ORAL | 1 refills | Status: DC

## 2019-12-19 MED ORDER — ATORVASTATIN CALCIUM 80 MG PO TABS: 80.0000 mg | ORAL_TABLET | Freq: Every day | ORAL | 1 refills | Status: DC

## 2019-12-19 MED ORDER — LISINOPRIL 40 MG PO TABS: 40.0000 mg | ORAL_TABLET | Freq: Every day | ORAL | 1 refills | Status: DC

## 2019-12-19 MED ORDER — METOPROLOL TARTRATE 25 MG PO TABS: 25.0000 mg | ORAL_TABLET | Freq: Two times a day (BID) | ORAL | 1 refills | Status: DC

## 2019-12-19 MED ORDER — ESCITALOPRAM OXALATE 20 MG PO TABS: 20.0000 mg | ORAL_TABLET | Freq: Every day | ORAL | 1 refills | Status: DC

## 2019-12-19 MED ORDER — ALPRAZOLAM 1 MG PO TABS: 1.0000 mg | ORAL_TABLET | Freq: Three times a day (TID) | ORAL | 2 refills | Status: DC | PRN

## 2019-12-19 MED ORDER — FUROSEMIDE 20 MG PO TABS
20.0000 mg | ORAL_TABLET | Freq: Every day | ORAL | 3 refills | Status: DC | PRN
Start: 2019-12-19 — End: 2020-03-26

## 2019-12-19 MED ORDER — METFORMIN HCL 500 MG PO TABS: 500.0000 mg | ORAL_TABLET | Freq: Two times a day (BID) | ORAL | 1 refills | Status: DC

## 2019-12-19 MED ORDER — HYDROXYZINE PAMOATE 25 MG PO CAPS: 25.0000 mg | ORAL_CAPSULE | Freq: Three times a day (TID) | ORAL | 0 refills | Status: DC | PRN

## 2019-12-19 NOTE — Progress Notes (Signed)
Subjective:    Patient ID: [name], female    DOB: [birth year], 64 y.o.   MRN: [id number]   Chief Complaint: medical management of chronic issues    HPI:  1. Essential hypertension No c/o chest pain,  or headache. She does  check her blood pressure at home and it I running around 150/80. At lat appointment we increased her lisinoril to 2 a day, but she say that caused diarrhea. BP Readings from Last 3 Encounters:  05/16/19 (!) 175/86  04/25/19 (!) 155/81  08/12/18 (!) 156/76     2. Coronary artery disease due to lipid rich plaque He had referral in place to see cardiology. According to her chart, several attempts were made to reach her for an appointment. They left mesage but recieved no response from the patient.  3. Bilateral carotid artery stenosis Patient had carotid doppler study done on 08/25/18 which showed le then 50% blockage. He denies any dizziness or near syncopial epiodes.  4. Chronic obstructive pulmonary disease, unspecified COPD type ([place]) Ha constant SOB even when she is itting. He I till moking. ays sh ei8s down to lew than a pack a day.  5. Type 2 diabetes mellitus with other circulatory complication, without long-term current use of insulin ([place]) Patient does not check blood sugar at home.  Lab Results  Component Value Date   HGBA1C 6.8 05/16/2019    6. Pure hypercholesterolemia Says she does not eat a lot, but does not really watch what she eat though. Is not able  To do ay exercise. Lab Results  Component Value Date   CHOL 145 05/16/2019   HDL 35 (L) 05/16/2019   LDLCALC 75 05/16/2019   LDLDIRECT 100.0 05/16/2007   TRIG 212 (H) 05/16/2019   CHOLHDL 4.1 05/16/2019     7. Generalized anxiety disorder Says that her anxiety is bad. Does not know why. She is on xanax 84m TID. Says she doe not always have to take 3 a day, but lately has been. GAD 7 : Generalized Anxiety Score 12/19/2019 09/05/2019 08/07/2019 05/16/2019  Nervous, Anxious, on Edge  _0 Control/stop worrying _1 Worry too much - different things _2 Trouble relaxing 3 0 2 2  Restless 3 0 1 2  Easily annoyed or irritable 3 0 3 1  Afraid - awful might happen 2 0 1 3  Total GAD 7 Score _3 Anxiety Difficulty Very difficult Not difficult at all Very difficult -      8. Recurrent major depressive disorder, in full remission ([place] She is on lexapro and abilify. She says she doe not fell like she is doing well. [facility] 12/19/2019 12/11/2019 08/07/2019  Decreased Interest _4 Down, Depressed, Hopeless _5 PHQ - 2 Score _6 Altered sleeping _7 Tired, decreased energy _8 Change in appetite _9 Feeling bad or failure about yourself  _10 Trouble concentrating _11 Moving slowly or fidgety/restless 3 0 2  Suicidal thoughts 0 1 0  PHQ-9 Score _12 Difficult doing work/chores Very difficult Extremely dIfficult Very difficult  Some recent data might be hidden     9. Restless leg syndrome *she says he still ha but really doe not bother her rmuch. She only sleeps a few hours at night.  10.  Vitamin D deficiency Is on a daily vitamin d supplement  11. Morbid obesity ([place]) Weight I down 8lb form last visit Wt Readings from Last 3 Encounters:  12/19/19 212 lb (96.2 kg)  05/16/19 220 lb (99.8 kg)  04/25/19 213 lb (96.6 kg)   BMI Readings from Last 3 Encounters:  12/19/19 37.55 kg/m  05/16/19 38.97 kg/m  04/25/19 37.73 kg/m        Outpatient Encounter Medications as of 12/19/2019  Medication Sig  . albuterol (PROVENTIL HFA;VENTOLIN HFA) 108 (90 Base) MCG/ACT inhaler Inhale 2 puffs into the lungs every 6 (six) hours as needed for wheezing or shortness of breath.  . ALPRAZolam (XANAX) 1 MG tablet Take 1 tablet (1 mg total) by mouth 3 (three) times daily as needed for anxiety.  . ARIPiprazole (ABILIFY) 2 MG tablet Take 1 tablet (2 mg total) by mouth daily.  [name] aspirin 325 MG tablet Take 325 mg  by mouth daily.    [name] atorvastatin (LIPITOR) 80 MG tablet Take 1 tablet (80 mg total) by mouth daily. (Needs to be seen before next refill)  . blood glucose meter kit and supplies KIT Dispense based on patient and insurance preference. Use up to four times daily as directed. (FOR ICD-9 250.00, 250.01).  . Blood Glucose Monitoring Suppl (BLOOD GLUCOSE MONITOR SYSTEM) w/Device KIT 1 Device by Does not apply route 2 (two) times daily.  . Cholecalciferol (VITAMIN D3) 5000 units TABS Take 1 tablet by mouth daily.  [name] escitalopram (LEXAPRO) 20 MG tablet Take 1 tablet (20 mg total) by mouth daily.  . fish oil-omega-3 fatty acids 1000 MG capsule Take 2 g by mouth 2 (two) times daily.   . fluticasone furoate-vilanterol (BREO ELLIPTA) 100-25 MCG/INH AEPB Inhale 1 puff into the lungs daily.  [name] glucose blood (IGLUCOSE TEST STRIPS) test strip Use to check blood sugars twice daily Dx E11.9  . hydrOXYzine (VISTARIL) 25 MG capsule Take 1 capsule (25 mg total) by mouth 3 (three) times daily as needed. (Patient not taking: Reported on 12/11/2019)  . isosorbide mononitrate (IMDUR) 30 MG 24 hr tablet Take 0.5 tablets (15 mg total) by mouth daily.  . Lancets MISC Use to check blood sugars twice daily ICD 10 E11.9  . lisinopril-hydrochlorothiazide (ZESTORETIC) 20-12.5 MG tablet Take 2 tablets by mouth daily. (Patient taking differently: Take 1 tablet by mouth daily. )  . metFORMIN (GLUCOPHAGE) 500 MG tablet Take 1 tablet (500 mg total) by mouth 2 (two) times daily with a meal.  . metoprolol tartrate (LOPRESSOR) 25 MG tablet Take 1 tablet (25 mg total) by mouth 2 (two) times daily.  . pramipexole (MIRAPEX) 0.5 MG tablet Take 1 tablet (0.5 mg total) by mouth 2 (two) times daily.  . vitamin B-12 (CYANOCOBALAMIN) 1000 MCG tablet Take 1,000 mcg by mouth daily.     Past Surgical History:  Procedure Laterality Date  . AXILLARY LYMPH NODE DISSECTION  07/31/2011   Procedure: AXILLARY LYMPH NODE DISSECTION;  Surgeon: [name], MD;  Location: [facility];  Service: General;  Laterality: Left;  left axillary sentinal node biopsy  . BREAST SURGERY    . CARDIAC CATHETERIZATION  11/02/2006; 06/03/2011  . CESAREAN SECTION  1987  . CORONARY STENT PLACEMENT  11/02/2006  . ELBOW SURGERY     right  . MASTECTOMY PARTIAL / LUMPECTOMY  06/30/2011   left    Family History  Problem Relation Age of Onset  . Heart disease Mother   . Heart disease Father   .  Cancer Maternal Aunt        pt unaware of what kind  . Cancer Maternal Grandmother        ovarian  . Heart attack Sister   . Thyroid disease Daughter   . Anxiety disorder Daughter   . Depression Daughter   . Bipolar disorder Sister     New complaints: None  t oday  Social history: *lives by herself with her dog  Controlled substance contract: 05/17/19    Review of Systems  Constitutional: Negative for diaphoresis.  Eyes: Negative for pain.  Respiratory: Negative for shortness of breath.   Cardiovascular: Negative for chest pain, palpitations and leg swelling.  Gastrointestinal: Negative for abdominal pain.  Endocrine: Negative for polydipsia.  Skin: Negative for rash.  Neurological: Negative for dizziness, weakness and headaches.  Hematological: Does not bruise/bleed easily.  All other systems reviewed and are negative.      Objective:   Physical Exam Vitals and nursing note reviewed.  Constitutional:      General: She is not in acute distress.    Appearance: Normal appearance. She is well-developed.  HENT:     Head: Normocephalic.     Nose: Nose normal.  Eyes:     Pupils: Pupils are equal, round, and reactive to light.  Neck:     Vascular: No carotid bruit or JVD.  Cardiovascular:     Rate and Rhythm: Normal rate and regular rhythm.     Heart sounds: Normal heart sounds.  Pulmonary:     Effort: Pulmonary effort is normal. No respiratory distress.     Breath sounds: Normal breath sounds. No wheezing or rales.  Chest:      Chest wall: No tenderness.  Abdominal:     General: Bowel sounds are normal. There is no distension or abdominal bruit.     Palpations: Abdomen is soft. There is no hepatomegaly, splenomegaly, mass or pulsatile mass.     Tenderness: There is no abdominal tenderness.  Musculoskeletal:        General: Normal range of motion.     Cervical back: Normal range of motion and neck supple.  Lymphadenopathy:     Cervical: No cervical adenopathy.  Skin:    General: Skin is warm and dry.     Comments: Lipoma on forehead about the ize of a quater  Neurological:     Mental Status: She is alert and oriented to person, place, and time.     Deep Tendon Reflexes: Reflexes are normal and symmetric.  Psychiatric:        Behavior: Behavior normal.        Thought Content: Thought content normal.        Judgment: Judgment normal.       Blood pressure 120/65, pulse 77, temperature 98.3 F (36.8 C), temperature source Temporal, resp. rate 20, height _0  (1.6 m), weight 212 lb (96.2 kg), last menstrual period 10/17/2005, SpO2 90 %.  HGBA1c 6.4%    Assessment & Plan:   [name] comes in today with chief complaint of Medical Management of Chronic Issues   Diagnosis and orders addressed:  1. Essential hypertension Keep diary of blood pressure We put her back on lisinoril 60m daily and added;aix back We stopped lisinopril Hctz - CBC with Differential/Platelet - CMP14+EGFR - lisinopril (ZESTRIL) 40 MG tablet; Take 1 tablet (40 mg total) by mouth daily.  Dispense: 90 tablet; Refill: 1  2. Coronary artery disease due to lipid rich plaque Patient will call cardiology  office and make appointment - metoprolol tartrate (LOPRESSOR) 25 MG tablet; Take 1 tablet (25 mg total) by mouth 2 (two) times daily.  Dispense: 180 tablet; Refill: 1  3. Bilateral carotid artery stenosis Will continue to keep a achek on thi.  4. Chronic obstructive pulmonary disease, unspecified COPD type  ([place]) Continue current inhaler Continue to cut back on moking  5. Type 2 diabetes mellitus with other circulatory complication, without long-term current use of insulin ([facility]) continue low carb diet - Bayer DCA Hb A1c Waived - Microalbumin / creatinine urine ratio - metFORMIN (GLUCOPHAGE) 500 MG tablet; Take 1 tablet (500 mg total) by mouth 2 (two) times daily with a meal.  Dispense: 180 tablet; Refill: 1  6. Pure hypercholesterolemia Low fat diet - Lipid panel - atorvastatin (LIPITOR) 80 MG tablet; Take 1 tablet (80 mg total) by mouth daily. (Needs to be seen before next refill)  Dispense: 90 tablet; Refill: 1  7. Generalized anxiety disorder stress management - ALPRAZolam (XANAX) 1 MG tablet; Take 1 tablet (1 mg total) by mouth 3 (three) times daily as needed for anxiety.  Dispense: 90 tablet; Refill: 2  8. Recurrent major depressive disorder, in full remission ([place]) Wean off lexapro - escitalopram (LEXAPRO) 20 MG tablet; Take 1 tablet (20 mg total) by mouth daily.  Dispense: 90 tablet; Refill: 1 - Ambulatory referral to Psychiatry  9. Restless leg syndrome Continue mirapex Keep leg warm at night - pramipexole (MIRAPEX) 0.5 MG tablet; Take 1 tablet (0.5 mg total) by mouth 2 (two) times daily.  Dispense: 180 tablet; Refill: 1  10. Vitamin D deficiency continue daily vitamin d supplement  11. Morbid obesity ([place]) Discussed diet and exercise for person with BMI >25 Will recheck weight in 3-6 months   12. Rash and nonspecific skin eruption - hydrOXYzine (VISTARIL) 25 MG capsule; Take 1 capsule (25 mg total) by mouth 3 (three) times daily as needed.  Dispense: 30 capsule; Refill: 0   Labs pending Health Maintenance reviewed Diet and exercise encouraged  Follow up plan: 3 month or prn   [name], FNP

## 2019-12-19 NOTE — Patient Instructions (Signed)

## 2019-12-20 LAB — CBC WITH DIFFERENTIAL/PLATELET
Basophils Absolute: 0.1 10*3/uL (ref 0.0–0.2)
Basos: 1 %
EOS (ABSOLUTE): 0.3 10*3/uL (ref 0.0–0.4)
Eos: 3 %
Hematocrit: 49.4 % — ABNORMAL HIGH (ref 34.0–46.6)
Hemoglobin: 16.7 g/dL — ABNORMAL HIGH (ref 11.1–15.9)
Immature Grans (Abs): 0 10*3/uL (ref 0.0–0.1)
Immature Granulocytes: 0 %
Lymphocytes Absolute: 1.7 10*3/uL (ref 0.7–3.1)
Lymphs: 15 %
MCH: 31 pg (ref 26.6–33.0)
MCHC: 33.8 g/dL (ref 31.5–35.7)
MCV: 92 fL (ref 79–97)
Monocytes Absolute: 0.6 10*3/uL (ref 0.1–0.9)
Monocytes: 5 %
Neutrophils Absolute: 9 10*3/uL — ABNORMAL HIGH (ref 1.4–7.0)
Neutrophils: 76 %
Platelets: 211 10*3/uL (ref 150–450)
RBC: 5.39 x10E6/uL — ABNORMAL HIGH (ref 3.77–5.28)
RDW: 14.4 % (ref 11.7–15.4)
WBC: 11.8 10*3/uL — ABNORMAL HIGH (ref 3.4–10.8)

## 2019-12-20 LAB — LIPID PANEL
Chol/HDL Ratio: 4.2 ratio (ref 0.0–4.4)
Cholesterol, Total: 135 mg/dL (ref 100–199)
HDL: 32 mg/dL — ABNORMAL LOW (ref >39)
LDL Chol Calc (NIH): 73 mg/dL (ref 0–99)
Triglycerides: 174 mg/dL — ABNORMAL HIGH (ref 0–149)
VLDL Cholesterol Cal: 30 mg/dL (ref 5–40)

## 2019-12-20 LAB — CMP14+EGFR
ALT: 19 IU/L (ref 0–32)
AST: 15 IU/L (ref 0–40)
Albumin/Globulin Ratio: 1.9 (ref 1.2–2.2)
Albumin: 4.1 g/dL (ref 3.8–4.8)
Alkaline Phosphatase: 89 IU/L (ref 48–121)
BUN/Creatinine Ratio: 13 (ref 12–28)
BUN: 12 mg/dL (ref 8–27)
Bilirubin Total: 0.5 mg/dL (ref 0.0–1.2)
CO2: 20 mmol/L (ref 20–29)
Calcium: 9.8 mg/dL (ref 8.7–10.3)
Chloride: 101 mmol/L (ref 96–106)
Creatinine, Ser: 0.92 mg/dL (ref 0.57–1.00)
GFR calc Af Amer: 77 mL/min/{1.73_m2} (ref >59)
GFR calc non Af Amer: 66 mL/min/{1.73_m2} (ref >59)
Globulin, Total: 2.2 g/dL (ref 1.5–4.5)
Glucose: 168 mg/dL — ABNORMAL HIGH (ref 65–99)
Potassium: 4.2 mmol/L (ref 3.5–5.2)
Sodium: 141 mmol/L (ref 134–144)
Total Protein: 6.3 g/dL (ref 6.0–8.5)

## 2020-01-04 ENCOUNTER — Telehealth: Payer: Self-pay | Admitting: Nurse Practitioner

## 2020-01-04 ENCOUNTER — Telehealth: Payer: Self-pay

## 2020-01-04 NOTE — Chronic Care Management (AMB) (Signed)
  Care Management   Note  01/04/2020 Name: [name] MRN: [id number] DOB: [birth year]  [name] is a 64 y.o. year old female who is a primary care patient of [name], [place] and is actively engaged with the care management team. I reached out to [name] by phone today to assist with re-scheduling a follow up visit with the Licensed Clinical Education officer, museum.  Follow up plan: Telephone appointment with care management team member scheduled for: 02/08/2020  [facility]  [place], [place] [address] Direct Dial: [email or web address] Website: [email or web address]

## 2020-02-08 ENCOUNTER — Ambulatory Visit: Payer: Medicare HMO | Admitting: Licensed Clinical Social Worker

## 2020-02-08 DIAGNOSIS — F411 Generalized anxiety disorder: Secondary | ICD-10-CM

## 2020-02-08 DIAGNOSIS — E7800 Pure hypercholesterolemia, unspecified: Secondary | ICD-10-CM

## 2020-02-08 DIAGNOSIS — J441 Chronic obstructive pulmonary disease with (acute) exacerbation: Secondary | ICD-10-CM

## 2020-02-08 DIAGNOSIS — I10 Essential (primary) hypertension: Secondary | ICD-10-CM

## 2020-02-08 DIAGNOSIS — I2583 Coronary atherosclerosis due to lipid rich plaque: Secondary | ICD-10-CM

## 2020-02-08 NOTE — Patient Instructions (Addendum)
Licensed Clinical Social Worker Visit Information  Materials Provided: No  02/08/2020  Name: [name]            MRN: [id number]       DOB: Jun 11, 1956  [name] is a 64 y.o. year old female who is a primary care patient of [name], [place]. The CCM team was consulted for assistance with [name] .   Review of patient status, including review of consultants reports, other relevant assessments, and collaboration with appropriate care team members and the patient's provider was performed as part of comprehensive patient evaluation and provision of chronic care management services.    SDOH (Social Determinants of Health) assessments performed: No;risk for tobacco use; risk of depression; risk for food insecurity  [name] called phone number of client today several times but [name] was not able to speak via phone with client today. Voice message was not set up on client's phone and thus [name] was not able to leave client phone message. [name] will call client again in next 4 weeks to try to talk further with client about social work needs of client  Follow Up Plan: [name] to call client in next 4 weeks to assess the psychosocial needs of client at that time  [name] was not able to speak via phone today with client; thus the patient was not able to verbalize understanding of instructions provided today and was not able to accept or decline a print copy of patient instruction materials.   [name].Gesenia Bantz MSW, [name] Licensed Clinical Social Worker [facility] [phone]

## 2020-02-08 NOTE — Chronic Care Management (AMB) (Signed)
Chronic Care Management    Clinical Social Work Follow Up Note  02/08/2020 Name: [name] MRN: [id number] DOB: [birth year]  [name] is a 64 y.o. year old female who is a primary care patient of [name], [place]. The CCM team was consulted for assistance with [name] .   Review of patient status, including review of consultants reports, other relevant assessments, and collaboration with appropriate care team members and the patient's provider was performed as part of comprehensive patient evaluation and provision of chronic care management services.    SDOH (Social Determinants of Health) assessments performed: No;risk for tobacco use; risk of depression; risk for food insecurity    Office Visit from 12/19/2019 in [facility]  PHQ-9 Total Score 24       GAD 7 : Generalized Anxiety Score 12/19/2019 09/05/2019 08/07/2019 05/16/2019  Nervous, Anxious, on Edge _0 Control/stop worrying _1 Worry too much - different things _2 Trouble relaxing 3 0 2 2  Restless 3 0 1 2  Easily annoyed or irritable 3 0 3 1  Afraid - awful might happen 2 0 1 3  Total GAD 7 Score _3 Anxiety Difficulty Very difficult Not difficult at all Very difficult -    Outpatient Encounter Medications as of 02/08/2020  Medication Sig  . albuterol (PROVENTIL HFA;VENTOLIN HFA) 108 (90 Base) MCG/ACT inhaler Inhale 2 puffs into the lungs every 6 (six) hours as needed for wheezing or shortness of breath.  . ALPRAZolam (XANAX) 1 MG tablet Take 1 tablet (1 mg total) by mouth 3 (three) times daily as needed for anxiety.  [name] aspirin 325 MG tablet Take 325 mg by mouth daily.    [name] atorvastatin (LIPITOR) 80 MG tablet Take 1 tablet (80 mg total) by mouth daily. (Needs to be seen before next refill)  . blood glucose meter kit and supplies KIT Dispense based on patient and insurance preference. Use up to four times daily as directed. (FOR ICD-9 250.00,  250.01).  . Blood Glucose Monitoring Suppl (BLOOD GLUCOSE MONITOR SYSTEM) w/Device KIT 1 Device by Does not apply route 2 (two) times daily.  . Cholecalciferol (VITAMIN D3) 5000 units TABS Take 1 tablet by mouth daily.  [name] escitalopram (LEXAPRO) 20 MG tablet Take 1 tablet (20 mg total) by mouth daily.  . fish oil-omega-3 fatty acids 1000 MG capsule Take 2 g by mouth 2 (two) times daily.   . fluticasone furoate-vilanterol (BREO ELLIPTA) 100-25 MCG/INH AEPB Inhale 1 puff into the lungs daily.  . furosemide (LASIX) 20 MG tablet Take 1 tablet (20 mg total) by mouth daily as needed.  [name] glucose blood (IGLUCOSE TEST STRIPS) test strip Use to check blood sugars twice daily Dx E11.9  . hydrOXYzine (VISTARIL) 25 MG capsule Take 1 capsule (25 mg total) by mouth 3 (three) times daily as needed.  . isosorbide mononitrate (IMDUR) 30 MG 24 hr tablet Take 0.5 tablets (15 mg total) by mouth daily.  . Lancets MISC Use to check blood sugars twice daily ICD 10 E11.9  . lisinopril (ZESTRIL) 40 MG tablet Take 1 tablet (40 mg total) by mouth daily.  . metFORMIN (GLUCOPHAGE) 500 MG tablet Take 1 tablet (500 mg total) by mouth 2 (two) times daily with a meal.  . metoprolol tartrate (LOPRESSOR) 25 MG tablet Take 1 tablet (25 mg total) by mouth 2 (two) times daily.  . pramipexole (MIRAPEX) 0.5 MG  tablet Take 1 tablet (0.5 mg total) by mouth 2 (two) times daily.  . vitamin B-12 (CYANOCOBALAMIN) 1000 MCG tablet Take 1,000 mcg by mouth daily.   No facility-administered encounter medications on file as of 02/08/2020.    [name] called phone number of client today several times but [name] was not able to speak via phone with client today. Voice message was not set up on client's phone and thus [name] was not able to leave client phone message. [name] will call client again in next 4 weeks to try to talk further with client about social work needs of client  Follow Up Plan: [name] to call client in next 4 weeks to assess the psychosocial  needs of client at that time  [name].Jep Dyas MSW, [name] Licensed Clinical Social Worker [facility] Care Management [phone]

## 2020-03-08 ENCOUNTER — Other Ambulatory Visit: Payer: Self-pay | Admitting: Nurse Practitioner

## 2020-03-08 DIAGNOSIS — R21 Rash and other nonspecific skin eruption: Secondary | ICD-10-CM

## 2020-03-13 ENCOUNTER — Ambulatory Visit: Payer: Medicare HMO | Admitting: Licensed Clinical Social Worker

## 2020-03-13 DIAGNOSIS — F411 Generalized anxiety disorder: Secondary | ICD-10-CM

## 2020-03-13 DIAGNOSIS — I2583 Coronary atherosclerosis due to lipid rich plaque: Secondary | ICD-10-CM

## 2020-03-13 DIAGNOSIS — J449 Chronic obstructive pulmonary disease, unspecified: Secondary | ICD-10-CM

## 2020-03-13 DIAGNOSIS — I2510 Atherosclerotic heart disease of native coronary artery without angina pectoris: Secondary | ICD-10-CM

## 2020-03-13 DIAGNOSIS — E785 Hyperlipidemia, unspecified: Secondary | ICD-10-CM

## 2020-03-13 DIAGNOSIS — I10 Essential (primary) hypertension: Secondary | ICD-10-CM

## 2020-03-13 NOTE — Patient Instructions (Addendum)
Licensed Clinical Social Worker Visit Information  Materials Provided: No  03/13/2020  Name: [name]            MRN: [id number]       DOB: [birth year]  [name] is a 64 y.o. year old female who is a primary care patient of [name], [place]. The CCM team was consulted for assistance with [name] .   Review of patient status, including review of consultants reports, other relevant assessments, and collaboration with appropriate care team members and the patient's provider was performed as part of comprehensive patient evaluation and provision of chronic care management services.    SDOH (Social Determinants of Health) assessments performed: No; risk for tobacco use; risk for depression; risk for stress; risk for physical inactivity  [name] called client home phone number several times today but [name] was not able to speak via phone with client. Also, phone message services was not set up on phone of client and [name] was not able to leave phone message today for client  Follow Up Plan:  [name] to call client in next 4 weeks to assess the psychosocial needs of client at that time  [name] was not able to speak via phone with client today; thus, the patient was not able to verbalize understanding of instructions provided today and was not able to accept or decline a print copy of patient instruction materials.   [name].Gabreille Dardis MSW, [name] Licensed Clinical Social Worker [facility] Care Management [phone]

## 2020-03-13 NOTE — Chronic Care Management (AMB) (Signed)
Chronic Care Management    Clinical Social Work Follow Up Note  03/13/2020 Name: [name] MRN: [id number] DOB: Dec 10, 1955  [name] is a 64 y.o. year old female who is a primary care patient of [name], [place]. The CCM team was consulted for assistance with [name] .   Review of patient status, including review of consultants reports, other relevant assessments, and collaboration with appropriate care team members and the patient's provider was performed as part of comprehensive patient evaluation and provision of chronic care management services.    SDOH (Social Determinants of Health) assessments performed: No; risk for tobacco use; risk for depression; risk for stress; risk for physical inactivity    Office Visit from 12/19/2019 in [facility]  PHQ-9 Total Score 24     GAD 7 : Generalized Anxiety Score 12/19/2019 09/05/2019 08/07/2019 05/16/2019  Nervous, Anxious, on Edge 3 1 3 2   Control/stop worrying 3 1 3 3   Worry too much - different things 3 1 3 3   Trouble relaxing 3 0 2 2  Restless 3 0 1 2  Easily annoyed or irritable 3 0 3 1  Afraid - awful might happen 2 0 1 3  Total GAD 7 Score 20 3 16 16   Anxiety Difficulty Very difficult Not difficult at all Very difficult -    Outpatient Encounter Medications as of 03/13/2020  Medication Sig  . albuterol (PROVENTIL HFA;VENTOLIN HFA) 108 (90 Base) MCG/ACT inhaler Inhale 2 puffs into the lungs every 6 (six) hours as needed for wheezing or shortness of breath.  . ALPRAZolam (XANAX) 1 MG tablet Take 1 tablet (1 mg total) by mouth 3 (three) times daily as needed for anxiety.  [name] aspirin 325 MG tablet Take 325 mg by mouth daily.    [name] atorvastatin (LIPITOR) 80 MG tablet Take 1 tablet (80 mg total) by mouth daily. (Needs to be seen before next refill)  . blood glucose meter kit and supplies KIT Dispense based on patient and insurance preference. Use up to four times daily as directed. (FOR  ICD-9 250.00, 250.01).  . Blood Glucose Monitoring Suppl (BLOOD GLUCOSE MONITOR SYSTEM) w/Device KIT 1 Device by Does not apply route 2 (two) times daily.  . Cholecalciferol (VITAMIN D3) 5000 units TABS Take 1 tablet by mouth daily.  [name] escitalopram (LEXAPRO) 20 MG tablet Take 1 tablet (20 mg total) by mouth daily.  . fish oil-omega-3 fatty acids 1000 MG capsule Take 2 g by mouth 2 (two) times daily.   . fluticasone furoate-vilanterol (BREO ELLIPTA) 100-25 MCG/INH AEPB Inhale 1 puff into the lungs daily.  . furosemide (LASIX) 20 MG tablet Take 1 tablet (20 mg total) by mouth daily as needed.  [name] glucose blood (IGLUCOSE TEST STRIPS) test strip Use to check blood sugars twice daily Dx E11.9  . hydrOXYzine (VISTARIL) 25 MG capsule TAKE 1 CAPSULE THREE TIMES DAILY AS NEEDED  . isosorbide mononitrate (IMDUR) 30 MG 24 hr tablet Take 0.5 tablets (15 mg total) by mouth daily.  . Lancets MISC Use to check blood sugars twice daily ICD 10 E11.9  . lisinopril (ZESTRIL) 40 MG tablet Take 1 tablet (40 mg total) by mouth daily.  . metFORMIN (GLUCOPHAGE) 500 MG tablet Take 1 tablet (500 mg total) by mouth 2 (two) times daily with a meal.  . metoprolol tartrate (LOPRESSOR) 25 MG tablet Take 1 tablet (25 mg total) by mouth 2 (two) times daily.  . pramipexole (MIRAPEX) 0.5 MG tablet Take 1 tablet (  0.5 mg total) by mouth 2 (two) times daily.  . vitamin B-12 (CYANOCOBALAMIN) 1000 MCG tablet Take 1,000 mcg by mouth daily.   No facility-administered encounter medications on file as of 03/13/2020.    [name] called client home phone number several times today but [name] was not able to speak via phone with client. Also, phone message services was not set up on phone of client and [name] was not able to leave phone message today for client  Follow Up Plan:  [name] to call client in next 4 weeks to assess the psychosocial needs of client at that time  [name].Teion Ballin MSW, [name] Licensed Clinical Social Worker [facility] [phone]

## 2020-03-22 ENCOUNTER — Other Ambulatory Visit: Payer: Self-pay

## 2020-03-22 MED ORDER — LANCETS MISC
9 refills | Status: DC
Start: 2020-03-22 — End: 2020-04-02

## 2020-03-22 MED ORDER — IGLUCOSE TEST STRIPS VI STRP: ORAL_STRIP | 9 refills | Status: DC

## 2020-03-25 ENCOUNTER — Other Ambulatory Visit: Payer: Self-pay

## 2020-03-25 NOTE — Telephone Encounter (Signed)
Erroneous encounter

## 2020-03-26 ENCOUNTER — Ambulatory Visit (INDEPENDENT_AMBULATORY_CARE_PROVIDER_SITE_OTHER): Payer: Medicare HMO | Admitting: Nurse Practitioner

## 2020-03-26 ENCOUNTER — Encounter: Payer: Self-pay | Admitting: Nurse Practitioner

## 2020-03-26 ENCOUNTER — Other Ambulatory Visit: Payer: Self-pay | Admitting: *Deleted

## 2020-03-26 ENCOUNTER — Other Ambulatory Visit: Payer: Self-pay

## 2020-03-26 VITALS — BP 189/83 | HR 69 | Temp 98.2°F | Resp 20 | Ht 63.0 in | Wt 207.0 lb

## 2020-03-26 DIAGNOSIS — L2084 Intrinsic (allergic) eczema: Secondary | ICD-10-CM

## 2020-03-26 DIAGNOSIS — E7800 Pure hypercholesterolemia, unspecified: Secondary | ICD-10-CM | POA: Diagnosis not present

## 2020-03-26 DIAGNOSIS — F411 Generalized anxiety disorder: Secondary | ICD-10-CM

## 2020-03-26 DIAGNOSIS — J449 Chronic obstructive pulmonary disease, unspecified: Secondary | ICD-10-CM | POA: Diagnosis not present

## 2020-03-26 DIAGNOSIS — G2581 Restless legs syndrome: Secondary | ICD-10-CM | POA: Diagnosis not present

## 2020-03-26 DIAGNOSIS — I2510 Atherosclerotic heart disease of native coronary artery without angina pectoris: Secondary | ICD-10-CM | POA: Diagnosis not present

## 2020-03-26 DIAGNOSIS — I10 Essential (primary) hypertension: Secondary | ICD-10-CM | POA: Diagnosis not present

## 2020-03-26 DIAGNOSIS — E1159 Type 2 diabetes mellitus with other circulatory complications: Secondary | ICD-10-CM | POA: Diagnosis not present

## 2020-03-26 LAB — BAYER DCA HB A1C WAIVED: HB A1C (BAYER DCA - WAIVED): 6.2 % (ref <7.0)

## 2020-03-26 MED ORDER — BD SWAB SINGLE USE REGULAR PADS: MEDICATED_PAD | 3 refills | Status: DC

## 2020-03-26 MED ORDER — TRUE METRIX AIR GLUCOSE METER W/DEVICE KIT: PACK | 0 refills | Status: DC

## 2020-03-26 MED ORDER — ISOSORBIDE MONONITRATE ER 30 MG PO TB24: 15.0000 mg | ORAL_TABLET | Freq: Every day | ORAL | 1 refills | Status: DC

## 2020-03-26 MED ORDER — ALPRAZOLAM 1 MG PO TABS: 1.0000 mg | ORAL_TABLET | Freq: Three times a day (TID) | ORAL | 2 refills | Status: DC | PRN

## 2020-03-26 MED ORDER — TRIAMCINOLONE ACETONIDE 0.1 % EX CREA: 1.0000 "application " | TOPICAL_CREAM | Freq: Two times a day (BID) | CUTANEOUS | 0 refills | Status: DC

## 2020-03-26 MED ORDER — FUROSEMIDE 20 MG PO TABS: 20.0000 mg | ORAL_TABLET | Freq: Every day | ORAL | 3 refills | Status: DC | PRN

## 2020-03-26 MED ORDER — TRUE METRIX LEVEL 1 LOW VI SOLN: 0 refills | Status: DC

## 2020-03-26 MED ORDER — PRAMIPEXOLE DIHYDROCHLORIDE 1 MG PO TABS: 1.0000 mg | ORAL_TABLET | Freq: Three times a day (TID) | ORAL | 3 refills | Status: DC

## 2020-03-26 MED ORDER — TRELEGY ELLIPTA 100-62.5-25 MCG/INH IN AEPB: 1.0000 | INHALATION_SPRAY | Freq: Every day | RESPIRATORY_TRACT | 3 refills | Status: DC

## 2020-03-26 MED ORDER — METHYLPREDNISOLONE ACETATE 80 MG/ML IJ SUSP
80.0000 mg | Freq: Once | INTRAMUSCULAR | Status: AC
  Administered 2020-03-26: 80 mg via INTRAMUSCULAR

## 2020-03-26 NOTE — Progress Notes (Addendum)
Subjective:    Patient ID: [name], female    DOB: [birth year], 64 y.o.   MRN: [id number]   Chief Complaint: No chief complaint on file.    HPI:  1. Essential hypertension BP Readings from Last 3 Encounters:  03/26/20 (!) 189/83  12/19/19 120/65  05/16/19 (!) 175/86  Does not check blood pressure at home but takes medication as prescribed. Denies chest pain. [facility].    2. Chronic obstructive pulmonary disease, unspecified COPD type ([place]) Uses inhaler daily, uses albuterol inhaler more frequency. Smokes <1pack per day.   3. Type 2 diabetes mellitus with other circulatory complication, without long-term current use of insulin (HCC) Lab Results  Component Value Date   HGBA1C 6.4 12/19/2019  Checks CBG occasionally at home. Is waiting on a new monitor from insurance. States decreased appetite, denies watching carb and sugar intake. Takes medication as prescribed. Today's HA1C is 6.2%   4. Pure hypercholesterolemia Lab Results  Component Value Date   CHOL 135 12/19/2019   HDL 32 (L) 12/19/2019   LDLCALC 73 12/19/2019   LDLDIRECT 100.0 05/16/2007   TRIG 174 (H) 12/19/2019   CHOLHDL 4.2 12/19/2019  Takes medication as prescribed. Denies eating fried food, does eat chinese and fast food occasionally.   5. Generalized anxiety disorder GAD 7 : Generalized Anxiety Score 12/19/2019 09/05/2019 08/07/2019 05/16/2019  Nervous, Anxious, on Edge 3 1 3 2   Control/stop worrying 3 1 3 3   Worry too much - different things 3 1 3 3   Trouble relaxing 3 0 2 2  Restless 3 0 1 2  Easily annoyed or irritable 3 0 3 1  Afraid - awful might happen 2 0 1 3  Total GAD 7 Score 20 3 16 16   Anxiety Difficulty Very difficult Not difficult at all Very difficult -   Takes Xanax daily, no complaints on effectiveness.   6. Morbid obesity ([place]) Wt Readings from Last 3 Encounters:  03/26/20 207 lb (93.9 kg)  12/19/19 212 lb (96.2 kg)  05/16/19 220 lb (99.8 kg)   BMI Readings from  Last 3 Encounters:  03/26/20 36.67 kg/m  12/19/19 37.55 kg/m  05/16/19 38.97 kg/m   Denies any formal exercise.     Outpatient Encounter Medications as of 03/26/2020  Medication Sig  . albuterol (PROVENTIL HFA;VENTOLIN HFA) 108 (90 Base) MCG/ACT inhaler Inhale 2 puffs into the lungs every 6 (six) hours as needed for wheezing or shortness of breath.  . Alcohol Swabs (B-D SINGLE USE SWABS REGULAR) PADS check blood sugars twice daily Dx E11.9  . ALPRAZolam (XANAX) 1 MG tablet Take 1 tablet (1 mg total) by mouth 3 (three) times daily as needed for anxiety.  [name] aspirin 325 MG tablet Take 325 mg by mouth daily.    [name] atorvastatin (LIPITOR) 80 MG tablet Take 1 tablet (80 mg total) by mouth daily. (Needs to be seen before next refill)  . Blood Glucose Calibration (TRUE METRIX LEVEL 1) Low SOLN Use with glucose monitor Dx E11.9  . blood glucose meter kit and supplies KIT Dispense based on patient and insurance preference. Use up to four times daily as directed. (FOR ICD-9 250.00, 250.01).  . Blood Glucose Monitoring Suppl (TRUE METRIX AIR GLUCOSE METER) w/Device KIT check blood sugars twice daily Dx E11.9  . Cholecalciferol (VITAMIN D3) 5000 units TABS Take 1 tablet by mouth daily.  [name] escitalopram (LEXAPRO) 20 MG tablet Take 1 tablet (20 mg total) by mouth daily.  . fish oil-omega-3 fatty acids 1000  MG capsule Take 2 g by mouth 2 (two) times daily.   . fluticasone furoate-vilanterol (BREO ELLIPTA) 100-25 MCG/INH AEPB Inhale 1 puff into the lungs daily.  . furosemide (LASIX) 20 MG tablet Take 1 tablet (20 mg total) by mouth daily as needed.  [name] glucose blood (IGLUCOSE TEST STRIPS) test strip Use to check blood sugars twice daily Dx E11.9  . hydrOXYzine (VISTARIL) 25 MG capsule TAKE 1 CAPSULE THREE TIMES DAILY AS NEEDED  . isosorbide mononitrate (IMDUR) 30 MG 24 hr tablet Take 0.5 tablets (15 mg total) by mouth daily.  . Lancets MISC Use to check blood sugars twice daily ICD 10 E11.9  . lisinopril  (ZESTRIL) 40 MG tablet Take 1 tablet (40 mg total) by mouth daily.  . metFORMIN (GLUCOPHAGE) 500 MG tablet Take 1 tablet (500 mg total) by mouth 2 (two) times daily with a meal.  . metoprolol tartrate (LOPRESSOR) 25 MG tablet Take 1 tablet (25 mg total) by mouth 2 (two) times daily.  . pramipexole (MIRAPEX) 0.5 MG tablet Take 1 tablet (0.5 mg total) by mouth 2 (two) times daily.  . vitamin B-12 (CYANOCOBALAMIN) 1000 MCG tablet Take 1,000 mcg by mouth daily.   No facility-administered encounter medications on file as of 03/26/2020.    Past Surgical History:  Procedure Laterality Date  . AXILLARY LYMPH NODE DISSECTION  07/31/2011   Procedure: AXILLARY LYMPH NODE DISSECTION;  Surgeon: [name], MD;  Location: [facility];  Service: General;  Laterality: Left;  left axillary sentinal node biopsy  . BREAST SURGERY    . CARDIAC CATHETERIZATION  11/02/2006; 06/03/2011  . CESAREAN SECTION  1987  . CORONARY STENT PLACEMENT  11/02/2006  . ELBOW SURGERY     right  . MASTECTOMY PARTIAL / LUMPECTOMY  06/30/2011   left    Family History  Problem Relation Age of Onset  . Heart disease Mother   . Heart disease Father   . Cancer Maternal Aunt        pt unaware of what kind  . Cancer Maternal Grandmother        ovarian  . Heart attack Sister   . Thyroid disease Daughter   . Anxiety disorder Daughter   . Depression Daughter   . Bipolar disorder Sister     New complaints: C/o cough & increased fatigue x 2 weeks. Body aches x 2 weeks. Takes Advil with little relief.   C/o RLS is not under control, takes 1 tab of Mirapex nightly.  Social history: Lives at home alone, daughter and friends check in on pt.   Controlled substance contract: signed 05/17/2019   Review of Systems  Constitutional: Negative.   HENT: Negative.   Eyes: Negative.   Respiratory: Positive for cough and shortness of breath.   Cardiovascular: Negative.   Gastrointestinal: Negative.   Endocrine:  Negative.   Genitourinary: Negative.   Musculoskeletal: Negative.   Skin: Positive for rash.  Allergic/Immunologic: Negative.   Neurological: Negative.   Hematological: Negative.   Psychiatric/Behavioral: Negative.   All other systems reviewed and are negative.      Objective:   Physical Exam Vitals and nursing note reviewed.  Constitutional:      Appearance: Normal appearance.  HENT:     Head: Normocephalic and atraumatic.     Right Ear: Tympanic membrane, ear canal and external ear normal.     Left Ear: Tympanic membrane, ear canal and external ear normal.     Nose: Nose normal.  Mouth/Throat:     Mouth: Mucous membranes are moist.     Pharynx: Oropharynx is clear.  Eyes:     Extraocular Movements: Extraocular movements intact.     Conjunctiva/sclera: Conjunctivae normal.     Pupils: Pupils are equal, round, and reactive to light.  Cardiovascular:     Rate and Rhythm: Normal rate and regular rhythm.     Pulses: Normal pulses.     Heart sounds: Normal heart sounds.  Pulmonary:     Effort: Pulmonary effort is normal. Prolonged expiration present.     Breath sounds: Normal air entry. Wheezing (exp wheezes throughout) present.  Abdominal:     General: Abdomen is flat. Bowel sounds are normal.     Palpations: Abdomen is soft.  Musculoskeletal:        General: Normal range of motion.     Cervical back: Normal range of motion.  Skin:    General: Skin is warm and dry.     Capillary Refill: Capillary refill takes less than 2 seconds.     Findings: Rash (erythematous scaely dry rash acroos lower back and left thigh and lower abdominal wall.) present. Rash is crusting, scaling and urticarial.          Comments: Dry,  flaky rash   Neurological:     General: No focal deficit present.     Mental Status: She is alert and oriented to person, place, and time. Mental status is at baseline.  Psychiatric:        Mood and Affect: Mood normal.        Behavior: Behavior normal.         Thought Content: Thought content normal.        Judgment: Judgment normal.    BP (!) 189/83   Pulse 69   Temp 98.2 F (36.8 C) (Temporal)   Resp 20   Ht 5' 3"  (1.6 m)   Wt 207 lb (93.9 kg)   LMP 10/17/2005   SpO2 94%   BMI 36.67 kg/m        Assessment & Plan:  [name] comes in today with chief complaint of No chief complaint on file.   Diagnosis and orders addressed:  1. Essential hypertension Check blood pressure regularly and take medication as prescribed. Eat a heart healthy diet and avoid foods that are high in salt.   - CBC with Differential/Platelet - CMP14+EGFR  2. Chronic obstructive pulmonary disease, unspecified COPD type ([place]) Stop BREO Added trelegy 1 puff daily Use inhaler as prescribed. Report any new or worsening symptoms.  3. Type 2 diabetes mellitus with other circulatory complication, without long-term current use of insulin (HCC) Check blood glucose levels regularly and take medication as prescribed. Avoid foods with a high carb and sugar content.   - Bayer DCA Hb A1c Waived - Microalbumin / creatinine urine ratio  4. Pure hypercholesterolemia Take medication as prescribed. Avoid fast food, fried food, and high fat foods.   - Lipid panel  5. Generalized anxiety disorder Take medication as prescribed. Report any new or worsening symptoms.   6. Morbid obesity ([place]) Stay active. Walking and swimming are great, low-impact exercises that help to lower blood pressure, blood glucose levels, and cholesterol levels.   7. Eczema Triamcinolone cream depomedrol IM now  8. Restless leg syndrome Increase mirapex to 13m nightly   Labs pending Health Maintenance reviewed Diet and exercise encouraged  Follow up plan: Follow up in 3 months.    [name] MHassell Done FNP

## 2020-03-26 NOTE — Patient Instructions (Signed)
Coping with Quitting Smoking  Quitting smoking is a physical and mental challenge. You will face cravings, withdrawal symptoms, and temptation. Before quitting, work with your health care provider to make a plan that can help you cope. Preparation can help you quit and keep you from giving in. How can I cope with cravings? Cravings usually last for 5-10 minutes. If you get through it, the craving will pass. Consider taking the following actions to help you cope with cravings:  Keep your mouth busy: ? Chew sugar-free gum. ? Suck on hard candies or a straw. ? Brush your teeth.  Keep your hands and body busy: ? Immediately change to a different activity when you feel a craving. ? Squeeze or play with a ball. ? Do an activity or a hobby, like making bead jewelry, practicing needlepoint, or working with wood. ? Mix up your normal routine. ? Take a short exercise break. Go for a quick walk or run up and down stairs. ? Spend time in public places where smoking is not allowed.  Focus on doing something kind or helpful for someone else.  Call a friend or family member to talk during a craving.  Join a support group.  Call a quit line, such as 1-800-QUIT-NOW.  Talk with your health care provider about medicines that might help you cope with cravings and make quitting easier for you. How can I deal with withdrawal symptoms? Your body may experience negative effects as it tries to get used to not having nicotine in the system. These effects are called withdrawal symptoms. They may include:  Feeling hungrier than normal.  Trouble concentrating.  Irritability.  Trouble sleeping.  Feeling depressed.  Restlessness and agitation.  Craving a cigarette. To manage withdrawal symptoms:  Avoid places, people, and activities that trigger your cravings.  Remember why you want to quit.  Get plenty of sleep.  Avoid coffee and other caffeinated drinks. These may worsen some of your  symptoms. How can I handle social situations? Social situations can be difficult when you are quitting smoking, especially in the first few weeks. To manage this, you can:  Avoid parties, bars, and other social situations where people might be smoking.  Avoid alcohol.  Leave right away if you have the urge to smoke.  Explain to your family and friends that you are quitting smoking. Ask for understanding and support.  Plan activities with friends or family where smoking is not an option. What are some ways I can cope with stress? Wanting to smoke may cause stress, and stress can make you want to smoke. Find ways to manage your stress. Relaxation techniques can help. For example:  Breathe slowly and deeply, in through your nose and out through your mouth.  Listen to soothing, relaxing music.  Talk with a family member or friend about your stress.  Light a candle.  Soak in a bath or take a shower.  Think about a peaceful place. What are some ways I can prevent weight gain? Be aware that many people gain weight after they quit smoking. However, not everyone does. To keep from gaining weight, have a plan in place before you quit and stick to the plan after you quit. Your plan should include:  Having healthy snacks. When you have a craving, it may help to: ? Eat plain popcorn, crunchy carrots, celery, or other cut vegetables. ? Chew sugar-free gum.  Changing how you eat: ? Eat small portion sizes at meals. ? Eat 4-6 small meals   throughout the day instead of 1-2 large meals a day. ? Be mindful when you eat. Do not watch television or do other things that might distract you as you eat.  Exercising regularly: ? Make time to exercise each day. If you do not have time for a long workout, do short bouts of exercise for 5-10 minutes several times a day. ? Do some form of strengthening exercise, like weight lifting, and some form of aerobic exercise, like running or swimming.  Drinking  plenty of water or other low-calorie or no-calorie drinks. Drink 6-8 glasses of water daily, or as much as instructed by your health care provider. Summary  Quitting smoking is a physical and mental challenge. You will face cravings, withdrawal symptoms, and temptation to smoke again. Preparation can help you as you go through these challenges.  You can cope with cravings by keeping your mouth busy (such as by chewing gum), keeping your body and hands busy, and making calls to family, friends, or a helpline for people who want to quit smoking.  You can cope with withdrawal symptoms by avoiding places where people smoke, avoiding drinks with caffeine, and getting plenty of rest.  Ask your health care provider about the different ways to prevent weight gain, avoid stress, and handle social situations. This information is not intended to replace advice given to you by your health care provider. Make sure you discuss any questions you have with your health care provider. Document Revised: 06/25/2017 Document Reviewed: 07/10/2016 Elsevier Patient Education  2020 Elsevier Inc.  

## 2020-03-27 LAB — CMP14+EGFR
ALT: 9 IU/L (ref 0–32)
AST: 12 IU/L (ref 0–40)
Albumin/Globulin Ratio: 1.8 (ref 1.2–2.2)
Albumin: 4.2 g/dL (ref 3.8–4.8)
Alkaline Phosphatase: 100 IU/L (ref 48–121)
BUN/Creatinine Ratio: 11 — ABNORMAL LOW (ref 12–28)
BUN: 11 mg/dL (ref 8–27)
Bilirubin Total: 0.4 mg/dL (ref 0.0–1.2)
CO2: 25 mmol/L (ref 20–29)
Calcium: 9.3 mg/dL (ref 8.7–10.3)
Chloride: 104 mmol/L (ref 96–106)
Creatinine, Ser: 0.98 mg/dL (ref 0.57–1.00)
GFR calc Af Amer: 71 mL/min/{1.73_m2} (ref >59)
GFR calc non Af Amer: 62 mL/min/{1.73_m2} (ref >59)
Globulin, Total: 2.4 g/dL (ref 1.5–4.5)
Glucose: 115 mg/dL — ABNORMAL HIGH (ref 65–99)
Potassium: 3.7 mmol/L (ref 3.5–5.2)
Sodium: 144 mmol/L (ref 134–144)
Total Protein: 6.6 g/dL (ref 6.0–8.5)

## 2020-03-27 LAB — LIPID PANEL
Chol/HDL Ratio: 3.7 ratio (ref 0.0–4.4)
Cholesterol, Total: 110 mg/dL (ref 100–199)
HDL: 30 mg/dL — ABNORMAL LOW (ref >39)
LDL Chol Calc (NIH): 59 mg/dL (ref 0–99)
Triglycerides: 117 mg/dL (ref 0–149)
VLDL Cholesterol Cal: 21 mg/dL (ref 5–40)

## 2020-03-27 LAB — CBC WITH DIFFERENTIAL/PLATELET
Basophils Absolute: 0.1 10*3/uL (ref 0.0–0.2)
Basos: 0 %
EOS (ABSOLUTE): 0.2 10*3/uL (ref 0.0–0.4)
Eos: 2 %
Hematocrit: 42.5 % (ref 34.0–46.6)
Hemoglobin: 13.1 g/dL (ref 11.1–15.9)
Immature Grans (Abs): 0 10*3/uL (ref 0.0–0.1)
Immature Granulocytes: 0 %
Lymphocytes Absolute: 1.3 10*3/uL (ref 0.7–3.1)
Lymphs: 10 %
MCH: 25.9 pg — ABNORMAL LOW (ref 26.6–33.0)
MCHC: 30.8 g/dL — ABNORMAL LOW (ref 31.5–35.7)
MCV: 84 fL (ref 79–97)
Monocytes Absolute: 0.6 10*3/uL (ref 0.1–0.9)
Monocytes: 5 %
Neutrophils Absolute: 10.7 10*3/uL — ABNORMAL HIGH (ref 1.4–7.0)
Neutrophils: 83 %
Platelets: 253 10*3/uL (ref 150–450)
RBC: 5.05 x10E6/uL (ref 3.77–5.28)
RDW: 14.1 % (ref 11.7–15.4)
WBC: 12.9 10*3/uL — ABNORMAL HIGH (ref 3.4–10.8)

## 2020-03-27 LAB — MICROALBUMIN / CREATININE URINE RATIO
Creatinine, Urine: 89 mg/dL
Microalb/Creat Ratio: 254 mg/g creat — ABNORMAL HIGH (ref 0–29)
Microalbumin, Urine: 226.1 ug/mL

## 2020-04-02 ENCOUNTER — Other Ambulatory Visit: Payer: Self-pay | Admitting: *Deleted

## 2020-04-02 MED ORDER — TRUE METRIX AIR GLUCOSE METER W/DEVICE KIT: PACK | 0 refills | Status: DC

## 2020-04-02 MED ORDER — TRUEPLUS LANCETS 33G MISC: 3 refills | Status: DC

## 2020-04-02 MED ORDER — TRUE METRIX BLOOD GLUCOSE TEST VI STRP: ORAL_STRIP | 3 refills | Status: DC

## 2020-04-18 ENCOUNTER — Telehealth: Payer: Medicare HMO

## 2020-04-28 ENCOUNTER — Other Ambulatory Visit: Payer: Self-pay | Admitting: Nurse Practitioner

## 2020-04-28 DIAGNOSIS — G2581 Restless legs syndrome: Secondary | ICD-10-CM

## 2020-05-06 DIAGNOSIS — Z0100 Encounter for examination of eyes and vision without abnormal findings: Secondary | ICD-10-CM | POA: Diagnosis not present

## 2020-05-06 DIAGNOSIS — H524 Presbyopia: Secondary | ICD-10-CM | POA: Diagnosis not present

## 2020-05-06 LAB — HM DIABETES EYE EXAM

## 2020-05-16 ENCOUNTER — Encounter: Payer: Self-pay | Admitting: Nurse Practitioner

## 2020-05-16 ENCOUNTER — Ambulatory Visit (INDEPENDENT_AMBULATORY_CARE_PROVIDER_SITE_OTHER): Payer: Medicare HMO | Admitting: Nurse Practitioner

## 2020-05-16 ENCOUNTER — Other Ambulatory Visit: Payer: Self-pay

## 2020-05-16 VITALS — BP 169/79 | HR 66 | Temp 98.2°F | Resp 20 | Ht 63.0 in | Wt 201.0 lb

## 2020-05-16 DIAGNOSIS — Z024 Encounter for examination for driving license: Secondary | ICD-10-CM

## 2020-05-16 NOTE — Progress Notes (Signed)
   Subjective:    Patient ID: [name], female    DOB: 10-09-1955, 64 y.o.   MRN: [id number]   Chief Complaint: DMV forms filled out  HPI Patient come sin today to have DMV form filled out. sheis scheduled for chronic follo wup of medical problems next month.   Review of Systems  Constitutional: Negative for diaphoresis.  Eyes: Negative for pain.  Respiratory: Negative for shortness of breath.   Cardiovascular: Negative for chest pain, palpitations and leg swelling.  Gastrointestinal: Negative for abdominal pain.  Endocrine: Negative for polydipsia.  Skin: Negative for rash.  Neurological: Negative for dizziness, weakness and headaches.  Hematological: Does not bruise/bleed easily.  All other systems reviewed and are negative.      Objective:   Physical Exam  NO physical exam done      Assessment & Plan:  [name] in today with chief complaint of No chief complaint on file.   1. Encounter for examination for driving license dmv forms filled out    The above assessment and management plan was discussed with the patient. The patient verbalized understanding of and has agreed to the management plan. Patient is aware to call the clinic if symptoms persist or worsen. Patient is aware when to return to the clinic for a follow-up visit. Patient educated on when it is appropriate to go to the emergency department.   [name], FNP

## 2020-05-18 ENCOUNTER — Other Ambulatory Visit: Payer: Self-pay | Admitting: Nurse Practitioner

## 2020-05-18 DIAGNOSIS — I10 Essential (primary) hypertension: Secondary | ICD-10-CM

## 2020-05-23 ENCOUNTER — Other Ambulatory Visit: Payer: Self-pay | Admitting: Nurse Practitioner

## 2020-05-23 DIAGNOSIS — E7800 Pure hypercholesterolemia, unspecified: Secondary | ICD-10-CM

## 2020-05-23 DIAGNOSIS — I2583 Coronary atherosclerosis due to lipid rich plaque: Secondary | ICD-10-CM

## 2020-05-23 DIAGNOSIS — F3342 Major depressive disorder, recurrent, in full remission: Secondary | ICD-10-CM

## 2020-05-23 DIAGNOSIS — I10 Essential (primary) hypertension: Secondary | ICD-10-CM

## 2020-05-23 DIAGNOSIS — E1159 Type 2 diabetes mellitus with other circulatory complications: Secondary | ICD-10-CM

## 2020-05-23 DIAGNOSIS — I2510 Atherosclerotic heart disease of native coronary artery without angina pectoris: Secondary | ICD-10-CM

## 2020-05-28 ENCOUNTER — Ambulatory Visit: Payer: Medicare HMO | Admitting: Licensed Clinical Social Worker

## 2020-05-28 DIAGNOSIS — I2510 Atherosclerotic heart disease of native coronary artery without angina pectoris: Secondary | ICD-10-CM

## 2020-05-28 DIAGNOSIS — I10 Essential (primary) hypertension: Secondary | ICD-10-CM

## 2020-05-28 DIAGNOSIS — J449 Chronic obstructive pulmonary disease, unspecified: Secondary | ICD-10-CM

## 2020-05-28 DIAGNOSIS — E7800 Pure hypercholesterolemia, unspecified: Secondary | ICD-10-CM

## 2020-05-28 DIAGNOSIS — I2583 Coronary atherosclerosis due to lipid rich plaque: Secondary | ICD-10-CM

## 2020-05-28 DIAGNOSIS — F411 Generalized anxiety disorder: Secondary | ICD-10-CM

## 2020-05-28 NOTE — Patient Instructions (Addendum)
Licensed Clinical Social Worker Visit Information  Materials Provided: No  05/28/2020  Name: [name]            MRN: [id number]       DOB: 1956/01/03  [name] is a 64 y.o. year old female who is a primary care patient of [name], [place]. The CCM team was consulted for assistance with [name] .   Review of patient status, including review of consultants reports, other relevant assessments, and collaboration with appropriate care team members and the patient's provider was performed as part of comprehensive patient evaluation and provision of chronic care management services.    SDOH (Social Determinants of Health) assessments performed: No; risk for depression; risk for tobacco use; risk for financial strain; risk for stress; risk for physical inactivity  [name] called client home phone number several times today but [name] was not able to speak via phone with client today; also, phone answering service on client phone was not set up and thus [name] was not able to leave client a message via phone today  Follow Up Plan: [name] to call client in next 4 weeks to assess the psychosocial needs of client at that time  [name] was not able to speak via phone with client today; thus the client was not able to verbalize understanding of instructions provided today and was not able to accept or decline a print copy of patient instruction materials.   [name].Rica Heather MSW, [name] Licensed Clinical Social Worker [facility] Care Management [phone]

## 2020-05-28 NOTE — Chronic Care Management (AMB) (Signed)
Chronic Care Management    Clinical Social Work Follow Up Note  05/28/2020 Name: [name] MRN: [id number] DOB: [birth year]  [name] is a 64 y.o. year old female who is a primary care patient of [name], [place]. The CCM team was consulted for assistance with [name] .   Review of patient status, including review of consultants reports, other relevant assessments, and collaboration with appropriate care team members and the patient's provider was performed as part of comprehensive patient evaluation and provision of chronic care management services.    SDOH (Social Determinants of Health) assessments performed: No; risk for depression; risk for tobacco use; risk for financial strain; risk for stress; risk for physical inactivity    Office Visit from 03/26/2020 in [facility]  PHQ-9 Total Score 0     GAD 7 : Generalized Anxiety Score 03/26/2020 12/19/2019 09/05/2019 08/07/2019  Nervous, Anxious, on Edge 0 _0 Control/stop worrying _1 Worry too much - different things _2 Trouble relaxing 0 3 0 2  Restless 0 3 0 1  Easily annoyed or irritable 0 3 0 3  Afraid - awful might happen 0 2 0 1  Total GAD 7 Score _3 Anxiety Difficulty Not difficult at all Very difficult Not difficult at all Very difficult     Outpatient Encounter Medications as of 05/28/2020  Medication Sig  . albuterol (PROVENTIL HFA;VENTOLIN HFA) 108 (90 Base) MCG/ACT inhaler Inhale 2 puffs into the lungs every 6 (six) hours as needed for wheezing or shortness of breath.  . Alcohol Swabs (B-D SINGLE USE SWABS REGULAR) PADS check blood sugars twice daily Dx E11.9  . ALPRAZolam (XANAX) 1 MG tablet Take 1 tablet (1 mg total) by mouth 3 (three) times daily as needed for anxiety.  [name] aspirin 325 MG tablet Take 325 mg by mouth daily.    [name] atorvastatin (LIPITOR) 80 MG tablet Take 1 tablet (80 mg total) by mouth daily.  . Blood Glucose Calibration (TRUE METRIX  LEVEL 1) Low SOLN Use with glucose monitor Dx E11.9  . blood glucose meter kit and supplies KIT Dispense based on patient and insurance preference. Use up to four times daily as directed. (FOR ICD-9 250.00, 250.01).  . Blood Glucose Monitoring Suppl (TRUE METRIX AIR GLUCOSE METER) w/Device KIT check blood sugars twice daily Dx E11.9  . Cholecalciferol (VITAMIN D3) 5000 units TABS Take 1 tablet by mouth daily.  [name] escitalopram (LEXAPRO) 20 MG tablet TAKE 1 TABLET EVERY DAY  . fish oil-omega-3 fatty acids 1000 MG capsule Take 2 g by mouth 2 (two) times daily.   . Fluticasone-Umeclidin-Vilant (TRELEGY ELLIPTA) 100-62.5-25 MCG/INH AEPB Inhale 1 puff into the lungs daily.  . furosemide (LASIX) 20 MG tablet TAKE 1 TABLET EVERY DAY AS NEEDED  . glucose blood (TRUE METRIX BLOOD GLUCOSE TEST) test strip Use to check blood sugars twice daily Dx E11.9  . hydrOXYzine (VISTARIL) 25 MG capsule TAKE 1 CAPSULE THREE TIMES DAILY AS NEEDED  . isosorbide mononitrate (IMDUR) 30 MG 24 hr tablet Take 0.5 tablets (15 mg total) by mouth daily.  [name] lisinopril (ZESTRIL) 40 MG tablet TAKE 1 TABLET EVERY DAY  . metFORMIN (GLUCOPHAGE) 500 MG tablet TAKE 1 TABLET TWICE DAILY WITH MEALS  . metoprolol tartrate (LOPRESSOR) 25 MG tablet TAKE 1 TABLET TWICE DAILY  . pramipexole (MIRAPEX) 1 MG tablet TAKE 1 TABLET THREE TIMES DAILY  . triamcinolone cream (KENALOG)  0.1 % Apply 1 application topically 2 (two) times daily.  . TRUEplus Lancets 33G MISC Use to check blood sugars twice daily Dx E11.9  . vitamin B-12 (CYANOCOBALAMIN) 1000 MCG tablet Take 1,000 mcg by mouth daily.   No facility-administered encounter medications on file as of 05/28/2020.    [name] called client home phone number several times today but [name] was not able to speak via phone with client today; also, phone answering service on client phone was not set up and thus [name] was not able to leave client a message via phone today  Follow Up Plan: [name] to call client in  next 4 weeks to assess the psychosocial needs of client at that time  [name].Jourdon Zimmerle MSW, [name] Licensed Clinical Social Worker [facility] Care [facility] [phone]

## 2020-06-14 ENCOUNTER — Telehealth: Payer: Self-pay

## 2020-06-14 DIAGNOSIS — R21 Rash and other nonspecific skin eruption: Secondary | ICD-10-CM

## 2020-06-14 MED ORDER — HYDROXYZINE PAMOATE 25 MG PO CAPS: ORAL_CAPSULE | ORAL | 0 refills | Status: DC

## 2020-06-14 NOTE — Telephone Encounter (Signed)
Patient is requesting refill on Hydroxyzine

## 2020-06-14 NOTE — Addendum Note (Signed)
Addended by: [name] on: 06/14/2020 04:49 PM   Modules accepted: Orders

## 2020-06-14 NOTE — Telephone Encounter (Signed)
Prescription sent to pharmacy.

## 2020-06-14 NOTE — Telephone Encounter (Signed)
I do not recall this because last time I saw her was for [facility]  papers- I can do flonase is that what she wants

## 2020-06-27 ENCOUNTER — Ambulatory Visit (INDEPENDENT_AMBULATORY_CARE_PROVIDER_SITE_OTHER): Payer: Medicare HMO | Admitting: Nurse Practitioner

## 2020-06-27 ENCOUNTER — Encounter: Payer: Self-pay | Admitting: Nurse Practitioner

## 2020-06-27 ENCOUNTER — Other Ambulatory Visit: Payer: Self-pay

## 2020-06-27 VITALS — BP 163/69 | HR 69 | Temp 98.0°F | Resp 20 | Ht 63.0 in | Wt 206.0 lb

## 2020-06-27 DIAGNOSIS — I6523 Occlusion and stenosis of bilateral carotid arteries: Secondary | ICD-10-CM

## 2020-06-27 DIAGNOSIS — J449 Chronic obstructive pulmonary disease, unspecified: Secondary | ICD-10-CM | POA: Diagnosis not present

## 2020-06-27 DIAGNOSIS — E559 Vitamin D deficiency, unspecified: Secondary | ICD-10-CM

## 2020-06-27 DIAGNOSIS — I2510 Atherosclerotic heart disease of native coronary artery without angina pectoris: Secondary | ICD-10-CM

## 2020-06-27 DIAGNOSIS — I10 Essential (primary) hypertension: Secondary | ICD-10-CM | POA: Diagnosis not present

## 2020-06-27 DIAGNOSIS — C50212 Malignant neoplasm of upper-inner quadrant of left female breast: Secondary | ICD-10-CM

## 2020-06-27 DIAGNOSIS — E7800 Pure hypercholesterolemia, unspecified: Secondary | ICD-10-CM

## 2020-06-27 DIAGNOSIS — G2581 Restless legs syndrome: Secondary | ICD-10-CM

## 2020-06-27 DIAGNOSIS — F3342 Major depressive disorder, recurrent, in full remission: Secondary | ICD-10-CM | POA: Diagnosis not present

## 2020-06-27 DIAGNOSIS — E1159 Type 2 diabetes mellitus with other circulatory complications: Secondary | ICD-10-CM

## 2020-06-27 DIAGNOSIS — F411 Generalized anxiety disorder: Secondary | ICD-10-CM

## 2020-06-27 DIAGNOSIS — I2583 Coronary atherosclerosis due to lipid rich plaque: Secondary | ICD-10-CM

## 2020-06-27 LAB — BAYER DCA HB A1C WAIVED: HB A1C (BAYER DCA - WAIVED): 5.9 % (ref <7.0)

## 2020-06-27 MED ORDER — LISINOPRIL 40 MG PO TABS: 40.0000 mg | ORAL_TABLET | Freq: Every day | ORAL | 1 refills | Status: DC

## 2020-06-27 MED ORDER — ALPRAZOLAM 1 MG PO TABS: 1.0000 mg | ORAL_TABLET | Freq: Three times a day (TID) | ORAL | 2 refills | Status: DC | PRN

## 2020-06-27 MED ORDER — AMLODIPINE BESYLATE 10 MG PO TABS: 10.0000 mg | ORAL_TABLET | Freq: Every day | ORAL | 1 refills | Status: DC

## 2020-06-27 MED ORDER — BREO ELLIPTA 200-25 MCG/INH IN AEPB: 1.0000 | INHALATION_SPRAY | Freq: Every day | RESPIRATORY_TRACT | 1 refills | Status: DC

## 2020-06-27 MED ORDER — METFORMIN HCL 500 MG PO TABS: 500.0000 mg | ORAL_TABLET | Freq: Two times a day (BID) | ORAL | 1 refills | Status: DC

## 2020-06-27 MED ORDER — ISOSORBIDE MONONITRATE ER 30 MG PO TB24: 15.0000 mg | ORAL_TABLET | Freq: Every day | ORAL | 1 refills | Status: DC

## 2020-06-27 MED ORDER — ESCITALOPRAM OXALATE 20 MG PO TABS: 20.0000 mg | ORAL_TABLET | Freq: Every day | ORAL | 1 refills | Status: DC

## 2020-06-27 MED ORDER — PRAMIPEXOLE DIHYDROCHLORIDE 1 MG PO TABS: 1.0000 mg | ORAL_TABLET | Freq: Three times a day (TID) | ORAL | 1 refills | Status: DC

## 2020-06-27 NOTE — Progress Notes (Signed)
Subjective:    Patient ID: [name], female    DOB: April 21, 1956, 64 y.o.   MRN: [id number]   Chief Complaint: Medical Management of Chronic Issues    HPI:  1. Pure hypercholesterolemia Does not watch diet and does very little exercise. Lab Results  Component Value Date   CHOL 110 03/26/2020   HDL 30 (L) 03/26/2020   LDLCALC 59 03/26/2020   LDLDIRECT 100.0 05/16/2007   TRIG 117 03/26/2020   CHOLHDL 3.7 03/26/2020     2. Type 2 diabetes mellitus with other circulatory complication, without long-term current use of insulin (HCC) She does not check her blood sugars at home. She has not been watching her diet or doing any exercise. Lab Results  Component Value Date   HGBA1C 6.2 03/26/2020     3. Primary hypertension No c/o chest pain, sob or headache. Does not check her blood pressure at home. BP Readings from Last 3 Encounters:  05/16/20 (!) 169/79  03/26/20 (!) 189/83  12/19/19 120/65     4. Bilateral carotid artery stenosis Denies nay near syncopial episodes. Last carotid doppler study was done 08/25/18. Showed only minor stenosis bilaterally.  5. Coronary artery disease due to lipid rich plaque She has nit seen her cardiologist in awhile. According to her chart , they attempted to contact her in March 2021 to make her appointment but they were unable to reach her. She is on imdur daily  6. Chronic obstructive pulmonary disease, unspecified COPD type ([place]) She has occasional sob with strenuous activity. She says she is doing well. She is currently on BREO daily. She is still smoking over a pack a day.  7. Recurrent major depressive disorder, in full remission [facility]) She is currently on lexapro and is doing well. Denies nay medication side effects. She says she feels BLAH today. Depression screen [facility] 2/9 06/27/2020 03/26/2020 03/26/2020  Decreased Interest 3 0 0  Down, Depressed, Hopeless 2 0 0  PHQ - 2 Score 5 0 0  Altered sleeping 3 0 -  Tired, decreased  energy 3 0 -  Change in appetite 3 0 -  Feeling bad or failure about yourself  2 0 -  Trouble concentrating 3 0 -  Moving slowly or fidgety/restless 0 0 -  Suicidal thoughts 0 0 -  PHQ-9 Score 19 0 -  Difficult doing work/chores Not difficult at all Not difficult at all -  Some recent data might be hidden     8. Generalized anxiety disorder She is on xanax 3x a day. We have attampted to wean her down to BID with no success. Not a good time of year for her. GAD 7 : Generalized Anxiety Score 06/27/2020 03/26/2020 12/19/2019 09/05/2019  Nervous, Anxious, on Edge 1 0 3 1  Control/stop worrying _0 Worry too much - different things _1 Trouble relaxing 1 0 3 0  Restless 0 0 3 0  Easily annoyed or irritable 1 0 3 0  Afraid - awful might happen 0 0 2 0  Total GAD 7 Score _2 Anxiety Difficulty Somewhat difficult Not difficult at all Very difficult Not difficult at all      9. Malignant neoplasm of upper-inner quadrant of left female breast, unspecified estrogen receptor status ([place]) Breast cancer was in 2013. She has had no reoccurence.  10. Vitamin D deficiency She is on a daily vitamin d supplement  11. Restless leg syndrome She takes  mirapex daily and is doing well.   12. Morbid obesity ([place]) Weight is up 5lbs from last visit  Wt Readings from Last 3 Encounters:  06/27/20 206 lb (93.4 kg)  05/16/20 201 lb (91.2 kg)  03/26/20 207 lb (93.9 kg)   BMI Readings from Last 3 Encounters:  06/27/20 36.49 kg/m  05/16/20 35.61 kg/m  03/26/20 36.67 kg/m       Outpatient Encounter Medications as of 06/27/2020  Medication Sig  . albuterol (PROVENTIL HFA;VENTOLIN HFA) 108 (90 Base) MCG/ACT inhaler Inhale 2 puffs into the lungs every 6 (six) hours as needed for wheezing or shortness of breath.  . Alcohol Swabs (B-D SINGLE USE SWABS REGULAR) PADS check blood sugars twice daily Dx E11.9  . ALPRAZolam (XANAX) 1 MG tablet Take 1 tablet (1 mg total) by mouth 3 (three)  times daily as needed for anxiety.  [name] aspirin 325 MG tablet Take 325 mg by mouth daily.    [name] atorvastatin (LIPITOR) 80 MG tablet Take 1 tablet (80 mg total) by mouth daily.  . Blood Glucose Calibration (TRUE METRIX LEVEL 1) Low SOLN Use with glucose monitor Dx E11.9  . blood glucose meter kit and supplies KIT Dispense based on patient and insurance preference. Use up to four times daily as directed. (FOR ICD-9 250.00, 250.01).  . Blood Glucose Monitoring Suppl (TRUE METRIX AIR GLUCOSE METER) w/Device KIT check blood sugars twice daily Dx E11.9  . Cholecalciferol (VITAMIN D3) 5000 units TABS Take 1 tablet by mouth daily.  [name] escitalopram (LEXAPRO) 20 MG tablet TAKE 1 TABLET EVERY DAY  . fish oil-omega-3 fatty acids 1000 MG capsule Take 2 g by mouth 2 (two) times daily.   . Fluticasone-Umeclidin-Vilant (TRELEGY ELLIPTA) 100-62.5-25 MCG/INH AEPB Inhale 1 puff into the lungs daily.  . furosemide (LASIX) 20 MG tablet TAKE 1 TABLET EVERY DAY AS NEEDED  . glucose blood (TRUE METRIX BLOOD GLUCOSE TEST) test strip Use to check blood sugars twice daily Dx E11.9  . hydrOXYzine (VISTARIL) 25 MG capsule TAKE 1 CAPSULE THREE TIMES DAILY AS NEEDED  . isosorbide mononitrate (IMDUR) 30 MG 24 hr tablet Take 0.5 tablets (15 mg total) by mouth daily.  [name] lisinopril (ZESTRIL) 40 MG tablet TAKE 1 TABLET EVERY DAY  . metFORMIN (GLUCOPHAGE) 500 MG tablet TAKE 1 TABLET TWICE DAILY WITH MEALS  . metoprolol tartrate (LOPRESSOR) 25 MG tablet TAKE 1 TABLET TWICE DAILY  . pramipexole (MIRAPEX) 1 MG tablet TAKE 1 TABLET THREE TIMES DAILY  . triamcinolone cream (KENALOG) 0.1 % Apply 1 application topically 2 (two) times daily.  . TRUEplus Lancets 33G MISC Use to check blood sugars twice daily Dx E11.9  . vitamin B-12 (CYANOCOBALAMIN) 1000 MCG tablet Take 1,000 mcg by mouth daily.   No facility-administered encounter medications on file as of 06/27/2020.    Past Surgical History:  Procedure Laterality Date  . AXILLARY LYMPH  NODE DISSECTION  07/31/2011   Procedure: AXILLARY LYMPH NODE DISSECTION;  Surgeon: [name], MD;  Location: [place];  Service: General;  Laterality: Left;  left axillary sentinal node biopsy  . BREAST SURGERY    . CARDIAC CATHETERIZATION  11/02/2006; 06/03/2011  . CESAREAN SECTION  1987  . CORONARY STENT PLACEMENT  11/02/2006  . ELBOW SURGERY     right  . MASTECTOMY PARTIAL / LUMPECTOMY  06/30/2011   left    Family History  Problem Relation Age of Onset  . Heart disease Mother   . Heart disease Father   .  Cancer Maternal Aunt        pt unaware of what kind  . Cancer Maternal Grandmother        ovarian  . Heart attack Sister   . Thyroid disease Daughter   . Anxiety disorder Daughter   . Depression Daughter   . Bipolar disorder Sister     New complaints: None today  Social history: Lives by herself  Controlled substance contract: 06/27/20    Review of Systems  Constitutional: Negative for diaphoresis.  Eyes: Negative for pain.  Respiratory: Negative for shortness of breath.   Cardiovascular: Negative for chest pain, palpitations and leg swelling.  Gastrointestinal: Negative for abdominal pain.  Endocrine: Negative for polydipsia.  Skin: Negative for rash.  Neurological: Negative for dizziness, weakness and headaches.  Hematological: Does not bruise/bleed easily.  All other systems reviewed and are negative.      Objective:   Physical Exam Vitals and nursing note reviewed.  Constitutional:      General: She is not in acute distress.    Appearance: Normal appearance. She is well-developed.  HENT:     Head: Normocephalic.     Nose: Nose normal.  Eyes:     Pupils: Pupils are equal, round, and reactive to light.  Neck:     Vascular: Carotid bruit present. No JVD.  Cardiovascular:     Rate and Rhythm: Normal rate and regular rhythm.     Heart sounds: Normal heart sounds.  Pulmonary:     Effort: Pulmonary effort is normal. No respiratory  distress.     Breath sounds: Normal breath sounds. No wheezing or rales.  Chest:     Chest wall: No tenderness.  Abdominal:     General: Bowel sounds are normal. There is no distension or abdominal bruit.     Palpations: Abdomen is soft. There is no hepatomegaly, splenomegaly, mass or pulsatile mass.     Tenderness: There is no abdominal tenderness.  Musculoskeletal:        General: Normal range of motion.     Cervical back: Normal range of motion and neck supple.     Right lower leg: Edema (1+) present.     Left lower leg: Edema (1+) present.  Lymphadenopathy:     Cervical: No cervical adenopathy.  Skin:    General: Skin is warm and dry.  Neurological:     Mental Status: She is alert and oriented to person, place, and time.     Deep Tendon Reflexes: Reflexes are normal and symmetric.  Psychiatric:        Behavior: Behavior normal.        Thought Content: Thought content normal.        Judgment: Judgment normal.    BP (!) 163/69   Pulse 69   Temp 98 F (36.7 C) (Temporal)   Resp 20   Ht 5' 3" (1.6 m)   Wt 206 lb (93.4 kg)   LMP 10/17/2005   SpO2 90%   BMI 36.49 kg/m        Assessment & Plan:  [name] comes in today with chief complaint of Medical Management of Chronic Issues   Diagnosis and orders addressed:  1. Pure hypercholesterolemia Low fat diet - Lipid panel  2. Type 2 diabetes mellitus with other circulatory complication, without long-term current use of insulin (HCC) Continue to watch carbs in diet - Bayer DCA Hb A1c Waived - metFORMIN (GLUCOPHAGE) 500 MG tablet; Take 1 tablet (500 mg total) by mouth 2 (  two) times daily with a meal.  Dispense: 180 tablet; Refill: 1  3. Primary hypertension Low sodium diet- lisinopril (ZESTRIL) 40 MG tablet; Take 1 tablet (40 mg total) by mouth daily.  Dispense: 90 tablet; Refill: 1 Added amlodipine - CBC with Differential/Platelet - CMP14+EGFR  4. Bilateral carotid artery stenosis Will reck next  year  5. Coronary artery disease due to lipid rich plaque Needs to follow up cardiology  6. Chronic obstructive pulmonary disease, unspecified COPD type ([place]) Continue BREO  7. Recurrent major depressive disorder, in full remission ([place]) Stress management - escitalopram (LEXAPRO) 20 MG tablet; Take 1 tablet (20 mg total) by mouth daily.  Dispense: 90 tablet; Refill: 1  8. Generalized anxiety disorder - ALPRAZolam (XANAX) 1 MG tablet; Take 1 tablet (1 mg total) by mouth 3 (three) times daily as needed for anxiety.  Dispense: 90 tablet; Refill: 2  9. Malignant neoplasm of upper-inner quadrant of left female breast, unspecified estrogen receptor status ([place])  10. Vitamin D deficiency continue daily vitamin d supplement  11. Restless leg syndrome Keep legs warm at night - pramipexole (MIRAPEX) 1 MG tablet; Take 1 tablet (1 mg total) by mouth 3 (three) times daily.  Dispense: 270 tablet; Refill: 1  12. Morbid obesity ([place]) Discussed diet and exercise for person with BMI >25 Will recheck weight in 3-6 months   13. Atherosclerosis of native coronary artery without angina pectoris, unspecified whether native or transplanted heart - isosorbide mononitrate (IMDUR) 30 MG 24 hr tablet; Take 0.5 tablets (15 mg total) by mouth daily.  Dispense: 90 tablet; Refill: 1    Labs pending Health Maintenance reviewed Diet and exercise encouraged  Follow up plan: 3 months   [name], FNP

## 2020-06-27 NOTE — Patient Instructions (Signed)
Steps to Quit Smoking Smoking tobacco is the leading cause of preventable death. It can affect almost every organ in the body. Smoking puts you and people around you at risk for many serious, long-lasting (chronic) diseases. Quitting smoking can be hard, but it is one of the best things that you can do for your health. It is never too late to quit. How do I get ready to quit? When you decide to quit smoking, make a plan to help you succeed. Before you quit:  Pick a date to quit. Set a date within the next 2 weeks to give you time to prepare.  Write down the reasons why you are quitting. Keep this list in places where you will see it often.  Tell your family, friends, and co-workers that you are quitting. Their support is important.  Talk with your doctor about the choices that may help you quit.  Find out if your health insurance will pay for these treatments.  Know the people, places, things, and activities that make you want to smoke (triggers). Avoid them. What first steps can I take to quit smoking?  Throw away all cigarettes at home, at work, and in your car.  Throw away the things that you use when you smoke, such as ashtrays and lighters.  Clean your car. Make sure to empty the ashtray.  Clean your home, including curtains and carpets. What can I do to help me quit smoking? Talk with your doctor about taking medicines and seeing a counselor at the same time. You are more likely to succeed when you do both.  If you are pregnant or breastfeeding, talk with your doctor about counseling or other ways to quit smoking. Do not take medicine to help you quit smoking unless your doctor tells you to do so. To quit smoking: Quit right away  Quit smoking totally, instead of slowly cutting back on how much you smoke over a period of time.  Go to counseling. You are more likely to quit if you go to counseling sessions regularly. Take medicine You may take medicines to help you quit. Some  medicines need a prescription, and some you can buy over-the-counter. Some medicines may contain a drug called nicotine to replace the nicotine in cigarettes. Medicines may:  Help you to stop having the desire to smoke (cravings).  Help to stop the problems that come when you stop smoking (withdrawal symptoms). Your doctor may ask you to use:  Nicotine patches, gum, or lozenges.  Nicotine inhalers or sprays.  Non-nicotine medicine that is taken by mouth. Find resources Find resources and other ways to help you quit smoking and remain smoke-free after you quit. These resources are most helpful when you use them often. They include:  Online chats with a counselor.  Phone quitlines.  Printed self-help materials.  Support groups or group counseling.  Text messaging programs.  Mobile phone apps. Use apps on your mobile phone or tablet that can help you stick to your quit plan. There are many free apps for mobile phones and tablets as well as websites. Examples include Quit Guide from the CDC and smokefree.gov  What things can I do to make it easier to quit?   Talk to your family and friends. Ask them to support and encourage you.  Call a phone quitline (1-800-QUIT-NOW), reach out to support groups, or work with a counselor.  Ask people who smoke to not smoke around you.  Avoid places that make you want to smoke,   such as: ? Bars. ? Parties. ? Smoke-break areas at work.  Spend time with people who do not smoke.  Lower the stress in your life. Stress can make you want to smoke. Try these things to help your stress: ? Getting regular exercise. ? Doing deep-breathing exercises. ? Doing yoga. ? Meditating. ? Doing a body scan. To do this, close your eyes, focus on one area of your body at a time from head to toe. Notice which parts of your body are tense. Try to relax the muscles in those areas. How will I feel when I quit smoking? Day 1 to 3 weeks Within the first 24 hours,  you may start to have some problems that come from quitting tobacco. These problems are very bad 2-3 days after you quit, but they do not often last for more than 2-3 weeks. You may get these symptoms:  Mood swings.  Feeling restless, nervous, angry, or annoyed.  Trouble concentrating.  Dizziness.  Strong desire for high-sugar foods and nicotine.  Weight gain.  Trouble pooping (constipation).  Feeling like you may vomit (nausea).  Coughing or a sore throat.  Changes in how the medicines that you take for other issues work in your body.  Depression.  Trouble sleeping (insomnia). Week 3 and afterward After the first 2-3 weeks of quitting, you may start to notice more positive results, such as:  Better sense of smell and taste.  Less coughing and sore throat.  Slower heart rate.  Lower blood pressure.  Clearer skin.  Better breathing.  Fewer sick days. Quitting smoking can be hard. Do not give up if you fail the first time. Some people need to try a few times before they succeed. Do your best to stick to your quit plan, and talk with your doctor if you have any questions or concerns. Summary  Smoking tobacco is the leading cause of preventable death. Quitting smoking can be hard, but it is one of the best things that you can do for your health.  When you decide to quit smoking, make a plan to help you succeed.  Quit smoking right away, not slowly over a period of time.  When you start quitting, seek help from your doctor, family, or friends. This information is not intended to replace advice given to you by your health care provider. Make sure you discuss any questions you have with your health care provider. Document Revised: 04/07/2019 Document Reviewed: 10/01/2018 Elsevier Patient Education  2020 Elsevier Inc.  

## 2020-06-28 LAB — LIPID PANEL
Chol/HDL Ratio: 4 ratio (ref 0.0–4.4)
Cholesterol, Total: 152 mg/dL (ref 100–199)
HDL: 38 mg/dL — ABNORMAL LOW (ref >39)
LDL Chol Calc (NIH): 86 mg/dL (ref 0–99)
Triglycerides: 158 mg/dL — ABNORMAL HIGH (ref 0–149)
VLDL Cholesterol Cal: 28 mg/dL (ref 5–40)

## 2020-06-28 LAB — CBC WITH DIFFERENTIAL/PLATELET
Basophils Absolute: 0.1 10*3/uL (ref 0.0–0.2)
Basos: 1 %
EOS (ABSOLUTE): 0.3 10*3/uL (ref 0.0–0.4)
Eos: 2 %
Hematocrit: 43 % (ref 34.0–46.6)
Hemoglobin: 13.1 g/dL (ref 11.1–15.9)
Immature Grans (Abs): 0 10*3/uL (ref 0.0–0.1)
Immature Granulocytes: 0 %
Lymphocytes Absolute: 1.1 10*3/uL (ref 0.7–3.1)
Lymphs: 10 %
MCH: 23.1 pg — ABNORMAL LOW (ref 26.6–33.0)
MCHC: 30.5 g/dL — ABNORMAL LOW (ref 31.5–35.7)
MCV: 76 fL — ABNORMAL LOW (ref 79–97)
Monocytes Absolute: 0.6 10*3/uL (ref 0.1–0.9)
Monocytes: 6 %
Neutrophils Absolute: 8.8 10*3/uL — ABNORMAL HIGH (ref 1.4–7.0)
Neutrophils: 81 %
Platelets: 279 10*3/uL (ref 150–450)
RBC: 5.67 x10E6/uL — ABNORMAL HIGH (ref 3.77–5.28)
RDW: 20.7 % — ABNORMAL HIGH (ref 11.7–15.4)
WBC: 10.8 10*3/uL (ref 3.4–10.8)

## 2020-06-28 LAB — CMP14+EGFR
ALT: 11 IU/L (ref 0–32)
AST: 9 IU/L (ref 0–40)
Albumin/Globulin Ratio: 1.5 (ref 1.2–2.2)
Albumin: 3.8 g/dL (ref 3.8–4.8)
Alkaline Phosphatase: 95 IU/L (ref 44–121)
BUN/Creatinine Ratio: 14 (ref 12–28)
BUN: 12 mg/dL (ref 8–27)
Bilirubin Total: 0.3 mg/dL (ref 0.0–1.2)
CO2: 28 mmol/L (ref 20–29)
Calcium: 9.4 mg/dL (ref 8.7–10.3)
Chloride: 105 mmol/L (ref 96–106)
Creatinine, Ser: 0.85 mg/dL (ref 0.57–1.00)
GFR calc Af Amer: 84 mL/min/{1.73_m2} (ref >59)
GFR calc non Af Amer: 73 mL/min/{1.73_m2} (ref >59)
Globulin, Total: 2.5 g/dL (ref 1.5–4.5)
Glucose: 112 mg/dL — ABNORMAL HIGH (ref 65–99)
Potassium: 4.8 mmol/L (ref 3.5–5.2)
Sodium: 146 mmol/L — ABNORMAL HIGH (ref 134–144)
Total Protein: 6.3 g/dL (ref 6.0–8.5)

## 2020-07-02 ENCOUNTER — Ambulatory Visit: Payer: Medicare HMO | Admitting: Licensed Clinical Social Worker

## 2020-07-02 DIAGNOSIS — J449 Chronic obstructive pulmonary disease, unspecified: Secondary | ICD-10-CM

## 2020-07-02 DIAGNOSIS — E7800 Pure hypercholesterolemia, unspecified: Secondary | ICD-10-CM

## 2020-07-02 DIAGNOSIS — I2510 Atherosclerotic heart disease of native coronary artery without angina pectoris: Secondary | ICD-10-CM

## 2020-07-02 DIAGNOSIS — I10 Essential (primary) hypertension: Secondary | ICD-10-CM

## 2020-07-02 DIAGNOSIS — I2583 Coronary atherosclerosis due to lipid rich plaque: Secondary | ICD-10-CM

## 2020-07-02 NOTE — Chronic Care Management (AMB) (Signed)
Chronic Care Management    Clinical Social Work Follow Up Note  07/02/2020 Name: [name] MRN: [id number] DOB: [birth year]  [name] is a 64 y.o. year old female who is a primary care patient of [name], [place]. The CCM team was consulted for assistance with [name] .   Review of patient status, including review of consultants reports, other relevant assessments, and collaboration with appropriate care team members and the patient's provider was performed as part of comprehensive patient evaluation and provision of chronic care management services.    SDOH (Social Determinants of Health) assessments performed: No;risk for depression; risk for risk for tobacco use; risk for stress; risk for physical inactivity    Office Visit from 06/27/2020 in [place]  PHQ-9 Total Score 19       GAD 7 : Generalized Anxiety Score 06/27/2020 03/26/2020 12/19/2019 09/05/2019  Nervous, Anxious, on Edge 1 0 3 1  Control/stop worrying _0 Worry too much - different things _1 Trouble relaxing 1 0 3 0  Restless 0 0 3 0  Easily annoyed or irritable 1 0 3 0  Afraid - awful might happen 0 0 2 0  Total GAD 7 Score _2 Anxiety Difficulty Somewhat difficult Not difficult at all Very difficult Not difficult at all    Outpatient Encounter Medications as of 07/02/2020  Medication Sig  . albuterol (PROVENTIL HFA;VENTOLIN HFA) 108 (90 Base) MCG/ACT inhaler Inhale 2 puffs into the lungs every 6 (six) hours as needed for wheezing or shortness of breath.  . Alcohol Swabs (B-D SINGLE USE SWABS REGULAR) PADS check blood sugars twice daily Dx E11.9  . ALPRAZolam (XANAX) 1 MG tablet Take 1 tablet (1 mg total) by mouth 3 (three) times daily as needed for anxiety.  [name] amLODipine (NORVASC) 10 MG tablet Take 1 tablet (10 mg total) by mouth daily.  [name] aspirin 325 MG tablet Take 325 mg by mouth daily.    [name] atorvastatin (LIPITOR) 80 MG tablet Take 1 tablet (80  mg total) by mouth daily.  . Blood Glucose Calibration (TRUE METRIX LEVEL 1) Low SOLN Use with glucose monitor Dx E11.9  . Blood Glucose Monitoring Suppl (TRUE METRIX AIR GLUCOSE METER) w/Device KIT check blood sugars twice daily Dx E11.9  . Cholecalciferol (VITAMIN D3) 5000 units TABS Take 1 tablet by mouth daily.  [name] escitalopram (LEXAPRO) 20 MG tablet Take 1 tablet (20 mg total) by mouth daily.  . fish oil-omega-3 fatty acids 1000 MG capsule Take 2 g by mouth 2 (two) times daily.   . fluticasone furoate-vilanterol (BREO ELLIPTA) 200-25 MCG/INH AEPB Inhale 1 puff into the lungs daily.  . furosemide (LASIX) 20 MG tablet TAKE 1 TABLET EVERY DAY AS NEEDED  . glucose blood (TRUE METRIX BLOOD GLUCOSE TEST) test strip Use to check blood sugars twice daily Dx E11.9  . hydrOXYzine (VISTARIL) 25 MG capsule TAKE 1 CAPSULE THREE TIMES DAILY AS NEEDED  . isosorbide mononitrate (IMDUR) 30 MG 24 hr tablet Take 0.5 tablets (15 mg total) by mouth daily.  [name] lisinopril (ZESTRIL) 40 MG tablet Take 1 tablet (40 mg total) by mouth daily.  . metFORMIN (GLUCOPHAGE) 500 MG tablet Take 1 tablet (500 mg total) by mouth 2 (two) times daily with a meal.  . metoprolol tartrate (LOPRESSOR) 25 MG tablet TAKE 1 TABLET TWICE DAILY  . pramipexole (MIRAPEX) 1 MG tablet Take 1 tablet (1 mg total) by mouth  3 (three) times daily.  [name] triamcinolone cream (KENALOG) 0.1 % Apply 1 application topically 2 (two) times daily.  . TRUEplus Lancets 33G MISC Use to check blood sugars twice daily Dx E11.9  . vitamin B-12 (CYANOCOBALAMIN) 1000 MCG tablet Take 1,000 mcg by mouth daily.   No facility-administered encounter medications on file as of 07/02/2020.    [name] called client home number several times today but [name] was not able to speak via phone with client. Phone message was not set up on client's phone and thus [name] was not able to leave phone message for client. [name] also called [name], daughter of client, today ; but, [name] was not  able to speak via phone with [name] today. Phone message was not set up on daughter's phone and thus [name] was not able to leave phone message for daughter of client today  Follow Up Plan: [name] to call client/daughter in next 4 weeks to assess the psychosocial needs of client at that time  [name].Jaskirat Zertuche MSW, [name] Licensed Clinical Social Worker [facility] Care Management [phone]

## 2020-07-02 NOTE — Patient Instructions (Addendum)
Licensed Clinical Social Worker Visit Information  Materials Provided: No  07/02/2020  Name: [name]            MRN: [id number]       DOB: September 25, 1955  [name] is a 64 y.o. year old female who is a primary care patient of [name], [name]. The CCM team was consulted for assistance with [name] .   Review of patient status, including review of consultants reports, other relevant assessments, and collaboration with appropriate care team members and the patient's provider was performed as part of comprehensive patient evaluation and provision of chronic care management services.    SDOH (Social Determinants of Health) assessments performed: No;risk for depression; risk for risk for tobacco use; risk for stress; risk for physical inactivity  [name] called client home number several times today but [name] was not able to speak via phone with client. Phone message was not set up on client's phone and thus [name] was not able to leave phone message for client. [name] also called [name], daughter of client, today ; but, [name] was not able to speak via phone with [name] today. Phone message was not set up on daughter's phone and thus [name] was not able to leave phone message for daughter of client today  Follow Up Plan:[name] to call client/daughter in next 4 weeks to assess the psychosocial needs of client at that time  [name] was not able to speak with client or her daughter via phone today; thus, client or her  daughter were not able to verbalize understanding of instructions provided today and were not able to accept or decline a print copy of patient instruction materials.   [name].[name] MSW, [name] Licensed Clinical Social Worker [facility] Care Management [phone]

## 2020-08-05 ENCOUNTER — Other Ambulatory Visit: Payer: Self-pay | Admitting: Nurse Practitioner

## 2020-08-05 DIAGNOSIS — R21 Rash and other nonspecific skin eruption: Secondary | ICD-10-CM

## 2020-08-07 ENCOUNTER — Ambulatory Visit: Payer: Medicare HMO | Admitting: Licensed Clinical Social Worker

## 2020-08-07 DIAGNOSIS — I10 Essential (primary) hypertension: Secondary | ICD-10-CM

## 2020-08-07 DIAGNOSIS — E7800 Pure hypercholesterolemia, unspecified: Secondary | ICD-10-CM

## 2020-08-07 DIAGNOSIS — J441 Chronic obstructive pulmonary disease with (acute) exacerbation: Secondary | ICD-10-CM

## 2020-08-07 DIAGNOSIS — I2510 Atherosclerotic heart disease of native coronary artery without angina pectoris: Secondary | ICD-10-CM

## 2020-08-07 DIAGNOSIS — F411 Generalized anxiety disorder: Secondary | ICD-10-CM

## 2020-08-07 NOTE — Patient Instructions (Addendum)
Licensed Clinical Social Worker Visit Information  Materials Provided: No  08/07/2020  Name: [name]            MRN: [id number]       DOB: 16-Nov-1955  [name] is a 65 y.o. year old female who is a primary care patient of [name], [place]. The CCM team was consulted for assistance with [name] .   Review of patient status, including review of consultants reports, other relevant assessments, and collaboration with appropriate care team members and the patient's provider was performed as part of comprehensive patient evaluation and provision of chronic care management services.    SDOH (Social Determinants of Health) assessments performed: No; risk for depression; risk for tobacco use; risk for stress; risk for physical inactivity  [name] called client home phone number today several times but [name] was not able to speak via phone with client. Also, phone answering machine was not set up for client and thus [name] could not leave a phone message today for client.    Follow Up Plan: [name] to call client in next 4 weeks to assess the psychosocial needs of client at that time  [name] was not able to speak via phone today with client; thus client was not able to verbalize understanding of instructions provided today and was not able to accept or decline a print copy of patient instruction materials.   [name].Okey Zelek MSW, [name] Licensed Clinical Social Worker [facility] Care Management [phone]

## 2020-08-07 NOTE — Chronic Care Management (AMB) (Signed)
Chronic Care Management    Clinical Social Work Follow Up Note  08/07/2020 Name: [name] MRN: [id number] DOB: [birth year]  [name] is a 65 y.o. year old female who is a primary care patient of [name], [place]. The CCM team was consulted for assistance with [employer] .   Review of patient status, including review of consultants reports, other relevant assessments, and collaboration with appropriate care team members and the patient's provider was performed as part of comprehensive patient evaluation and provision of chronic care management services.    SDOH (Social Determinants of Health) assessments performed: No; risk for depression; risk for tobacco use; risk for stress; risk for physical inactivity  [place] Visit from 06/27/2020 in [name]  PHQ-9 Total Score 19     GAD 7 : Generalized Anxiety Score 06/27/2020 03/26/2020 12/19/2019 09/05/2019  Nervous, Anxious, on Edge 1 0 3 1  Control/stop worrying _0 Worry too much - different things _1 Trouble relaxing 1 0 3 0  Restless 0 0 3 0  Easily annoyed or irritable 1 0 3 0  Afraid - awful might happen 0 0 2 0  Total GAD 7 Score _2 Anxiety Difficulty Somewhat difficult Not difficult at all Very difficult Not difficult at all    Outpatient Encounter Medications as of 08/07/2020  Medication Sig  . albuterol (PROVENTIL HFA;VENTOLIN HFA) 108 (90 Base) MCG/ACT inhaler Inhale 2 puffs into the lungs every 6 (six) hours as needed for wheezing or shortness of breath.  . Alcohol Swabs (B-D SINGLE USE SWABS REGULAR) PADS check blood sugars twice daily Dx E11.9  . ALPRAZolam (XANAX) 1 MG tablet Take 1 tablet (1 mg total) by mouth 3 (three) times daily as needed for anxiety.  [name] amLODipine (NORVASC) 10 MG tablet Take 1 tablet (10 mg total) by mouth daily.  [name] aspirin 325 MG tablet Take 325 mg by mouth daily.    [name] atorvastatin (LIPITOR) 80 MG tablet Take 1 tablet  (80 mg total) by mouth daily.  . Blood Glucose Calibration (TRUE METRIX LEVEL 1) Low SOLN Use with glucose monitor Dx E11.9  . Blood Glucose Monitoring Suppl (TRUE METRIX AIR GLUCOSE METER) w/Device KIT check blood sugars twice daily Dx E11.9  . Cholecalciferol (VITAMIN D3) 5000 units TABS Take 1 tablet by mouth daily.  [name] escitalopram (LEXAPRO) 20 MG tablet Take 1 tablet (20 mg total) by mouth daily.  . fish oil-omega-3 fatty acids 1000 MG capsule Take 2 g by mouth 2 (two) times daily.   . fluticasone furoate-vilanterol (BREO ELLIPTA) 200-25 MCG/INH AEPB Inhale 1 puff into the lungs daily.  . furosemide (LASIX) 20 MG tablet TAKE 1 TABLET EVERY DAY AS NEEDED  . glucose blood (TRUE METRIX BLOOD GLUCOSE TEST) test strip Use to check blood sugars twice daily Dx E11.9  . hydrOXYzine (VISTARIL) 25 MG capsule Take 1 capsule by mouth three times daily as needed  . isosorbide mononitrate (IMDUR) 30 MG 24 hr tablet Take 0.5 tablets (15 mg total) by mouth daily.  [name] lisinopril (ZESTRIL) 40 MG tablet Take 1 tablet (40 mg total) by mouth daily.  . metFORMIN (GLUCOPHAGE) 500 MG tablet Take 1 tablet (500 mg total) by mouth 2 (two) times daily with a meal.  . metoprolol tartrate (LOPRESSOR) 25 MG tablet TAKE 1 TABLET TWICE DAILY  . pramipexole (MIRAPEX) 1 MG tablet Take 1 tablet (1 mg total) by mouth 3 (  three) times daily.  . triamcinolone cream (KENALOG) 0.1 % Apply 1 application topically 2 (two) times daily.  . TRUEplus Lancets 33G MISC Use to check blood sugars twice daily Dx E11.9  . vitamin B-12 (CYANOCOBALAMIN) 1000 MCG tablet Take 1,000 mcg by mouth daily.   No facility-administered encounter medications on file as of 08/07/2020.    [name] called client home phone number today several times but [name] was not able to speak via phone with client. Also, phone answering machine was not set up for client and thus [name] could not leave a phone message today for client.    Follow Up Plan:  [name] to call client in  next 4 weeks to assess the psychosocial needs of client at that time   S. MSW, [name] Licensed Clinical Social Worker [facility] Care [facility] [phone] 

## 2020-09-10 ENCOUNTER — Ambulatory Visit: Payer: Medicare HMO | Admitting: Licensed Clinical Social Worker

## 2020-09-10 DIAGNOSIS — F3342 Major depressive disorder, recurrent, in full remission: Secondary | ICD-10-CM

## 2020-09-10 DIAGNOSIS — E7800 Pure hypercholesterolemia, unspecified: Secondary | ICD-10-CM

## 2020-09-10 DIAGNOSIS — E1159 Type 2 diabetes mellitus with other circulatory complications: Secondary | ICD-10-CM

## 2020-09-10 DIAGNOSIS — F411 Generalized anxiety disorder: Secondary | ICD-10-CM

## 2020-09-10 DIAGNOSIS — I2510 Atherosclerotic heart disease of native coronary artery without angina pectoris: Secondary | ICD-10-CM

## 2020-09-10 DIAGNOSIS — J449 Chronic obstructive pulmonary disease, unspecified: Secondary | ICD-10-CM

## 2020-09-10 DIAGNOSIS — I10 Essential (primary) hypertension: Secondary | ICD-10-CM

## 2020-09-10 NOTE — Chronic Care Management (AMB) (Signed)
Chronic Care Management    Clinical Social Work Follow Up Note  09/10/2020 Name: [name] MRN: [id number] DOB: [birth year]  [name] is a 65 y.o. year old female who is a primary care patient of [name], [place]. The CCM team was consulted for assistance with [name] .   Review of patient status, including review of consultants reports, other relevant assessments, and collaboration with appropriate care team members and the patient's provider was performed as part of comprehensive patient evaluation and provision of chronic care management services.    SDOH (Social Determinants of Health) assessments performed: No; risk for tobacco use; risk for depression; risk for stress; risk for physical inactivity  [place] Visit from 06/27/2020 in [place]  PHQ-9 Total Score 19      GAD 7 : Generalized Anxiety Score 06/27/2020 03/26/2020 12/19/2019 09/05/2019  Nervous, Anxious, on Edge 1 0 3 1  Control/stop worrying 3 1 3 1   Worry too much - different things 3 1 3 1   Trouble relaxing 1 0 3 0  Restless 0 0 3 0  Easily annoyed or irritable 1 0 3 0  Afraid - awful might happen 0 0 2 0  Total GAD 7 Score 9 2 20 3   Anxiety Difficulty Somewhat difficult Not difficult at all Very difficult Not difficult at all    Outpatient Encounter Medications as of 09/10/2020  Medication Sig  . albuterol (PROVENTIL HFA;VENTOLIN HFA) 108 (90 Base) MCG/ACT inhaler Inhale 2 puffs into the lungs every 6 (six) hours as needed for wheezing or shortness of breath.  . Alcohol Swabs (B-D SINGLE USE SWABS REGULAR) PADS check blood sugars twice daily Dx E11.9  . ALPRAZolam (XANAX) 1 MG tablet Take 1 tablet (1 mg total) by mouth 3 (three) times daily as needed for anxiety.  [name] amLODipine (NORVASC) 10 MG tablet Take 1 tablet (10 mg total) by mouth daily.  [name] aspirin 325 MG tablet Take 325 mg by mouth daily.    [name] atorvastatin (LIPITOR) 80 MG tablet Take 1 tablet  (80 mg total) by mouth daily.  . Blood Glucose Calibration (TRUE METRIX LEVEL 1) Low SOLN Use with glucose monitor Dx E11.9  . Blood Glucose Monitoring Suppl (TRUE METRIX AIR GLUCOSE METER) w/Device KIT check blood sugars twice daily Dx E11.9  . Cholecalciferol (VITAMIN D3) 5000 units TABS Take 1 tablet by mouth daily.  [name] escitalopram (LEXAPRO) 20 MG tablet Take 1 tablet (20 mg total) by mouth daily.  . fish oil-omega-3 fatty acids 1000 MG capsule Take 2 g by mouth 2 (two) times daily.   . fluticasone furoate-vilanterol (BREO ELLIPTA) 200-25 MCG/INH AEPB Inhale 1 puff into the lungs daily.  . furosemide (LASIX) 20 MG tablet TAKE 1 TABLET EVERY DAY AS NEEDED  . glucose blood (TRUE METRIX BLOOD GLUCOSE TEST) test strip Use to check blood sugars twice daily Dx E11.9  . hydrOXYzine (VISTARIL) 25 MG capsule Take 1 capsule by mouth three times daily as needed  . isosorbide mononitrate (IMDUR) 30 MG 24 hr tablet Take 0.5 tablets (15 mg total) by mouth daily.  [name] lisinopril (ZESTRIL) 40 MG tablet Take 1 tablet (40 mg total) by mouth daily.  . metFORMIN (GLUCOPHAGE) 500 MG tablet Take 1 tablet (500 mg total) by mouth 2 (two) times daily with a meal.  . metoprolol tartrate (LOPRESSOR) 25 MG tablet TAKE 1 TABLET TWICE DAILY  . pramipexole (MIRAPEX) 1 MG tablet Take 1 tablet (1 mg total) by mouth  3 (three) times daily.  [name] triamcinolone cream (KENALOG) 0.1 % Apply 1 application topically 2 (two) times daily.  . TRUEplus Lancets 33G MISC Use to check blood sugars twice daily Dx E11.9  . vitamin B-12 (CYANOCOBALAMIN) 1000 MCG tablet Take 1,000 mcg by mouth daily.   No facility-administered encounter medications on file as of 09/10/2020.    [name] called client home phone number today but was unable to speak today via phone with client. Client phone message was not set up and thus [name] was not able to leave phone message for client. [name] also called [name], daughter of client today; but, [name] was not able to  speak with Lilia Pro via phone today. Phone message for [name] was not set up and thus [name] was not able to leave phone message today for [employer].  Follow Up Plan:[name] to call client/daughter in next 4 weeks to assess the psychosocial needs of client at that time  [name].Orissa Arreaga MSW, [name] Licensed Clinical Social Worker [facility] Care Management [phone]

## 2020-09-10 NOTE — Patient Instructions (Addendum)
Licensed Clinical Social Worker Visit Information  Materials Provided: No  09/10/2020  Name: CLOMA RAHRIG            MRN: [id number]       DOB: 1955/10/21  PAISLEI DORVAL is a 65 y.o. year old female who is a primary care patient of [name], [place]. The CCM team was consulted for assistance with [name] .   Review of patient status, including review of consultants reports, other relevant assessments, and collaboration with appropriate care team members and the patient's provider was performed as part of comprehensive patient evaluation and provision of chronic care management services.    SDOH (Social Determinants of Health) assessments performed: No; risk for tobacco use; risk for depression; risk for stress; risk for physical inactivity  [name] called client home phone number today but was unable to speak today via phone with client. Client phone message was not set up and thus [name] was not able to leave phone message for client. [name] also called [name], daughter of client today; but, [name] was not able to speak with Lilia Pro via phone today. Phone message for [name] was not set up and thus [name] was not able to leave phone message today for [employer].  Follow Up Plan:[name] to call client/daughterin next 4 weeks to assess the psychosocial needs of client at that time  [name] was not able to speak via phone today with client or daughter of client; thus , the client  or her daughter were not able to verbalize understanding of instructions provided today and were not able to accept or decline a print copy of patient instruction materials.   [name].Woody Kronberg MSW, [name] Licensed Clinical Social Worker [facility] Care Management [phone]

## 2020-09-16 ENCOUNTER — Telehealth: Payer: Self-pay | Admitting: Cardiovascular Disease

## 2020-09-16 NOTE — Telephone Encounter (Signed)
New message:       Patient calling to get a np apt with the doctor, he has nothing. Please advise.

## 2020-09-16 NOTE — Telephone Encounter (Signed)
Due to provider access, scheduled the patient with Dr. Johney Frame to reestablish cardiologists on 09/23/2020.  The patient was grateful for call and agrees with plan.

## 2020-09-22 NOTE — H&P (View-Only) (Signed)
Cardiology Office Note:    Date:  09/24/2020   ID:  [name], DOB [birth year], MRN [id number]  PCP:  [name], [place]  Cardiologist:  No primary care provider on file.  Advanced Practice Provider:  No care team member to display Electrophysiologist:  None   Referring MD: Hassell Done, [name], *    History of Present Illness:    [name] is a 65 y.o. female with a hx of anxiety, breast cancer, CAD with NSTEMI in 2008, HTN, HLD, DMII, bilateral carotid stenosis, COPD and depression who was referred by [name] for further management of CAD.   Last saw Dr. [name] in 06/2011. Patient underwent coronary stenting in 2000 a with a bare-metal stent in the right coronary artery. The patient had a preoperative stress test in 2012 for a lumpectomy. This demonstrated inferolateral ischemia and she was referred for cardiac catheterization, which showed total occlusion of her right coronary artery with left to right collaterals. There was also notation of chronic occlusion of the left circumflex which was present on her previous study in 2008. She had mild-to-moderate disease in the LAD. At her visit in 2012, she had mild anginal symptoms and was managed medically.  Patient states that she is feeling more fatigued with decreased exercise capacity recently. Also notes worsening dyspnea on exertion over the past 19month. Has mild chest discomfort at that time across the center of the chest. Symptoms resolve with rest and symptoms do not occur with rest. Patient states symptoms are similar to which she experienced when her stent was placed in the past. Has mild pedal edema for which she takes lasix as needed. States she has to sleep upright due to shortness of breath (has been ongoing for a couple of years). No PND. No nausea or vomiting.    Past Medical History:  Diagnosis Date  . Anxiety   . Breast cancer ([place] 06/30/11   inv ductal, ER/PR  +, her-2 -  . CAD (coronary artery disease)    NSTEMI 4/08: OM3 occluded (PCI unsuccessful), dRCA 95% => BMS, inf HK, EF 50%;   b. [place] 11/12:  [place] ischemia => [place]  dLAD 70%, OM1 70-80%, then occluded (no change from 2008), dOM filled L->L collats, mRCA stent occluded, dRCA filled L->R collats, EF 55-65% => med Rx (consider PCI of RCA if refractory angina)  . Carotid stenosis    dopplers 5/08: 0-30% bilateral; 10/12: 0-39% B/L ICA => f/u 04/2013  . Chronic kidney disease   . COPD (chronic obstructive pulmonary disease) ([place]   . Depression   . Depression with anxiety   . GERD (gastroesophageal reflux disease)    OTC acid reducer prn  . Headache(784.0)    sinus; occ. migraines  . Hx of radiation therapy 08/24/11 to 10/07/11   L breast  . Hyperlipidemia   . Hypertension    under control; has been on med. x 4 yrs.  . [place] infarction ([place]   . Stable angina ([place]    a. med Rx after cath 05/2011  . Stress incontinence, female     Past Surgical History:  Procedure Laterality Date  . AXILLARY LYMPH NODE DISSECTION  07/31/2011   Procedure: AXILLARY LYMPH NODE DISSECTION;  Surgeon: HAdin Hector MD;  [place] [place]  Service: General;  Laterality: Left;  left axillary sentinal node biopsy  . BREAST SURGERY    . CARDIAC CATHETERIZATION  11/02/2006; 06/03/2011  .  CESAREAN SECTION  1987  . CORONARY STENT PLACEMENT  11/02/2006  . ELBOW SURGERY     right  . MASTECTOMY PARTIAL / LUMPECTOMY  06/30/2011   left    Current Medications: Current Meds  Medication Sig  . albuterol (PROVENTIL HFA;VENTOLIN HFA) 108 (90 Base) MCG/ACT inhaler Inhale 2 puffs into the lungs every 6 (six) hours as needed for wheezing or shortness of breath.  . Alcohol Swabs (B-D SINGLE USE SWABS REGULAR) PADS check blood sugars twice daily Dx E11.9  . ALPRAZolam (XANAX) 1 MG tablet Take 1 tablet (1 mg total) by mouth 3 (three) times daily as needed for anxiety.  [name] amLODipine (NORVASC) 10 MG  tablet Take 1 tablet (10 mg total) by mouth daily.  [name] aspirin 325 MG tablet Take 325 mg by mouth daily.  [name] atorvastatin (LIPITOR) 80 MG tablet Take 1 tablet (80 mg total) by mouth daily.  . Blood Glucose Calibration (TRUE METRIX LEVEL 1) Low SOLN Use with glucose monitor Dx E11.9  . Blood Glucose Monitoring Suppl (TRUE METRIX AIR GLUCOSE METER) w/Device KIT check blood sugars twice daily Dx E11.9  . Cholecalciferol (VITAMIN D3) 5000 units TABS Take 1 tablet by mouth daily.  [name] escitalopram (LEXAPRO) 20 MG tablet Take 1 tablet (20 mg total) by mouth daily.  . fish oil-omega-3 fatty acids 1000 MG capsule Take 2 g by mouth 2 (two) times daily.  . fluticasone furoate-vilanterol (BREO ELLIPTA) 200-25 MCG/INH AEPB Inhale 1 puff into the lungs daily.  . furosemide (LASIX) 20 MG tablet TAKE 1 TABLET EVERY DAY AS NEEDED  . glucose blood (TRUE METRIX BLOOD GLUCOSE TEST) test strip Use to check blood sugars twice daily Dx E11.9  . hydrOXYzine (VISTARIL) 25 MG capsule Take 1 capsule by mouth three times daily as needed  . isosorbide mononitrate (IMDUR) 30 MG 24 hr tablet Take 0.5 tablets (15 mg total) by mouth daily.  [name] lisinopril (ZESTRIL) 40 MG tablet Take 1 tablet (40 mg total) by mouth daily.  . metFORMIN (GLUCOPHAGE) 500 MG tablet Take 1 tablet (500 mg total) by mouth 2 (two) times daily with a meal.  . metoprolol tartrate (LOPRESSOR) 25 MG tablet TAKE 1 TABLET TWICE DAILY  . pramipexole (MIRAPEX) 1 MG tablet Take 1 mg by mouth 2 (two) times daily as needed.  . triamcinolone cream (KENALOG) 0.1 % Apply 1 application topically 2 (two) times daily.  . TRUEplus Lancets 33G MISC Use to check blood sugars twice daily Dx E11.9  . vitamin B-12 (CYANOCOBALAMIN) 1000 MCG tablet Take 1,000 mcg by mouth daily.  . [DISCONTINUED] pramipexole (MIRAPEX) 1 MG tablet Take 1 tablet (1 mg total) by mouth 3 (three) times daily. (Patient taking differently: Take 1 mg by mouth 2 (two) times daily as needed.)     Allergies:    Food, Nutmeg oil (myristica oil), and Other   Social History   Socioeconomic History  . Marital status: Single    Spouse name: Not on file  . Number of children: 1  . Years of education: 12+  . Highest education level: Some college, no degree  Occupational History  . Not on file  Tobacco Use  . Smoking status: Current Every Day Smoker    Packs/day: 1.00    Years: 45.00    Pack years: 45.00    Types: Cigarettes  . Smokeless tobacco: Never Used  . Tobacco comment: on and off  Vaping Use  . Vaping Use: Never used  Substance and Sexual Activity  . Alcohol  use: Yes    Alcohol/week: 1.0 standard drink    Types: 1 Glasses of wine per week    Comment: occasionally  . Drug use: No  . Sexual activity: Never    Birth control/protection: Post-menopausal  Other Topics Concern  . Not on file  Social History Narrative   Lives at home alone. She has a daughter and grandchildren that she interacts with often.    Social Determinants of Health   Financial Resource Strain: Not on file  Food Insecurity: Not on file  Transportation Needs: Not on file  Physical Activity: Not on file  Stress: Not on file  Social Connections: Not on file     Family History: The patient's family history includes Anxiety disorder in her daughter; Bipolar disorder in her sister; Cancer in her maternal aunt and maternal grandmother; Depression in her daughter; Heart attack in her sister; Heart disease in her father and mother; Thyroid disease in her daughter.  ROS:   Please see the history of present illness.    Review of Systems  Constitutional: Positive for malaise/fatigue. Negative for chills and fever.  HENT: Negative for hearing loss.   Eyes: Negative for blurred vision and redness.  Respiratory: Positive for shortness of breath.   Cardiovascular: Positive for chest pain, orthopnea and leg swelling. Negative for palpitations, claudication and PND.  Gastrointestinal: Negative for melena, nausea and  vomiting.  Genitourinary: Negative for hematuria.  Musculoskeletal: Positive for joint pain and myalgias. Negative for falls.  Neurological: Negative for dizziness and loss of consciousness.  Endo/Heme/Allergies: Negative for polydipsia.  Psychiatric/Behavioral: Positive for depression.    EKGs/Labs/Other Studies Reviewed:    The following studies were reviewed today: Carotid doppler 08/25/18 IMPRESSION: Minor carotid atherosclerosis. No hemodynamically significant ICA stenosis. Degree of narrowing less than 50% bilaterally by ultrasound criteria.  Patent antegrade vertebral flow bilaterally  Cath 2008: NSTEMI 10/2006: pLAD 30%, pCFX 40%, OM3 occluded (PCI unsuccessful), mRCA 50%, dRCA 95% treated with BMS, inf HK, EF 50%.  Cath 2012: FINDINGS:  Aortic pressure is 164/80 with a mean of 116, left  ventricular pressure 165/6 with an end-diastolic pressure of 22.  There  is no aortic stenosis.   The left mainstem is angiographically normal.  It trifurcates into the  LAD, left circumflex and ramus intermedius.  The LAD has mild  nonobstructive luminal irregularity in its proximal portion of no  greater than 30%.  The LAD gives off a large first diagonal branch.  The  mid and distal LAD have minor luminal irregularities, but no significant  angiographic disease.  The LAD also gives off a medium-size second  diagonal branch.   The left circumflex system is of medium caliber.  The proximal portion  of the left circumflex has a 40% stenosis.  The left circumflex gives  off a small intermediate branch in a second OM territory and there is a  third OM that is subtotally occluded and fills late likely from  antegrade flow.  The true AV groove circumflex has diffuse  nonobstructive disease.   The right coronary artery has a high anterior origin.  The midportion of  the vessel has a 50% stenosis and the distal portion of the vessel has a  95% irregular stenosis.  It gives off a large  PDA branch and a small  posterolateral branch.  There is TIMI III flow throughout the vessel.   Left ventriculography performed both in the RAO and LAO projection  demonstrates basal to mid inferior segment of severe  hypokinesis.  Other  LV segments are normal.  The left ventricular ejection fraction is 50%.  There is no mitral regurgitation.   ASSESSMENT: 1. Two-vessel coronary artery disease with total occlusion of a small      branch of the left circumflex and high-grade focal stenosis of the      right coronary artery.  2. Mild segmental left ventricular dysfunction.   PLAN:  As detailed above, PCI of the left circumflex branch was  attempted, but I was unable to cross the lesion with a wire.  The right  coronary artery which is likely the patient's culprit vessel was  intervened upon as described with one bare metal stent.  The patient  should continue on aspirin and clopidogrel for 30 days.  Will also place  her on a high-dose statin.  Will consult financial services or the case  manager to help with obtaining medications.   EKG:  EKG is  ordered today.  The ekg ordered today demonstrates NSR with HR 62  Recent Labs: 06/27/2020: ALT 11; BUN 12; Creatinine, Ser 0.85; Hemoglobin 13.1; Platelets 279; Potassium 4.8; Sodium 146  Recent Lipid Panel    Component Value Date/Time   CHOL 152 06/27/2020 1512   CHOL 169 01/18/2013 1052   TRIG 158 (H) 06/27/2020 1512   TRIG 230 (H) 01/18/2013 1052   HDL 38 (L) 06/27/2020 1512   HDL 34 (L) 01/18/2013 1052   CHOLHDL 4.0 06/27/2020 1512   CHOLHDL 5.9 CALC 05/16/2007 0854   VLDL 47 (H) 05/16/2007 0854   LDLCALC 86 06/27/2020 1512   LDLCALC 89 01/18/2013 1052   LDLDIRECT 100.0 05/16/2007 0854     Physical Exam:    VS:  BP 126/76   Pulse 62   Ht _0  (1.6 m)   Wt 213 lb (96.6 kg)   LMP 10/17/2005   SpO2 92%   BMI 37.73 kg/m     Wt Readings from Last 3 Encounters:  09/24/20 213 lb (96.6 kg)  06/27/20 206 lb (93.4 kg)   05/16/20 201 lb (91.2 kg)     GEN: Comfortable, NAD HEENT: Normal NECK: No JVD; No carotid bruits CARDIAC: RRR, no murmurs, rubs, gallops RESPIRATORY:  Diminished but clear ABDOMEN: Soft, non-tender, non-distended MUSCULOSKELETAL:  1+ pedal edema bilaterally SKIN: Warm and dry NEUROLOGIC:  Alert and oriented x 3 PSYCHIATRIC:  Normal affect   ASSESSMENT:    1. Coronary artery disease of native artery of native heart with stable angina pectoris (HCC)   2. Chest pain of uncertain etiology   3. Primary hypertension   4. Mixed hyperlipidemia   5. Type 2 diabetes mellitus with other circulatory complication, without long-term current use of insulin ([place])   6. Chronic obstructive pulmonary disease, unspecified COPD type ([place])    PLAN:    In order of problems listed above:  #Multivessel CAD: #Concern for Progressive Angina S/p BMS in 2012 to RCA, known occlusion of OM, mild-to-moderate LAD disease. Now with recurrent chest pain and shortness of breath on exertion which has progressed over the past several months. Symptoms similar to those she experienced in the past prior to her stent placement. Given known multivessel CAD and symptoms, there is concern for progressive angina and will proceed with coronary angiography.  -Plan for coronary angiography with Dr. [name] (has seen Dr. [name] in the past) -Continue ASA 369m daily -Continue atorvastatin 859mdaily -Continue imdur 3044maily -Continue lisinopril 69m18mily -Change metop to XL 25mg51mly  #HTN: Well controlled -Continue  amlodipine 73m daily -Continue metop and imdur as above  #HLD: -Continue atorva 854mdaily  #DMII: -Continue metformin  #COPD: -Continue home inhalers -Management per primary  Shared Decision Making/Informed Consent The risks [stroke (1 in 1000), death (1 in 1000), kidney failure [usually temporary] (1 in 500), bleeding (1 in 200), allergic reaction [possibly serious] (1 in 200)], benefits  (diagnostic support and management of coronary artery disease) and alternatives of a cardiac catheterization were discussed in detail with Ms. GrVandervoortnd she is willing to proceed.   Medication Adjustments/Labs and Tests Ordered: Current medicines are reviewed at length with the patient today.  Concerns regarding medicines are outlined above.  Orders Placed This Encounter  Procedures  . Basic metabolic panel  . CBC  . EKG 12-Lead   No orders of the defined types were placed in this encounter.   Patient Instructions  Medication Instructions:  Your physician recommends that you continue on your current medications as directed. Please refer to the Current Medication list given to you today.   *If you need a refill on your cardiac medications before your next appointment, please call your pharmacy*   Lab Work: Your physician recommends that you return for lab work in on 10/04/2020. (Our lab is open from 7:30 AM to 4:45 PM)  Due to recent COVID-19 restrictions implemented by our local and state authorities and in an effort to keep both patients and staff as safe as possible, our hospital system requires COVID-19 testing prior to certain scheduled hospital procedures.  [id number] 10/04/2020 at  10:45 AM .  This is a drive up testing site.  You will not need to exit your vehicle.  You will not be billed at the time of testing but may receive a bill later depending on your insurance.  The approximate cost of the test is $100.  You must agree to self-quarantine from the time of your testing until the procedure date on 10/07/2020.  This should included staying home with ONLY the people you live with.  Avoid take-out, grocery store shopping or leaving the house for any non-emergent reason.  Failure to have your COVID-19 test done on the date and time you have been scheduled will result in cancellation of your procedure.  Please call our office at  [phone] you have any questions.   If you have labs (blood work) drawn today and your tests are completely normal, you will receive your results only by: . [name] KitchenyChart Message (if you have MyChart) OR . A paper copy in the mail If you have any lab test that is abnormal or we need to change your treatment, we will call you to review the results.   Testing/Procedures: Your physician has requested that you have a cardiac catheterization. Cardiac catheterization is used to diagnose and/or treat various heart conditions. Doctors may recommend this procedure for a number of different reasons. The most common reason is to evaluate chest pain. Chest pain can be a symptom of coronary artery disease (CAD), and cardiac catheterization can show whether plaque is narrowing or blocking your heart's arteries. This procedure is also used to evaluate the valves, as well as measure the blood flow and oxygen levels in different parts of your heart. For further information please visit [email or web address] follow instruction sheet, as given.    Follow-Up: Follow up with Dr. PeJohney Frames planned   Other Instructions    [facility] [place], [place] [address] Dept: [phone] Loc: [place]  09/24/2020  You are scheduled for a Cardiac Catheterization on Monday, March 14 with [name].  1. Please arrive at the [facility] (Main Entrance A) at [facility]: [address] [place], [place] [address] at 8:30 AM (This time is two hours before your procedure to ensure your preparation). Free valet parking service is available.   Special note: Every effort is made to have your procedure done on time. Please understand that emergencies sometimes delay scheduled procedures.  2. Diet: Do not eat solid foods after midnight.  The patient may have clear liquids until  5am upon the day of the procedure.  3. Medication instructions in preparation for your procedure:   Hold Lasix the morning of your procedure   Do not take Diabetes Med Glucophage (Metformin) on the day of the procedure and HOLD 48 HOURS AFTER THE PROCEDURE.  On the morning of your procedure, take your Aspirin and any morning medicines NOT listed above.  You may use sips of water.  5. Plan for one night stay--bring personal belongings. 6. Bring a current list of your medications and current insurance cards. 7. You MUST have a responsible person to drive you home. 8. Someone MUST be with you the first 24 hours after you arrive home or your discharge will be delayed. 9. Please wear clothes that are easy to get on and off and wear slip-on shoes.  Thank you for allowing [place] to care for you!   -- [facility]      Signed, [name], MD  09/24/2020 3:46 PM    [place]

## 2020-09-22 NOTE — Progress Notes (Signed)
Cardiology Office Note:    Date:  09/24/2020   ID:  [name], DOB [birth year], MRN [id number]  PCP:  Chevis Reid, Tracy  Cardiologist:  No primary care provider on file.  Advanced Practice Provider:  No care team member to display Electrophysiologist:  None   Referring MD: Hassell Reid, Tracy, *    History of Present Illness:    [name] is a 65 y.o. female with a hx of anxiety, breast cancer, CAD with NSTEMI in 2008, HTN, HLD, DMII, bilateral carotid stenosis, COPD and depression who was referred by Ronnald Reid for further management of CAD.   Last saw Dr. Burt Reid in 06/2011. Patient underwent coronary stenting in 2000 a with a bare-metal stent in the right coronary artery. The patient had a preoperative stress test in 2012 for a lumpectomy. This demonstrated inferolateral ischemia and she was referred for cardiac catheterization, which showed total occlusion of her right coronary artery with left to right collaterals. There was also notation of chronic occlusion of the left circumflex which was present on her previous study in 2008. She had mild-to-moderate disease in the LAD. At her visit in 2012, she had mild anginal symptoms and was managed medically.  Patient states that she is feeling more fatigued with decreased exercise capacity recently. Also notes worsening dyspnea on exertion over the past 19month. Has mild chest discomfort at that time across the center of the chest. Symptoms resolve with rest and symptoms do not occur with rest. Patient states symptoms are similar to which she experienced when her stent was placed in the past. Has mild pedal edema for which she takes lasix as needed. States she has to sleep upright due to shortness of breath (has been ongoing for a couple of years). No PND. No nausea or vomiting.    Past Medical History:  Diagnosis Date  . Anxiety   . Breast cancer ([place] 06/30/11   inv ductal, ER/PR  +, her-2 -  . CAD (coronary artery disease)    NSTEMI 4/08: OM3 occluded (PCI unsuccessful), dRCA 95% => BMS, inf HK, EF 50%;   b. [place] 11/12:  [place] ischemia => [place]:  dLAD 70%, OM1 70-80%, then occluded (no change from 2008), dOM filled L->L collats, mRCA stent occluded, dRCA filled L->R collats, EF 55-65% => med Rx (consider PCI of RCA if refractory angina)  . Carotid stenosis    dopplers 5/08: 0-30% bilateral; 10/12: 0-39% B/L ICA => f/u 04/2013  . Chronic kidney disease   . COPD (chronic obstructive pulmonary disease) ([place]   . Depression   . Depression with anxiety   . GERD (gastroesophageal reflux disease)    OTC acid reducer prn  . Headache(784.0)    sinus; occ. migraines  . Hx of radiation therapy 08/24/11 to 10/07/11   L breast  . Hyperlipidemia   . Hypertension    under control; has been on med. x 4 yrs.  . [place] infarction ([place]   . Stable angina ([place]    a. med Rx after cath 05/2011  . Stress incontinence, female     Past Surgical History:  Procedure Laterality Date  . AXILLARY LYMPH NODE DISSECTION  07/31/2011   Procedure: AXILLARY LYMPH NODE DISSECTION;  Surgeon: HAdin Hector MD;  [place]  Service: General;  Laterality: Left;  left axillary sentinal node biopsy  . BREAST SURGERY    . CARDIAC CATHETERIZATION  11/02/2006; 06/03/2011  .  CESAREAN SECTION  1987  . CORONARY STENT PLACEMENT  11/02/2006  . ELBOW SURGERY     right  . MASTECTOMY PARTIAL / LUMPECTOMY  06/30/2011   left    Current Medications: Current Meds  Medication Sig  . albuterol (PROVENTIL HFA;VENTOLIN HFA) 108 (90 Base) MCG/ACT inhaler Inhale 2 puffs into the lungs every 6 (six) hours as needed for wheezing or shortness of breath.  . Alcohol Swabs (B-D SINGLE USE SWABS REGULAR) PADS check blood sugars twice daily Dx E11.9  . ALPRAZolam (XANAX) 1 MG tablet Take 1 tablet (1 mg total) by mouth 3 (three) times daily as needed for anxiety.  [name] amLODipine (NORVASC) 10 MG  tablet Take 1 tablet (10 mg total) by mouth daily.  [name] aspirin 325 MG tablet Take 325 mg by mouth daily.  [name] atorvastatin (LIPITOR) 80 MG tablet Take 1 tablet (80 mg total) by mouth daily.  . Blood Glucose Calibration (TRUE METRIX LEVEL 1) Low SOLN Use with glucose monitor Dx E11.9  . Blood Glucose Monitoring Suppl (TRUE METRIX AIR GLUCOSE METER) w/Device KIT check blood sugars twice daily Dx E11.9  . Cholecalciferol (VITAMIN D3) 5000 units TABS Take 1 tablet by mouth daily.  [name] escitalopram (LEXAPRO) 20 MG tablet Take 1 tablet (20 mg total) by mouth daily.  . fish oil-omega-3 fatty acids 1000 MG capsule Take 2 g by mouth 2 (two) times daily.  . fluticasone furoate-vilanterol (BREO ELLIPTA) 200-25 MCG/INH AEPB Inhale 1 puff into the lungs daily.  . furosemide (LASIX) 20 MG tablet TAKE 1 TABLET EVERY DAY AS NEEDED  . glucose blood (TRUE METRIX BLOOD GLUCOSE TEST) test strip Use to check blood sugars twice daily Dx E11.9  . hydrOXYzine (VISTARIL) 25 MG capsule Take 1 capsule by mouth three times daily as needed  . isosorbide mononitrate (IMDUR) 30 MG 24 hr tablet Take 0.5 tablets (15 mg total) by mouth daily.  [name] lisinopril (ZESTRIL) 40 MG tablet Take 1 tablet (40 mg total) by mouth daily.  . metFORMIN (GLUCOPHAGE) 500 MG tablet Take 1 tablet (500 mg total) by mouth 2 (two) times daily with a meal.  . metoprolol tartrate (LOPRESSOR) 25 MG tablet TAKE 1 TABLET TWICE DAILY  . pramipexole (MIRAPEX) 1 MG tablet Take 1 mg by mouth 2 (two) times daily as needed.  . triamcinolone cream (KENALOG) 0.1 % Apply 1 application topically 2 (two) times daily.  . TRUEplus Lancets 33G MISC Use to check blood sugars twice daily Dx E11.9  . vitamin B-12 (CYANOCOBALAMIN) 1000 MCG tablet Take 1,000 mcg by mouth daily.  . [DISCONTINUED] pramipexole (MIRAPEX) 1 MG tablet Take 1 tablet (1 mg total) by mouth 3 (three) times daily. (Patient taking differently: Take 1 mg by mouth 2 (two) times daily as needed.)     Allergies:    Food, Nutmeg oil (myristica oil), and Other   Social History   Socioeconomic History  . Marital status: Single    Spouse name: Not on file  . Number of children: 1  . Years of education: 12+  . Highest education level: Some college, no degree  Occupational History  . Not on file  Tobacco Use  . Smoking status: Current Every Day Smoker    Packs/day: 1.00    Years: 45.00    Pack years: 45.00    Types: Cigarettes  . Smokeless tobacco: Never Used  . Tobacco comment: on and off  Vaping Use  . Vaping Use: Never used  Substance and Sexual Activity  . Alcohol  use: Yes    Alcohol/week: 1.0 standard drink    Types: 1 Glasses of wine per week    Comment: occasionally  . Drug use: No  . Sexual activity: Never    Birth control/protection: Post-menopausal  Other Topics Concern  . Not on file  Social History Narrative   Lives at home alone. She has a daughter and grandchildren that she interacts with often.    Social Determinants of Health   Financial Resource Strain: Not on file  Food Insecurity: Not on file  Transportation Needs: Not on file  Physical Activity: Not on file  Stress: Not on file  Social Connections: Not on file     Family History: The patient's family history includes Anxiety disorder in her daughter; Bipolar disorder in her sister; Cancer in her maternal aunt and maternal grandmother; Depression in her daughter; Heart attack in her sister; Heart disease in her father and mother; Thyroid disease in her daughter.  ROS:   Please see the history of present illness.    Review of Systems  Constitutional: Positive for malaise/fatigue. Negative for chills and fever.  HENT: Negative for hearing loss.   Eyes: Negative for blurred vision and redness.  Respiratory: Positive for shortness of breath.   Cardiovascular: Positive for chest pain, orthopnea and leg swelling. Negative for palpitations, claudication and PND.  Gastrointestinal: Negative for melena, nausea and  vomiting.  Genitourinary: Negative for hematuria.  Musculoskeletal: Positive for joint pain and myalgias. Negative for falls.  Neurological: Negative for dizziness and loss of consciousness.  Endo/Heme/Allergies: Negative for polydipsia.  Psychiatric/Behavioral: Positive for depression.    EKGs/Labs/Other Studies Reviewed:    The following studies were reviewed today: Carotid doppler 08/25/18 IMPRESSION: Minor carotid atherosclerosis. No hemodynamically significant ICA stenosis. Degree of narrowing less than 50% bilaterally by ultrasound criteria.  Patent antegrade vertebral flow bilaterally  Cath 2008: NSTEMI 10/2006: pLAD 30%, pCFX 40%, OM3 occluded (PCI unsuccessful), mRCA 50%, dRCA 95% treated with BMS, inf HK, EF 50%.  Cath 2012: FINDINGS:  Aortic pressure is 164/80 with a mean of 116, left  ventricular pressure 165/6 with an end-diastolic pressure of 22.  There  is no aortic stenosis.   The left mainstem is angiographically normal.  It trifurcates into the  LAD, left circumflex and ramus intermedius.  The LAD has mild  nonobstructive luminal irregularity in its proximal portion of no  greater than 30%.  The LAD gives off a large first diagonal branch.  The  mid and distal LAD have minor luminal irregularities, but no significant  angiographic disease.  The LAD also gives off a medium-size second  diagonal branch.   The left circumflex system is of medium caliber.  The proximal portion  of the left circumflex has a 40% stenosis.  The left circumflex gives  off a small intermediate branch in a second OM territory and there is a  third OM that is subtotally occluded and fills late likely from  antegrade flow.  The true AV groove circumflex has diffuse  nonobstructive disease.   The right coronary artery has a high anterior origin.  The midportion of  the vessel has a 50% stenosis and the distal portion of the vessel has a  95% irregular stenosis.  It gives off a large  PDA branch and a small  posterolateral branch.  There is TIMI III flow throughout the vessel.   Left ventriculography performed both in the RAO and LAO projection  demonstrates basal to mid inferior segment of severe  hypokinesis.  Other  LV segments are normal.  The left ventricular ejection fraction is 50%.  There is no mitral regurgitation.   ASSESSMENT: 1. Two-vessel coronary artery disease with total occlusion of a small      branch of the left circumflex and high-grade focal stenosis of the      right coronary artery.  2. Mild segmental left ventricular dysfunction.   PLAN:  As detailed above, PCI of the left circumflex branch was  attempted, but I was unable to cross the lesion with a wire.  The right  coronary artery which is likely the patient's culprit vessel was  intervened upon as described with one bare metal stent.  The patient  should continue on aspirin and clopidogrel for 30 days.  Will also place  her on a high-dose statin.  Will consult financial services or the case  manager to help with obtaining medications.   EKG:  EKG is  ordered today.  The ekg ordered today demonstrates NSR with HR 62  Recent Labs: 06/27/2020: ALT 11; BUN 12; Creatinine, Ser 0.85; Hemoglobin 13.1; Platelets 279; Potassium 4.8; Sodium 146  Recent Lipid Panel    Component Value Date/Time   CHOL 152 06/27/2020 1512   CHOL 169 01/18/2013 1052   TRIG 158 (H) 06/27/2020 1512   TRIG 230 (H) 01/18/2013 1052   HDL 38 (L) 06/27/2020 1512   HDL 34 (L) 01/18/2013 1052   CHOLHDL 4.0 06/27/2020 1512   CHOLHDL 5.9 CALC 05/16/2007 0854   VLDL 47 (H) 05/16/2007 0854   LDLCALC 86 06/27/2020 1512   LDLCALC 89 01/18/2013 1052   LDLDIRECT 100.0 05/16/2007 0854     Physical Exam:    VS:  BP 126/76   Pulse 62   Ht _0  (1.6 m)   Wt 213 lb (96.6 kg)   LMP 10/17/2005   SpO2 92%   BMI 37.73 kg/m     Wt Readings from Last 3 Encounters:  09/24/20 213 lb (96.6 kg)  06/27/20 206 lb (93.4 kg)   05/16/20 201 lb (91.2 kg)     GEN: Comfortable, NAD HEENT: Normal NECK: No JVD; No carotid bruits CARDIAC: RRR, no murmurs, rubs, gallops RESPIRATORY:  Diminished but clear ABDOMEN: Soft, non-tender, non-distended MUSCULOSKELETAL:  1+ pedal edema bilaterally SKIN: Warm and dry NEUROLOGIC:  Alert and oriented x 3 PSYCHIATRIC:  Normal affect   ASSESSMENT:    1. Coronary artery disease of native artery of native heart with stable angina pectoris (HCC)   2. Chest pain of uncertain etiology   3. Primary hypertension   4. Mixed hyperlipidemia   5. Type 2 diabetes mellitus with other circulatory complication, without long-term current use of insulin ([place])   6. Chronic obstructive pulmonary disease, unspecified COPD type ([place])    PLAN:    In order of problems listed above:  #Multivessel CAD: #Concern for Progressive Angina S/p BMS in 2012 to RCA, known occlusion of OM, mild-to-moderate LAD disease. Now with recurrent chest pain and shortness of breath on exertion which has progressed over the past several months. Symptoms similar to those she experienced in the past prior to her stent placement. Given known multivessel CAD and symptoms, there is concern for progressive angina and will proceed with coronary angiography.  -Plan for coronary angiography with Dr. Burt Reid (has seen Dr. Burt Reid in the past) -Continue ASA 369m daily -Continue atorvastatin 859mdaily -Continue imdur 3044maily -Continue lisinopril 69m18mily -Change metop to XL 25mg51mly  #HTN: Well controlled -Continue  amlodipine 73m daily -Continue metop and imdur as above  #HLD: -Continue atorva 854mdaily  #DMII: -Continue metformin  #COPD: -Continue home inhalers -Management per primary  Shared Decision Making/Informed Consent The risks [stroke (1 in 1000), death (1 in 1000), kidney failure [usually temporary] (1 in 500), bleeding (1 in 200), allergic reaction [possibly serious] (1 in 200)], benefits  (diagnostic support and management of coronary artery disease) and alternatives of a cardiac catheterization were discussed in detail with Ms. GrVandervoortnd she is willing to proceed.   Medication Adjustments/Labs and Tests Ordered: Current medicines are reviewed at length with the patient today.  Concerns regarding medicines are outlined above.  Orders Placed This Encounter  Procedures  . Basic metabolic panel  . CBC  . EKG 12-Lead   No orders of the defined types were placed in this encounter.   Patient Instructions  Medication Instructions:  Your physician recommends that you continue on your current medications as directed. Please refer to the Current Medication list given to you today.   *If you need a refill on your cardiac medications before your next appointment, please call your pharmacy*   Lab Work: Your physician recommends that you return for lab work in on 10/04/2020. (Our lab is open from 7:30 AM to 4:45 PM)  Due to recent COVID-19 restrictions implemented by our local and state authorities and in an effort to keep both patients and staff as safe as possible, our hospital system requires COVID-19 testing prior to certain scheduled hospital procedures.  [place] [id number] 10/04/2020 at  10:45 AM .  This is a drive up testing site.  You will not need to exit your vehicle.  You will not be billed at the time of testing but may receive a bill later depending on your insurance.  The approximate cost of the test is $100.  You must agree to self-quarantine from the time of your testing until the procedure date on 10/07/2020.  This should included staying home with ONLY the people you live with.  Avoid take-out, grocery store shopping or leaving the house for any non-emergent reason.  Failure to have your COVID-19 test Reid on the date and time you have been scheduled will result in cancellation of your procedure.  Please call our office at  [phone] you have any questions.   If you have labs (blood work) drawn today and your tests are completely normal, you will receive your results only by: . [name] KitchenyChart Message (if you have MyChart) OR . A paper copy in the mail If you have any lab test that is abnormal or we need to change your treatment, we will call you to review the results.   Testing/Procedures: Your physician has requested that you have a cardiac catheterization. Cardiac catheterization is used to diagnose and/or treat various heart conditions. Doctors may recommend this procedure for a number of different reasons. The most common reason is to evaluate chest pain. Chest pain can be a symptom of coronary artery disease (CAD), and cardiac catheterization can show whether plaque is narrowing or blocking your heart's arteries. This procedure is also used to evaluate the valves, as well as measure the blood flow and oxygen levels in different parts of your heart. For further information please visit [email or web address] follow instruction sheet, as given.    Follow-Up: Follow up with Dr. PeJohney Frames planned   Other Instructions    [facility] [place], [place]  [place] [address] Dept: [phone] Loc: [place]  09/24/2020  You are scheduled for a Cardiac Catheterization on Monday, March 14 with [name].  1. Please arrive at the [facility] (Main Entrance A) at [facility]: [address] [place], [place] [address] at 8:30 AM (This time is two hours before your procedure to ensure your preparation). Free valet parking service is available.   Special note: Every effort is made to have your procedure Reid on time. Please understand that emergencies sometimes delay scheduled procedures.  2. Diet: Do not eat solid foods after midnight.  The patient may have clear liquids until  5am upon the day of the procedure.  3. Medication instructions in preparation for your procedure:   Hold Lasix the morning of your procedure   Do not take Diabetes Med Glucophage (Metformin) on the day of the procedure and HOLD 48 HOURS AFTER THE PROCEDURE.  On the morning of your procedure, take your Aspirin and any morning medicines NOT listed above.  You may use sips of water.  5. Plan for one night stay--bring personal belongings. 6. Bring a current list of your medications and current insurance cards. 7. You MUST have a responsible person to drive you home. 8. Someone MUST be with you the first 24 hours after you arrive home or your discharge will be delayed. 9. Please wear clothes that are easy to get on and off and wear slip-on shoes.  Thank you for allowing [place] to care for you!   -- [place] [facility]      Signed, [name], MD  09/24/2020 3:46 PM    [place]

## 2020-09-23 ENCOUNTER — Ambulatory Visit: Payer: Medicare HMO | Admitting: Cardiology

## 2020-09-24 ENCOUNTER — Other Ambulatory Visit: Payer: Self-pay

## 2020-09-24 ENCOUNTER — Encounter: Payer: Self-pay | Admitting: Cardiology

## 2020-09-24 ENCOUNTER — Ambulatory Visit: Payer: Medicare HMO | Admitting: Cardiology

## 2020-09-24 VITALS — BP 126/76 | HR 62 | Ht 63.0 in | Wt 213.0 lb

## 2020-09-24 DIAGNOSIS — I10 Essential (primary) hypertension: Secondary | ICD-10-CM | POA: Diagnosis not present

## 2020-09-24 DIAGNOSIS — I25118 Atherosclerotic heart disease of native coronary artery with other forms of angina pectoris: Secondary | ICD-10-CM

## 2020-09-24 DIAGNOSIS — E1159 Type 2 diabetes mellitus with other circulatory complications: Secondary | ICD-10-CM

## 2020-09-24 DIAGNOSIS — R079 Chest pain, unspecified: Secondary | ICD-10-CM

## 2020-09-24 DIAGNOSIS — J449 Chronic obstructive pulmonary disease, unspecified: Secondary | ICD-10-CM

## 2020-09-24 DIAGNOSIS — E782 Mixed hyperlipidemia: Secondary | ICD-10-CM | POA: Diagnosis not present

## 2020-09-24 NOTE — Patient Instructions (Signed)
Medication Instructions:  Your physician recommends that you continue on your current medications as directed. Please refer to the Current Medication list given to you today.   *If you need a refill on your cardiac medications before your next appointment, please call your pharmacy*   Lab Work: Your physician recommends that you return for lab work in on 10/04/2020. (Our lab is open from 7:30 AM to 4:45 PM)  Due to recent COVID-19 restrictions implemented by our local and state authorities and in an effort to keep both patients and staff as safe as possible, our hospital system requires COVID-19 testing prior to certain scheduled hospital procedures.  Please go to [address]. [place], [place] [address] on 10/04/2020 at  10:45 AM .  This is a drive up testing site.  You will not need to exit your vehicle.  You will not be billed at the time of testing but may receive a bill later depending on your insurance.  The approximate cost of the test is $100.  You must agree to self-quarantine from the time of your testing until the procedure date on 10/07/2020.  This should included staying home with ONLY the people you live with.  Avoid take-out, grocery store shopping or leaving the house for any non-emergent reason.  Failure to have your COVID-19 test done on the date and time you have been scheduled will result in cancellation of your procedure.  Please call our office at [phone] if you have any questions.   If you have labs (blood work) drawn today and your tests are completely normal, you will receive your results only by: [name] MyChart Message (if you have MyChart) OR . A paper copy in the mail If you have any lab test that is abnormal or we need to change your treatment, we will call you to review the results.   Testing/Procedures: Your physician has requested that you have a cardiac catheterization. Cardiac catheterization is used to diagnose and/or treat various heart conditions. Doctors may  recommend this procedure for a number of different reasons. The most common reason is to evaluate chest pain. Chest pain can be a symptom of coronary artery disease (CAD), and cardiac catheterization can show whether plaque is narrowing or blocking your heart's arteries. This procedure is also used to evaluate the valves, as well as measure the blood flow and oxygen levels in different parts of your heart. For further information please visit [email or web address]. Please follow instruction sheet, as given.    Follow-Up: Follow up with Dr. Johney Frame as planned   Other [place] OFFICE [place], [place] [address] Dept: [phone] Loc: [place]  09/24/2020  You are scheduled for a Cardiac Catheterization on Monday, March 14 with [name].  1. Please arrive at the [facility] (Main Entrance A) at [facility]: [address] [place], [place] [address] at 8:30 AM (This time is two hours before your procedure to ensure your preparation). Free valet parking service is available.   Special note: Every effort is made to have your procedure done on time. Please understand that emergencies sometimes delay scheduled procedures.  2. Diet: Do not eat solid foods after midnight.  The patient may have clear liquids until 5am upon the day of the procedure.  3. Medication instructions in preparation for your procedure:   Hold Lasix the morning of your procedure  Do not take Diabetes Med Glucophage (Metformin) on the day of the procedure and HOLD 48 HOURS AFTER THE PROCEDURE.  On the morning of your procedure, take your Aspirin and any morning medicines NOT listed above.  You may use sips of water.  5. Plan for one night stay--bring personal belongings. 6. Bring a current list of your medications and current insurance cards. 7. You MUST have a  responsible person to drive you home. 8. Someone MUST be with you the first 24 hours after you arrive home or your discharge will be delayed. 9. Please wear clothes that are easy to get on and off and wear slip-on shoes.  Thank you for allowing [place] to care for you!   -- [place] [facility]

## 2020-09-27 ENCOUNTER — Other Ambulatory Visit: Payer: Self-pay

## 2020-09-27 ENCOUNTER — Encounter: Payer: Self-pay | Admitting: Nurse Practitioner

## 2020-09-27 ENCOUNTER — Ambulatory Visit (INDEPENDENT_AMBULATORY_CARE_PROVIDER_SITE_OTHER): Payer: Medicare HMO | Admitting: Nurse Practitioner

## 2020-09-27 VITALS — BP 131/71 | HR 64 | Temp 98.4°F | Resp 20 | Ht 63.0 in | Wt 213.0 lb

## 2020-09-27 DIAGNOSIS — G2581 Restless legs syndrome: Secondary | ICD-10-CM | POA: Diagnosis not present

## 2020-09-27 DIAGNOSIS — I2583 Coronary atherosclerosis due to lipid rich plaque: Secondary | ICD-10-CM

## 2020-09-27 DIAGNOSIS — E7800 Pure hypercholesterolemia, unspecified: Secondary | ICD-10-CM | POA: Diagnosis not present

## 2020-09-27 DIAGNOSIS — E559 Vitamin D deficiency, unspecified: Secondary | ICD-10-CM

## 2020-09-27 DIAGNOSIS — I2510 Atherosclerotic heart disease of native coronary artery without angina pectoris: Secondary | ICD-10-CM | POA: Diagnosis not present

## 2020-09-27 DIAGNOSIS — F3342 Major depressive disorder, recurrent, in full remission: Secondary | ICD-10-CM

## 2020-09-27 DIAGNOSIS — F411 Generalized anxiety disorder: Secondary | ICD-10-CM | POA: Diagnosis not present

## 2020-09-27 DIAGNOSIS — I6523 Occlusion and stenosis of bilateral carotid arteries: Secondary | ICD-10-CM

## 2020-09-27 DIAGNOSIS — E1159 Type 2 diabetes mellitus with other circulatory complications: Secondary | ICD-10-CM

## 2020-09-27 DIAGNOSIS — I10 Essential (primary) hypertension: Secondary | ICD-10-CM

## 2020-09-27 DIAGNOSIS — J449 Chronic obstructive pulmonary disease, unspecified: Secondary | ICD-10-CM

## 2020-09-27 LAB — BAYER DCA HB A1C WAIVED: HB A1C (BAYER DCA - WAIVED): 6.3 % (ref <7.0)

## 2020-09-27 MED ORDER — BUDESONIDE-FORMOTEROL FUMARATE 160-4.5 MCG/ACT IN AERO: 2.0000 | INHALATION_SPRAY | Freq: Two times a day (BID) | RESPIRATORY_TRACT | 3 refills | Status: DC

## 2020-09-27 MED ORDER — METOPROLOL TARTRATE 25 MG PO TABS: 25.0000 mg | ORAL_TABLET | Freq: Two times a day (BID) | ORAL | 1 refills | Status: DC

## 2020-09-27 MED ORDER — FUROSEMIDE 20 MG PO TABS
ORAL_TABLET | ORAL | 3 refills | Status: DC
Start: 2020-09-27 — End: 2021-01-14

## 2020-09-27 MED ORDER — ATORVASTATIN CALCIUM 80 MG PO TABS
80.0000 mg | ORAL_TABLET | Freq: Every day | ORAL | 1 refills | Status: DC
Start: 2020-09-27 — End: 2021-01-14

## 2020-09-27 MED ORDER — ALPRAZOLAM 1 MG PO TABS: 1.0000 mg | ORAL_TABLET | Freq: Three times a day (TID) | ORAL | 2 refills | Status: DC | PRN

## 2020-09-27 NOTE — Patient Instructions (Signed)

## 2020-09-27 NOTE — Progress Notes (Signed)
Subjective:    Patient ID: [name], female    DOB: [birth year], 65 y.o.   MRN: [id number]   Chief Complaint: medical management of chronic issues     HPI:  1. Primary hypertension Has constant chest pain, sob or headache. This is nothing unusual for her. Does not check blood pressure at home.she recently saw cardiology and is scheduled for cardiac cath on march 19. BP Readings from Last 3 Encounters:  09/24/20 126/76  06/27/20 (!) 163/69  05/16/20 (!) 169/79     2. Coronary artery disease due to lipid rich plaque Is on statin , but does not watch diet very closely  3. Bilateral carotid artery stenosis Last carotid u/s was done on 08/25/18, which showed less than 50% stenpsis. Will repeat in 1 year.  4. Pure hypercholesterolemia Does not watch diet and does very little to no exercise. Lab Results  Component Value Date   CHOL 152 06/27/2020   HDL 38 (L) 06/27/2020   LDLCALC 86 06/27/2020   LDLDIRECT 100.0 05/16/2007   TRIG 158 (H) 06/27/2020   CHOLHDL 4.0 06/27/2020      5. Type 2 diabetes mellitus with other circulatory complication, without long-term current use of insulin (HCC) She does not check her blood sugars at home. Doe snot watch diet and does no exercise. Lab Results  Component Value Date   HGBA1C 5.9 06/27/2020     6. Chronic obstructive pulmonary disease, unspecified COPD type ([place]) She is on BREO inhalers and very seldom uses her albuterol. Still smokes over a pack a day.  7. Generalized anxiety disorder Is still on xanax 3x a day. She stays very anxious. GAD 7 : Generalized Anxiety Score 09/27/2020 06/27/2020 03/26/2020 12/19/2019  Nervous, Anxious, on Edge 3 1 0 3  Control/stop worrying 3 3 1 3   Worry too much - different things 3 3 1 3   Trouble relaxing 1 1 0 3  Restless 0 0 0 3  Easily annoyed or irritable 0 1 0 3  Afraid - awful might happen 0 0 0 2  Total GAD 7 Score 10 9 2 20   Anxiety Difficulty Not difficult at all Somewhat difficult  Not difficult at all Very difficult      8. Recurrent major depressive disorder, in full remission [facility]) She is currently on lexapro and is doing ok. Her depression screen is unchanegd from previous ,  But she does not want to change meds Depression screen [facility] 2/9 09/27/2020 06/27/2020 03/26/2020  Decreased Interest 3 3 0  Down, Depressed, Hopeless 3 2 0  PHQ - 2 Score 6 5 0  Altered sleeping 3 3 0  Tired, decreased energy 3 3 0  Change in appetite 3 3 0  Feeling bad or failure about yourself  1 2 0  Trouble concentrating 3 3 0  Moving slowly or fidgety/restless 0 0 0  Suicidal thoughts 0 0 0  PHQ-9 Score 19 19 0  Difficult doing work/chores Not difficult at all Not difficult at all Not difficult at all  Some recent data might be hidden     9. Restless leg syndrome Has gotten some worse. Sh eis on mirapex and is having to take it 2 x a day now.  10. Vitamin D deficiency I son daily vitamin d supplement  11. Morbid obesity ([place]) No recent weight changes Wt Readings from Last 3 Encounters:  09/27/20 213 lb (96.6 kg)  09/24/20 213 lb (96.6 kg)  06/27/20 206 lb (93.4 kg)  BMI Readings from Last 3 Encounters:  09/27/20 37.73 kg/m  09/24/20 37.73 kg/m  06/27/20 36.49 kg/m       Outpatient Encounter Medications as of 09/27/2020  Medication Sig  . albuterol (PROVENTIL HFA;VENTOLIN HFA) 108 (90 Base) MCG/ACT inhaler Inhale 2 puffs into the lungs every 6 (six) hours as needed for wheezing or shortness of breath.  . Alcohol Swabs (B-D SINGLE USE SWABS REGULAR) PADS check blood sugars twice daily Dx E11.9  . ALPRAZolam (XANAX) 1 MG tablet Take 1 tablet (1 mg total) by mouth 3 (three) times daily as needed for anxiety. (Patient taking differently: Take 1 mg by mouth See admin instructions. Take 1 tablet (1 mg) by mouth scheduled twice daily, may take an additional tablet during the day if needed for anxiety.)  . amLODipine (NORVASC) 10 MG tablet Take 1 tablet (10 mg total) by  mouth daily. (Patient taking differently: Take 10 mg by mouth at bedtime.)  . aspirin 325 MG tablet Take 325 mg by mouth at bedtime.  [name] atorvastatin (LIPITOR) 80 MG tablet Take 1 tablet (80 mg total) by mouth daily. (Patient taking differently: Take 80 mg by mouth at bedtime.)  . Blood Glucose Calibration (TRUE METRIX LEVEL 1) Low SOLN Use with glucose monitor Dx E11.9  . Blood Glucose Monitoring Suppl (TRUE METRIX AIR GLUCOSE METER) w/Device KIT check blood sugars twice daily Dx E11.9  . Cholecalciferol (VITAMIN D3) 5000 units TABS Take 10,000 Units by mouth every evening.  . escitalopram (LEXAPRO) 20 MG tablet Take 1 tablet (20 mg total) by mouth daily.  . fish oil-omega-3 fatty acids 1000 MG capsule Take 1,000 mg by mouth at bedtime.  . fluticasone furoate-vilanterol (BREO ELLIPTA) 200-25 MCG/INH AEPB Inhale 1 puff into the lungs daily. (Patient taking differently: Inhale 1 puff into the lungs at bedtime.)  . furosemide (LASIX) 20 MG tablet TAKE 1 TABLET EVERY DAY AS NEEDED (Patient taking differently: Take 20 mg by mouth daily as needed (feet swelling).)  . glucose blood (TRUE METRIX BLOOD GLUCOSE TEST) test strip Use to check blood sugars twice daily Dx E11.9  . hydrOXYzine (VISTARIL) 25 MG capsule Take 1 capsule by mouth three times daily as needed (Patient taking differently: Take 25 mg by mouth 3 (three) times daily as needed (sinus issues.).)  . isosorbide mononitrate (IMDUR) 30 MG 24 hr tablet Take 0.5 tablets (15 mg total) by mouth daily. (Patient taking differently: Take 15 mg by mouth at bedtime.)  . lisinopril (ZESTRIL) 40 MG tablet Take 1 tablet (40 mg total) by mouth daily. (Patient taking differently: Take 40 mg by mouth every evening.)  . metFORMIN (GLUCOPHAGE) 500 MG tablet Take 1 tablet (500 mg total) by mouth 2 (two) times daily with a meal.  . metoprolol tartrate (LOPRESSOR) 25 MG tablet TAKE 1 TABLET TWICE DAILY (Patient taking differently: Take 25 mg by mouth 2 (two) times  daily.)  . pramipexole (MIRAPEX) 1 MG tablet Take 1 mg by mouth See admin instructions. Take 1 tablet (1 mg) by mouth scheduled at bedtime, may take an additional tablet during the day if needed for restless leg syndrome  . triamcinolone cream (KENALOG) 0.1 % Apply 1 application topically 2 (two) times daily. (Patient not taking: Reported on 09/27/2020)  . TRUEplus Lancets 33G MISC Use to check blood sugars twice daily Dx E11.9  . vitamin B-12 (CYANOCOBALAMIN) 1000 MCG tablet Take 1,000 mcg by mouth daily.   No facility-administered encounter medications on file as of 09/27/2020.    Past Surgical  History:  Procedure Laterality Date  . AXILLARY LYMPH NODE DISSECTION  07/31/2011   Procedure: AXILLARY LYMPH NODE DISSECTION;  Surgeon: [name], MD;  Location: [facility];  Service: General;  Laterality: Left;  left axillary sentinal node biopsy  . BREAST SURGERY    . CARDIAC CATHETERIZATION  11/02/2006; 06/03/2011  . CESAREAN SECTION  1987  . CORONARY STENT PLACEMENT  11/02/2006  . ELBOW SURGERY     right  . MASTECTOMY PARTIAL / LUMPECTOMY  06/30/2011   left    Family History  Problem Relation Age of Onset  . Heart disease Mother   . Heart disease Father   . Cancer Maternal Aunt        pt unaware of what kind  . Cancer Maternal Grandmother        ovarian  . Heart attack Sister   . Thyroid disease Daughter   . Anxiety disorder Daughter   . Depression Daughter   . Bipolar disorder Sister     New complaints: None today  Social history: Lives by herself. Family checks on her a couple of times a week.  Controlled substance contract: 07/03/20    Review of Systems  Constitutional: Negative for diaphoresis.  Eyes: Negative for pain.  Respiratory: Positive for shortness of breath. Negative for cough.   Cardiovascular: Negative for chest pain, palpitations and leg swelling.  Gastrointestinal: Negative for abdominal pain.  Endocrine: Negative for polydipsia.  Skin:  Negative for rash.  Neurological: Negative for dizziness, weakness and headaches.  Hematological: Does not bruise/bleed easily.  Psychiatric/Behavioral: The patient is nervous/anxious.   All other systems reviewed and are negative.      Objective:   Physical Exam Vitals and nursing note reviewed.  Constitutional:      General: She is not in acute distress.    Appearance: Normal appearance. She is well-developed and well-nourished.  HENT:     Head: Normocephalic.     Nose: Nose normal.     Mouth/Throat:     Mouth: Oropharynx is clear and moist.  Eyes:     Extraocular Movements: EOM normal.     Pupils: Pupils are equal, round, and reactive to light.  Neck:     Vascular: No carotid bruit or JVD.  Cardiovascular:     Rate and Rhythm: Normal rate and regular rhythm.     Pulses: Intact distal pulses.     Heart sounds: Normal heart sounds.  Pulmonary:     Effort: Pulmonary effort is normal. No respiratory distress.     Breath sounds: Normal breath sounds. No wheezing or rales.  Chest:     Chest wall: No tenderness.  Abdominal:     General: Bowel sounds are normal. There is no distension or abdominal bruit. Aorta is normal.     Palpations: Abdomen is soft. There is no hepatomegaly, splenomegaly, mass or pulsatile mass.     Tenderness: There is no abdominal tenderness.  Musculoskeletal:        General: No edema. Normal range of motion.     Cervical back: Normal range of motion and neck supple.  Lymphadenopathy:     Cervical: No cervical adenopathy.  Skin:    General: Skin is warm and dry.  Neurological:     Mental Status: She is alert and oriented to person, place, and time.     Deep Tendon Reflexes: Reflexes are normal and symmetric.  Psychiatric:        Mood and Affect: Mood and affect normal.  Behavior: Behavior normal.        Thought Content: Thought content normal.        Judgment: Judgment normal.     BP 131/71   Pulse 64   Temp 98.4 F (36.9 C) (Temporal)    Resp 20   Ht 5' 3"  (1.6 m)   Wt 213 lb (96.6 kg)   LMP 10/17/2005   SpO2 93%   BMI 37.73 kg/m   HGBA1c 6.3%     Assessment & Plan:  ZAHRIYAH JOO comes in today with chief complaint of Medical Management of Chronic Issues   Diagnosis and orders addressed:  1. Primary hypertension Low sodium diet - CBC with Differential/Platelet - CMP14+EGFR - furosemide (LASIX) 20 MG tablet; TAKE 1 TABLET EVERY DAY AS NEEDED  Dispense: 30 tablet; Refill: 3  2. Coronary artery disease due to lipid rich plaque Keep appointment for cardiac cath - metoprolol tartrate (LOPRESSOR) 25 MG tablet; Take 1 tablet (25 mg total) by mouth 2 (two) times daily.  Dispense: 180 tablet; Refill: 1  3. Bilateral carotid artery stenosis Will recheck in1 year  4. Pure hypercholesterolemia Low fat diet - Lipid panel - atorvastatin (LIPITOR) 80 MG tablet; Take 1 tablet (80 mg total) by mouth at bedtime.  Dispense: 90 tablet; Refill: 1  5. Type 2 diabetes mellitus with other circulatory complication, without long-term current use of insulin (HCC) Continue to watch carbs in diet - Bayer DCA Hb A1c Waived - Ambulatory referral to Podiatry  6. Chronic obstructive pulmonary disease, unspecified COPD type ([place]) changed from BREO  - budesonide-formoterol (SYMBICORT) 160-4.5 MCG/ACT inhaler; Inhale 2 puffs into the lungs 2 (two) times daily.  Dispense: 1 each; Refill: 3  7. Generalized anxiety disorder Stress management - ALPRAZolam (XANAX) 1 MG tablet; Take 1 tablet (1 mg total) by mouth 3 (three) times daily as needed for anxiety.  Dispense: 90 tablet; Refill: 2  8. Recurrent major depressive disorder, in full remission ([place]) Continue current meds  9. Restless leg syndrome Keep legs warm at night  10. Vitamin D deficiency continue daily vitamin d supplement  11. Morbid obesity ([place]) Discussed diet and exercise for person with BMI >25 Will recheck weight in 3-6 months    Mammogram scheduled Labs  pending Health Maintenance reviewed Diet and exercise encouraged  Follow up plan: 3 months   [name], FNP

## 2020-09-28 LAB — CBC WITH DIFFERENTIAL/PLATELET
Basophils Absolute: 0.1 10*3/uL (ref 0.0–0.2)
Basos: 1 %
EOS (ABSOLUTE): 0.3 10*3/uL (ref 0.0–0.4)
Eos: 2 %
Hematocrit: 45.8 % (ref 34.0–46.6)
Hemoglobin: 14.4 g/dL (ref 11.1–15.9)
Immature Grans (Abs): 0.1 10*3/uL (ref 0.0–0.1)
Immature Granulocytes: 1 %
Lymphocytes Absolute: 1.3 10*3/uL (ref 0.7–3.1)
Lymphs: 12 %
MCH: 24.8 pg — ABNORMAL LOW (ref 26.6–33.0)
MCHC: 31.4 g/dL — ABNORMAL LOW (ref 31.5–35.7)
MCV: 79 fL (ref 79–97)
Monocytes Absolute: 0.6 10*3/uL (ref 0.1–0.9)
Monocytes: 6 %
Neutrophils Absolute: 8.7 10*3/uL — ABNORMAL HIGH (ref 1.4–7.0)
Neutrophils: 78 %
Platelets: 262 10*3/uL (ref 150–450)
RBC: 5.8 x10E6/uL — ABNORMAL HIGH (ref 3.77–5.28)
RDW: 16.6 % — ABNORMAL HIGH (ref 11.7–15.4)
WBC: 11 10*3/uL — ABNORMAL HIGH (ref 3.4–10.8)

## 2020-09-28 LAB — CMP14+EGFR
ALT: 13 IU/L (ref 0–32)
AST: 13 IU/L (ref 0–40)
Albumin/Globulin Ratio: 1.8 (ref 1.2–2.2)
Albumin: 4.5 g/dL (ref 3.8–4.8)
Alkaline Phosphatase: 103 IU/L (ref 44–121)
BUN/Creatinine Ratio: 13 (ref 12–28)
BUN: 11 mg/dL (ref 8–27)
Bilirubin Total: 0.3 mg/dL (ref 0.0–1.2)
CO2: 24 mmol/L (ref 20–29)
Calcium: 9.7 mg/dL (ref 8.7–10.3)
Chloride: 101 mmol/L (ref 96–106)
Creatinine, Ser: 0.87 mg/dL (ref 0.57–1.00)
Globulin, Total: 2.5 g/dL (ref 1.5–4.5)
Glucose: 113 mg/dL — ABNORMAL HIGH (ref 65–99)
Potassium: 4.8 mmol/L (ref 3.5–5.2)
Sodium: 139 mmol/L (ref 134–144)
Total Protein: 7 g/dL (ref 6.0–8.5)
eGFR: 74 mL/min/{1.73_m2} (ref >59)

## 2020-09-28 LAB — LIPID PANEL
Chol/HDL Ratio: 3.5 ratio (ref 0.0–4.4)
Cholesterol, Total: 148 mg/dL (ref 100–199)
HDL: 42 mg/dL (ref >39)
LDL Chol Calc (NIH): 80 mg/dL (ref 0–99)
Triglycerides: 146 mg/dL (ref 0–149)
VLDL Cholesterol Cal: 26 mg/dL (ref 5–40)

## 2020-09-30 ENCOUNTER — Telehealth: Payer: Self-pay | Admitting: Nurse Practitioner

## 2020-10-01 ENCOUNTER — Telehealth: Payer: Self-pay | Admitting: Cardiology

## 2020-10-01 ENCOUNTER — Telehealth: Payer: Self-pay

## 2020-10-01 NOTE — Telephone Encounter (Signed)
Please advise if this is okay with you and if so, please enter order.

## 2020-10-01 NOTE — Telephone Encounter (Signed)
Pt says to cancel message because she has to go to [place] to be tested.

## 2020-10-01 NOTE — Telephone Encounter (Signed)
[name] is calling with some questions for the nurse in regards to her upcoming procedure. Please advise.

## 2020-10-01 NOTE — Telephone Encounter (Signed)
Spoke with patient, answered questions about pre-procedure COVID-19 testing, pt aware pre-procedure COVID-19 test needs to be done at [facility] facility.

## 2020-10-01 NOTE — Telephone Encounter (Signed)
Will forward this call to our Procedure Nurse Navigator to review and follow-up with the pt, in regards to questions about upcoming cath on 10/07/20.

## 2020-10-03 ENCOUNTER — Telehealth: Payer: Self-pay | Admitting: *Deleted

## 2020-10-03 NOTE — Telephone Encounter (Signed)
Pt contacted pre-catheterization scheduled at [facility] for: Monday October 07, 2020 10:30 AM Verified arrival time and place: [facility]) at: 8:30 AM   No solid food after midnight prior to cath, clear liquids until 5 AM day of procedure.  Hold: Metformin-day of procedure and 48 hours post procedure Lasix-AM of procedure  Except hold medications AM meds can be  taken pre-cath with sips of water including: ASA 81 mg   Confirmed patient has responsible adult to drive home post procedure and be with patient first 24 hours after arriving home: yes  You are allowed ONE visitor in the waiting room during the time you are at the hospital for your procedure. Both you and your visitor must wear a mask once you enter the hospital.    Reviewed procedure/mask/visitor instructions with patient.

## 2020-10-04 ENCOUNTER — Other Ambulatory Visit: Payer: Medicare HMO

## 2020-10-04 ENCOUNTER — Other Ambulatory Visit (HOSPITAL_COMMUNITY)
Admission: RE | Admit: 2020-10-04 | Discharge: 2020-10-04 | Disposition: A | Discharge status: Home / Self Care | Payer: Medicare HMO | Source: Ambulatory Visit | Attending: Cardiovascular Disease | Admitting: Cardiovascular Disease

## 2020-10-04 DIAGNOSIS — Z20822 Contact with and (suspected) exposure to covid-19: Secondary | ICD-10-CM | POA: Diagnosis not present

## 2020-10-04 DIAGNOSIS — Z01812 Encounter for preprocedural laboratory examination: Secondary | ICD-10-CM | POA: Diagnosis not present

## 2020-10-04 LAB — SARS CORONAVIRUS 2 (TAT 6-24 HRS): SARS Coronavirus 2: NEGATIVE

## 2020-10-07 ENCOUNTER — Ambulatory Visit (HOSPITAL_COMMUNITY)
Admission: RE | Admit: 2020-10-07 | Discharge: 2020-10-07 | Disposition: A | Discharge status: Home / Self Care | Payer: Medicare HMO | Attending: Cardiovascular Disease | Admitting: Cardiovascular Disease

## 2020-10-07 ENCOUNTER — Encounter (HOSPITAL_COMMUNITY): Payer: Self-pay | Admitting: Cardiovascular Disease

## 2020-10-07 ENCOUNTER — Other Ambulatory Visit: Payer: Self-pay

## 2020-10-07 ENCOUNTER — Encounter (HOSPITAL_COMMUNITY)
Admission: RE | Disposition: A | Discharge status: Home / Self Care | Payer: Self-pay | Source: Home / Self Care | Attending: Cardiovascular Disease

## 2020-10-07 DIAGNOSIS — I25119 Atherosclerotic heart disease of native coronary artery with unspecified angina pectoris: Secondary | ICD-10-CM | POA: Diagnosis not present

## 2020-10-07 DIAGNOSIS — Z7984 Long term (current) use of oral hypoglycemic drugs: Secondary | ICD-10-CM | POA: Insufficient documentation

## 2020-10-07 DIAGNOSIS — I252 Old myocardial infarction: Secondary | ICD-10-CM | POA: Insufficient documentation

## 2020-10-07 DIAGNOSIS — F17210 Nicotine dependence, cigarettes, uncomplicated: Secondary | ICD-10-CM | POA: Diagnosis not present

## 2020-10-07 DIAGNOSIS — Z955 Presence of coronary angioplasty implant and graft: Secondary | ICD-10-CM | POA: Insufficient documentation

## 2020-10-07 DIAGNOSIS — R0609 Other forms of dyspnea: Secondary | ICD-10-CM | POA: Insufficient documentation

## 2020-10-07 DIAGNOSIS — Z7982 Long term (current) use of aspirin: Secondary | ICD-10-CM | POA: Diagnosis not present

## 2020-10-07 DIAGNOSIS — I2510 Atherosclerotic heart disease of native coronary artery without angina pectoris: Secondary | ICD-10-CM | POA: Diagnosis not present

## 2020-10-07 DIAGNOSIS — I209 Angina pectoris, unspecified: Secondary | ICD-10-CM | POA: Diagnosis present

## 2020-10-07 DIAGNOSIS — Z79899 Other long term (current) drug therapy: Secondary | ICD-10-CM | POA: Insufficient documentation

## 2020-10-07 DIAGNOSIS — I2582 Chronic total occlusion of coronary artery: Secondary | ICD-10-CM | POA: Diagnosis not present

## 2020-10-07 DIAGNOSIS — R0789 Other chest pain: Secondary | ICD-10-CM | POA: Diagnosis not present

## 2020-10-07 HISTORY — PX: LEFT HEART CATH AND CORONARY ANGIOGRAPHY: CATH118249

## 2020-10-07 SURGERY — LEFT HEART CATH AND CORONARY ANGIOGRAPHY
Anesthesia: LOCAL

## 2020-10-07 MED ORDER — ASPIRIN 81 MG PO CHEW: 81.0000 mg | CHEWABLE_TABLET | ORAL | Status: DC

## 2020-10-07 MED ORDER — SODIUM CHLORIDE 0.9 % WEIGHT BASED INFUSION: 1.0000 mL/kg/h | INTRAVENOUS | Status: DC

## 2020-10-07 MED ORDER — SODIUM CHLORIDE 0.9 % WEIGHT BASED INFUSION
3.0000 mL/kg/h | INTRAVENOUS | Status: AC
  Administered 2020-10-07: 3 mL/kg/h via INTRAVENOUS

## 2020-10-07 MED ORDER — SODIUM CHLORIDE 0.9 % IV SOLN: 250.0000 mL | INTRAVENOUS | Status: DC | PRN

## 2020-10-07 MED ORDER — IOHEXOL 350 MG/ML SOLN
INTRAVENOUS | Status: DC | PRN
  Administered 2020-10-07: 80 mL via INTRA_ARTERIAL

## 2020-10-07 MED ORDER — HEPARIN SODIUM (PORCINE) 1000 UNIT/ML IJ SOLN
INTRAMUSCULAR | Status: DC | PRN
  Administered 2020-10-07: 5000 [IU] via INTRAVENOUS

## 2020-10-07 MED ORDER — SODIUM CHLORIDE 0.9% FLUSH: 3.0000 mL | Freq: Two times a day (BID) | INTRAVENOUS | Status: DC

## 2020-10-07 MED ORDER — ONDANSETRON HCL 4 MG/2ML IJ SOLN: 4.0000 mg | Freq: Four times a day (QID) | INTRAMUSCULAR | Status: DC | PRN

## 2020-10-07 MED ORDER — FENTANYL CITRATE (PF) 100 MCG/2ML IJ SOLN
INTRAMUSCULAR | Status: DC | PRN
  Administered 2020-10-07: 25 ug via INTRAVENOUS

## 2020-10-07 MED ORDER — VERAPAMIL HCL 2.5 MG/ML IV SOLN
INTRAVENOUS | Status: AC
  Filled 2020-10-07: qty 2

## 2020-10-07 MED ORDER — MIDAZOLAM HCL 2 MG/2ML IJ SOLN
INTRAMUSCULAR | Status: AC
  Filled 2020-10-07: qty 2

## 2020-10-07 MED ORDER — LABETALOL HCL 5 MG/ML IV SOLN
10.0000 mg | INTRAVENOUS | Status: DC | PRN
Start: 2020-10-07 — End: 2020-10-07

## 2020-10-07 MED ORDER — HEPARIN (PORCINE) IN NACL 1000-0.9 UT/500ML-% IV SOLN
INTRAVENOUS | Status: AC
  Filled 2020-10-07: qty 1000

## 2020-10-07 MED ORDER — HEPARIN (PORCINE) IN NACL 1000-0.9 UT/500ML-% IV SOLN
INTRAVENOUS | Status: DC | PRN
  Administered 2020-10-07 (×2): 500 mL

## 2020-10-07 MED ORDER — ACETAMINOPHEN 325 MG PO TABS: 650.0000 mg | ORAL_TABLET | ORAL | Status: DC | PRN

## 2020-10-07 MED ORDER — HYDRALAZINE HCL 20 MG/ML IJ SOLN: 10.0000 mg | INTRAMUSCULAR | Status: DC | PRN

## 2020-10-07 MED ORDER — VERAPAMIL HCL 2.5 MG/ML IV SOLN
INTRAVENOUS | Status: DC | PRN
  Administered 2020-10-07: 10 mL via INTRA_ARTERIAL

## 2020-10-07 MED ORDER — LIDOCAINE HCL (PF) 1 % IJ SOLN
INTRAMUSCULAR | Status: DC | PRN
  Administered 2020-10-07: 2 mL

## 2020-10-07 MED ORDER — SODIUM CHLORIDE 0.9% FLUSH: 3.0000 mL | INTRAVENOUS | Status: DC | PRN

## 2020-10-07 MED ORDER — FENTANYL CITRATE (PF) 100 MCG/2ML IJ SOLN
INTRAMUSCULAR | Status: AC
  Filled 2020-10-07: qty 2

## 2020-10-07 MED ORDER — HEPARIN SODIUM (PORCINE) 1000 UNIT/ML IJ SOLN
INTRAMUSCULAR | Status: AC
  Filled 2020-10-07: qty 1

## 2020-10-07 MED ORDER — LIDOCAINE HCL (PF) 1 % IJ SOLN
INTRAMUSCULAR | Status: AC
  Filled 2020-10-07: qty 30

## 2020-10-07 MED ORDER — MIDAZOLAM HCL 2 MG/2ML IJ SOLN
INTRAMUSCULAR | Status: DC | PRN
  Administered 2020-10-07: 1 mg via INTRAVENOUS

## 2020-10-07 SURGICAL SUPPLY — 10 items
CATH 5FR JL3.5 JR4 ANG PIG MP (CATHETERS) ×1 IMPLANT
CATH INFINITI 5 FR 3DRC (CATHETERS) ×1 IMPLANT
DEVICE RAD COMP TR BAND LRG (VASCULAR PRODUCTS) ×1 IMPLANT
GLIDESHEATH SLEND SS 6F .021 (SHEATH) ×1 IMPLANT
GUIDEWIRE INQWIRE 1.5J.035X260 (WIRE) IMPLANT
INQWIRE 1.5J .035X260CM (WIRE) ×2
KIT HEART LEFT (KITS) ×2 IMPLANT
PACK CARDIAC CATHETERIZATION (CUSTOM PROCEDURE TRAY) ×2 IMPLANT
TRANSDUCER W/STOPCOCK (MISCELLANEOUS) ×2 IMPLANT
TUBING CIL FLEX 10 FLL-RA (TUBING) ×2 IMPLANT

## 2020-10-07 NOTE — Interval H&P Note (Signed)
History and Physical Interval Note:  10/07/2020 10:02 AM  [name]  has presented today for surgery, with the diagnosis of cad - chest pain.  The various methods of treatment have been discussed with the patient and family. After consideration of risks, benefits and other options for treatment, the patient has consented to  Procedure(s): LEFT HEART CATH AND CORONARY ANGIOGRAPHY (N/A) as a surgical intervention.  The patient's history has been reviewed, patient examined, no change in status, stable for surgery.  I have reviewed the patient's chart and labs.  Questions were answered to the patient's satisfaction.     [name]

## 2020-10-07 NOTE — Discharge Instructions (Signed)
DRINK PLENTY OF FLUIDS OVER THE NEXT 2-3 DAYS.  Radial Site Care  This sheet gives you information about how to care for yourself after your procedure. Your health care provider may also give you more specific instructions. If you have problems or questions, contact your health care provider. What can I expect after the procedure? After the procedure, it is common to have:  Bruising and tenderness at the catheter insertion area. Follow these instructions at home: Medicines  Take over-the-counter and prescription medicines only as told by your health care provider. Insertion site care  Follow instructions from your health care provider about how to take care of your insertion site. Make sure you: ? Wash your hands with soap and water before you change your bandage (dressing). If soap and water are not available, use hand sanitizer. ? Change your dressing as told by your health care provider. ? Leave stitches (sutures), skin glue, or adhesive strips in place. These skin closures may need to stay in place for 2 weeks or longer. If adhesive strip edges start to loosen and curl up, you may trim the loose edges. Do not remove adhesive strips completely unless your health care provider tells you to do that.  Check your insertion site every day for signs of infection. Check for: ? Redness, swelling, or pain. ? Fluid or blood. ? Pus or a bad smell. ? Warmth.  Do not take baths, swim, or use a hot tub until your health care provider approves.  You may shower 24-48 hours after the procedure, or as directed by your health care provider. ? Remove the dressing and gently wash the site with plain soap and water. ? Pat the area dry with a clean towel. ? Do not rub the site. That could cause bleeding.  Do not apply powder or lotion to the site. Activity  For 24 hours after the procedure, or as directed by your health care provider: ? Do not flex or bend the affected arm. ? Do not push or pull  heavy objects with the affected arm. ? Do not drive yourself home from the hospital or clinic. You may drive 24 hours after the procedure unless your health care provider tells you not to. ? Do not operate machinery or power tools.  Do not lift anything that is heavier than 10 lb (4.5 kg), or the limit that you are told, until your health care provider says that it is safe.  Ask your health care provider when it is okay to: ? Return to work or school. ? Resume usual physical activities or sports. ? Resume sexual activity.   General instructions  If the catheter site starts to bleed, raise your arm and put firm pressure on the site. If the bleeding does not stop, get help right away. This is a medical emergency.  If you went home on the same day as your procedure, a responsible adult should be with you for the first 24 hours after you arrive home.  Keep all follow-up visits as told by your health care provider. This is important. Contact a health care provider if:  You have a fever.  You have redness, swelling, or yellow drainage around your insertion site. Get help right away if:  You have unusual pain at the radial site.  The catheter insertion area swells very fast.  The insertion area is bleeding, and the bleeding does not stop when you hold steady pressure on the area.  Your arm or hand becomes pale,   cool, tingly, or numb. These symptoms may represent a serious problem that is an emergency. Do not wait to see if the symptoms will go away. Get medical help right away. Call your local emergency services (911 in the [place] Do not drive yourself to the hospital. Summary  After the procedure, it is common to have bruising and tenderness at the site.  Follow instructions from your health care provider about how to take care of your radial site wound. Check the wound every day for signs of infection.  Do not lift anything that is heavier than 10 lb (4.5 kg), or the limit that you  are told, until your health care provider says that it is safe. This information is not intended to replace advice given to you by your health care provider. Make sure you discuss any questions you have with your health care provider. Document Revised: 08/18/2017 Document Reviewed: 08/18/2017 Elsevier Patient Education  2021 [employer].  

## 2020-10-08 ENCOUNTER — Telehealth: Payer: Self-pay | Admitting: Cardiovascular Disease

## 2020-10-08 NOTE — Telephone Encounter (Signed)
Patient called and wanted to know if she could take off her dressing from the cath performed yesterday. Please call back

## 2020-10-09 NOTE — Telephone Encounter (Signed)
The patient has removed her bandage and says her site looks nice and clean. She will shower soon and understands to avoid direct water pressure, immersing the site in water, and rubbing the site.  She will call if signs of infection occur.  She was grateful for call and agrees with plan.

## 2020-10-10 ENCOUNTER — Ambulatory Visit: Payer: Medicare HMO | Admitting: Licensed Clinical Social Worker

## 2020-10-10 DIAGNOSIS — E1159 Type 2 diabetes mellitus with other circulatory complications: Secondary | ICD-10-CM

## 2020-10-10 DIAGNOSIS — R0602 Shortness of breath: Secondary | ICD-10-CM

## 2020-10-10 DIAGNOSIS — I2583 Coronary atherosclerosis due to lipid rich plaque: Secondary | ICD-10-CM

## 2020-10-10 DIAGNOSIS — F3342 Major depressive disorder, recurrent, in full remission: Secondary | ICD-10-CM

## 2020-10-10 DIAGNOSIS — E7800 Pure hypercholesterolemia, unspecified: Secondary | ICD-10-CM

## 2020-10-10 DIAGNOSIS — I2510 Atherosclerotic heart disease of native coronary artery without angina pectoris: Secondary | ICD-10-CM

## 2020-10-10 DIAGNOSIS — I10 Essential (primary) hypertension: Secondary | ICD-10-CM

## 2020-10-10 DIAGNOSIS — F411 Generalized anxiety disorder: Secondary | ICD-10-CM

## 2020-10-10 NOTE — Chronic Care Management (AMB) (Signed)
Chronic Care Management    Clinical Social Work Note  10/10/2020 Name: [name] MRN: [id number] DOB: 1956-06-03  [name] is a 65 y.o. year old female who is a primary care patient of [name], [place]. The CCM team was consulted to assist the patient with chronic disease management and/or care coordination needs related to: [name] .   [name] called client phone number today to attempt to engage client in follow up telephone call  in response to provider referral for social work chronic care management and care coordination services.   Consent to Services:  The patient was given information about Chronic Care Management services, agreed to services, and gave verbal consent prior to initiation of services.  Please see initial visit note for detailed documentation.   Patient agreed to services and consent obtained.   Assessment: Review of patient past medical history, allergies, medications, and health status, including review of relevant consultants reports was performed today as part of a comprehensive evaluation and provision of chronic care management and care coordination services.     SDOH (Social Determinants of Health) assessments and interventions performed:    Advanced Directives Status: See Vynca application for related entries.  CCM Care Plan  Allergies  Allergen Reactions  . Food Other (See Comments)    Nutmeg= difficulty breathing  . Nutmeg Oil (Myristica Oil) Hives  . Other Other (See Comments)    Nutmeg= difficulty breathing    Outpatient Encounter Medications as of 10/10/2020  Medication Sig  . albuterol (PROVENTIL HFA;VENTOLIN HFA) 108 (90 Base) MCG/ACT inhaler Inhale 2 puffs into the lungs every 6 (six) hours as needed for wheezing or shortness of breath.  . Alcohol Swabs (B-D SINGLE USE SWABS REGULAR) PADS check blood sugars twice daily Dx E11.9  . ALPRAZolam (XANAX) 1 MG tablet Take 1 tablet (1 mg total) by mouth 3 (three) times  daily as needed for anxiety.  [name] amLODipine (NORVASC) 10 MG tablet Take 1 tablet (10 mg total) by mouth daily. (Patient taking differently: Take 10 mg by mouth at bedtime.)  . aspirin 325 MG tablet Take 325 mg by mouth at bedtime.  [name] atorvastatin (LIPITOR) 80 MG tablet Take 1 tablet (80 mg total) by mouth at bedtime.  . Blood Glucose Calibration (TRUE METRIX LEVEL 1) Low SOLN Use with glucose monitor Dx E11.9  . Blood Glucose Monitoring Suppl (TRUE METRIX AIR GLUCOSE METER) w/Device KIT check blood sugars twice daily Dx E11.9  . budesonide-formoterol (SYMBICORT) 160-4.5 MCG/ACT inhaler Inhale 2 puffs into the lungs 2 (two) times daily.  . Cholecalciferol (VITAMIN D3) 5000 units TABS Take 10,000 Units by mouth every evening.  . escitalopram (LEXAPRO) 20 MG tablet Take 1 tablet (20 mg total) by mouth daily.  . fish oil-omega-3 fatty acids 1000 MG capsule Take 1,000 mg by mouth at bedtime.  . furosemide (LASIX) 20 MG tablet TAKE 1 TABLET EVERY DAY AS NEEDED  . glucose blood (TRUE METRIX BLOOD GLUCOSE TEST) test strip Use to check blood sugars twice daily Dx E11.9  . hydrOXYzine (VISTARIL) 25 MG capsule Take 1 capsule by mouth three times daily as needed (Patient taking differently: Take 25 mg by mouth 3 (three) times daily as needed (sinus issues.).)  . isosorbide mononitrate (IMDUR) 30 MG 24 hr tablet Take 0.5 tablets (15 mg total) by mouth daily. (Patient taking differently: Take 15 mg by mouth at bedtime.)  . lisinopril (ZESTRIL) 40 MG tablet Take 1 tablet (40 mg total) by mouth daily. (Patient taking  differently: Take 40 mg by mouth every evening.)  . metFORMIN (GLUCOPHAGE) 500 MG tablet Take 1 tablet (500 mg total) by mouth 2 (two) times daily with a meal.  . metoprolol tartrate (LOPRESSOR) 25 MG tablet Take 1 tablet (25 mg total) by mouth 2 (two) times daily.  . pramipexole (MIRAPEX) 1 MG tablet Take 1 mg by mouth See admin instructions. Take 1 tablet (1 mg) by mouth scheduled at bedtime, may take  an additional tablet during the day if needed for restless leg syndrome  . TRUEplus Lancets 33G MISC Use to check blood sugars twice daily Dx E11.9  . vitamin B-12 (CYANOCOBALAMIN) 1000 MCG tablet Take 1,000 mcg by mouth daily.   No facility-administered encounter medications on file as of 10/10/2020.    Patient Active Problem List   Diagnosis Date Noted  . Depression 04/08/2016  . Morbid obesity ([place]) 12/20/2015  . Type 2 diabetes mellitus ([place]) 04/18/2015  . Restless leg syndrome 03/12/2015  . Vitamin D deficiency 03/12/2015  . Generalized anxiety disorder 02/14/2014  . Malignant neoplasm of upper-inner quadrant of female breast ([place]) 07/30/2011  . Myocardial infarction ([place])   . Angina pectoris ([place]) 05/19/2011  . Hypertension   . Hyperlipidemia   . CAD (coronary artery disease)   . COPD (chronic obstructive pulmonary disease) ([place])   . Carotid stenosis     Conditions to be addressed/monitored: ; Monitor mental health needs   [name] called client home phone number today to try to speak with client via phone about her needs. [name] was not able to speak via phone today with client. Phone message was not set up on client phone and thus [name] could not leave client phone message. [name] also called phone number for [name] daughter of client today. [name] was not able to speak via phone today with [name]. Phone message was not set up on phone of client's daughter, [name], and thus [name] could not leave a message for Lilia Pro today  Follow Up Plan: [name] to call client or [name], daughter, in 4 weeks to talk with client or daughter about client social work needs      [name].Lemarcus Baggerly MSW, [name] Licensed Clinical Social Worker [facility] Care Management [phone]

## 2020-10-10 NOTE — Patient Instructions (Signed)
Visit Information  Chronic Care Management   Clinical Social Work Note   10/10/2020  Name: [name] MRN: [id number] DOB: Jun 18, 1956   [name] is a 65 y.o. year old female who is a primary care patient of [name], [name]. The CCM team was consulted to assist the patient with chronic disease management and/or care coordination needs related to: [name] .   [name] called client phone number today to attempt to engage client in follow up telephone call in response to provider referral for social work chronic care management and care coordination services.   Consent to Services:   The patient was given information about Chronic Care Management services, agreed to services, and gave verbal consent prior to initiation of services. Please see initial visit note for detailed documentation.   Patient agreed to services and consent obtained.   Assessment: Review of patient past medical history, allergies, medications, and health status, including review of relevant consultants reports was performed today as part of a comprehensive evaluation and provision of chronic care management and care coordination services.   SDOH (Social Determinants of Health) assessments and interventions performed:   Advanced Directives Status: See Vynca application for related entries.  CCM Care Plan       Allergies  Allergen Reactions  . Food Other (See Comments)    Nutmeg= difficulty breathing  . Nutmeg Oil (Myristica Oil) Hives  . Other Other (See Comments)    Nutmeg= difficulty breathing       Outpatient Encounter Medications as of 10/10/2020  Medication Sig  . albuterol (PROVENTIL HFA;VENTOLIN HFA) 108 (90 Base) MCG/ACT inhaler Inhale 2 puffs into the lungs every 6 (six) hours as needed for wheezing or shortness of breath.  . Alcohol Swabs (B-D SINGLE USE SWABS REGULAR) PADS check blood sugars twice daily Dx E11.9  . ALPRAZolam (XANAX) 1 MG tablet Take 1 tablet (1 mg total)  by mouth 3 (three) times daily as needed for anxiety.  [name] amLODipine (NORVASC) 10 MG tablet Take 1 tablet (10 mg total) by mouth daily. (Patient taking differently: Take 10 mg by mouth at bedtime.)  . aspirin 325 MG tablet Take 325 mg by mouth at bedtime.  [name] atorvastatin (LIPITOR) 80 MG tablet Take 1 tablet (80 mg total) by mouth at bedtime.  . Blood Glucose Calibration (TRUE METRIX LEVEL 1) Low SOLN Use with glucose monitor Dx E11.9  . Blood Glucose Monitoring Suppl (TRUE METRIX AIR GLUCOSE METER) w/Device KIT check blood sugars twice daily Dx E11.9  . budesonide-formoterol (SYMBICORT) 160-4.5 MCG/ACT inhaler Inhale 2 puffs into the lungs 2 (two) times daily.  . Cholecalciferol (VITAMIN D3) 5000 units TABS Take 10,000 Units by mouth every evening.  . escitalopram (LEXAPRO) 20 MG tablet Take 1 tablet (20 mg total) by mouth daily.  . fish oil-omega-3 fatty acids 1000 MG capsule Take 1,000 mg by mouth at bedtime.  . furosemide (LASIX) 20 MG tablet TAKE 1 TABLET EVERY DAY AS NEEDED  . glucose blood (TRUE METRIX BLOOD GLUCOSE TEST) test strip Use to check blood sugars twice daily Dx E11.9  . hydrOXYzine (VISTARIL) 25 MG capsule Take 1 capsule by mouth three times daily as needed (Patient taking differently: Take 25 mg by mouth 3 (three) times daily as needed (sinus issues.).)  . isosorbide mononitrate (IMDUR) 30 MG 24 hr tablet Take 0.5 tablets (15 mg total) by mouth daily. (Patient taking differently: Take 15 mg by mouth at bedtime.)  . lisinopril (ZESTRIL) 40 MG tablet Take 1  tablet (40 mg total) by mouth daily. (Patient taking differently: Take 40 mg by mouth every evening.)  . metFORMIN (GLUCOPHAGE) 500 MG tablet Take 1 tablet (500 mg total) by mouth 2 (two) times daily with a meal.  . metoprolol tartrate (LOPRESSOR) 25 MG tablet Take 1 tablet (25 mg total) by mouth 2 (two) times daily.  . pramipexole (MIRAPEX) 1 MG tablet Take 1 mg by mouth See admin instructions. Take 1 tablet (1 mg) by mouth  scheduled at bedtime, may take an additional tablet during the day if needed for restless leg syndrome  . TRUEplus Lancets 33G MISC Use to check blood sugars twice daily Dx E11.9  . vitamin B-12 (CYANOCOBALAMIN) 1000 MCG tablet Take 1,000 mcg by mouth daily.   No facility-administered encounter medications on file as of 10/10/2020.       Patient Active Problem List   Diagnosis Date Noted  . Depression 04/08/2016  . Morbid obesity ([place]) 12/20/2015  . Type 2 diabetes mellitus ([place]) 04/18/2015  . Restless leg syndrome 03/12/2015  . Vitamin D deficiency 03/12/2015  . Generalized anxiety disorder 02/14/2014  . Malignant neoplasm of upper-inner quadrant of female breast ([place]) 07/30/2011  . Myocardial infarction ([place])   . Angina pectoris ([place]) 05/19/2011  . Hypertension   . Hyperlipidemia   . CAD (coronary artery disease)   . COPD (chronic obstructive pulmonary disease) ([place])   . Carotid stenosis    Conditions to be addressed/monitored: ; Monitor mental health needs   [name] called client home phone number today to try to speak with client via phone about her needs. [name] was not able to speak via phone today with client. Phone message was not set up on client phone and thus [name] could not leave client phone message. [name] also called phone number for [name] daughter of client today. [name] was not able to speak via phone today with [name]. Phone message was not set up on phone of client's daughter, [name], and thus [name] could not leave a message for [name] today   Follow Up Plan: [name] to call client or [name], daughter, in 4 weeks to talk with client or daughter about client social work needs   [name] MSW, [name] Licensed Clinical Social Worker [facility] Care Management [phone]

## 2020-10-20 ENCOUNTER — Other Ambulatory Visit: Payer: Self-pay | Admitting: Nurse Practitioner

## 2020-10-20 DIAGNOSIS — E7800 Pure hypercholesterolemia, unspecified: Secondary | ICD-10-CM

## 2020-10-24 ENCOUNTER — Ambulatory Visit: Payer: Medicare HMO | Admitting: Cardiology

## 2020-11-20 ENCOUNTER — Ambulatory Visit (INDEPENDENT_AMBULATORY_CARE_PROVIDER_SITE_OTHER): Payer: Medicare HMO | Admitting: Licensed Clinical Social Worker

## 2020-11-20 DIAGNOSIS — R0602 Shortness of breath: Secondary | ICD-10-CM

## 2020-11-20 DIAGNOSIS — I2510 Atherosclerotic heart disease of native coronary artery without angina pectoris: Secondary | ICD-10-CM | POA: Diagnosis not present

## 2020-11-20 DIAGNOSIS — I10 Essential (primary) hypertension: Secondary | ICD-10-CM

## 2020-11-20 DIAGNOSIS — F3342 Major depressive disorder, recurrent, in full remission: Secondary | ICD-10-CM

## 2020-11-20 DIAGNOSIS — J449 Chronic obstructive pulmonary disease, unspecified: Secondary | ICD-10-CM

## 2020-11-20 DIAGNOSIS — E1159 Type 2 diabetes mellitus with other circulatory complications: Secondary | ICD-10-CM

## 2020-11-20 DIAGNOSIS — F411 Generalized anxiety disorder: Secondary | ICD-10-CM

## 2020-11-20 DIAGNOSIS — E7800 Pure hypercholesterolemia, unspecified: Secondary | ICD-10-CM

## 2020-11-20 DIAGNOSIS — I2583 Coronary atherosclerosis due to lipid rich plaque: Secondary | ICD-10-CM

## 2020-11-20 NOTE — Patient Instructions (Signed)
Visit Information  PATIENT GOALS: Goals Addressed            This Visit's Progress   . Protect My Health       Timeframe:  Short-Term Goal Priority:  Medium Progress: On Track Start Date:    11/20/20                         Expected End Date:  02/19/21                     Follow Up Date 12/27/20   Protect My Health (Patient)  Manage depression issues; Manage anxiety issues    Why is this important?    Screening tests can find diseases early when they are easier to treat.   Your doctor or nurse will talk with you about which tests are important for you.   Getting shots for common diseases like the flu and shingles will help prevent them.     Patient Self Care Activities:  . Self administers medications as prescribed . Attends scheduled medical appointment  Patient Coping Strengths:  . Family . Friends  Patient Self Care Deficits:  [name] Depression issues . Anxiety issues  Patient Goals:  - spend time or talk with others at least 2 to 3 times per week - practice relaxation or meditation daily - keep a calendar with appointment dates  Follow Up Plan: [name] to call client on 12/27/20      [name].Zyasia Halbleib MSW, [name] Licensed Clinical Social Worker [facility] Care Management [phone]

## 2020-11-20 NOTE — Chronic Care Management (AMB) (Signed)
Chronic Care Management    Clinical Social Work Note  11/20/2020 Name: [name] MRN: [id number] DOB: [birth year]  [name] is a 65 y.o. year old female who is a primary care patient of [name], [place]. The CCM team was consulted to assist the patient with chronic disease management and/or care coordination needs related to: [name] .   [name] was not able to engage with patient or her daughter by telephone for follow up visit in response to provider referral for social work chronic care management and care coordination services. Phone message was not set up for patient or her daughter and thus [name] could not leave phone message for patient or her daughter  Consent to Services:  The patient was given information about Chronic Care Management services, agreed to services, and gave verbal consent prior to initiation of services.  Please see initial visit note for detailed documentation.   Patient agreed to services and consent obtained.   Assessment: Review of patient past medical history, allergies, medications, and health status, including review of relevant consultants reports was performed today as part of a comprehensive evaluation and provision of chronic care management and care coordination services.     SDOH (Social Determinants of Health) assessments and interventions performed:  SDOH Interventions   Flowsheet Row Most Recent Value  SDOH Interventions   Depression Interventions/Treatment  Medication       Advanced Directives Status: See Vynca application for related entries.  CCM Care Plan  Allergies  Allergen Reactions  . Food Other (See Comments)    Nutmeg= difficulty breathing  . Nutmeg Oil (Myristica Oil) Hives  . Other Other (See Comments)    Nutmeg= difficulty breathing    Outpatient Encounter Medications as of 11/20/2020  Medication Sig  . albuterol (PROVENTIL HFA;VENTOLIN HFA) 108 (90 Base) MCG/ACT inhaler Inhale 2 puffs into the  lungs every 6 (six) hours as needed for wheezing or shortness of breath.  . Alcohol Swabs (B-D SINGLE USE SWABS REGULAR) PADS check blood sugars twice daily Dx E11.9  . ALPRAZolam (XANAX) 1 MG tablet Take 1 tablet (1 mg total) by mouth 3 (three) times daily as needed for anxiety.  [name] amLODipine (NORVASC) 10 MG tablet Take 1 tablet (10 mg total) by mouth daily. (Patient taking differently: Take 10 mg by mouth at bedtime.)  . aspirin 325 MG tablet Take 325 mg by mouth at bedtime.  [name] atorvastatin (LIPITOR) 80 MG tablet Take 1 tablet (80 mg total) by mouth at bedtime.  . Blood Glucose Calibration (TRUE METRIX LEVEL 1) Low SOLN Use with glucose monitor Dx E11.9  . Blood Glucose Monitoring Suppl (TRUE METRIX AIR GLUCOSE METER) w/Device KIT check blood sugars twice daily Dx E11.9  . budesonide-formoterol (SYMBICORT) 160-4.5 MCG/ACT inhaler Inhale 2 puffs into the lungs 2 (two) times daily.  . Cholecalciferol (VITAMIN D3) 5000 units TABS Take 10,000 Units by mouth every evening.  . escitalopram (LEXAPRO) 20 MG tablet Take 1 tablet (20 mg total) by mouth daily.  . fish oil-omega-3 fatty acids 1000 MG capsule Take 1,000 mg by mouth at bedtime.  . furosemide (LASIX) 20 MG tablet TAKE 1 TABLET EVERY DAY AS NEEDED  . glucose blood (TRUE METRIX BLOOD GLUCOSE TEST) test strip Use to check blood sugars twice daily Dx E11.9  . hydrOXYzine (VISTARIL) 25 MG capsule Take 1 capsule by mouth three times daily as needed (Patient taking differently: Take 25 mg by mouth 3 (three) times daily as needed (sinus issues.).)  .  isosorbide mononitrate (IMDUR) 30 MG 24 hr tablet Take 0.5 tablets (15 mg total) by mouth daily. (Patient taking differently: Take 15 mg by mouth at bedtime.)  . lisinopril (ZESTRIL) 40 MG tablet Take 1 tablet (40 mg total) by mouth daily. (Patient taking differently: Take 40 mg by mouth every evening.)  . metFORMIN (GLUCOPHAGE) 500 MG tablet Take 1 tablet (500 mg total) by mouth 2 (two) times daily with a  meal.  . metoprolol tartrate (LOPRESSOR) 25 MG tablet Take 1 tablet (25 mg total) by mouth 2 (two) times daily.  . pramipexole (MIRAPEX) 1 MG tablet Take 1 mg by mouth See admin instructions. Take 1 tablet (1 mg) by mouth scheduled at bedtime, may take an additional tablet during the day if needed for restless leg syndrome  . TRUEplus Lancets 33G MISC Use to check blood sugars twice daily Dx E11.9  . vitamin B-12 (CYANOCOBALAMIN) 1000 MCG tablet Take 1,000 mcg by mouth daily.   No facility-administered encounter medications on file as of 11/20/2020.    Patient Active Problem List   Diagnosis Date Noted  . Depression 04/08/2016  . Morbid obesity ([place]) 12/20/2015  . Type 2 diabetes mellitus ([place]) 04/18/2015  . Restless leg syndrome 03/12/2015  . Vitamin D deficiency 03/12/2015  . Generalized anxiety disorder 02/14/2014  . Malignant neoplasm of upper-inner quadrant of female breast ([place]) 07/30/2011  . Myocardial infarction ([place])   . Angina pectoris ([place]) 05/19/2011  . Hypertension   . Hyperlipidemia   . CAD (coronary artery disease)   . COPD (chronic obstructive pulmonary disease) ([place])   . Carotid stenosis     Conditions to be addressed/monitored: Monitor client management of depression and anxiety  Care Plan : [name] care plan  Updates made by Katha Cabal, [name] since 11/20/2020 12:00 AM    Problem: Emotional Distress     Goal: Emotional Health Supported   Start Date: 11/20/2020  Expected End Date: 02/19/2021  This Visit's Progress: On track  Priority: Medium  Note:   Current Barriers:  . Chronic Mental Health needs related to depression and anxiety . Mobility issues . Suicidal Ideation/Homicidal Ideation: No  Clinical Social Work Goal(s):  . patient will work with SW monthly by telephone or in person to reduce or manage symptoms related to depression and nxiety . patient will work with SW monthly to address concerns related to mobility of client and related to pain  issues of client  Interventions: . 1:1 collaboration with [name], [place] regarding development and update of comprehensive plan of care as evidenced by provider attestation and co-signature . Reviewed chart to assess client needs . Called phone number for client today but [name] was not able to speak via phone with client; phone message was not set up for client and thus [name] could not leave phone message . Called [name], daughter of client, phone number today ; but [name] was not able to speak with [name], client daughter, today; phone message was not set up on her phone and thus [name] could not leave a message for daughter of client  Patient Self Care Activities:  . Self administers medications as prescribed . Attends scheduled medical appointment  Patient Coping Strengths:  . Family . Friends  Patient Self Care Deficits:  [name] Depression issues . Anxiety issues  Patient Goals:  - spend time or talk with others at least 2 to 3 times per week - practice relaxation or meditation daily - keep a calendar with appointment dates  Follow Up Plan: [name] to call client on 12/27/20     [name].Jerin Franzel MSW, [name] Licensed Clinical Social Worker [facility] Care Management [phone]

## 2020-11-21 DIAGNOSIS — B351 Tinea unguium: Secondary | ICD-10-CM | POA: Diagnosis not present

## 2020-11-21 DIAGNOSIS — M79676 Pain in unspecified toe(s): Secondary | ICD-10-CM | POA: Diagnosis not present

## 2020-11-21 DIAGNOSIS — L84 Corns and callosities: Secondary | ICD-10-CM | POA: Diagnosis not present

## 2020-11-21 DIAGNOSIS — E1142 Type 2 diabetes mellitus with diabetic polyneuropathy: Secondary | ICD-10-CM | POA: Diagnosis not present

## 2020-11-25 ENCOUNTER — Other Ambulatory Visit: Payer: Self-pay | Admitting: Nurse Practitioner

## 2020-11-25 DIAGNOSIS — Z1231 Encounter for screening mammogram for malignant neoplasm of breast: Secondary | ICD-10-CM

## 2020-11-30 ENCOUNTER — Other Ambulatory Visit: Payer: Self-pay | Admitting: Nurse Practitioner

## 2020-12-01 NOTE — Progress Notes (Deleted)
Cardiology Office Note:    Date:  12/01/2020   ID:  [name], DOB 1955/09/17, MRN [id number]  PCP:  Bennie Pierini, FNP   [facility]  Cardiologist:  None  Advanced Practice Provider:  No care team member to display Electrophysiologist:  None   Referring MD: [name], [name], *    History of Present Illness:    [name] is a 65 y.o. female with a hx of anxiety, breast cancer, CAD with NSTEMI in 2008, HTN, HLD, DMII, bilateral carotid stenosis, COPD and depression who was referred by [name] for further management of CAD.   Last saw Dr. Excell Seltzer in 06/2011. Patient underwent coronary stenting in 2000 a with a bare-metal stent in the right coronary artery. The patient had a preoperative stress test in 2012 for a lumpectomy. This demonstrated inferolateral ischemia and she was referred for cardiac catheterization, which showed total occlusion of her right coronary artery with left to right collaterals. There was also notation of chronic occlusion of the left circumflex which was present on her previous study in 2008. She had mild-to-moderate disease in the LAD. At her visit in 2012, she had mild anginal symptoms and was managed medically.  During last visit on 09/2020, she felt more fatigued with decreased exercise capacity recently, worsening dyspnea on exertion, and mild chest discomfort at that time across the center of the chest. Symptoms resolved with rest and symptoms do not occur with rest. She underwent LHC on 10/07/20 which showed stable coronary anatomy with CTO of RCA recommended for continued medical management.  Past Medical History:  Diagnosis Date  . Anxiety   . Breast cancer (HCC) 06/30/11   inv ductal, ER/PR +, her-2 -  . CAD (coronary artery disease)    NSTEMI 4/08: OM3 occluded (PCI unsuccessful), dRCA 95% => BMS, inf HK, EF 50%;   b. MV 11/12:  IL ischemia => c.  LHC 11/12:  dLAD 70%, OM1 70-80%, then occluded (no change  from 2008), dOM filled L->L collats, mRCA stent occluded, dRCA filled L->R collats, EF 55-65% => med Rx (consider PCI of RCA if refractory angina)  . Carotid stenosis    dopplers 5/08: 0-30% bilateral; 10/12: 0-39% B/L ICA => f/u 04/2013  . Chronic kidney disease   . COPD (chronic obstructive pulmonary disease) (HCC)   . Depression   . Depression with anxiety   . GERD (gastroesophageal reflux disease)    OTC acid reducer prn  . Headache(784.0)    sinus; occ. migraines  . Hx of radiation therapy 08/24/11 to 10/07/11   L breast  . Hyperlipidemia   . Hypertension    under control; has been on med. x 4 yrs.  . Myocardial infarction (HCC)   . Stable angina (HCC)    a. med Rx after cath 05/2011  . Stress incontinence, female     Past Surgical History:  Procedure Laterality Date  . AXILLARY LYMPH NODE DISSECTION  07/31/2011   Procedure: AXILLARY LYMPH NODE DISSECTION;  Surgeon: [name], MD;  Location: [facility];  Service: General;  Laterality: Left;  left axillary sentinal node biopsy  . BREAST SURGERY    . CARDIAC CATHETERIZATION  11/02/2006; 06/03/2011  . CESAREAN SECTION  1987  . CORONARY STENT PLACEMENT  11/02/2006  . ELBOW SURGERY     right  . LEFT HEART CATH AND CORONARY ANGIOGRAPHY N/A 10/07/2020   Procedure: LEFT HEART CATH AND CORONARY ANGIOGRAPHY;  Surgeon: [name], MD;  Location: [facility];  Service: Cardiovascular;  Laterality: N/A;  . MASTECTOMY PARTIAL / LUMPECTOMY  06/30/2011   left    Current Medications: No outpatient medications have been marked as taking for the 12/03/20 encounter (Appointment) with [name], MD.     Allergies:   Food, Nutmeg oil (myristica oil), and Other   Social History   Socioeconomic History  . Marital status: Single    Spouse name: Not on file  . Number of children: 1  . Years of education: 12+  . Highest education level: Some college, no degree  Occupational History  . Not on file   Tobacco Use  . Smoking status: Current Every Day Smoker    Packs/day: 1.00    Years: 45.00    Pack years: 45.00    Types: Cigarettes  . Smokeless tobacco: Never Used  . Tobacco comment: on and off  Vaping Use  . Vaping Use: Never used  Substance and Sexual Activity  . Alcohol use: Yes    Alcohol/week: 1.0 standard drink    Types: 1 Glasses of wine per week    Comment: occasionally  . Drug use: No  . Sexual activity: Never    Birth control/protection: Post-menopausal  Other Topics Concern  . Not on file  Social History Narrative   Lives at home alone. She has a daughter and grandchildren that she interacts with often.    Social Determinants of Health   Financial Resource Strain: Not on file  Food Insecurity: Not on file  Transportation Needs: Not on file  Physical Activity: Not on file  Stress: Not on file  Social Connections: Not on file     Family History: The patient's family history includes Anxiety disorder in her daughter; Bipolar disorder in her sister; Cancer in her maternal aunt and maternal grandmother; Depression in her daughter; Heart attack in her sister; Heart disease in her father and mother; Thyroid disease in her daughter.  ROS:   Please see the history of present illness.    Review of Systems  Constitutional: Positive for malaise/fatigue. Negative for chills and fever.  HENT: Negative for hearing loss.   Eyes: Negative for blurred vision and redness.  Respiratory: Positive for shortness of breath.   Cardiovascular: Positive for chest pain, orthopnea and leg swelling. Negative for palpitations, claudication and PND.  Gastrointestinal: Negative for melena, nausea and vomiting.  Genitourinary: Negative for hematuria.  Musculoskeletal: Positive for joint pain and myalgias. Negative for falls.  Neurological: Negative for dizziness and loss of consciousness.  Endo/Heme/Allergies: Negative for polydipsia.  Psychiatric/Behavioral: Positive for depression.     EKGs/Labs/Other Studies Reviewed:    The following studies were reviewed today: Cath 10/07/20: 1.  Patent left mainstem 2.  Patent LAD, supplies a large territory with 2 diagonal branches and wraps around the LV apex with no obstructive disease throughout its distribution 3.  Patent left circumflex with chronic occlusion of the first OM branch, late filling is seen in previous cardiac catheterization study 4.  Chronic occlusion of the mid RCA with extensive left-to-right collaterals supplied by an epicardial circumflex branch and septal perforating branches of the LAD 5.  Normal LV function with normal wall motion, no regional wall motion abnormalities.  Recommendations: Favor ongoing medical therapy.  Appears to have stable coronary anatomy from old catheterization 10 years ago.  Will ask CTO specialist to review her films to see if there would be treatment options for refractory angina.  She is already on aggressive medical  therapy with amlodipine, isosorbide, and metoprolol.   Carotid doppler 08/25/18 IMPRESSION: Minor carotid atherosclerosis. No hemodynamically significant ICA stenosis. Degree of narrowing less than 50% bilaterally by ultrasound criteria.  Patent antegrade vertebral flow bilaterally  Cath 2008: NSTEMI 10/2006: pLAD 30%, pCFX 40%, OM3 occluded (PCI unsuccessful), mRCA 50%, dRCA 95% treated with BMS, inf HK, EF 50%.  Cath 2012: FINDINGS:  Aortic pressure is 164/80 with a mean of 116, left  ventricular pressure 165/6 with an end-diastolic pressure of 22.  There  is no aortic stenosis.   The left mainstem is angiographically normal.  It trifurcates into the  LAD, left circumflex and ramus intermedius.  The LAD has mild  nonobstructive luminal irregularity in its proximal portion of no  greater than 30%.  The LAD gives off a large first diagonal branch.  The  mid and distal LAD have minor luminal irregularities, but no significant  angiographic disease.  The  LAD also gives off a medium-size second  diagonal branch.   The left circumflex system is of medium caliber.  The proximal portion  of the left circumflex has a 40% stenosis.  The left circumflex gives  off a small intermediate branch in a second OM territory and there is a  third OM that is subtotally occluded and fills late likely from  antegrade flow.  The true AV groove circumflex has diffuse  nonobstructive disease.   The right coronary artery has a high anterior origin.  The midportion of  the vessel has a 50% stenosis and the distal portion of the vessel has a  95% irregular stenosis.  It gives off a large PDA branch and a small  posterolateral branch.  There is TIMI III flow throughout the vessel.   Left ventriculography performed both in the RAO and LAO projection  demonstrates basal to mid inferior segment of severe hypokinesis.  Other  LV segments are normal.  The left ventricular ejection fraction is 50%.  There is no mitral regurgitation.   ASSESSMENT: 1. Two-vessel coronary artery disease with total occlusion of a small      branch of the left circumflex and high-grade focal stenosis of the      right coronary artery.  2. Mild segmental left ventricular dysfunction.   PLAN:  As detailed above, PCI of the left circumflex branch was  attempted, but I was unable to cross the lesion with a wire.  The right  coronary artery which is likely the patient's culprit vessel was  intervened upon as described with one bare metal stent.  The patient  should continue on aspirin and clopidogrel for 30 days.  Will also place  her on a high-dose statin.  Will consult financial services or the case  manager to help with obtaining medications.   EKG:  EKG is  ordered today.  The ekg ordered today demonstrates NSR with HR 62  Recent Labs: 09/27/2020: ALT 13; BUN 11; Creatinine, Ser 0.87; Hemoglobin 14.4; Platelets 262; Potassium 4.8; Sodium 139  Recent Lipid Panel    Component  Value Date/Time   CHOL 148 09/27/2020 1458   CHOL 169 01/18/2013 1052   TRIG 146 09/27/2020 1458   TRIG 230 (H) 01/18/2013 1052   HDL 42 09/27/2020 1458   HDL 34 (L) 01/18/2013 1052   CHOLHDL 3.5 09/27/2020 1458   CHOLHDL 5.9 CALC 05/16/2007 0854   VLDL 47 (H) 05/16/2007 0854   LDLCALC 80 09/27/2020 1458   LDLCALC 89 01/18/2013 1052   LDLDIRECT 100.0 05/16/2007 0854  Physical Exam:    VS:  LMP 10/17/2005     Wt Readings from Last 3 Encounters:  10/07/20 208 lb (94.3 kg)  09/27/20 213 lb (96.6 kg)  09/24/20 213 lb (96.6 kg)     GEN: Comfortable, NAD HEENT: Normal NECK: No JVD; No carotid bruits CARDIAC: RRR, no murmurs, rubs, gallops RESPIRATORY:  Diminished but clear ABDOMEN: Soft, non-tender, non-distended MUSCULOSKELETAL:  1+ pedal edema bilaterally SKIN: Warm and dry NEUROLOGIC:  Alert and oriented x 3 PSYCHIATRIC:  Normal affect   ASSESSMENT:    No diagnosis found. PLAN:    In order of problems listed above:  #Multivessel CAD: #Chronic angina S/p BMS in 2012 to RCA, known occlusion of OM, mild-to-moderate LAD disease. Presented to clinic with recurrent chest pain similar to her anginal equivalent. She underwent repeat coronary angiography on 10/07/20 which showed stable CAD with chronic CTO of RCA. Recommended for continued medical management -Continue ASA 325mg  daily -Continue atorvastatin 80mg  daily -Continue imdur 30mg  daily -Continue lisinopril 40mg  daily -Change metop to XL 25mg  daily -Continue amlopdipine 10mg  daily -Can add ranxexa 500mg  BID  #HTN: Well controlled -Continue amlodipine 10mg  daily -Continue metop and imdur as above  #HLD: -Continue atorva 80mg  daily  #DMII: -Continue metformin  #COPD: -Continue home inhalers -Management per primary   Medication Adjustments/Labs and Tests Ordered: Current medicines are reviewed at length with the patient today.  Concerns regarding medicines are outlined above.  No orders of the  defined types were placed in this encounter.  No orders of the defined types were placed in this encounter.   There are no Patient Instructions on file for this visit.   Signed, [name], MD  12/01/2020 10:52 AM    [place]

## 2020-12-03 ENCOUNTER — Ambulatory Visit: Payer: Medicare HMO | Admitting: Cardiology

## 2020-12-04 NOTE — Progress Notes (Deleted)
Cardiology Office Note:    Date:  12/04/2020   ID:  [name], DOB Dec 03, 1955, MRN [id number]  PCP:  [name], [place]  Cardiologist:  None  Advanced Practice Provider:  No care team member to display Electrophysiologist:  None   Referring MD: Hassell Done, [name], *    History of Present Illness:    [name] is a 65 y.o. female with a hx of anxiety, breast cancer, CAD with NSTEMI in 2008, HTN, HLD, DMII, bilateral carotid stenosis, COPD and depression who was referred by [name] for further management of CAD.   Last saw Dr. [name] in 06/2011. Patient underwent coronary stenting in 2000 a with a bare-metal stent in the right coronary artery. The patient had a preoperative stress test in 2012 for a lumpectomy. This demonstrated inferolateral ischemia and she was referred for cardiac catheterization, which showed total occlusion of her right coronary artery with left to right collaterals. There was also notation of chronic occlusion of the left circumflex which was present on her previous study in 2008. She had mild-to-moderate disease in the LAD. At her visit in 2012, she had mild anginal symptoms and was managed medically.  During last visit on 09/2020, she felt more fatigued with decreased exercise capacity recently, worsening dyspnea on exertion, and mild chest discomfort at that time across the center of the chest. Symptoms resolved with rest and symptoms do not occur with rest. She underwent LHC on 10/07/20 which showed stable coronary anatomy with CTO of RCA recommended for continued medical management.  Past Medical History:  Diagnosis Date  . Anxiety   . Breast cancer ([place]) 06/30/11   inv ductal, ER/PR +, her-2 -  . CAD (coronary artery disease)    NSTEMI 4/08: OM3 occluded (PCI unsuccessful), dRCA 95% => BMS, inf HK, EF 50%;   b. MV 11/12:  IL ischemia => c.  [place] 11/12:  dLAD 70%, OM1 70-80%, then occluded (no change  from 2008), dOM filled L->L collats, mRCA stent occluded, dRCA filled L->R collats, EF 55-65% => med Rx (consider PCI of RCA if refractory angina)  . Carotid stenosis    dopplers 5/08: 0-30% bilateral; 10/12: 0-39% B/L ICA => f/u 04/2013  . Chronic kidney disease   . COPD (chronic obstructive pulmonary disease) ([place])   . Depression   . Depression with anxiety   . GERD (gastroesophageal reflux disease)    OTC acid reducer prn  . Headache(784.0)    sinus; occ. migraines  . Hx of radiation therapy 08/24/11 to 10/07/11   L breast  . Hyperlipidemia   . Hypertension    under control; has been on med. x 4 yrs.  . Myocardial infarction ([place])   . Stable angina ([place])    a. med Rx after cath 05/2011  . Stress incontinence, female     Past Surgical History:  Procedure Laterality Date  . AXILLARY LYMPH NODE DISSECTION  07/31/2011   Procedure: AXILLARY LYMPH NODE DISSECTION;  Surgeon: [name], MD;  Location: [facility];  Service: General;  Laterality: Left;  left axillary sentinal node biopsy  . BREAST SURGERY    . CARDIAC CATHETERIZATION  11/02/2006; 06/03/2011  . CESAREAN SECTION  1987  . CORONARY STENT PLACEMENT  11/02/2006  . ELBOW SURGERY     right  . LEFT HEART CATH AND CORONARY ANGIOGRAPHY N/A 10/07/2020   Procedure: LEFT HEART CATH AND CORONARY ANGIOGRAPHY;  Surgeon: [name], MD;  Location: [facility];  Service: Cardiovascular;  Laterality: N/A;  . MASTECTOMY PARTIAL / LUMPECTOMY  06/30/2011   left    Current Medications: No outpatient medications have been marked as taking for the 12/05/20 encounter (Appointment) with [name], MD.     Allergies:   Food, Nutmeg oil (myristica oil), and Other   Social History   Socioeconomic History  . Marital status: Single    Spouse name: Not on file  . Number of children: 1  . Years of education: 12+  . Highest education level: Some college, no degree  Occupational History  . Not on file   Tobacco Use  . Smoking status: Current Every Day Smoker    Packs/day: 1.00    Years: 45.00    Pack years: 45.00    Types: Cigarettes  . Smokeless tobacco: Never Used  . Tobacco comment: on and off  Vaping Use  . Vaping Use: Never used  Substance and Sexual Activity  . Alcohol use: Yes    Alcohol/week: 1.0 standard drink    Types: 1 Glasses of wine per week    Comment: occasionally  . Drug use: No  . Sexual activity: Never    Birth control/protection: Post-menopausal  Other Topics Concern  . Not on file  Social History Narrative   Lives at home alone. She has a daughter and grandchildren that she interacts with often.    Social Determinants of Health   Financial Resource Strain: Not on file  Food Insecurity: Not on file  Transportation Needs: Not on file  Physical Activity: Not on file  Stress: Not on file  Social Connections: Not on file     Family History: The patient's family history includes Anxiety disorder in her daughter; Bipolar disorder in her sister; Cancer in her maternal aunt and maternal grandmother; Depression in her daughter; Heart attack in her sister; Heart disease in her father and mother; Thyroid disease in her daughter.  ROS:   Please see the history of present illness.    Review of Systems  Constitutional: Positive for malaise/fatigue. Negative for chills and fever.  HENT: Negative for hearing loss.   Eyes: Negative for blurred vision and redness.  Respiratory: Positive for shortness of breath.   Cardiovascular: Positive for chest pain, orthopnea and leg swelling. Negative for palpitations, claudication and PND.  Gastrointestinal: Negative for melena, nausea and vomiting.  Genitourinary: Negative for hematuria.  Musculoskeletal: Positive for joint pain and myalgias. Negative for falls.  Neurological: Negative for dizziness and loss of consciousness.  Endo/Heme/Allergies: Negative for polydipsia.  Psychiatric/Behavioral: Positive for depression.     EKGs/Labs/Other Studies Reviewed:    The following studies were reviewed today: Cath 10/07/20: 1.  Patent left mainstem 2.  Patent LAD, supplies a large territory with 2 diagonal branches and wraps around the LV apex with no obstructive disease throughout its distribution 3.  Patent left circumflex with chronic occlusion of the first OM branch, late filling is seen in previous cardiac catheterization study 4.  Chronic occlusion of the mid RCA with extensive left-to-right collaterals supplied by an epicardial circumflex branch and septal perforating branches of the LAD 5.  Normal LV function with normal wall motion, no regional wall motion abnormalities.  Recommendations: Favor ongoing medical therapy.  Appears to have stable coronary anatomy from old catheterization 10 years ago.  Will ask CTO specialist to review her films to see if there would be treatment options for refractory angina.  She is already on aggressive medical  therapy with amlodipine, isosorbide, and metoprolol.   Carotid doppler 08/25/18 IMPRESSION: Minor carotid atherosclerosis. No hemodynamically significant ICA stenosis. Degree of narrowing less than 50% bilaterally by ultrasound criteria.  Patent antegrade vertebral flow bilaterally  Cath 2008: NSTEMI 10/2006: pLAD 30%, pCFX 40%, OM3 occluded (PCI unsuccessful), mRCA 50%, dRCA 95% treated with BMS, inf HK, EF 50%.  Cath 2012: FINDINGS:  Aortic pressure is 164/80 with a mean of 116, left  ventricular pressure 165/6 with an end-diastolic pressure of 22.  There  is no aortic stenosis.   The left mainstem is angiographically normal.  It trifurcates into the  LAD, left circumflex and ramus intermedius.  The LAD has mild  nonobstructive luminal irregularity in its proximal portion of no  greater than 30%.  The LAD gives off a large first diagonal branch.  The  mid and distal LAD have minor luminal irregularities, but no significant  angiographic disease.  The  LAD also gives off a medium-size second  diagonal branch.   The left circumflex system is of medium caliber.  The proximal portion  of the left circumflex has a 40% stenosis.  The left circumflex gives  off a small intermediate branch in a second OM territory and there is a  third OM that is subtotally occluded and fills late likely from  antegrade flow.  The true AV groove circumflex has diffuse  nonobstructive disease.   The right coronary artery has a high anterior origin.  The midportion of  the vessel has a 50% stenosis and the distal portion of the vessel has a  95% irregular stenosis.  It gives off a large PDA branch and a small  posterolateral branch.  There is TIMI III flow throughout the vessel.   Left ventriculography performed both in the RAO and LAO projection  demonstrates basal to mid inferior segment of severe hypokinesis.  Other  LV segments are normal.  The left ventricular ejection fraction is 50%.  There is no mitral regurgitation.   ASSESSMENT: 1. Two-vessel coronary artery disease with total occlusion of a small      branch of the left circumflex and high-grade focal stenosis of the      right coronary artery.  2. Mild segmental left ventricular dysfunction.   PLAN:  As detailed above, PCI of the left circumflex branch was  attempted, but I was unable to cross the lesion with a wire.  The right  coronary artery which is likely the patient's culprit vessel was  intervened upon as described with one bare metal stent.  The patient  should continue on aspirin and clopidogrel for 30 days.  Will also place  her on a high-dose statin.  Will consult financial services or the case  manager to help with obtaining medications.   EKG:  EKG is  ordered today.  The ekg ordered today demonstrates NSR with HR 62  Recent Labs: 09/27/2020: ALT 13; BUN 11; Creatinine, Ser 0.87; Hemoglobin 14.4; Platelets 262; Potassium 4.8; Sodium 139  Recent Lipid Panel    Component  Value Date/Time   CHOL 148 09/27/2020 1458   CHOL 169 01/18/2013 1052   TRIG 146 09/27/2020 1458   TRIG 230 (H) 01/18/2013 1052   HDL 42 09/27/2020 1458   HDL 34 (L) 01/18/2013 1052   CHOLHDL 3.5 09/27/2020 1458   CHOLHDL 5.9 CALC 05/16/2007 0854   VLDL 47 (H) 05/16/2007 0854   LDLCALC 80 09/27/2020 1458   LDLCALC 89 01/18/2013 1052   LDLDIRECT 100.0 05/16/2007 0854  Physical Exam:    VS:  LMP 10/17/2005     Wt Readings from Last 3 Encounters:  10/07/20 208 lb (94.3 kg)  09/27/20 213 lb (96.6 kg)  09/24/20 213 lb (96.6 kg)     GEN: Comfortable, NAD HEENT: Normal NECK: No JVD; No carotid bruits CARDIAC: RRR, no murmurs, rubs, gallops RESPIRATORY:  Diminished but clear ABDOMEN: Soft, non-tender, non-distended MUSCULOSKELETAL:  1+ pedal edema bilaterally SKIN: Warm and dry NEUROLOGIC:  Alert and oriented x 3 PSYCHIATRIC:  Normal affect   ASSESSMENT:    No diagnosis found. PLAN:    In order of problems listed above:  #Multivessel CAD: #Chronic angina S/p BMS in 2012 to RCA, known occlusion of OM, mild-to-moderate LAD disease. Presented to clinic with recurrent chest pain similar to her anginal equivalent. She underwent repeat coronary angiography on 10/07/20 which showed stable CAD with chronic CTO of RCA. Recommended for continued medical management. -Check TTE -Change to ASA 66m daily -Continue atorvastatin 833mdaily -Continue imdur 3031maily -Continue lisinopril 65m65mily -Change metop to XL 25mg63mly -Continue amlopdipine 10mg 90my  #HTN: Well controlled -Continue amlodipine 10mg d4m -Continue metop and imdur as above  #HLD: LDL 86 in 06/2020. Goal<70 -Continue atorva 80mg da84m #DMII: -Continue metformin 500mg BID25mOPD: -Continue home inhalers -Management per primary   Medication Adjustments/Labs and Tests Ordered: Current medicines are reviewed at length with the patient today.  Concerns regarding medicines are outlined above.   No orders of the defined types were placed in this encounter.  No orders of the defined types were placed in this encounter.   There are no Patient Instructions on file for this visit.   Signed, Derrich Gaby EFreada Bergeron1/2022 7:16 PM    [facility]

## 2020-12-05 ENCOUNTER — Ambulatory Visit (INDEPENDENT_AMBULATORY_CARE_PROVIDER_SITE_OTHER): Payer: Medicare HMO | Admitting: Cardiology

## 2020-12-05 ENCOUNTER — Other Ambulatory Visit: Payer: Self-pay

## 2020-12-05 ENCOUNTER — Encounter: Payer: Self-pay | Admitting: Cardiology

## 2020-12-05 VITALS — BP 124/76 | HR 64 | Ht 63.0 in | Wt 219.4 lb

## 2020-12-05 DIAGNOSIS — I10 Essential (primary) hypertension: Secondary | ICD-10-CM

## 2020-12-05 DIAGNOSIS — Z79899 Other long term (current) drug therapy: Secondary | ICD-10-CM | POA: Diagnosis not present

## 2020-12-05 DIAGNOSIS — E782 Mixed hyperlipidemia: Secondary | ICD-10-CM | POA: Diagnosis not present

## 2020-12-05 DIAGNOSIS — I25118 Atherosclerotic heart disease of native coronary artery with other forms of angina pectoris: Secondary | ICD-10-CM

## 2020-12-05 MED ORDER — METOPROLOL SUCCINATE ER 25 MG PO TB24: 25.0000 mg | ORAL_TABLET | Freq: Every day | ORAL | 1 refills | Status: DC

## 2020-12-05 MED ORDER — ASPIRIN EC 81 MG PO TBEC: 81.0000 mg | DELAYED_RELEASE_TABLET | Freq: Every day | ORAL | 3 refills | Status: DC

## 2020-12-05 NOTE — Patient Instructions (Signed)
Medication Instructions:   STOP TAKING METOPROLOL TARTRATE NOW  START TAKING METOPROLOL SUCCINATE (TOPROL XL) 25 MG BY MOUTH DAILY  DECREASE YOUR ASPIRIN TO 81 MG BY MOUTH DAILY  *If you need a refill on your cardiac medications before your next appointment, please call your pharmacy*   Lab Work:  ONE WEEK--BMET AND LIPIDS--PLEASE COME FASTING TO THIS LAB APPOINTMENT  If you have labs (blood work) drawn today and your tests are completely normal, you will receive your results only by: [name] MyChart Message (if you have MyChart) OR . A paper copy in the mail If you have any lab test that is abnormal or we need to change your treatment, we will call you to review the results.   Testing/Procedures:  Your physician has requested that you have an echocardiogram. Echocardiography is a painless test that uses sound waves to create images of your heart. It provides your doctor with information about the size and shape of your heart and how well your heart's chambers and valves are working. This procedure takes approximately one hour. There are no restrictions for this procedure.   Follow-Up:  WITH DR. Johney Frame IN LATE July OR [place]

## 2020-12-05 NOTE — Progress Notes (Signed)
Cardiology Office Note:    Date:  12/05/2020   ID:  [name], DOB Jan 13, 1956, MRN [id number]  PCP:  [name], [place]  Cardiologist:  None  Advanced Practice Provider:  No care team member to display Electrophysiologist:  None   Referring MD: Hassell Done, [name], *    History of Present Illness:    [name] is a 65 y.o. female with a hx of anxiety, breast cancer, CAD with NSTEMI in 2008, HTN, HLD, DMII, bilateral carotid stenosis, COPD and depression who was referred by [name] for further management of CAD.   Last saw Dr. [name] in 06/2011. Patient underwent coronary stenting in 2000 a with a bare-metal stent in the right coronary artery. The patient had a preoperative stress test in 2012 for a lumpectomy. This demonstrated inferolateral ischemia and she was referred for cardiac catheterization, which showed total occlusion of her right coronary artery with left to right collaterals. There was also notation of chronic occlusion of the left circumflex which was present on her previous study in 2008. She had mild-to-moderate disease in the LAD. At her visit in 2012, she had mild anginal symptoms and was managed medically.  During last visit on 09/2020, she felt more fatigued with decreased exercise capacity recently, worsening dyspnea on exertion, and mild chest discomfort at that time across the center of the chest. Symptoms resolved with rest and symptoms do not occur with rest. She underwent LHC on 10/07/20 which showed stable coronary anatomy with CTO of RCA recommended for continued medical management.  Today, she is feeling okay. She states she is having more fatigue and feels like she is suffering with depression. After her procedure, she was having cramps along her central abdomen that has since resolved. She has also developed back pain from this that she has currently. She is planning to see her PCP for further  management.  Otherwise, she has continued shortness of breath that is intermittent, and is unchanged since her last visit. She tried Symbicort for about a week, but did not notice any improvement in her breathing. She is now back to using the brio inhaler.   She has LE edema (L>R), and is unsure of direct causes but believes it is linked to her diet. If she wakes up and notices her feet are swollen, she will take Lasix. At home, her blood pressure is well controlled. She is taking all medications as prescribed.   She denies any chest pain or palpitations. No pre-syncope, syncope, or lightheadedness to note.   Past Medical History:  Diagnosis Date  . Anxiety   . Breast cancer ([place]) 06/30/11   inv ductal, ER/PR +, her-2 -  . CAD (coronary artery disease)    NSTEMI 4/08: OM3 occluded (PCI unsuccessful), dRCA 95% => BMS, inf HK, EF 50%;   b. MV 11/12:  IL ischemia => c.  [place] 11/12:  dLAD 70%, OM1 70-80%, then occluded (no change from 2008), dOM filled L->L collats, mRCA stent occluded, dRCA filled L->R collats, EF 55-65% => med Rx (consider PCI of RCA if refractory angina)  . Carotid stenosis    dopplers 5/08: 0-30% bilateral; 10/12: 0-39% B/L ICA => f/u 04/2013  . Chronic kidney disease   . COPD (chronic obstructive pulmonary disease) ([place])   . Depression   . Depression with anxiety   . GERD (gastroesophageal reflux disease)    OTC acid reducer prn  . Headache(784.0)    sinus; occ.  migraines  . Hx of radiation therapy 08/24/11 to 10/07/11   L breast  . Hyperlipidemia   . Hypertension    under control; has been on med. x 4 yrs.  . Myocardial infarction ([place])   . Stable angina ([place])    a. med Rx after cath 05/2011  . Stress incontinence, female     Past Surgical History:  Procedure Laterality Date  . AXILLARY LYMPH NODE DISSECTION  07/31/2011   Procedure: AXILLARY LYMPH NODE DISSECTION;  Surgeon: [name], MD;  Location: [facility];  Service: General;  Laterality:  Left;  left axillary sentinal node biopsy  . BREAST SURGERY    . CARDIAC CATHETERIZATION  11/02/2006; 06/03/2011  . CESAREAN SECTION  1987  . CORONARY STENT PLACEMENT  11/02/2006  . ELBOW SURGERY     right  . LEFT HEART CATH AND CORONARY ANGIOGRAPHY N/A 10/07/2020   Procedure: LEFT HEART CATH AND CORONARY ANGIOGRAPHY;  Surgeon: [name], MD;  Location: [facility];  Service: Cardiovascular;  Laterality: N/A;  . MASTECTOMY PARTIAL / LUMPECTOMY  06/30/2011   left    Current Medications: Current Meds  Medication Sig  . albuterol (PROVENTIL HFA;VENTOLIN HFA) 108 (90 Base) MCG/ACT inhaler Inhale 2 puffs into the lungs every 6 (six) hours as needed for wheezing or shortness of breath.  . Alcohol Swabs (B-D SINGLE USE SWABS REGULAR) PADS check blood sugars twice daily Dx E11.9  . ALPRAZolam (XANAX) 1 MG tablet Take 1 tablet (1 mg total) by mouth 3 (three) times daily as needed for anxiety.  [name] amLODipine (NORVASC) 10 MG tablet Take 1 tablet (10 mg total) by mouth daily.  [name] aspirin EC 81 MG tablet Take 1 tablet (81 mg total) by mouth daily. Swallow whole.  [name] atorvastatin (LIPITOR) 80 MG tablet Take 1 tablet (80 mg total) by mouth at bedtime.  . Blood Glucose Calibration (TRUE METRIX LEVEL 1) Low SOLN Use with glucose monitor Dx E11.9  . Blood Glucose Monitoring Suppl (TRUE METRIX AIR GLUCOSE METER) w/Device KIT check blood sugars twice daily Dx E11.9  . budesonide-formoterol (SYMBICORT) 160-4.5 MCG/ACT inhaler Inhale 2 puffs into the lungs 2 (two) times daily.  . Cholecalciferol (VITAMIN D3) 5000 units TABS Take 10,000 Units by mouth every evening.  . escitalopram (LEXAPRO) 20 MG tablet Take 1 tablet (20 mg total) by mouth daily.  . fish oil-omega-3 fatty acids 1000 MG capsule Take 1,000 mg by mouth at bedtime.  . furosemide (LASIX) 20 MG tablet TAKE 1 TABLET EVERY DAY AS NEEDED  . glucose blood (TRUE METRIX BLOOD GLUCOSE TEST) test strip Use to check blood sugars twice daily Dx E11.9  .  hydrOXYzine (VISTARIL) 25 MG capsule Take 1 capsule by mouth three times daily as needed  . isosorbide mononitrate (IMDUR) 30 MG 24 hr tablet Take 0.5 tablets (15 mg total) by mouth daily.  [name] lisinopril (ZESTRIL) 40 MG tablet Take 1 tablet (40 mg total) by mouth daily.  . metFORMIN (GLUCOPHAGE) 500 MG tablet Take 1 tablet (500 mg total) by mouth 2 (two) times daily with a meal.  . metoprolol succinate (TOPROL XL) 25 MG 24 hr tablet Take 1 tablet (25 mg total) by mouth daily.  . pramipexole (MIRAPEX) 1 MG tablet Take 1 mg by mouth in the morning and at bedtime.  . TRUEplus Lancets 33G MISC Use to check blood sugars twice daily Dx E11.9  . vitamin B-12 (CYANOCOBALAMIN) 1000 MCG tablet Take 1,000 mcg by mouth daily.  . [  DISCONTINUED] aspirin 325 MG tablet Take 325 mg by mouth at bedtime.  . [DISCONTINUED] metoprolol tartrate (LOPRESSOR) 25 MG tablet Take 1 tablet (25 mg total) by mouth 2 (two) times daily.     Allergies:   Food, Nutmeg oil (myristica oil), and Other   Social History   Socioeconomic History  . Marital status: Single    Spouse name: Not on file  . Number of children: 1  . Years of education: 12+  . Highest education level: Some college, no degree  Occupational History  . Not on file  Tobacco Use  . Smoking status: Current Every Day Smoker    Packs/day: 1.00    Years: 45.00    Pack years: 45.00    Types: Cigarettes  . Smokeless tobacco: Never Used  . Tobacco comment: on and off  Vaping Use  . Vaping Use: Never used  Substance and Sexual Activity  . Alcohol use: Yes    Alcohol/week: 1.0 standard drink    Types: 1 Glasses of wine per week    Comment: occasionally  . Drug use: No  . Sexual activity: Never    Birth control/protection: Post-menopausal  Other Topics Concern  . Not on file  Social History Narrative   Lives at home alone. She has a daughter and grandchildren that she interacts with often.    Social Determinants of Health   Financial Resource  Strain: Not on file  Food Insecurity: Not on file  Transportation Needs: Not on file  Physical Activity: Not on file  Stress: Not on file  Social Connections: Not on file     Family History: The patient's family history includes Anxiety disorder in her daughter; Bipolar disorder in her sister; Cancer in her maternal aunt and maternal grandmother; Depression in her daughter; Heart attack in her sister; Heart disease in her father and mother; Thyroid disease in her daughter.  ROS:   Please see the history of present illness.    Review of Systems  Constitutional: Positive for malaise/fatigue. Negative for chills and fever.  HENT: Negative for hearing loss.   Eyes: Negative for blurred vision and redness.  Respiratory: Positive for shortness of breath.   Cardiovascular: Positive for orthopnea and leg swelling. Negative for chest pain, palpitations, claudication and PND.  Gastrointestinal: Negative for melena, nausea and vomiting.  Genitourinary: Negative for hematuria.  Musculoskeletal: Positive for back pain, joint pain and myalgias. Negative for falls.  Neurological: Negative for dizziness and loss of consciousness.  Endo/Heme/Allergies: Negative for polydipsia.  Psychiatric/Behavioral: Positive for depression.    EKGs/Labs/Other Studies Reviewed:    The following studies were reviewed today: Cath 10/07/20: 1.  Patent left mainstem 2.  Patent LAD, supplies a large territory with 2 diagonal branches and wraps around the LV apex with no obstructive disease throughout its distribution 3.  Patent left circumflex with chronic occlusion of the first OM branch, late filling is seen in previous cardiac catheterization study 4.  Chronic occlusion of the mid RCA with extensive left-to-right collaterals supplied by an epicardial circumflex branch and septal perforating branches of the LAD 5.  Normal LV function with normal wall motion, no regional wall motion abnormalities.  Recommendations:  Favor ongoing medical therapy.  Appears to have stable coronary anatomy from old catheterization 10 years ago.  Will ask CTO specialist to review her films to see if there would be treatment options for refractory angina.  She is already on aggressive medical therapy with amlodipine, isosorbide, and metoprolol.   Carotid doppler  08/25/18 IMPRESSION: Minor carotid atherosclerosis. No hemodynamically significant ICA stenosis. Degree of narrowing less than 50% bilaterally by ultrasound criteria.  Patent antegrade vertebral flow bilaterally  Cath 2008: NSTEMI 10/2006: pLAD 30%, pCFX 40%, OM3 occluded (PCI unsuccessful), mRCA 50%, dRCA 95% treated with BMS, inf HK, EF 50%.  Cath 2012: FINDINGS:  Aortic pressure is 164/80 with a mean of 116, left  ventricular pressure 165/6 with an end-diastolic pressure of 22.  There  is no aortic stenosis.   The left mainstem is angiographically normal.  It trifurcates into the  LAD, left circumflex and ramus intermedius.  The LAD has mild  nonobstructive luminal irregularity in its proximal portion of no  greater than 30%.  The LAD gives off a large first diagonal branch.  The  mid and distal LAD have minor luminal irregularities, but no significant  angiographic disease.  The LAD also gives off a medium-size second  diagonal branch.   The left circumflex system is of medium caliber.  The proximal portion  of the left circumflex has a 40% stenosis.  The left circumflex gives  off a small intermediate branch in a second OM territory and there is a  third OM that is subtotally occluded and fills late likely from  antegrade flow.  The true AV groove circumflex has diffuse  nonobstructive disease.   The right coronary artery has a high anterior origin.  The midportion of  the vessel has a 50% stenosis and the distal portion of the vessel has a  95% irregular stenosis.  It gives off a large PDA branch and a small  posterolateral branch.  There is TIMI  III flow throughout the vessel.   Left ventriculography performed both in the RAO and LAO projection  demonstrates basal to mid inferior segment of severe hypokinesis.  Other  LV segments are normal.  The left ventricular ejection fraction is 50%.  There is no mitral regurgitation.   ASSESSMENT: 1. Two-vessel coronary artery disease with total occlusion of a small      branch of the left circumflex and high-grade focal stenosis of the      right coronary artery.  2. Mild segmental left ventricular dysfunction.   PLAN:  As detailed above, PCI of the left circumflex branch was  attempted, but I was unable to cross the lesion with a wire.  The right  coronary artery which is likely the patient's culprit vessel was  intervened upon as described with one bare metal stent.  The patient  should continue on aspirin and clopidogrel for 30 days.  Will also place  her on a high-dose statin.  Will consult financial services or the case  manager to help with obtaining medications.   EKG:   12/05/2020: NSR, anterior infarct   09/24/2020: NSR with HR 62  Recent Labs: 09/27/2020: ALT 13; BUN 11; Creatinine, Ser 0.87; Hemoglobin 14.4; Platelets 262; Potassium 4.8; Sodium 139  Recent Lipid Panel    Component Value Date/Time   CHOL 148 09/27/2020 1458   CHOL 169 01/18/2013 1052   TRIG 146 09/27/2020 1458   TRIG 230 (H) 01/18/2013 1052   HDL 42 09/27/2020 1458   HDL 34 (L) 01/18/2013 1052   CHOLHDL 3.5 09/27/2020 1458   CHOLHDL 5.9 CALC 05/16/2007 0854   VLDL 47 (H) 05/16/2007 0854   LDLCALC 80 09/27/2020 1458   LDLCALC 89 01/18/2013 1052   LDLDIRECT 100.0 05/16/2007 0854     Physical Exam:    VS:  BP 124/76  Pulse 64   Ht _0  (1.6 m)   Wt 219 lb 6.4 oz (99.5 kg)   LMP 10/17/2005   SpO2 95%   BMI 38.86 kg/m     Wt Readings from Last 3 Encounters:  12/05/20 219 lb 6.4 oz (99.5 kg)  10/07/20 208 lb (94.3 kg)  09/27/20 213 lb (96.6 kg)     GEN: Comfortable, elderly female,  NAD HEENT: Normal NECK: No JVD; No carotid bruits CARDIAC: RRR, no murmurs, rubs, gallops RESPIRATORY:  Diminished but clear, no wheezing ABDOMEN: Soft, non-tender, non-distended MUSCULOSKELETAL:  Trace pedal edema bilaterally (L>R) SKIN: Warm and dry NEUROLOGIC:  Alert and oriented x 3 PSYCHIATRIC:  Normal affect   ASSESSMENT:    1. Coronary artery disease of native artery of native heart with stable angina pectoris ([place])   2. Mixed hyperlipidemia   3. Primary hypertension   4. Medication management    PLAN:    In order of problems listed above:  #Multivessel CAD: #Chronic angina S/p BMS in 2012 to RCA, known occlusion of OM, mild-to-moderate LAD disease. Presented to clinic in 09/2020 with recurrent chest pain similar to her anginal equivalent. She underwent repeat coronary angiography on 10/07/20 which showed stable CAD with chronic CTO of RCA and OM with collateral flow and no new lesions. Recommended for continued medical management. Currently, has chronic shortness of breath but no chest pain.  -Check TTE -Change to ASA 72m daily  -Continue atorvastatin 884mdaily -Continue imdur 3047maily -Continue lisinopril 72m57mily -Change metop to XL 25mg83mly -Continue amlopdipine 10mg 57my  #HTN: Well controlled and at goal. -Continue amlodipine 10mg d54m -Continue imdur 30mg da39m-Continue lisinopril 72mg dai35mChange metop to XL 25mg dail65mHLD: LDL 86 in 06/2020. Goal<70 -Continue atorva 80mg daily67m elevated on repeat lipids next week, will add zetia 10mg daily 75mII: -Continue metformin 500mg BID  #C69m -Continue home inhalers -Management per primary  Follow-up in August.  Medication Adjustments/Labs and Tests Ordered: Current medicines are reviewed at length with the patient today.  Concerns regarding medicines are outlined above.  Orders Placed This Encounter  Procedures  . Lipid Profile  . Basic metabolic panel  . EKG 12-Lead  .  ECHOCARDIOGRAM COMPLETE   Meds ordered this encounter  Medications  . metoprolol succinate (TOPROL XL) 25 MG 24 hr tablet    Sig: Take 1 tablet (25 mg total) by mouth daily.    Dispense:  90 tablet    Refill:  1    D/C'ed tartrate and switched to this  . aspirin EC 81 MG tablet    Sig: Take 1 tablet (81 mg total) by mouth daily. Swallow whole.    Dispense:  90 tablet    Refill:  3    Patient Instructions  Medication Instructions:   STOP TAKING METOPROLOL TARTRATE NOW  START TAKING METOPROLOL SUCCINATE (TOPROL XL) 25 MG BY MOUTH DAILY  DECREASE YOUR ASPIRIN TO 81 MG BY MOUTH DAILY  *If you need a refill on your cardiac medications before your next appointment, please call your pharmacy*   Lab Work:  ONE WEEK--BMET AND LIPIDS--PLEASE COME FASTING TO THIS LAB APPOINTMENT  If you have labs (blood work) drawn today and your tests are completely normal, you will receive your results only by: . MyChart MesMarland Kitchenage (if you have MyChart) OR . A paper copy in the mail If you have any lab test that is abnormal or we need to change your treatment, we will call you  to review the results.   Testing/Procedures:  Your physician has requested that you have an echocardiogram. Echocardiography is a painless test that uses sound waves to create images of your heart. It provides your doctor with information about the size and shape of your heart and how well your heart's chambers and valves are working. This procedure takes approximately one hour. There are no restrictions for this procedure.   Follow-Up:  WITH DR. Johney Frame IN LATE July OR AUGUST       I,Mathew Stumpf,acting as a Education administrator for [name], MD.,have documented all relevant documentation on the behalf of [name], MD,as directed by  [name], MD while in the presence of [name], MD.  I, [name], MD, have reviewed all documentation for this visit. The documentation on  12/05/20 for the exam, diagnosis, procedures, and orders are all accurate and complete.  Signed, [name], MD  12/05/2020 2:31 PM    [facility]

## 2020-12-11 ENCOUNTER — Ambulatory Visit (INDEPENDENT_AMBULATORY_CARE_PROVIDER_SITE_OTHER): Payer: Medicare HMO

## 2020-12-11 VITALS — Ht 63.0 in | Wt 219.0 lb

## 2020-12-11 DIAGNOSIS — F17210 Nicotine dependence, cigarettes, uncomplicated: Secondary | ICD-10-CM

## 2020-12-11 DIAGNOSIS — Z0001 Encounter for general adult medical examination with abnormal findings: Secondary | ICD-10-CM

## 2020-12-11 DIAGNOSIS — Z599 Problem related to housing and economic circumstances, unspecified: Secondary | ICD-10-CM

## 2020-12-11 DIAGNOSIS — Z0000 Encounter for general adult medical examination without abnormal findings: Secondary | ICD-10-CM

## 2020-12-11 NOTE — Patient Instructions (Addendum)
[name] , Thank you for taking time to come for your Medicare Wellness Visit. I appreciate your ongoing commitment to your health goals. Please review the following plan we discussed and let me know if I can assist you in the future.   Screening recommendations/referrals: Colonoscopy: Did cologuard a few weeks ago - waiting for results Mammogram: Has appointment 5/25 Bone Density: Due at age 64 Recommended yearly ophthalmology/optometry visit for glaucoma screening and checkup Recommended yearly dental visit for hygiene and checkup  Vaccinations: Declines all vaccines Influenza vaccine: * Pneumococcal vaccine: * Tdap vaccine: * Shingles vaccine: *  Covid-19: *  Advanced directives: Please bring a copy of your health care power of attorney and living will to the office to be added to your chart at your convenience.  Conditions/risks identified: Aim for 30 minutes of exercise or brisk walking each day, drink 6-8 glasses of water and eat lots of fruits and vegetables.  Next appointment: Follow up in one year for your annual wellness visit.   Preventive Care 40-64 Years, Female Preventive care refers to lifestyle choices and visits with your health care provider that can promote health and wellness. What does preventive care include?  A yearly physical exam. This is also called an annual well check.  Dental exams once or twice a year.  Routine eye exams. Ask your health care provider how often you should have your eyes checked.  Personal lifestyle choices, including:  Daily care of your teeth and gums.  Regular physical activity.  Eating a healthy diet.  Avoiding tobacco and drug use.  Limiting alcohol use.  Practicing safe sex.  Taking low-dose aspirin daily starting at age 64.  Taking vitamin and mineral supplements as recommended by your health care provider. What happens during an annual well check? The services and screenings done by your health care provider  during your annual well check will depend on your age, overall health, lifestyle risk factors, and family history of disease. Counseling  Your health care provider may ask you questions about your:  Alcohol use.  Tobacco use.  Drug use.  Emotional well-being.  Home and relationship well-being.  Sexual activity.  Eating habits.  Work and work Statistician.  Method of birth control.  Menstrual cycle.  Pregnancy history. Screening  You may have the following tests or measurements:  Height, weight, and BMI.  Blood pressure.  Lipid and cholesterol levels. These may be checked every 5 years, or more frequently if you are over 30 years old.  Skin check.  Lung cancer screening. You may have this screening every year starting at age 31 if you have a 30-pack-year history of smoking and currently smoke or have quit within the past 15 years.  Fecal occult blood test (FOBT) of the stool. You may have this test every year starting at age 20.  Flexible sigmoidoscopy or colonoscopy. You may have a sigmoidoscopy every 5 years or a colonoscopy every 10 years starting at age 12.  Hepatitis C blood test.  Hepatitis B blood test.  Sexually transmitted disease (STD) testing.  Diabetes screening. This is done by checking your blood sugar (glucose) after you have not eaten for a while (fasting). You may have this done every 1-3 years.  Mammogram. This may be done every 1-2 years. Talk to your health care provider about when you should start having regular mammograms. This may depend on whether you have a family history of breast cancer.  BRCA-related cancer screening. This may be done if you  have a family history of breast, ovarian, tubal, or peritoneal cancers.  Pelvic exam and Pap test. This may be done every 3 years starting at age 64. Starting at age 42, this may be done every 5 years if you have a Pap test in combination with an HPV test.  Bone density scan. This is done to screen  for osteoporosis. You may have this scan if you are at high risk for osteoporosis. Discuss your test results, treatment options, and if necessary, the need for more tests with your health care provider. Vaccines  Your health care provider may recommend certain vaccines, such as:  Influenza vaccine. This is recommended every year.  Tetanus, diphtheria, and acellular pertussis (Tdap, Td) vaccine. You may need a Td booster every 10 years.  Zoster vaccine. You may need this after age 64.  Pneumococcal 13-valent conjugate (PCV13) vaccine. You may need this if you have certain conditions and were not previously vaccinated.  Pneumococcal polysaccharide (PPSV23) vaccine. You may need one or two doses if you smoke cigarettes or if you have certain conditions. Talk to your health care provider about which screenings and vaccines you need and how often you need them. This information is not intended to replace advice given to you by your health care provider. Make sure you discuss any questions you have with your health care provider. Document Released: 08/09/2015 Document Revised: 04/01/2016 Document Reviewed: 05/14/2015 Elsevier Interactive Patient Education  2017 [place] Prevention in the Home Falls can cause injuries. They can happen to people of all ages. There are many things you can do to make your home safe and to help prevent falls. What can I do on the outside of my home?  Regularly fix the edges of walkways and driveways and fix any cracks.  Remove anything that might make you trip as you walk through a door, such as a raised step or threshold.  Trim any bushes or trees on the path to your home.  Use bright outdoor lighting.  Clear any walking paths of anything that might make someone trip, such as rocks or tools.  Regularly check to see if handrails are loose or broken. Make sure that both sides of any steps have handrails.  Any raised decks and porches should  have guardrails on the edges.  Have any leaves, snow, or ice cleared regularly.  Use sand or salt on walking paths during winter.  Clean up any spills in your garage right away. This includes oil or grease spills. What can I do in the bathroom?  Use night lights.  Install grab bars by the toilet and in the tub and shower. Do not use towel bars as grab bars.  Use non-skid mats or decals in the tub or shower.  If you need to sit down in the shower, use a plastic, non-slip stool.  Keep the floor dry. Clean up any water that spills on the floor as soon as it happens.  Remove soap buildup in the tub or shower regularly.  Attach bath mats securely with double-sided non-slip rug tape.  Do not have throw rugs and other things on the floor that can make you trip. What can I do in the bedroom?  Use night lights.  Make sure that you have a light by your bed that is easy to reach.  Do not use any sheets or blankets that are too big for your bed. They should not hang down onto the floor.  Have  a firm chair that has side arms. You can use this for support while you get dressed.  Do not have throw rugs and other things on the floor that can make you trip. What can I do in the kitchen?  Clean up any spills right away.  Avoid walking on wet floors.  Keep items that you use a lot in easy-to-reach places.  If you need to reach something above you, use a strong step stool that has a grab bar.  Keep electrical cords out of the way.  Do not use floor polish or wax that makes floors slippery. If you must use wax, use non-skid floor wax.  Do not have throw rugs and other things on the floor that can make you trip. What can I do with my stairs?  Do not leave any items on the stairs.  Make sure that there are handrails on both sides of the stairs and use them. Fix handrails that are broken or loose. Make sure that handrails are as long as the stairways.  Check any carpeting to make sure  that it is firmly attached to the stairs. Fix any carpet that is loose or worn.  Avoid having throw rugs at the top or bottom of the stairs. If you do have throw rugs, attach them to the floor with carpet tape.  Make sure that you have a light switch at the top of the stairs and the bottom of the stairs. If you do not have them, ask someone to add them for you. What else can I do to help prevent falls?  Wear shoes that:  Do not have high heels.  Have rubber bottoms.  Are comfortable and fit you well.  Are closed at the toe. Do not wear sandals.  If you use a stepladder:  Make sure that it is fully opened. Do not climb a closed stepladder.  Make sure that both sides of the stepladder are locked into place.  Ask someone to hold it for you, if possible.  Clearly mark and make sure that you can see:  Any grab bars or handrails.  First and last steps.  Where the edge of each step is.  Use tools that help you move around (mobility aids) if they are needed. These include:  Canes.  Walkers.  Scooters.  Crutches.  Turn on the lights when you go into a dark area. Replace any light bulbs as soon as they burn out.  Set up your furniture so you have a clear path. Avoid moving your furniture around.  If any of your floors are uneven, fix them.  If there are any pets around you, be aware of where they are.  Review your medicines with your doctor. Some medicines can make you feel dizzy. This can increase your chance of falling. Ask your doctor what other things that you can do to help prevent falls. This information is not intended to replace advice given to you by your health care provider. Make sure you discuss any questions you have with your health care provider. Document Released: 05/09/2009 Document Revised: 12/19/2015 Document Reviewed: 08/17/2014 Elsevier Interactive Patient Education  2017 [employer].

## 2020-12-11 NOTE — Progress Notes (Signed)
Subjective:   [name] is a 65 y.o. female who presents for Medicare Annual (Subsequent) preventive examination.  Virtual Visit via Telephone Note  I connected with  [name] on 12/11/20 at  9:45 AM EDT by telephone and verified that I am speaking with the correct person using two identifiers.  Location: Patient: Home Provider: [facility] Persons participating in the virtual visit: patient/Nurse Health Advisor   I discussed the limitations, risks, security and privacy concerns of performing an evaluation and management service by telephone and the availability of in person appointments. The patient expressed understanding and agreed to proceed.  Interactive audio and video telecommunications were attempted between this nurse and patient, however failed, due to patient having technical difficulties OR patient did not have access to video capability.  We continued and completed visit with audio only.  Some vital signs may be absent or patient reported.   Zenaida Tesar E Gabryela Kimbrell, LPN   Review of Systems     Cardiac Risk Factors include: advanced age (>86mn, >>58women);diabetes mellitus;smoking/ tobacco exposure;obesity (BMI >30kg/m2);sedentary lifestyle;dyslipidemia;hypertension     Objective:    Today's Vitals   12/11/20 0951  Weight: 219 lb (99.3 kg)  Height: 5' 3"  (1.6 m)  PainSc: 6    Body mass index is 38.79 kg/m.  Advanced Directives 12/11/2020 10/07/2020 12/11/2019 02/11/2018 08/10/2017 07/16/2017 01/29/2017  Does Patient Have a Medical Advance Directive? No Yes No Yes Yes Yes No  Type of Advance Directive - HWaldronLiving will - HRichlandLiving will HBrittonLiving will Living will -  Does patient want to make changes to medical advance directive? - - - - No - Patient declined No - Patient declined -  Copy of HBoydtonin Chart? - Yes - validated most recent copy scanned in chart (See row  information) - No - copy requested No - copy requested - -  Would patient like information on creating a medical advance directive? No - Patient declined - No - Patient declined - - - Yes (MAU/Ambulatory/Procedural Areas - Information given)  Pre-existing out of facility DNR order (yellow form or pink MOST form) - - - - - - -    Current Medications (verified) Outpatient Encounter Medications as of 12/11/2020  Medication Sig  . albuterol (PROVENTIL HFA;VENTOLIN HFA) 108 (90 Base) MCG/ACT inhaler Inhale 2 puffs into the lungs every 6 (six) hours as needed for wheezing or shortness of breath.  . ALPRAZolam (XANAX) 1 MG tablet Take 1 tablet (1 mg total) by mouth 3 (three) times daily as needed for anxiety.  .[name] KitchenamLODipine (NORVASC) 10 MG tablet Take 1 tablet (10 mg total) by mouth daily.  .[name] Kitchenaspirin EC 81 MG tablet Take 1 tablet (81 mg total) by mouth daily. Swallow whole.  .[name] Kitchenatorvastatin (LIPITOR) 80 MG tablet Take 1 tablet (80 mg total) by mouth at bedtime.  . Blood Glucose Calibration (TRUE METRIX LEVEL 1) Low SOLN Use with glucose monitor Dx E11.9  . budesonide-formoterol (SYMBICORT) 160-4.5 MCG/ACT inhaler Inhale 2 puffs into the lungs 2 (two) times daily.  . Cholecalciferol (VITAMIN D3) 5000 units TABS Take 10,000 Units by mouth every evening.  . escitalopram (LEXAPRO) 20 MG tablet Take 1 tablet (20 mg total) by mouth daily.  . fish oil-omega-3 fatty acids 1000 MG capsule Take 1,000 mg by mouth at bedtime.  . furosemide (LASIX) 20 MG tablet TAKE 1 TABLET EVERY DAY AS NEEDED  . hydrOXYzine (VISTARIL) 25 MG capsule  Take 1 capsule by mouth three times daily as needed  . isosorbide mononitrate (IMDUR) 30 MG 24 hr tablet Take 0.5 tablets (15 mg total) by mouth daily.  [name] lisinopril (ZESTRIL) 40 MG tablet Take 1 tablet (40 mg total) by mouth daily.  . metFORMIN (GLUCOPHAGE) 500 MG tablet Take 1 tablet (500 mg total) by mouth 2 (two) times daily with a meal.  . metoprolol succinate (TOPROL XL) 25 MG 24  hr tablet Take 1 tablet (25 mg total) by mouth daily.  . pramipexole (MIRAPEX) 1 MG tablet Take 1 mg by mouth in the morning and at bedtime.  . vitamin B-12 (CYANOCOBALAMIN) 1000 MCG tablet Take 1,000 mcg by mouth daily.  . Alcohol Swabs (B-D SINGLE USE SWABS REGULAR) PADS check blood sugars twice daily Dx E11.9 (Patient not taking: Reported on 12/11/2020)  . Blood Glucose Monitoring Suppl (TRUE METRIX AIR GLUCOSE METER) w/Device KIT check blood sugars twice daily Dx E11.9 (Patient not taking: Reported on 12/11/2020)  . glucose blood (TRUE METRIX BLOOD GLUCOSE TEST) test strip Use to check blood sugars twice daily Dx E11.9 (Patient not taking: Reported on 12/11/2020)  . TRUEplus Lancets 33G MISC Use to check blood sugars twice daily Dx E11.9 (Patient not taking: Reported on 12/11/2020)   No facility-administered encounter medications on file as of 12/11/2020.    Allergies (verified) Food, Nutmeg oil (myristica oil), and Other   History: Past Medical History:  Diagnosis Date  . Anxiety   . Breast cancer ([place]) 06/30/11   inv ductal, ER/PR +, her-2 -  . CAD (coronary artery disease)    NSTEMI 4/08: OM3 occluded (PCI unsuccessful), dRCA 95% => BMS, inf HK, EF 50%;   b. MV 11/12:  IL ischemia => c.  [place] 11/12:  dLAD 70%, OM1 70-80%, then occluded (no change from 2008), dOM filled L->L collats, mRCA stent occluded, dRCA filled L->R collats, EF 55-65% => med Rx (consider PCI of RCA if refractory angina)  . Carotid stenosis    dopplers 5/08: 0-30% bilateral; 10/12: 0-39% B/L ICA => f/u 04/2013  . Chronic kidney disease   . COPD (chronic obstructive pulmonary disease) ([place])   . Depression   . Depression with anxiety   . GERD (gastroesophageal reflux disease)    OTC acid reducer prn  . Headache(784.0)    sinus; occ. migraines  . Hx of radiation therapy 08/24/11 to 10/07/11   L breast  . Hyperlipidemia   . Hypertension    under control; has been on med. x 4 yrs.  . Myocardial infarction ([place])   .  Stable angina ([place])    a. med Rx after cath 05/2011  . Stress incontinence, female    Past Surgical History:  Procedure Laterality Date  . AXILLARY LYMPH NODE DISSECTION  07/31/2011   Procedure: AXILLARY LYMPH NODE DISSECTION;  Surgeon: [name], MD;  Location: [facility];  Service: General;  Laterality: Left;  left axillary sentinal node biopsy  . BREAST SURGERY    . CARDIAC CATHETERIZATION  11/02/2006; 06/03/2011  . CESAREAN SECTION  1987  . CORONARY STENT PLACEMENT  11/02/2006  . ELBOW SURGERY     right  . LEFT HEART CATH AND CORONARY ANGIOGRAPHY N/A 10/07/2020   Procedure: LEFT HEART CATH AND CORONARY ANGIOGRAPHY;  Surgeon: [name], MD;  Location: [facility];  Service: Cardiovascular;  Laterality: N/A;  . MASTECTOMY PARTIAL / LUMPECTOMY  06/30/2011   left   Family History  Problem Relation Age of Onset  .  Heart disease Mother   . Heart disease Father   . Cancer Maternal Aunt        pt unaware of what kind  . Cancer Maternal Grandmother        ovarian  . Heart attack Sister   . Thyroid disease Daughter   . Anxiety disorder Daughter   . Depression Daughter   . Bipolar disorder Sister    Social History   Socioeconomic History  . Marital status: Single    Spouse name: Not on file  . Number of children: 1  . Years of education: 12+  . Highest education level: Some college, no degree  Occupational History  . Not on file  Tobacco Use  . Smoking status: Current Every Day Smoker    Packs/day: 1.00    Years: 45.00    Pack years: 45.00    Types: Cigarettes  . Smokeless tobacco: Never Used  . Tobacco comment: on and off  Vaping Use  . Vaping Use: Never used  Substance and Sexual Activity  . Alcohol use: Yes    Alcohol/week: 1.0 standard drink    Types: 1 Glasses of wine per week    Comment: occasionally  . Drug use: No  . Sexual activity: Never    Birth control/protection: Post-menopausal  Other Topics Concern  . Not on file  Social  History Narrative   Lives at home alone. She has a daughter and grandchildren that she interacts with often.    Social Determinants of Health   Financial Resource Strain: Medium Risk  . Difficulty of Paying Living Expenses: Somewhat hard  Food Insecurity: Food Insecurity Present  . Worried About Charity fundraiser in the Last Year: Sometimes true  . Ran Out of Food in the Last Year: Sometimes true  Transportation Needs: No Transportation Needs  . Lack of Transportation (Medical): No  . Lack of Transportation (Non-Medical): No  Physical Activity: Not on file  Stress: Stress Concern Present  . Feeling of Stress : Very much  Social Connections: Moderately Isolated  . Frequency of Communication with Friends and Family: Twice a week  . Frequency of Social Gatherings with Friends and Family: Once a week  . Attends Religious Services: 1 to 4 times per year  . Active Member of Clubs or Organizations: No  . Attends Archivist Meetings: Never  . Marital Status: Never married    Tobacco Counseling Ready to quit: Not Answered Counseling given: Not Answered Comment: on and off   Clinical Intake:  Pre-visit preparation completed: Yes  Pain : 0-10 Pain Score: 6  Pain Type: Chronic pain Pain Location: Back Pain Descriptors / Indicators: Aching,Sore Pain Onset: More than a month ago Pain Frequency: Intermittent     BMI - recorded: 38.79 Nutritional Status: BMI > 30  Obese Nutritional Risks: None Diabetes: Yes CBG done?: No Did pt. bring in CBG monitor from home?: No  How often do you need to have someone help you when you read instructions, pamphlets, or other written materials from your doctor or pharmacy?: 1 - Never  Nutrition Risk Assessment:  Has the patient had any N/V/D within the last 2 months?  No  Does the patient have any non-healing wounds?  No  Has the patient had any unintentional weight loss or weight gain?  No   Diabetes:  Is the patient  diabetic?  Yes  If diabetic, was a CBG obtained today?  No  Did the patient bring in their glucometer from home?  No  How often do you monitor your CBG's? She has glucometer, but doesn't check her sugar regularly.   Financial Strains and Diabetes Management:  Are you having any financial strains with the device, your supplies or your medication? No .  Does the patient want to be seen by Chronic Care Management for management of their diabetes?  No  Would the patient like to be referred to a Nutritionist or for Diabetic Management?  No   Diabetic Exams:  Diabetic Eye Exam: Completed 05/06/2020. Overdue for diabetic eye exam. Pt has been advised about the importance in completing this exam. A referral has been placed today. Message sent to referral coordinator for scheduling purposes. Advised pt to expect a call from office referred to regarding appt.  Diabetic Foot Exam: Completed 09/05/2019. Pt has been advised about the importance in completing this exam. Pt is scheduled for diabetic foot exam on 01/07/2021.    Interpreter Needed?: No  Information entered by :: Tejal Monroy, LPN   Activities of Daily Living In your present state of health, do you have any difficulty performing the following activities: 12/11/2020  Hearing? N  Vision? N  Difficulty concentrating or making decisions? Y  Walking or climbing stairs? Y  Dressing or bathing? N  Doing errands, shopping? N  Preparing Food and eating ? N  Using the Toilet? N  In the past six months, have you accidently leaked urine? Y  Comment wears pads for protection  Do you have problems with loss of bowel control? N  Managing your Medications? N  Managing your Finances? N  Housekeeping or managing your Housekeeping? N  Some recent data might be hidden    Patient Care Team: [name], FNP as PCP - General (Family Medicine) Nat Christen, MD as Attending Physician (Optometry) Ilean China, RN as Case  Manager Forrest, [name], [name] as Social Worker (Licensed Clinical Social Worker) Johney Frame, Greer Ee, MD as Consulting Physician (Cardiology) Steffanie Rainwater, DPM as Consulting Physician (Podiatry)  Indicate any recent [place] you may have received from other than [facility] providers in the past year (date may be approximate).     Assessment:   This is a routine wellness examination for Akeyla.  Hearing/Vision screen  Hearing Screening   125Hz  250Hz  500Hz  1000Hz  2000Hz  3000Hz  4000Hz  6000Hz  8000Hz   Right ear:           Left ear:           Comments: Denies hearing difficulties  Vision Screening Comments: Wears glasses- Annual visits with Dr Joya San in [place] - up to date with eye exam  Dietary issues and exercise activities discussed: Current Exercise Habits: Home exercise routine, Type of exercise: walking, Time (Minutes): 10, Frequency (Times/Week): 7, Weekly Exercise (Minutes/Week): 70, Intensity: Mild, Exercise limited by: respiratory conditions(s);orthopedic condition(s)  Goals Addressed              This Visit's Progress   .  "I would like to feel less depressed" (pt-stated)   Not on track     Current Barriers:  . Chronic Disease Management support and education needs related to depression and anxiety.  Nurse Case Manager Clinical Goal(s):  [name] Over the next 30 days, patient will verbalize understanding of plan for depression management. . Over the next 30 days, patient will attend all scheduled medical appointments: PCP and CCM team . Over the next 30 days, patient will work with CM clinical social worker to discuss depression management  Interventions:  . Evaluation  of current treatment plan related to depression and anxiety and patient's adherence to plan as established by provider. . Reviewed medications with patient and discussed Lexapro and Xanax. Patient would like to try a different or an additional medication to help with depression. Friend suggested  Pristiq. [name] Discussed plans with patient for ongoing care management follow up and provided patient with direct contact information for care management team . Social Work referral for depression. [name] RNCM will collaborate with PCP regarding ongoing depression and request to adjust medications.  Patient Self Care Activities:  . Performs ADL's independently . Performs IADL's independently  Initial goal documentation     .  "I would like to sleep better" (pt-stated)   Not on track     Current Barriers:  . Chronic Disease Management support and education needs related to insomnia.  Nurse Case Manager Clinical Goal(s):  [name] Over the next 30 days, patient will work with Consulting civil engineer to address needs related to insomnia  Interventions:  . Evaluation of current treatment plan related to insomnia and patient's adherence to plan as established by provider. . Reviewed medications with patient and discussed Ambien . Discussed plans with patient for ongoing care management follow up and provided patient with direct contact information for care management team . Recommended Melatonin 1 to 22m at 8pm nightly . Educational materials to be printed and mailed on good sleep hygiene . Discussed patient's fears/concerns related to Ambien. Will inform PCP.  Patient Self Care Activities:  . Performs ADL's independently . Performs IADL's independently  Initial goal documentation.inical     .  Exercise 3x per week (30 min per time)   Not on track     Depression Screen PHQ 2/9 Scores 12/11/2020 11/20/2020 09/27/2020 06/27/2020 03/26/2020 03/26/2020 12/19/2019  PHQ - 2 Score 3 4 6 5  0 0 6  PHQ- 9 Score 15 14 19 19  0 - 24    Fall Risk Fall Risk  12/11/2020 09/27/2020 06/27/2020 03/26/2020 12/19/2019  Falls in the past year? 1 0 0 0 0  Number falls in past yr: 0 - - - -  Injury with Fall? 0 - - - -  Risk for fall due to : History of fall(s);Impaired balance/gait;Orthopedic patient;Impaired vision;Medication side  effect - - - -  Follow up Education provided;Falls prevention discussed - - - -    FALL RISK PREVENTION PERTAINING TO THE HOME:  Any stairs in or around the home? Yes  If so, are there any without handrails? Yes  - 3-4 steps in front without handrails Home free of loose throw rugs in walkways, pet beds, electrical cords, etc? Yes  Adequate lighting in your home to reduce risk of falls? Yes   ASSISTIVE DEVICES UTILIZED TO PREVENT FALLS:  Life alert? No  Use of a cane, walker or w/c? Yes  Grab bars in the bathroom? No  Shower chair or bench in shower? Yes  Elevated toilet seat or a handicapped toilet? No   TIMED UP AND GO:  Was the test performed? No . Telephonic visit.  Cognitive Function: MMSE - Mini Mental State Exam 02/11/2018 01/29/2017 04/18/2015  Orientation to time 5 5 5   Orientation to Place 5 5 5   Registration 3 3 3   Attention/ Calculation 5 5 5   Recall 3 3 3   Language- name 2 objects 2 2 2   Language- repeat 1 1 1   Language- follow 3 step command 3 3 3   Language- read & follow direction 1 1 1   Write  a sentence 1 1 1   Copy design 1 1 1   Total score 30 30 30      6CIT Screen 12/11/2020 12/11/2019  What Year? 0 points 0 points  What month? 0 points 0 points  What time? 0 points 0 points  Count back from 20 0 points 0 points  Months in reverse 0 points 0 points  Repeat phrase 2 points 0 points  Total Score 2 0    Immunizations Immunization History  Administered Date(s) Administered  . Tdap 05/17/2001    TDAP status: Due, Education has been provided regarding the importance of this vaccine. Advised may receive this vaccine at local pharmacy or Health Dept. Aware to provide a copy of the vaccination record if obtained from local pharmacy or Health Dept. Verbalized acceptance and understanding.  Flu Vaccine status: Declined, Education has been provided regarding the importance of this vaccine but patient still declined. Advised may receive this vaccine at local  pharmacy or Health Dept. Aware to provide a copy of the vaccination record if obtained from local pharmacy or Health Dept. Verbalized acceptance and understanding.  Pneumococcal vaccine status: Declined,  Education has been provided regarding the importance of this vaccine but patient still declined. Advised may receive this vaccine at local pharmacy or Health Dept. Aware to provide a copy of the vaccination record if obtained from local pharmacy or Health Dept. Verbalized acceptance and understanding.   Covid-19 vaccine status: Declined, Education has been provided regarding the importance of this vaccine but patient still declined. Advised may receive this vaccine at local pharmacy or Health Dept.or vaccine clinic. Aware to provide a copy of the vaccination record if obtained from local pharmacy or Health Dept. Verbalized acceptance and understanding.  Qualifies for Shingles Vaccine? Yes   Zostavax completed No   Shingrix Completed?: No.    Education has been provided regarding the importance of this vaccine. Patient has been advised to call insurance company to determine out of pocket expense if they have not yet received this vaccine. Advised may also receive vaccine at local pharmacy or Health Dept. Verbalized acceptance and understanding.  Screening Tests Health Maintenance  Topic Date Due  . COVID-19 Vaccine (1) Never done  . Fecal DNA (Cologuard)  Never done  . MAMMOGRAM  12/22/2014  . FOOT EXAM  09/04/2020  . PAP SMEAR-Modifier  12/26/2020 (Originally 05/24/2004)  . TETANUS/TDAP  06/27/2021 (Originally 05/18/2011)  . LIPID PANEL  12/28/2020  . INFLUENZA VACCINE  02/24/2021  . HEMOGLOBIN A1C  03/30/2021  . OPHTHALMOLOGY EXAM  05/06/2021  . Hepatitis C Screening  Completed  . HIV Screening  Completed  . HPV VACCINES  Aged Out    Health Maintenance  Health Maintenance Due  Topic Date Due  . COVID-19 Vaccine (1) Never done  . Fecal DNA (Cologuard)  Never done  . MAMMOGRAM   12/22/2014  . FOOT EXAM  09/04/2020    Colorectal cancer screening: Type of screening: Cologuard. Completed 11/2020. Repeat every 3 years  Mammogram status: Ordered 11/2020. Pt provided with contact info and advised to call to schedule appt.   Bone Density status: Completed 08/20/2011. Results reflect: Bone density results: NORMAL. Repeat every 2-5 years. Starting next year at age 84  Lung Cancer Screening: (Low Dose CT Chest recommended if Age 55-80 years, 30 pack-year currently smoking OR have quit w/in 15years.) does qualify.   Lung Cancer Screening Referral: 12/11/20  Additional Screening:  Hepatitis C Screening: does qualify; Completed 03/12/2015  Vision Screening: Recommended annual ophthalmology exams  for early detection of glaucoma and other disorders of the eye. Is the patient up to date with their annual eye exam?  Yes  Who is the provider or what is the name of the office in which the patient attends annual eye exams? [facility] If pt is not established with a provider, would they like to be referred to a provider to establish care? No .   Dental Screening: Recommended annual dental exams for proper oral hygiene  Community Resource Referral / Chronic Care Management: CRR required this visit?  Yes   CCM required this visit?  No      Plan:     I have personally reviewed and noted the following in the patient's chart:   . Medical and social history . Use of alcohol, tobacco or illicit drugs  . Current medications and supplements including opioid prescriptions.  . Functional ability and status . Nutritional status . Physical activity . Advanced directives . List of other physicians . Hospitalizations, surgeries, and ER visits in previous 12 months . Vitals . Screenings to include cognitive, depression, and falls . Referrals and appointments  In addition, I have reviewed and discussed with patient certain preventive protocols, quality metrics, and best practice  recommendations. A written personalized care plan for preventive services as well as general preventive health recommendations were provided to patient.     Sandrea Hammond, LPN   4/49/2010   Nurse Notes: Has mold concerns in her home; financial instability, food insecurity - CRR sent.

## 2020-12-12 ENCOUNTER — Other Ambulatory Visit: Payer: Medicare HMO

## 2020-12-12 ENCOUNTER — Telehealth: Payer: Self-pay

## 2020-12-12 NOTE — Telephone Encounter (Signed)
   Telephone encounter was:  Successful.  12/12/2020 Name: [name] MRN: [id number] DOB: [birth year]  [name] is a 65 y.o. year old female who is a primary care patient of [name], [place] . The community resource team was consulted for assistance with mold removal, Medicare Extrahelp  Care guide performed the following interventions: Patient provided with information about care guide support team and interviewed to confirm resource needs.  Follow Up Plan:  Care guide will follow up with patient by phone over the next 7 days  Hosanna Betley, AAS Paralegal, [place] . Embedded Care Coordination [facility]  Care Management  [address], [place] [address] ??[name]  ?? [phone]   [email or web address]

## 2020-12-16 ENCOUNTER — Telehealth: Payer: Self-pay

## 2020-12-16 NOTE — Telephone Encounter (Signed)
   Telephone encounter was:  Successful.  12/16/2020 Name: JENISHA FAISON MRN: [id number] DOB: April 04, 1956  ILMA ACHEE is a 65 y.o. year old female who is a primary care patient of [name], [place] . The community resource team was consulted for assistance with mold assessment, Medicare Extrahelp/SHIIP counselor  Care guide performed the following interventions: Follow up call placed to the patient to discuss status of referral Spoke with patient she plans on calling [facility]/mold assessment, [place] counselor/Medicare Extrahelp this week. .  Follow Up Plan:  No further follow up planned at this time. The patient has been provided with needed resources.  Kaizlee Carlino, AAS Paralegal, [place] . Embedded Care Coordination [facility]  Care Management  [address], [place] [address] ??[email or web address]  ?? [phone]   [email or web address]

## 2020-12-17 ENCOUNTER — Other Ambulatory Visit (HOSPITAL_COMMUNITY): Payer: Self-pay

## 2020-12-17 ENCOUNTER — Encounter (HOSPITAL_COMMUNITY): Payer: Self-pay

## 2020-12-17 DIAGNOSIS — Z122 Encounter for screening for malignant neoplasm of respiratory organs: Secondary | ICD-10-CM

## 2020-12-17 DIAGNOSIS — Z87891 Personal history of nicotine dependence: Secondary | ICD-10-CM

## 2020-12-17 NOTE — Progress Notes (Signed)
Received referral for initial lung cancer screening scan. Contacted patient and obtained smoking history (started age 65 smoking about a 1/2PPD until the age of 39 when she increased to 1PPD, current smoker continuing to smoke 1PPD, 46.5 pack year) as well as answering questions related to the screening process. Patient denies signs/symptoms of lung cancer such as weight loss or hemoptysis. Patient denies comorbidity that would prevent curative treatment if lung cancer were to be found. Patient to be scheduled for [facility] and LDCT.

## 2020-12-17 NOTE — Progress Notes (Signed)
LDCT order placed.

## 2020-12-18 ENCOUNTER — Ambulatory Visit
Admission: RE | Admit: 2020-12-18 | Discharge: 2020-12-18 | Disposition: A | Discharge status: Home / Self Care | Payer: Medicare HMO | Source: Ambulatory Visit | Attending: Nurse Practitioner | Admitting: Nurse Practitioner

## 2020-12-18 ENCOUNTER — Other Ambulatory Visit: Payer: Self-pay

## 2020-12-18 DIAGNOSIS — Z1231 Encounter for screening mammogram for malignant neoplasm of breast: Secondary | ICD-10-CM

## 2020-12-27 ENCOUNTER — Telehealth: Payer: Medicare HMO

## 2020-12-28 ENCOUNTER — Other Ambulatory Visit: Payer: Self-pay | Admitting: Nurse Practitioner

## 2020-12-28 DIAGNOSIS — F3342 Major depressive disorder, recurrent, in full remission: Secondary | ICD-10-CM

## 2020-12-28 DIAGNOSIS — I10 Essential (primary) hypertension: Secondary | ICD-10-CM

## 2020-12-28 DIAGNOSIS — E1159 Type 2 diabetes mellitus with other circulatory complications: Secondary | ICD-10-CM

## 2021-01-03 ENCOUNTER — Other Ambulatory Visit (HOSPITAL_COMMUNITY): Payer: Medicare HMO

## 2021-01-07 ENCOUNTER — Ambulatory Visit: Payer: Self-pay | Admitting: Nurse Practitioner

## 2021-01-14 ENCOUNTER — Other Ambulatory Visit: Payer: Self-pay

## 2021-01-14 ENCOUNTER — Encounter: Payer: Self-pay | Admitting: Nurse Practitioner

## 2021-01-14 ENCOUNTER — Ambulatory Visit (INDEPENDENT_AMBULATORY_CARE_PROVIDER_SITE_OTHER): Payer: Medicare HMO | Admitting: Nurse Practitioner

## 2021-01-14 VITALS — BP 122/61 | HR 67 | Temp 97.7°F | Resp 20 | Ht 63.0 in | Wt 224.0 lb

## 2021-01-14 DIAGNOSIS — I2583 Coronary atherosclerosis due to lipid rich plaque: Secondary | ICD-10-CM

## 2021-01-14 DIAGNOSIS — I10 Essential (primary) hypertension: Secondary | ICD-10-CM | POA: Diagnosis not present

## 2021-01-14 DIAGNOSIS — F411 Generalized anxiety disorder: Secondary | ICD-10-CM | POA: Diagnosis not present

## 2021-01-14 DIAGNOSIS — F3342 Major depressive disorder, recurrent, in full remission: Secondary | ICD-10-CM | POA: Diagnosis not present

## 2021-01-14 DIAGNOSIS — R609 Edema, unspecified: Secondary | ICD-10-CM

## 2021-01-14 DIAGNOSIS — E559 Vitamin D deficiency, unspecified: Secondary | ICD-10-CM

## 2021-01-14 DIAGNOSIS — J449 Chronic obstructive pulmonary disease, unspecified: Secondary | ICD-10-CM

## 2021-01-14 DIAGNOSIS — E7800 Pure hypercholesterolemia, unspecified: Secondary | ICD-10-CM

## 2021-01-14 DIAGNOSIS — E1159 Type 2 diabetes mellitus with other circulatory complications: Secondary | ICD-10-CM

## 2021-01-14 DIAGNOSIS — I2510 Atherosclerotic heart disease of native coronary artery without angina pectoris: Secondary | ICD-10-CM

## 2021-01-14 DIAGNOSIS — I6523 Occlusion and stenosis of bilateral carotid arteries: Secondary | ICD-10-CM | POA: Diagnosis not present

## 2021-01-14 DIAGNOSIS — G2581 Restless legs syndrome: Secondary | ICD-10-CM

## 2021-01-14 LAB — CMP14+EGFR
ALT: 15 IU/L (ref 0–32)
AST: 12 IU/L (ref 0–40)
Albumin/Globulin Ratio: 1.8 (ref 1.2–2.2)
Albumin: 4.4 g/dL (ref 3.8–4.8)
Alkaline Phosphatase: 122 IU/L — ABNORMAL HIGH (ref 44–121)
BUN/Creatinine Ratio: 8 — ABNORMAL LOW (ref 12–28)
BUN: 7 mg/dL — ABNORMAL LOW (ref 8–27)
Bilirubin Total: 0.3 mg/dL (ref 0.0–1.2)
CO2: 24 mmol/L (ref 20–29)
Calcium: 9.8 mg/dL (ref 8.7–10.3)
Chloride: 101 mmol/L (ref 96–106)
Creatinine, Ser: 0.84 mg/dL (ref 0.57–1.00)
Globulin, Total: 2.5 g/dL (ref 1.5–4.5)
Glucose: 122 mg/dL — ABNORMAL HIGH (ref 65–99)
Potassium: 4.6 mmol/L (ref 3.5–5.2)
Sodium: 142 mmol/L (ref 134–144)
Total Protein: 6.9 g/dL (ref 6.0–8.5)
eGFR: 78 mL/min/{1.73_m2} (ref >59)

## 2021-01-14 LAB — CBC WITH DIFFERENTIAL/PLATELET
Basophils Absolute: 0.1 10*3/uL (ref 0.0–0.2)
Basos: 0 %
EOS (ABSOLUTE): 0.2 10*3/uL (ref 0.0–0.4)
Eos: 2 %
Hematocrit: 45 % (ref 34.0–46.6)
Hemoglobin: 14.1 g/dL (ref 11.1–15.9)
Immature Grans (Abs): 0.1 10*3/uL (ref 0.0–0.1)
Immature Granulocytes: 1 %
Lymphocytes Absolute: 1 10*3/uL (ref 0.7–3.1)
Lymphs: 8 %
MCH: 24.7 pg — ABNORMAL LOW (ref 26.6–33.0)
MCHC: 31.3 g/dL — ABNORMAL LOW (ref 31.5–35.7)
MCV: 79 fL (ref 79–97)
Monocytes Absolute: 0.5 10*3/uL (ref 0.1–0.9)
Monocytes: 4 %
Neutrophils Absolute: 10.9 10*3/uL — ABNORMAL HIGH (ref 1.4–7.0)
Neutrophils: 85 %
Platelets: 247 10*3/uL (ref 150–450)
RBC: 5.7 x10E6/uL — ABNORMAL HIGH (ref 3.77–5.28)
RDW: 16.6 % — ABNORMAL HIGH (ref 11.7–15.4)
WBC: 12.8 10*3/uL — ABNORMAL HIGH (ref 3.4–10.8)

## 2021-01-14 LAB — BAYER DCA HB A1C WAIVED: HB A1C (BAYER DCA - WAIVED): 6.6 % (ref <7.0)

## 2021-01-14 LAB — LIPID PANEL
Chol/HDL Ratio: 3.5 ratio (ref 0.0–4.4)
Cholesterol, Total: 146 mg/dL (ref 100–199)
HDL: 42 mg/dL (ref >39)
LDL Chol Calc (NIH): 81 mg/dL (ref 0–99)
Triglycerides: 129 mg/dL (ref 0–149)
VLDL Cholesterol Cal: 23 mg/dL (ref 5–40)

## 2021-01-14 MED ORDER — ALPRAZOLAM 1 MG PO TABS: 1.0000 mg | ORAL_TABLET | Freq: Three times a day (TID) | ORAL | 2 refills | Status: DC | PRN

## 2021-01-14 MED ORDER — PRAMIPEXOLE DIHYDROCHLORIDE 1 MG PO TABS
1.0000 mg | ORAL_TABLET | Freq: Two times a day (BID) | ORAL | 1 refills | Status: DC
Start: 2021-01-14 — End: 2021-04-17

## 2021-01-14 MED ORDER — METOPROLOL SUCCINATE ER 25 MG PO TB24: 25.0000 mg | ORAL_TABLET | Freq: Every day | ORAL | 1 refills | Status: DC

## 2021-01-14 MED ORDER — ATORVASTATIN CALCIUM 80 MG PO TABS
80.0000 mg | ORAL_TABLET | Freq: Every day | ORAL | 1 refills | Status: DC
Start: 2021-01-14 — End: 2021-04-17

## 2021-01-14 MED ORDER — AMLODIPINE BESYLATE 10 MG PO TABS: 10.0000 mg | ORAL_TABLET | Freq: Every day | ORAL | 1 refills | Status: DC

## 2021-01-14 MED ORDER — BUDESONIDE-FORMOTEROL FUMARATE 160-4.5 MCG/ACT IN AERO
2.0000 | INHALATION_SPRAY | Freq: Two times a day (BID) | RESPIRATORY_TRACT | 3 refills | Status: DC
Start: 2021-01-14 — End: 2021-04-17

## 2021-01-14 MED ORDER — METFORMIN HCL 500 MG PO TABS: 1.0000 | ORAL_TABLET | Freq: Two times a day (BID) | ORAL | 1 refills | Status: DC

## 2021-01-14 MED ORDER — FUROSEMIDE 40 MG PO TABS: 40.0000 mg | ORAL_TABLET | Freq: Every day | ORAL | 1 refills | Status: DC

## 2021-01-14 MED ORDER — LISINOPRIL 40 MG PO TABS: 40.0000 mg | ORAL_TABLET | Freq: Every day | ORAL | 1 refills | Status: DC

## 2021-01-14 MED ORDER — ESCITALOPRAM OXALATE 20 MG PO TABS: 1.0000 | ORAL_TABLET | Freq: Every day | ORAL | 1 refills | Status: DC

## 2021-01-14 MED ORDER — ISOSORBIDE MONONITRATE ER 30 MG PO TB24: 15.0000 mg | ORAL_TABLET | Freq: Every day | ORAL | 1 refills | Status: DC

## 2021-01-14 NOTE — Patient Instructions (Signed)
Peripheral Edema Peripheral edema is swelling that is caused by a buildup of fluid. Peripheral edema most often affects the lower legs, ankles, and feet. It can also develop in the arms, hands, and face. The area of the body that has peripheral edema will look swollen. It may also feel heavy or warm. Your clothes may start to feel tight. Pressing on the area may make a temporary dent in your skin. You may not be able to move your swollen arm or leg as much as usual. There are many causes of peripheral edema. It can happen because of a complication of other conditions such as congestive heart failure, kidney disease, or a problem with your blood circulation. It also can be a side effect of certain medicines or because of an infection. It often happens to women during pregnancy. Sometimes, the cause is not known. Follow these instructions at home: Managing pain, stiffness, and swelling  Raise (elevate) your legs while you are sitting or lying down. Move around often to prevent stiffness and to lessen swelling. Do not sit or stand for long periods of time. Wear support stockings as told by your health care provider. Medicines Take over-the-counter and prescription medicines only as told by your health care provider. Your health care provider may prescribe medicine to help your body get rid of excess water (diuretic). General instructions Pay attention to any changes in your symptoms. Follow instructions from your health care provider about limiting salt (sodium) in your diet. Sometimes, eating less salt may reduce swelling. Moisturize skin daily to help prevent skin from cracking and draining. Keep all follow-up visits as told by your health care provider. This is important. Contact a health care provider if you have: A fever. Edema that starts suddenly or is getting worse, especially if you are pregnant or have a medical condition. Swelling in only one leg. Increased swelling, redness, or pain in  one or both of your legs. Drainage or sores at the area where you have edema. Get help right away if you: Develop shortness of breath, especially when you are lying down. Have pain in your chest or abdomen. Feel weak. Feel faint. Summary Peripheral edema is swelling that is caused by a buildup of fluid. Peripheral edema most often affects the lower legs, ankles, and feet. Move around often to prevent stiffness and to lessen swelling. Do not sit or stand for long periods of time. Pay attention to any changes in your symptoms. Contact a health care provider if you have edema that starts suddenly or is getting worse, especially if you are pregnant or have a medical condition. Get help right away if you develop shortness of breath, especially when lying down. This information is not intended to replace advice given to you by your health care provider. Make sure you discuss any questions you have with your health care provider. Document Revised: 04/06/2018 Document Reviewed: 04/06/2018 Elsevier Patient Education  2022 Elsevier Inc.  

## 2021-01-14 NOTE — Progress Notes (Signed)
Subjective:    Patient ID: [name], female    DOB: June 01, 1956, 65 y.o.   MRN: [id number]   Chief Complaint: Medical Management of Chronic Issues    HPI:  1. Primary hypertension No c/o chest pain, or headache. Doe snot check blood pressure at home. BP Readings from Last 3 Encounters:  01/14/21 122/61  12/05/20 124/76  10/07/20 137/67    2. Type 2 diabetes mellitus with other circulatory complication, without long-term current use of insulin (HCC) She has not been checking her blood sugars, nor has she been wathcing her diet. Lab Results  Component Value Date   HGBA1C 6.3 09/27/2020     3. Pure hypercholesterolemia Has not been watching diet and has been doing no exercise. Lab Results  Component Value Date   CHOL 148 09/27/2020   HDL 42 09/27/2020   LDLCALC 80 09/27/2020   LDLDIRECT 100.0 05/16/2007   TRIG 146 09/27/2020   CHOLHDL 3.5 09/27/2020     4. Coronary artery disease due to lipid rich plaque Again no chest pain . Has an echo cardiogram scheduled for next week.  5. Bilateral carotid artery stenosis Last carotid doppler study was done on 08/25/18. Showed less than 50% stenosis. Will repeat next year.  6. Chronic obstructive pulmonary disease, unspecified COPD type ([place]) Has trouble breathing when active. The symbicort does not seem to be working. She thinks that [place] worked better. Insurance will not pay for triligy.  7. Recurrent major depressive disorder, in full remission ([place]) Is on lexapro which is helping some. Depression screen [facility] 2/9 01/14/2021 12/11/2020 11/20/2020  Decreased Interest 2 1 2   Down, Depressed, Hopeless 2 2 2   PHQ - 2 Score 4 3 4   Altered sleeping 3 3 2   Tired, decreased energy 3 3 2   Change in appetite 2 3 2   Feeling bad or failure about yourself  2 2 1   Trouble concentrating 2 1 2   Moving slowly or fidgety/restless 1 0 1  Suicidal thoughts 0 0 0  PHQ-9 Score 17 15 14   Difficult doing work/chores Very difficult  Somewhat difficult Somewhat difficult  Some recent data might be hidden     8. Generalized anxiety disorder Is on xanax daily which helps calm her down GAD 7 : Generalized Anxiety Score 01/14/2021 09/27/2020 06/27/2020 03/26/2020  Nervous, Anxious, on Edge 2 3 1  0  Control/stop worrying 1 3 3 1   Worry too much - different things 1 3 3 1   Trouble relaxing 2 1 1  0  Restless 1 0 0 0  Easily annoyed or irritable 2 0 1 0  Afraid - awful might happen 2 0 0 0  Total GAD 7 Score 11 10 9 2   Anxiety Difficulty Very difficult Not difficult at all Somewhat difficult Not difficult at all      9. Restless leg syndrome Is on mirapex at night which helpskeep her legs calm so sh ecan et some sleep.  10. Vitamin D deficiency Is on daily vitamin d supplent  11. Morbid obesity ([place]) No recent weight changes Wt Readings from Last 3 Encounters:  01/14/21 224 lb (101.6 kg)  12/11/20 219 lb (99.3 kg)  12/05/20 219 lb 6.4 oz (99.5 kg)   BMI Readings from Last 3 Encounters:  01/14/21 39.68 kg/m  12/11/20 38.79 kg/m  12/05/20 38.86 kg/m       Outpatient Encounter Medications as of 01/14/2021  Medication Sig   albuterol (PROVENTIL HFA;VENTOLIN HFA) 108 (90 Base) MCG/ACT inhaler Inhale 2 puffs  into the lungs every 6 (six) hours as needed for wheezing or shortness of breath.   Alcohol Swabs (B-D SINGLE USE SWABS REGULAR) PADS check blood sugars twice daily Dx E11.9   ALPRAZolam (XANAX) 1 MG tablet Take 1 tablet (1 mg total) by mouth 3 (three) times daily as needed for anxiety.   amLODipine (NORVASC) 10 MG tablet Take 1 tablet (10 mg total) by mouth daily.   aspirin EC 81 MG tablet Take 1 tablet (81 mg total) by mouth daily. Swallow whole.   atorvastatin (LIPITOR) 80 MG tablet Take 1 tablet (80 mg total) by mouth at bedtime.   Blood Glucose Calibration (TRUE METRIX LEVEL 1) Low SOLN Use with glucose monitor Dx E11.9   Blood Glucose Monitoring Suppl (TRUE METRIX AIR GLUCOSE METER) w/Device KIT check  blood sugars twice daily Dx E11.9   budesonide-formoterol (SYMBICORT) 160-4.5 MCG/ACT inhaler Inhale 2 puffs into the lungs 2 (two) times daily.   Cholecalciferol (VITAMIN D3) 5000 units TABS Take 10,000 Units by mouth every evening.   escitalopram (LEXAPRO) 20 MG tablet TAKE 1 TABLET EVERY DAY   fish oil-omega-3 fatty acids 1000 MG capsule Take 1,000 mg by mouth at bedtime.   furosemide (LASIX) 20 MG tablet TAKE 1 TABLET EVERY DAY AS NEEDED   glucose blood (TRUE METRIX BLOOD GLUCOSE TEST) test strip Use to check blood sugars twice daily Dx E11.9   hydrOXYzine (VISTARIL) 25 MG capsule Take 1 capsule by mouth three times daily as needed   isosorbide mononitrate (IMDUR) 30 MG 24 hr tablet Take 0.5 tablets (15 mg total) by mouth daily.   lisinopril (ZESTRIL) 40 MG tablet TAKE 1 TABLET EVERY DAY   metFORMIN (GLUCOPHAGE) 500 MG tablet TAKE 1 TABLET TWICE DAILY WITH MEALS   metoprolol succinate (TOPROL XL) 25 MG 24 hr tablet Take 1 tablet (25 mg total) by mouth daily.   pramipexole (MIRAPEX) 1 MG tablet Take 1 mg by mouth in the morning and at bedtime.   TRUEplus Lancets 33G MISC Use to check blood sugars twice daily Dx E11.9   vitamin B-12 (CYANOCOBALAMIN) 1000 MCG tablet Take 1,000 mcg by mouth daily.   No facility-administered encounter medications on file as of 01/14/2021.    Past Surgical History:  Procedure Laterality Date   AXILLARY LYMPH NODE DISSECTION  07/31/2011   Procedure: AXILLARY LYMPH NODE DISSECTION;  Surgeon: [name], MD;  Location: [place];  Service: General;  Laterality: Left;  left axillary sentinal node biopsy   BREAST LUMPECTOMY Left 06/30/2011   BREAST SURGERY     CARDIAC CATHETERIZATION  11/02/2006; 06/03/2011   CESAREAN SECTION  1987   CORONARY STENT PLACEMENT  11/02/2006   ELBOW SURGERY     right   LEFT HEART CATH AND CORONARY ANGIOGRAPHY N/A 10/07/2020   Procedure: LEFT HEART CATH AND CORONARY ANGIOGRAPHY;  Surgeon: [name], MD;   Location: [facility];  Service: Cardiovascular;  Laterality: N/A;    Family History  Problem Relation Age of Onset   Heart disease Mother    Heart disease Father    Cancer Maternal Aunt        pt unaware of what kind   Cancer Maternal Grandmother        ovarian   Heart attack Sister    Thyroid disease Daughter    Anxiety disorder Daughter    Depression Daughter    Bipolar disorder Sister     New complaints: None new  Social history: Lives by herself-  talks to her daughter daily.  Controlled substance contract: 07/03/20     Review of Systems  Constitutional:  Negative for diaphoresis.  Eyes:  Negative for pain.  Respiratory:  Positive for shortness of breath.   Cardiovascular:  Positive for leg swelling. Negative for chest pain and palpitations.  Gastrointestinal:  Negative for abdominal pain.  Endocrine: Negative for polydipsia.  Skin:  Negative for rash.  Neurological:  Negative for dizziness, weakness and headaches.  Hematological:  Does not bruise/bleed easily.  All other systems reviewed and are negative.     Objective:   Physical Exam Vitals and nursing note reviewed.  Constitutional:      General: She is not in acute distress.    Appearance: Normal appearance. She is well-developed.  HENT:     Head: Normocephalic.     Right Ear: Tympanic membrane normal.     Left Ear: Tympanic membrane normal.     Nose: Nose normal.     Mouth/Throat:     Mouth: Mucous membranes are moist.  Eyes:     Pupils: Pupils are equal, round, and reactive to light.  Neck:     Vascular: No carotid bruit or JVD.  Cardiovascular:     Rate and Rhythm: Normal rate and regular rhythm.     Heart sounds: Normal heart sounds.  Pulmonary:     Effort: Pulmonary effort is normal. No respiratory distress.     Breath sounds: Rales (fine in bil bases) present. No wheezing.  Chest:     Chest wall: No tenderness.  Abdominal:     General: Bowel sounds are normal. There is no  distension or abdominal bruit.     Palpations: Abdomen is soft. There is no hepatomegaly, splenomegaly, mass or pulsatile mass.     Tenderness: There is no abdominal tenderness.  Musculoskeletal:        General: Normal range of motion.     Cervical back: Normal range of motion and neck supple.     Right lower leg: Edema (2+) present.     Left lower leg: Edema (2+) present.  Lymphadenopathy:     Cervical: No cervical adenopathy.  Skin:    General: Skin is warm and dry.  Neurological:     Mental Status: She is alert and oriented to person, place, and time.     Deep Tendon Reflexes: Reflexes are normal and symmetric.  Psychiatric:        Behavior: Behavior normal.        Thought Content: Thought content normal.        Judgment: Judgment normal.   BP 122/61   Pulse 67   Temp 97.7 F (36.5 C) (Temporal)   Resp 20   Ht 5' 3"  (1.6 m)   Wt 224 lb (101.6 kg)   LMP 10/17/2005   SpO2 94%   BMI 39.68 kg/m    Hgba1c 6.6      Assessment & Plan:  [name] comes in today with chief complaint of Medical Management of Chronic Issues   Diagnosis and orders addressed:  1. Primary hypertension Low sodium diet - CBC with Differential/Platelet - CMP14+EGFR - metoprolol succinate (TOPROL XL) 25 MG 24 hr tablet; Take 1 tablet (25 mg total) by mouth daily.  Dispense: 90 tablet; Refill: 1 - lisinopril (ZESTRIL) 40 MG tablet; Take 1 tablet (40 mg total) by mouth daily.  Dispense: 90 tablet; Refill: 1 - amLODipine (NORVASC) 10 MG tablet; Take 1 tablet (10 mg total) by mouth daily.  Dispense: 90 tablet; Refill: 1  2. Type 2 diabetes mellitus with other circulatory complication, without long-term current use of insulin (HCC) Stricter carb counting - Bayer DCA Hb A1c Waived - metFORMIN (GLUCOPHAGE) 500 MG tablet; Take 1 tablet (500 mg total) by mouth 2 (two) times daily with a meal.  Dispense: 180 tablet; Refill: 1  3. Pure hypercholesterolemia Low fta diet - Lipid panel -  atorvastatin (LIPITOR) 80 MG tablet; Take 1 tablet (80 mg total) by mouth at bedtime.  Dispense: 90 tablet; Refill: 1  4. Coronary artery disease due to lipid rich plaque Keep followup with cardiology - metoprolol succinate (TOPROL XL) 25 MG 24 hr tablet; Take 1 tablet (25 mg total) by mouth daily.  Dispense: 90 tablet; Refill: 1  5. Bilateral carotid artery stenosis Will rechek in 1 year  6. Chronic obstructive pulmonary disease, unspecified COPD type ([place]) Contniue symbicort as rx - budesonide-formoterol (SYMBICORT) 160-4.5 MCG/ACT inhaler; Inhale 2 puffs into the lungs 2 (two) times daily.  Dispense: 1 each; Refill: 3  7. Recurrent major depressive disorder, in full remission ([place]) Atress management - escitalopram (LEXAPRO) 20 MG tablet; Take 1 tablet (20 mg total) by mouth daily.  Dispense: 90 tablet; Refill: 1  8. Generalized anxiety disorder - ALPRAZolam (XANAX) 1 MG tablet; Take 1 tablet (1 mg total) by mouth 3 (three) times daily as needed for anxiety.  Dispense: 90 tablet; Refill: 2  9. Restless leg syndrome Keep legs warm at night - pramipexole (MIRAPEX) 1 MG tablet; Take 1 tablet (1 mg total) by mouth in the morning and at bedtime.  Dispense: 90 tablet; Refill: 1  10. Vitamin D deficiency Daily vitamind supplement  11. Morbid obesity ([place]) Discussed diet and exercise for person with BMI >25 Will recheck weight in 3-6 months  12. Atherosclerosis of native coronary artery without angina pectoris, unspecified whether native or transplanted heart - isosorbide mononitrate (IMDUR) 30 MG 24 hr tablet; Take 0.5 tablets (15 mg total) by mouth daily.  Dispense: 90 tablet; Refill: 1  13. Peripheral edema Elevatelegs when sitting Increased lasix to 63m daily Wear compression socks - Brain natriuretic peptide   Labs pending Health Maintenance reviewed Diet and exercise encouraged  Follow up plan: 3 months   [place] FNP

## 2021-01-15 NOTE — Progress Notes (Signed)
[name]  Visit Date: 01/16/2021 Visit Type: In-Person at [place] SHARED DECISION-MAKING VISIT - Age: 65 y.o.  - Pack year smoking history: 46.5 pack-years  - Type of tobacco abuse: Cigarettes - Current smoker or < 15 years of cessation: Current smoker - No current symptoms of lung cancer:   Patient denies any hemoptysis, unintentional weight loss, and unexplained cough - Risks and benefits of lung cancer screening discussed: Negative: Over-diagnosis, radiation exposure, false positives, and additional testing Positive: Discover early stage lung cancer resulting in higher incidence of cure - Patient educated regarding the importance of adherence to continued lung cancer screening. - Currently, there are no co-morbidities to prevent treatment to therapy for lung cancer and the patient is agreeable to pursue treatment if a malignancy is discovered.  [place] Preventative Services Task Force recommend annual screening for lung cancer with low-dose CT in adults aged 64 - 46 years who have a 20+ pack year smoking history and currently smoke or have quit smoking within the past 15 years.  Screening should be discontinued once a person has not smoked for 15 years or develops a health problem that substantially limits life expectancy or the ability or willingness to have curative lung surgery.  It is a category B recommendation.  Similar stances are provided by CMS, NCCN, and AATS.  Social History   Tobacco Use   Smoking status: Every Day    Packs/day: 1.00    Years: 45.00    Pack years: 45.00    Types: Cigarettes   Smokeless tobacco: Never   Tobacco comments:    on and off  Vaping Use   Vaping Use: Never used  Substance Use Topics   Alcohol use: Yes    Alcohol/week: 1.0 standard drink    Types: 1 Glasses of wine per week    Comment: occasionally   Drug use: No     Personal history of tobacco use presenting hazards to health: - This patient  meets criteria for low-dose CT lung cancer screening  - The shared decision making visit discussion included risks and benefits of screening, potential for follow-up, diagnostic testing for abnormal scans, potential for false positive tests, overdiagnosis, discussion about total radiation exposure - Patient stated willingness to undergo diagnostics and treatment as needed - Patient was counseled on smoking cessation to decrease the  risk of lung cancer, pulmonary disease, heart disease, and stroke - Patient has been referred to Lung Cancer Screening Nurse Coordinator for further scheduling of LDCT and for further resources regarding free nicotine replacement therapy and information about smoking cessation classes - Counseling on the importance of adherence to annual lung cancer LDCT screening, impact of co-morbidities, and ability or willingness to undergo diagnosis and treatment is imperative for compliance of the program. - Counseling on the importance of continued smoking cessation for former smokers; the importance of smoking cessation for current smokers, and information about tobacco cessation interventions have been given to patient including [place] and 1-800-quit [place] programs.  Yearly follow up will be coordinated by Adonis Huguenin, RN, MSN [facility] Oncology Nurse Navigator.)  [name], PA-C  01/16/21 1:01 PM

## 2021-01-16 ENCOUNTER — Ambulatory Visit (HOSPITAL_COMMUNITY)
Admission: RE | Admit: 2021-01-16 | Discharge: 2021-01-16 | Disposition: A | Discharge status: Home / Self Care | Payer: Medicare HMO | Source: Ambulatory Visit | Attending: Nurse Practitioner | Admitting: Nurse Practitioner

## 2021-01-16 ENCOUNTER — Other Ambulatory Visit: Payer: Self-pay

## 2021-01-16 ENCOUNTER — Inpatient Hospital Stay (HOSPITAL_COMMUNITY): Payer: Medicare HMO | Attending: Physician Assistant | Admitting: Physician Assistant

## 2021-01-16 DIAGNOSIS — F17210 Nicotine dependence, cigarettes, uncomplicated: Secondary | ICD-10-CM | POA: Insufficient documentation

## 2021-01-16 DIAGNOSIS — Z87891 Personal history of nicotine dependence: Secondary | ICD-10-CM

## 2021-01-16 NOTE — Patient Instructions (Signed)
You were seen today for Lung Cancer Screening shared decision-making visit and your first low-dose CT scan.

## 2021-01-20 ENCOUNTER — Other Ambulatory Visit (HOSPITAL_COMMUNITY): Payer: Medicare HMO

## 2021-01-20 NOTE — Progress Notes (Signed)
Pt called and aware

## 2021-01-22 ENCOUNTER — Encounter (HOSPITAL_COMMUNITY): Payer: Self-pay

## 2021-01-22 ENCOUNTER — Other Ambulatory Visit (HOSPITAL_COMMUNITY): Payer: Self-pay

## 2021-01-22 DIAGNOSIS — Z87891 Personal history of nicotine dependence: Secondary | ICD-10-CM

## 2021-01-22 DIAGNOSIS — Z122 Encounter for screening for malignant neoplasm of respiratory organs: Secondary | ICD-10-CM

## 2021-01-22 NOTE — Progress Notes (Signed)
Patient notified of LDCT Lung Cancer Screening Results via telephone with the recommendation to follow-up in 6 months. Patient's referring provider has been made aware of results. Results are as follows:  IMPRESSION: 1. Lung-RADS 3, probably benign findings. Short-term follow-up in 6 months is recommended with repeat low-dose chest CT without contrast (please use the following order, "CT CHEST LCS NODULE FOLLOW-UP W/O CM"). 6.9 mm apical right upper lobe nodule and 6.3 mm left lower lobe nodule. These results will be called to the ordering clinician or representative by the Radiologist Assistant, and communication documented in the PACS or [employer]. 2. Aortic atherosclerosis (ICD10-I70.0). Coronary artery calcification. 3.  Emphysema (ICD10-J43.9).

## 2021-02-05 ENCOUNTER — Ambulatory Visit (HOSPITAL_COMMUNITY): Payer: Medicare HMO | Attending: Cardiology

## 2021-02-05 ENCOUNTER — Other Ambulatory Visit: Payer: Self-pay

## 2021-02-05 DIAGNOSIS — I25118 Atherosclerotic heart disease of native coronary artery with other forms of angina pectoris: Secondary | ICD-10-CM | POA: Diagnosis not present

## 2021-02-05 LAB — ECHOCARDIOGRAM COMPLETE
AR max vel: 1.7 cm2
AV Area VTI: 1.8 cm2
AV Area mean vel: 1.69 cm2
AV Mean grad: 9 mmHg
AV Peak grad: 19.9 mmHg
Ao pk vel: 2.23 m/s
Area-P 1/2: 2.2 cm2
S' Lateral: 3.3 cm

## 2021-02-11 ENCOUNTER — Ambulatory Visit (INDEPENDENT_AMBULATORY_CARE_PROVIDER_SITE_OTHER): Payer: Medicare HMO | Admitting: Licensed Clinical Social Worker

## 2021-02-11 DIAGNOSIS — I10 Essential (primary) hypertension: Secondary | ICD-10-CM

## 2021-02-11 DIAGNOSIS — I2583 Coronary atherosclerosis due to lipid rich plaque: Secondary | ICD-10-CM | POA: Diagnosis not present

## 2021-02-11 DIAGNOSIS — E7800 Pure hypercholesterolemia, unspecified: Secondary | ICD-10-CM

## 2021-02-11 DIAGNOSIS — I2510 Atherosclerotic heart disease of native coronary artery without angina pectoris: Secondary | ICD-10-CM

## 2021-02-11 DIAGNOSIS — J449 Chronic obstructive pulmonary disease, unspecified: Secondary | ICD-10-CM | POA: Diagnosis not present

## 2021-02-11 DIAGNOSIS — F411 Generalized anxiety disorder: Secondary | ICD-10-CM

## 2021-02-11 DIAGNOSIS — E1159 Type 2 diabetes mellitus with other circulatory complications: Secondary | ICD-10-CM | POA: Diagnosis not present

## 2021-02-11 DIAGNOSIS — R0602 Shortness of breath: Secondary | ICD-10-CM

## 2021-02-11 DIAGNOSIS — F3342 Major depressive disorder, recurrent, in full remission: Secondary | ICD-10-CM

## 2021-02-11 NOTE — Patient Instructions (Signed)
Visit Information  PATIENT GOALS:  Goals Addressed             This Visit's Progress    Manage My Emotions       Protect My Health; Manage depression issues; Manage anxiety issues       Timeframe:  Short-Term Goal Priority:  Medium Progress: On Track Start Date:    02/11/21                         Expected End Date:  05/06/21                     Follow Up Date 03/21/21   Protect My Health (Patient)  Manage depression issues; Manage anxiety issues    Why is this important?   Screening tests can find diseases early when they are easier to treat.  Your doctor or nurse will talk with you about which tests are important for you.  Getting shots for common diseases like the flu and shingles will help prevent them.     Patient Self Care Activities:  Self administers medications as prescribed Attends scheduled medical appointment  Patient Coping Strengths:  Family Friends  Patient Self Care Deficits:  Depression issues Anxiety issues  Patient Goals:  - spend time or talk with others at least 2 to 3 times per week - practice relaxation or meditation daily - keep a calendar with appointment dates  Follow Up Plan: [name] to call client or daughter of client on 03/21/21 to assess client needs     [name] MSW, [name] Licensed Clinical Social Worker [facility] Care Management [phone]

## 2021-02-11 NOTE — Chronic Care Management (AMB) (Signed)
Chronic Care Management    Clinical Social Work Note  02/11/2021 Name: [name] MRN: [id number] DOB: 04/04/1956  [name] is a 65 y.o. year old female who is a primary care patient of [name], [place]. The CCM team was consulted to assist the patient with chronic disease management and/or care coordination needs related to: [name] .   Engaged with patient by telephone for follow up visit in response to provider referral for social work chronic care management and care coordination services.   Consent to Services:  The patient was given information about Chronic Care Management services, agreed to services, and gave verbal consent prior to initiation of services.  Please see initial visit note for detailed documentation.   Patient agreed to services and consent obtained.   Assessment: Review of patient past medical history, allergies, medications, and health status, including review of relevant consultants reports was performed today as part of a comprehensive evaluation and provision of chronic care management and care coordination services.     SDOH (Social Determinants of Health) assessments and interventions performed:  SDOH Interventions    Flowsheet Row Most Recent Value  SDOH Interventions   Depression Interventions/Treatment  Currently on Treatment        Advanced Directives Status: See Vynca application for related entries.  CCM Care Plan  Allergies  Allergen Reactions   Food Other (See Comments)    Nutmeg= difficulty breathing   Nutmeg Oil (Myristica Oil) Hives   Other Other (See Comments)    Nutmeg= difficulty breathing    Outpatient Encounter Medications as of 02/11/2021  Medication Sig   albuterol (PROVENTIL HFA;VENTOLIN HFA) 108 (90 Base) MCG/ACT inhaler Inhale 2 puffs into the lungs every 6 (six) hours as needed for wheezing or shortness of breath.   Alcohol Swabs (B-D SINGLE USE SWABS REGULAR) PADS check blood sugars twice  daily Dx E11.9   ALPRAZolam (XANAX) 1 MG tablet Take 1 tablet (1 mg total) by mouth 3 (three) times daily as needed for anxiety.   amLODipine (NORVASC) 10 MG tablet Take 1 tablet (10 mg total) by mouth daily.   aspirin EC 81 MG tablet Take 1 tablet (81 mg total) by mouth daily. Swallow whole.   atorvastatin (LIPITOR) 80 MG tablet Take 1 tablet (80 mg total) by mouth at bedtime.   Blood Glucose Calibration (TRUE METRIX LEVEL 1) Low SOLN Use with glucose monitor Dx E11.9   Blood Glucose Monitoring Suppl (TRUE METRIX AIR GLUCOSE METER) w/Device KIT check blood sugars twice daily Dx E11.9   budesonide-formoterol (SYMBICORT) 160-4.5 MCG/ACT inhaler Inhale 2 puffs into the lungs 2 (two) times daily.   Cholecalciferol (VITAMIN D3) 5000 units TABS Take 10,000 Units by mouth every evening.   escitalopram (LEXAPRO) 20 MG tablet Take 1 tablet (20 mg total) by mouth daily.   fish oil-omega-3 fatty acids 1000 MG capsule Take 1,000 mg by mouth at bedtime.   furosemide (LASIX) 40 MG tablet Take 1 tablet (40 mg total) by mouth daily.   glucose blood (TRUE METRIX BLOOD GLUCOSE TEST) test strip Use to check blood sugars twice daily Dx E11.9   hydrOXYzine (VISTARIL) 25 MG capsule Take 1 capsule by mouth three times daily as needed   isosorbide mononitrate (IMDUR) 30 MG 24 hr tablet Take 0.5 tablets (15 mg total) by mouth daily.   lisinopril (ZESTRIL) 40 MG tablet Take 1 tablet (40 mg total) by mouth daily.   metFORMIN (GLUCOPHAGE) 500 MG tablet Take 1 tablet (500 mg total)  by mouth 2 (two) times daily with a meal.   metoprolol succinate (TOPROL XL) 25 MG 24 hr tablet Take 1 tablet (25 mg total) by mouth daily.   pramipexole (MIRAPEX) 1 MG tablet Take 1 tablet (1 mg total) by mouth in the morning and at bedtime.   TRUEplus Lancets 33G MISC Use to check blood sugars twice daily Dx E11.9   vitamin B-12 (CYANOCOBALAMIN) 1000 MCG tablet Take 1,000 mcg by mouth daily.   No facility-administered encounter medications on  file as of 02/11/2021.    Patient Active Problem List   Diagnosis Date Noted   Depression 04/08/2016   Morbid obesity ([place]) 12/20/2015   Type 2 diabetes mellitus ([place]) 04/18/2015   Restless leg syndrome 03/12/2015   Vitamin D deficiency 03/12/2015   Generalized anxiety disorder 02/14/2014   Malignant neoplasm of upper-inner quadrant of female breast ([place]) 07/30/2011   Myocardial infarction ([place])    Angina pectoris ([place]) 05/19/2011   Hypertension    Hyperlipidemia    CAD (coronary artery disease)    COPD (chronic obstructive pulmonary disease) (HCC)    Carotid stenosis     Conditions to be addressed/monitored: Monitor client management of depression issues and of anxiety issues   Care Plan : [name] care plan  Updates made by Tracy Cabal, [name] since 02/11/2021 12:00 AM     Problem: Emotional Distress      Goal: Emotional Health Supported; Manage depression and anxiety issues   Start Date: 02/11/2021  Expected End Date: 05/06/2021  This Visit's Progress: On track  Recent Progress: On track  Priority: Medium  Note:   Current Barriers:  Chronic Mental Health needs related to depression and anxiety Mobility issues Suicidal Ideation/Homicidal Ideation: No  Clinical Social Work Goal(s):  patient will work with SW monthly by telephone or in person to reduce or manage symptoms related to depression and anxiety patient will work with SW monthly to address concerns related to mobility of client and related to pain issues of client  Interventions: 1:1 collaboration with [name], [place] regarding development and update of comprehensive plan of care as evidenced by provider attestation and co-signature Reviewed chart to assess client needs Talked with client about pain issues of client Talked with client about family support Talked with client about transport needs Talked with client about appetite of client  Talked with client about meal provision Talked with  client about medication procurement Talked with client about walking of client  Talked with client about swelling in her feet (client has talked with [name] about swelling in feet of client ) Talked with client about her use of recliner to elevate her feet Talked with client about mood of client (client is concerned about health needs of client) Talked with client  about scan on her lungs (she said she will go back for another scan for her lung in 6 months) Client spoke of cramps in her torso, feet and legs Talked with client about energy level of client Talked with client about her occasional shortness of breath Talked with client about relaxation techniques (likes to watch TV) Talked with [name] about sleeping challenges of client  Talked with client about her upcoming medical appointments Talked with client about challenges in standing Talked with [name] about CCM program support Encouraged [name] to call [facility] as needed for CCM nursing support  Patient Self Care Activities:  Self administers medications as prescribed Attends scheduled medical appointment  Patient Coping Strengths:  Family Friends  Patient Self  Care Deficits:  Depression issues Anxiety issues  Patient Goals:  - spend time or talk with others at least 2 to 3 times per week - practice relaxation or meditation daily - keep a calendar with appointment dates  Follow Up Plan: [name] to call client or daughter of client on 03/21/21 to assess client needs     [name] MSW, [name] Licensed Clinical Social Worker [facility] Care Management [phone]

## 2021-02-25 ENCOUNTER — Ambulatory Visit: Payer: Medicare HMO | Admitting: Physician Assistant

## 2021-03-05 ENCOUNTER — Ambulatory Visit: Payer: Medicare HMO | Admitting: Cardiology

## 2021-03-11 ENCOUNTER — Ambulatory Visit: Payer: Medicare HMO | Admitting: Cardiology

## 2021-03-21 ENCOUNTER — Telehealth: Payer: Medicare HMO

## 2021-03-24 NOTE — Progress Notes (Deleted)
Cardiology Office Note:    Date:  03/24/2021   ID:  [name], DOB 1956/05/11, MRN [id number]  PCP:  Tracy Pretty, FNP   [name] Providers Cardiologist:  Tracy Bergeron, MD { Click to update primary MD,subspecialty MD or APP then REFRESH:1}    Referring MD: [name], Tracy, *   Follow-up for coronary artery disease and hyperlipidemia.  History of Present Illness:    [name] is a 65 y.o. female with a hx of anxiety, breast CA, CAD-NSTEMI in 2008, hypertension, hyperlipidemia, diabetes mellitus type 2, bilateral carotid artery stenosis, COPD, and depression.  She underwent repeat catheterization in 2000, 2012, and began 3/22.  Her cardiac catheterization showed patent left main stent, patent LAD with 2 diagonal branches and nonobstructive disease.  Patent left circumflex with chronic occlusion of the first OM branch.  Chronic occlusion of mid RCA with extensive left-to-right collaterals supplied by an epicardial circumflex branch and septal perforating branches of the LAD.  Her LVEF was normal and she did not have any RWMA.  Continued medical management was recommended.  During her 3/22 visit she reported more fatigue and decreased endurance.  She also noted worsening dyspnea on exertion and mild chest discomfort.  Her symptoms resolved with rest and did not occur at rest.  She was seen in follow-up by Dr. Johney Reid on 12/05/2020.  During that time she reported that she has been feeling okay.  She reported that she felt like she was suffering from depression.  Her shortness of breath was intermittent.  She reported taking her medications as prescribed without side effects.  She was planning to follow-up with her PCP for further management of her depression and  COPD.  She denied chest discomfort, palpitations, presyncope, syncope, and lightheadedness.  She presents the Reid today for follow-up evaluation states***  *** denies chest pain, shortness  of breath, lower extremity edema, fatigue, palpitations, melena, hematuria, hemoptysis, diaphoresis, weakness, presyncope, syncope, orthopnea, and PND.   Past Medical History:  Diagnosis Date   Anxiety    Breast cancer ([place]) 06/30/11   inv ductal, ER/PR +, her-2 -   CAD (coronary artery disease)    NSTEMI 4/08: OM3 occluded (PCI unsuccessful), dRCA 95% => BMS, inf HK, EF 50%;   b. MV 11/12:  IL ischemia => c.  [place] 11/12:  dLAD 70%, OM1 70-80%, then occluded (no change from 2008), dOM filled L->L collats, mRCA stent occluded, dRCA filled L->R collats, EF 55-65% => med Rx (consider PCI of RCA if refractory angina)   Carotid stenosis    dopplers 5/08: 0-30% bilateral; 10/12: 0-39% B/L ICA => f/u 04/2013   Chronic kidney disease    COPD (chronic obstructive pulmonary disease) (HCC)    Depression    Depression with anxiety    GERD (gastroesophageal reflux disease)    OTC acid reducer prn   Headache(784.0)    sinus; occ. migraines   Hx of radiation therapy 08/24/11 to 10/07/11   L breast   Hyperlipidemia    Hypertension    under control; has been on med. x 4 yrs.   Myocardial infarction ([place])    Stable angina ([place])    a. med Rx after cath 05/2011   Stress incontinence, female     Past Surgical History:  Procedure Laterality Date   AXILLARY LYMPH NODE DISSECTION  07/31/2011   Procedure: AXILLARY LYMPH NODE DISSECTION;  Surgeon: [name], MD;  Location: [facility];  Service: General;  Laterality: Left;  left axillary sentinal node biopsy   BREAST LUMPECTOMY Left 06/30/2011   BREAST SURGERY     CARDIAC CATHETERIZATION  11/02/2006; 06/03/2011   CESAREAN SECTION  1987   CORONARY STENT PLACEMENT  11/02/2006   ELBOW SURGERY     right   LEFT HEART CATH AND CORONARY ANGIOGRAPHY N/A 10/07/2020   Procedure: LEFT HEART CATH AND CORONARY ANGIOGRAPHY;  Surgeon: [name], MD;  Location: [facility];  Service: Cardiovascular;  Laterality: N/A;    Current  Medications: No outpatient medications have been marked as taking for the 03/25/21 encounter (Appointment) with Deberah Pelton, NP.     Allergies:   Food, Nutmeg oil (myristica oil), and Other   Social History   Socioeconomic History   Marital status: Single    Spouse name: Not on file   Number of children: 1   Years of education: 12+   Highest education level: Some college, no degree  Occupational History   Not on file  Tobacco Use   Smoking status: Every Day    Packs/day: 1.00    Years: 45.00    Pack years: 45.00    Types: Cigarettes   Smokeless tobacco: Never   Tobacco comments:    on and off  Vaping Use   Vaping Use: Never used  Substance and Sexual Activity   Alcohol use: Yes    Alcohol/week: 1.0 standard drink    Types: 1 Glasses of wine per week    Comment: occasionally   Drug use: No   Sexual activity: Never    Birth control/protection: Post-menopausal  Other Topics Concern   Not on file  Social History Narrative   Lives at home alone. She has a daughter and grandchildren that she interacts with often.    Social Determinants of Health   Financial Resource Strain: Medium Risk   Difficulty of Paying Living Expenses: Somewhat hard  Food Insecurity: Food Insecurity Present   Worried About [place] in the Last Year: Sometimes true   Ran Out of Food in the Last Year: Sometimes true  Transportation Needs: No Transportation Needs   Lack of Transportation (Medical): No   Lack of Transportation (Non-Medical): No  Physical Activity: Not on file  Stress: Stress Concern Present   Feeling of Stress : Very much  Social Connections: Moderately Isolated   Frequency of Communication with Friends and Family: Twice a week   Frequency of Social Gatherings with Friends and Family: Once a week   Attends Religious Services: 1 to 4 times per year   Active Member of [employer] or Organizations: No   Attends Music therapist: Never   Marital Status: Never  married     Family History: The patient's ***family history includes Anxiety disorder in her daughter; Bipolar disorder in her sister; Cancer in her maternal aunt and maternal grandmother; Depression in her daughter; Heart attack in her sister; Heart disease in her father and mother; Thyroid disease in her daughter.  ROS:   Please see the history of present illness.    *** All other systems reviewed and are negative.   Risk Assessment/Calculations:   {Does this patient have ATRIAL FIBRILLATION?[phone]}       Physical Exam:    VS:  LMP 10/17/2005     Wt Readings from Last 3 Encounters:  01/14/21 224 lb (101.6 kg)  12/11/20 219 lb (99.3 kg)  12/05/20 219 lb 6.4 oz (99.5 kg)     GEN: *** Well nourished, well developed  in no acute distress HEENT: Normal NECK: No JVD; No carotid bruits LYMPHATICS: No lymphadenopathy CARDIAC: ***RRR, no murmurs, rubs, gallops RESPIRATORY:  Clear to auscultation without rales, wheezing or rhonchi  ABDOMEN: Soft, non-tender, non-distended MUSCULOSKELETAL:  No edema; No deformity  SKIN: Warm and dry NEUROLOGIC:  Alert and oriented x 3 PSYCHIATRIC:  Normal affect    EKGs/Labs/Other Studies Reviewed:    The following studies were reviewed today: Cardiac catheterization 10/07/2020  1.  Patent left mainstem 2.  Patent LAD, supplies a large territory with 2 diagonal branches and wraps around the LV apex with no obstructive disease throughout its distribution 3.  Patent left circumflex with chronic occlusion of the first OM branch, late filling is seen in previous cardiac catheterization study 4.  Chronic occlusion of the mid RCA with extensive left-to-right collaterals supplied by an epicardial circumflex branch and septal perforating branches of the LAD 5.  Normal LV function with normal wall motion, no regional wall motion abnormalities.   Recommendations: Favor ongoing medical therapy.  Appears to have stable coronary anatomy from old  catheterization 10 years ago.  Will ask CTO specialist to review her films to see if there would be treatment options for refractory angina.  She is already on aggressive medical therapy with amlodipine, isosorbide, and metoprolol.  Diagnostic Dominance: Right Intervention   EKG:  EKG is *** ordered today.  The ekg ordered today demonstrates ***  Recent Labs: 01/14/2021: ALT 15; BUN 7; Creatinine, Ser 0.84; Hemoglobin 14.1; Platelets 247; Potassium 4.6; Sodium 142  Recent Lipid Panel    Component Value Date/Time   CHOL 146 01/14/2021 1115   CHOL 169 01/18/2013 1052   TRIG 129 01/14/2021 1115   TRIG 230 (H) 01/18/2013 1052   HDL 42 01/14/2021 1115   HDL 34 (L) 01/18/2013 1052   CHOLHDL 3.5 01/14/2021 1115   CHOLHDL 5.9 CALC 05/16/2007 0854   VLDL 47 (H) 05/16/2007 0854   LDLCALC 81 01/14/2021 1115   LDLCALC 89 01/18/2013 1052   LDLDIRECT 100.0 05/16/2007 0854    ASSESSMENT & PLAN    Coronary artery disease-no recent episodes of arm neck back or chest discomfort.  Underwent cardiac catheterization most recently on 3/22 which showed patent LAD, patent circumflex with chronic occlusion of the first OM, chronic occlusion of mid RCA with left to right extensive collaterals, and normal LV function.  Details above. Continue aspirin, amlodipine, atorvastatin, Imdur, metoprolol Heart healthy low-sodium diet-salty 6 given Increase physical activity as tolerated  Mixed hyperlipidemia-01/14/2021: Cholesterol, Total 146; HDL 42; LDL Chol Calc (NIH) 81; Triglycerides 129 Continue omega-3 fatty acids, and atorvastatin, aspirin Heart healthy low-sodium diet-salty 6 given Increase physical activity as tolerated  Essential hypertension-BP today***.  Well-controlled at home. *** denies chest pain, shortness of breath, lower extremity edema, fatigue, palpitations, melena, hematuria, hemoptysis, diaphoresis, weakness, presyncope, syncope, orthopnea, and PND. Continue amlodipine, Imdur,  metoprolol Heart healthy low-sodium diet-salty 6 given Increase physical activity as tolerated  Diabetes mellitus type 2-follows with PCP Continue metformin  COPD-follows with PCP Continue albuterol, Symbicort  Disposition: Follow-up with Dr. Johney Reid or APP in 6 months. {Are you ordering a CV Procedure (e.g. stress test, cath, DCCV, TEE, etc)?   Press F2        :[id number]}    Medication Adjustments/Labs and Tests Ordered: Current medicines are reviewed at length with the patient today.  Concerns regarding medicines are outlined above.  No orders of the defined types were placed in this encounter.  No orders of the defined types  were placed in this encounter.   There are no Patient Instructions on file for this visit.   Signed, Deberah Pelton, NP  03/24/2021 10:52 AM      Notice: This dictation was prepared with Dragon dictation along with smaller phrase technology. Any transcriptional errors that result from this process are unintentional and may not be corrected upon review.  I spent***minutes examining this patient, reviewing medications, and using patient centered shared decision making involving her cardiac care.  Prior to her visit I spent greater than 20 minutes reviewing her past medical history,  medications, and prior cardiac tests.

## 2021-03-25 ENCOUNTER — Ambulatory Visit (HOSPITAL_BASED_OUTPATIENT_CLINIC_OR_DEPARTMENT_OTHER): Payer: Medicare HMO | Admitting: General Practice

## 2021-04-16 ENCOUNTER — Other Ambulatory Visit: Payer: Self-pay | Admitting: Nurse Practitioner

## 2021-04-16 DIAGNOSIS — J449 Chronic obstructive pulmonary disease, unspecified: Secondary | ICD-10-CM

## 2021-04-16 DIAGNOSIS — G2581 Restless legs syndrome: Secondary | ICD-10-CM

## 2021-04-17 ENCOUNTER — Encounter: Payer: Self-pay | Admitting: Nurse Practitioner

## 2021-04-17 ENCOUNTER — Ambulatory Visit (INDEPENDENT_AMBULATORY_CARE_PROVIDER_SITE_OTHER): Payer: Medicare HMO | Admitting: Nurse Practitioner

## 2021-04-17 ENCOUNTER — Other Ambulatory Visit: Payer: Self-pay

## 2021-04-17 VITALS — BP 112/68 | HR 74 | Temp 97.7°F | Resp 20 | Ht 63.0 in | Wt 225.0 lb

## 2021-04-17 DIAGNOSIS — G2581 Restless legs syndrome: Secondary | ICD-10-CM | POA: Diagnosis not present

## 2021-04-17 DIAGNOSIS — J449 Chronic obstructive pulmonary disease, unspecified: Secondary | ICD-10-CM | POA: Diagnosis not present

## 2021-04-17 DIAGNOSIS — I6523 Occlusion and stenosis of bilateral carotid arteries: Secondary | ICD-10-CM

## 2021-04-17 DIAGNOSIS — I2510 Atherosclerotic heart disease of native coronary artery without angina pectoris: Secondary | ICD-10-CM

## 2021-04-17 DIAGNOSIS — E1159 Type 2 diabetes mellitus with other circulatory complications: Secondary | ICD-10-CM

## 2021-04-17 DIAGNOSIS — I10 Essential (primary) hypertension: Secondary | ICD-10-CM

## 2021-04-17 DIAGNOSIS — E559 Vitamin D deficiency, unspecified: Secondary | ICD-10-CM

## 2021-04-17 DIAGNOSIS — I2583 Coronary atherosclerosis due to lipid rich plaque: Secondary | ICD-10-CM

## 2021-04-17 DIAGNOSIS — F3342 Major depressive disorder, recurrent, in full remission: Secondary | ICD-10-CM

## 2021-04-17 DIAGNOSIS — E7800 Pure hypercholesterolemia, unspecified: Secondary | ICD-10-CM

## 2021-04-17 DIAGNOSIS — F411 Generalized anxiety disorder: Secondary | ICD-10-CM

## 2021-04-17 DIAGNOSIS — K1370 Unspecified lesions of oral mucosa: Secondary | ICD-10-CM

## 2021-04-17 LAB — BAYER DCA HB A1C WAIVED: HB A1C (BAYER DCA - WAIVED): 6.4 % — ABNORMAL HIGH (ref 4.8–5.6)

## 2021-04-17 MED ORDER — AMLODIPINE BESYLATE 10 MG PO TABS: 10.0000 mg | ORAL_TABLET | Freq: Every day | ORAL | 1 refills | Status: DC

## 2021-04-17 MED ORDER — ESCITALOPRAM OXALATE 20 MG PO TABS: 20.0000 mg | ORAL_TABLET | Freq: Every day | ORAL | 1 refills | Status: DC

## 2021-04-17 MED ORDER — FUROSEMIDE 40 MG PO TABS: 40.0000 mg | ORAL_TABLET | Freq: Every day | ORAL | 1 refills | Status: DC

## 2021-04-17 MED ORDER — ATORVASTATIN CALCIUM 80 MG PO TABS: 80.0000 mg | ORAL_TABLET | Freq: Every day | ORAL | 1 refills | Status: DC

## 2021-04-17 MED ORDER — LISINOPRIL 40 MG PO TABS: 40.0000 mg | ORAL_TABLET | Freq: Every day | ORAL | 1 refills | Status: DC

## 2021-04-17 MED ORDER — ALPRAZOLAM 1 MG PO TABS: 1.0000 mg | ORAL_TABLET | Freq: Three times a day (TID) | ORAL | 2 refills | Status: DC | PRN

## 2021-04-17 MED ORDER — PRAMIPEXOLE DIHYDROCHLORIDE 1 MG PO TABS: 1.0000 mg | ORAL_TABLET | Freq: Two times a day (BID) | ORAL | 1 refills | Status: DC

## 2021-04-17 MED ORDER — METFORMIN HCL 500 MG PO TABS: 500.0000 mg | ORAL_TABLET | Freq: Two times a day (BID) | ORAL | 1 refills | Status: DC

## 2021-04-17 MED ORDER — BUDESONIDE-FORMOTEROL FUMARATE 160-4.5 MCG/ACT IN AERO: 2.0000 | INHALATION_SPRAY | Freq: Two times a day (BID) | RESPIRATORY_TRACT | 1 refills | Status: DC

## 2021-04-17 NOTE — Progress Notes (Signed)
Subjective:    Patient ID: [name], female    DOB: [birth year], 65 y.o.   MRN: [id number]   Chief Complaint: medical management of chronic issues     HPI:  1. Primary hypertension No c/o chest pain, increasing sob or headache. She does check her blood pressure at home.systolic running in 159'Y. She stopped the amlodipine dur to swelling of feet.  BP Readings from Last 3 Encounters:  01/14/21 122/61  12/05/20 124/76  10/07/20 137/67     2. Pure hypercholesterolemia Doe snot watch diet and does little to no dedicated exercise. Lab Results  Component Value Date   CHOL 146 01/14/2021   HDL 42 01/14/2021   LDLCALC 81 01/14/2021   LDLDIRECT 100.0 05/16/2007   TRIG 129 01/14/2021   CHOLHDL 3.5 01/14/2021     3. Type 2 diabetes mellitus with other circulatory complication, without long-term current use of insulin (HCC) She does not check her blood sugars. Does not like to prick her finger. She has been trying to watch diet. Lab Results  Component Value Date   HGBA1C 6.6 01/14/2021     4. Coronary artery disease due to lipid rich plaque Last saw cardiology on 12/05/20. Only change made according to office note was 51m asa daily and metoprolol was changed to XL.  5. Bilateral carotid artery stenosis Carotid doppler study done 08/25/18 which showed less than 50% stenosis. Cardiology is keeping up with when to do repeat study.  6. Chronic obstructive pulmonary disease, unspecified COPD type ([place] She is still doing symbicort daily and has been doing well. Occasionally needs albuterol. Still smoking over a pack a day.  7. Restless leg syndrome She takes  mirapex nightly to calm legs down at bnight. Has been working well for her.  8. Recurrent major depressive disorder, in full remission ([place] Is on lexapro daily and says she is doing well right now. [facility] 04/17/2021 02/11/2021 01/14/2021  Decreased Interest _0 Down, Depressed, Hopeless _1 PHQ - 2 Score _2 Altered sleeping _3 Tired, decreased energy _4 Change in appetite _5 Feeling bad or failure about yourself  _6 Trouble concentrating _7 Moving slowly or fidgety/restless _8 Suicidal thoughts 0 0 0  PHQ-9 Score _9 Difficult doing work/chores Very difficult Somewhat difficult Very difficult  Some recent data might be hidden    9. Generalized anxiety disorder Stays anxious. Is on xanax 114mTID. We have tried weaning her down with no success. GAD 7 : Generalized Anxiety Score 04/17/2021 01/14/2021 09/27/2020 06/27/2020  Nervous, Anxious, on Edge _10 Control/stop worrying _11 Worry too much - different things _12 Trouble relaxing _13 Restless 1 1 0 0  Easily annoyed or irritable 1 2 0 1  Afraid - awful might happen 1 2 0 0  Total GAD 7 Score _14 Anxiety Difficulty Very difficult Very difficult Not difficult at all Somewhat difficult     10. Vitamin D deficiency Is on daily vitamin d supplement  11. Morbid obesity ([place] recent weight changes Wt Readings from Last 3 Encounters:  04/17/21 225 lb (102.1 kg)  01/14/21 224 lb (101.6 kg)  12/11/20 219 lb (99.3 kg)    BMI Readings from Last 3 Encounters:  04/17/21 39.86 kg/m  01/14/21 39.68 kg/m  12/11/20 38.79 kg/m     Outpatient Encounter Medications as of 04/17/2021  Medication Sig   albuterol (PROVENTIL HFA;VENTOLIN HFA) 108 (90 Base) MCG/ACT inhaler Inhale 2 puffs into the lungs every 6 (six) hours as needed for wheezing or shortness of breath.   Alcohol Swabs (B-D SINGLE USE SWABS REGULAR) PADS check blood sugars twice daily Dx E11.9   ALPRAZolam (XANAX) 1 MG tablet Take 1 tablet (1 mg total) by mouth 3 (three) times daily as needed for anxiety.   amLODipine (NORVASC) 10 MG tablet Take 1 tablet (10 mg total) by mouth daily.   aspirin EC 81 MG tablet Take 1 tablet (81 mg total) by mouth daily. Swallow whole.   atorvastatin (LIPITOR) 80 MG  tablet Take 1 tablet (80 mg total) by mouth at bedtime.   Blood Glucose Calibration (TRUE METRIX LEVEL 1) Low SOLN Use with glucose monitor Dx E11.9   Blood Glucose Monitoring Suppl (TRUE METRIX AIR GLUCOSE METER) w/Device KIT check blood sugars twice daily Dx E11.9   Cholecalciferol (VITAMIN D3) 5000 units TABS Take 10,000 Units by mouth every evening.   escitalopram (LEXAPRO) 20 MG tablet Take 1 tablet (20 mg total) by mouth daily.   fish oil-omega-3 fatty acids 1000 MG capsule Take 1,000 mg by mouth at bedtime.   furosemide (LASIX) 40 MG tablet Take 1 tablet (40 mg total) by mouth daily.   glucose blood (TRUE METRIX BLOOD GLUCOSE TEST) test strip Use to check blood sugars twice daily Dx E11.9   hydrOXYzine (VISTARIL) 25 MG capsule Take 1 capsule by mouth three times daily as needed   isosorbide mononitrate (IMDUR) 30 MG 24 hr tablet Take 0.5 tablets (15 mg total) by mouth daily.   lisinopril (ZESTRIL) 40 MG tablet Take 1 tablet (40 mg total) by mouth daily.   metFORMIN (GLUCOPHAGE) 500 MG tablet Take 1 tablet (500 mg total) by mouth 2 (two) times daily with a meal.   metoprolol succinate (TOPROL XL) 25 MG 24 hr tablet Take 1 tablet (25 mg total) by mouth daily.   pramipexole (MIRAPEX) 1 MG tablet TAKE 1 TABLET IN THE MORNING AND AT BEDTIME.   SYMBICORT 160-4.5 MCG/ACT inhaler INHALE 2 PUFFS TWICE DAILY   TRUEplus Lancets 33G MISC Use to check blood sugars twice daily Dx E11.9   vitamin B-12 (CYANOCOBALAMIN) 1000 MCG tablet Take 1,000 mcg by mouth daily.   No facility-administered encounter medications on file as of 04/17/2021.    Past Surgical History:  Procedure Laterality Date   AXILLARY LYMPH NODE DISSECTION  07/31/2011   Procedure: AXILLARY LYMPH NODE DISSECTION;  Surgeon: [name], MD;  Location: [place];  Service: General;  Laterality: Left;  left axillary sentinal node biopsy   BREAST LUMPECTOMY Left 06/30/2011   BREAST SURGERY     CARDIAC CATHETERIZATION   11/02/2006; 06/03/2011   CESAREAN SECTION  1987   CORONARY STENT PLACEMENT  11/02/2006   ELBOW SURGERY     right   LEFT HEART CATH AND CORONARY ANGIOGRAPHY N/A 10/07/2020   Procedure: LEFT HEART CATH AND CORONARY ANGIOGRAPHY;  Surgeon: [name], MD;  Location: [place] [facility];  Service: Cardiovascular;  Laterality: N/A;    Family History  Problem Relation Age of Onset   Heart disease Mother    Heart disease Father    Cancer Maternal Aunt        pt unaware of what kind   Cancer Maternal Grandmother  ovarian   Heart attack Sister    Thyroid disease Daughter    Anxiety disorder Daughter    Depression Daughter    Bipolar disorder Sister     New complaints: None today  Social history: Lives by herself  Controlled substance contract: n/a     Review of Systems  Constitutional:  Negative for diaphoresis.  Eyes:  Negative for pain.  Respiratory:  Negative for shortness of breath.   Cardiovascular:  Negative for chest pain, palpitations and leg swelling.  Gastrointestinal:  Negative for abdominal pain.  Endocrine: Negative for polydipsia.  Skin:  Negative for rash.  Neurological:  Negative for dizziness, weakness and headaches.  Hematological:  Does not bruise/bleed easily.  All other systems reviewed and are negative.     Objective:   Physical Exam Vitals and nursing note reviewed.  Constitutional:      General: She is not in acute distress.    Appearance: Normal appearance. She is well-developed.  HENT:     Head: Normocephalic.     Right Ear: Tympanic membrane normal.     Left Ear: Tympanic membrane normal.     Nose: Nose normal.     Mouth/Throat:     Mouth: Mucous membranes are moist.     Comments:  Hard lesion on inside left lower lip Eyes:     Pupils: Pupils are equal, round, and reactive to light.  Neck:     Vascular: No carotid bruit or JVD.  Cardiovascular:     Rate and Rhythm: Normal rate and regular rhythm.     Heart sounds: Normal heart  sounds.  Pulmonary:     Effort: Pulmonary effort is normal. No respiratory distress.     Breath sounds: Wheezing (exp) and rhonchi present. No rales.  Chest:     Chest wall: No tenderness.  Abdominal:     General: Bowel sounds are normal. There is no distension or abdominal bruit.     Palpations: Abdomen is soft. There is no hepatomegaly, splenomegaly, mass or pulsatile mass.     Tenderness: There is no abdominal tenderness.  Musculoskeletal:        General: Normal range of motion.     Cervical back: Normal range of motion and neck supple.     Right lower leg: Edema (1+) present.     Left lower leg: Edema (1+) present.  Lymphadenopathy:     Cervical: No cervical adenopathy.  Skin:    General: Skin is warm and dry.  Neurological:     Mental Status: She is alert and oriented to person, place, and time.     Deep Tendon Reflexes: Reflexes are normal and symmetric.  Psychiatric:        Behavior: Behavior normal.        Thought Content: Thought content normal.        Judgment: Judgment normal.    BP 112/68   Pulse 74   Temp 97.7 F (36.5 C)   Resp 20   Ht 5' 3" (1.6 m)   Wt 225 lb (102.1 kg)   LMP 10/17/2005   SpO2 92%   BMI 39.86 kg/m    HGBA1c 6.4%    Assessment & Plan:  [name] comes in today with chief complaint of Medical Management of Chronic Issues   Diagnosis and orders addressed:  1. Primary hypertension Low sodium diet - CBC with Differential/Platelet - CMP14+EGFR - lisinopril (ZESTRIL) 40 MG tablet; Take 1 tablet (40 mg total) by mouth daily.  Dispense:  90 tablet; Refill: 1 - amLODipine (NORVASC) 10 MG tablet; Take 1 tablet (10 mg total) by mouth daily.  Dispense: 90 tablet; Refill: 1  2. Pure hypercholesterolemia Low fat diet - CBC with Differential/Platelet - atorvastatin (LIPITOR) 80 MG tablet; Take 1 tablet (80 mg total) by mouth at bedtime.  Dispense: 90 tablet; Refill: 1  3. Type 2 diabetes mellitus with other circulatory complication,  without long-term current use of insulin (HCC) Cintinue to wtach carbs in diet - CBC with Differential/Platelet - Bayer DCA Hb A1c Waived - metFORMIN (GLUCOPHAGE) 500 MG tablet; Take 1 tablet (500 mg total) by mouth 2 (two) times daily with a meal.  Dispense: 180 tablet; Refill: 1  4. Coronary artery disease due to lipid rich plaque Keep yearly follow up with cardiology - CBC with Differential/Platelet  5. Bilateral carotid artery stenosis Cardiology is monitoring - CBC with Differential/Platelet  6. Chronic obstructive pulmonary disease, unspecified COPD type ([place]) Smoking cessation encouraged - budesonide-formoterol (SYMBICORT) 160-4.5 MCG/ACT inhaler; Inhale 2 puffs into the lungs 2 (two) times daily.  Dispense: 3 each; Refill: 1  7. Restless leg syndrome Keep legs warm at night - CBC with Differential/Platelet - pramipexole (MIRAPEX) 1 MG tablet; Take 1 tablet (1 mg total) by mouth in the morning and at bedtime.  Dispense: 180 tablet; Refill: 1  8. Recurrent major depressive disorder, in full remission ([place]) Stress management - CBC with Differential/Platelet - escitalopram (LEXAPRO) 20 MG tablet; Take 1 tablet (20 mg total) by mouth daily.  Dispense: 90 tablet; Refill: 1  9. Generalized anxiety disorder - CBC with Differential/Platelet - ALPRAZolam (XANAX) 1 MG tablet; Take 1 tablet (1 mg total) by mouth 3 (three) times daily as needed for anxiety.  Dispense: 90 tablet; Refill: 2  10. Vitamin D deficiency Continue daily vitamin d supplement - CBC with Differential/Platelet  11. Morbid obesity ([place]) Discussed diet and exercise for person with BMI >25 Will recheck weight in 3-6 months - CBC with Differential/Platelet  12. Oral lesion If no better in 2 weeks will do referral to oral surgeon  Labs pending Health Maintenance reviewed Diet and exercise encouraged  Follow up plan: 6 months   [place], FNP

## 2021-04-17 NOTE — Patient Instructions (Signed)
Managing the Challenge of Quitting Smoking Quitting smoking is a physical and mental challenge. You will face cravings, withdrawal symptoms, and temptation. Before quitting, work with your health care provider to make a plan that can help you manage quitting. Preparation canhelp you quit and keep you from giving in. How to manage lifestyle changes Managing stress Stress can make you want to smoke, and wanting to smoke may cause stress. It is important to find ways to manage your stress. You might try some of the following: Practice relaxation techniques. Breathe slowly and deeply, in through your nose and out through your mouth. Listen to music. Soak in a bath or take a shower. Imagine a peaceful place or vacation. Get some support. Talk with family or friends about your stress. Join a support group. Talk with a counselor or therapist. Get some physical activity. Go for a walk, run, or bike ride. Play a favorite sport. Practice yoga.  Medicines Talk with your health care provider about medicines that might help you dealwith cravings and make quitting easier for you. Relationships Social situations can be difficult when you are quitting smoking. To manage this, you can: Avoid parties and other social situations where people might be smoking. Avoid alcohol. Leave right away if you have the urge to smoke. Explain to your family and friends that you are quitting smoking. Ask for support and let them know you might be a bit grumpy. Plan activities where smoking is not an option. General instructions Be aware that many people gain weight after they quit smoking. However, not everyone does. To keep from gaining weight, have a plan in place before you quit and stick to the plan after you quit. Your plan should include: Having healthy snacks. When you have a craving, it may help to: Eat popcorn, carrots, celery, or other cut vegetables. Chew sugar-free gum. Changing how you eat. Eat small  portion sizes at meals. Eat 4-6 small meals throughout the day instead of 1-2 large meals a day. Be mindful when you eat. Do not watch television or do other things that might distract you as you eat. Exercising regularly. Make time to exercise each day. If you do not have time for a long workout, do short bouts of exercise for 5-10 minutes several times a day. Do some form of strengthening exercise, such as weight lifting. Do some exercise that gets your heart beating and causes you to breathe deeply, such as walking fast, running, swimming, or biking. This is very important. Drinking plenty of water or other low-calorie or no-calorie drinks. Drink 6-8 glasses of water daily.  How to recognize withdrawal symptoms Your body and mind may experience discomfort as you try to get used to not having nicotine in your system. These effects are called withdrawal symptoms. They may include: Feeling hungrier than normal. Having trouble concentrating. Feeling irritable or restless. Having trouble sleeping. Feeling depressed. Craving a cigarette. To manage withdrawal symptoms: Avoid places, people, and activities that trigger your cravings. Remember why you want to quit. Get plenty of sleep. Avoid coffee and other caffeinated drinks. These may worsen some of your symptoms. These symptoms may surprise you. But be assured that they are normal to havewhen quitting smoking. How to manage cravings Come up with a plan for how to deal with your cravings. The plan should include the following: A definition of the specific situation you want to deal with. An alternative action you will take. A clear idea for how this action will help. The   name of someone who might help you with this. Cravings usually last for 5-10 minutes. Consider taking the following actions to help you with your plan to deal with cravings: Keep your mouth busy. Chew sugar-free gum. Suck on hard candies or a straw. Brush your  teeth. Keep your hands and body busy. Change to a different activity right away. Squeeze or play with a ball. Do an activity or a hobby, such as making bead jewelry, practicing needlepoint, or working with wood. Mix up your normal routine. Take a short exercise break. Go for a quick walk or run up and down stairs. Focus on doing something kind or helpful for someone else. Call a friend or family member to talk during a craving. Join a support group. Contact a quitline. Where to find support To get help or find a support group: Call the National Cancer Institute's Smoking Quitline: 1-800-QUIT NOW (784-8669) Visit the website of the Substance Abuse and Mental Health Services Administration: www.samhsa.gov Text QUIT to SmokefreeTXT: 478848 Where to find more information Visit these websites to find more information on quitting smoking: National Cancer Institute: www.smokefree.gov American Lung Association: www.lung.org American Cancer Society: www.cancer.org Centers for Disease Control and Prevention: www.cdc.gov American Heart Association: www.heart.org Contact a health care provider if: You want to change your plan for quitting. The medicines you are taking are not helping. Your eating feels out of control or you cannot sleep. Get help right away if: You feel depressed or become very anxious. Summary Quitting smoking is a physical and mental challenge. You will face cravings, withdrawal symptoms, and temptation to smoke again. Preparation can help you as you go through these challenges. Try different techniques to manage stress, handle social situations, and prevent weight gain. You can deal with cravings by keeping your mouth busy (such as by chewing gum), keeping your hands and body busy, calling family or friends, or contacting a quitline for people who want to quit smoking. You can deal with withdrawal symptoms by avoiding places where people smoke, getting plenty of rest, and  avoiding drinks with caffeine. This information is not intended to replace advice given to you by your health care provider. Make sure you discuss any questions you have with your healthcare provider. Document Revised: 05/02/2019 Document Reviewed: 05/02/2019 Elsevier Patient Education  2022 Elsevier Inc.  

## 2021-04-18 ENCOUNTER — Telehealth: Payer: Self-pay | Admitting: Nurse Practitioner

## 2021-04-18 LAB — CMP14+EGFR
ALT: 12 IU/L (ref 0–32)
AST: 10 IU/L (ref 0–40)
Albumin/Globulin Ratio: 1.6 (ref 1.2–2.2)
Albumin: 4.2 g/dL (ref 3.8–4.8)
Alkaline Phosphatase: 100 IU/L (ref 44–121)
BUN/Creatinine Ratio: 10 — ABNORMAL LOW (ref 12–28)
BUN: 10 mg/dL (ref 8–27)
Bilirubin Total: 0.4 mg/dL (ref 0.0–1.2)
CO2: 28 mmol/L (ref 20–29)
Calcium: 9.9 mg/dL (ref 8.7–10.3)
Chloride: 97 mmol/L (ref 96–106)
Creatinine, Ser: 1.01 mg/dL — ABNORMAL HIGH (ref 0.57–1.00)
Globulin, Total: 2.6 g/dL (ref 1.5–4.5)
Glucose: 100 mg/dL — ABNORMAL HIGH (ref 65–99)
Potassium: 4.7 mmol/L (ref 3.5–5.2)
Sodium: 139 mmol/L (ref 134–144)
Total Protein: 6.8 g/dL (ref 6.0–8.5)
eGFR: 62 mL/min/{1.73_m2} (ref >59)

## 2021-04-18 LAB — CBC WITH DIFFERENTIAL/PLATELET
Basophils Absolute: 0 10*3/uL (ref 0.0–0.2)
Basos: 0 %
EOS (ABSOLUTE): 0.2 10*3/uL (ref 0.0–0.4)
Eos: 1 %
Hematocrit: 44.9 % (ref 34.0–46.6)
Hemoglobin: 13.6 g/dL (ref 11.1–15.9)
Immature Grans (Abs): 0.1 10*3/uL (ref 0.0–0.1)
Immature Granulocytes: 1 %
Lymphocytes Absolute: 1.1 10*3/uL (ref 0.7–3.1)
Lymphs: 8 %
MCH: 23.8 pg — ABNORMAL LOW (ref 26.6–33.0)
MCHC: 30.3 g/dL — ABNORMAL LOW (ref 31.5–35.7)
MCV: 79 fL (ref 79–97)
Monocytes Absolute: 0.6 10*3/uL (ref 0.1–0.9)
Monocytes: 5 %
Neutrophils Absolute: 11.7 10*3/uL — ABNORMAL HIGH (ref 1.4–7.0)
Neutrophils: 85 %
Platelets: 295 10*3/uL (ref 150–450)
RBC: 5.72 x10E6/uL — ABNORMAL HIGH (ref 3.77–5.28)
RDW: 17.2 % — ABNORMAL HIGH (ref 11.7–15.4)
WBC: 13.7 10*3/uL — ABNORMAL HIGH (ref 3.4–10.8)

## 2021-04-18 NOTE — Telephone Encounter (Signed)
Pt wanted to let MMM know that she called mail order to cancel amLODipine (NORVASC) 10 MG tablet rx. She cannot take it because she is allergic to it.

## 2021-05-01 ENCOUNTER — Telehealth: Payer: Medicare HMO

## 2021-05-15 ENCOUNTER — Other Ambulatory Visit: Payer: Self-pay

## 2021-05-15 ENCOUNTER — Ambulatory Visit: Payer: Medicare HMO

## 2021-05-15 VITALS — BP 158/72 | HR 87

## 2021-05-15 DIAGNOSIS — I10 Essential (primary) hypertension: Secondary | ICD-10-CM

## 2021-05-15 NOTE — Progress Notes (Signed)
Pt come in office feeling shaky, states she was in [place] shopping and got bp checked showed 184/70. Come to our office right after and I got bp reading of 158/72 and she states this is very normal for her and that she was starting to feel better. She has bp machine at home so I gave her a bp log and told her to keep record of readings. Told her if week day and she gets high bp to give [place] a call if its the weekend she might need to go to er.

## 2021-05-28 DIAGNOSIS — H5213 Myopia, bilateral: Secondary | ICD-10-CM | POA: Diagnosis not present

## 2021-05-29 ENCOUNTER — Telehealth: Payer: Medicare HMO

## 2021-05-30 ENCOUNTER — Telehealth: Payer: Self-pay | Admitting: Nurse Practitioner

## 2021-05-30 MED ORDER — CLONIDINE HCL 0.1 MG PO TABS: 0.1000 mg | ORAL_TABLET | Freq: Two times a day (BID) | ORAL | 2 refills | Status: DC

## 2021-05-30 NOTE — Telephone Encounter (Signed)
What has her blood pressure been running at home.

## 2021-05-30 NOTE — Telephone Encounter (Signed)
Patient states they fluctuates its around 184/107, 151/94.

## 2021-05-30 NOTE — Telephone Encounter (Signed)
Patient notified that we are added clonnidine 0.1mg  BID . Prescription sent to [facility]. Patient is aware. She was told to keep checkof blood pressure at home.

## 2021-06-12 ENCOUNTER — Telehealth: Payer: Self-pay | Admitting: Cardiology

## 2021-06-12 DIAGNOSIS — I25118 Atherosclerotic heart disease of native coronary artery with other forms of angina pectoris: Secondary | ICD-10-CM

## 2021-06-12 DIAGNOSIS — R600 Localized edema: Secondary | ICD-10-CM

## 2021-06-12 DIAGNOSIS — Z79899 Other long term (current) drug therapy: Secondary | ICD-10-CM

## 2021-06-12 DIAGNOSIS — I10 Essential (primary) hypertension: Secondary | ICD-10-CM

## 2021-06-12 MED ORDER — FUROSEMIDE 40 MG PO TABS: 40.0000 mg | ORAL_TABLET | Freq: Two times a day (BID) | ORAL | 1 refills | Status: DC

## 2021-06-12 NOTE — Telephone Encounter (Signed)
Pt c/o BP issue: STAT if pt c/o blurred vision, one-sided weakness or slurred speech  1. What are your last 5 BP readings?  06/12/21 6:50 AM 158/100 11:10 AM 164/91 06/11/21 6:26 AM 144/87 9:08 AM 168/99  5:32 PM 165/101 HR 107 6:26 PM 165/88  7:58 PM 161/80  2. Are you having any other symptoms (ex. Dizziness, headache, blurred vision, passed out)? Swelling, SOB especially with exertion, Occasional headaches   3. What is your BP issue? Hypertension since October. Has readings starting from 05/15/21   Pt c/o swelling: STAT is pt has developed SOB within 24 hours  If swelling, where is the swelling located? Feet and ankles   How much weight have you gained and in what time span? Doesn't know   Have you gained 3 pounds in a day or 5 pounds in a week? Not sure   Do you have a log of your daily weights (if so, list)? No   Are you currently taking a fluid pill? Yes  Are you currently SOB? No  Have you traveled recently? No   Stopped taking amlodipine beginning of October PCP started her on clonidine 05/30/21.  Has not noticed a difference since starting clonidine.

## 2021-06-12 NOTE — Telephone Encounter (Signed)
Pt called in with complaints of elevated BP readings (recordings listed in this message) and intermittent sob/doe since early Oct.  Pt states she went in to see her PCP about this back in Oct, where her amlodipine was discontinued and she was started on clonidine at that time.  Pt states her amlodipine was D/C'ed because pt was complaining of bilateral lower extremity edema.  She was then switched to clonidine instead, to assist with elevated pressures.  Pt states with stop of amlodipine, her swelling did go down within the week, but is now returning.  Pt states she does have mild bilateral lower extremity edema at this present time.  She confirmed she is taking all her other cardiac meds as prescribed, including lasix. Pt states along with her elevated pressures, she has occasional sob and doe.  She also states she feels fatigued since all this has occurred.  Pt denies any chest pain, palpitations, dizziness, headache, blurred vision, N/V, diaphoresis, pre-syncopal or syncopal episodes.   Pt states that the clonidine has not assisted much with her pressures, and she would like to get in with Dr. Johney Frame to address this issue.  Pt does not weigh herself everyday.  Scheduled the pt to come into the clinic and see Dr. Johney Frame on next Monday 11/21 at 0930.  She is aware to arrive 15 mins prior to this appt.  Advised the pt to limit her salt intake.  Advised her to go ahead and start wearing her compression stockings during the day, and elevate her extremities at rest.  Advised her to continue her current med regimen, recording her pressures and bring those into her visit on Monday.  ED precautions provided to the pt if symptoms were to worsen or persist between now and her appt with Dr. Johney Frame on Monday. Informed the pt that I will route this communication to Dr. Johney Frame to make her aware of this plan, and advise if anything needed in the interim. Pt verbalized understanding and agrees with  this plan.

## 2021-06-12 NOTE — Telephone Encounter (Signed)
Dynastee, Brumm[place] A - 06/12/2021 11:44 AM [name], MD  Sent: Thu June 12, 2021  5:02 PM  To: Nuala Alpha, LPN          Message  Thank you so much for letting me know. Let's increase her lasix to 40mg  PO BID until she comes and sees me on Monday. Can we get a BMET, Mg and BNP when she comes as well?        Pt is aware that per Dr. Johney Frame, she would like to increase her lasix to 40 mg po bid, until she comes into the clinic to see her Monday 11/21.  Also informed the pt that we will draw labs on her on Monday 11/21 as well, to check BMET, Mg level, and PRO-BNP.  Lab appt scheduled same day as OV on 11/21.  Pt aware to stop by lab prior to the visit, or after the visit.  Confirmed the pharmacy of choice with the pt.  Pt states she will use her current supply of lasix 40 mg she has on hand and will call the pharmacy when ready to refill.  Note placed to pharmacy about this.  Pt verbalized understanding and agrees with this plan.

## 2021-06-13 NOTE — Progress Notes (Signed)
Cardiology Office Note:    Date:  06/16/2021   ID:  [name], DOB 20-Jul-1956, MRN [id number]  PCP:  [name], [place]  Cardiologist:  [name], MD  Advanced Practice Provider:  No care team member to display Electrophysiologist:  None   Referring MD: [name], *    History of Present Illness:    [name] is a 65 y.o. female with a hx of anxiety, breast cancer, CAD with NSTEMI in 2008, HTN, HLD, DMII, bilateral carotid stenosis, COPD and depression who presents to clinic for follow-up of her CAD.  Last saw Dr. [name] in 06/2011. Patient underwent coronary stenting in 2000 a with a bare-metal stent in the right coronary artery. The patient had a preoperative stress test in 2012 for a lumpectomy. This demonstrated inferolateral ischemia and she was referred for cardiac catheterization, which showed total occlusion of her right coronary artery with left to right collaterals. There was also notation of chronic occlusion of the left circumflex which was present on her previous study in 2008. She had mild-to-moderate disease in the LAD. At her visit in 2012, she had mild anginal symptoms and was managed medically.  During visit on 09/2020, she felt more fatigued with decreased exercise capacity recently, worsening dyspnea on exertion, and mild chest discomfort. She underwent LHC on 10/07/20 which showed stable coronary anatomy with CTO of RCA recommended for continued medical management.  During last visit on 12/05/20, she was continuing to feel tired and was worried about suffering from depression. SOB unchanged. TTE 02/05/21 with LVEF 60-65%, G1DD, normal PASP, aortic sclerosis, RAP 3.  Called clinic on 06/12/21 for worsening dyspnea on exertion, elevated blood pressure, and mild LE edema prompting her visit here today. Prior to her visit, we increased her lasix to 26m BID.  Today, the patient states her  blood pressure has been spiking at home as high as 190 at home over the past month. Was started on clonidine 0.175mBID by her PCP. She noted some improvement but continued to have spikes during the day. When she called Cardiology clinic, we icnreased her lasix to 4024mID and told her to avoid sodium. Since that, he blood pressures have improved to 120-130s and her abdominal bloating has resolved. She continues to have significant dyspnea on exertion, however. Notably, she is a current smoker (has been trying to cut back) and smoked about 1ppd. Her O2 sat here while at rest is 89%. She has not followed regularly with pulm. Denies chest pain, lightheadedness or dizziness.   Past Medical History:  Diagnosis Date   Anxiety    [place]ast [place] ([facility]   inv ductal, ER/PR +, her-2 -   CAD (coronary artery disease)    NSTEMI 4/08: OM3 occluded (PCI unsuccessful), dRCA 95% => BMS, inf HK, EF 50%;   b. MV 11/12:  IL ischemia => c.  [facility]/12:  dLAD 70%, OM1 70-80%, then occluded (no change from 2008), dOM filled L->L collats, mRCA stent occluded, dRCA filled L->R collats, EF 55-65% => med Rx (consider PCI of RCA if refractory angina)   Carotid stenosis    dopplers 5/08: 0-30% bilateral; 10/12: 0-39% B/L ICA => f/u 04/2013   Chronic kidney disease    COPD (chronic obstructive pulmonary disease) (HCC)    Depression    Depression with anxiety    GERD (gastroesophageal reflux disease)    OTC acid reducer prn   Headache(784.0)  sinus; occ. migraines   Hx of radiation therapy 08/24/11 to 10/07/11   L breast   Hyperlipidemia    Hypertension    under control; has been on med. x 4 yrs.   Myocardial infarction ([place])    Stable angina ([place])    a. med Rx after cath 05/2011   Stress incontinence, female     Past Surgical History:  Procedure Laterality Date   AXILLARY LYMPH NODE DISSECTION  07/31/2011   Procedure: AXILLARY LYMPH NODE DISSECTION;  Surgeon: [name], MD;  Location: [facility];  Service: General;  Laterality: Left;  left axillary sentinal node biopsy   BREAST LUMPECTOMY Left 06/30/2011   BREAST SURGERY     CARDIAC CATHETERIZATION  11/02/2006; 06/03/2011   CESAREAN SECTION  1987   CORONARY STENT PLACEMENT  11/02/2006   ELBOW SURGERY     right   LEFT HEART CATH AND CORONARY ANGIOGRAPHY N/A 10/07/2020   Procedure: LEFT HEART CATH AND CORONARY ANGIOGRAPHY;  Surgeon: [name], MD;  Location: [facility];  Service: Cardiovascular;  Laterality: N/A;    Current Medications: Current Meds  Medication Sig   albuterol (PROVENTIL HFA;VENTOLIN HFA) 108 (90 Base) MCG/ACT inhaler Inhale 2 puffs into the lungs every 6 (six) hours as needed for wheezing or shortness of breath.   Alcohol Swabs (B-D SINGLE USE SWABS REGULAR) PADS check blood sugars twice daily Dx E11.9   ALPRAZolam (XANAX) 1 MG tablet Take 1 tablet (1 mg total) by mouth 3 (three) times daily as needed for anxiety.   atorvastatin (LIPITOR) 80 MG tablet Take 1 tablet (80 mg total) by mouth at bedtime.   Blood Glucose Calibration (TRUE METRIX LEVEL 1) Low SOLN Use with glucose monitor Dx E11.9   Blood Glucose Monitoring Suppl (TRUE METRIX AIR GLUCOSE METER) w/Device KIT check blood sugars twice daily Dx E11.9   budesonide-formoterol (SYMBICORT) 160-4.5 MCG/ACT inhaler Inhale 2 puffs into the lungs 2 (two) times daily.   carvedilol (COREG) 6.25 MG tablet Take 1 tablet (6.25 mg total) by mouth 2 (two) times daily.   Cholecalciferol (VITAMIN D3) 5000 units TABS Take 10,000 Units by mouth every evening.   escitalopram (LEXAPRO) 20 MG tablet Take 1 tablet (20 mg total) by mouth daily.   fish oil-omega-3 fatty acids 1000 MG capsule Take 1,000 mg by mouth at bedtime.   furosemide (LASIX) 40 MG tablet Take 1 tablet (40 mg total) by mouth daily.   glucose blood (TRUE METRIX BLOOD GLUCOSE TEST) test strip Use to check blood sugars twice daily Dx E11.9   isosorbide mononitrate (IMDUR) 30 MG 24 hr tablet  Take 0.5 tablets (15 mg total) by mouth daily.   lisinopril (ZESTRIL) 40 MG tablet Take 1 tablet (40 mg total) by mouth daily.   metFORMIN (GLUCOPHAGE) 500 MG tablet Take 1 tablet (500 mg total) by mouth 2 (two) times daily with a meal.   pramipexole (MIRAPEX) 1 MG tablet Take 1 tablet (1 mg total) by mouth in the morning and at bedtime.   spironolactone (ALDACTONE) 25 MG tablet Take 1 tablet (25 mg total) by mouth daily.   TRUEplus Lancets 33G MISC Use to check blood sugars twice daily Dx E11.9   vitamin B-12 (CYANOCOBALAMIN) 1000 MCG tablet Take 1,000 mcg by mouth daily.   [DISCONTINUED] cloNIDine (CATAPRES) 0.1 MG tablet Take 1 tablet (0.1 mg total) by mouth 2 (two) times daily.   [DISCONTINUED] furosemide (LASIX) 40 MG tablet Take 1 tablet (40 mg total) by mouth  2 (two) times daily.   [DISCONTINUED] metoprolol succinate (TOPROL XL) 25 MG 24 hr tablet Take 1 tablet (25 mg total) by mouth daily.     Allergies:   Food, Nutmeg oil (myristica oil), Other, and Amlodipine   Social History   Socioeconomic History   Marital status: Single    Spouse name: Not on file   Number of children: 1   Years of education: 12+   Highest education level: Some college, no degree  Occupational History   Not on file  Tobacco Use   Smoking status: Every Day    Packs/day: 1.00    Years: 45.00    Pack years: 45.00    Types: Cigarettes   Smokeless tobacco: Never   Tobacco comments:    on and off  Vaping Use   Vaping Use: Never used  Substance and Sexual Activity   Alcohol use: Yes    Alcohol/week: 1.0 standard drink    Types: 1 Glasses of wine per week    Comment: occasionally   Drug use: No   Sexual activity: Never    Birth control/protection: Post-menopausal  Other Topics Concern   Not on file  Social History Narrative   Lives at home alone. She has a daughter and grandchildren that she interacts with often.    Social Determinants of Health   Financial Resource Strain: Medium Risk    Difficulty of Paying Living Expenses: Somewhat hard  Food Insecurity: Food Insecurity Present   Worried About [place] in the Last Year: Sometimes true   Ran Out of Food in the Last Year: Sometimes true  Transportation Needs: No Transportation Needs   Lack of Transportation (Medical): No   Lack of Transportation (Non-Medical): No  Physical Activity: Not on file  Stress: Stress Concern Present   Feeling of Stress : Very much  Social Connections: Moderately Isolated   Frequency of Communication with Friends and Family: Twice a week   Frequency of Social Gatherings with Friends and Family: Once a week   Attends Religious Services: 1 to 4 times per year   Active Member of [employer] or Organizations: No   Attends Music therapist: Never   Marital Status: Never married     Family History: The patient's family history includes Anxiety disorder in her daughter; Bipolar disorder in her sister; Cancer in her maternal aunt and maternal grandmother; Depression in her daughter; Heart attack in her sister; Heart disease in her father and mother; Thyroid disease in her daughter.  ROS:   Please see the history of present illness.    Review of Systems  Constitutional:  Positive for malaise/fatigue. Negative for chills and fever.  HENT:  Negative for hearing loss.   Eyes:  Negative for blurred vision and redness.  Respiratory:  Positive for shortness of breath.   Cardiovascular:  Positive for leg swelling. Negative for chest pain, palpitations, orthopnea, claudication and PND.  Gastrointestinal:  Negative for melena, nausea and vomiting.  Genitourinary:  Negative for hematuria.  Musculoskeletal:  Positive for back pain, joint pain and myalgias. Negative for falls.  Neurological:  Positive for headaches. Negative for dizziness and loss of consciousness.  Endo/Heme/Allergies:  Negative for polydipsia.  Psychiatric/Behavioral:  Positive for depression.    EKGs/Labs/Other Studies  Reviewed:    The following studies were reviewed today: TTE 03-Mar-2021: IMPRESSIONS   1. Left ventricular ejection fraction, by estimation, is 60 to 65%. The  left ventricle has normal function. The left ventricle has no regional  wall motion abnormalities. There is mild concentric left ventricular  hypertrophy. Left ventricular diastolic  parameters are consistent with Grade I diastolic dysfunction (impaired  relaxation). Elevated left atrial pressure.   2. Right ventricular systolic function is normal. The right ventricular  size is normal. There is normal pulmonary artery systolic pressure. The  estimated right ventricular systolic pressure is 44.0 mmHg.   3. The mitral valve is normal in structure. Trivial mitral valve  regurgitation.   4. The aortic valve is tricuspid. There is mild calcification of the  aortic valve. There is mild thickening of the aortic valve. Aortic valve  regurgitation is not visualized. Mild to moderate aortic valve  sclerosis/calcification is present, without any  evidence of aortic stenosis.   5. The inferior vena cava is normal in size with greater than 50%  respiratory variability, suggesting right atrial pressure of 3 mmHg.  Cath 10/07/20: 1.  Patent left mainstem 2.  Patent LAD, supplies a large territory with 2 diagonal branches and wraps around the LV apex with no obstructive disease throughout its distribution 3.  Patent left circumflex with chronic occlusion of the first OM branch, late filling is seen in previous cardiac catheterization study 4.  Chronic occlusion of the mid RCA with extensive left-to-right collaterals supplied by an epicardial circumflex branch and septal perforating branches of the LAD 5.  Normal LV function with normal wall motion, no regional wall motion abnormalities.   Recommendations: Favor ongoing medical therapy.  Appears to have stable coronary anatomy from old catheterization 10 years ago.  Will ask CTO specialist to  review her films to see if there would be treatment options for refractory angina.  She is already on aggressive medical therapy with amlodipine, isosorbide, and metoprolol.   Carotid doppler 08/25/18 IMPRESSION: Minor carotid atherosclerosis. No hemodynamically significant ICA stenosis. Degree of narrowing less than 50% bilaterally by ultrasound criteria.   Patent antegrade vertebral flow bilaterally  Cath 2008: NSTEMI 10/2006: pLAD 30%, pCFX 40%, OM3 occluded (PCI unsuccessful), mRCA 50%, dRCA 95% treated with BMS, inf HK, EF 50%.  Cath 2012: FINDINGS:  Aortic pressure is 164/80 with a mean of 116, left  ventricular pressure 165/6 with an end-diastolic pressure of 22.  There  is no aortic stenosis.    The left mainstem is angiographically normal.  It trifurcates into the  LAD, left circumflex and ramus intermedius.  The LAD has mild  nonobstructive luminal irregularity in its proximal portion of no  greater than 30%.  The LAD gives off a large first diagonal branch.  The  mid and distal LAD have minor luminal irregularities, but no significant  angiographic disease.  The LAD also gives off a medium-size second  diagonal branch.    The left circumflex system is of medium caliber.  The proximal portion  of the left circumflex has a 40% stenosis.  The left circumflex gives  off a small intermediate branch in a second OM territory and there is a  third OM that is subtotally occluded and fills late likely from  antegrade flow.  The true AV groove circumflex has diffuse  nonobstructive disease.    The right coronary artery has a high anterior origin.  The midportion of  the vessel has a 50% stenosis and the distal portion of the vessel has a  95% irregular stenosis.  It gives off a large PDA branch and a small  posterolateral branch.  There is TIMI III flow throughout the vessel.    Left ventriculography performed  both in the RAO and LAO projection  demonstrates basal to mid  inferior segment of severe hypokinesis.  Other  LV segments are normal.  The left ventricular ejection fraction is 50%.  There is no mitral regurgitation.    ASSESSMENT: 1. Two-vessel coronary artery disease with total occlusion of a small      branch of the left circumflex and high-grade focal stenosis of the      right coronary artery.  2. Mild segmental left ventricular dysfunction.    PLAN:  As detailed above, PCI of the left circumflex branch was  attempted, but I was unable to cross the lesion with a wire.  The right  coronary artery which is likely the patient's culprit vessel was  intervened upon as described with one bare metal stent.  The patient  should continue on aspirin and clopidogrel for 30 days.  Will also place  her on a high-dose statin.  Will consult financial services or the case  manager to help with obtaining medications.    EKG:   06/16/21: NSR with anterior infarct, PVC 12/05/2020: NSR, anterior infarct   09/24/2020: NSR with HR 62  Recent Labs: 04/17/2021: ALT 12; BUN 10; Creatinine, Ser 1.01; Hemoglobin 13.6; Platelets 295; Potassium 4.7; Sodium 139  Recent Lipid Panel    Component Value Date/Time   CHOL 146 01/14/2021 1115   CHOL 169 01/18/2013 1052   TRIG 129 01/14/2021 1115   TRIG 230 (H) 01/18/2013 1052   HDL 42 01/14/2021 1115   HDL 34 (L) 01/18/2013 1052   CHOLHDL 3.5 01/14/2021 1115   CHOLHDL 5.9 CALC 05/16/2007 0854   VLDL 47 (H) 05/16/2007 0854   LDLCALC 81 01/14/2021 1115   LDLCALC 89 01/18/2013 1052   LDLDIRECT 100.0 05/16/2007 0854     Physical Exam:    VS:  BP (!) 148/80   Pulse 81   Ht 5' 3"  (1.6 m)   Wt 224 lb 6.4 oz (101.8 kg)   LMP 10/17/2005   SpO2 (!) 89%   BMI 39.75 kg/m     Wt Readings from Last 3 Encounters:  06/16/21 224 lb 6.4 oz (101.8 kg)  04/17/21 225 lb (102.1 kg)  01/14/21 224 lb (101.6 kg)     GEN: Comfortable, NAD HEENT: Normal NECK: No JVD; No carotid bruits CARDIAC: RR, no murmurs RESPIRATORY:   Diminished but clear. No wheezes ABDOMEN: Soft, non-tender, non-distended MUSCULOSKELETAL:  Trace ankle edema SKIN: Warm and dry NEUROLOGIC:  Alert and oriented x 3 PSYCHIATRIC:  Normal affect   ASSESSMENT:    1. SOB (shortness of breath)   2. Coronary artery disease of native artery of native heart with stable angina pectoris ([place])   3. Medication management   4. Primary hypertension   5. Chronic obstructive pulmonary disease, unspecified COPD type ([place])   6. Bilateral lower extremity edema   7. Low oxygen saturation   8. Mixed hyperlipidemia   9. Type 2 diabetes mellitus with other circulatory complication, without long-term current use of insulin ([place])   10. Chronic heart failure with preserved ejection fraction (HCC)    PLAN:    In order of problems listed above:  #Dyspnea on Exertion: Likely multifactorial in the setting of COPD, uncontrolled HTN and HFpEF. Appears euvolemic on exam today after increasing lasix. O2 sats, however, 89% and drop to the mid-80s with ambulation. Suspect she is having progression of underlying COPD and likely may need O2 with ambulation. Will make urgent referral to Pulm. -Refer to pulm for  underlying COPD as patient likely needs O2 with ambulation -Decrease lasix back to 74m daily as appears euvolemic -Manage HTN as below -Discussed the importance of seeking immediate medical care if her symptoms worsen or if she becomes more SOB. She would like to avoid hospitalization if possible so urged her to see PCP ASAP to discuss supplemental O2 if unable to get in with pulm this week. She understands and is amenable to the plan.  #HTN: Elevated at home running as high as 190s at home. Likely contributing to SOB. ? If hypoxia is making HTN worse. Will adjust medications as below. -Stop clonidine given risk of rebound HTN -Cannot tolerate amlodipine due to LE swelling -Change metoprolol to coreg 6.220mBID -Start spironolactone 2522maily -Continue  lisinopril 22m29mily -Continue imdur 15mg78mly (has HA so will be careful with uptitration) -Lasix 22mg 19my -Low Na diet -Refer to HTN clinic  #HFpEF: Last TTE 01/2021 with EF 60-65%, G1DD, trivial MR. Was having worsening LE edema and abdominal bloating for which we increased her lasix over the weekend. Currently, appears euvolemic. Has dyspnea as detailed above which I am concerned is due to underlying COPD and uncontrolled HTN rather than HFpEF as this time. -Change lasix back to 22mg d14m -Check BNP and BMET today -Continue lisinopril 22mg da57m-Start spiro as above -Low Na diet -Compression socks  #Multivessel CAD: #Chronic angina S/p BMS in 2012 to RCA, known occlusion of OM, mild-to-moderate LAD disease. Presented to clinic in 09/2020 with recurrent chest pain similar to her anginal equivalent. She underwent repeat coronary angiography on 10/07/20 which showed stable CAD with chronic CTO of RCA and OM with collateral flow and no new lesions. Recommended for continued medical management.  -Continue ASA 81mg dai43m-Continue atorvastatin 80mg dail4montinue imdur 15mg daily48mntinue lisinopril 22mg daily 70mrt spironolactone 25mg daily -37mge metop to coreg 6.25mg BID  #HL37mDL 81. Goal <70 -Continue atorva 80mg daily -If11mvated on next check, will start Zetia  #DMII: -Continue metformin 500mg BID  #COPD68mven hypoxia, concerned about worsening COPD. Continues to smoke although she is very motivated to cut back and has already started doing so. Will refer to Pulm at this time. Likely needs O2 with ambulation. -Refer to Pulm    Medication Adjustments/Labs and Tests Ordered: Current medicines are reviewed at length with the patient today.  Concerns regarding medicines are outlined above.  Orders Placed This Encounter  Procedures   [facility]   EKG 12-Lead   Meds ordered this encounter   Medications   furosemide (LASIX) 40 MG tablet    Sig: Take 1 tablet (40 mg total) by mouth daily.    Dispense:  90 tablet    Refill:  1    Dose decrease   spironolactone (ALDACTONE) 25 MG tablet    Sig: Take 1 tablet (25 mg total) by mouth daily.    Dispense:  90 tablet    Refill:  2   carvedilol (COREG) 6.25 MG tablet    Sig: Take 1 tablet (6.25 mg total) by mouth 2 (two) times daily.    Dispense:  180 tablet    Refill:  3    Patient Instructions  Medication Instructions:   STOP TAKING CLONIDINE WITH YOUR LAST DOSE TONIGHT--STOP THEREAFTER  STOP TAKING METOPROLOL SUCCINATE NOW  START TAKING SPIRONOLACTONE (ALDACTONE) 25 MG BY MOUTH DAILY  START TAKING CARVEDILOL (COREG) 6.25 MG BY MOUTH TWICE DAILY  DECREASE  YOUR LASIX BACK TO 40 MG BY MOUTH DAILY   *If you need a refill on your cardiac medications before your next appointment, please call your pharmacy*   You have been referred to [place] COPD, LOW PULSE OX, AND ONGOING SOB  You have been referred to OUR [facility] HERE IN THE OFFICE TO SEE OUR PHARMACIST FOR BP MANAGEMENT--MAKE THIS APPOINTMENT AT VERY NEXT AVAILABLE APPOINTMENT   Follow-Up:  WITH H[facility]ERTENSION CLINIC HERE IN THE OFFICE AT VERY NEXT AVAILABLE APPOINTMENT  :1}     I,Mathew Stumpf,acting as a scribe for [name], MD.,have documented all relevant documentation on the behalf of [name], MD,as directed by  [name], MD while in the presence of [name], MD.  I, [name], MD, have reviewed all documentation for this visit. The documentation on 06/16/21 for the exam, diagnosis, procedures, and orders are all accurate and complete.  Signed, [name], MD  06/16/2021 10:32 AM    [place]

## 2021-06-16 ENCOUNTER — Ambulatory Visit: Payer: Medicare HMO | Admitting: Cardiology

## 2021-06-16 ENCOUNTER — Other Ambulatory Visit: Payer: Self-pay

## 2021-06-16 ENCOUNTER — Encounter: Payer: Self-pay | Admitting: Cardiology

## 2021-06-16 ENCOUNTER — Telehealth: Payer: Self-pay | Admitting: Nurse Practitioner

## 2021-06-16 ENCOUNTER — Other Ambulatory Visit: Payer: Medicare HMO | Admitting: *Deleted

## 2021-06-16 VITALS — BP 148/80 | HR 81 | Ht 63.0 in | Wt 224.4 lb

## 2021-06-16 DIAGNOSIS — I10 Essential (primary) hypertension: Secondary | ICD-10-CM

## 2021-06-16 DIAGNOSIS — R0602 Shortness of breath: Secondary | ICD-10-CM

## 2021-06-16 DIAGNOSIS — R600 Localized edema: Secondary | ICD-10-CM

## 2021-06-16 DIAGNOSIS — E782 Mixed hyperlipidemia: Secondary | ICD-10-CM

## 2021-06-16 DIAGNOSIS — E1159 Type 2 diabetes mellitus with other circulatory complications: Secondary | ICD-10-CM | POA: Diagnosis not present

## 2021-06-16 DIAGNOSIS — I5032 Chronic diastolic (congestive) heart failure: Secondary | ICD-10-CM

## 2021-06-16 DIAGNOSIS — I25118 Atherosclerotic heart disease of native coronary artery with other forms of angina pectoris: Secondary | ICD-10-CM

## 2021-06-16 DIAGNOSIS — R7981 Abnormal blood-gas level: Secondary | ICD-10-CM

## 2021-06-16 DIAGNOSIS — J449 Chronic obstructive pulmonary disease, unspecified: Secondary | ICD-10-CM

## 2021-06-16 DIAGNOSIS — Z79899 Other long term (current) drug therapy: Secondary | ICD-10-CM | POA: Diagnosis not present

## 2021-06-16 MED ORDER — SPIRONOLACTONE 25 MG PO TABS: 25.0000 mg | ORAL_TABLET | Freq: Every day | ORAL | 2 refills | Status: DC

## 2021-06-16 MED ORDER — CARVEDILOL 6.25 MG PO TABS
6.2500 mg | ORAL_TABLET | Freq: Two times a day (BID) | ORAL | 3 refills | Status: DC
Start: 2021-06-16 — End: 2021-10-09

## 2021-06-16 MED ORDER — FUROSEMIDE 40 MG PO TABS
40.0000 mg | ORAL_TABLET | Freq: Every day | ORAL | 1 refills | Status: DC
Start: 2021-06-16 — End: 2021-10-09

## 2021-06-16 NOTE — Telephone Encounter (Signed)
  Prescription Request  06/16/2021  Is this a "Controlled Substance" medicine? no  Have you seen your PCP in the last 2 weeks? 11/042022  If YES, route message to pool  -  If NO, patient needs to be scheduled for appointment.  What is the name of the medication or equipment? Pt went to dr Johney Frame and she wants her to get oxygen.   Have you contacted your pharmacy to request a refill? no   Which pharmacy would you like this sent to? na   Patient notified that their request is being sent to the clinical staff for review and that they should receive a response within 2 business days.

## 2021-06-16 NOTE — Telephone Encounter (Signed)
Called to make patient an appointment for O2 testing. She states that she sees pulmonology tomorrow and if they cannot order it then she will call our office to make an appointment

## 2021-06-16 NOTE — Patient Instructions (Signed)
Medication Instructions:   STOP TAKING CLONIDINE WITH YOUR LAST DOSE TONIGHT--STOP THEREAFTER  STOP TAKING METOPROLOL SUCCINATE NOW  START TAKING SPIRONOLACTONE (ALDACTONE) 25 MG BY MOUTH DAILY  START TAKING CARVEDILOL (COREG) 6.25 MG BY MOUTH TWICE DAILY  DECREASE YOUR LASIX BACK TO 40 MG BY MOUTH DAILY   *If you need a refill on your cardiac medications before your next appointment, please call your pharmacy*   You have been referred to [place] COPD, LOW PULSE OX, AND ONGOING SOB  You have been referred to [place] OFFICE TO SEE OUR PHARMACIST FOR BP MANAGEMENT--MAKE THIS APPOINTMENT AT VERY NEXT AVAILABLE APPOINTMENT   Follow-Up:  WITH [facility]ERTENSION CLINIC HERE IN THE OFFICE AT VERY NEXT AVAILABLE APPOINTMENT  :1}

## 2021-06-17 ENCOUNTER — Ambulatory Visit: Payer: Medicare HMO | Admitting: Pulmonary Disease

## 2021-06-17 ENCOUNTER — Encounter: Payer: Self-pay | Admitting: Pulmonary Disease

## 2021-06-17 VITALS — BP 114/60 | HR 104 | Temp 98.2°F | Ht 63.0 in | Wt 223.2 lb

## 2021-06-17 DIAGNOSIS — J432 Centrilobular emphysema: Secondary | ICD-10-CM | POA: Diagnosis not present

## 2021-06-17 DIAGNOSIS — R5383 Other fatigue: Secondary | ICD-10-CM

## 2021-06-17 DIAGNOSIS — R918 Other nonspecific abnormal finding of lung field: Secondary | ICD-10-CM

## 2021-06-17 DIAGNOSIS — R0683 Snoring: Secondary | ICD-10-CM

## 2021-06-17 DIAGNOSIS — F17210 Nicotine dependence, cigarettes, uncomplicated: Secondary | ICD-10-CM | POA: Diagnosis not present

## 2021-06-17 LAB — PRO B NATRIURETIC PEPTIDE: NT-Pro BNP: 74 pg/mL (ref 0–287)

## 2021-06-17 LAB — BASIC METABOLIC PANEL
BUN/Creatinine Ratio: 20 (ref 12–28)
BUN: 23 mg/dL (ref 8–27)
CO2: 27 mmol/L (ref 20–29)
Calcium: 9.7 mg/dL (ref 8.7–10.3)
Chloride: 95 mmol/L — ABNORMAL LOW (ref 96–106)
Creatinine, Ser: 1.13 mg/dL — ABNORMAL HIGH (ref 0.57–1.00)
Glucose: 114 mg/dL — ABNORMAL HIGH (ref 70–99)
Potassium: 4.5 mmol/L (ref 3.5–5.2)
Sodium: 139 mmol/L (ref 134–144)
eGFR: 54 mL/min/{1.73_m2} — ABNORMAL LOW (ref >59)

## 2021-06-17 LAB — MAGNESIUM: Magnesium: 1.8 mg/dL (ref 1.6–2.3)

## 2021-06-17 MED ORDER — BREZTRI AEROSPHERE 160-9-4.8 MCG/ACT IN AERO: 2.0000 | INHALATION_SPRAY | Freq: Two times a day (BID) | RESPIRATORY_TRACT | 6 refills | Status: DC

## 2021-06-17 MED ORDER — BREZTRI AEROSPHERE 160-9-4.8 MCG/ACT IN AERO: 2.0000 | INHALATION_SPRAY | Freq: Two times a day (BID) | RESPIRATORY_TRACT | 0 refills | Status: DC

## 2021-06-17 NOTE — Patient Instructions (Addendum)
We will give you a spacer to use with the new breztri inhaler  Start breztri inhaler 2 puffs twice daily - rinse mouth out after each use  Use albuterol inhaler as needed 1-2 puffs every 4-6 hours as needed  Recommend using nicotine patches 14mg  to 21mg  daily You can use nicotine lozenges or gum as needed on top of the patches  We will schedule you for a split night sleep study  We will schedule you for follow up in 2 months with pulmonary function tests

## 2021-06-17 NOTE — Progress Notes (Signed)
Synopsis: Referred in November 2022 for shortness of breath by [name], NP  Subjective:   PATIENT ID: [name] GENDER: female DOB: September 19, 1955, MRN: [id number]   HPI  Chief Complaint  Patient presents with   Consult   [name] is a 65 year old woman, daily smoker with history of breast cancer, coronary artery disease, GERD, hypertension and hyperlipidemia who is referred to pulmonary clinic for shortness of breath.   She reports progressive shortness of breath over the past year or two. She has not been sleeping well over this same time period. She has cough with some mucous production and intermittent wheezing. She does report snoring and waking up to go to the bathroom. She is using symbicort 160-4.73mg 2 puffs twice daily and feels like it is getting stuck in her throat.   She is smoking 1 pack per day. She has been smoking for 50 years. She usually smokes 3 cartons per month and has cut back to 2 cartons per month recently.   She is on disability.   Past Medical History:  Diagnosis Date   Anxiety    [place]   inv ductal, ER/PR +, her-2 -   CAD (coronary artery disease)    NSTEMI 4/08: OM3 occluded (PCI unsuccessful), dRCA 95% => BMS, inf HK, EF 50%;   b. MV 11/12:  [place]  [place] 70%, OM1 70-80%, then occluded (no change from 2008), dOM filled L->L collats, mRCA stent occluded, dRCA filled L->R collats, EF 55-65% => med Rx (consider PCI of RCA if refractory angina)   Carotid stenosis    dopplers 5/08: 0-30% bilateral; 10/12: 0-39% B/L ICA => f/u 04/2013   Chronic kidney disease    COPD (chronic obstructive pulmonary disease) (HCC)    Depression    Depression with anxiety    GERD (gastroesophageal reflux disease)    OTC acid reducer prn   Headache(784.0)    sinus; occ. migraines   Hx of radiation therapy 08/24/11 to 10/07/11   L breast   Hyperlipidemia    Hypertension    under control; has been on med. x 4  yrs.   [place]    Stable angina ([place]    a. med Rx after cath 05/2011   Stress incontinence, female      Family History  Problem Relation Age of Onset   Heart disease Mother    Heart disease Father    Cancer Maternal Aunt        pt unaware of what kind   Cancer Maternal Grandmother        ovarian   Heart attack Sister    Thyroid disease Daughter    Anxiety disorder Daughter    Depression Daughter    Bipolar disorder Sister      Social History   Socioeconomic History   Marital status: Single    Spouse name: Not on file   Number of children: 1   Years of education: 12+   Highest education level: Some college, no degree  Occupational History   Not on file  Tobacco Use   Smoking status: Every Day    Packs/day: 1.00    Years: 45.00    Pack years: 45.00    Types: Cigarettes   Smokeless tobacco: Never   Tobacco comments:    on and off  Vaping Use   Vaping Use: Never used  Substance and Sexual Activity   Alcohol use: Yes  Alcohol/week: 1.0 standard drink    Types: 1 Glasses of wine per week    Comment: occasionally   Drug use: No   Sexual activity: Never    Birth control/protection: Post-menopausal  Other Topics Concern   Not on file  Social History Narrative   Lives at home alone. She has a daughter and grandchildren that she interacts with often.    Social Determinants of Health   Financial Resource Strain: Medium Risk   Difficulty of Paying Living Expenses: Somewhat hard  Food Insecurity: Food Insecurity Present   Worried About [place] in the Last Year: Sometimes true   Ran Out of Food in the Last Year: Sometimes true  Transportation Needs: No Transportation Needs   Lack of Transportation (Medical): No   Lack of Transportation (Non-Medical): No  Physical Activity: Not on file  Stress: Stress Concern Present   Feeling of Stress : Very much  Social Connections: Moderately Isolated   Frequency of Communication with  Friends and Family: Twice a week   Frequency of Social Gatherings with Friends and Family: Once a week   Attends Religious Services: 1 to 4 times per year   Active Member of [employer] or Organizations: No   Attends Music therapist: Never   Marital Status: Never married  Human resources officer Violence: Not At Risk   Fear of Current or Ex-Partner: No   Emotionally Abused: No   Physically Abused: No   Sexually Abused: No     Allergies  Allergen Reactions   Food Other (See Comments)    Nutmeg= difficulty breathing   Nutmeg Oil ([place]ristica Oil) Hives   Other Other (See Comments)    Nutmeg= difficulty breathing   Amlodipine Swelling     Outpatient Medications Prior to Visit  Medication Sig Dispense Refill   albuterol (PROVENTIL HFA;VENTOLIN HFA) 108 (90 Base) MCG/ACT inhaler Inhale 2 puffs into the lungs every 6 (six) hours as needed for wheezing or shortness of breath. 1 Inhaler 0   Alcohol Swabs (B-D SINGLE USE SWABS REGULAR) PADS check blood sugars twice daily Dx E11.9 200 each 3   ALPRAZolam (XANAX) 1 MG tablet Take 1 tablet (1 mg total) by mouth 3 (three) times daily as needed for anxiety. 90 tablet 2   atorvastatin (LIPITOR) 80 MG tablet Take 1 tablet (80 mg total) by mouth at bedtime. 90 tablet 1   Blood Glucose Calibration (TRUE METRIX LEVEL 1) Low SOLN Use with glucose monitor Dx E11.9 3 each 0   Blood Glucose Monitoring Suppl (TRUE METRIX AIR GLUCOSE METER) w/Device KIT check blood sugars twice daily Dx E11.9 1 kit 0   carvedilol (COREG) 6.25 MG tablet Take 1 tablet (6.25 mg total) by mouth 2 (two) times daily. 180 tablet 3   Cholecalciferol (VITAMIN D3) 5000 units TABS Take 10,000 Units by mouth every evening.     escitalopram (LEXAPRO) 20 MG tablet Take 1 tablet (20 mg total) by mouth daily. 90 tablet 1   fish oil-omega-3 fatty acids 1000 MG capsule Take 1,000 mg by mouth at bedtime.     furosemide (LASIX) 40 MG tablet Take 1 tablet (40 mg total) by mouth daily. 90 tablet  1   glucose blood (TRUE METRIX BLOOD GLUCOSE TEST) test strip Use to check blood sugars twice daily Dx E11.9 200 each 3   isosorbide mononitrate (IMDUR) 30 MG 24 hr tablet Take 0.5 tablets (15 mg total) by mouth daily. 90 tablet 1   lisinopril (ZESTRIL) 40 MG tablet  Take 1 tablet (40 mg total) by mouth daily. 90 tablet 1   metFORMIN (GLUCOPHAGE) 500 MG tablet Take 1 tablet (500 mg total) by mouth 2 (two) times daily with a meal. 180 tablet 1   pramipexole (MIRAPEX) 1 MG tablet Take 1 tablet (1 mg total) by mouth in the morning and at bedtime. 180 tablet 1   spironolactone (ALDACTONE) 25 MG tablet Take 1 tablet (25 mg total) by mouth daily. 90 tablet 2   TRUEplus Lancets 33G MISC Use to check blood sugars twice daily Dx E11.9 200 each 3   vitamin B-12 (CYANOCOBALAMIN) 1000 MCG tablet Take 1,000 mcg by mouth daily.     budesonide-formoterol (SYMBICORT) 160-4.5 MCG/ACT inhaler Inhale 2 puffs into the lungs 2 (two) times daily. 3 each 1   hydrOXYzine (VISTARIL) 25 MG capsule Take 1 capsule by mouth three times daily as needed (Patient not taking: Reported on 06/16/2021) 30 capsule 1   No facility-administered medications prior to visit.    Review of Systems  Constitutional:  Negative for chills, fever, malaise/fatigue and weight loss.  HENT:  Negative for congestion, sinus pain and sore throat.   Eyes: Negative.   Respiratory:  Positive for cough, sputum production, shortness of breath and wheezing. Negative for hemoptysis.   Cardiovascular:  Negative for chest pain, palpitations, orthopnea, claudication and leg swelling.  Gastrointestinal:  Negative for abdominal pain, heartburn, nausea and vomiting.  Genitourinary: Negative.   Musculoskeletal:  Negative for joint pain and myalgias.  Skin:  Negative for rash.  Neurological:  Negative for weakness.  Endo/Heme/Allergies: Negative.   Psychiatric/Behavioral: Negative.     Objective:   Vitals:   06/17/21 1538  BP: 114/60  Pulse: (!) 104   Temp: 98.2 F (36.8 C)  TempSrc: Oral  SpO2: 95%  Weight: 223 lb 3.2 oz (101.2 kg)  Height: $Remove'5\' 3"'[id number]$  (1.6 m)     Physical Exam Constitutional:      General: She is not in acute distress.    Appearance: She is not ill-appearing.  HENT:     Head: Normocephalic and atraumatic.  Eyes:     General: No scleral icterus.    Conjunctiva/sclera: Conjunctivae normal.     Pupils: Pupils are equal, round, and reactive to light.  Cardiovascular:     Rate and Rhythm: Normal rate and regular rhythm.     Pulses: Normal pulses.     Heart sounds: Normal heart sounds. No murmur heard. Pulmonary:     Effort: Pulmonary effort is normal.     Breath sounds: Decreased air movement present. Decreased breath sounds present. No wheezing, rhonchi or rales.  Abdominal:     General: Bowel sounds are normal.     Palpations: Abdomen is soft.  Musculoskeletal:     Right lower leg: No edema.     Left lower leg: No edema.  Lymphadenopathy:     Cervical: No cervical adenopathy.  Skin:    General: Skin is warm and dry.  Neurological:     General: No focal deficit present.     Mental Status: She is alert.  Psychiatric:        Mood and Affect: Mood normal.        Behavior: Behavior normal.        Thought Content: Thought content normal.        Judgment: Judgment normal.   CBC    Component Value Date/Time   WBC 13.7 (H) 04/17/2021 1429   WBC 12.3 (H) 12/16/2011 1146   WBC  12.7 (H) 06/24/2011 1322   RBC 5.72 (H) 04/17/2021 1429   RBC 5.17 12/16/2011 1146   RBC 5.42 (H) 06/24/2011 1322   HGB 13.6 04/17/2021 1429   HGB 16.8 (H) 12/16/2011 1146   HCT 44.9 04/17/2021 1429   HCT 49.3 (H) 12/16/2011 1146   PLT 295 04/17/2021 1429   MCV 79 04/17/2021 1429   MCV 95.4 12/16/2011 1146   MCH 23.8 (L) 04/17/2021 1429   MCH 32.5 12/16/2011 1146   MCH 31.9 06/24/2011 1322   MCHC 30.3 (L) 04/17/2021 1429   MCHC 34.1 12/16/2011 1146   MCHC 33.3 06/24/2011 1322   RDW 17.2 (H) 04/17/2021 1429   RDW 14.2  12/16/2011 1146   LYMPHSABS 1.1 04/17/2021 1429   LYMPHSABS 1.0 12/16/2011 1146   MONOABS 0.6 12/16/2011 1146   EOSABS 0.2 04/17/2021 1429   BASOSABS 0.0 04/17/2021 1429   BASOSABS 0.1 12/16/2011 1146   BMP Latest Ref Rng & Units 06/16/2021 04/17/2021 01/14/2021  Glucose 70 - 99 mg/dL 114(H) 100(H) 122(H)  BUN 8 - 27 mg/dL 23 10 7(L)  Creatinine 0.57 - 1.00 mg/dL 1.13(H) 1.01(H) 0.84  BUN/Creat Ratio 12 - 28 20 10(L) 8(L)  Sodium 134 - 144 mmol/L 139 139 142  Potassium 3.5 - 5.2 mmol/L 4.5 4.7 4.6  Chloride 96 - 106 mmol/L 95(L) 97 101  CO2 20 - 29 mmol/L _0 Calcium 8.7 - 10.3 mg/dL 9.7 9.9 9.8   Chest imaging: CT Chest LCS 01/16/21 1. Lung-RADS 3, probably benign findings. Short-term follow-up in 6 months is recommended with repeat low-dose chest CT without contrast. 6.9 mm apical right upper lobe nodule and 6.3 mm left lower lobe nodule.  2. Aortic atherosclerosis (ICD10-I70.0). Coronary artery calcification. 3.  Emphysema   PFT: No flowsheet data found.  Labs:  Path:  Echo 02/05/21: LVEF 60 to 65%.  LV diastolic parameters are consistent with grade 1 diastolic dysfunction.  Elevated left atrial pressure.  RV systolic function is normal.  RV size is normal.  PASP estimated 35.3 mmHg.  Mild thickening of aortic valve.  Mild to moderate aortic valve sclerosis/calcification.  Heart Catheterization 10/07/20: 1.  Patent left mainstem 2.  Patent LAD, supplies a large territory with 2 diagonal branches and wraps around the LV apex with no obstructive disease throughout its distribution 3.  Patent left circumflex with chronic occlusion of the first OM branch, late filling is seen in previous cardiac catheterization study 4.  Chronic occlusion of the mid RCA with extensive left-to-right collaterals supplied by an epicardial circumflex branch and septal perforating branches of the LAD 5.  Normal LV function with normal wall motion, no regional wall motion abnormalities.    Recommendations: Favor ongoing medical therapy.  Appears to have stable coronary anatomy from old catheterization 10 years ago.  Will ask CTO specialist to review her films to see if there would be treatment options for refractory angina.  She is already on aggressive medical therapy with amlodipine, isosorbide, and metoprolol.  Assessment & Plan:   Centrilobular emphysema ([place]) - Plan: Budeson-Glycopyrrol-Formoterol (BREZTRI AEROSPHERE) 160-9-4.8 MCG/ACT AERO, Pulmonary Function Test  Fatigue, unspecified type - Plan: Split night study  Snoring - Plan: Split night study  Cigarette smoker - Plan: Ambulatory Referral for Lung Cancer Scre  Lung nodules  Discussion: [name] is a 65 year old woman, daily smoker with history of breast cancer, coronary artery disease, GERD, hypertension and hyperlipidemia who is referred to pulmonary clinic for shortness of breath.   She  has centrilobular emphysema as noted on CT chest scan from 12/2020.  We will transition her from Symbicort inhaler therapy to [facility] inhaler 2 puffs twice daily with spacer.  I highly recommended smoking cessation and working on cutting back even further on her cigarette use.  We will refer her to our lung cancer screening program for further monitoring.  We discussed nicotine replacement therapy with nicotine patches 14 to 21 mg daily along with nicotine lozenges or gum as needed for breakthrough cravings while on the patches.  Given her ongoing fatigue we will schedule her for a split-night sleep study for concern of obstructive sleep apnea given her symptoms of fatigue and snoring.  She is to follow-up in 2 months with pulmonary function testing.  Freda Jackson, MD [facility] Office: [phone]    Current Outpatient Medications:    albuterol (PROVENTIL HFA;VENTOLIN HFA) 108 (90 Base) MCG/ACT inhaler, Inhale 2 puffs into the lungs every 6 (six) hours as needed for wheezing or shortness  of breath., Disp: 1 Inhaler, Rfl: 0   Alcohol Swabs (B-D SINGLE USE SWABS REGULAR) PADS, check blood sugars twice daily Dx E11.9, Disp: 200 each, Rfl: 3   ALPRAZolam (XANAX) 1 MG tablet, Take 1 tablet (1 mg total) by mouth 3 (three) times daily as needed for anxiety., Disp: 90 tablet, Rfl: 2   atorvastatin (LIPITOR) 80 MG tablet, Take 1 tablet (80 mg total) by mouth at bedtime., Disp: 90 tablet, Rfl: 1   Blood Glucose Calibration (TRUE METRIX LEVEL 1) Low SOLN, Use with glucose monitor Dx E11.9, Disp: 3 each, Rfl: 0   Blood Glucose Monitoring Suppl (TRUE METRIX AIR GLUCOSE METER) w/Device KIT, check blood sugars twice daily Dx E11.9, Disp: 1 kit, Rfl: 0   Budeson-Glycopyrrol-Formoterol (BREZTRI AEROSPHERE) 160-9-4.8 MCG/ACT AERO, Inhale 2 puffs into the lungs in the morning and at bedtime., Disp: 10.7 g, Rfl: 6   Budeson-Glycopyrrol-Formoterol (BREZTRI AEROSPHERE) 160-9-4.8 MCG/ACT AERO, Inhale 2 puffs into the lungs in the morning and at bedtime., Disp: 10.7 g, Rfl: 0   carvedilol (COREG) 6.25 MG tablet, Take 1 tablet (6.25 mg total) by mouth 2 (two) times daily., Disp: 180 tablet, Rfl: 3   Cholecalciferol (VITAMIN D3) 5000 units TABS, Take 10,000 Units by mouth every evening., Disp: , Rfl:    escitalopram (LEXAPRO) 20 MG tablet, Take 1 tablet (20 mg total) by mouth daily., Disp: 90 tablet, Rfl: 1   fish oil-omega-3 fatty acids 1000 MG capsule, Take 1,000 mg by mouth at bedtime., Disp: , Rfl:    furosemide (LASIX) 40 MG tablet, Take 1 tablet (40 mg total) by mouth daily., Disp: 90 tablet, Rfl: 1   glucose blood (TRUE METRIX BLOOD GLUCOSE TEST) test strip, Use to check blood sugars twice daily Dx E11.9, Disp: 200 each, Rfl: 3   isosorbide mononitrate (IMDUR) 30 MG 24 hr tablet, Take 0.5 tablets (15 mg total) by mouth daily., Disp: 90 tablet, Rfl: 1   lisinopril (ZESTRIL) 40 MG tablet, Take 1 tablet (40 mg total) by mouth daily., Disp: 90 tablet, Rfl: 1   metFORMIN (GLUCOPHAGE) 500 MG tablet, Take 1  tablet (500 mg total) by mouth 2 (two) times daily with a meal., Disp: 180 tablet, Rfl: 1   pramipexole (MIRAPEX) 1 MG tablet, Take 1 tablet (1 mg total) by mouth in the morning and at bedtime., Disp: 180 tablet, Rfl: 1   spironolactone (ALDACTONE) 25 MG tablet, Take 1 tablet (25 mg total) by mouth daily., Disp: 90 tablet, Rfl: 2  TRUEplus Lancets 33G MISC, Use to check blood sugars twice daily Dx E11.9, Disp: 200 each, Rfl: 3   vitamin B-12 (CYANOCOBALAMIN) 1000 MCG tablet, Take 1,000 mcg by mouth daily., Disp: , Rfl:    hydrOXYzine (VISTARIL) 25 MG capsule, Take 1 capsule by mouth three times daily as needed (Patient not taking: Reported on 06/16/2021), Disp: 30 capsule, Rfl: 1

## 2021-07-04 ENCOUNTER — Encounter: Payer: Self-pay | Admitting: Pulmonary Disease

## 2021-07-04 ENCOUNTER — Telehealth: Payer: Self-pay | Admitting: Pulmonary Disease

## 2021-07-04 DIAGNOSIS — J432 Centrilobular emphysema: Secondary | ICD-10-CM

## 2021-07-04 NOTE — Telephone Encounter (Signed)
I called and spoke with [name] and she stated that her last OV with JD she was walked and told that she did qualify for oxygen.  She was told that someone would be in touch with her to get that set up.  She was calling to check on this as she has not heard from anyone.  JD please advise.  I did not see a mention of this in her OV note.  thanks

## 2021-07-04 NOTE — Telephone Encounter (Signed)
Called and spoke with patient. She verbalized understanding. She is aware that I will place the order for O2. Nothing further needed at time of call.

## 2021-07-09 ENCOUNTER — Ambulatory Visit: Payer: Medicare HMO

## 2021-07-10 ENCOUNTER — Telehealth: Payer: Self-pay | Admitting: Pulmonary Disease

## 2021-07-11 NOTE — Telephone Encounter (Signed)
Authorization information was given to [facility] earlier today.

## 2021-07-13 ENCOUNTER — Encounter: Payer: Medicare HMO | Admitting: Pulmonary Disease

## 2021-07-14 DIAGNOSIS — J432 Centrilobular emphysema: Secondary | ICD-10-CM | POA: Diagnosis not present

## 2021-07-15 ENCOUNTER — Telehealth: Payer: Self-pay | Admitting: Pulmonary Disease

## 2021-07-16 ENCOUNTER — Telehealth: Payer: Self-pay | Admitting: Pulmonary Disease

## 2021-07-16 DIAGNOSIS — J432 Centrilobular emphysema: Secondary | ICD-10-CM

## 2021-07-16 NOTE — Telephone Encounter (Signed)
Called and spoke with pt who states the concentrator that was brought out by [employer] is too large and it is also messing with her electricity. Pt said that she has two heaters in her house that she uses to try to keep her warm and said that the large concentrator has even cut off one of the heaters.  Stated to pt to call [employer] to see if there was a smaller concentrator that they can bring out in place of the large one that they brought that might use less electricity. Stated to pt after speaking with [employer] if she finds out that the one they brought her is the smallest one that they have to call us back and we could further discuss this with Dr. Erin Fulling. Pt verbalized understanding. Nothing further needed at this time.

## 2021-07-16 NOTE — Telephone Encounter (Signed)
Dr. Erin Fulling, please advise on this as due to the issues pt has been having with the concentrator that she has, she does want to have [employer] pick it up. Pt did receive a POC from [employer] that she can still use.  Please route this back to triage pool. Thank you!

## 2021-07-16 NOTE — Telephone Encounter (Signed)
Pt states she saw JD and he rx'd her oxygen for home use and said pt needed POC. Pt states [employer] brought both large concentrator and POC. Pt states the large conentrator is "in the way and dims her lights-uses too much electricity" and extremely loud. Pt wanting JD to send order to pick up home concentrator. Please advise [phone]

## 2021-07-17 NOTE — Telephone Encounter (Signed)
Nothing noted in message. Will close encounter.  

## 2021-07-18 NOTE — Addendum Note (Signed)
Addended by: Lorretta Harp on: 07/18/2021 09:34 AM   Modules accepted: Orders

## 2021-07-18 NOTE — Telephone Encounter (Signed)
Called and spoke with pt letting her know that JD was okay with [place] placing the order to have home concentrator picked up by [employer] and she verbalized understanding. Order has been placed. Nothing further needed.

## 2021-07-22 ENCOUNTER — Telehealth: Payer: Medicare HMO

## 2021-07-22 ENCOUNTER — Other Ambulatory Visit: Payer: Self-pay | Admitting: Nurse Practitioner

## 2021-07-27 DIAGNOSIS — H269 Unspecified cataract: Secondary | ICD-10-CM

## 2021-07-27 HISTORY — DX: Unspecified cataract: H26.9

## 2021-07-29 DIAGNOSIS — H2512 Age-related nuclear cataract, left eye: Secondary | ICD-10-CM | POA: Diagnosis not present

## 2021-07-29 DIAGNOSIS — H18413 Arcus senilis, bilateral: Secondary | ICD-10-CM | POA: Diagnosis not present

## 2021-07-29 DIAGNOSIS — H2513 Age-related nuclear cataract, bilateral: Secondary | ICD-10-CM | POA: Diagnosis not present

## 2021-07-29 DIAGNOSIS — H25043 Posterior subcapsular polar age-related cataract, bilateral: Secondary | ICD-10-CM | POA: Diagnosis not present

## 2021-07-29 DIAGNOSIS — H25013 Cortical age-related cataract, bilateral: Secondary | ICD-10-CM | POA: Diagnosis not present

## 2021-08-14 DIAGNOSIS — J432 Centrilobular emphysema: Secondary | ICD-10-CM | POA: Diagnosis not present

## 2021-08-27 ENCOUNTER — Other Ambulatory Visit: Payer: Self-pay | Admitting: Nurse Practitioner

## 2021-08-27 DIAGNOSIS — F411 Generalized anxiety disorder: Secondary | ICD-10-CM

## 2021-09-01 ENCOUNTER — Other Ambulatory Visit: Payer: Self-pay | Admitting: Nurse Practitioner

## 2021-09-01 ENCOUNTER — Telehealth: Payer: Self-pay | Admitting: Nurse Practitioner

## 2021-09-01 DIAGNOSIS — F411 Generalized anxiety disorder: Secondary | ICD-10-CM

## 2021-09-01 MED ORDER — ALPRAZOLAM 1 MG PO TABS: 1.0000 mg | ORAL_TABLET | Freq: Three times a day (TID) | ORAL | 0 refills | Status: DC | PRN

## 2021-09-01 NOTE — Telephone Encounter (Signed)
Patient advised that Xanax cannot be refilled outside of an office visit.  Several visits were offered but patient said she does not have a car right now and doesn't know if she can get a ride.  I advised patient to check with her daughter who lives in [place] to find out when she could bring her and call us back so we can scheduled an office visit.

## 2021-09-01 NOTE — Telephone Encounter (Signed)
Patient is about to be out of Xanax. Offered appt for 2/7 but she said that she does not have a car and is unsure of getting a ride. She wanted a message to be sent to [name] to see if she could call in the medicine until her appt next month 3/22. Please call back and advise.

## 2021-09-02 ENCOUNTER — Encounter (HOSPITAL_COMMUNITY): Payer: Self-pay

## 2021-09-02 NOTE — Progress Notes (Signed)
Call placed to patient today regarding follow-up LDCT. Patient reports that she is unable follow-up at this time and requests a call back in march.

## 2021-09-07 ENCOUNTER — Other Ambulatory Visit: Payer: Self-pay | Admitting: Nurse Practitioner

## 2021-09-07 DIAGNOSIS — G2581 Restless legs syndrome: Secondary | ICD-10-CM

## 2021-09-12 ENCOUNTER — Telehealth: Payer: Medicare HMO

## 2021-09-14 DIAGNOSIS — J432 Centrilobular emphysema: Secondary | ICD-10-CM | POA: Diagnosis not present

## 2021-09-19 ENCOUNTER — Other Ambulatory Visit: Payer: Self-pay | Admitting: Cardiology

## 2021-09-19 DIAGNOSIS — R079 Chest pain, unspecified: Secondary | ICD-10-CM

## 2021-09-30 ENCOUNTER — Telehealth: Payer: Self-pay | Admitting: Licensed Clinical Social Worker

## 2021-09-30 ENCOUNTER — Telehealth: Payer: Medicare HMO

## 2021-09-30 NOTE — Telephone Encounter (Signed)
?  Chronic Care Management  ?  ? Clinical Social Work Note ?  ?09/30/21 ?Name: [name]            MRN: [id number]       DOB: 11-27-1955 ?  ?[name] is a 66 y.o. year old female who is a primary care patient of [name], [place]. The CCM team was consulted to assist the patient with chronic disease management and/or care coordination needs related to: [name] .  ?  ?[name] not able to speak via phone with client today. [name] not able to leave phone message for client ? ?Follow Up Date:  11/24/21 at 2:00 PM ? ?[name].Kylei Purington MSW, [name] ?Licensed Clinical Social Worker ?[facility] ?[phone] ?

## 2021-10-04 ENCOUNTER — Other Ambulatory Visit: Payer: Self-pay | Admitting: Nurse Practitioner

## 2021-10-04 DIAGNOSIS — F3342 Major depressive disorder, recurrent, in full remission: Secondary | ICD-10-CM

## 2021-10-04 DIAGNOSIS — E1159 Type 2 diabetes mellitus with other circulatory complications: Secondary | ICD-10-CM

## 2021-10-04 DIAGNOSIS — I10 Essential (primary) hypertension: Secondary | ICD-10-CM

## 2021-10-04 DIAGNOSIS — E7800 Pure hypercholesterolemia, unspecified: Secondary | ICD-10-CM

## 2021-10-09 ENCOUNTER — Ambulatory Visit (INDEPENDENT_AMBULATORY_CARE_PROVIDER_SITE_OTHER): Payer: Medicare HMO | Admitting: Nurse Practitioner

## 2021-10-09 ENCOUNTER — Encounter: Payer: Self-pay | Admitting: Nurse Practitioner

## 2021-10-09 VITALS — BP 102/72 | HR 62 | Temp 97.7°F | Resp 20 | Ht 63.0 in | Wt 218.0 lb

## 2021-10-09 DIAGNOSIS — I10 Essential (primary) hypertension: Secondary | ICD-10-CM | POA: Diagnosis not present

## 2021-10-09 DIAGNOSIS — I2510 Atherosclerotic heart disease of native coronary artery without angina pectoris: Secondary | ICD-10-CM | POA: Diagnosis not present

## 2021-10-09 DIAGNOSIS — F3342 Major depressive disorder, recurrent, in full remission: Secondary | ICD-10-CM | POA: Diagnosis not present

## 2021-10-09 DIAGNOSIS — J449 Chronic obstructive pulmonary disease, unspecified: Secondary | ICD-10-CM | POA: Diagnosis not present

## 2021-10-09 DIAGNOSIS — I6523 Occlusion and stenosis of bilateral carotid arteries: Secondary | ICD-10-CM | POA: Diagnosis not present

## 2021-10-09 DIAGNOSIS — F411 Generalized anxiety disorder: Secondary | ICD-10-CM | POA: Diagnosis not present

## 2021-10-09 DIAGNOSIS — E7800 Pure hypercholesterolemia, unspecified: Secondary | ICD-10-CM | POA: Diagnosis not present

## 2021-10-09 DIAGNOSIS — G2581 Restless legs syndrome: Secondary | ICD-10-CM | POA: Diagnosis not present

## 2021-10-09 DIAGNOSIS — E1159 Type 2 diabetes mellitus with other circulatory complications: Secondary | ICD-10-CM | POA: Diagnosis not present

## 2021-10-09 LAB — BAYER DCA HB A1C WAIVED: HB A1C (BAYER DCA - WAIVED): 6.2 % — ABNORMAL HIGH (ref 4.8–5.6)

## 2021-10-09 MED ORDER — BREZTRI AEROSPHERE 160-9-4.8 MCG/ACT IN AERO: 2.0000 | INHALATION_SPRAY | Freq: Two times a day (BID) | RESPIRATORY_TRACT | 6 refills | Status: DC

## 2021-10-09 MED ORDER — SPIRONOLACTONE 25 MG PO TABS: 25.0000 mg | ORAL_TABLET | Freq: Every day | ORAL | 2 refills | Status: DC

## 2021-10-09 MED ORDER — ALPRAZOLAM 1 MG PO TABS: 1.0000 mg | ORAL_TABLET | Freq: Three times a day (TID) | ORAL | 0 refills | Status: DC | PRN

## 2021-10-09 MED ORDER — ATORVASTATIN CALCIUM 80 MG PO TABS: 80.0000 mg | ORAL_TABLET | Freq: Every day | ORAL | 1 refills | Status: DC

## 2021-10-09 MED ORDER — ESCITALOPRAM OXALATE 20 MG PO TABS: 20.0000 mg | ORAL_TABLET | Freq: Every day | ORAL | 1 refills | Status: DC

## 2021-10-09 MED ORDER — PRAMIPEXOLE DIHYDROCHLORIDE 1 MG PO TABS: 1.0000 mg | ORAL_TABLET | Freq: Two times a day (BID) | ORAL | 0 refills | Status: DC

## 2021-10-09 MED ORDER — CARVEDILOL 6.25 MG PO TABS: 6.2500 mg | ORAL_TABLET | Freq: Two times a day (BID) | ORAL | 3 refills | Status: DC

## 2021-10-09 MED ORDER — LISINOPRIL 40 MG PO TABS: 40.0000 mg | ORAL_TABLET | Freq: Every day | ORAL | 1 refills | Status: DC

## 2021-10-09 MED ORDER — FUROSEMIDE 40 MG PO TABS: 40.0000 mg | ORAL_TABLET | Freq: Every day | ORAL | 1 refills | Status: DC

## 2021-10-09 MED ORDER — ISOSORBIDE MONONITRATE ER 30 MG PO TB24: 15.0000 mg | ORAL_TABLET | Freq: Every day | ORAL | 1 refills | Status: DC

## 2021-10-09 MED ORDER — METFORMIN HCL 500 MG PO TABS: 500.0000 mg | ORAL_TABLET | Freq: Two times a day (BID) | ORAL | 1 refills | Status: DC

## 2021-10-09 NOTE — Progress Notes (Signed)
? ?Subjective:  ? ? Patient ID: [name], female    DOB: November 20, 1955, 66 y.o.   MRN: [id number] ?medical management of chronic issues  ?  ? ?HPI: ? ?[name] is a 66 y.o. who identifies as a female who was assigned female at birth.  ? ?Social history: ?Lives with: by herself ?Work history: retired ? ? ?Comes in today for follow up of the following chronic medical issues: ? ?1. Primary hypertension ?No c/o chest pain, sob or headache. Does not check blood pressure at home. ?BP Readings from Last 3 Encounters:  ?10/09/21 102/72  ?06/17/21 114/60  ?06/16/21 (!) 148/80  ? ? ? ?2. Pure hypercholesterolemia ?Does not watch diet and does no dedicated exercise. ?Lab Results  ?Component Value Date  ? CHOL 146 01/14/2021  ? HDL 42 01/14/2021  ? [place] 81 01/14/2021  ? LDLDIRECT 100.0 05/16/2007  ? TRIG 129 01/14/2021  ? CHOLHDL 3.5 01/14/2021  ?The ASCVD Risk score (Arnett DK, et al., 2019) failed to calculate for the following reasons: ?  The patient has a prior MI or stroke diagnosis ? ? ? ?3. Type 2 diabetes mellitus with other circulatory complication, without long-term current use of insulin ([place]) ?Does not check blood sugar at home. She ha snot been watching her diet ?Lab Results  ?Component Value Date  ? HGBA1C 6.4 (H) 04/17/2021  ? ? ? ?4. Generalized anxiety disorder ?Is on xanax 3x a day and is doing well. ?GAD 7 : Generalized Anxiety Score 10/09/2021 04/17/2021 01/14/2021 09/27/2020  ?Nervous, Anxious, on Edge _0 ?Control/stop worrying _1 ?Worry too much - different things _2 ?Trouble relaxing _3 ?Restless _4 0  ?Easily annoyed or irritable _5 0  ?Afraid - awful might happen _6 0  ?Total GAD 7 Score _7 ?Anxiety Difficulty Very difficult Very difficult Very difficult Not difficult at all  ? ? ? ? ?5. Coronary artery disease due to lipid rich plaque ?Has not seen cardiology in the last year or so. ? ?6. Bilateral carotid artery stenosis ?Last doppler study was done  in 2020 had lees than 50% narrowing at last check. ? ?7. Chronic obstructive pulmonary disease, unspecified COPD type ([place]) ?Has nit seen pulmonology in over a year. She is on breztri daily. Has not needed albuterol as of late ? ?8. Recurrent major depressive disorder, in full remission ([place]) ?Is on lexapro and is doing well. ?Depression screen [facility] 2/9 10/09/2021 04/17/2021 02/11/2021  ?Decreased Interest _8 ?Down, Depressed, Hopeless _9 ?PHQ - 2 Score _10 ?Altered sleeping _11 ?Tired, decreased energy _12 ?Change in appetite _13 ?Feeling bad or failure about yourself  _14 ?Trouble concentrating _15 ?Moving slowly or fidgety/restless _16 ?Suicidal thoughts 1 0 0  ?PHQ-9 Score _17 ?Difficult doing work/chores Extremely dIfficult Very difficult Somewhat difficult  ?Some recent data might be hidden  ? ? ? ?9. Restless leg syndrome ?Is on mirapex and that is working well for her. ? ?10. Vitamin D deficiency ?Is on daily vitamin d supplement ? ?11. Morbid obesity ([place]) ?Weight is down 5lbs ?Wt Readings from Last 3 Encounters:  ?10/09/21 218 lb (98.9 kg)  ?06/17/21 223 lb 3.2 oz (101.2 kg)  ?06/16/21 224  lb 6.4 oz (101.8 kg)  ? ?BMI Readings from Last 3 Encounters:  ?10/09/21 38.62 kg/m?  ?06/17/21 39.54 kg/m?  ?06/16/21 39.75 kg/m?  ? ? ? ? ?New complaints: ?None today ? ?Allergies  ?Allergen Reactions  ? Food Other (See Comments)  ?  Nutmeg= difficulty breathing  ? Nutmeg Oil (Myristica Oil) Hives  ? Other Other (See Comments)  ?  Nutmeg= difficulty breathing  ? Amlodipine Swelling  ? ?Outpatient Encounter Medications as of 10/09/2021  ?Medication Sig  ? albuterol (PROVENTIL HFA;VENTOLIN HFA) 108 (90 Base) MCG/ACT inhaler Inhale 2 puffs into the lungs every 6 (six) hours as needed for wheezing or shortness of breath.  ? Alcohol Swabs (B-D SINGLE USE SWABS REGULAR) PADS check blood sugars twice daily Dx E11.9  ? ALPRAZolam (XANAX) 1 MG tablet Take 1 tablet (1 mg total) by mouth 3 (three)  times daily as needed for anxiety.  ? atorvastatin (LIPITOR) 80 MG tablet TAKE 1 TABLET AT BEDTIME  ? Blood Glucose Calibration (TRUE METRIX LEVEL 1) Low SOLN USE AS DIRECTED  ? Blood Glucose Monitoring Suppl (TRUE METRIX AIR GLUCOSE METER) w/Device KIT check blood sugars twice daily Dx E11.9  ? Budeson-Glycopyrrol-Formoterol (BREZTRI AEROSPHERE) 160-9-4.8 MCG/ACT AERO Inhale 2 puffs into the lungs in the morning and at bedtime.  ? Budeson-Glycopyrrol-Formoterol (BREZTRI AEROSPHERE) 160-9-4.8 MCG/ACT AERO Inhale 2 puffs into the lungs in the morning and at bedtime.  ? carvedilol (COREG) 6.25 MG tablet Take 1 tablet (6.25 mg total) by mouth 2 (two) times daily.  ? Cholecalciferol (VITAMIN D3) 5000 units TABS Take 10,000 Units by mouth every evening.  ? escitalopram (LEXAPRO) 20 MG tablet TAKE 1 TABLET EVERY DAY  ? fish oil-omega-3 fatty acids 1000 MG capsule Take 1,000 mg by mouth at bedtime.  ? furosemide (LASIX) 40 MG tablet Take 1 tablet (40 mg total) by mouth daily.  ? glucose blood (TRUE METRIX BLOOD GLUCOSE TEST) test strip TEST BLOOD SUGAR TWICE DAILY  ? hydrOXYzine (VISTARIL) 25 MG capsule Take 1 capsule by mouth three times daily as needed (Patient not taking: Reported on 06/16/2021)  ? isosorbide mononitrate (IMDUR) 30 MG 24 hr tablet Take 0.5 tablets (15 mg total) by mouth daily.  ? lisinopril (ZESTRIL) 40 MG tablet TAKE 1 TABLET EVERY DAY  ? metFORMIN (GLUCOPHAGE) 500 MG tablet TAKE 1 TABLET TWICE DAILY WITH MEALS  ? pramipexole (MIRAPEX) 1 MG tablet TAKE 1 TABLET (1 MG TOTAL) BY MOUTH IN THE MORNING AND AT BEDTIME.  ? spironolactone (ALDACTONE) 25 MG tablet Take 1 tablet (25 mg total) by mouth daily.  ? TRUEplus Lancets 33G MISC USE TO CHECK BLOOD SUGAR TWICE DAILY  ? vitamin B-12 (CYANOCOBALAMIN) 1000 MCG tablet Take 1,000 mcg by mouth daily.  ? ?No facility-administered encounter medications on file as of 10/09/2021.  ? ? ?Past Surgical History:  ?Procedure Laterality Date  ? AXILLARY LYMPH NODE  DISSECTION  07/31/2011  ? Procedure: AXILLARY LYMPH NODE DISSECTION;  Surgeon: [name], MD;  Location: [facility];  Service: General;  Laterality: Left;  left axillary sentinal node biopsy  ? BREAST LUMPECTOMY Left 06/30/2011  ? BREAST SURGERY    ? CARDIAC CATHETERIZATION  11/02/2006; 06/03/2011  ? [facility]  ? CORONARY STENT PLACEMENT  11/02/2006  ? ELBOW SURGERY    ? right  ? LEFT HEART CATH AND CORONARY ANGIOGRAPHY N/A 10/07/2020  ? Procedure: LEFT HEART CATH AND CORONARY ANGIOGRAPHY;  Surgeon: [name], MD;  Location: [facility];  Service: Cardiovascular;  Laterality: N/A;  ? ? ?Family History  ?Problem Relation Age of Onset  ? Heart disease Mother   ? Heart disease Father   ? Cancer Maternal Aunt   ?     pt unaware of what kind  ? Cancer Maternal Grandmother   ?     ovarian  ? Heart attack Sister   ? Thyroid disease Daughter   ? Anxiety disorder Daughter   ? Depression Daughter   ? Bipolar disorder Sister   ? ? ? ? ?Controlled substance contract: 10/09/21- drug screen ordered ? ? ? ? ?Review of Systems  ?Constitutional:  Negative for diaphoresis.  ?Eyes:  Negative for pain.  ?Respiratory:  Negative for shortness of breath.   ?Cardiovascular:  Negative for chest pain, palpitations and leg swelling.  ?Gastrointestinal:  Negative for abdominal pain.  ?Endocrine: Negative for polydipsia.  ?Skin:  Negative for rash.  ?Neurological:  Negative for dizziness, weakness and headaches.  ?Hematological:  Does not bruise/bleed easily.  ?All other systems reviewed and are negative. ? ?   ?Objective:  ? Physical Exam ?Vitals and nursing note reviewed.  ?Constitutional:   ?   General: She is not in acute distress. ?   Appearance: Normal appearance. She is well-developed.  ?HENT:  ?   Head: Normocephalic.  ?   Right Ear: Tympanic membrane normal.  ?   Left Ear: Tympanic membrane normal.  ?   Nose: Nose normal.  ?   Mouth/Throat:  ?   Mouth: Mucous membranes are moist.  ?Eyes:  ?    Pupils: Pupils are equal, round, and reactive to light.  ?Neck:  ?   Vascular: No carotid bruit or JVD.  ?Cardiovascular:  ?   Rate and Rhythm: Normal rate and regular rhythm.  ?   Heart sounds: Normal heart sounds

## 2021-10-09 NOTE — Patient Instructions (Signed)
Peripheral Edema °Peripheral edema is swelling that is caused by a buildup of fluid. Peripheral edema most often affects the lower legs, ankles, and feet. It can also develop in the arms, hands, and face. The area of the body that has peripheral edema will look swollen. It may also feel heavy or warm. Your clothes may start to feel tight. Pressing on the area may make a temporary dent in your skin. You may not be able to move your swollen arm or leg as much as usual. °There are many causes of peripheral edema. It can happen because of a complication of other conditions such as congestive heart failure, kidney disease, or a problem with your blood circulation. It also can be a side effect of certain medicines or because of an infection. It often happens to women during pregnancy. Sometimes, the cause is not known. °Follow these instructions at home: °Managing pain, stiffness, and swelling ° °Raise (elevate) your legs while you are sitting or lying down. °Move around often to prevent stiffness and to lessen swelling. °Do not sit or stand for long periods of time. °Wear support stockings as told by your health care provider. °Medicines °Take over-the-counter and prescription medicines only as told by your health care provider. °Your health care provider may prescribe medicine to help your body get rid of excess water (diuretic). °General instructions °Pay attention to any changes in your symptoms. °Follow instructions from your health care provider about limiting salt (sodium) in your diet. Sometimes, eating less salt may reduce swelling. °Moisturize skin daily to help prevent skin from cracking and draining. °Keep all follow-up visits as told by your health care provider. This is important. °Contact a health care provider if you have: °A fever. °Edema that starts suddenly or is getting worse, especially if you are pregnant or have a medical condition. °Swelling in only one leg. °Increased swelling, redness, or pain in  one or both of your legs. °Drainage or sores at the area where you have edema. °Get help right away if you: °Develop shortness of breath, especially when you are lying down. °Have pain in your chest or abdomen. °Feel weak. °Feel faint. °Summary °Peripheral edema is swelling that is caused by a buildup of fluid. Peripheral edema most often affects the lower legs, ankles, and feet. °Move around often to prevent stiffness and to lessen swelling. Do not sit or stand for long periods of time. °Pay attention to any changes in your symptoms. °Contact a health care provider if you have edema that starts suddenly or is getting worse, especially if you are pregnant or have a medical condition. °Get help right away if you develop shortness of breath, especially when lying down. °This information is not intended to replace advice given to you by your health care provider. Make sure you discuss any questions you have with your health care provider. °Document Revised: 12/12/2020 Document Reviewed: 04/06/2018 °Elsevier Patient Education © 2022 [employer] Inc. ° °

## 2021-10-10 LAB — CBC WITH DIFFERENTIAL/PLATELET
Basophils Absolute: 0.1 10*3/uL (ref 0.0–0.2)
Basos: 0 %
EOS (ABSOLUTE): 0.2 10*3/uL (ref 0.0–0.4)
Eos: 1 %
Hematocrit: 47.9 % — ABNORMAL HIGH (ref 34.0–46.6)
Hemoglobin: 14.8 g/dL (ref 11.1–15.9)
Immature Grans (Abs): 0 10*3/uL (ref 0.0–0.1)
Immature Granulocytes: 0 %
Lymphocytes Absolute: 0.8 10*3/uL (ref 0.7–3.1)
Lymphs: 6 %
MCH: 25.5 pg — ABNORMAL LOW (ref 26.6–33.0)
MCHC: 30.9 g/dL — ABNORMAL LOW (ref 31.5–35.7)
MCV: 82 fL (ref 79–97)
Monocytes Absolute: 0.6 10*3/uL (ref 0.1–0.9)
Monocytes: 5 %
Neutrophils Absolute: 11.8 10*3/uL — ABNORMAL HIGH (ref 1.4–7.0)
Neutrophils: 88 %
Platelets: 235 10*3/uL (ref 150–450)
RBC: 5.81 x10E6/uL — ABNORMAL HIGH (ref 3.77–5.28)
RDW: 16.4 % — ABNORMAL HIGH (ref 11.7–15.4)
WBC: 13.5 10*3/uL — ABNORMAL HIGH (ref 3.4–10.8)

## 2021-10-10 LAB — CMP14+EGFR
ALT: 11 IU/L (ref 0–32)
AST: 13 IU/L (ref 0–40)
Albumin/Globulin Ratio: 1.6 (ref 1.2–2.2)
Albumin: 4.1 g/dL (ref 3.8–4.8)
Alkaline Phosphatase: 90 IU/L (ref 44–121)
BUN/Creatinine Ratio: 17 (ref 12–28)
BUN: 22 mg/dL (ref 8–27)
Bilirubin Total: 0.4 mg/dL (ref 0.0–1.2)
CO2: 28 mmol/L (ref 20–29)
Calcium: 9.9 mg/dL (ref 8.7–10.3)
Chloride: 98 mmol/L (ref 96–106)
Creatinine, Ser: 1.27 mg/dL — ABNORMAL HIGH (ref 0.57–1.00)
Globulin, Total: 2.5 g/dL (ref 1.5–4.5)
Glucose: 106 mg/dL — ABNORMAL HIGH (ref 70–99)
Potassium: 5 mmol/L (ref 3.5–5.2)
Sodium: 142 mmol/L (ref 134–144)
Total Protein: 6.6 g/dL (ref 6.0–8.5)
eGFR: 47 mL/min/{1.73_m2} — ABNORMAL LOW (ref >59)

## 2021-10-10 LAB — LIPID PANEL
Chol/HDL Ratio: 3.2 ratio (ref 0.0–4.4)
Cholesterol, Total: 90 mg/dL — ABNORMAL LOW (ref 100–199)
HDL: 28 mg/dL — ABNORMAL LOW (ref >39)
LDL Chol Calc (NIH): 43 mg/dL (ref 0–99)
Triglycerides: 99 mg/dL (ref 0–149)
VLDL Cholesterol Cal: 19 mg/dL (ref 5–40)

## 2021-10-12 DIAGNOSIS — J432 Centrilobular emphysema: Secondary | ICD-10-CM | POA: Diagnosis not present

## 2021-10-15 ENCOUNTER — Ambulatory Visit: Payer: Medicare HMO | Admitting: Nurse Practitioner

## 2021-10-22 LAB — DRUG SCREEN 10 W/CONF, SERUM
Amphetamines, IA: NEGATIVE ng/mL
Barbiturates, IA: NEGATIVE ug/mL
Benzodiazepines, IA: POSITIVE ng/mL — AB
Cocaine & Metabolite, IA: NEGATIVE ng/mL
Methadone, IA: NEGATIVE ng/mL
Opiates, IA: NEGATIVE ng/mL
Oxycodones, IA: NEGATIVE ng/mL
Phencyclidine, IA: NEGATIVE ng/mL
Propoxyphene, IA: NEGATIVE ng/mL
THC(Marijuana) Metabolite, IA: POSITIVE ng/mL — AB

## 2021-10-22 LAB — BENZODIAZEPINES,MS,WB/SP RFX
7-Aminoclonazepam: NEGATIVE ng/mL
Alprazolam: 12.4 ng/mL
Benzodiazepines Confirm: POSITIVE
Chlordiazepoxide: NEGATIVE
Clonazepam: NEGATIVE ng/mL
Desalkylflurazepam: NEGATIVE ng/mL
Desmethylchlordiazepoxide: NEGATIVE
Desmethyldiazepam: NEGATIVE ng/mL
Diazepam: NEGATIVE ng/mL
Flurazepam: NEGATIVE ng/mL
Lorazepam: NEGATIVE ng/mL
Midazolam: NEGATIVE ng/mL
Oxazepam: NEGATIVE ng/mL
Temazepam: NEGATIVE ng/mL
Triazolam: NEGATIVE ng/mL

## 2021-10-22 LAB — THC,MS,WB/SP RFX
Cannabidiol: NEGATIVE ng/mL
Cannabinoid Confirmation: POSITIVE
Carboxy-THC: 12.6 ng/mL
Hydroxy-THC: NEGATIVE ng/mL
Tetrahydrocannabinol(THC): NEGATIVE ng/mL

## 2021-11-12 DIAGNOSIS — J432 Centrilobular emphysema: Secondary | ICD-10-CM | POA: Diagnosis not present

## 2021-11-15 ENCOUNTER — Emergency Department (HOSPITAL_COMMUNITY): Payer: Medicare HMO

## 2021-11-15 ENCOUNTER — Inpatient Hospital Stay (HOSPITAL_COMMUNITY)
Admission: EM | Admit: 2021-11-15 | Discharge: 2021-11-27 | DRG: 871 | Disposition: A | Discharge status: Skilled Nursing Facility | Payer: Medicare HMO | Attending: Internal Medicine | Admitting: Internal Medicine

## 2021-11-15 ENCOUNTER — Encounter (HOSPITAL_COMMUNITY): Payer: Self-pay

## 2021-11-15 ENCOUNTER — Other Ambulatory Visit: Payer: Self-pay

## 2021-11-15 DIAGNOSIS — Z4682 Encounter for fitting and adjustment of non-vascular catheter: Secondary | ICD-10-CM | POA: Diagnosis not present

## 2021-11-15 DIAGNOSIS — Z7982 Long term (current) use of aspirin: Secondary | ICD-10-CM

## 2021-11-15 DIAGNOSIS — R0602 Shortness of breath: Secondary | ICD-10-CM

## 2021-11-15 DIAGNOSIS — F17210 Nicotine dependence, cigarettes, uncomplicated: Secondary | ICD-10-CM | POA: Diagnosis present

## 2021-11-15 DIAGNOSIS — E11649 Type 2 diabetes mellitus with hypoglycemia without coma: Secondary | ICD-10-CM | POA: Diagnosis present

## 2021-11-15 DIAGNOSIS — J9602 Acute respiratory failure with hypercapnia: Secondary | ICD-10-CM | POA: Diagnosis not present

## 2021-11-15 DIAGNOSIS — A419 Sepsis, unspecified organism: Principal | ICD-10-CM | POA: Diagnosis present

## 2021-11-15 DIAGNOSIS — I700 Atherosclerosis of aorta: Secondary | ICD-10-CM | POA: Diagnosis not present

## 2021-11-15 DIAGNOSIS — I517 Cardiomegaly: Secondary | ICD-10-CM | POA: Diagnosis not present

## 2021-11-15 DIAGNOSIS — R6521 Severe sepsis with septic shock: Secondary | ICD-10-CM | POA: Diagnosis present

## 2021-11-15 DIAGNOSIS — M6281 Muscle weakness (generalized): Secondary | ICD-10-CM | POA: Diagnosis not present

## 2021-11-15 DIAGNOSIS — G9340 Encephalopathy, unspecified: Secondary | ICD-10-CM

## 2021-11-15 DIAGNOSIS — R41841 Cognitive communication deficit: Secondary | ICD-10-CM | POA: Diagnosis not present

## 2021-11-15 DIAGNOSIS — J9622 Acute and chronic respiratory failure with hypercapnia: Secondary | ICD-10-CM | POA: Diagnosis present

## 2021-11-15 DIAGNOSIS — N179 Acute kidney failure, unspecified: Secondary | ICD-10-CM | POA: Diagnosis not present

## 2021-11-15 DIAGNOSIS — L89312 Pressure ulcer of right buttock, stage 2: Secondary | ICD-10-CM | POA: Diagnosis present

## 2021-11-15 DIAGNOSIS — J9621 Acute and chronic respiratory failure with hypoxia: Secondary | ICD-10-CM | POA: Diagnosis present

## 2021-11-15 DIAGNOSIS — Z79899 Other long term (current) drug therapy: Secondary | ICD-10-CM

## 2021-11-15 DIAGNOSIS — G2581 Restless legs syndrome: Secondary | ICD-10-CM | POA: Diagnosis present

## 2021-11-15 DIAGNOSIS — I5033 Acute on chronic diastolic (congestive) heart failure: Secondary | ICD-10-CM | POA: Diagnosis present

## 2021-11-15 DIAGNOSIS — F419 Anxiety disorder, unspecified: Secondary | ICD-10-CM | POA: Diagnosis present

## 2021-11-15 DIAGNOSIS — F32A Depression, unspecified: Secondary | ICD-10-CM | POA: Diagnosis present

## 2021-11-15 DIAGNOSIS — N2889 Other specified disorders of kidney and ureter: Secondary | ICD-10-CM | POA: Diagnosis not present

## 2021-11-15 DIAGNOSIS — D696 Thrombocytopenia, unspecified: Secondary | ICD-10-CM | POA: Diagnosis present

## 2021-11-15 DIAGNOSIS — R569 Unspecified convulsions: Secondary | ICD-10-CM | POA: Diagnosis not present

## 2021-11-15 DIAGNOSIS — F411 Generalized anxiety disorder: Secondary | ICD-10-CM

## 2021-11-15 DIAGNOSIS — E1122 Type 2 diabetes mellitus with diabetic chronic kidney disease: Secondary | ICD-10-CM | POA: Diagnosis present

## 2021-11-15 DIAGNOSIS — Z888 Allergy status to other drugs, medicaments and biological substances status: Secondary | ICD-10-CM

## 2021-11-15 DIAGNOSIS — E1165 Type 2 diabetes mellitus with hyperglycemia: Secondary | ICD-10-CM | POA: Diagnosis present

## 2021-11-15 DIAGNOSIS — Z7401 Bed confinement status: Secondary | ICD-10-CM | POA: Diagnosis not present

## 2021-11-15 DIAGNOSIS — R0902 Hypoxemia: Secondary | ICD-10-CM | POA: Diagnosis not present

## 2021-11-15 DIAGNOSIS — K7200 Acute and subacute hepatic failure without coma: Secondary | ICD-10-CM | POA: Diagnosis present

## 2021-11-15 DIAGNOSIS — J449 Chronic obstructive pulmonary disease, unspecified: Secondary | ICD-10-CM | POA: Diagnosis not present

## 2021-11-15 DIAGNOSIS — R410 Disorientation, unspecified: Secondary | ICD-10-CM | POA: Diagnosis not present

## 2021-11-15 DIAGNOSIS — J9690 Respiratory failure, unspecified, unspecified whether with hypoxia or hypercapnia: Secondary | ICD-10-CM | POA: Diagnosis not present

## 2021-11-15 DIAGNOSIS — I130 Hypertensive heart and chronic kidney disease with heart failure and stage 1 through stage 4 chronic kidney disease, or unspecified chronic kidney disease: Secondary | ICD-10-CM | POA: Diagnosis present

## 2021-11-15 DIAGNOSIS — R0689 Other abnormalities of breathing: Secondary | ICD-10-CM | POA: Diagnosis not present

## 2021-11-15 DIAGNOSIS — R4182 Altered mental status, unspecified: Secondary | ICD-10-CM | POA: Diagnosis not present

## 2021-11-15 DIAGNOSIS — Z452 Encounter for adjustment and management of vascular access device: Secondary | ICD-10-CM | POA: Diagnosis not present

## 2021-11-15 DIAGNOSIS — Z7984 Long term (current) use of oral hypoglycemic drugs: Secondary | ICD-10-CM

## 2021-11-15 DIAGNOSIS — I10 Essential (primary) hypertension: Secondary | ICD-10-CM

## 2021-11-15 DIAGNOSIS — E041 Nontoxic single thyroid nodule: Secondary | ICD-10-CM | POA: Diagnosis present

## 2021-11-15 DIAGNOSIS — R2681 Unsteadiness on feet: Secondary | ICD-10-CM | POA: Diagnosis not present

## 2021-11-15 DIAGNOSIS — N170 Acute kidney failure with tubular necrosis: Secondary | ICD-10-CM | POA: Diagnosis present

## 2021-11-15 DIAGNOSIS — Z20822 Contact with and (suspected) exposure to covid-19: Secondary | ICD-10-CM | POA: Diagnosis present

## 2021-11-15 DIAGNOSIS — L8990 Pressure ulcer of unspecified site, unspecified stage: Secondary | ICD-10-CM | POA: Insufficient documentation

## 2021-11-15 DIAGNOSIS — N1831 Chronic kidney disease, stage 3a: Secondary | ICD-10-CM | POA: Diagnosis present

## 2021-11-15 DIAGNOSIS — I2510 Atherosclerotic heart disease of native coronary artery without angina pectoris: Secondary | ICD-10-CM | POA: Diagnosis present

## 2021-11-15 DIAGNOSIS — Z8249 Family history of ischemic heart disease and other diseases of the circulatory system: Secondary | ICD-10-CM

## 2021-11-15 DIAGNOSIS — E875 Hyperkalemia: Secondary | ICD-10-CM

## 2021-11-15 DIAGNOSIS — R7981 Abnormal blood-gas level: Secondary | ICD-10-CM

## 2021-11-15 DIAGNOSIS — J439 Emphysema, unspecified: Secondary | ICD-10-CM | POA: Diagnosis not present

## 2021-11-15 DIAGNOSIS — E119 Type 2 diabetes mellitus without complications: Secondary | ICD-10-CM | POA: Diagnosis not present

## 2021-11-15 DIAGNOSIS — Z923 Personal history of irradiation: Secondary | ICD-10-CM

## 2021-11-15 DIAGNOSIS — G4733 Obstructive sleep apnea (adult) (pediatric): Secondary | ICD-10-CM | POA: Diagnosis present

## 2021-11-15 DIAGNOSIS — G9341 Metabolic encephalopathy: Secondary | ICD-10-CM | POA: Diagnosis present

## 2021-11-15 DIAGNOSIS — Z741 Need for assistance with personal care: Secondary | ICD-10-CM | POA: Diagnosis not present

## 2021-11-15 DIAGNOSIS — M2550 Pain in unspecified joint: Secondary | ICD-10-CM | POA: Diagnosis not present

## 2021-11-15 DIAGNOSIS — M4316 Spondylolisthesis, lumbar region: Secondary | ICD-10-CM | POA: Diagnosis not present

## 2021-11-15 DIAGNOSIS — I252 Old myocardial infarction: Secondary | ICD-10-CM

## 2021-11-15 DIAGNOSIS — Z818 Family history of other mental and behavioral disorders: Secondary | ICD-10-CM

## 2021-11-15 DIAGNOSIS — J9601 Acute respiratory failure with hypoxia: Secondary | ICD-10-CM | POA: Diagnosis not present

## 2021-11-15 DIAGNOSIS — R404 Transient alteration of awareness: Secondary | ICD-10-CM | POA: Diagnosis not present

## 2021-11-15 DIAGNOSIS — J189 Pneumonia, unspecified organism: Secondary | ICD-10-CM | POA: Diagnosis present

## 2021-11-15 DIAGNOSIS — I25118 Atherosclerotic heart disease of native coronary artery with other forms of angina pectoris: Secondary | ICD-10-CM

## 2021-11-15 DIAGNOSIS — Z91018 Allergy to other foods: Secondary | ICD-10-CM

## 2021-11-15 DIAGNOSIS — K219 Gastro-esophageal reflux disease without esophagitis: Secondary | ICD-10-CM | POA: Diagnosis present

## 2021-11-15 DIAGNOSIS — Z853 Personal history of malignant neoplasm of breast: Secondary | ICD-10-CM

## 2021-11-15 DIAGNOSIS — Z7951 Long term (current) use of inhaled steroids: Secondary | ICD-10-CM

## 2021-11-15 DIAGNOSIS — E785 Hyperlipidemia, unspecified: Secondary | ICD-10-CM | POA: Diagnosis present

## 2021-11-15 DIAGNOSIS — L89322 Pressure ulcer of left buttock, stage 2: Secondary | ICD-10-CM | POA: Diagnosis present

## 2021-11-15 DIAGNOSIS — Z9861 Coronary angioplasty status: Secondary | ICD-10-CM

## 2021-11-15 DIAGNOSIS — R600 Localized edema: Secondary | ICD-10-CM

## 2021-11-15 MED ORDER — SODIUM CHLORIDE 0.9 % IV BOLUS
1000.0000 mL | Freq: Once | INTRAVENOUS | Status: AC
  Administered 2021-11-15: 1000 mL via INTRAVENOUS

## 2021-11-15 NOTE — ED Triage Notes (Addendum)
Pt BIB [facility] was last seen by family 1 week ago, son talked to her last night on the phone and states she had slurred speech but thought "it was a reception issue". EMS reports family went to check on her today because they couldn't get in touch with her. Pt was sitting on the couch covered in feces from head to toe. Pt alert to self only. Pt unable to answer any questions in triage  ? ? ?Dr. Dina Rich @ bedside ?

## 2021-11-16 ENCOUNTER — Inpatient Hospital Stay (HOSPITAL_COMMUNITY): Payer: Medicare HMO

## 2021-11-16 ENCOUNTER — Emergency Department (HOSPITAL_COMMUNITY): Payer: Medicare HMO

## 2021-11-16 DIAGNOSIS — E11649 Type 2 diabetes mellitus with hypoglycemia without coma: Secondary | ICD-10-CM | POA: Diagnosis present

## 2021-11-16 DIAGNOSIS — J189 Pneumonia, unspecified organism: Secondary | ICD-10-CM | POA: Diagnosis present

## 2021-11-16 DIAGNOSIS — G2581 Restless legs syndrome: Secondary | ICD-10-CM | POA: Diagnosis present

## 2021-11-16 DIAGNOSIS — Z20822 Contact with and (suspected) exposure to covid-19: Secondary | ICD-10-CM | POA: Diagnosis present

## 2021-11-16 DIAGNOSIS — J9622 Acute and chronic respiratory failure with hypercapnia: Secondary | ICD-10-CM | POA: Diagnosis not present

## 2021-11-16 DIAGNOSIS — R6521 Severe sepsis with septic shock: Secondary | ICD-10-CM | POA: Diagnosis present

## 2021-11-16 DIAGNOSIS — E1122 Type 2 diabetes mellitus with diabetic chronic kidney disease: Secondary | ICD-10-CM | POA: Diagnosis present

## 2021-11-16 DIAGNOSIS — N170 Acute kidney failure with tubular necrosis: Secondary | ICD-10-CM | POA: Diagnosis present

## 2021-11-16 DIAGNOSIS — F419 Anxiety disorder, unspecified: Secondary | ICD-10-CM | POA: Diagnosis present

## 2021-11-16 DIAGNOSIS — E875 Hyperkalemia: Secondary | ICD-10-CM | POA: Diagnosis present

## 2021-11-16 DIAGNOSIS — J9601 Acute respiratory failure with hypoxia: Secondary | ICD-10-CM | POA: Diagnosis not present

## 2021-11-16 DIAGNOSIS — N1831 Chronic kidney disease, stage 3a: Secondary | ICD-10-CM | POA: Diagnosis present

## 2021-11-16 DIAGNOSIS — J9602 Acute respiratory failure with hypercapnia: Secondary | ICD-10-CM | POA: Diagnosis not present

## 2021-11-16 DIAGNOSIS — G4733 Obstructive sleep apnea (adult) (pediatric): Secondary | ICD-10-CM | POA: Diagnosis present

## 2021-11-16 DIAGNOSIS — E041 Nontoxic single thyroid nodule: Secondary | ICD-10-CM | POA: Diagnosis present

## 2021-11-16 DIAGNOSIS — D696 Thrombocytopenia, unspecified: Secondary | ICD-10-CM | POA: Diagnosis present

## 2021-11-16 DIAGNOSIS — A419 Sepsis, unspecified organism: Secondary | ICD-10-CM | POA: Diagnosis present

## 2021-11-16 DIAGNOSIS — N179 Acute kidney failure, unspecified: Secondary | ICD-10-CM | POA: Diagnosis not present

## 2021-11-16 DIAGNOSIS — K7200 Acute and subacute hepatic failure without coma: Secondary | ICD-10-CM | POA: Diagnosis present

## 2021-11-16 DIAGNOSIS — F32A Depression, unspecified: Secondary | ICD-10-CM | POA: Diagnosis present

## 2021-11-16 DIAGNOSIS — G9341 Metabolic encephalopathy: Secondary | ICD-10-CM | POA: Diagnosis present

## 2021-11-16 DIAGNOSIS — J449 Chronic obstructive pulmonary disease, unspecified: Secondary | ICD-10-CM | POA: Diagnosis not present

## 2021-11-16 DIAGNOSIS — E1165 Type 2 diabetes mellitus with hyperglycemia: Secondary | ICD-10-CM | POA: Diagnosis present

## 2021-11-16 DIAGNOSIS — J9621 Acute and chronic respiratory failure with hypoxia: Secondary | ICD-10-CM | POA: Diagnosis not present

## 2021-11-16 DIAGNOSIS — I5033 Acute on chronic diastolic (congestive) heart failure: Secondary | ICD-10-CM | POA: Diagnosis present

## 2021-11-16 DIAGNOSIS — I130 Hypertensive heart and chronic kidney disease with heart failure and stage 1 through stage 4 chronic kidney disease, or unspecified chronic kidney disease: Secondary | ICD-10-CM | POA: Diagnosis present

## 2021-11-16 DIAGNOSIS — G9340 Encephalopathy, unspecified: Secondary | ICD-10-CM | POA: Diagnosis not present

## 2021-11-16 DIAGNOSIS — L8990 Pressure ulcer of unspecified site, unspecified stage: Secondary | ICD-10-CM | POA: Insufficient documentation

## 2021-11-16 LAB — GLUCOSE, CAPILLARY
Glucose-Capillary: 129 mg/dL — ABNORMAL HIGH (ref 70–99)
Glucose-Capillary: 133 mg/dL — ABNORMAL HIGH (ref 70–99)
Glucose-Capillary: 85 mg/dL (ref 70–99)
Glucose-Capillary: 112 mg/dL — ABNORMAL HIGH (ref 70–99)

## 2021-11-16 LAB — COMPREHENSIVE METABOLIC PANEL
ALT: 1879 U/L — ABNORMAL HIGH (ref 0–44)
AST: 1863 U/L — ABNORMAL HIGH (ref 15–41)
Albumin: 3.5 g/dL (ref 3.5–5.0)
Alkaline Phosphatase: 99 U/L (ref 38–126)
Anion gap: 11 (ref 5–15)
BUN: 95 mg/dL — ABNORMAL HIGH (ref 8–23)
CO2: 27 mmol/L (ref 22–32)
Calcium: 8.4 mg/dL — ABNORMAL LOW (ref 8.9–10.3)
Chloride: 100 mmol/L (ref 98–111)
Creatinine, Ser: 3.4 mg/dL — ABNORMAL HIGH (ref 0.44–1.00)
GFR, Estimated: 14 mL/min — ABNORMAL LOW (ref >60)
Glucose, Bld: 141 mg/dL — ABNORMAL HIGH (ref 70–99)
Potassium: 7.1 mmol/L (ref 3.5–5.1)
Sodium: 138 mmol/L (ref 135–145)
Total Bilirubin: 1.5 mg/dL — ABNORMAL HIGH (ref 0.3–1.2)
Total Protein: 7.1 g/dL (ref 6.5–8.1)

## 2021-11-16 LAB — BLOOD GAS, ARTERIAL
Acid-base deficit: 3.3 mmol/L — ABNORMAL HIGH (ref 0.0–2.0)
Acid-base deficit: 3.9 mmol/L — ABNORMAL HIGH (ref 0.0–2.0)
Allens test (pass/fail): NO GROWTH — AB
Bicarbonate: 25.4 mmol/L (ref 20.0–28.0)
Bicarbonate: 28.6 mmol/L — ABNORMAL HIGH (ref 20.0–28.0)
Drawn by: 22223
Drawn by: 22223
FIO2: 100 %
FIO2: 80 %
O2 Saturation: 96.2 %
O2 Saturation: 99.5 %
Patient temperature: 35.7
Patient temperature: 36.3
pCO2 arterial: 61 mmHg — ABNORMAL HIGH (ref 32–48)
pCO2 arterial: 87 mmHg (ref 32–48)
pH, Arterial: 7.12 — CL (ref 7.35–7.45)
pH, Arterial: 7.22 — ABNORMAL LOW (ref 7.35–7.45)
pO2, Arterial: 182 mmHg — ABNORMAL HIGH (ref 83–108)
pO2, Arterial: 87 mmHg (ref 83–108)

## 2021-11-16 LAB — DIFFERENTIAL
Abs Immature Granulocytes: 0.07 10*3/uL (ref 0.00–0.07)
Basophils Absolute: 0 10*3/uL (ref 0.0–0.1)
Basophils Relative: 0 %
Eosinophils Absolute: 0 10*3/uL (ref 0.0–0.5)
Eosinophils Relative: 0 %
Immature Granulocytes: 0 %
Lymphocytes Relative: 3 %
Lymphs Abs: 0.6 10*3/uL — ABNORMAL LOW (ref 0.7–4.0)
Monocytes Absolute: 0.7 10*3/uL (ref 0.1–1.0)
Monocytes Relative: 4 %
Neutro Abs: 15.8 10*3/uL — ABNORMAL HIGH (ref 1.7–7.7)
Neutrophils Relative %: 93 %

## 2021-11-16 LAB — URINALYSIS, ROUTINE W REFLEX MICROSCOPIC
Bilirubin Urine: NEGATIVE
Glucose, UA: NEGATIVE mg/dL
Ketones, ur: NEGATIVE mg/dL
Nitrite: NEGATIVE
Protein, ur: 30 mg/dL — AB
Specific Gravity, Urine: 1.016 (ref 1.005–1.030)
pH: 5 (ref 5.0–8.0)

## 2021-11-16 LAB — ETHANOL: Alcohol, Ethyl (B): <10 mg/dL (ref <10)

## 2021-11-16 LAB — BASIC METABOLIC PANEL
Anion gap: 9 (ref 5–15)
Anion gap: 8 (ref 5–15)
BUN: 107 mg/dL — ABNORMAL HIGH (ref 8–23)
BUN: 95 mg/dL — ABNORMAL HIGH (ref 8–23)
CO2: 27 mmol/L (ref 22–32)
CO2: 26 mmol/L (ref 22–32)
Calcium: 8 mg/dL — ABNORMAL LOW (ref 8.9–10.3)
Calcium: 7.9 mg/dL — ABNORMAL LOW (ref 8.9–10.3)
Chloride: 104 mmol/L (ref 98–111)
Chloride: 104 mmol/L (ref 98–111)
Creatinine, Ser: 3.02 mg/dL — ABNORMAL HIGH (ref 0.44–1.00)
Creatinine, Ser: 2.47 mg/dL — ABNORMAL HIGH (ref 0.44–1.00)
GFR, Estimated: 17 mL/min — ABNORMAL LOW (ref >60)
GFR, Estimated: 21 mL/min — ABNORMAL LOW (ref >60)
Glucose, Bld: 144 mg/dL — ABNORMAL HIGH (ref 70–99)
Glucose, Bld: 114 mg/dL — ABNORMAL HIGH (ref 70–99)
Potassium: 5.8 mmol/L — ABNORMAL HIGH (ref 3.5–5.1)
Potassium: 5.1 mmol/L (ref 3.5–5.1)
Sodium: 140 mmol/L (ref 135–145)
Sodium: 138 mmol/L (ref 135–145)

## 2021-11-16 LAB — HEMOGLOBIN A1C
Hgb A1c MFr Bld: 7.3 % — ABNORMAL HIGH (ref 4.8–5.6)
Mean Plasma Glucose: 162.81 mg/dL

## 2021-11-16 LAB — RAPID URINE DRUG SCREEN, HOSP PERFORMED
Amphetamines: NOT DETECTED
Barbiturates: NOT DETECTED
Benzodiazepines: POSITIVE — AB
Cocaine: NOT DETECTED
Opiates: NOT DETECTED
Tetrahydrocannabinol: NOT DETECTED

## 2021-11-16 LAB — CBG MONITORING, ED: Glucose-Capillary: 197 mg/dL — ABNORMAL HIGH (ref 70–99)

## 2021-11-16 LAB — CBC
HCT: 46.9 % — ABNORMAL HIGH (ref 36.0–46.0)
HCT: 41.8 % (ref 36.0–46.0)
Hemoglobin: 13.3 g/dL (ref 12.0–15.0)
Hemoglobin: 12.4 g/dL (ref 12.0–15.0)
MCH: 24.1 pg — ABNORMAL LOW (ref 26.0–34.0)
MCH: 24.7 pg — ABNORMAL LOW (ref 26.0–34.0)
MCHC: 28.4 g/dL — ABNORMAL LOW (ref 30.0–36.0)
MCHC: 29.7 g/dL — ABNORMAL LOW (ref 30.0–36.0)
MCV: 85 fL (ref 80.0–100.0)
MCV: 83.1 fL (ref 80.0–100.0)
Platelets: ADEQUATE 10*3/uL (ref 150–400)
Platelets: 187 10*3/uL (ref 150–400)
RBC: 5.52 MIL/uL — ABNORMAL HIGH (ref 3.87–5.11)
RBC: 5.03 MIL/uL (ref 3.87–5.11)
RDW: 18.9 % — ABNORMAL HIGH (ref 11.5–15.5)
RDW: 18.6 % — ABNORMAL HIGH (ref 11.5–15.5)
WBC: 17.2 10*3/uL — ABNORMAL HIGH (ref 4.0–10.5)
WBC: 12.8 10*3/uL — ABNORMAL HIGH (ref 4.0–10.5)
nRBC: 0.1 % (ref 0.0–0.2)
nRBC: 1 % — ABNORMAL HIGH (ref 0.0–0.2)

## 2021-11-16 LAB — MRSA NEXT GEN BY PCR, NASAL: MRSA by PCR Next Gen: NOT DETECTED

## 2021-11-16 LAB — PROTIME-INR
INR: 1.5 — ABNORMAL HIGH (ref 0.8–1.2)
Prothrombin Time: 17.7 seconds — ABNORMAL HIGH (ref 11.4–15.2)

## 2021-11-16 LAB — TROPONIN I (HIGH SENSITIVITY)
Troponin I (High Sensitivity): 80 ng/L — ABNORMAL HIGH (ref <18)
Troponin I (High Sensitivity): 90 ng/L — ABNORMAL HIGH (ref <18)

## 2021-11-16 LAB — APTT: aPTT: 29 seconds (ref 24–36)

## 2021-11-16 LAB — RESP PANEL BY RT-PCR (FLU A&B, COVID) ARPGX2
Influenza A by PCR: NEGATIVE
Influenza B by PCR: NEGATIVE
SARS Coronavirus 2 by RT PCR: NEGATIVE

## 2021-11-16 LAB — COOXEMETRY PANEL
Carboxyhemoglobin: 2.2 % — ABNORMAL HIGH (ref 0.5–1.5)
Methemoglobin: <0.7 % (ref 0.0–1.5)
O2 Saturation: 86.5 %
Total hemoglobin: 12.3 g/dL (ref 12.0–16.0)

## 2021-11-16 LAB — TSH: TSH: 2.011 u[IU]/mL (ref 0.350–4.500)

## 2021-11-16 LAB — LACTIC ACID, PLASMA
Lactic Acid, Venous: 3.1 mmol/L (ref 0.5–1.9)
Lactic Acid, Venous: 2.7 mmol/L (ref 0.5–1.9)
Lactic Acid, Venous: 1.6 mmol/L (ref 0.5–1.9)

## 2021-11-16 LAB — CK: Total CK: 365 U/L — ABNORMAL HIGH (ref 38–234)

## 2021-11-16 LAB — ACETAMINOPHEN LEVEL: Acetaminophen (Tylenol), Serum: <10 ug/mL — ABNORMAL LOW (ref 10–30)

## 2021-11-16 LAB — BRAIN NATRIURETIC PEPTIDE: B Natriuretic Peptide: 1502 pg/mL — ABNORMAL HIGH (ref 0.0–100.0)

## 2021-11-16 MED ORDER — SODIUM CHLORIDE 0.9% FLUSH
10.0000 mL | Freq: Two times a day (BID) | INTRAVENOUS | Status: DC
  Administered 2021-11-16 – 2021-11-26 (×22): 10 mL

## 2021-11-16 MED ORDER — ROCURONIUM BROMIDE 10 MG/ML (PF) SYRINGE
PREFILLED_SYRINGE | INTRAVENOUS | Status: AC
  Administered 2021-11-16: 100 mg via INTRAVENOUS
  Filled 2021-11-16: qty 10

## 2021-11-16 MED ORDER — FENTANYL BOLUS VIA INFUSION
25.0000 ug | INTRAVENOUS | Status: DC | PRN
  Administered 2021-11-16: 50 ug via INTRAVENOUS
  Administered 2021-11-16 (×2): 25 ug via INTRAVENOUS
  Administered 2021-11-17 (×5): 50 ug via INTRAVENOUS
  Filled 2021-11-16: qty 100

## 2021-11-16 MED ORDER — ORAL CARE MOUTH RINSE
15.0000 mL | OROMUCOSAL | Status: DC
  Administered 2021-11-16 – 2021-11-17 (×13): 15 mL via OROMUCOSAL

## 2021-11-16 MED ORDER — DEXTROSE 50 % IV SOLN
1.0000 | Freq: Once | INTRAVENOUS | Status: AC
  Administered 2021-11-16: 50 mL via INTRAVENOUS
  Filled 2021-11-16: qty 50

## 2021-11-16 MED ORDER — LACTATED RINGERS IV BOLUS (SEPSIS)
1000.0000 mL | Freq: Once | INTRAVENOUS | Status: AC
  Administered 2021-11-16: 1000 mL via INTRAVENOUS

## 2021-11-16 MED ORDER — ENOXAPARIN SODIUM 30 MG/0.3ML IJ SOSY
30.0000 mg | PREFILLED_SYRINGE | INTRAMUSCULAR | Status: DC
Start: 2021-11-16 — End: 2021-11-20
  Administered 2021-11-16 – 2021-11-19 (×4): 30 mg via SUBCUTANEOUS
  Filled 2021-11-16 (×4): qty 0.3

## 2021-11-16 MED ORDER — MIDAZOLAM HCL 2 MG/2ML IJ SOLN: 1.0000 mg | INTRAMUSCULAR | Status: DC | PRN

## 2021-11-16 MED ORDER — LACTATED RINGERS IV SOLN: INTRAVENOUS | Status: DC

## 2021-11-16 MED ORDER — INSULIN ASPART 100 UNIT/ML IJ SOLN
0.0000 [IU] | INTRAMUSCULAR | Status: DC
  Administered 2021-11-16 – 2021-11-17 (×4): 2 [IU] via SUBCUTANEOUS

## 2021-11-16 MED ORDER — FENTANYL CITRATE PF 50 MCG/ML IJ SOSY: 25.0000 ug | PREFILLED_SYRINGE | INTRAMUSCULAR | Status: DC | PRN

## 2021-11-16 MED ORDER — NOREPINEPHRINE 4 MG/250ML-% IV SOLN
0.0000 ug/min | INTRAVENOUS | Status: DC
  Administered 2021-11-16 (×2): 2 ug/min via INTRAVENOUS
  Administered 2021-11-16: 12 ug/min via INTRAVENOUS
  Filled 2021-11-16 (×3): qty 250

## 2021-11-16 MED ORDER — SODIUM CHLORIDE 0.9 % IV BOLUS
1000.0000 mL | Freq: Once | INTRAVENOUS | Status: AC
  Administered 2021-11-16: 1000 mL via INTRAVENOUS

## 2021-11-16 MED ORDER — CHLORHEXIDINE GLUCONATE 0.12% ORAL RINSE (MEDLINE KIT)
15.0000 mL | Freq: Two times a day (BID) | OROMUCOSAL | Status: DC
  Administered 2021-11-16 – 2021-11-26 (×20): 15 mL via OROMUCOSAL

## 2021-11-16 MED ORDER — INSULIN ASPART 100 UNIT/ML IV SOLN
5.0000 [IU] | Freq: Once | INTRAVENOUS | Status: AC
  Administered 2021-11-16: 5 [IU] via INTRAVENOUS

## 2021-11-16 MED ORDER — DOCUSATE SODIUM 50 MG/5ML PO LIQD
100.0000 mg | Freq: Two times a day (BID) | ORAL | Status: DC
  Administered 2021-11-16 – 2021-11-21 (×8): 100 mg
  Filled 2021-11-16 (×8): qty 10

## 2021-11-16 MED ORDER — CHLORHEXIDINE GLUCONATE CLOTH 2 % EX PADS
6.0000 | MEDICATED_PAD | Freq: Every day | CUTANEOUS | Status: DC
  Administered 2021-11-16 – 2021-11-21 (×6): 6 via TOPICAL

## 2021-11-16 MED ORDER — FENTANYL 2500MCG IN NS 250ML (10MCG/ML) PREMIX INFUSION
25.0000 ug/h | INTRAVENOUS | Status: DC
  Administered 2021-11-16: 25 ug/h via INTRAVENOUS
  Filled 2021-11-16: qty 250

## 2021-11-16 MED ORDER — KETAMINE HCL 50 MG/5ML IJ SOSY
2.0000 mg/kg | PREFILLED_SYRINGE | Freq: Once | INTRAMUSCULAR | Status: AC
  Administered 2021-11-16: 198 mg via INTRAVENOUS
  Filled 2021-11-16: qty 20

## 2021-11-16 MED ORDER — ETOMIDATE 2 MG/ML IV SOLN: 10.0000 mg | Freq: Once | INTRAVENOUS | Status: AC

## 2021-11-16 MED ORDER — ETOMIDATE 2 MG/ML IV SOLN
INTRAVENOUS | Status: AC
  Administered 2021-11-16: 10 mg via INTRAVENOUS
  Filled 2021-11-16: qty 20

## 2021-11-16 MED ORDER — CALCIUM GLUCONATE-NACL 1-0.675 GM/50ML-% IV SOLN
1.0000 g | Freq: Once | INTRAVENOUS | Status: AC
  Administered 2021-11-16: 1000 mg via INTRAVENOUS
  Filled 2021-11-16: qty 50

## 2021-11-16 MED ORDER — SODIUM CHLORIDE 0.9 % IV SOLN
2.0000 g | Freq: Once | INTRAVENOUS | Status: AC
  Administered 2021-11-16: 2 g via INTRAVENOUS
  Filled 2021-11-16: qty 12.5

## 2021-11-16 MED ORDER — SODIUM CHLORIDE 0.9 % IV SOLN
2.0000 g | INTRAVENOUS | Status: DC
  Administered 2021-11-16 – 2021-11-17 (×2): 2 g via INTRAVENOUS
  Filled 2021-11-16 (×2): qty 12.5

## 2021-11-16 MED ORDER — VANCOMYCIN HCL IN DEXTROSE 1-5 GM/200ML-% IV SOLN
1000.0000 mg | Freq: Once | INTRAVENOUS | Status: AC
  Administered 2021-11-16: 1000 mg via INTRAVENOUS
  Filled 2021-11-16: qty 200

## 2021-11-16 MED ORDER — VANCOMYCIN HCL 1250 MG/250ML IV SOLN
1250.0000 mg | INTRAVENOUS | Status: DC
  Administered 2021-11-16: 1250 mg via INTRAVENOUS
  Filled 2021-11-16: qty 250

## 2021-11-16 MED ORDER — ENOXAPARIN SODIUM 60 MG/0.6ML IJ SOSY
50.0000 mg | PREFILLED_SYRINGE | INTRAMUSCULAR | Status: DC
  Filled 2021-11-16: qty 0.5

## 2021-11-16 MED ORDER — SODIUM CHLORIDE 0.9% FLUSH: 10.0000 mL | INTRAVENOUS | Status: DC | PRN

## 2021-11-16 MED ORDER — FENTANYL CITRATE PF 50 MCG/ML IJ SOSY: 25.0000 ug | PREFILLED_SYRINGE | Freq: Once | INTRAMUSCULAR | Status: AC

## 2021-11-16 MED ORDER — METRONIDAZOLE 500 MG/100ML IV SOLN
500.0000 mg | Freq: Once | INTRAVENOUS | Status: AC
  Administered 2021-11-16: 500 mg via INTRAVENOUS
  Filled 2021-11-16: qty 100

## 2021-11-16 MED ORDER — LACTATED RINGERS IV SOLN: INTRAVENOUS | Status: AC

## 2021-11-16 MED ORDER — DEXMEDETOMIDINE HCL IN NACL 400 MCG/100ML IV SOLN
0.0000 ug/kg/h | INTRAVENOUS | Status: DC
  Administered 2021-11-16: 0.4 ug/kg/h via INTRAVENOUS
  Administered 2021-11-16: 0.3 ug/kg/h via INTRAVENOUS
  Administered 2021-11-17: 0.5 ug/kg/h via INTRAVENOUS
  Administered 2021-11-17: 1.2 ug/kg/h via INTRAVENOUS
  Filled 2021-11-16 (×4): qty 100

## 2021-11-16 MED ORDER — ROCURONIUM BROMIDE 50 MG/5ML IV SOLN: 100.0000 mg | Freq: Once | INTRAVENOUS | Status: AC

## 2021-11-16 MED ORDER — POLYETHYLENE GLYCOL 3350 17 G PO PACK
17.0000 g | PACK | Freq: Every day | ORAL | Status: DC
  Administered 2021-11-16 – 2021-11-22 (×7): 17 g
  Filled 2021-11-16 (×7): qty 1

## 2021-11-16 NOTE — ED Notes (Signed)
Date and time results received: 11/16/21 0015 ? ? ?Test: LACTIC ?Critical Value: 3.1 ? ?Name of Provider Notified: Thayer Jew MD ? ? ?

## 2021-11-16 NOTE — ED Provider Notes (Signed)
[facility] transport requesting ketamine 2 mg/kg prior to transport due to patient's need for sedation in route.  This was ordered at their request ?  ?Noemi Chapel, MD ?11/16/21 [phone] ? ?

## 2021-11-16 NOTE — Progress Notes (Signed)
Pharmacy Antibiotic Note ? ?ANIYAH NOBIS is a 66 y.o. female admitted on 11/15/2021 with AMS/sepsis.  Pharmacy has been consulted for Vancomycin and Cefepime  dosing.  Vancomycin 1 g IV given in ED at  0145 ? ?Plan: ?Vancomycin 1250 mg IV q48h ?Cefepime 2 g IV q24h ?F/U renal function ? ?Height: '5\' 5"'$  (165.1 cm) ?IBW/kg (Calculated) : 57 ? ?Temp (24hrs), Avg:95.3 ?F (35.2 ?C), Min:94.3 ?F (34.6 ?C), Max:97.3 ?F (36.3 ?C) ? ?Recent Labs  ?Lab 11/15/21 ?2325 11/16/21 ?0131  ?WBC 17.2*  --   ?CREATININE 3.40*  --   ?LATICACIDVEN 3.1* 2.7*  ?  ?CrCl cannot be calculated (Unknown ideal weight.).   ? ?Allergies  ?Allergen Reactions  ? Food Other (See Comments)  ?  Nutmeg= difficulty breathing  ? Nutmeg Oil (Myristica Oil) Hives  ? Other Other (See Comments)  ?  Nutmeg= difficulty breathing  ? Amlodipine Swelling  ? ? ?Gena Laski, [place] Curb ?11/16/2021 6:24 AM ? ?

## 2021-11-16 NOTE — ED Notes (Addendum)
EDP and Respiratory at bedside ? ?Etomidate @ 3374 ?ROC @ 4514 ?Intubated @ 6047 ?Color change @  9987 ?22 at lip ?7.5 airway ? ?Pt vitals stable  ?

## 2021-11-16 NOTE — ED Notes (Signed)
Bair hugger noted on pt. Pt non responsive to voice. Temp foley noted with 90cc of urine. CVC triple lumen to left IJ. Pt intubated. Secretions noted and nurse suctioned pt. Left forearm IV checked for patency and non used lumens.  ?

## 2021-11-16 NOTE — Progress Notes (Addendum)
[facility] Physician-Brief Progress Note ?Patient Name: [name] ?DOB: [birth year] ?MRN: [id number] ? ? ?Date of Service ? 11/16/2021  ?HPI/Events of Note ? Asked to renew IV fluids which are expiring. I noted prior K was 5.8 with AKI, does not have labs ordered to trend  ?eICU Interventions ? Continue fluids ?Check BMP now   ? ? ? ?Intervention Category ?Major Interventions: Electrolyte abnormality - evaluation and management ? ?Starlene Consuegra G Deneen Slager ?11/16/2021, 8:45 PM ? ?Addendum at 9:55 pm  ?Noted K and creatinine. Improving. Continue fluids  ?

## 2021-11-16 NOTE — Progress Notes (Signed)
Pt being followed by [facility] for Sepsis protocol. 

## 2021-11-16 NOTE — H&P (Addendum)
? ?NAME:  [name], MRN:  [id number], DOB:  09-08-1955, LOS: 0 ?ADMISSION DATE:  11/15/2021, CONSULTATION DATE:  4/23 ?REFERRING MD:  Dr. Dina Rich, CHIEF COMPLAINT:  AMS  ? ?History of Present Illness:  ?Patient is a 66 year old female PMH of anxiety/depression, CAD, HTN, HLD, CKD, COPD, breast cancer presents to [facility] ED on 4/22 with AMS. ? ?Family states that they last talked to patient on 4/21 overnight and they thought that she sounded funny or had some slurred speech but they thought it was a reception issue. Family tried calling the next day and patient did not answer. On 4/22, family went to check on patient and found patient on sofa covered in her own feces from head to toe and patient was not responding.  EMS called and patient was transferred to [facility] ED.  Upon arrival to [facility] ED 4/22 patient remained altered.  Temp 97.3 ?F.  Initial BP 84/76.  Sats 83% and was placed on O2.  Initial ABG pH 7.2 and CO2 70.  Patient was placed on BiPAP.  LA 3.1, WBC 17.  Patient started on broad-spectrum antibiotics and given IV fluids.  CXR unremarkable.  UA with small leukocytes.  CT head no acute abnormality.  LFTs elevated and creatinine 3.5, K 7.1, CK 365.  Patient remained hypotensive and was started on levo.  Central line placed.  Patient became less responsive despite being on BiPAP.  Patient was intubated.  Postintubation CXR showing increased LLL opacity with ETT 4 cm above carina.  COVID/flu negative.  UDS positive for benzos, ethanol WNL, acetaminophen level WNL.  PCCM consulted for [facility] transfer and ICU admission. ? ?Pertinent  Medical History  ? ?Past Medical History:  ?Diagnosis Date  ? Anxiety   ? Breast cancer ([place]) 06/30/11  ? inv ductal, ER/PR +, her-2 -  ? CAD (coronary artery disease)   ? NSTEMI 4/08: OM3 occluded (PCI unsuccessful), dRCA 95% => BMS, inf HK, EF 50%;   b. MV 11/12:  IL ischemia => c.  [place] 11/12:  dLAD 70%, OM1 70-80%, then occluded (no change from 2008), dOM filled L->L collats, mRCA stent  occluded, dRCA filled L->R collats, EF 55-65% => med Rx (consider PCI of RCA if refractory angina)  ? Carotid stenosis   ? dopplers 5/08: 0-30% bilateral; 10/12: 0-39% B/L ICA => f/u 04/2013  ? Chronic kidney disease   ? COPD (chronic obstructive pulmonary disease) ([place])   ? Depression   ? Depression with anxiety   ? GERD (gastroesophageal reflux disease)   ? OTC acid reducer prn  ? Headache(784.0)   ? sinus; occ. migraines  ? Hx of radiation therapy 08/24/11 to 10/07/11  ? L breast  ? Hyperlipidemia   ? Hypertension   ? under control; has been on med. x 4 yrs.  ? Myocardial infarction [facility])   ? Stable angina (HCC)   ? a. med Rx after cath 05/2011  ? Stress incontinence, female   ? ? ? ?Significant Hospital Events: ?Including procedures, antibiotic start and stop dates in addition to other pertinent events   ?4/22 admitted to [facility] ED acute encephalopathy; shock started on pressors central line placed; intubated ?4/23 Seen post transport on vent in NAD ? ?Interim History / Subjective:  ?Eyes spontaneously open  ?Nods appropriately  ? ?Objective   ?Blood pressure 104/64, pulse 77, temperature (!) 95.9 ?F (35.5 ?C), resp. rate 10, height 5' 5"  (1.651 m), last menstrual period 10/17/2005, SpO2 99 %. ?   ?Vent Mode: PRVC ?  FiO2 (%):  [100 %] 100 % ?Set Rate:  [26 bmp] 26 bmp ?Vt Set:  [460 mL] 460 mL ?PEEP:  [5 cmH20] 5 cmH20 ?Plateau Pressure:  [29 cmH20] 29 cmH20  ? ?Intake/Output Summary (Last 24 hours) at 11/16/2021 0547 ?Last data filed at 11/16/2021 6213 ?Gross per 24 hour  ?Intake 5427.4 ml  ?Output --  ?Net 5427.4 ml  ? ?There were no vitals filed for this visit. ? ?Examination: ?General: Acute on chronically ill appearing middle aged female lying in bed in NAD ?HEENT: [place]/ATT, MM pink/moist, PERRL,  ?Neuro: Alert and oriented x3, non-focal  ?CV: s1s2 regular rate and rhythm, no murmur, rubs, or gallops,  ?PULM:  Faint expiratory wheeze right greater than left, no increased work of breathing  ?GI: soft, bowel sounds  active in all 4 quadrants, non-tender, non-distended ?Extremities: warm/dry, no edema  ?Skin: no rashes or lesions ? ?Resolved Hospital Problem list   ? ? ?Assessment & Plan:  ?Acute encephalopathy ?-UDS positive for benzos (Xanax on home med list); CO2 70 on ABG ?-CT head negative; ethanol WNL, acetaminophen level WNL ?P: ?Limit sedating meds; frequent neurochecks ?Check salicylate level, ammonia ?Follow-up UC ?Consider MRI ?Maintain neuro protective measures ?Nutrition and bowel regiment  ?Seizure precautions  ?Aspirations precautions  ?Minimize sedation  ? ?Shock ?-Likely septic; UA W/small leukocytes; LLL opacity ?P: ?Admit to ICU with continuous telemetry ?Continue levo for MAP goal greater than 65 ?Check cultures: bcx2, urine culture, and trach culture ?Continue broad spectrum antibiotics ?Check procalcitonin ?Trend troponin and lactate ? ?Acute respiratory failure due to above ?LLL opacity ?COPD ?-On Breztri Aerosphere and albuterol at home; seen by Dr. Erin Fulling ?P: ?Continue ventilator support with lung protective strategies  ?Wean PEEP and FiO2 for sats greater than 90%. ?Head of bed elevated 30 degrees. ?Plateau pressures less than 30 cm H20.  ?Follow intermittent chest x-ray and ABG.   ?SAT/SBT as tolerated, mentation preclude extubation  ?Ensure adequate pulmonary hygiene  ?Follow cultures  ?VAP bundle in place  ?PAD protocol ?Scheduled Brovana, Pulmicort, and Yupelri ? ?AKI on CKD ?Hyperkalemia ?Elevated CK ?P: ?Given calcium, insulin/dextrose at [facility] ED ?Repeat BMP ?Trend CK ?Consider more IV fluids ?Trend BMP / urinary output ?Replace electrolytes as indicated ?Avoid nephrotoxic agents, ensure adequate renal perfusion ? ?Shock liver ?P: ?Trend cmp ?Avoid hepatotoxins  ? ?Hyperglycemia ?T2DM ?P: ?Check A1c ?SSI and CBG monitoring ? ?CAD ?HTN/HLD ?P: ?Continue ASA/statin ?Hold antihypertensives while in shock ?Continuous telemetry  ? ?Hx of anxiety/depression ?-Home meds: Xanax, Lexapro,  hydroxyzine ?P: ?Hold home meds, resume when appropriate  ? ?Breast cancer ?-Diagnosed back in 2012 ?P: ?Continue follow-up outpatient ? ?Restless leg syndrome ?P: ?Hold pramipexole ? ?Best Practice (right click and "Reselect all SmartList Selections" daily)  ? ?Diet/type: NPO w/ meds via tube ?DVT prophylaxis: prophylactic heparin  ?GI prophylaxis: PPI ?Lines: Central line ?Foley:  Yes, and it is still needed ?Code Status:  full code ?Last date of multidisciplinary goals of care discussion: Update family on arrival  ? ?Labs   ?CBC: ?Recent Labs  ?Lab 11/15/21 ?2325  ?WBC 17.2*  ?NEUTROABS 15.8*  ?HGB 13.3  ?HCT 46.9*  ?MCV 85.0  ?PLT PLATELET CLUMPS NOTED ON SMEAR, COUNT APPEARS ADEQUATE  ? ? ?Basic Metabolic Panel: ?Recent Labs  ?Lab 11/15/21 ?2325  ?NA 138  ?K 7.1*  ?CL 100  ?CO2 27  ?GLUCOSE 141*  ?BUN 95*  ?CREATININE 3.40*  ?CALCIUM 8.4*  ? ?GFR: ?CrCl cannot be calculated (Unknown ideal weight.). ?Recent Labs  ?Lab  11/15/21 ?2325 11/16/21 ?0131  ?WBC 17.2*  --   ?LATICACIDVEN 3.1* 2.7*  ? ? ?Liver Function Tests: ?Recent Labs  ?Lab 11/15/21 ?2325  ?AST 1,863*  ?ALT 1,879*  ?ALKPHOS 99  ?BILITOT 1.5*  ?PROT 7.1  ?ALBUMIN 3.5  ? ?No results for input(s): LIPASE, AMYLASE in the last 168 hours. ?No results for input(s): AMMONIA in the last 168 hours. ? ?ABG ?   ?Component Value Date/Time  ? PHART 7.22 (L) 11/16/2021 0522  ? PCO2ART 61 (H) 11/16/2021 0522  ? PO2ART 182 (H) 11/16/2021 0522  ? HCO3 25.4 11/16/2021 0522  ? TCO2 27 07/31/2011 1231  ? ACIDBASEDEF 3.9 (H) 11/16/2021 0522  ? O2SAT 99.5 11/16/2021 0522  ?  ? ?Coagulation Profile: ?Recent Labs  ?Lab 11/15/21 ?2325  ?INR 1.5*  ? ? ?Cardiac Enzymes: ?Recent Labs  ?Lab 11/15/21 ?2325  ?CKTOTAL 365*  ? ? ?HbA1C: ?HB A1C (BAYER DCA - WAIVED)  ?Date/Time Value Ref Range Status  ?10/09/2021 12:59 PM 6.2 (H) 4.8 - 5.6 % Final  ?  Comment:  ?           Prediabetes: 5.7 - 6.4 ?         Diabetes: >6.4 ?         Glycemic control for adults with diabetes: <7.0 ?   ?04/17/2021 02:27 PM 6.4 (H) 4.8 - 5.6 % Final  ?  Comment:  ?           Prediabetes: 5.7 - 6.4 ?         Diabetes: >6.4 ?         Glycemic control for adults with diabetes: <7.0 ?              **Please note reference interval

## 2021-11-16 NOTE — ED Provider Notes (Addendum)
?[place] ?Provider Note ? ? ?CSN: [id number] ?Arrival date & time: 11/15/21  2318 ? ?  ? ?History ? ?Chief Complaint  ?Patient presents with  ? Altered Mental Status  ? ? ?[name] is a 66 y.o. female. ? ?HPI ? ?  ? ?This is a 66 year old female with a history of coronary artery disease, breast cancer who presents with altered mental status.  She was brought in by [facility].  Per family, she has not been seen by them in approximately 1 week.  She was noted to have some slurred speech on the phone yesterday.  They checked on her today and forced entry into the house.  She was found on the sofa covered in her own feces.  She was minimally responsive.  Patient does not provide any history ? ?Level 5 caveat. ? ?Patient's son-in-law is at the bedside.  States that he spoke to her yesterday.  She was able to pick up the phone but she "sounded funny."  He thought it had to do with the reception.  However, today they received a phone call from her best friend who stated that she had not heard from her.  This was abnormal.  Son-in-law went directly to the house and was unable to get in.  He called Sheriff's office and they were able to get a entry into the house.  He found her on the couch as described above.  No recent illnesses that they know of.  She lives alone. ? ?Chart reviewed.  Echo from 02/05/2021 with normal cardiac function.  Heart catheterization in 10/07/2020 showed stable coronary anatomy from catheterization 10 years prior.  Recommended medical management. ? ?Family contacts:  Erlene Quan (SIL) [phone], Lilia Pro (daughter)[phone] ? ?Daughter confirms patient is FULL CODE ? ?Home Medications ?Prior to Admission medications   ?Medication Sig Start Date End Date Taking? Authorizing Provider  ?albuterol (PROVENTIL HFA;VENTOLIN HFA) 108 (90 Base) MCG/ACT inhaler Inhale 2 puffs into the lungs every 6 (six) hours as needed for wheezing or shortness of breath. 07/12/17    Janora Norlander, DO  ?Alcohol Swabs (B-D SINGLE USE SWABS REGULAR) PADS check blood sugars twice daily Dx E11.9 03/26/20   [name], FNP  ?ALPRAZolam (XANAX) 1 MG tablet Take 1 tablet (1 mg total) by mouth 3 (three) times daily as needed for anxiety. 10/09/21   [name], FNP  ?aspirin EC 81 MG tablet Take 81 mg by mouth daily. Swallow whole.    [provider]  ?atorvastatin (LIPITOR) 80 MG tablet Take 1 tablet (80 mg total) by mouth at bedtime. 10/09/21   [name], FNP  ?Blood Glucose Calibration (TRUE METRIX LEVEL 1) Low SOLN USE AS DIRECTED 07/22/21   [name], FNP  ?Blood Glucose Monitoring Suppl (TRUE METRIX AIR GLUCOSE METER) w/Device KIT check blood sugars twice daily Dx E11.9 04/02/20   [name], FNP  ?Budeson-Glycopyrrol-Formoterol (BREZTRI AEROSPHERE) 160-9-4.8 MCG/ACT AERO Inhale 2 puffs into the lungs in the morning and at bedtime. 10/09/21   [name], FNP  ?carvedilol (COREG) 6.25 MG tablet Take 1 tablet (6.25 mg total) by mouth 2 (two) times daily. 10/09/21   [name], FNP  ?Cholecalciferol (VITAMIN D3) 5000 units TABS Take 10,000 Units by mouth every evening.    [provider]  ?escitalopram (LEXAPRO) 20 MG tablet Take 1 tablet (20 mg total) by mouth daily. 10/09/21   [name], FNP  ?fish oil-omega-3 fatty acids 1000 MG capsule Take 1,000 mg by mouth at  bedtime.    [provider]  ?furosemide (LASIX) 40 MG tablet Take 1 tablet (40 mg total) by mouth daily. 10/09/21   [name], FNP  ?glucose blood (TRUE METRIX BLOOD GLUCOSE TEST) test strip TEST BLOOD SUGAR TWICE DAILY 07/22/21   [name], FNP  ?hydrOXYzine (VISTARIL) 25 MG capsule Take 1 capsule by mouth three times daily as needed 08/05/20   [name], FNP  ?isosorbide mononitrate (IMDUR) 30 MG 24 hr tablet Take 0.5 tablets (15 mg total) by mouth daily. 10/09/21   [name],  FNP  ?lisinopril (ZESTRIL) 40 MG tablet Take 1 tablet (40 mg total) by mouth daily. 10/09/21   [name], FNP  ?metFORMIN (GLUCOPHAGE) 500 MG tablet Take 1 tablet (500 mg total) by mouth 2 (two) times daily with a meal. 10/09/21   [name], FNP  ?pramipexole (MIRAPEX) 1 MG tablet Take 1 tablet (1 mg total) by mouth in the morning and at bedtime. 10/09/21   [name], FNP  ?spironolactone (ALDACTONE) 25 MG tablet Take 1 tablet (25 mg total) by mouth daily. 10/09/21   [name], FNP  ?TRUEplus Lancets 33G MISC USE TO CHECK BLOOD SUGAR TWICE DAILY 07/22/21   [name], FNP  ?vitamin B-12 (CYANOCOBALAMIN) 1000 MCG tablet Take 1,000 mcg by mouth daily.    [provider]  ?   ? ?Allergies    ?Food, Nutmeg oil (myristica oil), Other, and Amlodipine   ? ?Review of Systems   ?Review of Systems  ?Unable to perform ROS: Mental status change  ? ?Physical Exam ?Updated Vital Signs ?BP 108/76   Pulse (!) 56   Temp (!) 97.2 ?F (36.2 ?C)   Resp (!) 26   Ht 1.651 m (5' 5" )   LMP 10/17/2005   SpO2 100%   BMI 36.28 kg/m?  ?Physical Exam ?Vitals and nursing note reviewed.  ?Constitutional:   ?   Appearance: She is well-developed. She is obese. She is ill-appearing and toxic-appearing.  ?HENT:  ?   Head: Normocephalic and atraumatic.  ?   Mouth/Throat:  ?   Mouth: Mucous membranes are dry.  ?Eyes:  ?   Pupils: Pupils are equal, round, and reactive to light.  ?Cardiovascular:  ?   Rate and Rhythm: Normal rate and regular rhythm.  ?   Heart sounds: Normal heart sounds.  ?Pulmonary:  ?   Effort: Pulmonary effort is normal. No respiratory distress.  ?   Breath sounds: No wheezing.  ?Abdominal:  ?   General: Bowel sounds are normal.  ?   Palpations: Abdomen is soft.  ?   Tenderness: There is no abdominal tenderness.  ?Musculoskeletal:  ?   Cervical back: Neck supple.  ?   Comments: Bilateral feet cool to touch, blue in color, delayed cap refill, dopplerable posterior  tibial pulses bilateral  ?Skin: ?   General: Skin is warm and dry.  ?   Capillary Refill: Capillary refill takes more than 3 seconds.  ?   Comments: Rash noted in the folds of the pannus and groin, no crepitus  ?Neurological:  ?   Mental Status: She is alert.  ?   Comments: Oriented only to herself, appears to move all 4 extremities spontaneously, intermittently follows commands  ?Psychiatric:  ?   Comments: Unable to assess  ? ? ?ED Results / Procedures / Treatments   ?Labs ?(all labs ordered are listed, but only abnormal results are displayed) ?Labs Reviewed  ?PROTIME-INR - Abnormal; Notable for the following components:  ?  Result Value  ? Prothrombin Time 17.7 (*)   ? INR 1.5 (*)   ? All other components within normal limits  ?CBC - Abnormal; Notable for the following components:  ? WBC 17.2 (*)   ? RBC 5.52 (*)   ? HCT 46.9 (*)   ? MCH 24.1 (*)   ? MCHC 28.4 (*)   ? RDW 18.9 (*)   ? All other components within normal limits  ?DIFFERENTIAL - Abnormal; Notable for the following components:  ? Neutro Abs 15.8 (*)   ? Lymphs Abs 0.6 (*)   ? All other components within normal limits  ?COMPREHENSIVE METABOLIC PANEL - Abnormal; Notable for the following components:  ? Potassium 7.1 (*)   ? Glucose, Bld 141 (*)   ? BUN 95 (*)   ? Creatinine, Ser 3.40 (*)   ? Calcium 8.4 (*)   ? AST 1,863 (*)   ? ALT 1,879 (*)   ? Total Bilirubin 1.5 (*)   ? GFR, Estimated 14 (*)   ? All other components within normal limits  ?RAPID URINE DRUG SCREEN, HOSP PERFORMED - Abnormal; Notable for the following components:  ? Benzodiazepines POSITIVE (*)   ? All other components within normal limits  ?URINALYSIS, ROUTINE W REFLEX MICROSCOPIC - Abnormal; Notable for the following components:  ? Color, Urine AMBER (*)   ? APPearance CLOUDY (*)   ? Hgb urine dipstick MODERATE (*)   ? Protein, ur 30 (*)   ? Leukocytes,Ua SMALL (*)   ? Bacteria, UA MANY (*)   ? All other components within normal limits  ?CK - Abnormal; Notable for the following  components:  ? Total CK 365 (*)   ? All other components within normal limits  ?LACTIC ACID, PLASMA - Abnormal; Notable for the following components:  ? Lactic Acid, Venous 3.1 (*)   ? All other components within no

## 2021-11-16 NOTE — Progress Notes (Signed)
Patient was taken to CT scan on vent with RN/NT to scan head and stomach.Patient was stable and vital within limits while in CT. Patient returned to room and vent and vitals are stable and patient intact. ?

## 2021-11-17 ENCOUNTER — Encounter (HOSPITAL_COMMUNITY): Payer: Self-pay | Admitting: Pulmonary Disease

## 2021-11-17 DIAGNOSIS — A419 Sepsis, unspecified organism: Secondary | ICD-10-CM | POA: Diagnosis not present

## 2021-11-17 DIAGNOSIS — R6521 Severe sepsis with septic shock: Secondary | ICD-10-CM | POA: Diagnosis not present

## 2021-11-17 DIAGNOSIS — J9601 Acute respiratory failure with hypoxia: Secondary | ICD-10-CM | POA: Diagnosis not present

## 2021-11-17 DIAGNOSIS — J9602 Acute respiratory failure with hypercapnia: Secondary | ICD-10-CM | POA: Diagnosis not present

## 2021-11-17 LAB — GLUCOSE, CAPILLARY
Glucose-Capillary: 108 mg/dL — ABNORMAL HIGH (ref 70–99)
Glucose-Capillary: 127 mg/dL — ABNORMAL HIGH (ref 70–99)
Glucose-Capillary: 131 mg/dL — ABNORMAL HIGH (ref 70–99)
Glucose-Capillary: 74 mg/dL (ref 70–99)
Glucose-Capillary: 77 mg/dL (ref 70–99)
Glucose-Capillary: 99 mg/dL (ref 70–99)

## 2021-11-17 LAB — CBC
HCT: 38.5 % (ref 36.0–46.0)
Hemoglobin: 11.5 g/dL — ABNORMAL LOW (ref 12.0–15.0)
MCH: 23.8 pg — ABNORMAL LOW (ref 26.0–34.0)
MCHC: 29.9 g/dL — ABNORMAL LOW (ref 30.0–36.0)
MCV: 79.7 fL — ABNORMAL LOW (ref 80.0–100.0)
Platelets: 108 10*3/uL — ABNORMAL LOW (ref 150–400)
RBC: 4.83 MIL/uL (ref 3.87–5.11)
RDW: 18.5 % — ABNORMAL HIGH (ref 11.5–15.5)
WBC: 14.1 10*3/uL — ABNORMAL HIGH (ref 4.0–10.5)
nRBC: 0.1 % (ref 0.0–0.2)

## 2021-11-17 LAB — HEPATIC FUNCTION PANEL
ALT: 896 U/L — ABNORMAL HIGH (ref 0–44)
AST: 296 U/L — ABNORMAL HIGH (ref 15–41)
Albumin: 1.9 g/dL — ABNORMAL LOW (ref 3.5–5.0)
Alkaline Phosphatase: 66 U/L (ref 38–126)
Bilirubin, Direct: 0.3 mg/dL — ABNORMAL HIGH (ref 0.0–0.2)
Indirect Bilirubin: 1.2 mg/dL — ABNORMAL HIGH (ref 0.3–0.9)
Total Bilirubin: 1.5 mg/dL — ABNORMAL HIGH (ref 0.3–1.2)
Total Protein: 4.3 g/dL — ABNORMAL LOW (ref 6.5–8.1)

## 2021-11-17 LAB — BASIC METABOLIC PANEL
Anion gap: 7 (ref 5–15)
BUN: 82 mg/dL — ABNORMAL HIGH (ref 8–23)
CO2: 26 mmol/L (ref 22–32)
Calcium: 8 mg/dL — ABNORMAL LOW (ref 8.9–10.3)
Chloride: 107 mmol/L (ref 98–111)
Creatinine, Ser: 2.17 mg/dL — ABNORMAL HIGH (ref 0.44–1.00)
GFR, Estimated: 25 mL/min — ABNORMAL LOW (ref >60)
Glucose, Bld: 115 mg/dL — ABNORMAL HIGH (ref 70–99)
Potassium: 4.8 mmol/L (ref 3.5–5.1)
Sodium: 140 mmol/L (ref 135–145)

## 2021-11-17 LAB — CK: Total CK: 57 U/L (ref 38–234)

## 2021-11-17 MED ORDER — IPRATROPIUM-ALBUTEROL 0.5-2.5 (3) MG/3ML IN SOLN
3.0000 mL | Freq: Four times a day (QID) | RESPIRATORY_TRACT | Status: DC
  Administered 2021-11-17 – 2021-11-18 (×3): 3 mL via RESPIRATORY_TRACT
  Filled 2021-11-17 (×3): qty 3

## 2021-11-17 MED ORDER — PRAMIPEXOLE DIHYDROCHLORIDE 1 MG PO TABS
1.0000 mg | ORAL_TABLET | Freq: Two times a day (BID) | ORAL | Status: DC
  Administered 2021-11-17 – 2021-11-19 (×3): 1 mg
  Filled 2021-11-17 (×7): qty 1

## 2021-11-17 MED ORDER — FUROSEMIDE 10 MG/ML IJ SOLN
40.0000 mg | Freq: Two times a day (BID) | INTRAMUSCULAR | Status: AC
Start: 2021-11-17 — End: 2021-11-17
  Administered 2021-11-17 (×2): 40 mg via INTRAVENOUS
  Filled 2021-11-17 (×2): qty 4

## 2021-11-17 MED ORDER — ALPRAZOLAM 0.5 MG PO TABS: 0.5000 mg | ORAL_TABLET | Freq: Three times a day (TID) | ORAL | Status: DC

## 2021-11-17 MED ORDER — ALPRAZOLAM 0.5 MG PO TABS
0.5000 mg | ORAL_TABLET | Freq: Three times a day (TID) | ORAL | Status: DC
  Administered 2021-11-17 – 2021-11-19 (×7): 0.5 mg
  Filled 2021-11-17 (×8): qty 1

## 2021-11-17 MED ORDER — NOREPINEPHRINE 4 MG/250ML-% IV SOLN: 0.0000 ug/min | INTRAVENOUS | Status: DC

## 2021-11-17 MED ORDER — ALBUTEROL SULFATE (2.5 MG/3ML) 0.083% IN NEBU: 2.5000 mg | INHALATION_SOLUTION | RESPIRATORY_TRACT | Status: DC | PRN

## 2021-11-17 MED ORDER — ESCITALOPRAM OXALATE 20 MG PO TABS
20.0000 mg | ORAL_TABLET | Freq: Every day | ORAL | Status: DC
  Administered 2021-11-17 – 2021-11-22 (×6): 20 mg
  Filled 2021-11-17 (×3): qty 1
  Filled 2021-11-17: qty 2
  Filled 2021-11-17: qty 1
  Filled 2021-11-17: qty 2

## 2021-11-17 MED ORDER — SODIUM CHLORIDE 0.9 % IV SOLN
250.0000 mL | INTRAVENOUS | Status: DC
  Administered 2021-11-17: 250 mL via INTRAVENOUS

## 2021-11-17 MED ORDER — ORAL CARE MOUTH RINSE
15.0000 mL | Freq: Two times a day (BID) | OROMUCOSAL | Status: DC
  Administered 2021-11-17 – 2021-11-27 (×19): 15 mL via OROMUCOSAL

## 2021-11-17 MED ORDER — ASPIRIN 81 MG PO CHEW
81.0000 mg | CHEWABLE_TABLET | Freq: Every day | ORAL | Status: DC
  Administered 2021-11-17 – 2021-11-22 (×6): 81 mg
  Filled 2021-11-17 (×6): qty 1

## 2021-11-17 NOTE — Progress Notes (Signed)
eLink Physician-Brief Progress Note ?Patient Name: [name] ?DOB: [birth year] ?MRN: [id number] ? ? ?Date of Service ? 11/17/2021  ?HPI/Events of Note ? Patient with soft blood pressures, she is being aggressively diuresed. Last CVP was 11 earlier today.  ?eICU Interventions ? Peripheral Levophed 0 - 10 mcg ordered to be started only for MAP < 60.  ? ? ? ?  ? ?Kerry Kass [name] ?11/17/2021, 9:04 PM ?

## 2021-11-17 NOTE — Progress Notes (Signed)
An USGPIV (ultrasound guided PIV) has been placed for short-term vasopressor infusion. A correctly placed ivWatch must be used when administering Vasopressors. Should this treatment be needed beyond 72 hours, central line access should be obtained.  It will be the responsibility of the bedside nurse to follow best practice to prevent extravasations.   ?

## 2021-11-17 NOTE — Procedures (Signed)
Extubation Procedure Note ? ?Patient Details:   ?Name: PHEBE DETTMER ?DOB: 1955-08-05 ?MRN: [id number] ?  ?Airway Documentation:  ?  ?Vent end date: 11/17/21 Vent end time: 1025  ? ?Evaluation ? O2 sats: stable throughout ?Complications: Complications of no cuff leak prior ?Patient did tolerate procedure well. ?Bilateral Breath Sounds: Clear, Diminished ?  ?Yes ? ? ?Patient was extubated to 3 LPM nasal cannula. Prior to extubation patient did not have a cuff leak. MD was made aware and wanted to extubate. Patient was able to speak and had clear; diminished BBS. No stridor was noted. Patient had a weak cough. Vitals stable. MD and RN are at bedside. RT will continue to monitor.  ? ?Jaice Lague M ?11/17/2021, 10:48 AM ? ?

## 2021-11-17 NOTE — Progress Notes (Addendum)
? ?NAME:  [name], MRN:  [id number], DOB:  06-29-1956, LOS: 1 ?ADMISSION DATE:  11/15/2021, CONSULTATION DATE:  4/23 ?REFERRING MD:  Dr. Dina Rich, CHIEF COMPLAINT:  AMS  ? ?History of Present Illness:  ?Patient is a 66 year old female PMH of anxiety/depression, CAD, HTN, HLD, CKD, COPD, breast cancer presents to [facility] ED on 4/22 with AMS. ? ?Family states that they last talked to patient on 4/21 overnight and they thought that she sounded funny or had some slurred speech but they thought it was a reception issue. Family tried calling the next day and patient did not answer. On 4/22, family went to check on patient and found patient on sofa covered in her own feces from head to toe and patient was not responding.  EMS called and patient was transferred to [facility] ED.  Upon arrival to [facility] ED 4/22 patient remained altered.  Temp 97.3 ?F.  Initial BP 84/76.  Sats 83% and was placed on O2.  Initial ABG pH 7.2 and CO2 70.  Patient was placed on BiPAP.  LA 3.1, WBC 17.  Patient started on broad-spectrum antibiotics and given IV fluids.  CXR unremarkable.  UA with small leukocytes.  CT head no acute abnormality.  LFTs elevated and creatinine 3.5, K 7.1, CK 365.  Patient remained hypotensive and was started on levo.  Central line placed.  Patient became less responsive despite being on BiPAP.  Patient was intubated.  Postintubation CXR showing increased LLL opacity with ETT 4 cm above carina.  COVID/flu negative.  UDS positive for benzos, ethanol WNL, acetaminophen level WNL.  PCCM consulted for [facility] transfer and ICU admission. ? ?Pertinent  Medical History  ? ?Past Medical History:  ?Diagnosis Date  ? Anxiety   ? Breast cancer ([place]) 06/30/11  ? inv ductal, ER/PR +, her-2 -  ? CAD (coronary artery disease)   ? NSTEMI 4/08: OM3 occluded (PCI unsuccessful), dRCA 95% => BMS, inf HK, EF 50%;   b. MV 11/12:  IL ischemia => c.  [place] 11/12:  dLAD 70%, OM1 70-80%, then occluded (no change from 2008), dOM filled L->L collats, mRCA stent  occluded, dRCA filled L->R collats, EF 55-65% => med Rx (consider PCI of RCA if refractory angina)  ? Carotid stenosis   ? dopplers 5/08: 0-30% bilateral; 10/12: 0-39% B/L ICA => f/u 04/2013  ? Chronic kidney disease   ? COPD (chronic obstructive pulmonary disease) ([place])   ? Depression   ? Depression with anxiety   ? GERD (gastroesophageal reflux disease)   ? OTC acid reducer prn  ? Headache(784.0)   ? sinus; occ. migraines  ? Hx of radiation therapy 08/24/11 to 10/07/11  ? L breast  ? Hyperlipidemia   ? Hypertension   ? under control; has been on med. x 4 yrs.  ? Myocardial infarction [facility])   ? Stable angina (HCC)   ? a. med Rx after cath 05/2011  ? Stress incontinence, female   ? ? ? ?Significant Hospital Events: ?Including procedures, antibiotic start and stop dates in addition to other pertinent events   ?4/22 admitted to [facility] ED acute encephalopathy; shock started on pressors central line placed; intubated ?4/23 Seen post transport on vent in NAD ? ?Interim History / Subjective:  ?No issues overnight ?Weaning this morning 8/5 ?Off pressors  ? ?Objective   ?Blood pressure (!) 139/51, pulse 77, temperature 99.3 ?F (37.4 ?C), resp. rate (!) 26, height 5' 5"  (1.651 m), weight 108.6 kg, last menstrual period 10/17/2005, SpO2 96 %. ?  CVP:  [11 mmHg] 11 mmHg  ?Vent Mode: CPAP;PSV ?FiO2 (%):  [40 %-100 %] 40 % ?Set Rate:  [26 bmp] 26 bmp ?Vt Set:  [460 mL] 460 mL ?PEEP:  [5 cmH20] 5 cmH20 ?Pressure Support:  [8 cmH20] 8 cmH20 ?Plateau Pressure:  [19 cmH20-27 cmH20] 20 cmH20  ? ?Intake/Output Summary (Last 24 hours) at 11/17/2021 0849 ?Last data filed at 11/17/2021 0700 ?Gross per 24 hour  ?Intake 4473.72 ml  ?Output 2040 ml  ?Net 2433.72 ml  ? ?Filed Weights  ? 11/16/21 1035  ?Weight: 108.6 kg  ? ? ?Examination: ?General:  Ao Chronically ill older female sitting upright in bed in NAD ?HEENT: MM pink/moist, ETT/ OGT, pupils 3/reactive ?Neuro:  Alert, eyes open, f/c, MAE ?CV:  rr, NSR, no murmur ?PULM:  non labored on PSV 8/5,  still lightly sleepy with TV in upper 200's, improved with stimulation and fent gtt off/ precedex down, no wheeze,  ?GI: obese, soft, bs+, NT, foley ?Extremities: warm/dry, generalized +1 edema  ?Skin: no rashes, posterior not examined ? ?afebrile ?UOP 1.8L/ 24hrs ?Net +7.8L ?Wts > none today  ? ?Labs> K 4.8, sCr 2.47> 2.17, WBC 12.8> 14.1, plt 187> 108 ? ?Resolved Hospital Problem list   ? ? ?Assessment & Plan:  ?Acute encephalopathy ?-UDS positive for benzos (Xanax on home med list); CO2 70 on ABG ?-CT head negative; ethanol WNL, acetaminophen level WNL ?P: ?- awake and following commands today ?- cont to minimize sedation.  Off fent gtt> change to prn if able, cont precedex, and add back half dose of home xanax 14m TID ? ?Shock ?-Likely septic; UA W/small leukocytes; LLL opacity ?P: ?- off pressors today ?- continue MAP goal > 65 ?- consider d/c CVL 4/25 if remains hemodynamically stable ?- follow cultures>> MRSA neg, trach asp> GPC's> , and UC> GNRs 40k>  ?- continue cefepime, can stop vanc ? ?Acute respiratory failure due to above ?LLL opacity ?COPD ?-On Breztri Aerosphere and albuterol at home; seen by Dr. DErin Fulling?P: ?- continue full MV support with daily SBT/ WUA.  Weaning well thus far, may be able to extubate today although anxiety may limit weaning.  If not extubated today, reduce resting vent rate to 20  ?- CXR in am  ?- send urine strep ?- follow trach asp>  ?- abx as above ?- VAP/ PPI ?- PAD protocol as above, continue precedex, prn fentanyl, adding back home xanax w/ bowel regimen ?- secheduled Brovana, Pulmicort, and Yupelri ? ?AKI on CKD ?Hyperkalemia ?Elevated CK ?P: ?- sCr continues to improve, stable UOP.  Net +7L will diurese today lasix 430mx 2 ?- CK 365> 57 ?- stop MIVF ?- strict I/Os ?- Trend BMP / urinary output ?- Replace electrolytes as indicated ?- Avoid nephrotoxic agents, ensure adequate renal perfusion ? ?Shock liver ?P: ?- LFTs improving today ?- neg tylenol level ?- avoid  hepatotoxins  ? ?Hyperglycemia ?T2DM ?P: ?-  A1c 7.3 ?- cont SSI moderate ? ?CAD ?HTN/HLD ?- asa, lipitor, coreg, lasix, imdur, lisinopril, spiro ?P: ?- Continue ASA/statin ?- continue to hold home antihypertensives.  Off pressors but diuresing today  ?- cont telemetry  ?- holding statin with elevated LFTs ? ?Hx of anxiety/depression ?-Home meds: Xanax, Lexapro, prn hydroxyzine ?P: ?- resumed reduced/ half home xanax, cont lexapro ?- consider prn hydroxyzine if needed  ? ?Breast cancer ?-Diagnosed back in 2012 ?P: ?- Continue follow-up outpatient ? ?Restless leg syndrome ?P: ?- resume home pramipexole ? ?Thrombocytopenia ?P: ?- plts 187> 108,  likely related to transaminitis +/- resolving septic shock.  Timing not c/w with HIT.  No evidence of bleeding  ?- continue to monitor/ trend.  If worsening consider further workup ? ?Best Practice (right click and "Reselect all SmartList Selections" daily)  ? ?Diet/type: NPO w/ meds via tube.  Start TF 4/24 if not extubated ?DVT prophylaxis: prophylactic heparin  ?GI prophylaxis: PPI ?Lines: Central line ?Foley:  Yes, and it is still needed ?Code Status:  full code ?Last date of multidisciplinary goals of care discussion: 4/23 ? ?4/24 no family at bedside on rounds.  Daughter, Lilia Pro, called and updated by phone.  She plans to visit later this afternoon.  ? ?Labs   ?CBC: ?Recent Labs  ?Lab 11/15/21 ?2325 11/16/21 ?1136 11/17/21 ?0442  ?WBC 17.2* 12.8* 14.1*  ?NEUTROABS 15.8*  --   --   ?HGB 13.3 12.4 11.5*  ?HCT 46.9* 41.8 38.5  ?MCV 85.0 83.1 79.7*  ?PLT PLATELET CLUMPS NOTED ON SMEAR, COUNT APPEARS ADEQUATE 187 108*  ? ? ?Basic Metabolic Panel: ?Recent Labs  ?Lab 11/15/21 ?2325 11/16/21 ?1010 11/16/21 ?2023 11/17/21 ?0333  ?NA 138 140 138 140  ?K 7.1* 5.8* 5.1 4.8  ?CL 100 104 104 107  ?CO2 27 27 26 26   ?GLUCOSE 141* 144* 114* 115*  ?BUN 95* 107* 95* 82*  ?CREATININE 3.40* 3.02* 2.47* 2.17*  ?CALCIUM 8.4* 8.0* 7.9* 8.0*  ? ?GFR: ?Estimated Creatinine Clearance: 31.7 mL/min  (A) (by C-G formula based on SCr of 2.17 mg/dL (H)). ?Recent Labs  ?Lab 11/15/21 ?2325 11/16/21 ?0131 11/16/21 ?1010 11/16/21 ?1136 11/17/21 ?0442  ?WBC 17.2*  --   --  12.8* 14.1*  ?LATICACIDVEN 3.1* 2.7* 1.6  -

## 2021-11-18 ENCOUNTER — Inpatient Hospital Stay (HOSPITAL_COMMUNITY): Payer: Medicare HMO

## 2021-11-18 DIAGNOSIS — J9602 Acute respiratory failure with hypercapnia: Secondary | ICD-10-CM | POA: Diagnosis not present

## 2021-11-18 DIAGNOSIS — J9601 Acute respiratory failure with hypoxia: Secondary | ICD-10-CM | POA: Diagnosis not present

## 2021-11-18 DIAGNOSIS — A419 Sepsis, unspecified organism: Secondary | ICD-10-CM | POA: Diagnosis not present

## 2021-11-18 DIAGNOSIS — R6521 Severe sepsis with septic shock: Secondary | ICD-10-CM | POA: Diagnosis not present

## 2021-11-18 LAB — CBC
HCT: 38.3 % (ref 36.0–46.0)
Hemoglobin: 11.2 g/dL — ABNORMAL LOW (ref 12.0–15.0)
MCH: 24.3 pg — ABNORMAL LOW (ref 26.0–34.0)
MCHC: 29.2 g/dL — ABNORMAL LOW (ref 30.0–36.0)
MCV: 83.1 fL (ref 80.0–100.0)
Platelets: 111 10*3/uL — ABNORMAL LOW (ref 150–400)
RBC: 4.61 MIL/uL (ref 3.87–5.11)
RDW: 18.6 % — ABNORMAL HIGH (ref 11.5–15.5)
WBC: 16.2 10*3/uL — ABNORMAL HIGH (ref 4.0–10.5)
nRBC: 0 % (ref 0.0–0.2)

## 2021-11-18 LAB — RENAL FUNCTION PANEL
Albumin: 2 g/dL — ABNORMAL LOW (ref 3.5–5.0)
Anion gap: 4 — ABNORMAL LOW (ref 5–15)
BUN: 61 mg/dL — ABNORMAL HIGH (ref 8–23)
CO2: 31 mmol/L (ref 22–32)
Calcium: 8.1 mg/dL — ABNORMAL LOW (ref 8.9–10.3)
Chloride: 110 mmol/L (ref 98–111)
Creatinine, Ser: 1.62 mg/dL — ABNORMAL HIGH (ref 0.44–1.00)
GFR, Estimated: 35 mL/min — ABNORMAL LOW (ref >60)
Glucose, Bld: 98 mg/dL (ref 70–99)
Phosphorus: 4.4 mg/dL (ref 2.5–4.6)
Potassium: 4.2 mmol/L (ref 3.5–5.1)
Sodium: 145 mmol/L (ref 135–145)

## 2021-11-18 LAB — GLUCOSE, CAPILLARY
Glucose-Capillary: 82 mg/dL (ref 70–99)
Glucose-Capillary: 65 mg/dL — ABNORMAL LOW (ref 70–99)
Glucose-Capillary: 64 mg/dL — ABNORMAL LOW (ref 70–99)
Glucose-Capillary: 87 mg/dL (ref 70–99)
Glucose-Capillary: 165 mg/dL — ABNORMAL HIGH (ref 70–99)
Glucose-Capillary: 88 mg/dL (ref 70–99)
Glucose-Capillary: 110 mg/dL — ABNORMAL HIGH (ref 70–99)

## 2021-11-18 LAB — CULTURE, RESPIRATORY W GRAM STAIN: Culture: NORMAL

## 2021-11-18 LAB — TSH: TSH: 2.044 u[IU]/mL (ref 0.350–4.500)

## 2021-11-18 LAB — URINE CULTURE: Culture: 40000 — AB

## 2021-11-18 LAB — STREP PNEUMONIAE URINARY ANTIGEN: Strep Pneumo Urinary Antigen: NEGATIVE

## 2021-11-18 LAB — MAGNESIUM: Magnesium: 1.8 mg/dL (ref 1.7–2.4)

## 2021-11-18 MED ORDER — MIDODRINE HCL 5 MG PO TABS
10.0000 mg | ORAL_TABLET | Freq: Three times a day (TID) | ORAL | Status: DC
  Administered 2021-11-18 – 2021-11-21 (×9): 10 mg via ORAL
  Filled 2021-11-18 (×9): qty 2

## 2021-11-18 MED ORDER — ARFORMOTEROL TARTRATE 15 MCG/2ML IN NEBU
15.0000 ug | INHALATION_SOLUTION | Freq: Two times a day (BID) | RESPIRATORY_TRACT | Status: DC
  Administered 2021-11-18 – 2021-11-27 (×18): 15 ug via RESPIRATORY_TRACT
  Filled 2021-11-18 (×19): qty 2

## 2021-11-18 MED ORDER — SODIUM CHLORIDE 0.9 % IV SOLN
2.0000 g | INTRAVENOUS | Status: AC
  Administered 2021-11-18 – 2021-11-20 (×3): 2 g via INTRAVENOUS
  Filled 2021-11-18 (×3): qty 20

## 2021-11-18 MED ORDER — BUDESONIDE 0.5 MG/2ML IN SUSP
0.5000 mg | Freq: Two times a day (BID) | RESPIRATORY_TRACT | Status: DC
  Administered 2021-11-18 – 2021-11-27 (×18): 0.5 mg via RESPIRATORY_TRACT
  Filled 2021-11-18 (×19): qty 2

## 2021-11-18 MED ORDER — MAGNESIUM SULFATE 2 GM/50ML IV SOLN
2.0000 g | Freq: Once | INTRAVENOUS | Status: AC
  Administered 2021-11-18: 2 g via INTRAVENOUS
  Filled 2021-11-18: qty 50

## 2021-11-18 MED ORDER — IPRATROPIUM-ALBUTEROL 0.5-2.5 (3) MG/3ML IN SOLN
3.0000 mL | RESPIRATORY_TRACT | Status: DC
  Administered 2021-11-18 – 2021-11-19 (×7): 3 mL via RESPIRATORY_TRACT
  Filled 2021-11-18 (×7): qty 3

## 2021-11-18 MED ORDER — GUAIFENESIN ER 600 MG PO TB12
600.0000 mg | ORAL_TABLET | Freq: Two times a day (BID) | ORAL | Status: DC
  Administered 2021-11-18 – 2021-11-27 (×16): 600 mg via ORAL
  Filled 2021-11-18 (×17): qty 1

## 2021-11-18 NOTE — Progress Notes (Signed)
Patient was transferred from [facility] today to [facility]. She is alert and oriented and in no distress,able to make needs known. Made familiar with surroundings, call bell is within patients reach. ?

## 2021-11-18 NOTE — Progress Notes (Signed)
? ?NAME:  [name], MRN:  [id number], DOB:  12-19-1955, LOS: 2 ?ADMISSION DATE:  11/15/2021, CONSULTATION DATE:  4/23 ?REFERRING MD:  Dr. Dina Rich, CHIEF COMPLAINT:  AMS  ? ?History of Present Illness:  ?Patient is a 66 year old female PMH of anxiety/depression, CAD, HTN, HLD, CKD, COPD, breast cancer presents to [facility] ED on 4/22 with AMS. ? ?Family states that they last talked to patient on 4/21 overnight and they thought that she sounded funny or had some slurred speech but they thought it was a reception issue. Family tried calling the next day and patient did not answer. On 4/22, family went to check on patient and found patient on sofa covered in her own feces from head to toe and patient was not responding.  EMS called and patient was transferred to [facility] ED.  Upon arrival to [facility] ED 4/22 patient remained altered.  Temp 97.3 ?F.  Initial BP 84/76.  Sats 83% and was placed on O2.  Initial ABG pH 7.2 and CO2 70.  Patient was placed on BiPAP.  LA 3.1, WBC 17.  Patient started on broad-spectrum antibiotics and given IV fluids.  CXR unremarkable.  UA with small leukocytes.  CT head no acute abnormality.  LFTs elevated and creatinine 3.5, K 7.1, CK 365.  Patient remained hypotensive and was started on levo.  Central line placed.  Patient became less responsive despite being on BiPAP.  Patient was intubated.  Postintubation CXR showing increased LLL opacity with ETT 4 cm above carina.  COVID/flu negative.  UDS positive for benzos, ethanol WNL, acetaminophen level WNL.  PCCM consulted for [facility] transfer and ICU admission. ? ?Pertinent  Medical History  ? ?Past Medical History:  ?Diagnosis Date  ? Anxiety   ? Breast cancer ([place]) 06/30/11  ? inv ductal, ER/PR +, her-2 -  ? CAD (coronary artery disease)   ? NSTEMI 4/08: OM3 occluded (PCI unsuccessful), dRCA 95% => BMS, inf HK, EF 50%;   b. MV 11/12:  IL ischemia => c.  [place] 11/12:  dLAD 70%, OM1 70-80%, then occluded (no change from 2008), dOM filled L->L collats, mRCA stent  occluded, dRCA filled L->R collats, EF 55-65% => med Rx (consider PCI of RCA if refractory angina)  ? Carotid stenosis   ? dopplers 5/08: 0-30% bilateral; 10/12: 0-39% B/L ICA => f/u 04/2013  ? Chronic kidney disease   ? COPD (chronic obstructive pulmonary disease) ([place])   ? Depression   ? Depression with anxiety   ? GERD (gastroesophageal reflux disease)   ? OTC acid reducer prn  ? Headache(784.0)   ? sinus; occ. migraines  ? Hx of radiation therapy 08/24/11 to 10/07/11  ? L breast  ? Hyperlipidemia   ? Hypertension   ? under control; has been on med. x 4 yrs.  ? Myocardial infarction [facility])   ? Stable angina (HCC)   ? a. med Rx after cath 05/2011  ? Stress incontinence, female   ? ? ?Significant Hospital Events: ?Including procedures, antibiotic start and stop dates in addition to other pertinent events   ?4/22 admitted to [facility] ED acute encephalopathy; shock started on pressors central line placed; intubated ?4/23 Seen post transport on vent in NAD ?4/24 extubated, off pressors ? ?Interim History / Subjective:  ?- Increasing O2 needs overnight- better on mask, ?if simply is mouth breathing or silently aspirating.  Was coughing with bedside assessment, NPO other than sips with meds, pending SLP today.   ?- sBP soft over night, 80-90's but  did not require restart of pressors ?- diuresed almost 6L yesterday, remains net +3.2L ?- afebrile  ?- pt denies any complaints ? ?Objective   ?Blood pressure (!) 93/52, pulse 81, temperature 97.7 ?F (36.5 ?C), resp. rate 19, height 5' 5"  (1.651 m), weight 108.6 kg, last menstrual period 10/17/2005, SpO2 92 %. ?   ?FiO2 (%):  [40 %-55 %] 55 %  ? ?Intake/Output Summary (Last 24 hours) at 11/18/2021 0829 ?Last data filed at 11/18/2021 0600 ?Gross per 24 hour  ?Intake 1161.19 ml  ?Output 5986 ml  ?Net -4824.81 ml  ? ?Filed Weights  ? 11/16/21 1035 11/18/21 0400  ?Weight: 108.6 kg 108.6 kg  ? ?Examination: ?General:  pleasant chronically ill older female sitting upright in bed in  NAD ?HEENT: MM pink/moist ?Neuro: Awake, oriented x3, MAE, not anxious today  ?CV: rr, NSR, no murmur ?PULM:  non labored, coarse with exp wheeze throughout, congested cough- able to get up but then swallows sputum, on VM> salter HFNC 12L  ?GI: obese, soft, bs+, foley   ?Extremities: warm/dry, no LE edema, minimal UE dependent edema  ?Skin: no rashes  ? ?afebrile ?UOP 5.9L/ 24hrs ?Net +3.2L ? ?Labs> K 4.2, sCr 2.47> 2.17> 1.62, WBC 12.8> 14.1> 16.2, plt 187> 108> 111, mag 1.8, TSH 2.044 ?CXR> improving left infiltrate, residual effusions with atelectasis  ? ?Resolved Hospital Problem list   ? ? ?Assessment & Plan:  ?Acute encephalopathy ?-UDS positive for benzos (Xanax on home med list); CO2 70 on ABG ?-CT head negative; ethanol WNL, acetaminophen level WNL ?P: ?- minimize sedation  ?- continue reduced home dose xanax TID ? ?Shock, resolved ?-Likely septic; UA W/small leukocytes; LLL opacity ?P: ?- off pressors 4/24 ?- SBP lower overnight but not requiring pressors, likely due to aggressive diuresis 4/24 ?- continue MAP goal > 65 ?- consider d/c CVL 4/25 if remains hemodynamically stable ?- follow cultures>> MRSA neg, trach asp> pending, and UC> VRE, e.coli, enterococcus gallinarum ?- deescalate cefepime to ceftriaxone, end date 4/28 for 7 days of abx ? ?Acute respiratory failure due to above ?LLL opacity ?COPD ?Tobacco abuse  ?-On Breztri Aerosphere and albuterol at home; seen by Dr. Erin Fulling ?P: ?- extubated 4/24.  Increasing O2 requirements> ? Aspiration vs mouth breather vs atelectasis  ?- ? BIPAP at night to help ?- SLP > no signs of aspiration ?- pending urine strep ?- abx as above ?- continue duonebs, adding brovanna/ pulmicort  ?- follow trach asp> ngtd ?- aggressive pulm hygiene-> IS, flutter, mucinex, MOBILIZE, PT/ OT ?- would benefit from outpt sleep study ?- smoking cessation > patient states "I quit yesterday", will likely need resources to maintain on discharge ? ?AKI on CKD ?Hyperkalemia, resolved   ?Elevated CK, resolved ?P: ?- s/p diureses 4/24 with 6L UOP ?- sCr better today 2.17> 1.62 ?- hold further diureses given soft BPs ?- strict I/Os ?- Trend BMP / urinary output ?- Replace electrolytes as indicated ?- Avoid nephrotoxic agents, ensure adequate renal perfusion ? ?Shock liver, resolving  ?- improving LFTs 4/24 ?P: ?- avoid hepatotoxins  ? ?Hyperglycemia ?T2DM ?P: ?-  A1c 7.3 ?- low CBGs this am, but ? Perfusion as she was asymptomatic.  Continue CBGs q4, holding SSI for now while NPO, but starting diet soon and may need to add back SSI ? ?CAD ?HTN/HLD ?- asa, lipitor, coreg, lasix, imdur, lisinopril, spiro ?P: ?- Continue ASA/statin ?- continue to hold home antihypertensives.  SBP remains soft ?- cont telemetry  ? ? ?Hx of anxiety/depression ?-  Home meds: Xanax, Lexapro, prn hydroxyzine ?P: ?- continue reduced/ half home xanax, cont lexapro ?- consider prn hydroxyzine if needed  ? ?Breast cancer ?-Diagnosed back in 2012 ?P: ?- Continue follow-up outpatient ? ?Restless leg syndrome ?P: ?- cont home pramipexole ? ?Thrombocytopenia ?P: ?- continues to improve.  Monitor  ?- plts 187> 108> 111 ? ?Overall: patient doing well, if she continues to be hemodynamically stable and decreasing O2 needs, will likely transfer to PCU after lunch and to [facility] as of 4/26.   ? ?Best Practice (right click and "Reselect all SmartList Selections" daily)  ? ?Diet/type: Regular consistency (see orders)> advance per SLP recs   ?DVT prophylaxis: prophylactic heparin  ?GI prophylaxis: PPI ?Lines: Central line ?Foley:  Yes, and it is still needed ?Code Status:  full code ?Last date of multidisciplinary goals of care discussion: 4/23 ?Patient updated on plan of care 4/26.  ? ?Labs   ?CBC: ?Recent Labs  ?Lab 11/15/21 ?2325 11/16/21 ?1136 11/17/21 ?0442 11/18/21 ?0045  ?WBC 17.2* 12.8* 14.1* 16.2*  ?NEUTROABS 15.8*  --   --   --   ?HGB 13.3 12.4 11.5* 11.2*  ?HCT 46.9* 41.8 38.5 38.3  ?MCV 85.0 83.1 79.7* 83.1  ?PLT PLATELET CLUMPS NOTED  ON SMEAR, COUNT APPEARS ADEQUATE 187 108* 111*  ? ? ?Basic Metabolic Panel: ?Recent Labs  ?Lab 11/15/21 ?2325 11/16/21 ?1010 11/16/21 ?2023 11/17/21 ?2446 11/18/21 ?0045  ?NA 138 140 138 140 145  ?K 7.1* 5.8* 5.1 4.

## 2021-11-18 NOTE — Evaluation (Signed)
Occupational Therapy Evaluation ?Patient Details ?Name: [name] ?MRN: [id number] ?DOB: 04-01-1956 ?Today's Date: 11/18/2021 ? ? ?History of Present Illness 66 yo female found AMS unresponsive 4/23 in feces with septic shock (+) UA intubated 4/23-4/24 PMH anxiety, breast CA, CAD, nstemi 2008, COPD depression, HLD, HTN MI, stable angina, stress incontinence, lumpectomy 2012, R elbow surg 2008  ? ?Clinical Impression ?  ?PT admitted with AMS with intubation and septic shock. Pt currently with functional limitiations due to the deficits listed below (see OT problem list). Pt currently very lethargic and needs cues to sustain arousal. Pt with no family present to verify home setup. Pt prior to admission was indep living alone. Pt on 11L HFNC and BP map 57 with eob sitting.  Pt will benefit from skilled OT to increase their independence and safety with adls and balance to allow discharge CIR. ?  ?   ? ?Recommendations for follow up therapy are one component of a multi-disciplinary discharge planning process, led by the attending physician.  Recommendations may be updated based on patient status, additional functional criteria and insurance authorization.  ? ?Follow Up Recommendations ? Acute inpatient rehab (3hours/day)  ?  ?Assistance Recommended at Discharge PRN  ?Patient can return home with the following   ? ?  ?Functional Status Assessment ? Patient has had a recent decline in their functional status and demonstrates the ability to make significant improvements in function in a reasonable and predictable amount of time.  ?Equipment Recommendations ? None recommended by OT  ?  ?Recommendations for Other Services Rehab consult ? ? ?  ?Precautions / Restrictions Precautions ?Precautions: Fall ?Precaution Comments: fall risk watch BP, HFNC 11L  ? ?  ? ?Mobility Bed Mobility ?Overal bed mobility: Needs Assistance ?Bed Mobility: Rolling, Supine to Sit, Sit to Supine ?Rolling: Min assist ?  ?Supine to sit: Mod  assist ?Sit to supine: +2 for physical assistance, Mod assist ?  ?General bed mobility comments: pt requires step by cues to progress to EOB and (A) to elevate trunk off surface. pt was able to scoot along eob toward [facility]. pt requires (A) with trunk and bil LE onto bed surface to return to supine. pt with mod (A) to roll onto back. pt reports needed to void bowels and placed on bed pan with pt bridging to remove the pan that was empty. pt falling asleep on pan without void ?  ? ?Transfers ?  ?  ?  ?  ?  ?  ?  ?  ?  ?General transfer comment: defer to next session due to BP ?  ? ?  ?Balance   ?  ?  ?  ?  ?  ?  ?  ?  ?  ?  ?  ?  ?  ?  ?  ?  ?  ?  ?   ? ?ADL either performed or assessed with clinical judgement  ? ?ADL Overall ADL's : Needs assistance/impaired ?Eating/Feeding: Moderate assistance ?  ?Grooming: Dance movement psychotherapist;Wash/dry hands;Moderate assistance ?  ?Upper Body Bathing: Moderate assistance ?  ?Lower Body Bathing: Total assistance ?  ?Upper Body Dressing : Moderate assistance ?  ?Lower Body Dressing: Total assistance ?  ?  ?  ?  ?  ?  ?  ?  ?General ADL Comments: BP limited to eob only this session. pt was able to bridge and scoot at eob during session  ? ? ? ?Vision Baseline Vision/History: 1 Wears glasses ?   ?   ?Perception   ?  ?  Praxis   ?  ? ?Pertinent Vitals/Pain Pain Assessment ?Pain Assessment: Faces ?Faces Pain Scale: Hurts little more ?Pain Location: butt with movement ?Pain Descriptors / Indicators: Grimacing ?Pain Intervention(s): Monitored during session, Repositioned  ? ? ? ?Hand Dominance Right ?  ?Extremity/Trunk Assessment Upper Extremity Assessment ?Upper Extremity Assessment: Generalized weakness;RUE deficits/detail ?RUE Deficits / Details: edema at the hand noted, pt has what appears to be stool in her nails ?  ?Lower Extremity Assessment ?Lower Extremity Assessment: Defer to PT evaluation ?  ?Cervical / Trunk Assessment ?Cervical / Trunk Assessment: Kyphotic ?  ?Communication  Communication ?Communication: No difficulties ?  ?Cognition Arousal/Alertness: Awake/alert ?Behavior During Therapy: Flat affect ?Overall Cognitive Status: Impaired/Different from baseline ?Area of Impairment: Orientation, Memory, Awareness ?  ?  ?  ?  ?  ?  ?  ?  ?Orientation Level: Disoriented to, Time, Situation ?  ?Memory: Decreased short-term memory ?  ?  ?Awareness: Intellectual ?  ?General Comments: pt reports month is July, pt is aware she is in [place]. pt able to recall month is April after 4 minutes. ?  ?  ?General Comments  BP sitting eob 81/47 (57) return supine with 82/50(61) with SCD, HFNC 11 L ? ?  ?Exercises   ?  ?Shoulder Instructions    ? ? ?Home Living Family/patient expects to be discharged to:: Private residence ?Living Arrangements: Alone ?  ?Type of Home: House ?Home Access: Stairs to enter ?  ?  ?Home Layout: One level ?  ?  ?Bathroom Shower/Tub: Tub/shower unit ?  ?Bathroom Toilet: Standard ?  ?  ?  ?  ?Additional Comments: dog Yoshi- pt very fatigued and no family present to vertify home setup information ?  ? ?  ?Prior Functioning/Environment Prior Level of Function : Independent/Modified Independent ?  ?  ?  ?  ?  ?  ?  ?  ?  ? ?  ?  ?OT Problem List: Decreased strength;Decreased activity tolerance;Impaired balance (sitting and/or standing);Decreased safety awareness;Decreased knowledge of use of DME or AE;Decreased knowledge of precautions;Cardiopulmonary status limiting activity;Obesity;Increased edema;Decreased cognition ?  ?   ?OT Treatment/Interventions: Self-care/ADL training;Therapeutic exercise;Neuromuscular education;Energy conservation;DME and/or AE instruction;Manual therapy;Therapeutic activities;Cognitive remediation/compensation;Patient/family education;Balance training  ?  ?OT Goals(Current goals can be found in the care plan section) Acute Rehab OT Goals ?Patient Stated Goal: to go to the bathroom ?OT Goal Formulation: Patient unable to participate in goal  setting ?Time For Goal Achievement: 12/02/21 ?Potential to Achieve Goals: Good  ?OT Frequency: Min 2X/week ?  ? ?Co-evaluation PT/OT/SLP Co-Evaluation/Treatment: Yes ?Reason for Co-Treatment: Necessary to address cognition/behavior during functional activity;For patient/therapist safety;To address functional/ADL transfers ?  ?OT goals addressed during session: ADL's and self-care ?  ? ?  ?AM-PAC OT "6 Clicks" Daily Activity     ?Outcome Measure Help from another person eating meals?: A Lot ?Help from another person taking care of personal grooming?: A Lot ?Help from another person toileting, which includes using toliet, bedpan, or urinal?: A Lot ?Help from another person bathing (including washing, rinsing, drying)?: A Lot ?Help from another person to put on and taking off regular upper body clothing?: A Lot ?Help from another person to put on and taking off regular lower body clothing?: A Lot ?6 Click Score: 12 ?  ?End of Session Equipment Utilized During Treatment: Oxygen ?Nurse Communication: Mobility status;Precautions ? ?Activity Tolerance: Patient tolerated treatment well ?Patient left: in bed;with call bell/phone within reach;with bed alarm set;with SCD's reapplied ? ?OT Visit Diagnosis: Unsteadiness on feet (R26.81);Muscle  weakness (generalized) (M62.81)  ?              ?Time: 7782-4235 ?OT Time Calculation (min): 27 min ?Charges:  OT General Charges ?$OT Visit: 1 Visit ?OT Evaluation ?$OT Eval Moderate Complexity: 1 Mod ? ? ?Brynn, OTR/L  ?Acute Rehabilitation Services ?Office: [phone] ?. ? ? ?Jeri Modena ?11/18/2021, 11:47 AM ?

## 2021-11-18 NOTE — Progress Notes (Signed)
[facility] ADULT ICU REPLACEMENT PROTOCOL ? ? ?The patient does apply for the [facility] Adult ICU Electrolyte Replacment Protocol based on the criteria listed below:  ? ?1.Exclusion criteria: TCTS patients, ECMO patients, and Dialysis patients ?2. Is GFR >/= 30 ml/min? Yes.    ?Patient's GFR today is 30 ?3. Is SCr </= 2? Yes.   ?Patient's SCr is 1.62 mg/dL ?4. Did SCr increase >/= 0.5 in 24 hours? No. ?5.Pt's weight >40kg  Yes.   ?6. Abnormal electrolyte(s): Mag 1.8  ?7. Electrolytes replaced per protocol ?8.  Call MD STAT for K+ </= 2.5, Phos </= 1, or Mag </= 1 ?Physician:  Lucile Shutters ? ?Margaret Pyle 11/18/2021 1:32 AM  ?

## 2021-11-18 NOTE — Evaluation (Signed)
Clinical/Bedside Swallow Evaluation ?Patient Details  ?Name: [name] ?MRN: [id number] ?Date of Birth: [birth year] ? ?Today's Date: 11/18/2021 ?Time: SLP Start Time (ACUTE ONLY): 0017 SLP Stop Time (ACUTE ONLY): 0906 ?SLP Time Calculation (min) (ACUTE ONLY): 17 min ? ?Past Medical History:  ?Past Medical History:  ?Diagnosis Date  ? Anxiety   ? Breast cancer ([place]) 06/30/11  ? inv ductal, ER/PR +, her-2 -  ? CAD (coronary artery disease)   ? NSTEMI 4/08: OM3 occluded (PCI unsuccessful), dRCA 95% => BMS, inf HK, EF 50%;   b. MV 11/12:  IL ischemia => c.  [place] 11/12:  dLAD 70%, OM1 70-80%, then occluded (no change from 2008), dOM filled L->L collats, mRCA stent occluded, dRCA filled L->R collats, EF 55-65% => med Rx (consider PCI of RCA if refractory angina)  ? Carotid stenosis   ? dopplers 5/08: 0-30% bilateral; 10/12: 0-39% B/L ICA => f/u 04/2013  ? Chronic kidney disease   ? COPD (chronic obstructive pulmonary disease) ([place])   ? Depression   ? Depression with anxiety   ? GERD (gastroesophageal reflux disease)   ? OTC acid reducer prn  ? Headache(784.0)   ? sinus; occ. migraines  ? Hx of radiation therapy 08/24/11 to 10/07/11  ? L breast  ? Hyperlipidemia   ? Hypertension   ? under control; has been on med. x 4 yrs.  ? Myocardial infarction [facility])   ? Stable angina (HCC)   ? a. med Rx after cath 05/2011  ? Stress incontinence, female   ? ?Past Surgical History:  ?Past Surgical History:  ?Procedure Laterality Date  ? AXILLARY LYMPH NODE DISSECTION  07/31/2011  ? Procedure: AXILLARY LYMPH NODE DISSECTION;  Surgeon: [name], MD;  Location: [place];  Service: General;  Laterality: Left;  left axillary sentinal node biopsy  ? BREAST LUMPECTOMY Left 06/30/2011  ? BREAST SURGERY    ? CARDIAC CATHETERIZATION  11/02/2006; 06/03/2011  ? [place]  ? CORONARY STENT PLACEMENT  11/02/2006  ? ELBOW SURGERY    ? right  ? LEFT HEART CATH AND CORONARY ANGIOGRAPHY N/A 10/07/2020  ? Procedure: LEFT HEART  CATH AND CORONARY ANGIOGRAPHY;  Surgeon: [name], MD;  Location: [place] CV LAB;  Service: Cardiovascular;  Laterality: N/A;  ? ?HPI:  ?Patient is a 66 year old female PMH of anxiety/depression, CAD, HTN, HLD, CKD, COPD, breast cancer presents to [facility] ED on 4/22 with AMS. Pt fouind on sofa not responsive and covered in fecces. Found to have septic shock, UA with small leukocytes, LLL opacity and acute respiratory failure requiring intubation 4/23-4/24.  ?  ?Assessment / Plan / Recommendation  ?Clinical Impression ? Pt seen for swallow assessment on 15L HFNC. She denies prior history of dysphagia and states she coughs in the morning sometimes because she is a smoker and baseline cough noted priot to po's. Mild lingual candidia observed. She denies odonophagia, vocal quality is clear and volitional cough strong. Upper and lower dentures cleaned and donned which aided her in masticationg solids efficiently. Coordination of swallow and respirations effective with normal post swallow exhalation noted with single and consecutive sips. She needed several rest breaks after consecutive sips. She had one delayed cough with thin throughout assessment. She appears safe with thin and regular texture although ST will plan to follow up once more given history of COPD which can increase opportunity for silent aspiration. NP stated her CXR was improved from yesterday. Pills with thin (1-2 at a time),  straws allowed, upright position and rest breaks when needed. ?SLP Visit Diagnosis: Dysphagia, unspecified (R13.10) ?   ?Aspiration Risk ? Mild aspiration risk  ?  ?Diet Recommendation Regular;Thin liquid  ? ?Liquid Administration via: Straw;Cup ?Medication Administration: Whole meds with liquid ?Supervision: Patient able to self feed;Intermittent supervision to cue for compensatory strategies ?Compensations: Slow rate;Small sips/bites ?Postural Changes: Seated upright at 90 degrees  ?  ?Other  Recommendations Oral Care  Recommendations: Oral care BID   ? ?Recommendations for follow up therapy are one component of a multi-disciplinary discharge planning process, led by the attending physician.  Recommendations may be updated based on patient status, additional functional criteria and insurance authorization. ? ?Follow up Recommendations No SLP follow up  ? ? ?  ?Assistance Recommended at Discharge None  ?Functional Status Assessment Patient has had a recent decline in their functional status and demonstrates the ability to make significant improvements in function in a reasonable and predictable amount of time.  ?Frequency and Duration min 1 x/week  ?1 week ?  ?   ? ?Prognosis Prognosis for Safe Diet Advancement: Good  ? ?  ? ?Swallow Study   ?General HPI: Patient is a 66 year old female PMH of anxiety/depression, CAD, HTN, HLD, CKD, COPD, breast cancer presents to [facility] ED on 4/22 with AMS. Pt fouind on sofa not responsive and covered in fecces. Found to have septic shock, UA with small leukocytes, LLL opacity and acute respiratory failure requiring intubation 4/23-4/24. ?Type of Study: Bedside Swallow Evaluation ?Previous Swallow Assessment:  (none) ?Diet Prior to this Study: NPO ?Temperature Spikes Noted: No ?Respiratory Status:  (HFNC 15 L) ?History of Recent Intubation: Yes ?Length of Intubations (days): 1 days ?Date extubated: 11/17/21 ?Behavior/Cognition: Alert;Cooperative;Pleasant mood ?Oral Cavity Assessment: Other (comment) (lingual candidas) ?Oral Care Completed by SLP: Other (Comment) (cleaned dentures) ?Oral Cavity - Dentition: Dentures, top;Dentures, bottom ?Vision: Functional for self-feeding ?Self-Feeding Abilities: Able to feed self ?Patient Positioning: Upright in bed ?Baseline Vocal Quality: Normal ?Volitional Cough: Strong ?Volitional Swallow: Able to elicit  ?  ?Oral/Motor/Sensory Function Overall Oral Motor/Sensory Function: Within functional limits   ?Ice Chips Ice chips: Not tested   ?Thin Liquid Thin Liquid:  Impaired ?Presentation: Cup;Straw ?Pharyngeal  Phase Impairments: Cough - Delayed (x 1)  ?  ?Nectar Thick Nectar Thick Liquid: Not tested   ?Honey Thick Honey Thick Liquid: Not tested   ?Puree Puree: Within functional limits   ?Solid ? ? ?  Solid: Within functional limits  ? ?  ? ?Houston Siren ?11/18/2021,9:23 AM ? ? ? ?

## 2021-11-18 NOTE — Progress Notes (Signed)
   Inpatient Rehab Admissions Coordinator :  Per therapy recommendations patient was screened for CIR candidacy by Dyer Klug RN MSN. Patient is not yet at a level to tolerate the intensity required to pursue a CIR admit . Patient may have the potential to progress to become a candidate. The CIR admissions team will follow and monitor for progress and place a Rehab Consult order if felt to be appropriate. Please contact me with any questions.  Sabrena Gavitt RN MSN Admissions Coordinator [phone]  

## 2021-11-18 NOTE — Evaluation (Addendum)
Physical Therapy Evaluation ?Patient Details ?Name: [name] ?MRN: [id number] ?DOB: 12-06-1955 ?Today's Date: 11/18/2021 ? ?History of Present Illness ? 66 yo female found AMS unresponsive 4/23 in feces with septic shock (+) UA intubated 4/23-4/24 PMH anxiety, breast CA, CAD, nstemi 2008, COPD depression, HLD, HTN MI, stable angina, stress incontinence, lumpectomy 2012, R elbow surg 2008  ?Clinical Impression ? PTA, pt lives alone and is independent. Pt presents with decreased functional mobility secondary to impaired cognition, balance deficits, and generalized weakness. Pt with hypotension, thus limiting mobility. Pt requiring two person min-mod assist for bed mobility. Deferred out of bed today due to BP. Will continue to progress as tolerated.  ? ?Vitals: ?Supine: 92/57 (69) ?Sitting EOB: 81/54 (63) ?Sitting EOB x 2 minutes: 81/47 (57) ?Return to supine: 78/61 (68) ? ? ?   ? ?Recommendations for follow up therapy are one component of a multi-disciplinary discharge planning process, led by the attending physician.  Recommendations may be updated based on patient status, additional functional criteria and insurance authorization. ? ?Follow Up Recommendations Acute inpatient rehab (3hours/day) ? ?  ?Assistance Recommended at Discharge Frequent or constant Supervision/Assistance  ?Patient can return home with the following ? A lot of help with walking and/or transfers;A lot of help with bathing/dressing/bathroom ? ?  ?Equipment Recommendations Other (comment) (TBA)  ?Recommendations for Other Services ? Rehab consult  ?  ?Functional Status Assessment Patient has had a recent decline in their functional status and demonstrates the ability to make significant improvements in function in a reasonable and predictable amount of time.  ? ?  ?Precautions / Restrictions Precautions ?Precautions: Fall ?Precaution Comments: fall risk watch BP, HFNC 11L ?Restrictions ?Weight Bearing Restrictions: No  ? ?  ? ?Mobility ? Bed  Mobility ?Overal bed mobility: Needs Assistance ?Bed Mobility: Rolling, Supine to Sit, Sit to Supine ?Rolling: Min assist ?  ?Supine to sit: Mod assist ?Sit to supine: +2 for physical assistance, Mod assist ?  ?General bed mobility comments: pt requires step by cues to progress to EOB and (A) to elevate trunk off surface. pt was able to scoot along eob toward [facility]. pt requires (A) with trunk and bil LE onto bed surface to return to supine. pt with mod (A) to roll onto back. pt reports needed to void bowels and placed on bed pan with pt bridging to remove the pan that was empty. pt falling asleep on pan without void ?  ? ?Transfers ?  ?  ?  ?  ?  ?  ?  ?  ?  ?General transfer comment: defer to next session due to BP ?  ? ?Ambulation/Gait ?  ?  ?  ?  ?  ?  ?  ?  ? ?Stairs ?  ?  ?  ?  ?  ? ?Wheelchair Mobility ?  ? ?Modified Rankin (Stroke Patients Only) ?  ? ?  ? ?Balance Overall balance assessment: Needs assistance ?Sitting-balance support: Feet supported ?Sitting balance-Leahy Scale: Fair ?  ?  ?  ?  ?  ?  ?  ?  ?  ?  ?  ?  ?  ?  ?  ?  ?   ? ? ? ?Pertinent Vitals/Pain Pain Assessment ?Pain Assessment: Faces ?Faces Pain Scale: Hurts little more ?Pain Location: butt with movement, back of legs ?Pain Descriptors / Indicators: Grimacing ?Pain Intervention(s): Monitored during session  ? ? ?Home Living Family/patient expects to be discharged to:: Private residence ?Living Arrangements: Alone ?  ?Type of  Home: House ?Home Access: Stairs to enter ?  ?  ?  ?Home Layout: One level ?  ?Additional Comments: dog Yoshi- pt very fatigued and no family present to vertify home setup information  ?  ?Prior Function Prior Level of Function : Independent/Modified Independent ?  ?  ?  ?  ?  ?  ?  ?  ?  ? ? ?Hand Dominance  ? Dominant Hand: Right ? ?  ?Extremity/Trunk Assessment  ? Upper Extremity Assessment ?Upper Extremity Assessment: Generalized weakness ?RUE Deficits / Details: edema at the hand noted, pt has what appears to be stool  in her nails ?  ? ?Lower Extremity Assessment ?Lower Extremity Assessment: Generalized weakness ?  ? ?Cervical / Trunk Assessment ?Cervical / Trunk Assessment: Kyphotic  ?Communication  ? Communication: No difficulties  ?Cognition Arousal/Alertness: Awake/alert ?Behavior During Therapy: Flat affect ?Overall Cognitive Status: Impaired/Different from baseline ?Area of Impairment: Orientation, Memory, Awareness ?  ?  ?  ?  ?  ?  ?  ?  ?Orientation Level: Disoriented to, Time, Situation ?  ?Memory: Decreased short-term memory ?  ?  ?Awareness: Intellectual ?  ?General Comments: pt reports month is July, pt is aware she is in [employer]. pt able to recall month is April after 4 minutes. ?  ?  ? ?  ?General Comments   ? ?  ?Exercises General Exercises - Lower Extremity ?Ankle Circles/Pumps: AROM, Both, 10 reps, Seated ?Long Arc Quad: Both, 10 reps, Seated, AAROM ?Heel Slides: AROM, Both, 5 reps, Seated  ? ?Assessment/Plan  ?  ?PT Assessment Patient needs continued PT services  ?PT Problem List Decreased strength;Decreased activity tolerance;Decreased balance;Decreased mobility;Decreased cognition;Decreased safety awareness;Pain ? ?   ?  ?PT Treatment Interventions DME instruction;Gait training;Functional mobility training;Therapeutic activities;Therapeutic exercise;Balance training;Patient/family education   ? ?PT Goals (Current goals can be found in the Care Plan section)  ?Acute Rehab PT Goals ?Patient Stated Goal: get back to dog Yoshi ?PT Goal Formulation: With patient ?Time For Goal Achievement: 12/02/21 ?Potential to Achieve Goals: Good ? ?  ?Frequency Min 3X/week ?  ? ? ?Co-evaluation PT/OT/SLP Co-Evaluation/Treatment: Yes ?Reason for Co-Treatment: Complexity of the patient's impairments (multi-system involvement);Necessary to address cognition/behavior during functional activity;For patient/therapist safety;To address functional/ADL transfers ?  ?  ?  ? ? ?  ?AM-PAC PT "6 Clicks" Mobility  ?Outcome Measure Help  needed turning from your back to your side while in a flat bed without using bedrails?: A Little ?Help needed moving from lying on your back to sitting on the side of a flat bed without using bedrails?: A Lot ?Help needed moving to and from a bed to a chair (including a wheelchair)?: A Lot ?Help needed standing up from a chair using your arms (e.g., wheelchair or bedside chair)?: A Lot ?Help needed to walk in hospital room?: Total ?Help needed climbing 3-5 steps with a railing? : Total ?6 Click Score: 11 ? ?  ?End of Session   ?Activity Tolerance: Treatment limited secondary to medical complications (Comment) ?Patient left: in bed;with call bell/phone within reach ?Nurse Communication: Mobility status ?PT Visit Diagnosis: Other abnormalities of gait and mobility (R26.89);Muscle weakness (generalized) (M62.81);History of falling (Z91.81) ?  ? ?Time: 0623-7628 ?PT Time Calculation (min) (ACUTE ONLY): 27 min ? ? ?Charges:   PT Evaluation ?$PT Eval Moderate Complexity: 1 Mod ?  ?  ?   ? ? ?Wyona Almas, PT, DPT ?Acute Rehabilitation Services ?Pager [phone] ?Office [phone] ? ? ?Carloine Margo Aye ?11/18/2021, 3:30 PM ? ?

## 2021-11-19 DIAGNOSIS — J9621 Acute and chronic respiratory failure with hypoxia: Secondary | ICD-10-CM

## 2021-11-19 DIAGNOSIS — G9340 Encephalopathy, unspecified: Secondary | ICD-10-CM

## 2021-11-19 DIAGNOSIS — A419 Sepsis, unspecified organism: Secondary | ICD-10-CM | POA: Diagnosis not present

## 2021-11-19 DIAGNOSIS — N179 Acute kidney failure, unspecified: Secondary | ICD-10-CM

## 2021-11-19 DIAGNOSIS — J9622 Acute and chronic respiratory failure with hypercapnia: Secondary | ICD-10-CM

## 2021-11-19 LAB — GLUCOSE, CAPILLARY
Glucose-Capillary: 114 mg/dL — ABNORMAL HIGH (ref 70–99)
Glucose-Capillary: 124 mg/dL — ABNORMAL HIGH (ref 70–99)
Glucose-Capillary: 138 mg/dL — ABNORMAL HIGH (ref 70–99)
Glucose-Capillary: 144 mg/dL — ABNORMAL HIGH (ref 70–99)
Glucose-Capillary: 209 mg/dL — ABNORMAL HIGH (ref 70–99)
Glucose-Capillary: 215 mg/dL — ABNORMAL HIGH (ref 70–99)

## 2021-11-19 LAB — CBC
HCT: 39.9 % (ref 36.0–46.0)
Hemoglobin: 10.9 g/dL — ABNORMAL LOW (ref 12.0–15.0)
MCH: 23.5 pg — ABNORMAL LOW (ref 26.0–34.0)
MCHC: 27.3 g/dL — ABNORMAL LOW (ref 30.0–36.0)
MCV: 86 fL (ref 80.0–100.0)
Platelets: 98 10*3/uL — ABNORMAL LOW (ref 150–400)
RBC: 4.64 MIL/uL (ref 3.87–5.11)
RDW: 18.5 % — ABNORMAL HIGH (ref 11.5–15.5)
WBC: 16.7 10*3/uL — ABNORMAL HIGH (ref 4.0–10.5)
nRBC: 0 % (ref 0.0–0.2)

## 2021-11-19 LAB — RENAL FUNCTION PANEL
Albumin: 1.9 g/dL — ABNORMAL LOW (ref 3.5–5.0)
Anion gap: 5 (ref 5–15)
BUN: 35 mg/dL — ABNORMAL HIGH (ref 8–23)
CO2: 31 mmol/L (ref 22–32)
Calcium: 8.1 mg/dL — ABNORMAL LOW (ref 8.9–10.3)
Chloride: 108 mmol/L (ref 98–111)
Creatinine, Ser: 1.11 mg/dL — ABNORMAL HIGH (ref 0.44–1.00)
GFR, Estimated: 55 mL/min — ABNORMAL LOW (ref >60)
Glucose, Bld: 100 mg/dL — ABNORMAL HIGH (ref 70–99)
Phosphorus: 3.1 mg/dL (ref 2.5–4.6)
Potassium: 4.2 mmol/L (ref 3.5–5.1)
Sodium: 144 mmol/L (ref 135–145)

## 2021-11-19 MED ORDER — PRAMIPEXOLE DIHYDROCHLORIDE 1 MG PO TABS
1.0000 mg | ORAL_TABLET | Freq: Two times a day (BID) | ORAL | Status: DC
  Administered 2021-11-20 – 2021-11-27 (×13): 1 mg via ORAL
  Filled 2021-11-19 (×17): qty 1

## 2021-11-19 MED ORDER — ALPRAZOLAM 0.5 MG PO TABS
0.5000 mg | ORAL_TABLET | Freq: Three times a day (TID) | ORAL | Status: DC
  Administered 2021-11-20: 0.5 mg via ORAL
  Filled 2021-11-19: qty 1

## 2021-11-19 MED ORDER — IPRATROPIUM-ALBUTEROL 0.5-2.5 (3) MG/3ML IN SOLN
3.0000 mL | Freq: Three times a day (TID) | RESPIRATORY_TRACT | Status: DC
  Administered 2021-11-19 – 2021-11-21 (×5): 3 mL via RESPIRATORY_TRACT
  Filled 2021-11-19 (×3): qty 3

## 2021-11-19 NOTE — Progress Notes (Signed)
Physical Therapy Treatment ?Patient Details ?Name: [name] ?MRN: [id number] ?DOB: November 11, 1955 ?Today's Date: 11/19/2021 ? ? ?History of Present Illness Pt is 66 yo female found AMS unresponsive 4/23 in feces with septic shock (+) UA intubated 4/23-4/24.  Pt with PNE. PMH anxiety, breast CA, CAD, nstemi 2008, COPD depression, HLD, HTN MI, stable angina, stress incontinence, lumpectomy 2012, R elbow surg 2008 ? ?  ?PT Comments  ? ? Pt making good progress today. She did have some confusion and needs multimodal cues and cues for sequencing.  She was able to stand multiple times and ambulated 5'x3 with mod A of 2 for safety.  Does fatigue and needs seated rest breaks with chairs kept close during transfers/ambulation. She was on 15 L HFNC with VSS when O2 on (had episode on BSC when she pushed [place] up and sats dropped but quickly returned when reapplied).   ?Recommendations for follow up therapy are one component of a multi-disciplinary discharge planning process, led by the attending physician.  Recommendations may be updated based on patient status, additional functional criteria and insurance authorization. ? ?Follow Up Recommendations ? Acute inpatient rehab (3hours/day) ?  ?  ?Assistance Recommended at Discharge Frequent or constant Supervision/Assistance  ?Patient can return home with the following A lot of help with walking and/or transfers;A lot of help with bathing/dressing/bathroom ?  ?Equipment Recommendations ? Rolling walker (2 wheels);Wheelchair cushion (measurements PT);Wheelchair (measurements PT) (needs further assessment)  ?  ?Recommendations for Other Services   ? ? ?  ?Precautions / Restrictions Precautions ?Precautions: Fall ?Precaution Comments: fall risk watch BP, HFNC 15L  ?  ? ?Mobility ? Bed Mobility ?Overal bed mobility: Needs Assistance ?Bed Mobility: Supine to Sit, Sit to Supine ?  ?  ?Supine to sit: Mod assist ?Sit to supine: Mod assist, +2 for physical assistance ?  ?General bed mobility  comments: Cues for sequencing and increased time able to get to EOB with light mod A. ?  ? ?Transfers ?Overall transfer level: Needs assistance ?Equipment used: Rolling walker (2 wheels) ?Transfers: Sit to/from Stand, Bed to chair/wheelchair/BSC ?Sit to Stand: Mod assist, +2 safety/equipment ?  ?Step pivot transfers: Mod assist, +2 safety/equipment ?  ?  ?  ?General transfer comment: Pt performed sit to stand x 5 during session with cues, use of momentum, and light mod A of 2 for safety.  She transferred to [facility] for BM and required assist for cleaning ?  ? ?Ambulation/Gait ?Ambulation/Gait assistance: Mod assist, +2 safety/equipment ?Gait Distance (Feet): 5 Feet (5'x3) ?Assistive device: Rolling walker (2 wheels) ?Gait Pattern/deviations: Step-to pattern, Decreased stride length, Shuffle ?Gait velocity: decreased ?  ?  ?General Gait Details: Pt ambulating 5'x3 during session with seated rest breaks.  Mod A for balance, chairs kept close, +2 for safety ? ? ?Stairs ?  ?  ?  ?  ?  ? ? ?Wheelchair Mobility ?  ? ?Modified Rankin (Stroke Patients Only) ?  ? ? ?  ?Balance Overall balance assessment: Needs assistance ?Sitting-balance support: Feet supported, No upper extremity supported ?Sitting balance-Leahy Scale: Fair ?  ?  ?Standing balance support: Bilateral upper extremity supported, Reliant on assistive device for balance ?Standing balance-Leahy Scale: Poor ?Standing balance comment: Requiring RW and min A static, mod A at times dynamic ?  ?  ?  ?  ?  ?  ?  ?  ?  ?  ?  ?  ? ?  ?Cognition Arousal/Alertness: Awake/alert ?Behavior During Therapy: [facility] for tasks assessed/performed ?Overall Cognitive Status:  Impaired/Different from baseline ?Area of Impairment: Orientation, Memory, Awareness, Problem solving ?  ?  ?  ?  ?  ?  ?  ?  ?Orientation Level: Disoriented to, Time, Situation ?  ?Memory: Decreased short-term memory ?  ?  ?Awareness: Intellectual ?Problem Solving: Slow processing, Requires tactile cues ?General  Comments: Pt thought the keyboard to computer was blackberries ?  ?  ? ?  ?Exercises   ? ?  ?General Comments General comments (skin integrity, edema, etc.): Pt's BP and HR stable.  Pt on 15 L HFNC with sats 96%.  Noted sats dropping to 80% when sitting BSC, pt had pushed [name] up.  [name] returned and sats quickly back to 96%. ?  ?  ? ?Pertinent Vitals/Pain Pain Assessment ?Pain Assessment: No/denies pain  ? ? ?Home Living   ?  ?  ?  ?  ?  ?  ?  ?  ?  ?   ?  ?Prior Function    ?  ?  ?   ? ?PT Goals (current goals can now be found in the care plan section) Progress towards PT goals: Progressing toward goals ? ?  ?Frequency ? ? ? Min 3X/week ? ? ? ?  ?PT Plan Current plan remains appropriate  ? ? ?Co-evaluation   ?  ?  ?  ?  ? ?  ?AM-PAC PT "6 Clicks" Mobility   ?Outcome Measure ? Help needed turning from your back to your side while in a flat bed without using bedrails?: A Little ?Help needed moving from lying on your back to sitting on the side of a flat bed without using bedrails?: A Lot ?Help needed moving to and from a bed to a chair (including a wheelchair)?: A Lot ?Help needed standing up from a chair using your arms (e.g., wheelchair or bedside chair)?: A Lot ?Help needed to walk in hospital room?: Total ?Help needed climbing 3-5 steps with a railing? : Total ?6 Click Score: 11 ? ?  ?End of Session Equipment Utilized During Treatment: Gait belt ?Activity Tolerance: Patient tolerated treatment well ?Patient left: in bed;with call bell/phone within reach;with bed alarm set ?Nurse Communication: Mobility status ?PT Visit Diagnosis: Other abnormalities of gait and mobility (R26.89);Muscle weakness (generalized) (M62.81);History of falling (Z91.81) ?  ? ? ?Time: 7253-6644 ?PT Time Calculation (min) (ACUTE ONLY): 34 min ? ?Charges:  $Gait Training: 8-22 mins ?$Therapeutic Activity: 8-22 mins          ?          ? ?Abran Richard, PT ?Acute Rehab Services ?Pager [phone] ?[name] [phone] ? ? ? ?[name]  [name] ?11/19/2021, 4:56 PM ? ?

## 2021-11-19 NOTE — Progress Notes (Addendum)
PROGRESS NOTE        PATIENT DETAILS Name: [name] Age: 66 y.o. Sex: female Date of Birth: 1956/01/12 Admit Date: 11/15/2021 Admitting Physician Karren Burly, MD ZOX:WRUEAV, [name], FNP  Brief Summary: Patient is a 66 y.o.  female with history of CAD s/p PCI, HTN, HLD COPD, CKD stage IIIa-who presented to the hospital with altered mental status-felt to have acute metabolic encephalopathy-septic shock due to PNA.  Intubated and admitted by PCCM-upon stability-transfer to [facility].   Significant events: 4/22>> presented to [facility] ED with encephalopathy, shock-found to have PNA-intubated and transferred to [facility]. 4/24>> extubated 4/26>> transfer to [facility].  Significant studies: 4/22>> CT head: No acute intracranial abnormality. 4/23>> CXR: Increased opacity in left lower lobe. 4/23>> CT chest/abdomen/pelvis: Extensive left-sided PNA, thyroid nodules. 4/23>> CT head: No acute intracranial abnormality.  Significant microbiology data: 4/22>> influenza/COVID PCR: Negative 4/22>> blood culture: Negative 4/23>> blood culture: Negative 4/23>> urine culture: E. coli, VRE 4/23>> tracheal aspirate culture: Normal respiratory flora.  Procedures: 4/22-4/24>> ETT  Consults: None   Subjective: Lying comfortably in bed-denies any chest pain or shortness of breath.  Objective: Vitals: Blood pressure 122/65, pulse 83, temperature 98.5 F (36.9 C), temperature source Axillary, resp. rate (!) 22, height 5\' 5"  (1.651 m), weight 108.6 kg, last menstrual period 10/17/2005, SpO2 98 %.   Exam: Gen Exam:Alert awake-not in any distress HEENT:atraumatic, normocephalic Chest: B/L clear to auscultation anteriorly CVS:S1S2 regular Abdomen:soft non tender, non distended Extremities:no edema Neurology: Non focal Skin: no rash  Pertinent Labs/Radiology:    Latest Ref Rng & Units 11/19/2021    1:43 AM 11/18/2021   12:45 AM 11/17/2021    4:42 AM  CBC  WBC 4.0  - 10.5 K/uL 16.7   16.2   14.1    Hemoglobin 12.0 - 15.0 g/dL 40.9   81.1   91.4    Hematocrit 36.0 - 46.0 % 39.9   38.3   38.5    Platelets 150 - 400 K/uL 98   111   108      Lab Results  Component Value Date   NA 144 11/19/2021   K 4.2 11/19/2021   CL 108 11/19/2021   CO2 31 11/19/2021      Assessment/Plan: Septic shock: Due to PNA-sepsis physiology has improved-on antimicrobial therapy-culture data as above.  Acute metabolic encephalopathy: Multifactorial in the setting of sepsis-with some contributions from benzos, mild hypercarbia.  Encephalopathy has improved-CT head x2 negative for acute abnormalities.  Acute hypoxic respiratory failure: Due to extensive left-sided PNA-extubated on 4/24-appears comfortable but still remains on 8-10 L of salter high flow.  Titrate down FiO2 as tolerated  AKI on CKD stage IIIa: AKI hemodynamically mediated-improving with supportive care.  Shock liver: Improving-follow LFTs periodically.  CAD s/p PCI to RCA in 2012: No anginal symptoms-continue ASA.  Reviewed last cardiology-last LHC 10/07/2020-showed stable CAD-recommendations were for medical management.  Resume statin/beta-blocker once LFTs and BP permit.  Chronic HFpEF: Volume status stable-once BP stabilizes further-could consider resumption of diuretic regimen to ensure negative balance.  HTN: BP stable-on low-dose midodrine.  Antihypertensives remain on hold  Anxiety/depression: Appears relatively stable-on Xanax at half home dose-on Lexapro.  Restless leg syndrome: On Mirapex.  History of breast cancer: Stable for outpatient follow-up with PCP.  COPD: Not in exacerbation-continue bronchodilators.  Probable OSA: Given hypercarbia-PCCM recommending BiPAP nightly.  Bilateral thyroid  nodule: Seen incidentally on CT chest-stable for outpatient follow-up with PCP.  TSH stable.  Pressure Ulcer: Pressure Injury 11/16/21 Buttocks Left;Medial Stage 2 -  Partial thickness loss of dermis  presenting as a shallow open injury with a red, pink wound bed without slough. Red open area (Active)  11/16/21 1030  Location: Buttocks  Location Orientation: Left;Medial  Staging: Stage 2 -  Partial thickness loss of dermis presenting as a shallow open injury with a red, pink wound bed without slough.  Wound Description (Comments): Red open area  Present on Admission: Yes  Dressing Type Foam - Lift dressing to assess site every shift 11/19/21 0800   Obesity: Estimated body mass index is 39.84 kg/m as calculated from the following:   Height as of this encounter: 5\' 5"  (1.651 m).   Weight as of this encounter: 108.6 kg.   Code status:   Code Status: Prior   DVT Prophylaxis: enoxaparin (LOVENOX) injection 30 mg Start: 11/16/21 2200 SCDs Start: 11/16/21 1312   Family Communication: None at bedside   Disposition Plan: Status is: Inpatient Remains inpatient appropriate because: Resolving hypoxia-still on 8-9 L of oxygen-not yet stable for discharge.   Planned Discharge Destination: CIR   Diet: Diet Order             Diet regular Room service appropriate? Yes; Fluid consistency: Thin  Diet effective now                     Antimicrobial agents: Anti-infectives (From admission, onward)    Start     Dose/Rate Route Frequency Ordered Stop   11/18/21 1000  cefTRIAXone (ROCEPHIN) 2 g in sodium chloride 0.9 % 100 mL IVPB        2 g 200 mL/hr over 30 Minutes Intravenous Every 24 hours 11/18/21 0906 11/21/21 0959   11/16/21 2200  ceFEPIme (MAXIPIME) 2 g in sodium chloride 0.9 % 100 mL IVPB  Status:  Discontinued        2 g 200 mL/hr over 30 Minutes Intravenous Every 24 hours 11/16/21 0627 11/18/21 0906   11/16/21 1000  vancomycin (VANCOREADY) IVPB 1250 mg/250 mL  Status:  Discontinued        1,250 mg 166.7 mL/hr over 90 Minutes Intravenous Every 48 hours 11/16/21 0627 11/17/21 0844   11/16/21 0015  ceFEPIme (MAXIPIME) 2 g in sodium chloride 0.9 % 100 mL IVPB        2  g 200 mL/hr over 30 Minutes Intravenous  Once 11/16/21 0001 11/16/21 0054   11/16/21 0015  metroNIDAZOLE (FLAGYL) IVPB 500 mg        500 mg 100 mL/hr over 60 Minutes Intravenous  Once 11/16/21 0001 11/16/21 0147   11/16/21 0015  vancomycin (VANCOCIN) IVPB 1000 mg/200 mL premix        1,000 mg 200 mL/hr over 60 Minutes Intravenous  Once 11/16/21 0001 11/16/21 0246        MEDICATIONS: Scheduled Meds:  ALPRAZolam  0.5 mg Per Tube TID   arformoterol  15 mcg Nebulization BID   aspirin  81 mg Per Tube Daily   budesonide (PULMICORT) nebulizer solution  0.5 mg Nebulization BID   chlorhexidine gluconate (MEDLINE KIT)  15 mL Mouth Rinse BID   Chlorhexidine Gluconate Cloth  6 each Topical Daily   docusate  100 mg Per Tube BID   enoxaparin (LOVENOX) injection  30 mg Subcutaneous Q24H   escitalopram  20 mg Per Tube Daily   guaiFENesin  600 mg Oral  BID   ipratropium-albuterol  3 mL Nebulization TID   mouth rinse  15 mL Mouth Rinse BID   midodrine  10 mg Oral TID WC   polyethylene glycol  17 g Per Tube Daily   pramipexole  1 mg Per Tube BID   sodium chloride flush  10-40 mL Intracatheter Q12H   Continuous Infusions:  sodium chloride Stopped (11/18/21 0436)   cefTRIAXone (ROCEPHIN)  IV 2 g (11/19/21 0937)   PRN Meds:.albuterol, sodium chloride flush   I have personally reviewed following labs and imaging studies  LABORATORY DATA: CBC: Recent Labs  Lab 11/15/21 2325 11/16/21 1136 11/17/21 0442 11/18/21 0045 11/19/21 0143  WBC 17.2* 12.8* 14.1* 16.2* 16.7*  NEUTROABS 15.8*  --   --   --   --   HGB 13.3 12.4 11.5* 11.2* 10.9*  HCT 46.9* 41.8 38.5 38.3 39.9  MCV 85.0 83.1 79.7* 83.1 86.0  PLT PLATELET CLUMPS NOTED ON SMEAR, COUNT APPEARS ADEQUATE 187 108* 111* 98*    Basic Metabolic Panel: Recent Labs  Lab 11/16/21 1010 11/16/21 2023 11/17/21 0333 11/18/21 0045 11/19/21 0143  NA 140 138 140 145 144  K 5.8* 5.1 4.8 4.2 4.2  CL 104 104 107 110 108  CO2 27 26 26 31 31    GLUCOSE 144* 114* 115* 98 100*  BUN 107* 95* 82* 61* 35*  CREATININE 3.02* 2.47* 2.17* 1.62* 1.11*  CALCIUM 8.0* 7.9* 8.0* 8.1* 8.1*  MG  --   --   --  1.8  --   PHOS  --   --   --  4.4 3.1    GFR: Estimated Creatinine Clearance: 61.9 mL/min (A) (by C-G formula based on SCr of 1.11 mg/dL (H)).  Liver Function Tests: Recent Labs  Lab 11/15/21 2325 11/17/21 0333 11/18/21 0045 11/19/21 0143  AST 1,863* 296*  --   --   ALT 1,879* 896*  --   --   ALKPHOS 99 66  --   --   BILITOT 1.5* 1.5*  --   --   PROT 7.1 4.3*  --   --   ALBUMIN 3.5 1.9* 2.0* 1.9*   No results for input(s): LIPASE, AMYLASE in the last 168 hours. No results for input(s): AMMONIA in the last 168 hours.  Coagulation Profile: Recent Labs  Lab 11/15/21 2325  INR 1.5*    Cardiac Enzymes: Recent Labs  Lab 11/15/21 2325 11/17/21 0333  CKTOTAL 365* 57    BNP (last 3 results) Recent Labs    06/16/21 1045  PROBNP 74    Lipid Profile: No results for input(s): CHOL, HDL, LDLCALC, TRIG, CHOLHDL, LDLDIRECT in the last 72 hours.  Thyroid Function Tests: Recent Labs    11/18/21 0045  TSH 2.044    Anemia Panel: No results for input(s): VITAMINB12, FOLATE, FERRITIN, TIBC, IRON, RETICCTPCT in the last 72 hours.  Urine analysis:    Component Value Date/Time   COLORURINE AMBER (A) 11/15/2021 2325   APPEARANCEUR CLOUDY (A) 11/15/2021 2325   LABSPEC 1.016 11/15/2021 2325   PHURINE 5.0 11/15/2021 2325   GLUCOSEU NEGATIVE 11/15/2021 2325   HGBUR MODERATE (A) 11/15/2021 2325   BILIRUBINUR NEGATIVE 11/15/2021 2325   KETONESUR NEGATIVE 11/15/2021 2325   PROTEINUR 30 (A) 11/15/2021 2325   UROBILINOGEN 0.2 05/14/2011 1145   NITRITE NEGATIVE 11/15/2021 2325   LEUKOCYTESUR SMALL (A) 11/15/2021 2325    Sepsis Labs: Lactic Acid, Venous    Component Value Date/Time   LATICACIDVEN 1.6 11/16/2021 1010    MICROBIOLOGY:  Recent Results (from the past 240 hour(s))  Resp Panel by RT-PCR (Flu A&B, Covid)  Nasopharyngeal Swab     Status: None   Collection Time: 11/15/21 11:25 PM   Specimen: Nasopharyngeal Swab; Nasopharyngeal(NP) swabs in vial transport medium  Result Value Ref Range Status   SARS Coronavirus 2 by RT PCR NEGATIVE NEGATIVE Final    Comment: (NOTE) SARS-CoV-2 target nucleic acids are NOT DETECTED.  The SARS-CoV-2 RNA is generally detectable in upper respiratory specimens during the acute phase of infection. The lowest concentration of SARS-CoV-2 viral copies this assay can detect is 138 copies/mL. A negative result does not preclude SARS-Cov-2 infection and should not be used as the sole basis for treatment or other patient management decisions. A negative result may occur with  improper specimen collection/handling, submission of specimen other than nasopharyngeal swab, presence of viral mutation(s) within the areas targeted by this assay, and inadequate number of viral copies(<138 copies/mL). A negative result must be combined with clinical observations, patient history, and epidemiological information. The expected result is Negative.  Fact Sheet for Patients:  BloggerCourse.com  Fact Sheet for Healthcare Providers:  SeriousBroker.it  This test is no t yet approved or cleared by the [place] FDA and  has been authorized for detection and/or diagnosis of SARS-CoV-2 by FDA under an Emergency Use Authorization (EUA). This EUA will remain  in effect (meaning this test can be used) for the duration of the COVID-19 declaration under Section 564(b)(1) of the Act, 21 U.S.C.section 360bbb-3(b)(1), unless the authorization is terminated  or revoked sooner.       Influenza A by PCR NEGATIVE NEGATIVE Final   Influenza B by PCR NEGATIVE NEGATIVE Final    Comment: (NOTE) The Xpert Xpress SARS-CoV-2/FLU/RSV plus assay is intended as an aid in the diagnosis of influenza from Nasopharyngeal swab specimens and should not be  used as a sole basis for treatment. Nasal washings and aspirates are unacceptable for Xpert Xpress SARS-CoV-2/FLU/RSV testing.  Fact Sheet for Patients: BloggerCourse.com  Fact Sheet for Healthcare Providers: SeriousBroker.it  This test is not yet approved or cleared by the [place] FDA and has been authorized for detection and/or diagnosis of SARS-CoV-2 by FDA under an Emergency Use Authorization (EUA). This EUA will remain in effect (meaning this test can be used) for the duration of the COVID-19 declaration under Section 564(b)(1) of the Act, 21 U.S.C. section 360bbb-3(b)(1), unless the authorization is terminated or revoked.  Performed at [facility], [address], [place], [place] [address]   Blood culture (routine x 2)     Status: None (Preliminary result)   Collection Time: 11/15/21 11:26 PM   Specimen: Right Antecubital; Blood  Result Value Ref Range Status   Specimen Description   Final    RIGHT ANTECUBITAL BOTTLES DRAWN AEROBIC AND ANAEROBIC   Special Requests Blood Culture adequate volume  Final   Culture   Final    NO GROWTH 4 DAYS Performed at [facility], [address] [place], [place] [address]    Report Status PENDING  Incomplete  Urine Culture     Status: Abnormal   Collection Time: 11/16/21 12:00 AM   Specimen: In/Out Cath Urine  Result Value Ref Range Status   Specimen Description   Final    IN/OUT CATH URINE Performed at [facility], [address] [place], [place] [address]    Special Requests   Final    NONE Performed at [facility], [address] [place], [place] [address]  Culture (A)  Final    40,000 COLONIES/mL ESCHERICHIA COLI 20,000 COLONIES/mL ENTEROCOCCUS GALLINARUM VANCOMYCIN RESISTANT ENTEROCOCCUS    Report Status 11/18/2021 FINAL  Final   Organism ID, Bacteria ESCHERICHIA COLI (A)  Final   Organism ID, Bacteria ENTEROCOCCUS GALLINARUM (A)  Final      Susceptibility    Escherichia coli - MIC*    AMPICILLIN <=2 SENSITIVE Sensitive     CEFAZOLIN <=4 SENSITIVE Sensitive     CEFEPIME <=0.12 SENSITIVE Sensitive     CEFTRIAXONE <=0.25 SENSITIVE Sensitive     CIPROFLOXACIN <=0.25 SENSITIVE Sensitive     GENTAMICIN <=1 SENSITIVE Sensitive     IMIPENEM <=0.25 SENSITIVE Sensitive     NITROFURANTOIN <=16 SENSITIVE Sensitive     TRIMETH/SULFA <=20 SENSITIVE Sensitive     AMPICILLIN/SULBACTAM <=2 SENSITIVE Sensitive     PIP/TAZO <=4 SENSITIVE Sensitive     * 40,000 COLONIES/mL ESCHERICHIA COLI   Enterococcus gallinarum - MIC*    AMPICILLIN <=2 SENSITIVE Sensitive     NITROFURANTOIN <=16 SENSITIVE Sensitive     VANCOMYCIN >=32 RESISTANT Resistant     * 20,000 COLONIES/mL ENTEROCOCCUS GALLINARUM  Blood culture (routine x 2)     Status: None (Preliminary result)   Collection Time: 11/16/21  1:31 AM   Specimen: Site Not Specified; Blood  Result Value Ref Range Status   Specimen Description SITE NOT SPECIFIED BOTTLES DRAWN AEROBIC ONLY  Final   Special Requests   Final    Blood Culture results may not be optimal due to an inadequate volume of blood received in culture bottles   Culture   Final    NO GROWTH 3 DAYS Performed at [facility], [address], [place], [place] [address]    Report Status PENDING  Incomplete  MRSA Next Gen by PCR, Nasal     Status: None   Collection Time: 11/16/21 10:10 AM   Specimen: Nasal Mucosa; Nasal Swab  Result Value Ref Range Status   MRSA by PCR Next Gen NOT DETECTED NOT DETECTED Final    Comment: (NOTE) The GeneXpert MRSA Assay (FDA approved for NASAL specimens only), is one component of a comprehensive MRSA colonization surveillance program. It is not intended to diagnose MRSA infection nor to guide or monitor treatment for MRSA infections. Test performance is not FDA approved in patients less than 53 years old. Performed at [facility], [address] [place], [place] [address]   Culture, Respiratory w Gram  Stain     Status: None   Collection Time: 11/16/21 10:27 AM   Specimen: Tracheal Aspirate  Result Value Ref Range Status   Specimen Description TRACHEAL ASPIRATE  Final   Special Requests NONE  Final   Gram Stain   Final    ABUNDANT WBC PRESENT, PREDOMINANTLY PMN MODERATE GRAM VARIABLE ROD MODERATE GRAM POSITIVE COCCI IN PAIRS AND CHAINS    Culture   Final    RARE Normal respiratory flora-no Staph aureus or Pseudomonas seen Performed at [facility], [address], [place], [place] [address]    Report Status 11/18/2021 FINAL  Final    RADIOLOGY STUDIES/RESULTS: DG Chest Port 1 View  Result Date: 11/18/2021 CLINICAL DATA:  Respiratory failure EXAM: PORTABLE CHEST 1 VIEW COMPARISON:  Chest x-ray dated November 16, 2021 FINDINGS: Cardiac and mediastinal contours are unchanged. Interval removal of ET tube, left IJ line, and enteric tube. Heterogeneous opacities of the left lung, decreased when compared to prior exam. No large pleural effusion or evidence of pneumothorax.  IMPRESSION: 1. Interval removal of ET tube, left IJ line, and enteric tube. 2. Heterogeneous opacities of the left lung, decreased when compared to prior exam. Electronically Signed   By: Allegra Lai M.D.   On: 11/18/2021 08:12     LOS: 3 days   Jeoffrey Massed, MD  [facility]    To contact the attending provider between 7A-7P or the covering provider during after hours 7P-7A, please log into the web site [email or web address] and access using universal [place] password for that web site. If you do not have the password, please call the hospital operator.  11/19/2021, 3:17 PM

## 2021-11-19 NOTE — Progress Notes (Signed)
Occupational Therapy Treatment ?Patient Details ?Name: [name] ?MRN: [id number] ?DOB: 08/03/1955 ?Today's Date: 11/19/2021 ? ? ?History of present illness 66 yo female found AMS unresponsive 4/23 in feces with septic shock (+) UA intubated 4/23-4/24 PMH anxiety, breast CA, CAD, nstemi 2008, COPD depression, HLD, HTN MI, stable angina, stress incontinence, lumpectomy 2012, R elbow surg 2008 ?  ?OT comments ? Pt in chair HFNC 11L with high risk for fall from chair due to patient scooting down in the chair. Recommendation for geo mat to help with comfort as pt reasoning for scooting was "my butt hurts" and chair alarm waist belt. Recommendation remains CIR at this time. Pt much more alert , brisk responses and able to answer several home questions. OT updating in flow chart.   ? ?Recommendations for follow up therapy are one component of a multi-disciplinary discharge planning process, led by the attending physician.  Recommendations may be updated based on patient status, additional functional criteria and insurance authorization. ?   ?Follow Up Recommendations ? Acute inpatient rehab (3hours/day)  ?  ?Assistance Recommended at Discharge PRN  ?Patient can return home with the following ?   ?  ?Equipment Recommendations ? None recommended by OT  ?  ?Recommendations for Other Services Rehab consult ? ?  ?Precautions / Restrictions Precautions ?Precautions: Fall ?Precaution Comments: fall risk watch BP, HFNC 11L  ? ? ?  ? ?Mobility Bed Mobility ?Overal bed mobility: Needs Assistance ?Bed Mobility: Rolling, Sit to Supine ?Rolling: Min assist ?  ?  ?Sit to supine: Mod assist ?  ?General bed mobility comments: pt with mod cues to sequence task and mod (A) to lift bil Le onto bed surface ?  ? ?Transfers ?Overall transfer level: Needs assistance ?Equipment used:  (stedy) ?Transfers: Sit to/from Stand ?Sit to Stand: +2 physical assistance, Max assist ?  ?  ?  ?  ?  ?General transfer comment: pt requires (A) to power up  but once static stannding able to sustain with bil Ue support ?  ?  ?Balance Overall balance assessment: Needs assistance ?Sitting-balance support: Bilateral upper extremity supported, Feet supported ?Sitting balance-Leahy Scale: Fair ?  ?  ?Standing balance support: Bilateral upper extremity supported, During functional activity, Reliant on assistive device for balance ?Standing balance-Leahy Scale: Poor ?  ?  ?  ?  ?  ?  ?  ?  ?  ?  ?  ?  ?   ? ?ADL either performed or assessed with clinical judgement  ? ?ADL Overall ADL's : Needs assistance/impaired ?  ?  ?  ?  ?  ?  ?  ?  ?  ?  ?  ?  ?Toilet Transfer: +2 for physical assistance;Maximal assistance;Regular Toilet (stedy) ?Toilet Transfer Details (indicate cue type and reason): simulated from recliner ?  ?Toileting - Clothing Manipulation Details (indicate cue type and reason): noted to have wet sacral pad so removed. pt with wound on L buttock and RN notified ?  ?  ?  ?General ADL Comments: pt in chair on arrival scooting down in chair due to "my butt hurts" pt on the space between the foot rest and chair so high fall risk. no chair alarm present ?  ? ?Extremity/Trunk Assessment Upper Extremity Assessment ?Upper Extremity Assessment: Generalized weakness ?  ?Lower Extremity Assessment ?Lower Extremity Assessment: Generalized weakness ?  ?  ?  ? ?Vision   ?  ?  ?Perception   ?  ?Praxis   ?  ? ?Cognition Arousal/Alertness: Awake/alert ?  Behavior During Therapy: Flat affect ?Overall Cognitive Status: Impaired/Different from baseline ?Area of Impairment: Orientation, Memory, Awareness ?  ?  ?  ?  ?  ?  ?  ?  ?Orientation Level: Disoriented to, Time, Situation ?  ?Memory: Decreased short-term memory ?  ?  ?Awareness: Intellectual ?  ?General Comments: pt more alert and responsive this session compared to last session. pt progressing toward goals. ?  ?  ?   ?Exercises   ? ?  ?Shoulder Instructions   ? ? ?  ?General Comments BP in chair 125/47 (66) and post movement  110/73(86) sitting eob  ? ? ?Pertinent Vitals/ Pain       Pain Assessment ?Pain Assessment: Faces ?Faces Pain Scale: Hurts little more ?Pain Location: L chest pain ?Pain Descriptors / Indicators: Grimacing ?Pain Intervention(s): Monitored during session, Repositioned ? ?Home Living   ?  ?  ?  ?Home Access: Stairs to enter ?Entrance Stairs-Number of Steps: 2 ?Entrance Stairs-Rails: None ?Home Layout: One level ?  ?  ?Bathroom Shower/Tub: Tub/shower unit ?  ?Bathroom Toilet: Handicapped height ?  ?  ?Home Equipment: None ?  ?  ?  ? ?  ?Prior Functioning/Environment    ?  ?  ?  ?   ? ?Frequency ? Min 2X/week  ? ? ? ? ?  ?Progress Toward Goals ? ?OT Goals(current goals can now be found in the care plan section) ? Progress towards OT goals: Progressing toward goals ? ?Acute Rehab OT Goals ?Patient Stated Goal: to talk to family today ?OT Goal Formulation: Patient unable to participate in goal setting ?Time For Goal Achievement: 12/02/21 ?Potential to Achieve Goals: Good ?ADL Goals ?Pt Will Perform Grooming: with min guard assist;sitting ?Pt Will Perform Upper Body Bathing: with min guard assist;sitting ?Pt Will Transfer to Toilet: with mod assist;bedside commode;stand pivot transfer ?Additional ADL Goal #1: pt will demonstrates sustained attention to adl task  ?Plan Discharge plan remains appropriate   ? ?Co-evaluation ? ? ?   ?  ?  ?  ?  ? ?  ?AM-PAC OT "6 Clicks" Daily Activity     ?Outcome Measure ? ? Help from another person eating meals?: A Little ?Help from another person taking care of personal grooming?: A Little ?Help from another person toileting, which includes using toliet, bedpan, or urinal?: A Lot ?Help from another person bathing (including washing, rinsing, drying)?: A Lot ?Help from another person to put on and taking off regular upper body clothing?: A Lot ?Help from another person to put on and taking off regular lower body clothing?: A Lot ?6 Click Score: 14 ? ?  ?End of Session Equipment Utilized  During Treatment: Oxygen ? ?OT Visit Diagnosis: Unsteadiness on feet (R26.81);Muscle weakness (generalized) (M62.81) ?  ?Activity Tolerance Patient tolerated treatment well ?  ?Patient Left in bed;with call bell/phone within reach;with bed alarm set;with SCD's reapplied ?  ?Nurse Communication Mobility status;Precautions ?  ? ?   ? ?Time: [phone] ?OT Time Calculation (min): 21 min ? ?Charges: OT General Charges ?$OT Visit: 1 Visit ?OT Treatments ?$Self Care/Home Management : 8-22 mins ? ? ?Brynn, OTR/L  ?Acute Rehabilitation Services ?Office: [phone] ?. ? ? ?Jeri Modena ?11/19/2021, 9:27 AM ?

## 2021-11-19 NOTE — Progress Notes (Signed)
?  Transition of Care (TOC) Screening Note ? ? ?Patient Details  ?Name: [name] ?Date of Birth: 14-Jan-1956 ? ? ?Transition of Care (TOC) CM/SW Contact:    ?Cyndi Bender, RN ?Phone Number: ?11/19/2021, 8:14 AM ? ? ? ?Transition of Care Department [facility]) has reviewed patient and no TOC needs have been identified at this time. We will continue to monitor patient advancement through interdisciplinary progression rounds. If new patient transition needs arise, please place a TOC consult. ? ? ?

## 2021-11-19 NOTE — Progress Notes (Signed)
SLP Cancellation Note ? ?Patient Details ?Name: [name] ?MRN: [id number] ?DOB: May 26, 1956 ? ? ?Cancelled treatment:        Attempted to see for swallow therapy however pt is currently on Bipap. Will continue efforts.  ? ? ?Houston Siren ?11/19/2021, 11:40 AM ?

## 2021-11-20 DIAGNOSIS — A419 Sepsis, unspecified organism: Secondary | ICD-10-CM | POA: Diagnosis not present

## 2021-11-20 DIAGNOSIS — N179 Acute kidney failure, unspecified: Secondary | ICD-10-CM | POA: Diagnosis not present

## 2021-11-20 DIAGNOSIS — J9621 Acute and chronic respiratory failure with hypoxia: Secondary | ICD-10-CM | POA: Diagnosis not present

## 2021-11-20 DIAGNOSIS — G9340 Encephalopathy, unspecified: Secondary | ICD-10-CM | POA: Diagnosis not present

## 2021-11-20 LAB — CBC
HCT: 39.8 % (ref 36.0–46.0)
Hemoglobin: 10.9 g/dL — ABNORMAL LOW (ref 12.0–15.0)
MCH: 23.7 pg — ABNORMAL LOW (ref 26.0–34.0)
MCHC: 27.4 g/dL — ABNORMAL LOW (ref 30.0–36.0)
MCV: 86.5 fL (ref 80.0–100.0)
Platelets: 107 10*3/uL — ABNORMAL LOW (ref 150–400)
RBC: 4.6 MIL/uL (ref 3.87–5.11)
RDW: 18.8 % — ABNORMAL HIGH (ref 11.5–15.5)
WBC: 18.6 10*3/uL — ABNORMAL HIGH (ref 4.0–10.5)
nRBC: 0 % (ref 0.0–0.2)

## 2021-11-20 LAB — COMPREHENSIVE METABOLIC PANEL
ALT: 317 U/L — ABNORMAL HIGH (ref 0–44)
AST: 39 U/L (ref 15–41)
Albumin: 1.9 g/dL — ABNORMAL LOW (ref 3.5–5.0)
Alkaline Phosphatase: 56 U/L (ref 38–126)
Anion gap: 4 — ABNORMAL LOW (ref 5–15)
BUN: 21 mg/dL (ref 8–23)
CO2: 32 mmol/L (ref 22–32)
Calcium: 8.2 mg/dL — ABNORMAL LOW (ref 8.9–10.3)
Chloride: 109 mmol/L (ref 98–111)
Creatinine, Ser: 1.04 mg/dL — ABNORMAL HIGH (ref 0.44–1.00)
GFR, Estimated: 60 mL/min — ABNORMAL LOW (ref >60)
Glucose, Bld: 113 mg/dL — ABNORMAL HIGH (ref 70–99)
Potassium: 5 mmol/L (ref 3.5–5.1)
Sodium: 145 mmol/L (ref 135–145)
Total Bilirubin: 0.8 mg/dL (ref 0.3–1.2)
Total Protein: 5 g/dL — ABNORMAL LOW (ref 6.5–8.1)

## 2021-11-20 LAB — CULTURE, BLOOD (ROUTINE X 2)
Culture: NO GROWTH
Special Requests: ADEQUATE

## 2021-11-20 LAB — GLUCOSE, CAPILLARY
Glucose-Capillary: 142 mg/dL — ABNORMAL HIGH (ref 70–99)
Glucose-Capillary: 169 mg/dL — ABNORMAL HIGH (ref 70–99)
Glucose-Capillary: 120 mg/dL — ABNORMAL HIGH (ref 70–99)
Glucose-Capillary: 94 mg/dL (ref 70–99)

## 2021-11-20 MED ORDER — ENOXAPARIN SODIUM 40 MG/0.4ML IJ SOSY
40.0000 mg | PREFILLED_SYRINGE | INTRAMUSCULAR | Status: DC
Start: 2021-11-20 — End: 2021-11-27
  Administered 2021-11-20 – 2021-11-26 (×7): 40 mg via SUBCUTANEOUS
  Filled 2021-11-20 (×7): qty 0.4

## 2021-11-20 MED ORDER — ALPRAZOLAM 0.5 MG PO TABS
0.2500 mg | ORAL_TABLET | Freq: Three times a day (TID) | ORAL | Status: DC | PRN
  Administered 2021-11-22 – 2021-11-26 (×2): 0.25 mg via ORAL
  Filled 2021-11-20 (×2): qty 1

## 2021-11-20 MED ORDER — SODIUM CHLORIDE 0.9 % IV SOLN
2.0000 g | INTRAVENOUS | Status: AC
  Administered 2021-11-21: 2 g via INTRAVENOUS
  Filled 2021-11-20: qty 20

## 2021-11-20 NOTE — Progress Notes (Signed)
?      ?                 PROGRESS NOTE ? ?      ?PATIENT DETAILS ?Name: [name] ?Age: 66 y.o. ?Sex: female ?Date of Birth: 1955/10/29 ?Admit Date: 11/15/2021 ?Admitting Physician Lanier Clam, MD ?NID:[id number], [name], FNP ? ?Brief Summary: ?Patient is a 66 y.o.  female with history of CAD s/p PCI, HTN, HLD COPD, CKD stage IIIa-who presented to the hospital with altered mental status-felt to have acute metabolic encephalopathy-septic shock due to PNA.  Intubated and admitted by PCCM-upon stability-transfer to [facility]. ? ? ?Significant events: ?4/22>> presented to [facility] ED with encephalopathy, shock-found to have PNA-intubated and transferred to [facility]. ?4/24>> extubated ?4/26>> transfer to [facility]. ? ?Significant studies: ?4/22>> CT head: No acute intracranial abnormality. ?4/23>> CXR: Increased opacity in left lower lobe. ?4/23>> CT chest/abdomen/pelvis: Extensive left-sided PNA, thyroid nodules. ?4/23>> CT head: No acute intracranial abnormality. ? ?Significant microbiology data: ?4/22>> influenza/COVID PCR: Negative ?4/22>> blood culture: Negative ?4/23>> blood culture: Negative ?4/23>> urine culture: E. coli, VRE ?4/23>> tracheal aspirate culture: Normal respiratory flora. ? ?Procedures: ?4/22-4/24>> ETT ? ?Consults: ?None  ? ?Subjective: ?No major issues overnight-feels better-remains on around 6-7 L of oxygen today. ? ?Objective: ?Vitals: ?Blood pressure (!) 105/59, pulse 95, temperature 98.8 ?F (37.1 ?C), temperature source Oral, resp. rate 18, height 5' 5"  (1.651 m), weight 108.6 kg, last menstrual period 10/17/2005, SpO2 98 %.  ? ?Exam: ?Gen Exam:Alert awake-not in any distress ?HEENT:atraumatic, normocephalic ?Chest: B/L clear to auscultation anteriorly ?CVS:S1S2 regular ?Abdomen:soft non tender, non distended ?Extremities:no edema ?Neurology: Non focal ?Skin: no rash  ? ?Pertinent Labs/Radiology: ? ?  Latest Ref Rng & Units 11/20/2021  ?  1:57 AM 11/19/2021  ?  1:43 AM 11/18/2021  ? 12:45 AM  ?CBC   ?WBC 4.0 - 10.5 K/uL 18.6   16.7   16.2    ?Hemoglobin 12.0 - 15.0 g/dL 10.9   10.9   11.2    ?Hematocrit 36.0 - 46.0 % 39.8   39.9   38.3    ?Platelets 150 - 400 K/uL 107   98   111    ?  ?Lab Results  ?Component Value Date  ? NA 145 11/20/2021  ? K 5.0 11/20/2021  ? CL 109 11/20/2021  ? CO2 32 11/20/2021  ? ?  ? ? ?Assessment/Plan: ?Septic shock: Due to PNA-sepsis physiology has improved-plan is to complete at least 7 days of total antimicrobial therapy.  Culture data as above.  ? ?Acute metabolic encephalopathy: Multifactorial in the setting of sepsis-with some contributions from benzos, mild hypercarbia.  Encephalopathy has improved-CT head x2 negative for acute abnormalities. ? ?Acute hypoxic respiratory failure: Due to extensive left-sided PNA-extubated on 4/24-slowly improving-plan is to slowly titrate down FiO2 as tolerated.  Remains on 6-8 L of HFNC.   ? ?AKI on CKD stage IIIa: AKI hemodynamically mediated-improving with supportive care. ? ?Shock liver: Improving-follow LFTs periodically. ? ?CAD s/p PCI to RCA in 2012: No anginal symptoms-continue ASA.  Reviewed last cardiology-last LHC 10/07/2020-showed stable CAD-recommendations were for medical management.  Resume statin/beta-blocker once LFTs and BP permit. ? ?Chronic HFpEF: Volume status stable-once BP stabilizes further-could consider resumption of diuretic regimen to ensure negative balance. ? ?HTN: BP stable-on low-dose midodrine.  Antihypertensives remain on hold ? ?Anxiety/depression: Appears relatively stable-on Xanax at half home dose-on Lexapro. ? ?Restless leg syndrome: On Mirapex. ? ?History of breast cancer: Stable for outpatient follow-up with PCP. ? ?  COPD: Not in exacerbation-continue bronchodilators. ? ?Probable OSA: Given hypercarbia-PCCM recommending BiPAP nightly. ? ?Bilateral thyroid nodule: Seen incidentally on CT chest-stable for outpatient follow-up with PCP.  TSH stable. ? ?Pressure Ulcer: ?Pressure Injury 11/16/21 Buttocks  Left;Medial Stage 2 -  Partial thickness loss of dermis presenting as a shallow open injury with a red, pink wound bed without slough. Red open area (Active)  ?11/16/21 1030  ?Location: Buttocks  ?Location Orientation: Left;Medial  ?Staging: Stage 2 -  Partial thickness loss of dermis presenting as a shallow open injury with a red, pink wound bed without slough.  ?Wound Description (Comments): Red open area  ?Present on Admission: Yes  ?Dressing Type Foam - Lift dressing to assess site every shift 11/19/21 2014  ? ?Obesity: ?Estimated body mass index is 39.84 kg/m? as calculated from the following: ?  Height as of this encounter: 5' 5"  (1.651 m). ?  Weight as of this encounter: 108.6 kg.  ? ?Code status: ?  Code Status: Prior  ? ?DVT Prophylaxis: ?enoxaparin (LOVENOX) injection 30 mg Start: 11/16/21 2200 ?SCDs Start: 11/16/21 1312 ?  ?Family Communication: None at bedside ? ? ?Disposition Plan: ?Status is: Inpatient ?Remains inpatient appropriate because: Resolving hypoxia-still on 8-9 L of oxygen-not yet stable for discharge. ?  ?Planned Discharge Destination: CIR ? ? ?Diet: ?Diet Order   ? ?       ?  DIET DYS 3 Room service appropriate? Yes; Fluid consistency: Thin  Diet effective now       ?  ? ?  ?  ? ?  ?  ? ? ?Antimicrobial agents: ?Anti-infectives (From admission, onward)  ? ? Start     Dose/Rate Route Frequency Ordered Stop  ? 11/18/21 1000  cefTRIAXone (ROCEPHIN) 2 g in sodium chloride 0.9 % 100 mL IVPB       ? 2 g ?200 mL/hr over 30 Minutes Intravenous Every 24 hours 11/18/21 0906 11/20/21 1056  ? 11/16/21 2200  ceFEPIme (MAXIPIME) 2 g in sodium chloride 0.9 % 100 mL IVPB  Status:  Discontinued       ? 2 g ?200 mL/hr over 30 Minutes Intravenous Every 24 hours 11/16/21 0627 11/18/21 0906  ? 11/16/21 1000  vancomycin (VANCOREADY) IVPB 1250 mg/250 mL  Status:  Discontinued       ? 1,250 mg ?166.7 mL/hr over 90 Minutes Intravenous Every 48 hours 11/16/21 0627 11/17/21 0844  ? 11/16/21 0015  ceFEPIme (MAXIPIME)  2 g in sodium chloride 0.9 % 100 mL IVPB       ? 2 g ?200 mL/hr over 30 Minutes Intravenous  Once 11/16/21 0001 11/16/21 0054  ? 11/16/21 0015  metroNIDAZOLE (FLAGYL) IVPB 500 mg       ? 500 mg ?100 mL/hr over 60 Minutes Intravenous  Once 11/16/21 0001 11/16/21 0147  ? 11/16/21 0015  vancomycin (VANCOCIN) IVPB 1000 mg/200 mL premix       ? 1,000 mg ?200 mL/hr over 60 Minutes Intravenous  Once 11/16/21 0001 11/16/21 0246  ? ?  ? ? ? ?MEDICATIONS: ?Scheduled Meds: ? ALPRAZolam  0.5 mg Oral TID  ? arformoterol  15 mcg Nebulization BID  ? aspirin  81 mg Per Tube Daily  ? budesonide (PULMICORT) nebulizer solution  0.5 mg Nebulization BID  ? chlorhexidine gluconate (MEDLINE KIT)  15 mL Mouth Rinse BID  ? Chlorhexidine Gluconate Cloth  6 each Topical Daily  ? docusate  100 mg Per Tube BID  ? enoxaparin (LOVENOX) injection  30 mg Subcutaneous Q24H  ?  escitalopram  20 mg Per Tube Daily  ? guaiFENesin  600 mg Oral BID  ? ipratropium-albuterol  3 mL Nebulization TID  ? mouth rinse  15 mL Mouth Rinse BID  ? midodrine  10 mg Oral TID WC  ? polyethylene glycol  17 g Per Tube Daily  ? pramipexole  1 mg Oral BID  ? sodium chloride flush  10-40 mL Intracatheter Q12H  ? ?Continuous Infusions: ? sodium chloride Stopped (11/18/21 0436)  ? ?PRN Meds:.albuterol, sodium chloride flush ? ? ?I have personally reviewed following labs and imaging studies ? ?LABORATORY DATA: ?CBC: ?Recent Labs  ?Lab 11/15/21 ?2325 11/16/21 ?1136 11/17/21 ?0442 11/18/21 ?0045 11/19/21 ?0143 11/20/21 ?0157  ?WBC 17.2* 12.8* 14.1* 16.2* 16.7* 18.6*  ?NEUTROABS 15.8*  --   --   --   --   --   ?HGB 13.3 12.4 11.5* 11.2* 10.9* 10.9*  ?HCT 46.9* 41.8 38.5 38.3 39.9 39.8  ?MCV 85.0 83.1 79.7* 83.1 86.0 86.5  ?PLT PLATELET CLUMPS NOTED ON SMEAR, COUNT APPEARS ADEQUATE 187 108* 111* 98* 107*  ? ? ? ?Basic Metabolic Panel: ?Recent Labs  ?Lab 11/16/21 ?2023 11/17/21 ?6825 11/18/21 ?0045 11/19/21 ?0143 11/20/21 ?0157  ?NA 138 140 145 144 145  ?K 5.1 4.8 4.2 4.2 5.0  ?CL  104 107 110 108 109  ?CO2 26 26 31 31  32  ?GLUCOSE 114* 115* 98 100* 113*  ?BUN 95* 82* 61* 35* 21  ?CREATININE 2.47* 2.17* 1.62* 1.11* 1.04*  ?CALCIUM 7.9* 8.0* 8.1* 8.1* 8.2*  ?MG  --   --  1.8  --   --   ?PHO

## 2021-11-20 NOTE — Progress Notes (Signed)
Occupational Therapy Treatment ?Patient Details ?Name: [name] ?MRN: [id number] ?DOB: 09-03-1955 ?Today's Date: 11/20/2021 ? ? ?History of present illness Pt is 66 yo female found AMS unresponsive 4/23 in feces with septic shock (+) UA intubated 4/23-4/24.  Pt with PNE. PMH anxiety, breast CA, CAD, nstemi 2008, COPD depression, HLD, HTN MI, stable angina, stress incontinence, lumpectomy 2012, R elbow surg 2008 ?  ?OT comments ? Pt slightly sleepy today but able to participate in therapy.  She was able to complete supine to sit with mod assist and perform stand pivot transfer from the bed to the bedside chair.  She was oriented to place and month but not day of the week.    ? ?Recommendations for follow up therapy are one component of a multi-disciplinary discharge planning process, led by the attending physician.  Recommendations may be updated based on patient status, additional functional criteria and insurance authorization. ?   ?Follow Up Recommendations ? Acute inpatient rehab (3hours/day)  ?  ?Assistance Recommended at Discharge Frequent or constant Supervision/Assistance  ?Patient can return home with the following ? A little help with walking and/or transfers;A little help with bathing/dressing/bathroom;Assistance with cooking/housework;Help with stairs or ramp for entrance;Assist for transportation ?  ?Equipment Recommendations ? Other (comment) (TBD next venue of care)  ?  ?Recommendations for Other Services Rehab consult ? ?  ?Precautions / Restrictions Precautions ?Precautions: Fall ?Precaution Comments: fall risk watch BP, HFNC 15L ?Restrictions ?Weight Bearing Restrictions: No  ? ? ?  ? ?Mobility Bed Mobility ?Overal bed mobility: Needs Assistance ?Bed Mobility: Supine to Sit ?  ?  ?Supine to sit: Min assist (HOB elevated 45 degrees) ?  ?  ?  ?  ? ?Transfers ?Overall transfer level: Needs assistance ?  ?Transfers: Sit to/from Stand, Bed to chair/wheelchair/BSC ?Sit to Stand: Mod assist ?  ?  ?Step  pivot transfers: Max assist ?  ?  ?  ?  ?  ?Balance Overall balance assessment: Needs assistance ?Sitting-balance support: Feet supported ?Sitting balance-Leahy Scale: Good ?  ?  ?Standing balance support: Bilateral upper extremity supported, Reliant on assistive device for balance ?Standing balance-Leahy Scale: Poor ?  ?  ?  ?  ?  ?  ?  ?  ?  ?  ?  ?  ?   ? ?ADL either performed or assessed with clinical judgement  ? ?ADL Overall ADL's : Needs assistance/impaired ?  ?  ?  ?  ?  ?  ?  ?  ?  ?  ?  ?  ?  ?  ?  ?  ?  ?  ?  ?General ADL Comments: Pt declined completion of grooming tasks secondary to having assistance with this earlier.  Therapist did assist with changing her gown secondary to reporting spilling oatmel on it this morning.  O2 sats remained 90-92% on 6Ls high flow nasal cannula throughout session.  Max assist for stand pivot from the bed to the recliner for lunch. ?  ? ? ?   ?   ?   ? ?Cognition Arousal/Alertness: Awake/alert ?Behavior During Therapy: Flat affect (slightly sleepy) ?Overall Cognitive Status: Impaired/Different from baseline ?Area of Impairment: Orientation, Memory ?  ?  ?  ?  ?  ?  ?  ?  ?Orientation Level: Time ?  ?  ?  ?  ?Awareness: Intellectual ?Problem Solving: Slow processing, Requires verbal cues ?  ?  ?  ?   ?   ?   ?   ? ? ?  Pertinent Vitals/ Pain       Pain Assessment ?Pain Assessment: No/denies pain ?Faces Pain Scale: No hurt ? ?   ?   ? ?Frequency ? Min 2X/week  ? ? ? ? ?  ?Progress Toward Goals ? ?OT Goals(current goals can now be found in the care plan section) ? Progress towards OT goals: Progressing toward goals ? ?Acute Rehab OT Goals ?Patient Stated Goal: Did not state this session but agreeable to OOB ?OT Goal Formulation: With patient ?Time For Goal Achievement: 12/02/21 ?Potential to Achieve Goals: Good  ?Plan Discharge plan remains appropriate   ? ?   ?AM-PAC OT "6 Clicks" Daily Activity     ?Outcome Measure ? ? Help from another person eating meals?: A Little ?Help  from another person taking care of personal grooming?: A Little ?Help from another person toileting, which includes using toliet, bedpan, or urinal?: A Lot ?Help from another person bathing (including washing, rinsing, drying)?: A Lot ?Help from another person to put on and taking off regular upper body clothing?: A Little ?Help from another person to put on and taking off regular lower body clothing?: A Lot ?6 Click Score: 15 ? ?  ?End of Session Equipment Utilized During Treatment: Oxygen ? ?OT Visit Diagnosis: Unsteadiness on feet (R26.81);Muscle weakness (generalized) (M62.81) ?  ?Activity Tolerance Patient tolerated treatment well ?  ?Patient Left in bed;with call bell/phone within reach;with bed alarm set;with SCD's reapplied ?  ?Nurse Communication Mobility status ?  ? ?   ? ?Time: 1937-9024 ?OT Time Calculation (min): 33 min ? ?Charges: OT General Charges ?$OT Visit: 1 Visit ?OT Treatments ?$Self Care/Home Management : 23-37 mins ? ?Aqua Denslow OTR/L ?11/20/2021, 2:13 PM ?

## 2021-11-20 NOTE — TOC Initial Note (Signed)
Transition of Care (TOC) - Initial/Assessment Note  ? ? ?Patient Details  ?Name: [name] ?MRN: [id number] ?Date of Birth: Dec 02, 1955 ? ?Transition of Care (TOC) CM/SW Contact:    ?Benard Halsted, [name] ?Phone Number: ?11/20/2021, 4:53 PM ? ?Clinical Narrative:                 ?CSW received consult for possible SNF placement at time of discharge if CIR unable to accept. CSW contacted patient's daughter but no voicemail available. CSW spoke with her husband, Erlene Quan, and he stated his wife is tough to get a hold of due to taking care of their kids so he is the best contact. He reported that the patient lives by herself and normally comes to see them so when he had not heard from her, he had to get someone to open her door because she has not given them a key. He expressed understanding of PT recommendation and is agreeable to CIR or SNF placement at time of discharge. He is currently cleaning up patient's home. They live in [place]. CSW discussed insurance authorization process and will provide Medicare SNF ratings list if CIR unable to accept patient. CSW emailed [employer], medical alert resources, and private duty listing as requested ([email or web address]).  ? ? ? ? ?Skilled Nursing Rehab Facilities-   [email or web address]   Ratings out of 5 possible   ?Name Address  Phone # Quality Care Staffing Health Inspection Overall  ?[facility] [address] [place] '4 5 2 3  '$ ?[place], [place] [phone] '3 2 5 5  '$ ?[facility] '3 1 1 1  '$ ?[facility] [place], [place] '3 2 4 4  '$ ?[facility] [address] [place] '1 1 2 1  '$ ?[place] '2 1 4 3  '$ ?[facility] [address] [place] '5 2 3 4  '$ ?[facility] [address] [place] '5 2 2 3  '$ ?[address] ([place]) [address] [place] [phone] '5 1 2 2  '$ ?[facility] [address], [place] [phone] '4 1 2 1  '$ ?[facility] [address], [place] [phone] '4 1 2 1  '$ ?[facility] ([place]) [place], [place] [phone] '4 1 1 1  '$ ?[facility] [address] [place] [phone] '3 2 4 4  '$ ?        ?[place], [place]      ?[facility] [place] '4 2 3 3  '$ ?[facility] [place], [place] '4 1 5 4  '$ ?[address] [address] [place] [address], [place] [phone] '2 1 1 1  '$ ?[facility] [address] [place] [phone] '2 1 3 2  '$ ?        ?[address] (no [facility]) [address], [place] [phone] '4 5 5 5  '$ ?[facility] (No [employer]) [address], [place] '3 1 4 3  '$ ?[facility] (No [employer]) [address], [place] [phone] '5 5 5 5  '$ ?[facility] [address] [place] [phone] '3 2 4 4  '$ ?[facility] [address], [place] '1 1 2 1  '$ ?[place], [place] [phone] '2 1 1 1  '$ ?[facility] [place] '5 2 4 5  '$ ?[facility] [address] [place] '3 1 1 1  '$ ?[facility] [place], [place] '2 1 2 1  '$ ?        ?[facility] [address], [place] '1 1 1 1  '$ ?[facility] [address] [place]  [phone] '2 4 2 2  '$ ?[facility]  [address], [place] [phone] '5 2 3 4  '$ ?[facility] [place] [address], [place] '2 1 1 1  '$ ?[place] (No [employer]) [address], [place] '2 1 3 2  '$ ?[facility] [address] [place] [phone] '3 1 1 1  '$ ?        ?[facility] [place], [place] '5 4 5 5  '$ ?[facility])  [address], [place] '2 2 3 3  '$ ?[facility]) [place], [place] '3 2 4 4  '$ ?[facility] [address], [place] '4 3 4 4  '$ ?[address] [place], [place] '3 3 1 1  '$ ?[facility]) [place], [place] '2 2 4 4  '$ ? ? ? ?  ?Barriers to Discharge: Continued Medical Work up, Ship broker, SNF Pending bed offer ? ? ?Patient Goals and CMS Choice ?Patient states their goals for this hospitalization and ongoing recovery are:: Rehab ?CMS Medicare.gov Compare Post Acute Care list provided to:: Patient Represenative (must comment) ?Choice offered to / list presented to : Adult Children ? ?Expected Discharge Plan and Services ?  ?In-house Referral: Clinical Social Work ?  ?Post Acute Care Choice: [facility]hab, [place] ?Living arrangements for the past 2 months: [place] ?                ?  ?  ?  ?  ?  ?  ?  ?  ?  ?  ? ?Prior Living Arrangements/Services ?Living arrangements for the past 2 months: [place] ?Lives with:: Self ?Patient language and need for interpreter reviewed:: Yes ?Do you feel safe going back to the place where you live?: Yes      ?Need for Family Participation in Patient Care: Yes (Comment) ?Care giver support system in place?: Yes (comment) ?  ?Criminal Activity/Legal Involvement Pertinent to Current Situation/Hospitalization: No - Comment as needed ? ?Activities of Daily Living ?  ?  ? ?Permission Sought/Granted ?Permission sought to share information with : Facility Sport and exercise psychologist, Family Supports ?Permission granted to share information with : Yes, Verbal Permission Granted ? Share Information with NAME: Erlene Quan ? Permission granted to share info w AGENCY: SNFs ? Permission granted to share info w Relationship: Son in law ? Permission granted to share info w Contact Information:  [phone] ? ?Emotional Assessment ?Appearance:: Appears stated age ?Attitude/Demeanor/Rapport: Unable to Assess ?Affect (typically observed): Unable to Assess ?Orientation: : Oriented to Self, Oriented to Place ?Alcohol / Substance Use: Not Applicable ?Psych Involvement: No (comment) ? ?Admission diagnosis:  Hyperkalemia [E87.5] ?Encephalopathy [G93.40] ?Shock liver [K72.00] ?AKI (acute kidney injury) ([place]) [N17.9] ?Septic shock ([place]) [A41.9, R65.21] ?Acute on chronic respiratory failure with hypoxia and hypercapnia (HCC) [J96.21, J96.22] ?Patient Active Problem List  ? Diagnosis Date Noted  ? Septic shock ([place]) 11/16/2021  ? Pressure injury of skin 11/16/2021  ? Depression 04/08/2016  ? Morbid obesity ([place]) 12/20/2015  ? Type 2 diabetes mellitus ([place]) 04/18/2015  ? Restless leg syndrome 03/12/2015  ?  Vitamin D deficiency 03/12/2015  ? Generalized anxiety disorder 02/14/2014  ? Malignant neoplasm of upper-inner quadrant of female breast ([place]) 07/30/2011  ? Myocardial infarction [facility])   ? Hypertension   ? Hyperlipidemia   ? CAD (coronary artery disease)   ? COPD (chronic obstructive pulmonary disease) ([place])   ? Carotid stenosis   ? ?PCP:  [name], FNP ?Pharmacy:   ?[facility] [address] ?Phone: [phone] Fax: [phone] ? ?[place], [place] ?[place] [address] ?Phone: [phone] Fax: [phone] ? ? ? ? ?Social Determinants of Health (SDOH) Interventions ?  ? ?Readmission Risk Interventions ?   ? View : No data to display.  ?  ?  ?  ? ? ? ?

## 2021-11-20 NOTE — Progress Notes (Signed)
Speech Language Pathology Treatment: Dysphagia  ?Patient Details ?Name: [name] ?MRN: [id number] ?DOB: [birth year] ?Today's Date: 11/20/2021 ?Time: 3709-6438 ?SLP Time Calculation (min) (ACUTE ONLY): 20 min ? ?Assessment / Plan / Recommendation ?Clinical Impression ? Pt has a baseline cough noted also at times during PO intake, but per RN, cough has not appeared to be directly correlated with times of PO intake. Oropharyngeal swallow appears to be fairly functional overall, although she is quite distractible and needs cues for redirection to PO intake. Note that she also appears to have spilled a lot of her breakfast tray across herself and her table. She voices her preference for softer foods in smaller pieces, so in discussion with her, will change diet to mechanical soft, continuing with thin liquids. In light of mentation, pt would likely benefit from assist during meals both to optimize intake but also for swallowing safety. Will f/u and consider instrumental swallow study if cough persists. Given noted confusion and word-finding difficulties, recommend keeping a close eye on mentation prior to discharge as well. ?  ?HPI HPI: Patient is a 66 year old female PMH of anxiety/depression, CAD, HTN, HLD, CKD, COPD, breast cancer presents to [facility] ED on 4/22 with AMS. Pt fouind on sofa not responsive and covered in fecces. Found to have septic shock, UA with small leukocytes, LLL opacity and acute respiratory failure requiring intubation 4/23-4/24. ?  ?   ?SLP Plan ? Continue with current plan of care ? ?  ?  ?Recommendations for follow up therapy are one component of a multi-disciplinary discharge planning process, led by the attending physician.  Recommendations may be updated based on patient status, additional functional criteria and insurance authorization. ?  ? ?Recommendations  ?Diet recommendations: Dysphagia 3 (mechanical soft);Thin liquid ?Liquids provided via: Cup;Straw ?Medication Administration: Whole  meds with puree ?Supervision: Staff to assist with self feeding ?Compensations: Slow rate;Small sips/bites ?Postural Changes and/or Swallow Maneuvers: Seated upright 90 degrees  ?   ?    ?   ? ? ? ? Oral Care Recommendations: Oral care BID ?Follow Up Recommendations: No SLP follow up ?Assistance recommended at discharge: Frequent or constant Supervision/Assistance ?SLP Visit Diagnosis: Dysphagia, unspecified (R13.10) ?Plan: Continue with current plan of care ? ? ? ? ?  ?  ? ? ?Osie Bond., M.A. CCC-SLP ?Acute Rehabilitation Services ?Office [phone] ? ?Secure chat preferred ? ? ?11/20/2021, 11:48 AM ?

## 2021-11-21 ENCOUNTER — Inpatient Hospital Stay (HOSPITAL_COMMUNITY): Payer: Medicare HMO

## 2021-11-21 DIAGNOSIS — A419 Sepsis, unspecified organism: Secondary | ICD-10-CM | POA: Diagnosis not present

## 2021-11-21 DIAGNOSIS — G9340 Encephalopathy, unspecified: Secondary | ICD-10-CM | POA: Diagnosis not present

## 2021-11-21 DIAGNOSIS — N179 Acute kidney failure, unspecified: Secondary | ICD-10-CM | POA: Diagnosis not present

## 2021-11-21 DIAGNOSIS — J9621 Acute and chronic respiratory failure with hypoxia: Secondary | ICD-10-CM | POA: Diagnosis not present

## 2021-11-21 LAB — COMPREHENSIVE METABOLIC PANEL
ALT: 216 U/L — ABNORMAL HIGH (ref 0–44)
AST: 19 U/L (ref 15–41)
Albumin: 1.9 g/dL — ABNORMAL LOW (ref 3.5–5.0)
Alkaline Phosphatase: 67 U/L (ref 38–126)
Anion gap: 6 (ref 5–15)
BUN: 14 mg/dL (ref 8–23)
CO2: 33 mmol/L — ABNORMAL HIGH (ref 22–32)
Calcium: 8.3 mg/dL — ABNORMAL LOW (ref 8.9–10.3)
Chloride: 104 mmol/L (ref 98–111)
Creatinine, Ser: 0.86 mg/dL (ref 0.44–1.00)
GFR, Estimated: >60 mL/min (ref >60)
Glucose, Bld: 199 mg/dL — ABNORMAL HIGH (ref 70–99)
Potassium: 4.1 mmol/L (ref 3.5–5.1)
Sodium: 143 mmol/L (ref 135–145)
Total Bilirubin: 0.5 mg/dL (ref 0.3–1.2)
Total Protein: 4.9 g/dL — ABNORMAL LOW (ref 6.5–8.1)

## 2021-11-21 LAB — CULTURE, BLOOD (ROUTINE X 2): Culture: NO GROWTH

## 2021-11-21 LAB — CBC
HCT: 37.7 % (ref 36.0–46.0)
Hemoglobin: 10.6 g/dL — ABNORMAL LOW (ref 12.0–15.0)
MCH: 24.3 pg — ABNORMAL LOW (ref 26.0–34.0)
MCHC: 28.1 g/dL — ABNORMAL LOW (ref 30.0–36.0)
MCV: 86.3 fL (ref 80.0–100.0)
Platelets: 108 10*3/uL — ABNORMAL LOW (ref 150–400)
RBC: 4.37 MIL/uL (ref 3.87–5.11)
RDW: 18.4 % — ABNORMAL HIGH (ref 11.5–15.5)
WBC: 16.3 10*3/uL — ABNORMAL HIGH (ref 4.0–10.5)
nRBC: 0 % (ref 0.0–0.2)

## 2021-11-21 LAB — GLUCOSE, CAPILLARY
Glucose-Capillary: 112 mg/dL — ABNORMAL HIGH (ref 70–99)
Glucose-Capillary: 131 mg/dL — ABNORMAL HIGH (ref 70–99)
Glucose-Capillary: 136 mg/dL — ABNORMAL HIGH (ref 70–99)
Glucose-Capillary: 159 mg/dL — ABNORMAL HIGH (ref 70–99)
Glucose-Capillary: 225 mg/dL — ABNORMAL HIGH (ref 70–99)
Glucose-Capillary: 79 mg/dL (ref 70–99)

## 2021-11-21 LAB — BRAIN NATRIURETIC PEPTIDE: B Natriuretic Peptide: 1738.3 pg/mL — ABNORMAL HIGH (ref 0.0–100.0)

## 2021-11-21 MED ORDER — FUROSEMIDE 10 MG/ML IJ SOLN
40.0000 mg | Freq: Two times a day (BID) | INTRAMUSCULAR | Status: DC
  Administered 2021-11-21 – 2021-11-25 (×9): 40 mg via INTRAVENOUS
  Filled 2021-11-21 (×9): qty 4

## 2021-11-21 MED ORDER — POTASSIUM CHLORIDE CRYS ER 20 MEQ PO TBCR
20.0000 meq | EXTENDED_RELEASE_TABLET | Freq: Every day | ORAL | Status: DC
  Administered 2021-11-21 – 2021-11-23 (×3): 20 meq via ORAL
  Filled 2021-11-21 (×3): qty 1

## 2021-11-21 MED ORDER — IPRATROPIUM-ALBUTEROL 0.5-2.5 (3) MG/3ML IN SOLN
3.0000 mL | Freq: Two times a day (BID) | RESPIRATORY_TRACT | Status: DC
  Administered 2021-11-21 – 2021-11-25 (×7): 3 mL via RESPIRATORY_TRACT
  Filled 2021-11-21 (×6): qty 3

## 2021-11-21 NOTE — Progress Notes (Addendum)
Physical Therapy Treatment ?Patient Details ?Name: [name] ?MRN: [id number] ?DOB: [birth year] ?Today's Date: 11/21/2021 ? ? ?History of Present Illness Pt is 66 yo female found AMS unresponsive 4/23 in feces with septic shock (+) UA intubated 4/23-4/24.  Pt with PNE. PMH anxiety, breast CA, CAD, nstemi 2008, COPD depression, HLD, HTN MI, stable angina, stress incontinence, lumpectomy 2012, R elbow surg 2008 ? ?  ?PT Comments  ? ? Pt admitted with above diagnosis. Pt was able to ambulate a few feet and needed to use 3N1 as well as having chest pain around left breast. Nurse made aware and came in to assess pt.  The pain eased and nurse felt it was muscular due to VSS.  Pt was assisted back to bed after being on toilet per pt request. Bed was changed prior to getting pt back in bed.  Updated D/c plan to SNF as that is the plan per chart for pt.  Also decr frequency to 2x week per guidelines. Pt currently with functional limitations due to balance and endurance deficits. Pt will benefit from skilled PT to increase their independence and safety with mobility to allow discharge to the venue listed below.      ?Recommendations for follow up therapy are one component of a multi-disciplinary discharge planning process, led by the attending physician.  Recommendations may be updated based on patient status, additional functional criteria and insurance authorization. ? ?Follow Up Recommendations ? Skilled nursing-short term rehab (<3 hours/day) ?  ?  ?Assistance Recommended at Discharge Frequent or constant Supervision/Assistance  ?Patient can return home with the following A lot of help with walking and/or transfers;A lot of help with bathing/dressing/bathroom ?  ?Equipment Recommendations ? Rolling walker (2 wheels);Wheelchair cushion (measurements PT);Wheelchair (measurements PT);BSC/3in1  ?  ?Recommendations for Other Services   ? ? ?  ?Precautions / Restrictions Precautions ?Precautions: Fall ?Precaution Comments:  fall risk watch BP, HFNC 7L ?Restrictions ?Weight Bearing Restrictions: No  ?  ? ?Mobility ? Bed Mobility ?Overal bed mobility: Needs Assistance ?Bed Mobility: Supine to Sit ?Rolling: Min assist ?  ?Supine to sit: Min assist (HOB elevated 45 degrees) ?Sit to supine: Min assist ?  ?General bed mobility comments: Cues for sequencing and increased time able to get to EOB with light min A. ?  ? ?Transfers ?Overall transfer level: Needs assistance ?Equipment used: Rolling walker (2 wheels) ?Transfers: Sit to/from Stand, Bed to chair/wheelchair/BSC ?Sit to Stand: Min assist ?Stand pivot transfers: Min assist, +2 safety/equipment ?  ?  ?  ?  ?General transfer comment: Pt was wet on arrival and NT was bathing pt therefore assisted to EOB for pt to stand to be cleaned well.  Pt performed sit to stand with min assist and cues, use of momentum, and light min A of 2 for safety.  cues for hand placement ?  ? ?Ambulation/Gait ?Ambulation/Gait assistance: +2 safety/equipment, Min assist ?Gait Distance (Feet): 10 Feet (5 feet x 2) ?Assistive device: Rolling walker (2 wheels) ?Gait Pattern/deviations: Step-to pattern, Decreased stride length, Shuffle, Trunk flexed, Wide base of support, Drifts right/left ?Gait velocity: decreased ?Gait velocity interpretation: <1.31 ft/sec, indicative of household ambulator ?  ?General Gait Details: Pt ambulating 5' to [facility] commode and had left chest/breast pain. Notified nursing and nursing came in and assessed pt.  It did ease with time and nurse felt it may be a muscle pain.  HR 106 bpm and other VSS.  Total assist for pt to clean herself.  Min A for balance, chairs  kept close, +2 for safety.  Pt on 7LO2 with sats >90%. ? ? ?Stairs ?  ?  ?  ?  ?  ? ? ?Wheelchair Mobility ?  ? ?Modified Rankin (Stroke Patients Only) ?  ? ? ?  ?Balance Overall balance assessment: Needs assistance ?Sitting-balance support: Feet supported ?Sitting balance-Leahy Scale: Good ?  ?  ?Standing balance support: Bilateral  upper extremity supported, Reliant on assistive device for balance ?Standing balance-Leahy Scale: Poor ?Standing balance comment: Requiring RW and min A static, min A  dynamic ?  ?  ?  ?  ?  ?  ?  ?  ?  ?  ?  ?  ? ?  ?Cognition Arousal/Alertness: Awake/alert ?Behavior During Therapy: Flat affect ?Overall Cognitive Status: Impaired/Different from baseline ?Area of Impairment: Orientation, Memory ?  ?  ?  ?  ?  ?  ?  ?  ?Orientation Level: Time ?  ?Memory: Decreased short-term memory ?  ?  ?Awareness: Intellectual ?Problem Solving: Slow processing, Requires verbal cues ?  ?  ?  ? ?  ?Exercises General Exercises - Lower Extremity ?Ankle Circles/Pumps: AROM, Both, 10 reps, Seated ?Long Arc Quad: Both, 10 reps, Seated, AROM ?Heel Slides: AROM, Both, 5 reps, Supine ?Hip Flexion/Marching: AROM, Both, 10 reps, Seated ? ?  ?General Comments General comments (skin integrity, edema, etc.): Pt on 7LHFNC with sats >90%.  Pt HR 106 - 113 bpm. ?  ?  ? ?Pertinent Vitals/Pain Pain Assessment ?Pain Assessment: Faces ?Faces Pain Scale: Hurts whole lot ?Pain Location: L chest pain ?Pain Descriptors / Indicators: Grimacing ?Pain Intervention(s): Limited activity within patient's tolerance, Monitored during session, Repositioned, Patient requesting pain meds-RN notified  ? ? ?Home Living   ?  ?  ?  ?  ?  ?  ?  ?  ?  ?   ?  ?Prior Function    ?  ?  ?   ? ?PT Goals (current goals can now be found in the care plan section) Acute Rehab PT Goals ?Patient Stated Goal: get back to dog Yoshi ?Progress towards PT goals: Progressing toward goals ? ?  ?Frequency ? ? ? Min 2X/week ? ? ? ?  ?PT Plan Discharge plan needs to be updated;Frequency needs to be updated  ? ? ?Co-evaluation   ?  ?  ?  ?  ? ?  ?AM-PAC PT "6 Clicks" Mobility   ?Outcome Measure ? Help needed turning from your back to your side while in a flat bed without using bedrails?: A Little ?Help needed moving from lying on your back to sitting on the side of a flat bed without using  bedrails?: A Lot ?Help needed moving to and from a bed to a chair (including a wheelchair)?: A Lot ?Help needed standing up from a chair using your arms (e.g., wheelchair or bedside chair)?: A Lot ?Help needed to walk in hospital room?: Total ?Help needed climbing 3-5 steps with a railing? : Total ?6 Click Score: 11 ? ?  ?End of Session Equipment Utilized During Treatment: Gait belt;Oxygen ?Activity Tolerance: Patient limited by fatigue ?Patient left: with call bell/phone within reach;with bed alarm set;in bed ?Nurse Communication: Mobility status ?PT Visit Diagnosis: Other abnormalities of gait and mobility (R26.89);Muscle weakness (generalized) (M62.81);History of falling (Z91.81) ?  ? ? ?Time: 3875-6433 ?PT Time Calculation (min) (ACUTE ONLY): 27 min ? ?Charges:  $Gait Training: 8-22 mins ?$Self Care/Home Management: 8-22          ?          ? ?  Yohan Samons M,PT ?Acute Rehab Services ?[phone] ?[phone] (pager)  ? ? ?Alvira Philips ?11/21/2021, 2:53 PM ? ?

## 2021-11-21 NOTE — Progress Notes (Signed)
?      ?                 PROGRESS NOTE ? ?      ?PATIENT DETAILS ?Name: [name] ?Age: 66 y.o. ?Sex: female ?Date of Birth: 10-03-1955 ?Admit Date: 11/15/2021 ?Admitting Physician Lanier Clam, MD ?EXB:MWUXLK, [name], FNP ? ?Brief Summary: ?Patient is a 66 y.o.  female with history of CAD s/p PCI, HTN, HLD COPD, CKD stage IIIa-who presented to the hospital with altered mental status-felt to have acute metabolic encephalopathy-septic shock due to PNA.  Intubated and admitted by PCCM-upon stability-transfer to [facility]. ? ?Significant events: ?4/22>> presented to [facility] ED with encephalopathy, shock-found to have PNA-intubated and transferred to [facility]. ?4/24>> extubated ?4/26>> transfer to [facility]. ? ?Significant studies: ?4/22>> CT head: No acute intracranial abnormality. ?4/23>> CXR: Increased opacity in left lower lobe. ?4/23>> CT chest/abdomen/pelvis: Extensive left-sided PNA, thyroid nodules. ?4/23>> CT head: No acute intracranial abnormality. ? ?Significant microbiology data: ?4/22>> influenza/COVID PCR: Negative ?4/22>> blood culture: Negative ?4/23>> blood culture: Negative ?4/23>> urine culture: E. 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 ? ?Procedures: ?4/22-4/24>> ETT ? ?Consults: ?None  ? ?Subjective: ?More awake and alert-yesterday in the afternoon she had a episode of somnolence after she was given Xanax.  Still on around 7-8 L of HFNC. ? ?Objective: ?Vitals: ?Blood pressure 115/65, pulse 93, temperature 98.3 ?F (36.8 ?C), temperature source Oral, resp. rate (!) 25, height 5' 5"  (1.651 m), weight 108.6 kg, last menstrual period 10/17/2005, SpO2 94 %.  ? ?Exam: ?Gen Exam:Alert awake-not in any distress ?HEENT:atraumatic, normocephalic ?Chest: Bibasilar rales. ?CVS:S1S2 regular ?Abdomen:soft non tender, non distended ?Extremities:+ edema ?Neurology: Non focal ?Skin: no rash  ? ?Pertinent Labs/Radiology: ? ?  Latest Ref Rng & Units  11/21/2021  ?  2:47 AM 11/20/2021  ?  1:57 AM 11/19/2021  ?  1:43 AM  ?CBC  ?WBC 4.0 - 10.5 K/uL 16.3   18.6   16.7    ?Hemoglobin 12.0 - 15.0 g/dL 10.6   10.9   10.9    ?Hematocrit 36.0 - 46.0 % 37.7   39.8   39.9    ?Platelets 150 - 400 K/uL 108   107   98    ?  ?Lab Results  ?Component Value Date  ? NA 143 11/21/2021  ? K 4.1 11/21/2021  ? CL 104 11/21/2021  ? CO2 33 (H) 11/21/2021  ? ?  ? ? ?Assessment/Plan: ?Septic shock: Due to PNA-sepsis physiology has improved-she will complete a 7-day course of antimicrobial therapy today. ? ?Acute metabolic encephalopathy: Multifactorial in the setting of sepsis-with some contributions from benzos, mild hypercarbia.  Encephalopathy has improved-CT head x2 negative for acute abnormalities. ? ?Acute hypoxic respiratory failure: Likely due to PNA-with some possible acute HFpEF.  Will complete IV antibiotics today-restarting IV Lasix.  Continue to attempt to mobilize-encourage incentive spirometry-and continue to attempt to slowly titrate down FiO2.   ? ?AKI on CKD stage IIIa: AKI hemodynamically mediated-creatinine has now normalized. ? ?Shock liver: Again improvement in transaminases-follow periodically. ? ?CAD s/p PCI to RCA in 2012: No anginal symptoms-continue ASA.  Reviewed last cardiology-last [facility] 10/07/2020-showed stable CAD-recommendations were for medical management.  Resume statin/beta-blocker once LFTs and BP permit. ? ?Acute on chronic HFpEF: Some mild volume overload-some trace edema-CXR on 4/28 with possible pulm edema-given persistent hypoxemia requiring HFNC around 7-8 L-we will restart IV Lasix and attempt diuresis. ? ?HTN: BP stable-on low-dose midodrine.  Antihypertensives remain on  hold ? ?Anxiety/depression: Appears relatively stable-Xanax dose lowered-and changed to as needed dosing-given somnolence on 4/27-remains on Lexapro.  ? ?Restless leg syndrome: On Mirapex. ? ?History of breast cancer: Stable for outpatient follow-up with PCP. ? ?COPD: Not in  exacerbation-continue bronchodilators. ? ?Probable OSA: Given hypercarbia-PCCM recommending BiPAP nightly. ? ?Bilateral thyroid nodule: Seen incidentally on CT chest-stable for outpatient follow-up with PCP.  TSH stable. ? ?Pressure Ulcer: ?Pressure Injury 11/16/21 Buttocks Left;Medial Stage 2 -  Partial thickness loss of dermis presenting as a shallow open injury with a red, pink wound bed without slough. Red open area (Active)  ?11/16/21 1030  ?Location: Buttocks  ?Location Orientation: Left;Medial  ?Staging: Stage 2 -  Partial thickness loss of dermis presenting as a shallow open injury with a red, pink wound bed without slough.  ?Wound Description (Comments): Red open area  ?Present on Admission: Yes  ?Dressing Type Foam - Lift dressing to assess site every shift 11/20/21 0700  ? ?Obesity: ?Estimated body mass index is 39.84 kg/m? as calculated from the following: ?  Height as of this encounter: 5' 5"  (1.651 m). ?  Weight as of this encounter: 108.6 kg.  ? ?Code status: ?  Code Status: Prior  ? ?DVT Prophylaxis: ?enoxaparin (LOVENOX) injection 40 mg Start: 11/20/21 2200 ?SCDs Start: 11/16/21 1312 ?  ?Family Communication: Son in law-Brandon [phone]-left a voicemail on 4/28. ? ? ?Disposition Plan: ?Status is: Inpatient ?Remains inpatient appropriate because: Resolving hypoxia-still on 8-9 L of oxygen-not yet stable for discharge. ?  ?Planned Discharge Destination: [facility] ? ? ?Diet: ?Diet Order   ? ?       ?  DIET DYS 3 Room service appropriate? Yes; Fluid consistency: Thin  Diet effective now       ?  ? ?  ?  ? ?  ?  ? ? ?Antimicrobial agents: ?Anti-infectives (From admission, onward)  ? ? Start     Dose/Rate Route Frequency Ordered Stop  ? 11/21/21 1000  cefTRIAXone (ROCEPHIN) 2 g in sodium chloride 0.9 % 100 mL IVPB       ? 2 g ?200 mL/hr over 30 Minutes Intravenous Every 24 hours 11/20/21 1128 11/21/21 1149  ? 11/18/21 1000  cefTRIAXone (ROCEPHIN) 2 g in sodium chloride 0.9 % 100 mL IVPB       ? 2  g ?200 mL/hr over 30 Minutes Intravenous Every 24 hours 11/18/21 0906 11/20/21 1357  ? 11/16/21 2200  ceFEPIme (MAXIPIME) 2 g in sodium chloride 0.9 % 100 mL IVPB  Status:  Discontinued       ? 2 g ?200 mL/hr over 30 Minutes Intravenous Every 24 hours 11/16/21 0627 11/18/21 0906  ? 11/16/21 1000  vancomycin (VANCOREADY) IVPB 1250 mg/250 mL  Status:  Discontinued       ? 1,250 mg ?166.7 mL/hr over 90 Minutes Intravenous Every 48 hours 11/16/21 0627 11/17/21 0844  ? 11/16/21 0015  ceFEPIme (MAXIPIME) 2 g in sodium chloride 0.9 % 100 mL IVPB       ? 2 g ?200 mL/hr over 30 Minutes Intravenous  Once 11/16/21 0001 11/16/21 0054  ? 11/16/21 0015  metroNIDAZOLE (FLAGYL) IVPB 500 mg       ? 500 mg ?100 mL/hr over 60 Minutes Intravenous  Once 11/16/21 0001 11/16/21 0147  ? 11/16/21 0015  vancomycin (VANCOCIN) IVPB 1000 mg/200 mL premix       ? 1,000 mg ?200 mL/hr over 60 Minutes Intravenous  Once 11/16/21 0001 11/16/21 0246  ? ?  ? ? ? ?  MEDICATIONS: ?Scheduled Meds: ? arformoterol  15 mcg Nebulization BID  ? aspirin  81 mg Per Tube Daily  ? budesonide (PULMICORT) nebulizer solution  0.5 mg Nebulization BID  ? chlorhexidine gluconate (MEDLINE KIT)  15 mL Mouth Rinse BID  ? Chlorhexidine Gluconate Cloth  6 each Topical Daily  ? docusate  100 mg Per Tube BID  ? enoxaparin (LOVENOX) injection  40 mg Subcutaneous Q24H  ? escitalopram  20 mg Per Tube Daily  ? furosemide  40 mg Intravenous Q12H  ? guaiFENesin  600 mg Oral BID  ? ipratropium-albuterol  3 mL Nebulization TID  ? mouth rinse  15 mL Mouth Rinse BID  ? midodrine  10 mg Oral TID WC  ? polyethylene glycol  17 g Per Tube Daily  ? potassium chloride  20 mEq Oral Daily  ? pramipexole  1 mg Oral BID  ? sodium chloride flush  10-40 mL Intracatheter Q12H  ? ?Continuous Infusions: ? sodium chloride Stopped (11/18/21 0436)  ? ?PRN Meds:.albuterol, ALPRAZolam, sodium chloride flush ? ? ?I have personally reviewed following labs and imaging studies ? ?LABORATORY DATA: ?CBC: ?Recent  Labs  ?Lab 11/15/21 ?2325 11/16/21 ?1136 11/17/21 ?0442 11/18/21 ?0045 11/19/21 ?0143 11/20/21 ?0157 11/21/21 ?0600  ?WBC 17.2*   < > 14.1* 16.2* 16.7* 18.6* 16.3*  ?NEUTROABS 15.8*  --   --   --   --   --   --   ?HG

## 2021-11-22 DIAGNOSIS — G9340 Encephalopathy, unspecified: Secondary | ICD-10-CM | POA: Diagnosis not present

## 2021-11-22 DIAGNOSIS — N179 Acute kidney failure, unspecified: Secondary | ICD-10-CM | POA: Diagnosis not present

## 2021-11-22 DIAGNOSIS — J9621 Acute and chronic respiratory failure with hypoxia: Secondary | ICD-10-CM | POA: Diagnosis not present

## 2021-11-22 DIAGNOSIS — A419 Sepsis, unspecified organism: Secondary | ICD-10-CM | POA: Diagnosis not present

## 2021-11-22 LAB — CBC
HCT: 37.5 % (ref 36.0–46.0)
Hemoglobin: 10.8 g/dL — ABNORMAL LOW (ref 12.0–15.0)
MCH: 24.4 pg — ABNORMAL LOW (ref 26.0–34.0)
MCHC: 28.8 g/dL — ABNORMAL LOW (ref 30.0–36.0)
MCV: 84.8 fL (ref 80.0–100.0)
Platelets: 127 10*3/uL — ABNORMAL LOW (ref 150–400)
RBC: 4.42 MIL/uL (ref 3.87–5.11)
RDW: 18.6 % — ABNORMAL HIGH (ref 11.5–15.5)
WBC: 13.4 10*3/uL — ABNORMAL HIGH (ref 4.0–10.5)
nRBC: 0 % (ref 0.0–0.2)

## 2021-11-22 LAB — COMPREHENSIVE METABOLIC PANEL
ALT: 160 U/L — ABNORMAL HIGH (ref 0–44)
AST: 16 U/L (ref 15–41)
Albumin: 2 g/dL — ABNORMAL LOW (ref 3.5–5.0)
Alkaline Phosphatase: 58 U/L (ref 38–126)
Anion gap: 6 (ref 5–15)
BUN: 11 mg/dL (ref 8–23)
CO2: 35 mmol/L — ABNORMAL HIGH (ref 22–32)
Calcium: 8.7 mg/dL — ABNORMAL LOW (ref 8.9–10.3)
Chloride: 103 mmol/L (ref 98–111)
Creatinine, Ser: 0.88 mg/dL (ref 0.44–1.00)
GFR, Estimated: >60 mL/min (ref >60)
Glucose, Bld: 128 mg/dL — ABNORMAL HIGH (ref 70–99)
Potassium: 4 mmol/L (ref 3.5–5.1)
Sodium: 144 mmol/L (ref 135–145)
Total Bilirubin: 0.6 mg/dL (ref 0.3–1.2)
Total Protein: 5.1 g/dL — ABNORMAL LOW (ref 6.5–8.1)

## 2021-11-22 LAB — TROPONIN I (HIGH SENSITIVITY)
Troponin I (High Sensitivity): 59 ng/L — ABNORMAL HIGH (ref <18)
Troponin I (High Sensitivity): 56 ng/L — ABNORMAL HIGH (ref <18)

## 2021-11-22 LAB — GLUCOSE, CAPILLARY
Glucose-Capillary: 100 mg/dL — ABNORMAL HIGH (ref 70–99)
Glucose-Capillary: 154 mg/dL — ABNORMAL HIGH (ref 70–99)
Glucose-Capillary: 196 mg/dL — ABNORMAL HIGH (ref 70–99)
Glucose-Capillary: 144 mg/dL — ABNORMAL HIGH (ref 70–99)
Glucose-Capillary: 185 mg/dL — ABNORMAL HIGH (ref 70–99)

## 2021-11-22 MED ORDER — ESCITALOPRAM OXALATE 20 MG PO TABS
20.0000 mg | ORAL_TABLET | Freq: Every day | ORAL | Status: DC
  Administered 2021-11-23 – 2021-11-27 (×5): 20 mg via ORAL
  Filled 2021-11-22 (×5): qty 1

## 2021-11-22 MED ORDER — GERHARDT'S BUTT CREAM
TOPICAL_CREAM | Freq: Two times a day (BID) | CUTANEOUS | Status: DC
  Filled 2021-11-22: qty 1

## 2021-11-22 MED ORDER — NITROGLYCERIN 0.4 MG SL SUBL
0.4000 mg | SUBLINGUAL_TABLET | SUBLINGUAL | Status: DC | PRN
  Administered 2021-11-22: 0.4 mg via SUBLINGUAL
  Filled 2021-11-22: qty 1

## 2021-11-22 MED ORDER — POLYETHYLENE GLYCOL 3350 17 G PO PACK
17.0000 g | PACK | Freq: Every day | ORAL | Status: DC
  Filled 2021-11-22 (×2): qty 1

## 2021-11-22 MED ORDER — ASPIRIN 81 MG PO CHEW
81.0000 mg | CHEWABLE_TABLET | Freq: Every day | ORAL | Status: DC
Start: 2021-11-23 — End: 2021-11-27
  Administered 2021-11-23 – 2021-11-27 (×5): 81 mg via ORAL
  Filled 2021-11-22 (×5): qty 1

## 2021-11-22 MED ORDER — CHLORHEXIDINE GLUCONATE 0.12 % MT SOLN
OROMUCOSAL | Status: AC
  Administered 2021-11-22: 15 mL
  Filled 2021-11-22: qty 15

## 2021-11-22 MED ORDER — DOCUSATE SODIUM 50 MG/5ML PO LIQD
100.0000 mg | Freq: Two times a day (BID) | ORAL | Status: DC
  Filled 2021-11-22 (×7): qty 10

## 2021-11-22 MED ORDER — SALINE SPRAY 0.65 % NA SOLN
1.0000 | NASAL | Status: DC | PRN
  Administered 2021-11-22: 1 via NASAL
  Filled 2021-11-22: qty 44

## 2021-11-22 NOTE — Progress Notes (Signed)
?   11/22/21 1900  ?Vitals  ?BP 130/87  ?MAP (mmHg) 96  ?BP Location Right Arm  ?BP Method Automatic  ?Level of Consciousness  ?Level of Consciousness Alert  ?Pain Assessment  ?Pain Scale 0-10  ?Pain Score 5  ?Pain Type Acute pain  ?Pain Location Chest  ?Pain Orientation Mid  ?Pain Descriptors / Indicators Pressure  ?Pain Frequency Intermittent  ?Pain Onset With Activity  ?Pain Intervention(s) Medication (See eMAR)  ?Multiple Pain Sites No  ?ECG Monitoring  ?PR interval 0.15  ?QRS interval 0.07  ?QT interval 0.28  ?QTc interval 0.38  ?CV Strip Heart Rate 111  ?Cardiac Rhythm ST  ? ? ?Patient complaining of substernal chest pressure, 5/10 worse on exertion.  ?Kristopher Oppenheim, DO notified of EKG and pain.  ?Orders received for Nitro and Troponins.  ?

## 2021-11-22 NOTE — Progress Notes (Signed)
?  X-cover Note: ?Received secure chat by bedside RN. Pt complaining of CP. Pt admitted for encephalopathy and septic shock. Transferred to [facility] on on 11-19-2021. ? ?This evening EKG shows new TWI in inferior leads: ? ? ? ? ?Compared to EKG on 11-16-2021 that does not have TWI in inferior leads ? ? ? ?Pt already on ASA 81. Will give NTG and check serial troponins. ? ? ?Kristopher Oppenheim, DO ?[facility] ? ?

## 2021-11-22 NOTE — Progress Notes (Signed)
RT note. ?Patient currently on bipap at this time, sat 94% with no labored breathing noted. Dentures removed at this time and placed in pink personal cup. RT will continue to monitor.  ?

## 2021-11-22 NOTE — Progress Notes (Signed)
Patient declined BIPAP. V60 still at bedside. Patient doesn't wear a CPAP or BIPAP at home. Told patient to call if she changed her mind or felt she was in distress. ?

## 2021-11-22 NOTE — Progress Notes (Signed)
?      ?                 PROGRESS NOTE ? ?      ?PATIENT DETAILS ?Name: [name] ?Age: 66 y.o. ?Sex: female ?Date of Birth: [birth year] ?Admit Date: 11/15/2021 ?Admitting Physician Lanier Clam, MD ?[id number], [name], FNP ? ?Brief Summary: ?Patient is a 66 y.o.  female with history of CAD s/p PCI, HTN, HLD COPD, CKD stage IIIa-who presented to the hospital with altered mental status-felt to have acute metabolic encephalopathy-septic shock due to PNA.  Intubated and admitted by PCCM-upon stability-transfer to [facility]. ? ?Significant events: ?4/22>> presented to [facility] ED with encephalopathy, shock-found to have PNA-intubated and transferred to [facility]. ?4/24>> extubated ?4/26>> transfer to [facility]. ? ?Significant studies: ?4/22>> CT head: No acute intracranial abnormality. ?4/23>> CXR: Increased opacity in left lower lobe. ?4/23>> CT chest/abdomen/pelvis: Extensive left-sided PNA, thyroid nodules. ?4/23>> CT head: No acute intracranial abnormality. ? ?Significant microbiology data: ?4/22>> influenza/COVID PCR: Negative ?4/22>> blood culture: Negative ?4/23>> blood culture: Negative ?4/23>> urine culture: E. 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 ? ?Procedures: ?4/22-4/24>> ETT ? ?Consults: ?None  ? ?Subjective: ?No major issues overnight-tolerated BiPAP well-now on 6 L of salter high flow.  Appears comfortable. ? ?Objective: ?Vitals: ?Blood pressure 122/85, pulse 79, temperature (!) 97.1 ?F (36.2 ?C), temperature source Axillary, resp. rate 17, height 5' 5"  (1.651 m), weight 108.6 kg, last menstrual period 10/17/2005, SpO2 97 %.  ? ?Exam: ?Gen Exam:Alert awake-not in any distress ?HEENT:atraumatic, normocephalic ?Chest: B/L clear to auscultation anteriorly ?CVS:S1S2 regular ?Abdomen:soft non tender, non distended ?Extremities:+ edema ?Neurology: Non focal ?Skin: no rash  ? ?Pertinent Labs/Radiology: ? ?  Latest Ref Rng & Units 11/22/2021  ?  1:12  AM 11/21/2021  ?  2:47 AM 11/20/2021  ?  1:57 AM  ?CBC  ?WBC 4.0 - 10.5 K/uL 13.4   16.3   18.6    ?Hemoglobin 12.0 - 15.0 g/dL 10.8   10.6   10.9    ?Hematocrit 36.0 - 46.0 % 37.5   37.7   39.8    ?Platelets 150 - 400 K/uL 127   108   107    ?  ?Lab Results  ?Component Value Date  ? NA 144 11/22/2021  ? K 4.0 11/22/2021  ? CL 103 11/22/2021  ? CO2 35 (H) 11/22/2021  ? ?  ? ? ?Assessment/Plan: ?Septic shock: Due to PNA-sepsis physiology has improved-has completed a course of 7 days of antimicrobial therapy on 4/28.  She remains stable overnight. ? ?Acute metabolic encephalopathy: Multifactorial in the setting of sepsis-with some contributions from benzos, mild hypercarbia.  Encephalopathy has resolved-CT head x2 negative for acute abnormality.  ? ?Acute hypoxic respiratory failure: Likely due to PNA-with some possible acute HFpEF.  Remains hypoxic but slowly improving-down to around 6 L of salter high flow today.  Discussed with RT-continue to titrate down FiO2 as tolerated.  Remains on IV Lasix-no longer on IV antibiotics  ? ?Acute on chronic HFpEF: Remains volume overloaded-continue IV Lasix-renal function stable-continue to titrate down FiO2.  ? ?AKI on CKD stage IIIa: AKI hemodynamically mediated-creatinine has now normalized. ? ?Shock liver: Again improvement in transaminases-follow periodically. ? ?CAD s/p PCI to RCA in 2012: No anginal symptoms-continue ASA.  Reviewed last cardiology-last [facility] 10/07/2020-showed stable CAD-recommendations were for medical management.  Resume statin/beta-blocker once LFTs and BP permit. ? ?HTN: BP stable-on low-dose midodrine.  Antihypertensives remain on hold ? ?Anxiety/depression: Appears  relatively stable-since developed lethargy with benzodiazepines-Xanax dose lowered and changed to as needed.  Remains on Lexapro.   ? ?Restless leg syndrome: On Mirapex. ? ?History of breast cancer: Stable for outpatient follow-up with PCP. ? ?COPD: Not in exacerbation-continue  bronchodilators. ? ?Probable OSA: Given hypercarbia-PCCM recommending BiPAP nightly. ? ?Bilateral thyroid nodule: Seen incidentally on CT chest-stable for outpatient follow-up with PCP.  TSH stable. ? ?Pressure Ulcer: ?Pressure Injury 11/16/21 Buttocks Left;Medial Stage 2 -  Partial thickness loss of dermis presenting as a shallow open injury with a red, pink wound bed without slough. Red open area (Active)  ?11/16/21 1030  ?Location: Buttocks  ?Location Orientation: Left;Medial  ?Staging: Stage 2 -  Partial thickness loss of dermis presenting as a shallow open injury with a red, pink wound bed without slough.  ?Wound Description (Comments): Red open area  ?Present on Admission: Yes  ?Dressing Type Foam - Lift dressing to assess site every shift 11/20/21 2000  ? ?Obesity: ?Estimated body mass index is 39.84 kg/m? as calculated from the following: ?  Height as of this encounter: 5' 5"  (1.651 m). ?  Weight as of this encounter: 108.6 kg.  ? ?Code status: ?  Code Status: Prior  ? ?DVT Prophylaxis: ?enoxaparin (LOVENOX) injection 40 mg Start: 11/20/21 2200 ?SCDs Start: 11/16/21 1312 ?  ?Family Communication: Son in law-Brandon [phone] 4/29 ? ? ?Disposition Plan: ?Status is: Inpatient ?Remains inpatient appropriate because: Resolving hypoxia-still on 8-9 L of oxygen-not yet stable for discharge. ?  ?Planned Discharge Destination: [facility] ? ? ?Diet: ?Diet Order   ? ?       ?  DIET DYS 3 Room service appropriate? Yes; Fluid consistency: Thin  Diet effective now       ?  ? ?  ?  ? ?  ?  ? ? ?Antimicrobial agents: ?Anti-infectives (From admission, onward)  ? ? Start     Dose/Rate Route Frequency Ordered Stop  ? 11/21/21 1000  cefTRIAXone (ROCEPHIN) 2 g in sodium chloride 0.9 % 100 mL IVPB       ? 2 g ?200 mL/hr over 30 Minutes Intravenous Every 24 hours 11/20/21 1128 11/22/21 0813  ? 11/18/21 1000  cefTRIAXone (ROCEPHIN) 2 g in sodium chloride 0.9 % 100 mL IVPB       ? 2 g ?200 mL/hr over 30 Minutes Intravenous  Every 24 hours 11/18/21 0906 11/20/21 1357  ? 11/16/21 2200  ceFEPIme (MAXIPIME) 2 g in sodium chloride 0.9 % 100 mL IVPB  Status:  Discontinued       ? 2 g ?200 mL/hr over 30 Minutes Intravenous Every 24 hours 11/16/21 0627 11/18/21 0906  ? 11/16/21 1000  vancomycin (VANCOREADY) IVPB 1250 mg/250 mL  Status:  Discontinued       ? 1,250 mg ?166.7 mL/hr over 90 Minutes Intravenous Every 48 hours 11/16/21 0627 11/17/21 0844  ? 11/16/21 0015  ceFEPIme (MAXIPIME) 2 g in sodium chloride 0.9 % 100 mL IVPB       ? 2 g ?200 mL/hr over 30 Minutes Intravenous  Once 11/16/21 0001 11/16/21 0054  ? 11/16/21 0015  metroNIDAZOLE (FLAGYL) IVPB 500 mg       ? 500 mg ?100 mL/hr over 60 Minutes Intravenous  Once 11/16/21 0001 11/16/21 0147  ? 11/16/21 0015  vancomycin (VANCOCIN) IVPB 1000 mg/200 mL premix       ? 1,000 mg ?200 mL/hr over 60 Minutes Intravenous  Once 11/16/21 0001 11/16/21 0246  ? ?  ? ? ? ?MEDICATIONS: ?  Scheduled Meds: ? arformoterol  15 mcg Nebulization BID  ? aspirin  81 mg Per Tube Daily  ? budesonide (PULMICORT) nebulizer solution  0.5 mg Nebulization BID  ? chlorhexidine gluconate (MEDLINE KIT)  15 mL Mouth Rinse BID  ? Chlorhexidine Gluconate Cloth  6 each Topical Daily  ? docusate  100 mg Per Tube BID  ? enoxaparin (LOVENOX) injection  40 mg Subcutaneous Q24H  ? escitalopram  20 mg Per Tube Daily  ? furosemide  40 mg Intravenous Q12H  ? guaiFENesin  600 mg Oral BID  ? ipratropium-albuterol  3 mL Nebulization BID  ? mouth rinse  15 mL Mouth Rinse BID  ? midodrine  10 mg Oral TID WC  ? polyethylene glycol  17 g Per Tube Daily  ? potassium chloride  20 mEq Oral Daily  ? pramipexole  1 mg Oral BID  ? sodium chloride flush  10-40 mL Intracatheter Q12H  ? ?Continuous Infusions: ? sodium chloride Stopped (11/18/21 0436)  ? ?PRN Meds:.albuterol, ALPRAZolam, sodium chloride flush ? ? ?I have personally reviewed following labs and imaging studies ? ?LABORATORY DATA: ?CBC: ?Recent Labs  ?Lab 11/15/21 ?2325 11/16/21 ?1136  11/18/21 ?0045 11/19/21 ?0143 11/20/21 ?0157 11/21/21 ?2585 11/22/21 ?0112  ?WBC 17.2*   < > 16.2* 16.7* 18.6* 16.3* 13.4*  ?NEUTROABS 15.8*  --   --   --   --   --   --   ?HGB 13.3   < > 11.2* 10.9* 10.9* 10.6* 10.8*  ?

## 2021-11-22 NOTE — Progress Notes (Signed)
?  X-cover Note: ?Bedside RN reports pt is chest pain free after SL NTG. Pt currently on midodrine for low BP. Hold off on long acting nitrates. Consider adding ranexa if pt continues with exertional chest pain and still has hypotension. ? ? ?Kristopher Oppenheim, DO ?[facility] ? ?

## 2021-11-23 DIAGNOSIS — N179 Acute kidney failure, unspecified: Secondary | ICD-10-CM | POA: Diagnosis not present

## 2021-11-23 DIAGNOSIS — G9340 Encephalopathy, unspecified: Secondary | ICD-10-CM | POA: Diagnosis not present

## 2021-11-23 DIAGNOSIS — J9621 Acute and chronic respiratory failure with hypoxia: Secondary | ICD-10-CM | POA: Diagnosis not present

## 2021-11-23 DIAGNOSIS — A419 Sepsis, unspecified organism: Secondary | ICD-10-CM | POA: Diagnosis not present

## 2021-11-23 LAB — GLUCOSE, CAPILLARY
Glucose-Capillary: 117 mg/dL — ABNORMAL HIGH (ref 70–99)
Glucose-Capillary: 100 mg/dL — ABNORMAL HIGH (ref 70–99)
Glucose-Capillary: 104 mg/dL — ABNORMAL HIGH (ref 70–99)
Glucose-Capillary: 182 mg/dL — ABNORMAL HIGH (ref 70–99)
Glucose-Capillary: 155 mg/dL — ABNORMAL HIGH (ref 70–99)

## 2021-11-23 LAB — COMPREHENSIVE METABOLIC PANEL
ALT: 104 U/L — ABNORMAL HIGH (ref 0–44)
AST: 16 U/L (ref 15–41)
Albumin: 1.8 g/dL — ABNORMAL LOW (ref 3.5–5.0)
Alkaline Phosphatase: 45 U/L (ref 38–126)
Anion gap: 6 (ref 5–15)
BUN: 11 mg/dL (ref 8–23)
CO2: 40 mmol/L — ABNORMAL HIGH (ref 22–32)
Calcium: 8.3 mg/dL — ABNORMAL LOW (ref 8.9–10.3)
Chloride: 96 mmol/L — ABNORMAL LOW (ref 98–111)
Creatinine, Ser: 0.85 mg/dL (ref 0.44–1.00)
GFR, Estimated: >60 mL/min (ref >60)
Glucose, Bld: 95 mg/dL (ref 70–99)
Potassium: 3.7 mmol/L (ref 3.5–5.1)
Sodium: 142 mmol/L (ref 135–145)
Total Bilirubin: 0.9 mg/dL (ref 0.3–1.2)
Total Protein: 4.8 g/dL — ABNORMAL LOW (ref 6.5–8.1)

## 2021-11-23 LAB — MAGNESIUM: Magnesium: 1.5 mg/dL — ABNORMAL LOW (ref 1.7–2.4)

## 2021-11-23 MED ORDER — MAGNESIUM SULFATE 2 GM/50ML IV SOLN
2.0000 g | Freq: Once | INTRAVENOUS | Status: AC
  Administered 2021-11-23: 2 g via INTRAVENOUS
  Filled 2021-11-23: qty 50

## 2021-11-23 NOTE — [place] FL2 (Signed)
?[place] MEDICAID FL2 LEVEL OF CARE SCREENING TOOL  ?  ? ?IDENTIFICATION  ?Patient Name: ?[name] Birthdate: 09-06-1955 Sex: female Admission Date (Current Location): ?11/15/2021  ?[place] and [place] Number: ? [place] ?  Facility and Address:  ?[facility] [place] [address], [place], [place] [address] ?     Provider Number: ?[id number]  ?Attending Physician Name and Address:  ?Jonetta Osgood, MD ? Relative Name and Phone Number:  ?Benge,Brandon Relative   [phone] ?   ?Current Level of Care: ?Hospital Recommended Level of Care: ?[place] Prior Approval Number: ?  ? ?Date Approved/Denied: ?  PASRR Number: ?[id number] A ? ?Discharge Plan: ?SNF ?  ? ?Current Diagnoses: ?Patient Active Problem List  ? Diagnosis Date Noted  ? Septic shock ([place]) 11/16/2021  ? Pressure injury of skin 11/16/2021  ? Depression 04/08/2016  ? Morbid obesity ([place]) 12/20/2015  ? Type 2 diabetes mellitus ([place]) 04/18/2015  ? Restless leg syndrome 03/12/2015  ? Vitamin D deficiency 03/12/2015  ? Generalized anxiety disorder 02/14/2014  ? Malignant neoplasm of upper-inner quadrant of female breast ([place]) 07/30/2011  ? Myocardial infarction [facility])   ? Hypertension   ? Hyperlipidemia   ? CAD (coronary artery disease)   ? COPD (chronic obstructive pulmonary disease) ([place])   ? Carotid stenosis   ? ? ?Orientation RESPIRATION BLADDER Height & Weight   ?  ?Self, Time, Situation, Place ? O2 Incontinent Weight: 239 lb 6.7 oz (108.6 kg) ?Height:  5' 5" (165.1 cm)  ?BEHAVIORAL SYMPTOMS/MOOD NEUROLOGICAL BOWEL NUTRITION STATUS  ?    Continent Diet (Please see DC Summary)  ?AMBULATORY STATUS COMMUNICATION OF NEEDS Skin   ?Limited Assist Verbally PU Stage and Appropriate Care, Other (Comment) (Buttocks left medial stage 2 partial thickness loss of dermis presenting as a shallow open injury w/a red pink wound w/out slough, red open area) ?  ?  ?  ?    ?     ?     ? ? ?Personal Care Assistance Level of  Assistance  ?Bathing, Feeding, Dressing Bathing Assistance: Limited assistance ?Feeding assistance: Limited assistance ?Dressing Assistance: Limited assistance ?   ? ?Functional Limitations Info  ?Hearing, Sight, Speech Sight Info: Adequate ?Hearing Info: Adequate ?Speech Info: Adequate  ? ? ?SPECIAL CARE FACTORS FREQUENCY  ?PT (By licensed PT), OT (By licensed OT)   ?  ?PT Frequency: 5x/week ?OT Frequency: 5x/week ?  ?  ?  ?   ? ? ?Contractures Contractures Info: Not present  ? ? ?Additional Factors Info  ?Code Status, Allergies Code Status Info: FULL ?Allergies Info: Food, Nutmeg Oil (Myristica Oil), Other, Amlodipine ?  ?  ?  ?   ? ?Current Medications (11/23/2021):  This is the current hospital active medication list ?Current Facility-Administered Medications  ?Medication Dose Route Frequency Provider Last Rate Last Admin  ? 0.9 %  sodium chloride infusion  250 mL Intravenous Continuous Frederik Pear, MD   Stopped at 11/18/21 0436  ? albuterol (PROVENTIL) (2.5 MG/3ML) 0.083% nebulizer solution 2.5 mg  2.5 mg Nebulization Q4H PRN Jacky Kindle, MD      ? ALPRAZolam Duanne Moron) tablet 0.25 mg  0.25 mg Oral TID PRN Jonetta Osgood, MD   0.25 mg at 11/22/21 2151  ? arformoterol (BROVANA) nebulizer solution 15 mcg  15 mcg Nebulization BID Jennelle Human B, NP   15 mcg at 11/22/21 2042  ? aspirin chewable tablet 81 mg  81 mg Oral Daily Ghimire, Henreitta Leber, MD  81 mg at 11/23/21 0902  ? budesonide (PULMICORT) nebulizer solution 0.5 mg  0.5 mg Nebulization BID Jennelle Human B, NP   0.5 mg at 11/22/21 2042  ? chlorhexidine gluconate (MEDLINE KIT) (PERIDEX) 0.12 % solution 15 mL  15 mL Mouth Rinse BID Icard, Bradley L, DO   15 mL at 11/23/21 0903  ? docusate (COLACE) 50 MG/5ML liquid 100 mg  100 mg Oral BID Jonetta Osgood, MD      ? enoxaparin (LOVENOX) injection 40 mg  40 mg Subcutaneous Q24H Jonetta Osgood, MD   40 mg at 11/22/21 2146  ? escitalopram (LEXAPRO) tablet 20 mg  20 mg Oral Daily Jonetta Osgood, MD   20 mg at 11/23/21 0902  ? furosemide (LASIX) injection 40 mg  40 mg Intravenous Q12H Jonetta Osgood, MD   40 mg at 11/23/21 5945  ? Gerhardt's butt cream   Topical BID Jonetta Osgood, MD   Given at 11/23/21 [phone]  ? guaiFENesin (MUCINEX) 12 hr tablet 600 mg  600 mg Oral BID Jennelle Human B, NP   600 mg at 11/23/21 9244  ? ipratropium-albuterol (DUONEB) 0.5-2.5 (3) MG/3ML nebulizer solution 3 mL  3 mL Nebulization BID Jonetta Osgood, MD   3 mL at 11/22/21 2042  ? MEDLINE mouth rinse  15 mL Mouth Rinse BID Jacky Kindle, MD   15 mL at 11/23/21 0904  ? nitroGLYCERIN (NITROSTAT) SL tablet 0.4 mg  0.4 mg Sublingual Q5 min PRN Kristopher Oppenheim, DO   0.4 mg at 11/22/21 1917  ? polyethylene glycol (MIRALAX / GLYCOLAX) packet 17 g  17 g Oral Daily Ghimire, Shanker M, MD      ? potassium chloride SA (KLOR-CON M) CR tablet 20 mEq  20 mEq Oral Daily Jonetta Osgood, MD   20 mEq at 11/23/21 0902  ? pramipexole (MIRAPEX) tablet 1 mg  1 mg Oral BID Jonetta Osgood, MD   1 mg at 11/23/21 6286  ? sodium chloride (OCEAN) 0.65 % nasal spray 1 spray  1 spray Each Nare PRN Jonetta Osgood, MD   1 spray at 11/22/21 1452  ? sodium chloride flush (NS) 0.9 % injection 10-40 mL  10-40 mL Intracatheter Q12H Icard, Bradley L, DO   10 mL at 11/23/21 0904  ? sodium chloride flush (NS) 0.9 % injection 10-40 mL  10-40 mL Intracatheter PRN Icard, Bradley L, DO      ? ? ? ?Discharge Medications: ?Please see discharge summary for a list of discharge medications. ? ?Relevant Imaging Results: ? ?Relevant Lab Results: ? ? ?Additional Information ?SSN#: [id number] Bipap.  Pt is not vaccinated for covid. ? ?Joanne Chars, [name] ? ? ? ? ?

## 2021-11-23 NOTE — Progress Notes (Signed)
?      ?                 PROGRESS NOTE ? ?      ?PATIENT DETAILS ?Name: [name] ?Age: 66 y.o. ?Sex: female ?Date of Birth: [birth year] ?Admit Date: 11/15/2021 ?Admitting Physician Lanier Clam, MD ?JIR:[id number], [name], FNP ? ?Brief Summary: ?Patient is a 66 y.o.  female with history of CAD s/p PCI, HTN, HLD COPD, CKD stage IIIa-who presented to the hospital with altered mental status-felt to have acute metabolic encephalopathy-septic shock due to PNA.  Intubated and admitted by PCCM-upon stability-transfer to [facility]. ? ?Significant events: ?4/22>> presented to [facility] ED with encephalopathy, shock-found to have PNA-intubated and transferred to [facility]. ?4/24>> extubated ?4/26>> transfer to [facility]. ? ?Significant studies: ?4/22>> CT head: No acute intracranial abnormality. ?4/23>> CXR: Increased opacity in left lower lobe. ?4/23>> CT chest/abdomen/pelvis: Extensive left-sided PNA, thyroid nodules. ?4/23>> CT head: No acute intracranial abnormality. ? ?Significant microbiology data: ?4/22>> influenza/COVID PCR: Negative ?4/22>> blood culture: Negative ?4/23>> blood culture: Negative ?4/23>> urine culture: E. 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 ? ?Procedures: ?4/22-4/24>> ETT ? ?Consults: ?None  ? ?Subjective: ?Refused CPAP last night-but awake and alert this morning.  Was titrated down to 4 L of oxygen. ? ?Objective: ?Vitals: ?Blood pressure 134/79, pulse 95, temperature 98.5 ?F (36.9 ?C), temperature source Oral, resp. rate (!) 23, height 5' 5"  (1.651 m), weight 108.6 kg, last menstrual period 10/17/2005, SpO2 93 %.  ? ?Exam: ?Gen Exam:Alert awake-not in any distress ?HEENT:atraumatic, normocephalic ?Chest: B/L clear to auscultation anteriorly ?CVS:S1S2 regular ?Abdomen:soft non tender, non distended ?Extremities:+ edema ?Neurology: Non focal ?Skin: no rash  ? ?Pertinent Labs/Radiology: ? ?  Latest Ref Rng & Units 11/22/2021  ?  1:12 AM  11/21/2021  ?  2:47 AM 11/20/2021  ?  1:57 AM  ?CBC  ?WBC 4.0 - 10.5 K/uL 13.4   16.3   18.6    ?Hemoglobin 12.0 - 15.0 g/dL 10.8   10.6   10.9    ?Hematocrit 36.0 - 46.0 % 37.5   37.7   39.8    ?Platelets 150 - 400 K/uL 127   108   107    ?  ?Lab Results  ?Component Value Date  ? NA 142 11/23/2021  ? K 3.7 11/23/2021  ? CL 96 (L) 11/23/2021  ? CO2 40 (H) 11/23/2021  ? ?  ? ? ?Assessment/Plan: ?Septic shock: Due to PNA-sepsis physiology has improved-has completed a course of 7 days of antimicrobial therapy on 4/28.  Remains afebrile-and stable off antimicrobial therapy. ? ?Acute metabolic encephalopathy: Multifactorial in the setting of sepsis-with some contributions from benzos, mild hypercarbia.  Encephalopathy has resolved-CT head x2 negative for acute abnormality.  ? ?Acute hypoxic respiratory failure: Likely due to PNA-and HFpEF exacerbation.  Has completed a course of antimicrobial therapy-plan is to continue IV Lasix and attempt to titrate off oxygen further.  Stable on 4 L of oxygen this morning.   ? ?Acute on chronic HFpEF: Remains volume overloaded-continue IV Lasix-renal function stable-continue to titrate down FiO2.  ? ?AKI on CKD stage IIIa: AKI hemodynamically mediated-creatinine has now normalized. ? ?Shock liver: Again improvement in transaminases-follow periodically. ? ?CAD s/p PCI to RCA in 2012: No anginal symptoms-continue ASA.  Reviewed last cardiology-last LHC 10/07/2020-showed stable CAD-recommendations were for medical management.  Resume statin/beta-blocker once LFTs and BP permit. ? ?HTN: BP stable-midodrine has been discontinued-watch closely and resume antihypertensives when able. ? ?Anxiety/depression:  Appears relatively stable-since developed lethargy with benzodiazepines-Xanax dose lowered and changed to as needed.  Remains on Lexapro.   ? ?Restless leg syndrome: On Mirapex. ? ?History of breast cancer: Stable for outpatient follow-up with PCP. ? ?COPD: Not in exacerbation-continue  bronchodilators. ? ?Probable OSA: Given hypercarbia-PCCM recommending BiPAP nightly. ? ?Bilateral thyroid nodule: Seen incidentally on CT chest-stable for outpatient follow-up with PCP.  TSH stable. ? ?Pressure Ulcer: ?Pressure Injury 11/16/21 Buttocks Left;Medial Stage 2 -  Partial thickness loss of dermis presenting as a shallow open injury with a red, pink wound bed without slough. Red open area (Active)  ?11/16/21 1030  ?Location: Buttocks  ?Location Orientation: Left;Medial  ?Staging: Stage 2 -  Partial thickness loss of dermis presenting as a shallow open injury with a red, pink wound bed without slough.  ?Wound Description (Comments): Red open area  ?Present on Admission: Yes  ?Dressing Type Foam - Lift dressing to assess site every shift 11/23/21 0900  ? ?Obesity: ?Estimated body mass index is 39.84 kg/m? as calculated from the following: ?  Height as of this encounter: 5' 5"  (1.651 m). ?  Weight as of this encounter: 108.6 kg.  ? ?Code status: ?  Code Status: Prior  ? ?DVT Prophylaxis: ?enoxaparin (LOVENOX) injection 40 mg Start: 11/20/21 2200 ?SCDs Start: 11/16/21 1312 ?  ?Family Communication: Son in law-Brandon [phone] 4/29 ? ? ?Disposition Plan: ?Status is: Inpatient ?Remains inpatient appropriate because: Resolving hypoxia-still on 4-6 L of oxygen-not yet stable for discharge.  . ?  ?Planned Discharge Destination: SNF ? ? ?Diet: ?Diet Order   ? ?       ?  DIET DYS 3 Room service appropriate? Yes; Fluid consistency: Thin  Diet effective now       ?  ? ?  ?  ? ?  ?  ? ? ?Antimicrobial agents: ?Anti-infectives (From admission, onward)  ? ? Start     Dose/Rate Route Frequency Ordered Stop  ? 11/21/21 1000  cefTRIAXone (ROCEPHIN) 2 g in sodium chloride 0.9 % 100 mL IVPB       ? 2 g ?200 mL/hr over 30 Minutes Intravenous Every 24 hours 11/20/21 1128 11/22/21 0813  ? 11/18/21 1000  cefTRIAXone (ROCEPHIN) 2 g in sodium chloride 0.9 % 100 mL IVPB       ? 2 g ?200 mL/hr over 30 Minutes Intravenous  Every 24 hours 11/18/21 0906 11/20/21 1357  ? 11/16/21 2200  ceFEPIme (MAXIPIME) 2 g in sodium chloride 0.9 % 100 mL IVPB  Status:  Discontinued       ? 2 g ?200 mL/hr over 30 Minutes Intravenous Every 24 hours 11/16/21 0627 11/18/21 0906  ? 11/16/21 1000  vancomycin (VANCOREADY) IVPB 1250 mg/250 mL  Status:  Discontinued       ? 1,250 mg ?166.7 mL/hr over 90 Minutes Intravenous Every 48 hours 11/16/21 0627 11/17/21 0844  ? 11/16/21 0015  ceFEPIme (MAXIPIME) 2 g in sodium chloride 0.9 % 100 mL IVPB       ? 2 g ?200 mL/hr over 30 Minutes Intravenous  Once 11/16/21 0001 11/16/21 0054  ? 11/16/21 0015  metroNIDAZOLE (FLAGYL) IVPB 500 mg       ? 500 mg ?100 mL/hr over 60 Minutes Intravenous  Once 11/16/21 0001 11/16/21 0147  ? 11/16/21 0015  vancomycin (VANCOCIN) IVPB 1000 mg/200 mL premix       ? 1,000 mg ?200 mL/hr over 60 Minutes Intravenous  Once 11/16/21 0001 11/16/21 0246  ? ?  ? ? ? ?  MEDICATIONS: ?Scheduled Meds: ? arformoterol  15 mcg Nebulization BID  ? aspirin  81 mg Oral Daily  ? budesonide (PULMICORT) nebulizer solution  0.5 mg Nebulization BID  ? chlorhexidine gluconate (MEDLINE KIT)  15 mL Mouth Rinse BID  ? docusate  100 mg Oral BID  ? enoxaparin (LOVENOX) injection  40 mg Subcutaneous Q24H  ? escitalopram  20 mg Oral Daily  ? furosemide  40 mg Intravenous Q12H  ? Gerhardt's butt cream   Topical BID  ? guaiFENesin  600 mg Oral BID  ? ipratropium-albuterol  3 mL Nebulization BID  ? mouth rinse  15 mL Mouth Rinse BID  ? polyethylene glycol  17 g Oral Daily  ? potassium chloride  20 mEq Oral Daily  ? pramipexole  1 mg Oral BID  ? sodium chloride flush  10-40 mL Intracatheter Q12H  ? ?Continuous Infusions: ? sodium chloride Stopped (11/18/21 0436)  ? ?PRN Meds:.albuterol, ALPRAZolam, nitroGLYCERIN, sodium chloride, sodium chloride flush ? ? ?I have personally reviewed following labs and imaging studies ? ?LABORATORY DATA: ?CBC: ?Recent Labs  ?Lab 11/18/21 ?0045 11/19/21 ?0143 11/20/21 ?0157 11/21/21 ?9872  11/22/21 ?0112  ?WBC 16.2* 16.7* 18.6* 16.3* 13.4*  ?HGB 11.2* 10.9* 10.9* 10.6* 10.8*  ?HCT 38.3 39.9 39.8 37.7 37.5  ?MCV 83.1 86.0 86.5 86.3 84.8  ?PLT 111* 98* 107* 108* 127*  ? ? ? ?Basic Metabolic Panel: ?Rece

## 2021-11-23 NOTE — TOC Progression Note (Signed)
Transition of Care (TOC) - Progression Note  ? ? ?Patient Details  ?Name: KOURTNEI RAUBER ?MRN: [id number] ?Date of Birth: [birth year] ? ?Transition of Care (TOC) CM/SW Contact  ?Joanne Chars, [name] ?Phone Number: ?11/23/2021, 10:53 AM ? ?Clinical Narrative:   DC recommendation is now SNF, per Laura/CIR, auth unlikely with them due to dx.  CSW spoke with pt regarding SNF and she is agreeable to this, choice document given.  Pt lives in [place] but daughter is in [place] and she asked that referral be sent out to [facility] facilities as well.  Pt is not vaccinated for covid. ? ?Referral sent out in hub for SNF. ? ? ? ?  ?Barriers to Discharge: Continued Medical Work up, Ship broker, SNF Pending bed offer ? ?Expected Discharge Plan and Services ?  ?In-house Referral: Clinical Social Work ?  ?Post Acute Care Choice: [facility], [facility] ?Living arrangements for the past 2 months: [place] ?                ?  ?  ?  ?  ?  ?  ?  ?  ?  ?  ? ? ?Social Determinants of Health (SDOH) Interventions ?  ? ?Readmission Risk Interventions ?   ? View : No data to display.  ?  ?  ?  ? ? ?

## 2021-11-23 NOTE — Progress Notes (Signed)
Patient states she does not want to wear BIPAP tonight. O2 is down to 4 lpm. Unit at bedside if needed. ?

## 2021-11-24 ENCOUNTER — Telehealth: Payer: Medicare HMO

## 2021-11-24 DIAGNOSIS — N179 Acute kidney failure, unspecified: Secondary | ICD-10-CM | POA: Diagnosis not present

## 2021-11-24 DIAGNOSIS — G9340 Encephalopathy, unspecified: Secondary | ICD-10-CM | POA: Diagnosis not present

## 2021-11-24 DIAGNOSIS — J9621 Acute and chronic respiratory failure with hypoxia: Secondary | ICD-10-CM | POA: Diagnosis not present

## 2021-11-24 DIAGNOSIS — A419 Sepsis, unspecified organism: Secondary | ICD-10-CM | POA: Diagnosis not present

## 2021-11-24 LAB — GLUCOSE, CAPILLARY
Glucose-Capillary: 111 mg/dL — ABNORMAL HIGH (ref 70–99)
Glucose-Capillary: 122 mg/dL — ABNORMAL HIGH (ref 70–99)
Glucose-Capillary: 125 mg/dL — ABNORMAL HIGH (ref 70–99)
Glucose-Capillary: 134 mg/dL — ABNORMAL HIGH (ref 70–99)
Glucose-Capillary: 135 mg/dL — ABNORMAL HIGH (ref 70–99)
Glucose-Capillary: 150 mg/dL — ABNORMAL HIGH (ref 70–99)

## 2021-11-24 LAB — BASIC METABOLIC PANEL
Anion gap: 9 (ref 5–15)
BUN: 11 mg/dL (ref 8–23)
CO2: 43 mmol/L — ABNORMAL HIGH (ref 22–32)
Calcium: 8.3 mg/dL — ABNORMAL LOW (ref 8.9–10.3)
Chloride: 90 mmol/L — ABNORMAL LOW (ref 98–111)
Creatinine, Ser: 0.92 mg/dL (ref 0.44–1.00)
GFR, Estimated: >60 mL/min (ref >60)
Glucose, Bld: 110 mg/dL — ABNORMAL HIGH (ref 70–99)
Potassium: 3.5 mmol/L (ref 3.5–5.1)
Sodium: 142 mmol/L (ref 135–145)

## 2021-11-24 LAB — MAGNESIUM: Magnesium: 1.7 mg/dL (ref 1.7–2.4)

## 2021-11-24 MED ORDER — POTASSIUM CHLORIDE CRYS ER 20 MEQ PO TBCR
40.0000 meq | EXTENDED_RELEASE_TABLET | Freq: Every day | ORAL | Status: DC
  Administered 2021-11-24: 40 meq via ORAL
  Filled 2021-11-24: qty 2

## 2021-11-24 MED ORDER — ACETAZOLAMIDE 250 MG PO TABS
250.0000 mg | ORAL_TABLET | Freq: Two times a day (BID) | ORAL | Status: AC
  Administered 2021-11-24: 250 mg via ORAL
  Filled 2021-11-24 (×2): qty 1

## 2021-11-24 MED ORDER — UMECLIDINIUM BROMIDE 62.5 MCG/ACT IN AEPB
1.0000 | INHALATION_SPRAY | Freq: Every day | RESPIRATORY_TRACT | Status: DC
  Administered 2021-11-25 – 2021-11-26 (×2): 1 via RESPIRATORY_TRACT
  Filled 2021-11-24: qty 7

## 2021-11-24 NOTE — Progress Notes (Signed)
PROGRESS NOTE        PATIENT DETAILS Name: [name] Age: 66 y.o. Sex: female Date of Birth: 1955-10-02 Admit Date: 11/15/2021 Admitting Physician Karren Burly, MD ZOX:WRUEAV, [name], FNP  Brief Summary: Patient is a 66 y.o.  female with history of CAD s/p PCI, HTN, HLD COPD, CKD stage IIIa-who presented to the hospital with altered mental status-felt to have acute metabolic encephalopathy-septic shock due to PNA.  Intubated and admitted by PCCM-upon stability-transfer to [facility].  Significant events: 4/22>> presented to [facility] ED with encephalopathy, shock-found to have PNA-intubated and transferred to [facility]. 4/24>> extubated 4/26>> transfer to [facility].  Significant studies: 4/22>> CT head: No acute intracranial abnormality. 4/23>> CXR: Increased opacity in left lower lobe. 4/23>> CT chest/abdomen/pelvis: Extensive left-sided PNA, thyroid nodules. 4/23>> CT head: No acute intracranial abnormality.  Significant microbiology data: 4/22>> influenza/COVID PCR: Negative 4/22>> blood culture: Negative 4/23>> blood culture: Negative 4/23>> urine culture: E. coli, VRE (likely contamination/colonization) 4/23>> tracheal aspirate culture: Normal respiratory flora.  Procedures: 4/22-4/24>> ETT  Consults: None   Subjective: Was stable on 4 L of oxygen yesterday-got placed on 8 L of oxygen overnight-titrated back to 4 L this morning.  Refused BiPAP last night.  Is more awake and alert compared to the past few days-and asking for more food.  Objective: Vitals: Blood pressure 140/80, pulse 84, temperature 98 F (36.7 C), temperature source Oral, resp. rate (!) 22, height 5\' 5"  (1.651 m), weight 108 kg, last menstrual period 10/17/2005, SpO2 95 %.   Exam: Gen Exam:Alert awake-not in any distress HEENT:atraumatic, normocephalic Chest: B/L clear to auscultation anteriorly CVS:S1S2 regular Abdomen:soft non tender, non distended Extremities:+  edema Neurology: Non focal Skin: no rash   Pertinent Labs/Radiology:    Latest Ref Rng & Units 11/22/2021    1:12 AM 11/21/2021    2:47 AM 11/20/2021    1:57 AM  CBC  WBC 4.0 - 10.5 K/uL 13.4   16.3   18.6    Hemoglobin 12.0 - 15.0 g/dL 40.9   81.1   91.4    Hematocrit 36.0 - 46.0 % 37.5   37.7   39.8    Platelets 150 - 400 K/uL 127   108   107      Lab Results  Component Value Date   NA 142 11/24/2021   K 3.5 11/24/2021   CL 90 (L) 11/24/2021   CO2 43 (H) 11/24/2021       Assessment/Plan: Septic shock: Due to PNA-sepsis physiology has improved-has completed a course of 7 days of antimicrobial therapy on 4/28.  Remains afebrile-and stable off antimicrobial therapy.  Acute metabolic encephalopathy: Multifactorial in the setting of sepsis-with some contributions from benzos, mild hypercarbia.  Encephalopathy has resolved-CT head x2 negative for acute abnormality.   Acute hypoxic respiratory failure: Likely due to PNA-and HFpEF exacerbation.  Has completed a course of antimicrobial therapy-continue IV Lasix-continue to titrate down oxygen further.  Repeat CXR tomorrow morning.  Suspect may require home O2 on discharge.   Acute on chronic HFpEF: Volume status better-still with some lower extremity edema-continue IV Lasix-continue to attempt to titrate down FiO2.    AKI on CKD stage IIIa: AKI hemodynamically mediated-creatinine has now normalized.  Shock liver: Again improvement in transaminases-follow periodically.  CAD s/p PCI to RCA in 2012: No anginal symptoms-continue ASA.  Reviewed last cardiology-last [facility] 10/07/2020-showed stable  CAD-recommendations were for medical management.  Resume statin/beta-blocker once LFTs and BP permit.  HTN: BP stable-midodrine has been discontinued-watch closely and resume antihypertensives when able.  Anxiety/depression: Appears relatively stable-since developed lethargy with benzodiazepines-Xanax dose lowered and changed to as needed.  Remains  on Lexapro.    Restless leg syndrome: On Mirapex.  History of breast cancer: Stable for outpatient follow-up with PCP.  COPD: Not in exacerbation-continue bronchodilators.  Probable OSA: Given hypercarbia-PCCM recommending BiPAP nightly.  Bilateral thyroid nodule: Seen incidentally on CT chest-stable for outpatient follow-up with PCP.  TSH stable.  Pressure Ulcer: Pressure Injury 11/16/21 Buttocks Left;Medial Stage 2 -  Partial thickness loss of dermis presenting as a shallow open injury with a red, pink wound bed without slough. Red open area (Active)  11/16/21 1030  Location: Buttocks  Location Orientation: Left;Medial  Staging: Stage 2 -  Partial thickness loss of dermis presenting as a shallow open injury with a red, pink wound bed without slough.  Wound Description (Comments): Red open area  Present on Admission: Yes  Dressing Type Foam - Lift dressing to assess site every shift 11/24/21 0900   Obesity: Estimated body mass index is 39.62 kg/m as calculated from the following:   Height as of this encounter: 5\' 5"  (1.651 m).   Weight as of this encounter: 108 kg.   Code status:   Code Status: Prior   DVT Prophylaxis: enoxaparin (LOVENOX) injection 40 mg Start: 11/20/21 2200 SCDs Start: 11/16/21 1312   Family Communication: Son in law-Brandon [phone]-on 4/29   Disposition Plan: Status is: Inpatient Remains inpatient appropriate because: Resolving hypoxia-still on 4-6 L of oxygen-not yet stable for discharge.  .   Planned Discharge Destination: SNF   Diet: Diet Order             DIET DYS 3 Room service appropriate? Yes; Fluid consistency: Thin  Diet effective now                     Antimicrobial agents: Anti-infectives (From admission, onward)    Start     Dose/Rate Route Frequency Ordered Stop   11/21/21 1000  cefTRIAXone (ROCEPHIN) 2 g in sodium chloride 0.9 % 100 mL IVPB        2 g 200 mL/hr over 30 Minutes Intravenous Every 24 hours  11/20/21 1128 11/22/21 0813   11/18/21 1000  cefTRIAXone (ROCEPHIN) 2 g in sodium chloride 0.9 % 100 mL IVPB        2 g 200 mL/hr over 30 Minutes Intravenous Every 24 hours 11/18/21 0906 11/20/21 1357   11/16/21 2200  ceFEPIme (MAXIPIME) 2 g in sodium chloride 0.9 % 100 mL IVPB  Status:  Discontinued        2 g 200 mL/hr over 30 Minutes Intravenous Every 24 hours 11/16/21 0627 11/18/21 0906   11/16/21 1000  vancomycin (VANCOREADY) IVPB 1250 mg/250 mL  Status:  Discontinued        1,250 mg 166.7 mL/hr over 90 Minutes Intravenous Every 48 hours 11/16/21 0627 11/17/21 0844   11/16/21 0015  ceFEPIme (MAXIPIME) 2 g in sodium chloride 0.9 % 100 mL IVPB        2 g 200 mL/hr over 30 Minutes Intravenous  Once 11/16/21 0001 11/16/21 0054   11/16/21 0015  metroNIDAZOLE (FLAGYL) IVPB 500 mg        500 mg 100 mL/hr over 60 Minutes Intravenous  Once 11/16/21 0001 11/16/21 0147   11/16/21 0015  vancomycin (VANCOCIN) IVPB 1000 mg/200  mL premix        1,000 mg 200 mL/hr over 60 Minutes Intravenous  Once 11/16/21 0001 11/16/21 0246        MEDICATIONS: Scheduled Meds:  arformoterol  15 mcg Nebulization BID   aspirin  81 mg Oral Daily   budesonide (PULMICORT) nebulizer solution  0.5 mg Nebulization BID   chlorhexidine gluconate (MEDLINE KIT)  15 mL Mouth Rinse BID   docusate  100 mg Oral BID   enoxaparin (LOVENOX) injection  40 mg Subcutaneous Q24H   escitalopram  20 mg Oral Daily   furosemide  40 mg Intravenous Q12H   Gerhardt's butt cream   Topical BID   guaiFENesin  600 mg Oral BID   ipratropium-albuterol  3 mL Nebulization BID   mouth rinse  15 mL Mouth Rinse BID   polyethylene glycol  17 g Oral Daily   potassium chloride  40 mEq Oral Daily   pramipexole  1 mg Oral BID   sodium chloride flush  10-40 mL Intracatheter Q12H   Continuous Infusions:  sodium chloride Stopped (11/18/21 0436)   PRN Meds:.albuterol, ALPRAZolam, nitroGLYCERIN, sodium chloride, sodium chloride flush   I have  personally reviewed following labs and imaging studies  LABORATORY DATA: CBC: Recent Labs  Lab 11/18/21 0045 11/19/21 0143 11/20/21 0157 11/21/21 0247 11/22/21 0112  WBC 16.2* 16.7* 18.6* 16.3* 13.4*  HGB 11.2* 10.9* 10.9* 10.6* 10.8*  HCT 38.3 39.9 39.8 37.7 37.5  MCV 83.1 86.0 86.5 86.3 84.8  PLT 111* 98* 107* 108* 127*     Basic Metabolic Panel: Recent Labs  Lab 11/18/21 0045 11/19/21 0143 11/20/21 0157 11/21/21 0247 11/22/21 0112 11/23/21 0456 11/24/21 0213  NA 145 144 145 143 144 142 142  K 4.2 4.2 5.0 4.1 4.0 3.7 3.5  CL 110 108 109 104 103 96* 90*  CO2 31 31 32 33* 35* 40* 43*  GLUCOSE 98 100* 113* 199* 128* 95 110*  BUN 61* 35* 21 14 11 11 11   CREATININE 1.62* 1.11* 1.04* 0.86 0.88 0.85 0.92  CALCIUM 8.1* 8.1* 8.2* 8.3* 8.7* 8.3* 8.3*  MG 1.8  --   --   --   --  1.5* 1.7  PHOS 4.4 3.1  --   --   --   --   --      GFR: Estimated Creatinine Clearance: 74.5 mL/min (by C-G formula based on SCr of 0.92 mg/dL).  Liver Function Tests: Recent Labs  Lab 11/19/21 0143 11/20/21 0157 11/21/21 0247 11/22/21 0112 11/23/21 0456  AST  --  39 19 16 16   ALT  --  317* 216* 160* 104*  ALKPHOS  --  56 67 58 45  BILITOT  --  0.8 0.5 0.6 0.9  PROT  --  5.0* 4.9* 5.1* 4.8*  ALBUMIN 1.9* 1.9* 1.9* 2.0* 1.8*    No results for input(s): LIPASE, AMYLASE in the last 168 hours. No results for input(s): AMMONIA in the last 168 hours.  Coagulation Profile: No results for input(s): INR, PROTIME in the last 168 hours.   Cardiac Enzymes: No results for input(s): CKTOTAL, CKMB, CKMBINDEX, TROPONINI in the last 168 hours.   BNP (last 3 results) Recent Labs    06/16/21 1045  PROBNP 74     Lipid Profile: No results for input(s): CHOL, HDL, LDLCALC, TRIG, CHOLHDL, LDLDIRECT in the last 72 hours.  Thyroid Function Tests: No results for input(s): TSH, T4TOTAL, FREET4, T3FREE, THYROIDAB in the last 72 hours.   Anemia Panel: No results  for input(s): VITAMINB12,  FOLATE, FERRITIN, TIBC, IRON, RETICCTPCT in the last 72 hours.  Urine analysis:    Component Value Date/Time   COLORURINE AMBER (A) 11/15/2021 2325   APPEARANCEUR CLOUDY (A) 11/15/2021 2325   LABSPEC 1.016 11/15/2021 2325   PHURINE 5.0 11/15/2021 2325   GLUCOSEU NEGATIVE 11/15/2021 2325   HGBUR MODERATE (A) 11/15/2021 2325   BILIRUBINUR NEGATIVE 11/15/2021 2325   KETONESUR NEGATIVE 11/15/2021 2325   PROTEINUR 30 (A) 11/15/2021 2325   UROBILINOGEN 0.2 05/14/2011 1145   NITRITE NEGATIVE 11/15/2021 2325   LEUKOCYTESUR SMALL (A) 11/15/2021 2325    Sepsis Labs: Lactic Acid, Venous    Component Value Date/Time   LATICACIDVEN 1.6 11/16/2021 1010    MICROBIOLOGY: Recent Results (from the past 240 hour(s))  Resp Panel by RT-PCR (Flu A&B, Covid) Nasopharyngeal Swab     Status: None   Collection Time: 11/15/21 11:25 PM   Specimen: Nasopharyngeal Swab; Nasopharyngeal(NP) swabs in vial transport medium  Result Value Ref Range Status   SARS Coronavirus 2 by RT PCR NEGATIVE NEGATIVE Final    Comment: (NOTE) SARS-CoV-2 target nucleic acids are NOT DETECTED.  The SARS-CoV-2 RNA is generally detectable in upper respiratory specimens during the acute phase of infection. The lowest concentration of SARS-CoV-2 viral copies this assay can detect is 138 copies/mL. A negative result does not preclude SARS-Cov-2 infection and should not be used as the sole basis for treatment or other patient management decisions. A negative result may occur with  improper specimen collection/handling, submission of specimen other than nasopharyngeal swab, presence of viral mutation(s) within the areas targeted by this assay, and inadequate number of viral copies(<138 copies/mL). A negative result must be combined with clinical observations, patient history, and epidemiological information. The expected result is Negative.  Fact Sheet for Patients:  BloggerCourse.com  Fact Sheet for  Healthcare Providers:  SeriousBroker.it  This test is no t yet approved or cleared by the [place] FDA and  has been authorized for detection and/or diagnosis of SARS-CoV-2 by FDA under an Emergency Use Authorization (EUA). This EUA will remain  in effect (meaning this test can be used) for the duration of the COVID-19 declaration under Section 564(b)(1) of the Act, 21 U.S.C.section 360bbb-3(b)(1), unless the authorization is terminated  or revoked sooner.       Influenza A by PCR NEGATIVE NEGATIVE Final   Influenza B by PCR NEGATIVE NEGATIVE Final    Comment: (NOTE) The Xpert Xpress SARS-CoV-2/FLU/RSV plus assay is intended as an aid in the diagnosis of influenza from Nasopharyngeal swab specimens and should not be used as a sole basis for treatment. Nasal washings and aspirates are unacceptable for Xpert Xpress SARS-CoV-2/FLU/RSV testing.  Fact Sheet for Patients: BloggerCourse.com  Fact Sheet for Healthcare Providers: SeriousBroker.it  This test is not yet approved or cleared by the [place] FDA and has been authorized for detection and/or diagnosis of SARS-CoV-2 by FDA under an Emergency Use Authorization (EUA). This EUA will remain in effect (meaning this test can be used) for the duration of the COVID-19 declaration under Section 564(b)(1) of the Act, 21 U.S.C. section 360bbb-3(b)(1), unless the authorization is terminated or revoked.  Performed at [facility], [address] [place], [place] [address]   Blood culture (routine x 2)     Status: None   Collection Time: 11/15/21 11:26 PM   Specimen: Right Antecubital; Blood  Result Value Ref Range Status   Specimen Description   Final    RIGHT ANTECUBITAL BOTTLES DRAWN AEROBIC  AND ANAEROBIC   Special Requests Blood Culture adequate volume  Final   Culture   Final    NO GROWTH 5 DAYS Performed at [facility], [address],  [place], [place] [address]    Report Status 11/20/2021 FINAL  Final  Urine Culture     Status: Abnormal   Collection Time: 11/16/21 12:00 AM   Specimen: In/Out Cath Urine  Result Value Ref Range Status   Specimen Description   Final    IN/OUT CATH URINE Performed at [facility], [address], [place], [place] [address]    Special Requests   Final    NONE Performed at [facility], [address], [place], [place] [address]    Culture (A)  Final    40,000 COLONIES/mL ESCHERICHIA COLI 20,000 COLONIES/mL ENTEROCOCCUS GALLINARUM VANCOMYCIN RESISTANT ENTEROCOCCUS    Report Status 11/18/2021 FINAL  Final   Organism ID, Bacteria ESCHERICHIA COLI (A)  Final   Organism ID, Bacteria ENTEROCOCCUS GALLINARUM (A)  Final      Susceptibility   Escherichia coli - MIC*    AMPICILLIN <=2 SENSITIVE Sensitive     CEFAZOLIN <=4 SENSITIVE Sensitive     CEFEPIME <=0.12 SENSITIVE Sensitive     CEFTRIAXONE <=0.25 SENSITIVE Sensitive     CIPROFLOXACIN <=0.25 SENSITIVE Sensitive     GENTAMICIN <=1 SENSITIVE Sensitive     IMIPENEM <=0.25 SENSITIVE Sensitive     NITROFURANTOIN <=16 SENSITIVE Sensitive     TRIMETH/SULFA <=20 SENSITIVE Sensitive     AMPICILLIN/SULBACTAM <=2 SENSITIVE Sensitive     PIP/TAZO <=4 SENSITIVE Sensitive     * 40,000 COLONIES/mL ESCHERICHIA COLI   Enterococcus gallinarum - MIC*    AMPICILLIN <=2 SENSITIVE Sensitive     NITROFURANTOIN <=16 SENSITIVE Sensitive     VANCOMYCIN >=32 RESISTANT Resistant     * 20,000 COLONIES/mL ENTEROCOCCUS GALLINARUM  Blood culture (routine x 2)     Status: None   Collection Time: 11/16/21  1:31 AM   Specimen: Site Not Specified; Blood  Result Value Ref Range Status   Specimen Description SITE NOT SPECIFIED BOTTLES DRAWN AEROBIC ONLY  Final   Special Requests   Final    Blood Culture results may not be optimal due to an inadequate volume of blood received in culture bottles   Culture   Final    NO GROWTH 5 DAYS Performed at [facility],  [address] [place], [place] [address]    Report Status 11/21/2021 FINAL  Final  MRSA Next Gen by PCR, Nasal     Status: None   Collection Time: 11/16/21 10:10 AM   Specimen: Nasal Mucosa; Nasal Swab  Result Value Ref Range Status   MRSA by PCR Next Gen NOT DETECTED NOT DETECTED Final    Comment: (NOTE) The GeneXpert MRSA Assay (FDA approved for NASAL specimens only), is one component of a comprehensive MRSA colonization surveillance program. It is not intended to diagnose MRSA infection nor to guide or monitor treatment for MRSA infections. Test performance is not FDA approved in patients less than 59 years old. Performed at [facility], [address], [place], [place] [address]   Culture, Respiratory w Gram Stain     Status: None   Collection Time: 11/16/21 10:27 AM   Specimen: Tracheal Aspirate  Result Value Ref Range Status   Specimen Description TRACHEAL ASPIRATE  Final   Special Requests NONE  Final   Gram Stain   Final    ABUNDANT WBC PRESENT, PREDOMINANTLY PMN MODERATE  GRAM VARIABLE ROD MODERATE GRAM POSITIVE COCCI IN PAIRS AND CHAINS    Culture   Final    RARE Normal respiratory flora-no Staph aureus or Pseudomonas seen Performed at [facility], [address] [place], [place] [address]    Report Status 11/18/2021 FINAL  Final    RADIOLOGY STUDIES/RESULTS: No results found.   LOS: 8 days   Jeoffrey Massed, MD  [facility]    To contact the attending provider between 7A-7P or the covering provider during after hours 7P-7A, please log into the web site [email or web address] and access using universal  password for that web site. If you do not have the password, please call the hospital operator.  11/24/2021, 12:01 PM

## 2021-11-24 NOTE — Progress Notes (Signed)
Occupational Therapy Treatment ?Patient Details ?Name: [name] ?MRN: [id number] ?DOB: 12-Jul-1956 ?Today's Date: 11/24/2021 ? ? ?History of present illness Pt is 66 yo female found AMS unresponsive 4/23 in feces with septic shock (+) UA intubated 4/23-4/24.  Pt with PNAand metabolic encephalopathy . PMH anxiety, breast CA, CAD, nstemi 2008, COPD depression, HLD, HTN MI, stable angina, stress incontinence, lumpectomy 2012, R elbow surg 2008 ?  ?OT comments ? Pt demonstrates brisk responses to questions this session and increased awareness to course of admission. Pt on 8L HFNC on arrival. Pt completed sit<>Stand 1 minute 8 successful attempts with DOE 3 out 4 requiring rebound time. Pt educated on pursed lip breathing. Pt motivated to do more activity and reports "I need to get this stuff up". Pt referring to cough without production of secretions at this time. Recommendation for CIR   ? ?Recommendations for follow up therapy are one component of a multi-disciplinary discharge planning process, led by the attending physician.  Recommendations may be updated based on patient status, additional functional criteria and insurance authorization. ?   ?Follow Up Recommendations ? Acute inpatient rehab (3hours/day)  ?  ?Assistance Recommended at Discharge Frequent or constant Supervision/Assistance  ?Patient can return home with the following ? A little help with walking and/or transfers;A little help with bathing/dressing/bathroom;Assistance with cooking/housework;Help with stairs or ramp for entrance;Assist for transportation ?  ?Equipment Recommendations ? Other (comment)  ?  ?Recommendations for Other Services Rehab consult ? ?  ?Precautions / Restrictions Precautions ?Precautions: Fall ?Precaution Comments: fall risk watch BP, HFNC 8L ?Restrictions ?Weight Bearing Restrictions: No  ? ? ?  ? ?Mobility Bed Mobility ?Overal bed mobility: Needs Assistance ?Bed Mobility: Supine to Sit ?Rolling: Min guard ?  ?Supine to sit:  Min guard, HOB elevated ?Sit to supine: Min guard ?  ?General bed mobility comments: pt requires increased time to complete task and depend on bed rails with HOB increased. pt will require increased (A) from flat bed surface. pt requires rest break due to DOE with task ?  ? ?Transfers ?Overall transfer level: Needs assistance ?Equipment used: Rolling walker (2 wheels) ?Transfers: Sit to/from Stand, Bed to chair/wheelchair/BSC ?Sit to Stand: Min guard ?  ?  ?  ?  ?  ?General transfer comment: pt complete 1 minute sit<>stand with 8 completed task and DOE 3 out 4 ?  ?  ?Balance Overall balance assessment: Needs assistance ?Sitting-balance support: Bilateral upper extremity supported, Feet supported ?Sitting balance-Leahy Scale: Good ?  ?  ?Standing balance support: Bilateral upper extremity supported, During functional activity, Reliant on assistive device for balance ?Standing balance-Leahy Scale: Fair ?  ?  ?  ?  ?  ?  ?  ?  ?  ?  ?  ?  ?   ? ?ADL either performed or assessed with clinical judgement  ? ?ADL Overall ADL's : Needs assistance/impaired ?Eating/Feeding: Set up ?  ?Grooming: Dance movement psychotherapist;Wash/dry hands;Set up ?  ?Upper Body Bathing: Minimal assistance ?  ?  ?  ?Upper Body Dressing : Min guard;Sitting ?  ?Lower Body Dressing: Min guard;Sit to/from stand ?Lower Body Dressing Details (indicate cue type and reason): able to figure 4 cross at this time. pt noted to have edema in bil LE. pt requires blue socks due to edema ?Toilet Transfer: Min guard;BSC/3in1;Stand-pivot ?Toilet Transfer Details (indicate cue type and reason): pt reports they have told me to [facility] myself and just go to the bathroom. pt with purewick in place and bsc at bedside. pt  was able to transfer to [facility] but with poor line management and unsteady. Recommendation for staff standby (A) ?Toileting- Clothing Manipulation and Hygiene: Min guard;Sitting/lateral lean ?  ?  ?  ?  ?General ADL Comments: with coughing thoughout session. pt provided  flutter valve in room and encouraged to complete 10 time during session. pt states "i cough but nothing comes up" ?  ? ?Extremity/Trunk Assessment Upper Extremity Assessment ?Upper Extremity Assessment: Generalized weakness ?  ?Lower Extremity Assessment ?Lower Extremity Assessment: Generalized weakness ?  ?  ?  ? ?Vision   ?  ?  ?Perception   ?  ?Praxis   ?  ? ?Cognition Arousal/Alertness: Awake/alert ?Behavior During Therapy: Flat affect ?Overall Cognitive Status: Impaired/Different from baseline ?  ?  ?  ?  ?  ?  ?  ?  ?  ?  ?  ?  ?  ?  ?  ?  ?General Comments: pt much more alert and brisk with responses. pt text son in law "help" then called the secretary desk when pt had personal phone in hand. Secretary transfered land line call and pt speaking to family. ?  ?  ?   ?Exercises General Exercises - Lower Extremity ?Ankle Circles/Pumps: AROM, Both, 10 reps, Seated ? ?  ?Shoulder Instructions   ? ? ?  ?General Comments 8 L HFNC VSS  ? ? ?Pertinent Vitals/ Pain       Pain Assessment ?Pain Assessment: No/denies pain ? ?Home Living   ?  ?  ?  ?  ?  ?  ?  ?  ?  ?  ?  ?  ?  ?  ?  ?  ?  ?  ? ?  ?Prior Functioning/Environment    ?  ?  ?  ?   ? ?Frequency ? Min 2X/week  ? ? ? ? ?  ?Progress Toward Goals ? ?OT Goals(current goals can now be found in the care plan section) ? Progress towards OT goals: Progressing toward goals ? ?Acute Rehab OT Goals ?Patient Stated Goal: to get up more and move ?OT Goal Formulation: With patient ?Time For Goal Achievement: 12/02/21 ?Potential to Achieve Goals: Good ?ADL Goals ?Pt Will Perform Grooming: with min guard assist;sitting ?Pt Will Perform Upper Body Bathing: with min guard assist;sitting ?Pt Will Transfer to Toilet: with mod assist;bedside commode;stand pivot transfer ?Additional ADL Goal #1: pt will demonstrates sustained attention to adl task  ?Plan Discharge plan remains appropriate   ? ?Co-evaluation ? ? ?   ?  ?  ?  ?  ? ?  ?AM-PAC OT "6 Clicks" Daily Activity     ?Outcome  Measure ? ? Help from another person eating meals?: A Little ?Help from another person taking care of personal grooming?: A Little ?Help from another person toileting, which includes using toliet, bedpan, or urinal?: A Little ?Help from another person bathing (including washing, rinsing, drying)?: A Little ?Help from another person to put on and taking off regular upper body clothing?: A Little ?Help from another person to put on and taking off regular lower body clothing?: A Little ?6 Click Score: 18 ? ?  ?End of Session Equipment Utilized During Treatment: Oxygen ? ?OT Visit Diagnosis: Unsteadiness on feet (R26.81);Muscle weakness (generalized) (M62.81) ?  ?Activity Tolerance Patient tolerated treatment well ?  ?Patient Left in chair;with call bell/phone within reach;with chair alarm set ?  ?Nurse Communication Mobility status ?  ? ?   ? ?Time: 2440-1027 ?OT Time Calculation (min):  30 min ? ?Charges: OT General Charges ?$OT Visit: 1 Visit ?OT Treatments ?$Self Care/Home Management : 23-37 mins ? ? ?Brynn, OTR/L  ?Acute Rehabilitation Services ?Office: [phone] ?. ? ? ?Jeri Modena ?11/24/2021, 11:54 AM ?

## 2021-11-24 NOTE — TOC Progression Note (Addendum)
Transition of Care (TOC) - Progression Note  ? ? ?Patient Details  ?Name: [name] ?MRN: [id number] ?Date of Birth: 1956/05/07 ? ?Transition of Care (TOC) CM/SW Contact  ?[place], [name] ?Phone Number: ?11/24/2021, 12:30 PM ? ?Clinical Narrative:    ? ?CSW met with pt and pt son bedside. Provided SNF offers and medicare star ratings. Explained medicare coverage of rehab and insurance auth process. Pt and son to discuss and CSW will follow up later today.  ? ?1500: Followed up with pt, she wants [facility]; [facility]. [facility] will start auth.  ? ?  ?Barriers to Discharge: Continued Medical Work up, Ship broker, SNF Pending bed offer ? ?Expected Discharge Plan and Services ?  ?In-house Referral: Clinical Social Work ?  ?Post Acute Care Choice: IP Rehab, [place] ?Living arrangements for the past 2 months: [place] ?                ?  ?  ?  ?  ?  ?  ?  ?  ?  ?  ? ? ?Social Determinants of Health (SDOH) Interventions ?  ? ?Readmission Risk Interventions ?   ? View : No data to display.  ?  ?  ?  ? ? ?

## 2021-11-24 NOTE — Progress Notes (Addendum)
1245: family requested to speak with staff. I responded spoke with the son-in-law regarding some concerns. He voiced some concerns regarding the patient sitting in urine for a number of hours, he nor the patient were able to specify when this occurred, I asked patient if this was going on when I had previously been in the room and she stated "no". The patient later stated it had happened a few days earlier. Another concern was regarding PT, frequency and duration. They were under the impression that the patient would participate in PT 3 times a day for an undetermined length of time. I explain this is not the case and that PT would be limited in her current location and PT would increase once discharged to rehab. I would however, follow up with PT to make sure of the order. I also informed them I would contact the attending to stop in and speak with them. Attending and Charge both address these concerns in addition.  ?

## 2021-11-25 ENCOUNTER — Inpatient Hospital Stay (HOSPITAL_COMMUNITY): Payer: Medicare HMO

## 2021-11-25 DIAGNOSIS — G9340 Encephalopathy, unspecified: Secondary | ICD-10-CM | POA: Diagnosis not present

## 2021-11-25 DIAGNOSIS — J9621 Acute and chronic respiratory failure with hypoxia: Secondary | ICD-10-CM | POA: Diagnosis not present

## 2021-11-25 DIAGNOSIS — N179 Acute kidney failure, unspecified: Secondary | ICD-10-CM | POA: Diagnosis not present

## 2021-11-25 DIAGNOSIS — A419 Sepsis, unspecified organism: Secondary | ICD-10-CM | POA: Diagnosis not present

## 2021-11-25 LAB — BASIC METABOLIC PANEL
Anion gap: 8 (ref 5–15)
BUN: 9 mg/dL (ref 8–23)
CO2: 42 mmol/L — ABNORMAL HIGH (ref 22–32)
Calcium: 8.6 mg/dL — ABNORMAL LOW (ref 8.9–10.3)
Chloride: 90 mmol/L — ABNORMAL LOW (ref 98–111)
Creatinine, Ser: 0.93 mg/dL (ref 0.44–1.00)
GFR, Estimated: >60 mL/min (ref >60)
Glucose, Bld: 105 mg/dL — ABNORMAL HIGH (ref 70–99)
Potassium: 3.3 mmol/L — ABNORMAL LOW (ref 3.5–5.1)
Sodium: 140 mmol/L (ref 135–145)

## 2021-11-25 LAB — GLUCOSE, CAPILLARY
Glucose-Capillary: 103 mg/dL — ABNORMAL HIGH (ref 70–99)
Glucose-Capillary: 111 mg/dL — ABNORMAL HIGH (ref 70–99)
Glucose-Capillary: 125 mg/dL — ABNORMAL HIGH (ref 70–99)
Glucose-Capillary: 127 mg/dL — ABNORMAL HIGH (ref 70–99)
Glucose-Capillary: 178 mg/dL — ABNORMAL HIGH (ref 70–99)
Glucose-Capillary: 98 mg/dL (ref 70–99)

## 2021-11-25 MED ORDER — POTASSIUM CHLORIDE CRYS ER 20 MEQ PO TBCR
40.0000 meq | EXTENDED_RELEASE_TABLET | Freq: Four times a day (QID) | ORAL | Status: AC
  Administered 2021-11-25 (×2): 40 meq via ORAL
  Filled 2021-11-25 (×2): qty 2

## 2021-11-25 MED ORDER — IOHEXOL 350 MG/ML SOLN
100.0000 mL | Freq: Once | INTRAVENOUS | Status: AC | PRN
  Administered 2021-11-25: 75 mL via INTRAVENOUS

## 2021-11-25 MED ORDER — ACETAZOLAMIDE 250 MG PO TABS
250.0000 mg | ORAL_TABLET | Freq: Two times a day (BID) | ORAL | Status: DC
  Administered 2021-11-26 – 2021-11-27 (×3): 250 mg via ORAL
  Filled 2021-11-25 (×4): qty 1

## 2021-11-25 MED ORDER — FUROSEMIDE 10 MG/ML IJ SOLN
40.0000 mg | Freq: Two times a day (BID) | INTRAMUSCULAR | Status: DC
  Administered 2021-11-25 – 2021-11-27 (×4): 40 mg via INTRAVENOUS
  Filled 2021-11-25 (×4): qty 4

## 2021-11-25 NOTE — Progress Notes (Signed)
?      ?                 PROGRESS NOTE ? ?      ?PATIENT DETAILS ?Name: [name] ?Age: 66 y.o. ?Sex: female ?Date of Birth: 09-30-1955 ?Admit Date: 11/15/2021 ?Admitting Physician Lanier Clam, MD ?XTG:GYIRSW, [name], FNP ? ?Brief Summary: ?Patient is a 66 y.o.  female with history of CAD s/p PCI, HTN, HLD COPD, CKD stage IIIa-who presented to the hospital with altered mental status-felt to have acute metabolic encephalopathy-septic shock due to PNA.  Intubated and admitted by PCCM-upon stability-transfer to [facility] ? ?Significant events: ?4/22>> presented to [facility] ED with encephalopathy, shock-found to have PNA-intubated and transferred to [facility]. ?4/24>> extubated ?4/26>> transfer to [facility]. ? ?Significant studies: ?4/22>> CT head: No acute intracranial abnormality. ?4/23>> CXR: Increased opacity in left lower lobe. ?4/23>> CT chest/abdomen/pelvis: Extensive left-sided PNA, thyroid nodules. ?4/23>> CT head: No acute intracranial abnormality. ? ?Significant microbiology data: ?4/22>> influenza/COVID PCR: Negative ?4/22>> blood culture: Negative ?4/23>> blood culture: Negative ?4/23>> urine culture: E. 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 ?5/02>> CTA chest: No PE ? ?Procedures: ?4/22-4/24>> ETT ? ?Consults: ?None  ? ?Subjective: ?Feels better-complaining about amount and quality of hospital food.  Continues to have some swelling in her legs.  Finally used BiPAP last night. ? ?Objective: ?Vitals: ?Blood pressure 124/72, pulse 82, temperature 97.8 ?F (36.6 ?C), temperature source Oral, resp. rate 19, height _0  (1.651 m), weight 108 kg, last menstrual period 10/17/2005, SpO2 100 %.  ? ?Exam: ?Gen Exam:Alert awake-not in any distress ?HEENT:atraumatic, normocephalic ?Chest: B/L clear to auscultation anteriorly ?CVS:S1S2 regular ?Abdomen:soft non tender, non distended ?Extremities:no edema ?Neurology: Non focal ?Skin: no rash  ? ?Pertinent  Labs/Radiology: ? ?  Latest Ref Rng & Units 11/22/2021  ?  1:12 AM 11/21/2021  ?  2:47 AM 11/20/2021  ?  1:57 AM  ?CBC  ?WBC 4.0 - 10.5 K/uL 13.4   16.3   18.6    ?Hemoglobin 12.0 - 15.0 g/dL 10.8   10.6   10.9    ?Hematocrit 36.0 - 46.0 % 37.5   37.7   39.8    ?Platelets 150 - 400 K/uL 127   108   107    ?  ?Lab Results  ?Component Value Date  ? NA 140 11/25/2021  ? K 3.3 (L) 11/25/2021  ? CL 90 (L) 11/25/2021  ? CO2 42 (H) 11/25/2021  ? ?  ? ? ?Assessment/Plan: ?Septic shock: Due to PNA-sepsis physiology has improved-has completed a course of 7 days of antimicrobial therapy on 4/28.  Remains afebrile-and stable off antimicrobial therapy. ? ?Acute metabolic encephalopathy: Multifactorial in the setting of sepsis-with some contributions from benzos, mild hypercarbia.  Encephalopathy has resolved-CT head x2 negative for acute abnormality.  ? ?Acute hypoxic respiratory failure: Likely due to PNA-and HFpEF exacerbation superimposed on underlying COPD.  Claims today that she has oxygen at home that she only uses as needed with ambulation.  Although improved-she is still requiring anywhere from 5-7 L HFNC-CTA chest done earlier this morning without PE.  Continue diuresis-encourage mobilization-use of incentive spirometry and bronchodilators.  Will require home O2 on discharge-and continuation of BiPAP.   ? ?Acute on chronic HFpEF: Volume status better-still with some lower extremity edema-continue IV Lasix-continue to attempt to titrate down FiO2.   ? ?AKI on CKD stage IIIa: AKI hemodynamically mediated-creatinine has now normalized. ? ?Shock liver: Again improvement in transaminases-follow periodically. ? ?  CAD s/p PCI to RCA in 2012: No anginal symptoms-continue ASA.  Reviewed last cardiology-last LHC 10/07/2020-showed stable CAD-recommendations were for medical management.  Resume statin/beta-blocker once LFTs and BP permit. ? ?HTN: BP stable-midodrine has been discontinued-watch closely and resume antihypertensives when  able. ? ?Anxiety/depression: Appears relatively stable-since developed lethargy with benzodiazepines-Xanax dose lowered and changed to as needed.  Remains awake/alert on this regimen-continue Lexapro   ? ?Restless leg syndrome: On Mirapex. ? ?History of breast cancer: Stable for outpatient follow-up with PCP. ? ?COPD: Not in exacerbation-continue bronchodilators. ? ?Probable OSA: Given hypercarbia-PCCM recommending BiPAP nightly. ? ?Bilateral thyroid nodule: Seen incidentally on CT chest-stable for outpatient follow-up with PCP.  TSH stable. ? ?Pressure Ulcer: ?Pressure Injury 11/16/21 Buttocks Left;Medial Stage 2 -  Partial thickness loss of dermis presenting as a shallow open injury with a red, pink wound bed without slough. Red open area (Active)  ?11/16/21 1030  ?Location: Buttocks  ?Location Orientation: Left;Medial  ?Staging: Stage 2 -  Partial thickness loss of dermis presenting as a shallow open injury with a red, pink wound bed without slough.  ?Wound Description (Comments): Red open area  ?Present on Admission: Yes  ?Dressing Type Foam - Lift dressing to assess site every shift 11/25/21 0900  ? ?Obesity: ?Estimated body mass index is 39.62 kg/m? as calculated from the following: ?  Height as of this encounter: _0  (1.651 m). ?  Weight as of this encounter: 108 kg.  ? ?Code status: ?  Code Status: Prior  ? ?DVT Prophylaxis: ?enoxaparin (LOVENOX) injection 40 mg Start: 11/20/21 2200 ?SCDs Start: 11/16/21 1312 ?  ?Family Communication: Son in law-Brandon Benge-[phone]-on 4/29 ? ? ?Disposition Plan: ?Status is: Inpatient ?Remains inpatient appropriate because: Resolving hypoxia-still on 4-6 L of oxygen-not yet stable for discharge.  . ?  ?Planned Discharge Destination: SNF ? ? ?Diet: ?Diet Order   ? ?       ?  DIET DYS 3 Room service appropriate? Yes; Fluid consistency: Thin  Diet effective now       ?  ? ?  ?  ? ?  ?  ? ? ?Antimicrobial agents: ?Anti-infectives (From admission, onward)  ? ? Start      Dose/Rate Route Frequency Ordered Stop  ? 11/21/21 1000  cefTRIAXone (ROCEPHIN) 2 g in sodium chloride 0.9 % 100 mL IVPB       ? 2 g ?200 mL/hr over 30 Minutes Intravenous Every 24 hours 11/20/21 1128 11/22/21 0813  ? 11/18/21 1000  cefTRIAXone (ROCEPHIN) 2 g in sodium chloride 0.9 % 100 mL IVPB       ? 2 g ?200 mL/hr over 30 Minutes Intravenous Every 24 hours 11/18/21 0906 11/20/21 1357  ? 11/16/21 2200  ceFEPIme (MAXIPIME) 2 g in sodium chloride 0.9 % 100 mL IVPB  Status:  Discontinued       ? 2 g ?200 mL/hr over 30 Minutes Intravenous Every 24 hours 11/16/21 0627 11/18/21 0906  ? 11/16/21 1000  vancomycin (VANCOREADY) IVPB 1250 mg/250 mL  Status:  Discontinued       ? 1,250 mg ?166.7 mL/hr over 90 Minutes Intravenous Every 48 hours 11/16/21 0627 11/17/21 0844  ? 11/16/21 0015  ceFEPIme (MAXIPIME) 2 g in sodium chloride 0.9 % 100 mL IVPB       ? 2 g ?200 mL/hr over 30 Minutes Intravenous  Once 11/16/21 0001 11/16/21 0054  ? 11/16/21 0015  metroNIDAZOLE (FLAGYL) IVPB 500 mg       ? 500 mg ?100  mL/hr over 60 Minutes Intravenous  Once 11/16/21 0001 11/16/21 0147  ? 11/16/21 0015  vancomycin (VANCOCIN) IVPB 1000 mg/200 mL premix       ? 1,000 mg ?200 mL/hr over 60 Minutes Intravenous  Once 11/16/21 0001 11/16/21 0246  ? ?  ? ? ? ?MEDICATIONS: ?Scheduled Meds: ? arformoterol  15 mcg Nebulization BID  ? aspirin  81 mg Oral Daily  ? budesonide (PULMICORT) nebulizer solution  0.5 mg Nebulization BID  ? chlorhexidine gluconate (MEDLINE KIT)  15 mL Mouth Rinse BID  ? docusate  100 mg Oral BID  ? enoxaparin (LOVENOX) injection  40 mg Subcutaneous Q24H  ? escitalopram  20 mg Oral Daily  ? [START ON 11/26/2021] furosemide  40 mg Intravenous Q12H  ? Gerhardt's butt cream   Topical BID  ? guaiFENesin  600 mg Oral BID  ? ipratropium-albuterol  3 mL Nebulization BID  ? mouth rinse  15 mL Mouth Rinse BID  ? polyethylene glycol  17 g Oral Daily  ? potassium chloride  40 mEq Oral Q6H  ? pramipexole  1 mg Oral BID  ? sodium chloride  flush  10-40 mL Intracatheter Q12H  ? umeclidinium bromide  1 puff Inhalation Daily  ? ?Continuous Infusions: ? sodium chloride Stopped (11/18/21 0436)  ? ?PRN Meds:.albuterol, ALPRAZolam, nitroGLYCERIN, s

## 2021-11-25 NOTE — TOC Progression Note (Signed)
Transition of Care (TOC) - Progression Note  ? ? ?Patient Details  ?Name: LATIMA HAMZA ?MRN: [id number] ?Date of Birth: 1955/12/02 ? ?Transition of Care (TOC) CM/SW Contact  ?Benard Halsted, [name] ?Phone Number: ?11/25/2021, 11:46 AM ? ?Clinical Narrative:    ?CSW requested [facility] order Bipap for patient; settings provided.  ? ? ?  ?Barriers to Discharge: Continued Medical Work up, Ship broker, SNF Pending bed offer ? ?Expected Discharge Plan and Services ?  ?In-house Referral: Clinical Social Work ?  ?Post Acute Care Choice: IP Rehab, [facility] ?Living arrangements for the past 2 months: [place] ?                ?  ?  ?  ?  ?  ?  ?  ?  ?  ?  ? ? ?Social Determinants of Health (SDOH) Interventions ?  ? ?Readmission Risk Interventions ?   ? View : No data to display.  ?  ?  ?  ? ? ?

## 2021-11-25 NOTE — Progress Notes (Signed)
Pt showing no sign of respiratory distress states she will have RN call if she decides to go on BIPAP.  ?

## 2021-11-25 NOTE — Consult Note (Signed)
? ?  [facility] CM Inpatient Consult ? ? ?11/25/2021 ? ?[name] ?1955/10/26 ?[id number] ? ?[facility] [ACO] Patient: [employer]  ? ?Primary Care Provider:  [name], FNP at [facility] is an embedded provider with a Chronic Care Management team and program, and is has shown active status with the Embedded [name] noted.  ? ?Patient was screened for length of stay 9 days and PCP is in an Embedded practice service needs with a chronic care management team in which currently is being recommended for a skilled nursing facility level of care for post hospital transitional needs. ? ?Plan: Will continue to follow notification sent to the [facility] Management team for current transition for a skilled nursing facility level of care.  ? ?Please contact for further questions, ? ?Natividad Brood, RN BSN CCM ?[facility] Liaison ? [phone] business mobile phone ?Toll free office [phone]  ?Fax number: [phone] ?[email or web address] ? ? ? ?

## 2021-11-25 NOTE — Progress Notes (Signed)
Speech Language Pathology Treatment: Dysphagia  ?Patient Details ?Name: SHERISSA TENENBAUM ?MRN: [id number] ?DOB: 1955/10/16 ?Today's Date: 11/25/2021 ?Time: 1220-1240 ?SLP Time Calculation (min) (ACUTE ONLY): 20 min ? ?Assessment / Plan / Recommendation ?Clinical Impression ? Patient seen by SLP for skilled treatment session focused on dysphagia goals. When SLP arrived into room, patient had transfered self into bedside commode. She was waiting on her lunch tray that had not come yet. At rest, sitting up in recliner, patient's oxygen saturations were in the mid to high 90%s and only dropped to 88% when transfering from commode to chair. SLP did not observe any confusion or distractability as was noted in 4/26 SLP note. SLP brought patient some peanut butter and crackers, peanut butter and juice but she only drank juice while SLP present. No overt s/s aspiration or penetration and although she had a couple instances of dry sounding cough, this was observed prior to PO's and no change in frequency during or after PO's. Patient reported that she is getting tired of the same things for breakfast, etc. SLP then discussed upgrading her solid food textures from Dys 3 to regular and for her to order foods she knows she can tolerate (has dentures but does eat slightly softer foods at baseline). Patient then reviewed menu to find something to order for dinner this evening. SLP plans to see patient at least one more time to ensure toleration of upgraded diet.  ? ?  ?HPI HPI: Patient is a 66 year old female PMH of anxiety/depression, CAD, HTN, HLD, CKD, COPD, breast cancer presents to [facility] ED on 4/22 with AMS. Pt fouind on sofa not responsive and covered in fecces. Found to have septic shock, UA with small leukocytes, LLL opacity and acute respiratory failure requiring intubation 4/23-4/24. ?  ?   ?SLP Plan ? Continue with current plan of care ? ?  ?  ?Recommendations for follow up therapy are one component of a multi-disciplinary  discharge planning process, led by the attending physician.  Recommendations may be updated based on patient status, additional functional criteria and insurance authorization. ?  ? ?Recommendations  ?Diet recommendations: Regular;Thin liquid ?Liquids provided via: Cup;Straw ?Medication Administration: Whole meds with puree ?Supervision: Staff to assist with self feeding ?Compensations: Slow rate;Small sips/bites ?Postural Changes and/or Swallow Maneuvers: Seated upright 90 degrees  ?   ?    ?   ? ? ? ? Oral Care Recommendations: Oral care BID ?Follow Up Recommendations: No SLP follow up ?Assistance recommended at discharge: Frequent or constant Supervision/Assistance ?SLP Visit Diagnosis: Dysphagia, unspecified (R13.10) ?Plan: Continue with current plan of care ? ? ? ? ?  ?  ? ? ?Sonia Baller, MA, CCC-SLP ?Speech Therapy ? ?

## 2021-11-25 NOTE — Progress Notes (Signed)
Physical Therapy Treatment ?Patient Details ?Name: [name] ?MRN: [id number] ?DOB: 1955/08/10 ?Today's Date: 11/25/2021 ? ? ?History of Present Illness Pt is 66 yo female found AMS unresponsive 4/23 in feces with septic shock (+) UA intubated 4/23-4/24.  Pt with PNE. PMH anxiety, breast CA, CAD, nstemi 2008, COPD depression, HLD, HTN MI, stable angina, stress incontinence, lumpectomy 2012, R elbow surg 2008 ? ?  ?PT Comments  ? ? Patient doing better and able to progress to ambulating 25 ft x 3 reps with seated rests in between. On 6L with lowest sats 84-89% with recovery to 94% in sitting within 90 seconds. Will increase frequency as pt could potentially return home if continues to make excellent progress. ?   ?Recommendations for follow up therapy are one component of a multi-disciplinary discharge planning process, led by the attending physician.  Recommendations may be updated based on patient status, additional functional criteria and insurance authorization. ? ?Follow Up Recommendations ? Skilled nursing-short term rehab (<3 hours/day) ?  ?  ?Assistance Recommended at Discharge Frequent or constant Supervision/Assistance  ?Patient can return home with the following A little help with walking and/or transfers;Assistance with cooking/housework;Direct supervision/assist for medications management;Direct supervision/assist for financial management;Help with stairs or ramp for entrance ?  ?Equipment Recommendations ? Rolling walker (2 wheels);Wheelchair cushion (measurements PT);Wheelchair (measurements PT);BSC/3in1  ?  ?Recommendations for Other Services   ? ? ?  ?Precautions / Restrictions Precautions ?Precautions: Fall ?Precaution Comments: fall risk watch BP, HFNC 6L ?Restrictions ?Weight Bearing Restrictions: No  ?  ? ?Mobility ? Bed Mobility ?Overal bed mobility: Needs Assistance ?Bed Mobility: Supine to Sit ?Rolling: Modified independent (Device/Increase time) ?  ?Supine to sit: Modified independent  (Device/Increase time) ?  ?  ?General bed mobility comments: pt requires increased time ; HOB flat and no rail ?  ? ?Transfers ?Overall transfer level: Needs assistance ?Equipment used: Rolling walker (2 wheels), None ?Transfers: Sit to/from Stand, Bed to chair/wheelchair/BSC ?Sit to Stand: Supervision ?Stand pivot transfers: Supervision ?  ?  ?  ?  ?General transfer comment: transfer bed to [facility] without device with BSC right next to bed; no cues needed and good awareness of lines; transferred to RW x 3 reps with cues for hand placement only once ?  ? ?Ambulation/Gait ?Ambulation/Gait assistance: Min guard ?Gait Distance (Feet): 25 Feet (x 3 reps with seated rest between) ?Assistive device: Rolling walker (2 wheels) ?Gait Pattern/deviations: Decreased stride length, Trunk flexed, Step-through pattern ?Gait velocity: decreased ?  ?  ?General Gait Details: on 6L with sats ?down to 84% (poor pleth and took a long time for waveform to recover--which was then 90%); 2nd, 3rd walks sats 89% and recover to 95% at rest ? ? ?Stairs ?  ?  ?  ?  ?  ? ? ?Wheelchair Mobility ?  ? ?Modified Rankin (Stroke Patients Only) ?  ? ? ?  ?Balance Overall balance assessment: Needs assistance ?Sitting-balance support: Bilateral upper extremity supported, Feet supported ?Sitting balance-Leahy Scale: Good ?  ?  ?Standing balance support: Bilateral upper extremity supported, During functional activity, Reliant on assistive device for balance ?Standing balance-Leahy Scale: Fair ?  ?  ?  ?  ?  ?  ?  ?  ?  ?  ?  ?  ?  ? ?  ?Cognition Arousal/Alertness: Awake/alert ?Behavior During Therapy: [facility] for tasks assessed/performed ?  ?  ?  ?  ?  ?  ?  ?  ?  ?  ?  ?  ?  ?  ?  ?  ?  ?  General Comments: not specifically tested ?  ?  ? ?  ?Exercises General Exercises - Lower Extremity ?Ankle Circles/Pumps: AROM, Both, 10 reps, Seated ? ?  ?General Comments   ?  ?  ? ?Pertinent Vitals/Pain Pain Assessment ?Pain Assessment: No/denies pain  ? ? ?Home Living   ?  ?   ?  ?  ?  ?  ?  ?  ?  ?   ?  ?Prior Function    ?  ?  ?   ? ?PT Goals (current goals can now be found in the care plan section) Acute Rehab PT Goals ?Patient Stated Goal: get back to dog Yoshi ?Time For Goal Achievement: 12/02/21 ?Potential to Achieve Goals: Good ?Progress towards PT goals: Progressing toward goals ? ?  ?Frequency ? ? ? Min 3X/week ? ? ? ?  ?PT Plan Frequency needs to be updated;Current plan remains appropriate  ? ? ?Co-evaluation   ?  ?  ?  ?  ? ?  ?AM-PAC PT "6 Clicks" Mobility   ?Outcome Measure ? Help needed turning from your back to your side while in a flat bed without using bedrails?: None ?Help needed moving from lying on your back to sitting on the side of a flat bed without using bedrails?: None ?Help needed moving to and from a bed to a chair (including a wheelchair)?: A Little ?Help needed standing up from a chair using your arms (e.g., wheelchair or bedside chair)?: A Little ?Help needed to walk in hospital room?: A Little ?Help needed climbing 3-5 steps with a railing? : A Lot ?6 Click Score: 19 ? ?  ?End of Session Equipment Utilized During Treatment: Oxygen ?Activity Tolerance: Patient limited by fatigue ?Patient left: with call bell/phone within reach;in chair ?Nurse Communication: Mobility status;Other (comment) (per pt nursing allowing transfer to/from [facility] on her own and alarm not set) ?PT Visit Diagnosis: Other abnormalities of gait and mobility (R26.89);Muscle weakness (generalized) (M62.81);History of falling (Z91.81) ?  ? ? ?Time: 1607-3710 ?PT Time Calculation (min) (ACUTE ONLY): 37 min ? ?Charges:  $Gait Training: 23-37 mins          ?          ? ?Arby Barrette, PT ?Acute Rehabilitation Services  ?Pager [phone] ?Office [phone] ? ? ? ?[name] ?11/25/2021, 11:57 AM ? ?

## 2021-11-26 DIAGNOSIS — A419 Sepsis, unspecified organism: Secondary | ICD-10-CM | POA: Diagnosis not present

## 2021-11-26 DIAGNOSIS — N179 Acute kidney failure, unspecified: Secondary | ICD-10-CM | POA: Diagnosis not present

## 2021-11-26 DIAGNOSIS — J9621 Acute and chronic respiratory failure with hypoxia: Secondary | ICD-10-CM | POA: Diagnosis not present

## 2021-11-26 DIAGNOSIS — G9340 Encephalopathy, unspecified: Secondary | ICD-10-CM | POA: Diagnosis not present

## 2021-11-26 LAB — GLUCOSE, CAPILLARY
Glucose-Capillary: 107 mg/dL — ABNORMAL HIGH (ref 70–99)
Glucose-Capillary: 107 mg/dL — ABNORMAL HIGH (ref 70–99)
Glucose-Capillary: 129 mg/dL — ABNORMAL HIGH (ref 70–99)
Glucose-Capillary: 133 mg/dL — ABNORMAL HIGH (ref 70–99)
Glucose-Capillary: 137 mg/dL — ABNORMAL HIGH (ref 70–99)
Glucose-Capillary: 162 mg/dL — ABNORMAL HIGH (ref 70–99)

## 2021-11-26 LAB — MAGNESIUM: Magnesium: 1.8 mg/dL (ref 1.7–2.4)

## 2021-11-26 LAB — BASIC METABOLIC PANEL
Anion gap: 9 (ref 5–15)
BUN: 12 mg/dL (ref 8–23)
CO2: 38 mmol/L — ABNORMAL HIGH (ref 22–32)
Calcium: 9.1 mg/dL (ref 8.9–10.3)
Chloride: 94 mmol/L — ABNORMAL LOW (ref 98–111)
Creatinine, Ser: 0.93 mg/dL (ref 0.44–1.00)
GFR, Estimated: >60 mL/min (ref >60)
Glucose, Bld: 118 mg/dL — ABNORMAL HIGH (ref 70–99)
Potassium: 3.6 mmol/L (ref 3.5–5.1)
Sodium: 141 mmol/L (ref 135–145)

## 2021-11-26 MED ORDER — POTASSIUM CHLORIDE CRYS ER 20 MEQ PO TBCR
40.0000 meq | EXTENDED_RELEASE_TABLET | Freq: Every day | ORAL | Status: AC
  Administered 2021-11-26 – 2021-11-27 (×2): 40 meq via ORAL
  Filled 2021-11-26 (×2): qty 2

## 2021-11-26 MED ORDER — LOPERAMIDE HCL 2 MG PO CAPS: 4.0000 mg | ORAL_CAPSULE | ORAL | Status: DC | PRN

## 2021-11-26 MED ORDER — MAGNESIUM SULFATE 2 GM/50ML IV SOLN
2.0000 g | Freq: Once | INTRAVENOUS | Status: AC
  Administered 2021-11-26: 2 g via INTRAVENOUS
  Filled 2021-11-26: qty 50

## 2021-11-26 NOTE — Progress Notes (Signed)
Occupational Therapy Treatment ?Patient Details ?Name: [name] ?MRN: [id number] ?DOB: [birth year] ?Today's Date: 11/26/2021 ? ? ?History of present illness Pt is 66 yo female found AMS unresponsive 4/23 in feces with septic shock (+) UA intubated 4/23-4/24.  Pt with PNE. PMH anxiety, breast CA, CAD, nstemi 2008, COPD depression, HLD, HTN MI, stable angina, stress incontinence, lumpectomy 2012, R elbow surg 2008 ?  ?OT comments ? Pt is routinely using BSC independently. Completed dressing and grooming from chair with set up. Pt on 6L 02 with Sp02 of 92% during ADLs and transfers. Educated in pursed lip breathing and pacing.  ? ?Recommendations for follow up therapy are one component of a multi-disciplinary discharge planning process, led by the attending physician.  Recommendations may be updated based on patient status, additional functional criteria and insurance authorization. ?   ?Follow Up Recommendations ? Skilled nursing-short term rehab (<3 hours/day)  ?  ?Assistance Recommended at Discharge Frequent or constant Supervision/Assistance  ?Patient can return home with the following ? A little help with bathing/dressing/bathroom;Assistance with cooking/housework;Assist for transportation;Help with stairs or ramp for entrance ?  ?Equipment Recommendations ?    ?  ?Recommendations for Other Services   ? ?  ?Precautions / Restrictions Precautions ?Precautions: Fall  ? ? ?  ? ?Mobility Bed Mobility ?Overal bed mobility: Modified Independent ?  ?  ?  ?  ?  ?  ?General bed mobility comments: HOB up ?  ? ?Transfers ?Overall transfer level: Needs assistance ?Equipment used: None ?Transfers: Sit to/from Stand, Bed to chair/wheelchair/BSC ?Sit to Stand: Modified independent (Device/Increase time) ?  ?  ?  ?  ?  ?General transfer comment: bed to [facility] to recliner back to bed ?  ?  ?Balance Overall balance assessment: Needs assistance ?  ?Sitting balance-Leahy Scale: Good ?  ?  ?  ?Standing balance-Leahy Scale: Fair ?  ?  ?   ?  ?  ?  ?  ?  ?  ?  ?  ?  ?   ? ?ADL either performed or assessed with clinical judgement  ? ?ADL Overall ADL's : Needs assistance/impaired ?  ?  ?Grooming: Dance movement psychotherapist;Wash/dry hands;Set up;Brushing hair;Oral care;Sitting ?  ?  ?  ?  ?  ?Upper Body Dressing : Set up;Sitting ?  ?Lower Body Dressing: Set up;Sitting/lateral leans ?  ?Toilet Transfer: Modified Independent;Stand-pivot;BSC/3in1 ?  ?Toileting- Clothing Manipulation and Hygiene: Modified independent;Sitting/lateral lean ?  ?  ?  ?  ?General ADL Comments: Educated in pursed lip breathing and pacing. ?  ? ?Extremity/Trunk Assessment   ?  ?  ?  ?  ?  ? ?Vision   ?  ?  ?Perception   ?  ?Praxis   ?  ? ?Cognition Arousal/Alertness: Awake/alert ?Behavior During Therapy: [facility] for tasks assessed/performed ?Overall Cognitive Status: Impaired/Different from baseline ?Area of Impairment: Safety/judgement ?  ?  ?  ?  ?  ?  ?  ?  ?  ?  ?  ?  ?Safety/Judgement: Decreased awareness of deficits ?  ?  ?General Comments: pt states her family is cleaning up her home, agreeable to [facility] in SNF, but thinks she can manage at home ?  ?  ?   ?Exercises   ? ?  ?Shoulder Instructions   ? ? ?  ?General Comments    ? ? ?Pertinent Vitals/ Pain       Pain Assessment ?Pain Assessment: No/denies pain ? ?Home Living   ?  ?  ?  ?  ?  ?  ?  ?  ?  ?  ?  ?  ?  ?  ?  ?  ?  ?  ? ?  ?  Prior Functioning/Environment    ?  ?  ?  ?   ? ?Frequency ? Min 2X/week  ? ? ? ? ?  ?Progress Toward Goals ? ?OT Goals(current goals can now be found in the care plan section) ? Progress towards OT goals: Progressing toward goals ? ?Acute Rehab OT Goals ?OT Goal Formulation: With patient ?Time For Goal Achievement: 12/02/21 ?Potential to Achieve Goals: Good  ?Plan Discharge plan needs to be updated   ? ?Co-evaluation ? ? ?   ?  ?  ?  ?  ? ?  ?AM-PAC OT "6 Clicks" Daily Activity     ?Outcome Measure ? ? Help from another person eating meals?: None ?Help from another person taking care of personal grooming?: A  Little ?Help from another person toileting, which includes using toliet, bedpan, or urinal?: None ?Help from another person bathing (including washing, rinsing, drying)?: A Little ?Help from another person to put on and taking off regular upper body clothing?: None ?Help from another person to put on and taking off regular lower body clothing?: None ?6 Click Score: 22 ? ?  ?End of Session Equipment Utilized During Treatment: Oxygen (6L) ? ?OT Visit Diagnosis: Unsteadiness on feet (R26.81);Muscle weakness (generalized) (M62.81) ?  ?Activity Tolerance Patient tolerated treatment well ?  ?Patient Left in bed;with call bell/phone within reach;with nursing/sitter in room ?  ?Nurse Communication   ?  ? ?   ? ?Time: 3662-9476 ?OT Time Calculation (min): 23 min ? ?Charges: OT General Charges ?$OT Visit: 1 Visit ?OT Treatments ?$Self Care/Home Management : 23-37 mins ? ?Nestor Lewandowsky, OTR/L ?Acute Rehabilitation Services ?Pager: [phone] ?Office: [phone]  ? ?Malka So ?11/26/2021, 12:33 PM ?

## 2021-11-26 NOTE — Plan of Care (Signed)
?  Problem: Clinical Measurements: Goal: Will remain free from infection Outcome: Progressing Goal: Diagnostic test results will improve Outcome: Progressing Goal: Respiratory complications will improve Outcome: Progressing   Problem: Activity: Goal: Risk for activity intolerance will decrease Outcome: Progressing   Problem: Nutrition: Goal: Adequate nutrition will be maintained Outcome: Progressing   Problem: Coping: Goal: Level of anxiety will decrease Outcome: Progressing   

## 2021-11-26 NOTE — Plan of Care (Signed)

## 2021-11-26 NOTE — Progress Notes (Signed)
?      ?                 PROGRESS NOTE ? ?      ?PATIENT DETAILS ?Name: [name] ?Age: 66 y.o. ?Sex: female ?Date of Birth: 1955/08/03 ?Admit Date: 11/15/2021 ?Admitting Physician Lanier Clam, MD ?[id number], [name], FNP ? ?Brief Summary: ?Patient is a 66 y.o.  female with history of CAD s/p PCI, HTN, HLD COPD, CKD stage IIIa-who presented to the hospital with altered mental status-felt to have acute metabolic encephalopathy-septic shock due to PNA.  Intubated and admitted by PCCM-upon stability-transfer to [facility]. ? ?Significant events: ?4/22>> presented to [facility] ED with encephalopathy, shock-found to have PNA-intubated and transferred to [facility]. ?4/24>> extubated ?4/26>> transfer to [facility]. ? ?Significant studies: ?4/22>> CT head: No acute intracranial abnormality. ?4/23>> CXR: Increased opacity in left lower lobe. ?4/23>> CT chest/abdomen/pelvis: Extensive left-sided PNA, thyroid nodules. ?4/23>> CT head: No acute intracranial abnormality. ? ?Significant microbiology data: ?4/22>> influenza/COVID PCR: Negative ?4/22>> blood culture: Negative ?4/23>> blood culture: Negative ?4/23>> urine culture: E. 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 ?5/02>> CTA chest: No PE ? ?Procedures: ?4/22-4/24>> ETT ? ?Consults: ?None  ? ?Subjective: ?No major issues overnight-was on 7 L of HFNC this morning-I titrated down to 4 L. ? ?Objective: ?Vitals: ?Blood pressure 114/74, pulse 82, temperature 98.5 ?F (36.9 ?C), temperature source Oral, resp. rate 18, height _0  (1.651 m), weight 98.4 kg, last menstrual period 10/17/2005, SpO2 94 %.  ? ?Exam: ?Gen Exam:Alert awake-not in any distress ?HEENT:atraumatic, normocephalic ?Chest: B/L clear to auscultation anteriorly ?CVS:S1S2 regular ?Abdomen:soft non tender, non distended ?Extremities:no edema ?Neurology: Non focal ?Skin: no rash  ? ?Pertinent Labs/Radiology: ? ?  Latest Ref Rng & Units 11/22/2021  ?  1:12  AM 11/21/2021  ?  2:47 AM 11/20/2021  ?  1:57 AM  ?CBC  ?WBC 4.0 - 10.5 K/uL 13.4   16.3   18.6    ?Hemoglobin 12.0 - 15.0 g/dL 10.8   10.6   10.9    ?Hematocrit 36.0 - 46.0 % 37.5   37.7   39.8    ?Platelets 150 - 400 K/uL 127   108   107    ?  ?Lab Results  ?Component Value Date  ? NA 141 11/26/2021  ? K 3.6 11/26/2021  ? CL 94 (L) 11/26/2021  ? CO2 38 (H) 11/26/2021  ? ?  ? ? ?Assessment/Plan: ?Septic shock: Due to PNA-sepsis physiology has improved-has completed a course of 7 days of antimicrobial therapy on 4/28.  Remains afebrile-and stable off antimicrobial therapy. ? ?Acute metabolic encephalopathy: Multifactorial in the setting of sepsis-with some contributions from benzos, mild hypercarbia.  Encephalopathy has resolved-CT head x2 negative for acute abnormality.  ? ?Acute hypoxic respiratory failure: Likely due to PNA-and HFpEF exacerbation superimposed on underlying COPD.  Slowly improving-continue bronchodilators and diuretics.  Given underlying COPD-goal O2 saturation 88 and above. ? ?Acute on chronic HFpEF: Volume status better-still with minimal lower extremity edema-on IV Lasix/Diamox. ? ?AKI on CKD stage IIIa: AKI hemodynamically mediated-creatinine has now normalized. ? ?Shock liver: LFTs have improved-follow periodically. ? ?CAD s/p PCI to RCA in 2012: No anginal symptoms-continue ASA.  Reviewed last cardiology-last [facility] 10/07/2020-showed stable CAD-recommendations were for medical management.  Resume statin/beta-blocker once LFTs and BP permit. ? ?HTN: BP stable-no longer on midodrine-resume antihypertensives when able. ? ?Anxiety/depression: Appears relatively stable-since developed lethargy with benzodiazepines-Xanax dose lowered and changed to as needed.  Remains  awake/alert on this regimen-continue Lexapro   ? ?Restless leg syndrome: On Mirapex. ? ?History of breast cancer: Stable for outpatient follow-up with PCP. ? ?COPD: Not in exacerbation-continue bronchodilators. ? ?Probable OSA: Given  hypercarbia-PCCM recommending BiPAP nightly. ? ?Bilateral thyroid nodule: Seen incidentally on CT chest-stable for outpatient follow-up with PCP.  TSH stable. ? ?Pressure Ulcer: ?Pressure Injury 11/16/21 Buttocks Left;Medial Stage 2 -  Partial thickness loss of dermis presenting as a shallow open injury with a red, pink wound bed without slough. Red open area (Active)  ?11/16/21 1030  ?Location: Buttocks  ?Location Orientation: Left;Medial  ?Staging: Stage 2 -  Partial thickness loss of dermis presenting as a shallow open injury with a red, pink wound bed without slough.  ?Wound Description (Comments): Red open area  ?Present on Admission: Yes  ?Dressing Type Foam - Lift dressing to assess site every shift 11/25/21 0900  ? ?Obesity: ?Estimated body mass index is 36.1 kg/m? as calculated from the following: ?  Height as of this encounter: _0  (1.651 m). ?  Weight as of this encounter: 98.4 kg.  ? ?Code status: ?  Code Status: Prior  ? ?DVT Prophylaxis: ?enoxaparin (LOVENOX) injection 40 mg Start: 11/20/21 2200 ?SCDs Start: 11/16/21 1312 ?  ?Family Communication: Son in law-Brandon [phone]-left VM on 5/3 ? ? ?Disposition Plan: ?Status is: Inpatient ?Remains inpatient appropriate because: Resolving hypoxia-still on 4-6 L of oxygen-not yet stable for discharge.  . ?  ?Planned Discharge Destination: SNF ? ? ?Diet: ?Diet Order   ? ?       ?  Diet regular Room service appropriate? Yes; Fluid consistency: Thin  Diet effective now       ?  ? ?  ?  ? ?  ?  ? ? ?Antimicrobial agents: ?Anti-infectives (From admission, onward)  ? ? Start     Dose/Rate Route Frequency Ordered Stop  ? 11/21/21 1000  cefTRIAXone (ROCEPHIN) 2 g in sodium chloride 0.9 % 100 mL IVPB       ? 2 g ?200 mL/hr over 30 Minutes Intravenous Every 24 hours 11/20/21 1128 11/22/21 0813  ? 11/18/21 1000  cefTRIAXone (ROCEPHIN) 2 g in sodium chloride 0.9 % 100 mL IVPB       ? 2 g ?200 mL/hr over 30 Minutes Intravenous Every 24 hours 11/18/21 0906  11/20/21 1357  ? 11/16/21 2200  ceFEPIme (MAXIPIME) 2 g in sodium chloride 0.9 % 100 mL IVPB  Status:  Discontinued       ? 2 g ?200 mL/hr over 30 Minutes Intravenous Every 24 hours 11/16/21 0627 11/18/21 0906  ? 11/16/21 1000  vancomycin (VANCOREADY) IVPB 1250 mg/250 mL  Status:  Discontinued       ? 1,250 mg ?166.7 mL/hr over 90 Minutes Intravenous Every 48 hours 11/16/21 0627 11/17/21 0844  ? 11/16/21 0015  ceFEPIme (MAXIPIME) 2 g in sodium chloride 0.9 % 100 mL IVPB       ? 2 g ?200 mL/hr over 30 Minutes Intravenous  Once 11/16/21 0001 11/16/21 0054  ? 11/16/21 0015  metroNIDAZOLE (FLAGYL) IVPB 500 mg       ? 500 mg ?100 mL/hr over 60 Minutes Intravenous  Once 11/16/21 0001 11/16/21 0147  ? 11/16/21 0015  vancomycin (VANCOCIN) IVPB 1000 mg/200 mL premix       ? 1,000 mg ?200 mL/hr over 60 Minutes Intravenous  Once 11/16/21 0001 11/16/21 0246  ? ?  ? ? ? ?MEDICATIONS: ?Scheduled Meds: ? acetaZOLAMIDE  250 mg Oral BID  ?  arformoterol  15 mcg Nebulization BID  ? aspirin  81 mg Oral Daily  ? budesonide (PULMICORT) nebulizer solution  0.5 mg Nebulization BID  ? chlorhexidine gluconate (MEDLINE KIT)  15 mL Mouth Rinse BID  ? docusate  100 mg Oral BID  ? enoxaparin (LOVENOX) injection  40 mg Subcutaneous Q24H  ? escitalopram  20 mg Oral Daily  ? furosemide  40 mg Intravenous Q12H  ? Gerhardt's butt cream   Topical BID  ? guaiFENesin  600 mg Oral BID  ? mouth rinse  15 mL Mouth Rinse BID  ? polyethylene glycol  17 g Oral Daily  ? potassium chloride  40 mEq Oral Daily  ? pramipexole  1 mg Oral BID  ? sodium chloride flush  10-40 mL Intracatheter Q12H  ? umeclidinium bromide  1 puff Inhalation Daily  ? ?Continuous Infusions: ? sodium chloride Stopped (11/18/21 0436)  ? ?PRN Meds:.albuterol, ALPRAZolam, nitroGLYCERIN, sodium chloride, sodium chloride flush ? ? ?I have personally reviewed following labs and imaging studies ? ?LABORATORY DATA: ?CBC: ?Recent Labs  ?Lab 11/20/21 ?0157 11/21/21 ?2863 11/22/21 ?0112  ?WBC 18.6*  16.3* 13.4*  ?HGB 10.9* 10.6* 10.8*  ?HCT 39.8 37.7 37.5  ?MCV 86.5 86.3 84.8  ?PLT 107* 108* 127*  ? ? ? ?Basic Metabolic Panel: ?Recent Labs  ?Lab 11/22/21 ?0112 11/23/21 ?0456 11/24/21 ?8177 11/25/21 ?0148 0

## 2021-11-27 DIAGNOSIS — J9601 Acute respiratory failure with hypoxia: Secondary | ICD-10-CM | POA: Diagnosis not present

## 2021-11-27 DIAGNOSIS — I2510 Atherosclerotic heart disease of native coronary artery without angina pectoris: Secondary | ICD-10-CM | POA: Diagnosis not present

## 2021-11-27 DIAGNOSIS — I10 Essential (primary) hypertension: Secondary | ICD-10-CM | POA: Diagnosis not present

## 2021-11-27 DIAGNOSIS — R2681 Unsteadiness on feet: Secondary | ICD-10-CM | POA: Diagnosis not present

## 2021-11-27 DIAGNOSIS — Z7401 Bed confinement status: Secondary | ICD-10-CM | POA: Diagnosis not present

## 2021-11-27 DIAGNOSIS — J449 Chronic obstructive pulmonary disease, unspecified: Secondary | ICD-10-CM

## 2021-11-27 DIAGNOSIS — R0902 Hypoxemia: Secondary | ICD-10-CM | POA: Diagnosis not present

## 2021-11-27 DIAGNOSIS — I959 Hypotension, unspecified: Secondary | ICD-10-CM | POA: Diagnosis not present

## 2021-11-27 DIAGNOSIS — Z7901 Long term (current) use of anticoagulants: Secondary | ICD-10-CM | POA: Diagnosis not present

## 2021-11-27 DIAGNOSIS — J9811 Atelectasis: Secondary | ICD-10-CM | POA: Diagnosis not present

## 2021-11-27 DIAGNOSIS — R0602 Shortness of breath: Secondary | ICD-10-CM | POA: Diagnosis not present

## 2021-11-27 DIAGNOSIS — L89322 Pressure ulcer of left buttock, stage 2: Secondary | ICD-10-CM | POA: Diagnosis not present

## 2021-11-27 DIAGNOSIS — R062 Wheezing: Secondary | ICD-10-CM | POA: Diagnosis not present

## 2021-11-27 DIAGNOSIS — Z853 Personal history of malignant neoplasm of breast: Secondary | ICD-10-CM | POA: Diagnosis not present

## 2021-11-27 DIAGNOSIS — Z7982 Long term (current) use of aspirin: Secondary | ICD-10-CM | POA: Diagnosis not present

## 2021-11-27 DIAGNOSIS — Z741 Need for assistance with personal care: Secondary | ICD-10-CM | POA: Diagnosis not present

## 2021-11-27 DIAGNOSIS — G9341 Metabolic encephalopathy: Secondary | ICD-10-CM | POA: Diagnosis not present

## 2021-11-27 DIAGNOSIS — I130 Hypertensive heart and chronic kidney disease with heart failure and stage 1 through stage 4 chronic kidney disease, or unspecified chronic kidney disease: Secondary | ICD-10-CM | POA: Diagnosis not present

## 2021-11-27 DIAGNOSIS — I5030 Unspecified diastolic (congestive) heart failure: Secondary | ICD-10-CM | POA: Diagnosis not present

## 2021-11-27 DIAGNOSIS — Z720 Tobacco use: Secondary | ICD-10-CM | POA: Diagnosis not present

## 2021-11-27 DIAGNOSIS — J441 Chronic obstructive pulmonary disease with (acute) exacerbation: Secondary | ICD-10-CM | POA: Diagnosis not present

## 2021-11-27 DIAGNOSIS — J80 Acute respiratory distress syndrome: Secondary | ICD-10-CM | POA: Diagnosis not present

## 2021-11-27 DIAGNOSIS — E1159 Type 2 diabetes mellitus with other circulatory complications: Secondary | ICD-10-CM | POA: Diagnosis not present

## 2021-11-27 DIAGNOSIS — R6521 Severe sepsis with septic shock: Secondary | ICD-10-CM | POA: Diagnosis not present

## 2021-11-27 DIAGNOSIS — Z20822 Contact with and (suspected) exposure to covid-19: Secondary | ICD-10-CM | POA: Diagnosis not present

## 2021-11-27 DIAGNOSIS — R9431 Abnormal electrocardiogram [ECG] [EKG]: Secondary | ICD-10-CM | POA: Diagnosis not present

## 2021-11-27 DIAGNOSIS — Z79899 Other long term (current) drug therapy: Secondary | ICD-10-CM | POA: Diagnosis not present

## 2021-11-27 DIAGNOSIS — E1122 Type 2 diabetes mellitus with diabetic chronic kidney disease: Secondary | ICD-10-CM | POA: Diagnosis not present

## 2021-11-27 DIAGNOSIS — E041 Nontoxic single thyroid nodule: Secondary | ICD-10-CM | POA: Diagnosis not present

## 2021-11-27 DIAGNOSIS — N182 Chronic kidney disease, stage 2 (mild): Secondary | ICD-10-CM | POA: Diagnosis not present

## 2021-11-27 DIAGNOSIS — I509 Heart failure, unspecified: Secondary | ICD-10-CM | POA: Diagnosis not present

## 2021-11-27 DIAGNOSIS — J189 Pneumonia, unspecified organism: Secondary | ICD-10-CM | POA: Diagnosis not present

## 2021-11-27 DIAGNOSIS — M2550 Pain in unspecified joint: Secondary | ICD-10-CM | POA: Diagnosis not present

## 2021-11-27 DIAGNOSIS — G2581 Restless legs syndrome: Secondary | ICD-10-CM | POA: Diagnosis not present

## 2021-11-27 DIAGNOSIS — M6281 Muscle weakness (generalized): Secondary | ICD-10-CM | POA: Diagnosis not present

## 2021-11-27 DIAGNOSIS — J9621 Acute and chronic respiratory failure with hypoxia: Secondary | ICD-10-CM | POA: Diagnosis not present

## 2021-11-27 DIAGNOSIS — J432 Centrilobular emphysema: Secondary | ICD-10-CM | POA: Diagnosis not present

## 2021-11-27 DIAGNOSIS — E119 Type 2 diabetes mellitus without complications: Secondary | ICD-10-CM | POA: Diagnosis not present

## 2021-11-27 DIAGNOSIS — Z992 Dependence on renal dialysis: Secondary | ICD-10-CM | POA: Diagnosis not present

## 2021-11-27 DIAGNOSIS — A419 Sepsis, unspecified organism: Secondary | ICD-10-CM | POA: Diagnosis not present

## 2021-11-27 DIAGNOSIS — J9622 Acute and chronic respiratory failure with hypercapnia: Secondary | ICD-10-CM | POA: Diagnosis not present

## 2021-11-27 DIAGNOSIS — Z7984 Long term (current) use of oral hypoglycemic drugs: Secondary | ICD-10-CM | POA: Diagnosis not present

## 2021-11-27 DIAGNOSIS — R41841 Cognitive communication deficit: Secondary | ICD-10-CM | POA: Diagnosis not present

## 2021-11-27 DIAGNOSIS — F411 Generalized anxiety disorder: Secondary | ICD-10-CM | POA: Diagnosis not present

## 2021-11-27 DIAGNOSIS — F3342 Major depressive disorder, recurrent, in full remission: Secondary | ICD-10-CM | POA: Diagnosis not present

## 2021-11-27 DIAGNOSIS — F17210 Nicotine dependence, cigarettes, uncomplicated: Secondary | ICD-10-CM | POA: Diagnosis not present

## 2021-11-27 DIAGNOSIS — N1831 Chronic kidney disease, stage 3a: Secondary | ICD-10-CM | POA: Diagnosis not present

## 2021-11-27 DIAGNOSIS — N179 Acute kidney failure, unspecified: Secondary | ICD-10-CM | POA: Diagnosis not present

## 2021-11-27 LAB — GLUCOSE, CAPILLARY
Glucose-Capillary: 115 mg/dL — ABNORMAL HIGH (ref 70–99)
Glucose-Capillary: 110 mg/dL — ABNORMAL HIGH (ref 70–99)

## 2021-11-27 LAB — BASIC METABOLIC PANEL
Anion gap: 7 (ref 5–15)
BUN: 11 mg/dL (ref 8–23)
CO2: 36 mmol/L — ABNORMAL HIGH (ref 22–32)
Calcium: 8.9 mg/dL (ref 8.9–10.3)
Chloride: 97 mmol/L — ABNORMAL LOW (ref 98–111)
Creatinine, Ser: 1.02 mg/dL — ABNORMAL HIGH (ref 0.44–1.00)
GFR, Estimated: >60 mL/min (ref >60)
Glucose, Bld: 145 mg/dL — ABNORMAL HIGH (ref 70–99)
Potassium: 3.5 mmol/L (ref 3.5–5.1)
Sodium: 140 mmol/L (ref 135–145)

## 2021-11-27 MED ORDER — ALPRAZOLAM 0.25 MG PO TABS: 0.2500 mg | ORAL_TABLET | Freq: Three times a day (TID) | ORAL | 0 refills | Status: DC | PRN

## 2021-11-27 MED ORDER — UMECLIDINIUM BROMIDE 62.5 MCG/ACT IN AEPB
1.0000 | INHALATION_SPRAY | Freq: Every day | RESPIRATORY_TRACT | Status: DC
Start: 2021-11-28 — End: 2021-12-17

## 2021-11-27 MED ORDER — NITROGLYCERIN 0.4 MG SL SUBL: 0.4000 mg | SUBLINGUAL_TABLET | SUBLINGUAL | 12 refills | Status: DC | PRN

## 2021-11-27 MED ORDER — IPRATROPIUM-ALBUTEROL 0.5-2.5 (3) MG/3ML IN SOLN: 3.0000 mL | RESPIRATORY_TRACT | Status: DC | PRN

## 2021-11-27 MED ORDER — POTASSIUM CHLORIDE CRYS ER 20 MEQ PO TBCR: 20.0000 meq | EXTENDED_RELEASE_TABLET | Freq: Every day | ORAL | Status: DC

## 2021-11-27 MED ORDER — METOPROLOL TARTRATE 25 MG PO TABS: 25.0000 mg | ORAL_TABLET | Freq: Two times a day (BID) | ORAL | 11 refills | Status: DC

## 2021-11-27 MED ORDER — FUROSEMIDE 40 MG PO TABS: 40.0000 mg | ORAL_TABLET | Freq: Every day | ORAL | 1 refills | Status: DC

## 2021-11-27 NOTE — Discharge Summary (Signed)
? ?PATIENT DETAILS ?Name: [name] ?Age: 66 y.o. ?Sex: female ?Date of Birth: 1956/03/13 ?MRN: [id number]. ?Admitting Physician: Lanier Clam, MD ?RDE:YCXKGY, [name], FNP ? ?Admit Date: 11/15/2021 ?Discharge date: 11/27/2021 ? ?Recommendations for Outpatient Follow-up:  ?Follow up with PCP in 1-2 weeks ?Please obtain CMP/CBC in one week ?Please ensure outpatient follow-up with pulmonology. ?Incidental finding-bilateral thyroid nodules-defer further work-up to PCP. ? ?Admitted From:  ?Home ? ?Disposition: ?Skilled nursing facility ?  ?Discharge Condition: ?good ? ?CODE STATUS: ?  Code Status: Prior  ? ?Diet recommendation:  ?Diet Order   ? ?       ?  Diet - low sodium heart healthy       ?  ?  Diet regular Room service appropriate? Yes; Fluid consistency: Thin  Diet effective now       ?  ? ?  ?  ? ?  ?  ? ?Brief Summary: ?Patient is a 66 y.o.  female with history of CAD s/p PCI, HTN, HLD COPD, CKD stage IIIa-who presented to the hospital with altered mental status-felt to have acute metabolic encephalopathy-septic shock due to PNA.  Intubated and admitted by PCCM-upon stability-transfer to [facility]. ?  ?Significant events: ?4/22>> presented to [facility] ED with encephalopathy, shock-found to have PNA-intubated and transferred to [facility]. ?4/24>> extubated ?4/26>> transfer to [facility]. ?  ?Significant studies: ?4/22>> CT head: No acute intracranial abnormality. ?4/23>> CXR: Increased opacity in left lower lobe. ?4/23>> CT chest/abdomen/pelvis: Extensive left-sided PNA, thyroid nodules. ?4/23>> CT head: No acute intracranial abnormality. ?  ?Significant microbiology data: ?4/22>> influenza/COVID PCR: Negative ?4/22>> blood culture: Negative ?4/23>> blood culture: Negative ?4/23>> urine culture: E. 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 ?5/02>> CTA chest: No PE ?  ?Procedures: ?4/22-4/24>> ETT ?  ?Consults: ?None  ? ?Brief Hospital Course: ?Septic shock:  Due to PNA-sepsis physiology has improved-has completed a course of 7 days of antimicrobial therapy on 4/28.  Remains afebrile-and stable off antimicrobial therapy. ?  ?Acute metabolic encephalopathy: Multifactorial in the setting of sepsis-with some contributions from benzos, mild hypercarbia.  Encephalopathy has resolved-CT head x2 negative for acute abnormality.  ?  ?Acute hypoxic respiratory failure: Likely due to PNA-and HFpEF exacerbation superimposed on underlying COPD.  Much better-at 1 point postextubation she was on anywhere from 10-12 L of HFNC-she is now down to 3 L of oxygen this morning.  Continue to use oxygen 24/7 and follow-up with her primary pulmonologist. ? ?Acute on chronic HFpEF: Volume status better-still with minimal lower extremity edema-was on IV furosemide-we will transition to oral diuretic regimen on discharge. ?  ?AKI on CKD stage IIIa: AKI hemodynamically mediated-creatinine has now normalized. ? ?Shock liver: LFTs have improved-follow periodically. ? ?CAD s/p PCI to RCA in 2012: No anginal symptoms-continue ASA.  Reviewed last cardiology-last LHC 10/07/2020-showed stable CAD-recommendations were for medical management.  Resume statin/beta-blocker once LFTs and BP permit. ?  ?HTN: BP stable-no longer on midodrine-since BP stable-we will resume low-dose beta-blocker on discharge. ?  ?Anxiety/depression: Appears relatively stable-since developed lethargy with benzodiazepines-Xanax dose lowered and changed to as needed.  Remains awake/alert on this regimen-continue Lexapro   ?  ?Restless leg syndrome: On Mirapex. ?  ?History of breast cancer: Stable for outpatient follow-up with PCP. ?  ?COPD: Not in exacerbation-continue bronchodilators. ?  ?Probable OSA: Given hypercarbia-PCCM recommending BiPAP nightly. ?  ?Bilateral thyroid nodule: Seen incidentally on CT chest-stable for outpatient follow-up with PCP.  TSH stable. ? ?Pressure Ulcer: ?Pressure Injury 11/16/21 Buttocks  Left;Medial Stage  2 -  Partial thickness loss of dermis presenting as a shallow open injury with a red, pink wound bed without slough. Red open area (Active)  ?11/16/21 1030  ?Location: Buttocks  ?Location Orientation: Left;Medial  ?Staging: Stage 2 -  Partial thickness loss of dermis presenting as a shallow open injury with a red, pink wound bed without slough.  ?Wound Description (Comments): Red open area  ?Present on Admission: Yes  ?Dressing Type Foam - Lift dressing to assess site every shift 11/27/21 0800  ? ?Obesity: ?Estimated body mass index is 34.56 kg/m? as calculated from the following: ?  Height as of this encounter: '5\' 5"'$  (1.651 m). ?  Weight as of this encounter: 94.2 kg.  ? ?RN pressure injury documentation: ?Pressure Injury 11/16/21 Buttocks Left;Medial Stage 2 -  Partial thickness loss of dermis presenting as a shallow open injury with a red, pink wound bed without slough. Red open area (Active)  ?11/16/21 1030  ?Location: Buttocks  ?Location Orientation: Left;Medial  ?Staging: Stage 2 -  Partial thickness loss of dermis presenting as a shallow open injury with a red, pink wound bed without slough.  ?Wound Description (Comments): Red open area  ?Present on Admission: Yes  ?Dressing Type Foam - Lift dressing to assess site every shift 11/27/21 0800  ? ? ?Discharge Diagnoses:  ?Principal Problem: ?  Septic shock ([place]) ?Active Problems: ?  Pressure injury of skin ? ? ?Discharge Instructions: ? ?Activity:  ?As tolerated ? ?Check CBGs before meals and at bedtime. ? ?Discharge Instructions   ? ? Diet - low sodium heart healthy   Complete by: As directed ?  ? Discharge instructions   Complete by: As directed ?  ? Follow with Primary MD  Hassell Done [name], FNP in 1-2 weeks ? ?Follow-up with your primary pulmonologist in 2 weeks. ? ?Use BiPAP/CPAP nightly. ? ?Oxygen 24/7 until seen by primary pulmonologist/primary care practitioner. ? ?Incidental finding-bilateral thyroid nodules-please have your primary care  practitioner initiate further work-up.  In some cases these can turn malignant. ? ?Please get a complete blood count and chemistry panel checked by your Primary MD at your next visit, and again as instructed by your Primary MD. ? ?Get Medicines reviewed and adjusted: ?Please take all your medications with you for your next visit with your Primary MD ? ?Laboratory/radiological data: ?Please request your Primary MD to go over all hospital tests and procedure/radiological results at the follow up, please ask your Primary MD to get all Hospital records sent to his/her office. ? ?In some cases, they will be blood work, cultures and biopsy results pending at the time of your discharge. Please request that your primary care M.D. follows up on these results. ? ?Also Note the following: ?If you experience worsening of your admission symptoms, develop shortness of breath, life threatening emergency, suicidal or homicidal thoughts you must seek medical attention immediately by calling 911 or calling your MD immediately  if symptoms less severe. ? ?You must read complete instructions/literature along with all the possible adverse reactions/side effects for all the Medicines you take and that have been prescribed to you. Take any new Medicines after you have completely understood and accpet all the possible adverse reactions/side effects.  ? ?Do not drive when taking Pain medications or sleeping medications (Benzodaizepines) ? ?Do not take more than prescribed Pain, Sleep and Anxiety Medications. It is not advisable to combine anxiety,sleep and pain medications without talking with your primary care practitioner ? ?Special Instructions: If you  have smoked or chewed Tobacco  in the last 2 yrs please stop smoking, stop any regular Alcohol  and or any Recreational drug use. ? ?Wear Seat belts while driving. ? ?Please note: ?You were cared for by a hospitalist during your hospital stay. Once you are discharged, your primary care  physician will handle any further medical issues. Please note that NO REFILLS for any discharge medications will be authorized once you are discharged, as it is imperative that you return to your primary care phys

## 2021-11-27 NOTE — Progress Notes (Signed)
Speech Language Pathology Treatment: Dysphagia  ?Patient Details ?Name: [name] ?MRN: [id number] ?DOB: 03/12/1956 ?Today's Date: 11/27/2021 ?Time: 2549-8264 ?SLP Time Calculation (min) (ACUTE ONLY): 23 min ? ?Assessment / Plan / Recommendation ?Clinical Impression ? Skilled SlP follow up to assure po tolerance and final education completed re: airway protection with po. Pt fully alert - willing to participate in 3 ounce Yale water screen, which she easily passed.  She admits to occasional coughing with intake- liquids more than foods - but does not recall specific information re: occurences.  Reviewed aspiration precautions using teach back.  Tan coating noted in tongue creases - - which pt states is due to her inhaler.  Advised her to notify md about this and to swish and spit with water after all breathing treatments.  Using teach back, all information reviewed for precautions - and pt reports voice and swallow is at baseline. ? ?  ?HPI HPI: Patient is a 66 year old female PMH of anxiety/depression, CAD, HTN, HLD, CKD, COPD, breast cancer presents to [facility] ED on 4/22 with AMS. Pt fouind on sofa not responsive and covered in fecces. Found to have septic shock, UA with small leukocytes, LLL opacity and acute respiratory failure requiring intubation 4/23-4/24.  Pt admits to h/o GERD but does not take a PPI - She denies worsening dysphagia since hospital admit. ?  ?   ?SLP Plan ? All goals met ? ?  ?  ?Recommendations for follow up therapy are one component of a multi-disciplinary discharge planning process, led by the attending physician.  Recommendations may be updated based on patient status, additional functional criteria and insurance authorization. ?  ? ?Recommendations  ?Diet recommendations: Regular;Thin liquid ?Liquids provided via: Cup;Straw ?Medication Administration: Other (Comment) (as tolerated) ?Supervision: Patient able to self feed ?Compensations: Slow rate;Small sips/bites ?Postural Changes and/or  Swallow Maneuvers: Seated upright 90 degrees  ?   ?    ?   ? ? ? ? Oral Care Recommendations: Oral care BID ?Follow Up Recommendations: No SLP follow up ?SLP Visit Diagnosis: Dysphagia, unspecified (R13.10) ?Plan: All goals met ? ? ? ? ?  ?  ?Kathleen Lime, MS CCC SLP ?Acute Rehab Services ?Office [phone] ?Pager [phone] ? ? ?Macario Golds ? ?11/27/2021, 9:48 AM ?

## 2021-11-27 NOTE — TOC Transition Note (Signed)
Transition of Care (TOC) - CM/SW Discharge Note ? ? ?Patient Details  ?Name: [name] ?MRN: [id number] ?Date of Birth: 09-20-1955 ? ?Transition of Care (TOC) CM/SW Contact:  ?Benard Halsted, [name] ?Phone Number: ?11/27/2021, 2:47 PM ? ? ?Clinical Narrative:    ?Patient will DC to: [facility] ?Anticipated DC date: 11/27/21 ?Family notified: Son in law ?Transport by: Corey Harold ? ? ?Per MD patient ready for DC to [facility] RN to call report prior to discharge [phone]). RN, patient, patient's family, and facility notified of DC. Discharge Summary and FL2 sent to facility. DC packet on chart including signed script. Ambulance transport requested for patient.  ? ?CSW will sign off for now as social work intervention is no longer needed. Please consult [place] again if new needs arise. ? ? ? ? ?Final next level of care: [place] ?Barriers to Discharge: Barriers Resolved ? ? ?Patient Goals and CMS Choice ?Patient states their goals for this hospitalization and ongoing recovery are:: Rehab ?CMS Medicare.gov Compare Post Acute Care list provided to:: Patient Represenative (must comment) ?Choice offered to / list presented to : Adult Children ? ?Discharge Placement ?  ?Existing PASRR number confirmed : 11/27/21          ?Patient chooses bed at: [facility] ?Patient to be transferred to facility by: [facility] ?Name of family member notified: Son in law ?Patient and family notified of of transfer: 11/27/21 ? ?Discharge Plan and Services ?In-house Referral: Clinical Social Work ?  ?Post Acute Care Choice: IP Rehab, [place]          ?  ?  ?  ?  ?  ?  ?  ?  ?  ?  ? ?Social Determinants of Health (SDOH) Interventions ?  ? ? ?Readmission Risk Interventions ?   ? View : No data to display.  ?  ?  ?  ? ? ? ? ? ?

## 2021-11-27 NOTE — Plan of Care (Signed)
?  Problem: Education: ?Goal: Knowledge of General Education information will improve ?Description: Including pain rating scale, medication(s)/side effects and non-pharmacologic comfort measures ?Outcome: Completed/Met ?  ?Problem: Health Behavior/Discharge Planning: ?Goal: Ability to manage health-related needs will improve ?Outcome: Completed/Met ?  ?Problem: Clinical Measurements: ?Goal: Ability to maintain clinical measurements within normal limits will improve ?Outcome: Completed/Met ?Goal: Will remain free from infection ?Outcome: Completed/Met ?Goal: Diagnostic test results will improve ?Outcome: Completed/Met ?Goal: Respiratory complications will improve ?Outcome: Completed/Met ?Goal: Cardiovascular complication will be avoided ?Outcome: Completed/Met ?  ?Problem: Activity: ?Goal: Risk for activity intolerance will decrease ?Outcome: Completed/Met ?  ?Problem: Nutrition: ?Goal: Adequate nutrition will be maintained ?Outcome: Completed/Met ?  ?Problem: Coping: ?Goal: Level of anxiety will decrease ?Outcome: Completed/Met ?  ?Problem: Elimination: ?Goal: Will not experience complications related to bowel motility ?Outcome: Completed/Met ?Goal: Will not experience complications related to urinary retention ?Outcome: Completed/Met ?  ?Problem: Pain Managment: ?Goal: General experience of comfort will improve ?Outcome: Completed/Met ?  ?Problem: Safety: ?Goal: Ability to remain free from injury will improve ?Outcome: Completed/Met ?  ?Problem: Skin Integrity: ?Goal: Risk for impaired skin integrity will decrease ?Outcome: Completed/Met ?  ?Problem: Activity: ?Goal: Ability to tolerate increased activity will improve ?Outcome: Completed/Met ?  ?Problem: Respiratory: ?Goal: Ability to maintain a clear airway and adequate ventilation will improve ?Outcome: Completed/Met ?  ?Problem: Role Relationship: ?Goal: Method of communication will improve ?Outcome: Completed/Met ?  ?

## 2021-11-27 NOTE — TOC Progression Note (Addendum)
Transition of Care (TOC) - Progression Note  ? ? ?Patient Details  ?Name: ANGELISA WINTHROP ?MRN: [id number] ?Date of Birth: 1955/10/20 ? ?Transition of Care (TOC) CM/SW Contact  ?Benard Halsted, [name] ?Phone Number: ?11/27/2021, 8:46 AM ? ?Clinical Narrative:    ?8:46am-[facility] notified CSW that [facility] to Peer has been requested by [facility]. CSW notified MD and provided [facility] with his contact info.  ? ?1pm-Insurance approval received. ? ? ?  ?Barriers to Discharge: Continued Medical Work up, Ship broker, SNF Pending bed offer ? ?Expected Discharge Plan and Services ?  ?In-house Referral: Clinical Social Work ?  ?Post Acute Care Choice: IP Rehab, [place] ?Living arrangements for the past 2 months: [place] ?                ?  ?  ?  ?  ?  ?  ?  ?  ?  ?  ? ? ?Social Determinants of Health (SDOH) Interventions ?  ? ?Readmission Risk Interventions ?   ? View : No data to display.  ?  ?  ?  ? ? ?

## 2021-11-27 NOTE — Progress Notes (Signed)
Physical Therapy Treatment ?Patient Details ?Name: [name] ?MRN: [id number] ?DOB: 09/26/1955 ?Today's Date: 11/27/2021 ? ? ?History of Present Illness Pt is 66 yo female found AMS unresponsive 4/23 in feces with septic shock (+) UA intubated 4/23-4/24.  Pt with PNE. PMH anxiety, breast CA, CAD, nstemi 2008, COPD depression, HLD, HTN MI, stable angina, stress incontinence, lumpectomy 2012, R elbow surg 2008 ? ?  ?PT Comments  ? ? Patient reporting generalized fatigue but willing to work with PT. Session focused on ambulation, balance training, strengthening, and pulmonary status. Continues to make steady progress, however is not yet at a level to be safe home alone.  ?   ?Recommendations for follow up therapy are one component of a multi-disciplinary discharge planning process, led by the attending physician.  Recommendations may be updated based on patient status, additional functional criteria and insurance authorization. ? ?Follow Up Recommendations ? Skilled nursing-short term rehab (<3 hours/day) ?  ?  ?Assistance Recommended at Discharge Frequent or constant Supervision/Assistance  ?Patient can return home with the following A little help with walking and/or transfers;Assistance with cooking/housework;Direct supervision/assist for medications management;Direct supervision/assist for financial management;Help with stairs or ramp for entrance;A little help with bathing/dressing/bathroom ?  ?Equipment Recommendations ? Rolling walker (2 wheels);Wheelchair cushion (measurements PT);Wheelchair (measurements PT);BSC/3in1  ?  ?Recommendations for Other Services Rehab consult ? ? ?  ?Precautions / Restrictions Precautions ?Precautions: Fall ?Precaution Comments: fall risk watch BP, 3L ?Restrictions ?Weight Bearing Restrictions: No  ?  ? ?Mobility ? Bed Mobility ?Overal bed mobility: Modified Independent ?Bed Mobility: Supine to Sit, Sit to Supine ?  ?  ?Supine to sit: Modified independent (Device/Increase  time) ?Sit to supine: Modified independent (Device/Increase time) ?  ?General bed mobility comments: HOB up ?  ? ?Transfers ?Overall transfer level: Needs assistance ?Equipment used: None, Rolling walker (2 wheels) ?Transfers: Sit to/from Stand, Bed to chair/wheelchair/BSC ?Sit to Stand: Modified independent (Device/Increase time) ?  ?Step pivot transfers: Modified independent (Device/Increase time) ?  ?  ?  ?General transfer comment: bed to [facility] to recliner back to bed without device; sit to stand prior to walking and standing ex's with RW ?  ? ?Ambulation/Gait ?Ambulation/Gait assistance: Min guard ?Gait Distance (Feet): 50 Feet ?Assistive device: Rolling walker (2 wheels) ?Gait Pattern/deviations: Decreased stride length, Trunk flexed, Step-through pattern ?Gait velocity: decreased ?  ?  ?General Gait Details: on 3L with sats to 88% with seated recovery to 90% in 2 minutes with pursed lip breathing (required cues) ? ? ?Stairs ?  ?  ?  ?  ?  ? ? ?Wheelchair Mobility ?  ? ?Modified Rankin (Stroke Patients Only) ?  ? ? ?  ?Balance Overall balance assessment: Needs assistance ?Sitting-balance support: Bilateral upper extremity supported, Feet supported ?Sitting balance-Leahy Scale: Good ?  ?  ?Standing balance support: Bilateral upper extremity supported, During functional activity, Reliant on assistive device for balance ?Standing balance-Leahy Scale: Fair ?Standing balance comment: Requiring RW min A  dynamic ?  ?  ?  ?  ?  ?  ?  ?  ?  ?  ?  ?  ? ?  ?Cognition Arousal/Alertness: Awake/alert ?Behavior During Therapy: [facility] for tasks assessed/performed ?Overall Cognitive Status: Within Functional Limits for tasks assessed ?  ?  ?  ?  ?  ?  ?  ?  ?  ?  ?  ?  ?  ?  ?  ?  ?  ?  ?  ? ?  ?Exercises General Exercises -  Lower Extremity ?Ankle Circles/Pumps: AROM, Both, 10 reps, Seated ?Hip Flexion/Marching: AROM, Both, 10 reps, Standing (bil UE support on RW) ?Mini-Sqauts: AROM, Both, 10 reps (bil light UE support via  RW) ?Other Exercises ?Other Exercises: IS x 7 reps 600-1041m ? ?  ?General Comments   ?  ?  ? ?Pertinent Vitals/Pain Pain Assessment ?Pain Assessment: No/denies pain  ? ? ?Home Living   ?  ?  ?  ?  ?  ?  ?  ?  ?  ?   ?  ?Prior Function    ?  ?  ?   ? ?PT Goals (current goals can now be found in the care plan section) Acute Rehab PT Goals ?Patient Stated Goal: get back to dog Yoshi ?PT Goal Formulation: With patient ?Time For Goal Achievement: 12/02/21 ?Potential to Achieve Goals: Good ?Progress towards PT goals: Progressing toward goals ? ?  ?Frequency ? ? ? Min 3X/week ? ? ? ?  ?PT Plan Current plan remains appropriate  ? ? ?Co-evaluation   ?  ?  ?  ?  ? ?  ?AM-PAC PT "6 Clicks" Mobility   ?Outcome Measure ? Help needed turning from your back to your side while in a flat bed without using bedrails?: None ?Help needed moving from lying on your back to sitting on the side of a flat bed without using bedrails?: None ?Help needed moving to and from a bed to a chair (including a wheelchair)?: A Little ?Help needed standing up from a chair using your arms (e.g., wheelchair or bedside chair)?: A Little ?Help needed to walk in hospital room?: A Little ?Help needed climbing 3-5 steps with a railing? : A Lot ?6 Click Score: 19 ? ?  ?End of Session Equipment Utilized During Treatment: Oxygen ?Activity Tolerance: Patient limited by fatigue ?Patient left: with call bell/phone within reach;in bed ?Nurse Communication: Mobility status;Other (comment) ?PT Visit Diagnosis: Other abnormalities of gait and mobility (R26.89);Muscle weakness (generalized) (M62.81);History of falling (Z91.81) ?  ? ? ?Time: [address]?PT Time Calculation (min) (ACUTE ONLY): 33 min ? ?Charges:  $Gait Training: 8-22 mins ?$Therapeutic Exercise: 8-22 mins          ?          ? ? ?LArby Barrette PT ?Acute Rehabilitation Services  ?Pager [phone]?Office [phone]? ? ? ?LJeanie CooksSasser ?11/27/2021, 3:03 PM ? ?

## 2021-11-28 ENCOUNTER — Encounter: Payer: Self-pay | Admitting: Adult Health

## 2021-11-28 ENCOUNTER — Non-Acute Institutional Stay (SKILLED_NURSING_FACILITY): Payer: Medicare HMO | Admitting: Adult Health

## 2021-11-28 DIAGNOSIS — G2581 Restless legs syndrome: Secondary | ICD-10-CM | POA: Diagnosis not present

## 2021-11-28 DIAGNOSIS — E1159 Type 2 diabetes mellitus with other circulatory complications: Secondary | ICD-10-CM | POA: Diagnosis not present

## 2021-11-28 DIAGNOSIS — I2583 Coronary atherosclerosis due to lipid rich plaque: Secondary | ICD-10-CM

## 2021-11-28 DIAGNOSIS — A419 Sepsis, unspecified organism: Secondary | ICD-10-CM | POA: Diagnosis not present

## 2021-11-28 DIAGNOSIS — I2510 Atherosclerotic heart disease of native coronary artery without angina pectoris: Secondary | ICD-10-CM | POA: Diagnosis not present

## 2021-11-28 DIAGNOSIS — I10 Essential (primary) hypertension: Secondary | ICD-10-CM

## 2021-11-28 DIAGNOSIS — I509 Heart failure, unspecified: Secondary | ICD-10-CM

## 2021-11-28 DIAGNOSIS — E041 Nontoxic single thyroid nodule: Secondary | ICD-10-CM

## 2021-11-28 DIAGNOSIS — G9341 Metabolic encephalopathy: Secondary | ICD-10-CM

## 2021-11-28 DIAGNOSIS — J449 Chronic obstructive pulmonary disease, unspecified: Secondary | ICD-10-CM

## 2021-11-28 DIAGNOSIS — J9601 Acute respiratory failure with hypoxia: Secondary | ICD-10-CM | POA: Diagnosis not present

## 2021-11-28 DIAGNOSIS — R6521 Severe sepsis with septic shock: Secondary | ICD-10-CM

## 2021-11-28 MED FILL — Midazolam HCl Inj 5 MG/5ML (Base Equivalent): INTRAMUSCULAR | Qty: 2.5 | Status: AC

## 2021-11-28 NOTE — Progress Notes (Signed)
? ?Location:  [facility] ?Nursing Home Room Number: [id number] ?Place of Service:  SNF (31) ?Provider:  Durenda Age, DNP, FNP-BC ? ?Patient Care Team: ?[name], FNP as PCP - General (Family Medicine) ?[name], MD as PCP - Cardiology (Cardiology) ?Nat Christen, MD as Attending Physician (Optometry) ?Ilean China, RN as Case Manager ?Katha Cabal, [name] as Education officer, museum (Licensed Holiday representative) ?[name], MD as Consulting Physician (Cardiology) ?Steffanie Rainwater, DPM as Consulting Physician (Podiatry) ? ?Extended Emergency Contact Information ?Primary Emergency Contact: Benge,Brandon ?Mobile Phone: [phone] ?Relation: Relative ?Preferred language: English ?Interpreter needed? No ?Secondary Emergency Contact: Benge,Morgan ? [place] ?Home Phone: [phone] ?Mobile Phone: [phone] ?Relation: Daughter ? ?Code Status:  FULL ? ?Goals of care: Advanced Directive information ? ?  11/28/2021  ?  9:52 AM  ?Advanced Directives  ?Does Patient Have a Medical Advance Directive? No  ?Would patient like information on creating a medical advance directive? No - Patient declined  ? ? ? ?Chief Complaint  ?Patient presents with  ? Hospitalization Follow-up  ?  hospital follow up ?  ? ? ?HPI:  ?Pt is a 66 y.o. female who was admitted to [facility] on 11/27/21 post hospital admission 10/26/21 to 11/27/21.  She has a PMH CAD s/p PCI, hypertension, hyperlipidemia, COPD and chronic kidney disease stage IIIA.  She was brought to [facility] ED due to altered mental status and felt to have acute metabolic encephalopathy/septic shock due to pneumonia.  She was intubated on 11/15/2021 and was transferred to [facility].  CT head was negative for acute intracranial abnormality.  Chest x-ray showed increased opacity in the left lower lobe.  CT chest/abdomen/pelvis showed extensive left-sided pneumonia and thyroid  nodules.  She was extubated on 11/17/2021.  She has completed 7 days of antimicrobial therapy on 11/21/2021. Encephalopathy has resolved and discharged on O2 @ 3L/min via [place] continuously. ? ?She was seen in her room today.  No noted SOB.  She stated that she still smokes a pack of cigars/day. ? ? ?Past Medical History:  ?Diagnosis Date  ? Anxiety   ? Breast cancer ([place]) 06/30/11  ? inv ductal, ER/PR +, her-2 -  ? CAD (coronary artery disease)   ? NSTEMI 4/08: OM3 occluded (PCI unsuccessful), dRCA 95% => BMS, inf HK, EF 50%;   b. MV 11/12:  [place] ischemia => c.  [place] 11/12:  dLAD 70%, OM1 70-80%, then occluded (no change from 2008), dOM filled L->L collats, mRCA stent occluded, dRCA filled L->R collats, EF 55-65% => med Rx (consider PCI of RCA if refractory angina)  ? Carotid stenosis   ? dopplers 5/08: 0-30% bilateral; 10/12: 0-39% B/L ICA => f/u 04/2013  ? Chronic kidney disease   ? COPD (chronic obstructive pulmonary disease) ([place])   ? Depression   ? Depression with anxiety   ? GERD (gastroesophageal reflux disease)   ? OTC acid reducer prn  ? Headache(784.0)   ? sinus; occ. migraines  ? Hx of radiation therapy 08/24/11 to 10/07/11  ? L breast  ? Hyperlipidemia   ? Hypertension   ? under control; has been on med. x 4 yrs.  ? Myocardial infarction [facility])   ? Stable angina (HCC)   ? a. med Rx after cath 05/2011  ? Stress incontinence, female   ? ?Past Surgical History:  ?Procedure Laterality Date  ? AXILLARY LYMPH NODE DISSECTION  07/31/2011  ? Procedure: AXILLARY LYMPH NODE DISSECTION;  Surgeon: [name], MD;  Location: [facility];  Service: General;  Laterality: Left;  left axillary sentinal node biopsy  ? BREAST LUMPECTOMY Left 06/30/2011  ? BREAST SURGERY    ? CARDIAC CATHETERIZATION  11/02/2006; 06/03/2011  ? [facility]  ? CORONARY STENT PLACEMENT  11/02/2006  ? ELBOW SURGERY    ? right  ? LEFT HEART CATH AND CORONARY ANGIOGRAPHY N/A 10/07/2020  ? Procedure: LEFT HEART CATH AND CORONARY  ANGIOGRAPHY;  Surgeon: [name], MD;  Location: [facility];  Service: Cardiovascular;  Laterality: N/A;  ? ? ?Allergies  ?Allergen Reactions  ? Food Other (See Comments)  ?  Nutmeg= difficulty breathing  ? Nutmeg Oil (Myristica Oil) Hives  ? Other Other (See Comments)  ?  Nutmeg= difficulty breathing  ? Amlodipine Swelling  ? ? ?Outpatient Encounter Medications as of 11/28/2021  ?Medication Sig  ? albuterol (PROVENTIL HFA;VENTOLIN HFA) 108 (90 Base) MCG/ACT inhaler Inhale 2 puffs into the lungs every 6 (six) hours as needed for wheezing or shortness of breath.  ? Alcohol Swabs (B-D SINGLE USE SWABS REGULAR) PADS check blood sugars twice daily Dx E11.9  ? ALPRAZolam (XANAX) 0.25 MG tablet Take 1 tablet (0.25 mg total) by mouth 3 (three) times daily as needed for anxiety.  ? aspirin EC 81 MG tablet Take 81 mg by mouth daily. Swallow whole.  ? atorvastatin (LIPITOR) 80 MG tablet Take 1 tablet (80 mg total) by mouth at bedtime.  ? Blood Glucose Calibration (TRUE METRIX LEVEL 1) Low SOLN USE AS DIRECTED  ? Blood Glucose Monitoring Suppl (TRUE METRIX AIR GLUCOSE METER) w/Device KIT check blood sugars twice daily Dx E11.9  ? Budeson-Glycopyrrol-Formoterol (BREZTRI AEROSPHERE) 160-9-4.8 MCG/ACT AERO Inhale 2 puffs into the lungs in the morning and at bedtime.  ? Cholecalciferol (VITAMIN D3) 5000 units TABS Take 10,000 Units by mouth every evening.  ? escitalopram (LEXAPRO) 20 MG tablet Take 1 tablet (20 mg total) by mouth daily.  ? fish oil-omega-3 fatty acids 1000 MG capsule Take 1,000 mg by mouth at bedtime.  ? furosemide (LASIX) 40 MG tablet Take 1 tablet (40 mg total) by mouth daily.  ? glucose blood (TRUE METRIX BLOOD GLUCOSE TEST) test strip TEST BLOOD SUGAR TWICE DAILY  ? hydrOXYzine (VISTARIL) 25 MG capsule Take 1 capsule by mouth three times daily as needed  ? ipratropium-albuterol (DUONEB) 0.5-2.5 (3) MG/3ML SOLN Take 3 mLs by nebulization every 4 (four) hours as needed.  ? metFORMIN (GLUCOPHAGE) 500  MG tablet Take 1 tablet (500 mg total) by mouth 2 (two) times daily with a meal.  ? metoprolol tartrate (LOPRESSOR) 25 MG tablet Take 1 tablet (25 mg total) by mouth 2 (two) times daily.  ? nitroGLYCERIN (NITROSTAT) 0.4 MG SL tablet Place 1 tablet (0.4 mg total) under the tongue every 5 (five) minutes as needed for chest pain.  ? potassium chloride SA (KLOR-CON M) 20 MEQ tablet Take 1 tablet (20 mEq total) by mouth daily.  ? pramipexole (MIRAPEX) 1 MG tablet Take 1 tablet (1 mg total) by mouth in the morning and at bedtime.  ? TRUEplus Lancets 33G MISC USE TO CHECK BLOOD SUGAR TWICE DAILY  ? umeclidinium bromide (INCRUSE ELLIPTA) 62.5 MCG/ACT AEPB Inhale 1 puff into the lungs daily.  ? vitamin B-12 (CYANOCOBALAMIN) 1000 MCG tablet Take 1,000 mcg by mouth daily.  ? ?No facility-administered encounter medications on file as of 11/28/2021.  ? ? ?Review of Systems  ?Constitutional:  Negative for appetite  change, chills, fatigue and fever.  ?HENT:  Negative for congestion, hearing loss, rhinorrhea and sore throat.   ?Eyes: Negative.   ?Respiratory:  Negative for cough, shortness of breath and wheezing.   ?Cardiovascular:  Negative for chest pain, palpitations and leg swelling.  ?Gastrointestinal:  Negative for abdominal pain, constipation, diarrhea, nausea and vomiting.  ?Genitourinary:  Negative for dysuria.  ?Musculoskeletal:  Negative for arthralgias, back pain and myalgias.  ?Skin:  Negative for color change, rash and wound.  ?Neurological:  Negative for dizziness, weakness and headaches.  ?Psychiatric/Behavioral:  Negative for behavioral problems. The patient is not nervous/anxious.    ? ? ? ?Immunization History  ?Administered Date(s) Administered  ? Tdap 05/17/2001  ? ?Pertinent  Health Maintenance Due  ?Topic Date Due  ? PAP SMEAR-Modifier  05/24/2004  ? URINE MICROALBUMIN  03/26/2021  ? OPHTHALMOLOGY EXAM  05/06/2021  ? DEXA SCAN  08/22/2021  ? LIPID PANEL  01/09/2022  ? INFLUENZA VACCINE  02/24/2022  ? FOOT EXAM   04/17/2022  ? HEMOGLOBIN A1C  05/18/2022  ? MAMMOGRAM  12/19/2022  ? COLONOSCOPY (Pts 45-56yr Insurance coverage will need to be confirmed)  Discontinued  ? ? ?  11/25/2021  ?  9:00 AM 11/25/2021  ?  8:00 PM 5/3/202

## 2021-11-29 ENCOUNTER — Observation Stay (HOSPITAL_COMMUNITY)
Admission: EM | Admit: 2021-11-29 | Discharge: 2021-11-30 | Disposition: A | Discharge status: Skilled Nursing Facility | Payer: Medicare HMO | Attending: Family Medicine | Admitting: Family Medicine

## 2021-11-29 ENCOUNTER — Other Ambulatory Visit: Payer: Self-pay

## 2021-11-29 ENCOUNTER — Emergency Department (HOSPITAL_COMMUNITY): Payer: Medicare HMO

## 2021-11-29 DIAGNOSIS — Z20822 Contact with and (suspected) exposure to covid-19: Secondary | ICD-10-CM | POA: Insufficient documentation

## 2021-11-29 DIAGNOSIS — Z7901 Long term (current) use of anticoagulants: Secondary | ICD-10-CM | POA: Diagnosis not present

## 2021-11-29 DIAGNOSIS — I130 Hypertensive heart and chronic kidney disease with heart failure and stage 1 through stage 4 chronic kidney disease, or unspecified chronic kidney disease: Secondary | ICD-10-CM | POA: Insufficient documentation

## 2021-11-29 DIAGNOSIS — J9621 Acute and chronic respiratory failure with hypoxia: Secondary | ICD-10-CM

## 2021-11-29 DIAGNOSIS — Z7982 Long term (current) use of aspirin: Secondary | ICD-10-CM | POA: Diagnosis not present

## 2021-11-29 DIAGNOSIS — F17210 Nicotine dependence, cigarettes, uncomplicated: Secondary | ICD-10-CM | POA: Insufficient documentation

## 2021-11-29 DIAGNOSIS — E1122 Type 2 diabetes mellitus with diabetic chronic kidney disease: Secondary | ICD-10-CM | POA: Insufficient documentation

## 2021-11-29 DIAGNOSIS — Z853 Personal history of malignant neoplasm of breast: Secondary | ICD-10-CM | POA: Diagnosis not present

## 2021-11-29 DIAGNOSIS — G2581 Restless legs syndrome: Secondary | ICD-10-CM | POA: Diagnosis not present

## 2021-11-29 DIAGNOSIS — Z992 Dependence on renal dialysis: Secondary | ICD-10-CM | POA: Insufficient documentation

## 2021-11-29 DIAGNOSIS — Z79899 Other long term (current) drug therapy: Secondary | ICD-10-CM | POA: Insufficient documentation

## 2021-11-29 DIAGNOSIS — I2510 Atherosclerotic heart disease of native coronary artery without angina pectoris: Secondary | ICD-10-CM | POA: Diagnosis not present

## 2021-11-29 DIAGNOSIS — I5030 Unspecified diastolic (congestive) heart failure: Secondary | ICD-10-CM | POA: Diagnosis not present

## 2021-11-29 DIAGNOSIS — Z9861 Coronary angioplasty status: Secondary | ICD-10-CM

## 2021-11-29 DIAGNOSIS — I10 Essential (primary) hypertension: Secondary | ICD-10-CM | POA: Diagnosis present

## 2021-11-29 DIAGNOSIS — Z7984 Long term (current) use of oral hypoglycemic drugs: Secondary | ICD-10-CM | POA: Diagnosis not present

## 2021-11-29 DIAGNOSIS — F3342 Major depressive disorder, recurrent, in full remission: Secondary | ICD-10-CM | POA: Diagnosis not present

## 2021-11-29 DIAGNOSIS — J441 Chronic obstructive pulmonary disease with (acute) exacerbation: Principal | ICD-10-CM

## 2021-11-29 DIAGNOSIS — F32A Depression, unspecified: Secondary | ICD-10-CM | POA: Diagnosis present

## 2021-11-29 DIAGNOSIS — R0602 Shortness of breath: Secondary | ICD-10-CM | POA: Diagnosis not present

## 2021-11-29 DIAGNOSIS — F411 Generalized anxiety disorder: Secondary | ICD-10-CM | POA: Diagnosis not present

## 2021-11-29 DIAGNOSIS — J9601 Acute respiratory failure with hypoxia: Secondary | ICD-10-CM

## 2021-11-29 DIAGNOSIS — N179 Acute kidney failure, unspecified: Secondary | ICD-10-CM | POA: Diagnosis not present

## 2021-11-29 DIAGNOSIS — R9431 Abnormal electrocardiogram [ECG] [EKG]: Secondary | ICD-10-CM | POA: Diagnosis not present

## 2021-11-29 DIAGNOSIS — J9811 Atelectasis: Secondary | ICD-10-CM | POA: Diagnosis not present

## 2021-11-29 DIAGNOSIS — N1831 Chronic kidney disease, stage 3a: Secondary | ICD-10-CM | POA: Insufficient documentation

## 2021-11-29 DIAGNOSIS — J9622 Acute and chronic respiratory failure with hypercapnia: Secondary | ICD-10-CM | POA: Insufficient documentation

## 2021-11-29 LAB — RESP PANEL BY RT-PCR (FLU A&B, COVID) ARPGX2
Influenza A by PCR: NEGATIVE
Influenza B by PCR: NEGATIVE
SARS Coronavirus 2 by RT PCR: NEGATIVE

## 2021-11-29 LAB — COMPREHENSIVE METABOLIC PANEL
ALT: 41 U/L (ref 0–44)
AST: 24 U/L (ref 15–41)
Albumin: 2.7 g/dL — ABNORMAL LOW (ref 3.5–5.0)
Alkaline Phosphatase: 58 U/L (ref 38–126)
Anion gap: 11 (ref 5–15)
BUN: 13 mg/dL (ref 8–23)
CO2: 26 mmol/L (ref 22–32)
Calcium: 8.9 mg/dL (ref 8.9–10.3)
Chloride: 103 mmol/L (ref 98–111)
Creatinine, Ser: 1.14 mg/dL — ABNORMAL HIGH (ref 0.44–1.00)
GFR, Estimated: 53 mL/min — ABNORMAL LOW (ref >60)
Glucose, Bld: 164 mg/dL — ABNORMAL HIGH (ref 70–99)
Potassium: 3.9 mmol/L (ref 3.5–5.1)
Sodium: 140 mmol/L (ref 135–145)
Total Bilirubin: 0.8 mg/dL (ref 0.3–1.2)
Total Protein: 5.9 g/dL — ABNORMAL LOW (ref 6.5–8.1)

## 2021-11-29 LAB — CBC WITH DIFFERENTIAL/PLATELET
Abs Immature Granulocytes: 0.06 10*3/uL (ref 0.00–0.07)
Basophils Absolute: 0.1 10*3/uL (ref 0.0–0.1)
Basophils Relative: 0 %
Eosinophils Absolute: 0 10*3/uL (ref 0.0–0.5)
Eosinophils Relative: 0 %
HCT: 38.1 % (ref 36.0–46.0)
Hemoglobin: 10.8 g/dL — ABNORMAL LOW (ref 12.0–15.0)
Immature Granulocytes: 1 %
Lymphocytes Relative: 12 %
Lymphs Abs: 1.4 10*3/uL (ref 0.7–4.0)
MCH: 24.3 pg — ABNORMAL LOW (ref 26.0–34.0)
MCHC: 28.3 g/dL — ABNORMAL LOW (ref 30.0–36.0)
MCV: 85.6 fL (ref 80.0–100.0)
Monocytes Absolute: 0.7 10*3/uL (ref 0.1–1.0)
Monocytes Relative: 6 %
Neutro Abs: 9.5 10*3/uL — ABNORMAL HIGH (ref 1.7–7.7)
Neutrophils Relative %: 81 %
Platelets: 288 10*3/uL (ref 150–400)
RBC: 4.45 MIL/uL (ref 3.87–5.11)
RDW: 20.5 % — ABNORMAL HIGH (ref 11.5–15.5)
WBC: 11.7 10*3/uL — ABNORMAL HIGH (ref 4.0–10.5)
nRBC: 0 % (ref 0.0–0.2)

## 2021-11-29 LAB — GLUCOSE, CAPILLARY: Glucose-Capillary: 276 mg/dL — ABNORMAL HIGH (ref 70–99)

## 2021-11-29 LAB — TROPONIN I (HIGH SENSITIVITY): Troponin I (High Sensitivity): 20 ng/L — ABNORMAL HIGH (ref <18)

## 2021-11-29 LAB — BRAIN NATRIURETIC PEPTIDE: B Natriuretic Peptide: 528.3 pg/mL — ABNORMAL HIGH (ref 0.0–100.0)

## 2021-11-29 MED ORDER — ALBUTEROL SULFATE (2.5 MG/3ML) 0.083% IN NEBU
INHALATION_SOLUTION | RESPIRATORY_TRACT | Status: AC
  Filled 2021-11-29: qty 12

## 2021-11-29 MED ORDER — SODIUM CHLORIDE 0.9 % IV BOLUS
500.0000 mL | Freq: Once | INTRAVENOUS | Status: AC
  Administered 2021-11-29: 500 mL via INTRAVENOUS

## 2021-11-29 MED ORDER — ENOXAPARIN SODIUM 40 MG/0.4ML IJ SOSY
40.0000 mg | PREFILLED_SYRINGE | INTRAMUSCULAR | Status: DC
  Administered 2021-11-29: 40 mg via SUBCUTANEOUS
  Filled 2021-11-29: qty 0.4

## 2021-11-29 MED ORDER — IPRATROPIUM-ALBUTEROL 0.5-2.5 (3) MG/3ML IN SOLN
3.0000 mL | Freq: Four times a day (QID) | RESPIRATORY_TRACT | Status: DC
  Administered 2021-11-30 (×2): 3 mL via RESPIRATORY_TRACT
  Filled 2021-11-29 (×2): qty 3

## 2021-11-29 MED ORDER — ALBUTEROL SULFATE (2.5 MG/3ML) 0.083% IN NEBU
10.0000 mg | INHALATION_SOLUTION | Freq: Once | RESPIRATORY_TRACT | Status: AC
  Administered 2021-11-29: 10 mg via RESPIRATORY_TRACT
  Filled 2021-11-29: qty 12

## 2021-11-29 MED ORDER — MAGNESIUM SULFATE 2 GM/50ML IV SOLN
2.0000 g | Freq: Once | INTRAVENOUS | Status: AC
  Administered 2021-11-29: 2 g via INTRAVENOUS
  Filled 2021-11-29: qty 50

## 2021-11-29 MED ORDER — METHYLPREDNISOLONE SODIUM SUCC 125 MG IJ SOLR
60.0000 mg | Freq: Every day | INTRAMUSCULAR | Status: DC
  Administered 2021-11-30: 60 mg via INTRAVENOUS
  Filled 2021-11-29: qty 2

## 2021-11-29 MED ORDER — ALBUTEROL (5 MG/ML) CONTINUOUS INHALATION SOLN
10.0000 mg/h | INHALATION_SOLUTION | Freq: Once | RESPIRATORY_TRACT | Status: DC
  Filled 2021-11-29: qty 0.5

## 2021-11-29 NOTE — Assessment & Plan Note (Addendum)
Depression ?Continue Xanax as needed ?Continue Lexapro ?

## 2021-11-29 NOTE — ED Notes (Signed)
Pt given 4 2.5 packs of albuterol per admitting MD and RT Macy d/t pt spilling medication. ?

## 2021-11-29 NOTE — ED Provider Notes (Signed)
?[facility] ?Provider Note ? ? ?CSN: [id number] ?Arrival date & time: 11/29/21  1859 ? ?  ? ?History ? ?Chief Complaint  ?Patient presents with  ? Shortness of Breath  ? ? ?[name] is a 66 y.o. female. ? ?Patient here with shortness of breath.  Patient with history of COPD currently on 2 L of oxygen at skilled nursing/rehab facility after prolonged hospitalization for respiratory failure and pneumonia.  Patient also with history of CAD, hypertension, depression/anxiety.  Patient started to feel short of breath today not responding well to her breathing treatments at the nursing facility.  They called EMS who gave her DuoNeb treatment and Solu-Medrol.  She still has had a cough and feels like she needs to cough something up.  She denies any fever but has had some chills.  Denies any chest pain, abdominal pain, nausea, vomiting.  Breathing treatments have made her feel better. ? ?The history is provided by the patient.  ? ?  ? ?Home Medications ?Prior to Admission medications   ?Medication Sig Start Date End Date Taking? Authorizing Provider  ?albuterol (PROVENTIL HFA;VENTOLIN HFA) 108 (90 Base) MCG/ACT inhaler Inhale 2 puffs into the lungs every 6 (six) hours as needed for wheezing or shortness of breath. 07/12/17   Janora Norlander, DO  ?Alcohol Swabs (B-D SINGLE USE SWABS REGULAR) PADS check blood sugars twice daily Dx E11.9 03/26/20   [name], FNP  ?ALPRAZolam (XANAX) 0.25 MG tablet Take 1 tablet (0.25 mg total) by mouth 3 (three) times daily as needed for anxiety. 11/27/21   Ghimire, Henreitta Leber, MD  ?aspirin EC 81 MG tablet Take 81 mg by mouth daily. Swallow whole.    [provider]  ?atorvastatin (LIPITOR) 80 MG tablet Take 1 tablet (80 mg total) by mouth at bedtime. 10/09/21   [name], FNP  ?Blood Glucose Calibration (TRUE METRIX LEVEL 1) Low SOLN USE AS DIRECTED 07/22/21   [name], FNP  ?Blood Glucose Monitoring  Suppl (TRUE METRIX AIR GLUCOSE METER) w/Device KIT check blood sugars twice daily Dx E11.9 04/02/20   [name], FNP  ?Budeson-Glycopyrrol-Formoterol (BREZTRI AEROSPHERE) 160-9-4.8 MCG/ACT AERO Inhale 2 puffs into the lungs in the morning and at bedtime. 10/09/21   [name], FNP  ?Cholecalciferol (VITAMIN D3) 5000 units TABS Take 10,000 Units by mouth every evening.    [provider]  ?escitalopram (LEXAPRO) 20 MG tablet Take 1 tablet (20 mg total) by mouth daily. 10/09/21   [name], FNP  ?fish oil-omega-3 fatty acids 1000 MG capsule Take 1,000 mg by mouth at bedtime.    [provider]  ?furosemide (LASIX) 40 MG tablet Take 1 tablet (40 mg total) by mouth daily. 11/27/21   Ghimire, Henreitta Leber, MD  ?glucose blood (TRUE METRIX BLOOD GLUCOSE TEST) test strip TEST BLOOD SUGAR TWICE DAILY 07/22/21   [name], FNP  ?hydrOXYzine (VISTARIL) 25 MG capsule Take 1 capsule by mouth three times daily as needed 08/05/20   [name], FNP  ?ipratropium-albuterol (DUONEB) 0.5-2.5 (3) MG/3ML SOLN Take 3 mLs by nebulization every 4 (four) hours as needed. 11/27/21   Ghimire, Henreitta Leber, MD  ?metFORMIN (GLUCOPHAGE) 500 MG tablet Take 1 tablet (500 mg total) by mouth 2 (two) times daily with a meal. 10/09/21   [name], FNP  ?metoprolol tartrate (LOPRESSOR) 25 MG tablet Take 1 tablet (25 mg total) by mouth 2 (two) times daily. 11/27/21 11/27/22  GhimireHenreitta Leber, MD  ?nitroGLYCERIN (NITROSTAT) 0.4 MG SL  tablet Place 1 tablet (0.4 mg total) under the tongue every 5 (five) minutes as needed for chest pain. 11/27/21   Ghimire, Henreitta Leber, MD  ?potassium chloride SA (KLOR-CON M) 20 MEQ tablet Take 1 tablet (20 mEq total) by mouth daily. 11/27/21   Ghimire, Henreitta Leber, MD  ?pramipexole (MIRAPEX) 1 MG tablet Take 1 tablet (1 mg total) by mouth in the morning and at bedtime. 10/09/21   [name], FNP  ?TRUEplus Lancets 33G MISC USE TO CHECK BLOOD SUGAR  TWICE DAILY 07/22/21   Hassell Done, [name], FNP  ?umeclidinium bromide (INCRUSE ELLIPTA) 62.5 MCG/ACT AEPB Inhale 1 puff into the lungs daily. 11/28/21   Ghimire, Henreitta Leber, MD  ?vitamin B-12 (CYANOCOBALAMIN) 1000 MCG tablet Take 1,000 mcg by mouth daily.    [provider]  ?   ? ?Allergies    ?Food, Nutmeg oil (myristica oil), Other, and Amlodipine   ? ?Review of Systems   ?Review of Systems ? ?Physical Exam ?Updated Vital Signs ?BP (!) 111/46   Pulse 89   Temp 98.1 ?F (36.7 ?C) (Oral)   Resp 15   LMP 10/17/2005   SpO2 95%  ?Physical Exam ?Vitals and nursing note reviewed.  ?Constitutional:   ?   General: She is not in acute distress. ?   Appearance: She is well-developed.  ?HENT:  ?   Head: Normocephalic and atraumatic.  ?   Mouth/Throat:  ?   Mouth: Mucous membranes are moist.  ?Eyes:  ?   Conjunctiva/sclera: Conjunctivae normal.  ?   Pupils: Pupils are equal, round, and reactive to light.  ?Cardiovascular:  ?   Rate and Rhythm: Normal rate and regular rhythm.  ?   Pulses: Normal pulses.  ?   Heart sounds: Normal heart sounds. No murmur heard. ?Pulmonary:  ?   Effort: Tachypnea present. No respiratory distress.  ?   Breath sounds: Decreased breath sounds and wheezing present.  ?Abdominal:  ?   Palpations: Abdomen is soft.  ?   Tenderness: There is no abdominal tenderness.  ?Musculoskeletal:     ?   General: No swelling. Normal range of motion.  ?   Cervical back: Normal range of motion and neck supple.  ?   Right lower leg: No edema.  ?   Left lower leg: No edema.  ?Skin: ?   General: Skin is warm and dry.  ?   Capillary Refill: Capillary refill takes less than 2 seconds.  ?Neurological:  ?   General: No focal deficit present.  ?   Mental Status: She is alert.  ?Psychiatric:     ?   Mood and Affect: Mood normal.  ? ? ?ED Results / Procedures / Treatments   ?Labs ?(all labs ordered are listed, but only abnormal results are displayed) ?Labs Reviewed  ?CBC WITH DIFFERENTIAL/PLATELET - Abnormal;  Notable for the following components:  ?    Result Value  ? WBC 11.7 (*)   ? Hemoglobin 10.8 (*)   ? MCH 24.3 (*)   ? MCHC 28.3 (*)   ? RDW 20.5 (*)   ? Neutro Abs 9.5 (*)   ? All other components within normal limits  ?COMPREHENSIVE METABOLIC PANEL - Abnormal; Notable for the following components:  ? Glucose, Bld 164 (*)   ? Creatinine, Ser 1.14 (*)   ? Total Protein 5.9 (*)   ? Albumin 2.7 (*)   ? GFR, Estimated 53 (*)   ? All other components within normal limits  ?TROPONIN I (HIGH  SENSITIVITY) - Abnormal; Notable for the following components:  ? Troponin I (High Sensitivity) 20 (*)   ? All other components within normal limits  ?RESP PANEL BY RT-PCR (FLU A&B, COVID) ARPGX2  ?BRAIN NATRIURETIC PEPTIDE  ? ? ?EKG ?EKG Interpretation ? ?Date/Time:  Saturday Nov 29 2021 19:17:55 EDT ?Ventricular Rate:  92 ?PR Interval:  141 ?QRS Duration: 103 ?QT Interval:  361 ?QTC Calculation: 447 ?R Axis:   145 ?Text Interpretation: Sinus rhythm Probable left atrial enlargement Right axis deviation Abnormal R-wave progression, late transition Nonspecific T abnormalities, inferior leads Confirmed by Ronnald Nian, Terek Bee ([id number]) on 11/29/2021 7:42:28 PM ? ?Radiology ?DG Chest Portable 1 View ? ?Result Date: 11/29/2021 ?CLINICAL DATA:  Shortness of breath. EXAM: PORTABLE CHEST 1 VIEW COMPARISON:  Nov 25, 2021 FINDINGS: The cardiac silhouette is enlarged and unchanged in size. Diffuse, chronic appearing increased interstitial lung markings are seen. Mild atelectasis is seen within the bilateral lung bases. There is no evidence of a pleural effusion or pneumothorax. Degenerative changes are noted throughout the thoracic spine. IMPRESSION: Chronic appearing increased interstitial lung markings with mild bibasilar atelectasis. Electronically Signed   By: Virgina Norfolk M.D.   On: 11/29/2021 20:23   ? ?Procedures ?Procedures  ? ? ?Medications Ordered in ED ?Medications  ?albuterol (PROVENTIL) (2.5 MG/3ML) 0.083% nebulizer solution 10 mg (has no  administration in time range)  ? ? ?ED Course/ Medical Decision Making/ A&P ?  ?                        ?Medical Decision Making ?Amount and/or Complexity of Data Reviewed ?Labs: ordered. ?Radiology: ordered. ? ?Risk ?Pres

## 2021-11-29 NOTE — ED Triage Notes (Signed)
Pt presents from rehab, pt was 82-84% on room air, pt wearing 2L [place] at baseline s/p admission. Pt reports "it feels like I'm breathing from two different places." Pt received solumedrol and duoneb w/ EMS.  ?

## 2021-11-29 NOTE — Assessment & Plan Note (Signed)
--   Stable.  Continue metoprolol. 

## 2021-11-29 NOTE — H&P (Addendum)
?History and Physical  ? ? ?Patient: [name] RSW:[id number] DOB: 11-15-1955 ?DOA: 11/29/2021 ?DOS: the patient was seen and examined on 11/29/2021 ?PCP: [name], FNP  ?Patient coming from: [facility] ? ?Chief Complaint:  ?Chief Complaint  ?Patient presents with  ? Shortness of Breath  ? ?HPI: [name] is a 66 y.o. female with medical history significant of CAD s/p PCI to RCA, hypertension, COPD with chronic hypoxemia and hypercarbia on 3 L, HFpEF, CKD stage III, type 2 diabetes, hypertension who presents from rehab with increasing shortness of breath. ? ?Patient was just discharged 2 days ago to rehab following a prolonged hospitalization from 4/22 to 5/4 after presenting with encephalopathy and found to be in septic shock from pneumonia with acute hypoxic and hypercapnic respiratory failure requiring intubation.  She completed a full 7-day course of antibiotic therapy.  Respiratory failure thought to be due to pneumonia and HFpEF exacerbation superimposed on underlying COPD.  She was discharged on 3 L of oxygen to use 24/7.  Pulmonology also recommended BiPAP nightly for probable OSA. ? ?Patient reports that at the facility they did not start her on BiPAP since they not know her settings.  She also reports not getting all of her medications including inhaler for at least 1 day.  Reports worsening increasing shortness of breath with pleuritic chest pain.  Has nausea but no vomiting.  Continues to have ongoing tobacco use. ? ?In the ED, she was afebrile normotensive but tachypneic and sitting up in tripod position with audible wheezing and dyspnea in between sentences on my evaluation.  Has mild leukocytosis of 11.7.  Mild AKI with creatinine of 1.14.  BNP of 528, troponin of 20.  Both of which is lower than prior hospitalization. ?Chest x-ray shows bibasilar atelectasis but no new pneumonia or edema. ? ?She was reportedly given steroid and DuoNeb by EMS.  She was started on continuous  albuterol nebulizer and given IV magnesium in the ED. ?Hospitalist then called for admission. ?Review of Systems: unable to review all systems due to the inability of the patient to answer questions. ?Past Medical History:  ?Diagnosis Date  ? Anxiety   ? Breast cancer ([place]) 06/30/11  ? inv ductal, ER/PR +, her-2 -  ? CAD (coronary artery disease)   ? NSTEMI 4/08: OM3 occluded (PCI unsuccessful), dRCA 95% => BMS, inf HK, EF 50%;   b. MV 11/12:  [place] ischemia => c.  [place] 11/12:  dLAD 70%, OM1 70-80%, then occluded (no change from 2008), dOM filled L->L collats, mRCA stent occluded, dRCA filled L->R collats, EF 55-65% => med Rx (consider PCI of RCA if refractory angina)  ? Carotid stenosis   ? dopplers 5/08: 0-30% bilateral; 10/12: 0-39% B/L ICA => f/u 04/2013  ? Chronic kidney disease   ? COPD (chronic obstructive pulmonary disease) ([place])   ? Depression   ? Depression with anxiety   ? GERD (gastroesophageal reflux disease)   ? OTC acid reducer prn  ? Headache(784.0)   ? sinus; occ. migraines  ? Hx of radiation therapy 08/24/11 to 10/07/11  ? L breast  ? Hyperlipidemia   ? Hypertension   ? under control; has been on med. x 4 yrs.  ? Myocardial infarction [facility])   ? Stable angina (HCC)   ? a. med Rx after cath 05/2011  ? Stress incontinence, female   ? ?Past Surgical History:  ?Procedure Laterality Date  ? AXILLARY LYMPH NODE DISSECTION  07/31/2011  ? Procedure: AXILLARY LYMPH  NODE DISSECTION;  Surgeon: [name], MD;  Location: [facility];  Service: General;  Laterality: Left;  left axillary sentinal node biopsy  ? BREAST LUMPECTOMY Left 06/30/2011  ? BREAST SURGERY    ? CARDIAC CATHETERIZATION  11/02/2006; 06/03/2011  ? [place]  ? CORONARY STENT PLACEMENT  11/02/2006  ? ELBOW SURGERY    ? right  ? LEFT HEART CATH AND CORONARY ANGIOGRAPHY N/A 10/07/2020  ? Procedure: LEFT HEART CATH AND CORONARY ANGIOGRAPHY;  Surgeon: [name], MD;  Location: [facility];  Service: Cardiovascular;   Laterality: N/A;  ? ?Social History:  reports that she has been smoking cigarettes. She has a 45.00 pack-year smoking history. She has never used smokeless tobacco. She reports current alcohol use of about 1.0 standard drink per week. She reports that she does not use drugs. ? ?Allergies  ?Allergen Reactions  ? Food Other (See Comments)  ?  Nutmeg= difficulty breathing  ? Nutmeg Oil (Myristica Oil) Hives  ? Other Other (See Comments)  ?  Nutmeg= difficulty breathing  ? Amlodipine Swelling  ? ? ?Family History  ?Problem Relation Age of Onset  ? Heart disease Mother   ? Heart disease Father   ? Cancer Maternal Aunt   ?     pt unaware of what kind  ? Cancer Maternal Grandmother   ?     ovarian  ? Heart attack Sister   ? Thyroid disease Daughter   ? Anxiety disorder Daughter   ? Depression Daughter   ? Bipolar disorder Sister   ? ? ?Prior to Admission medications   ?Medication Sig Start Date End Date Taking? Authorizing Provider  ?albuterol (PROVENTIL HFA;VENTOLIN HFA) 108 (90 Base) MCG/ACT inhaler Inhale 2 puffs into the lungs every 6 (six) hours as needed for wheezing or shortness of breath. 07/12/17   Janora Norlander, DO  ?Alcohol Swabs (B-D SINGLE USE SWABS REGULAR) PADS check blood sugars twice daily Dx E11.9 03/26/20   [name], FNP  ?ALPRAZolam (XANAX) 0.25 MG tablet Take 1 tablet (0.25 mg total) by mouth 3 (three) times daily as needed for anxiety. 11/27/21   Ghimire, Henreitta Leber, MD  ?aspirin EC 81 MG tablet Take 81 mg by mouth daily. Swallow whole.    [provider]  ?atorvastatin (LIPITOR) 80 MG tablet Take 1 tablet (80 mg total) by mouth at bedtime. 10/09/21   [name], FNP  ?Blood Glucose Calibration (TRUE METRIX LEVEL 1) Low SOLN USE AS DIRECTED 07/22/21   [name], FNP  ?Blood Glucose Monitoring Suppl (TRUE METRIX AIR GLUCOSE METER) w/Device KIT check blood sugars twice daily Dx E11.9 04/02/20   [name], FNP  ?Budeson-Glycopyrrol-Formoterol  (BREZTRI AEROSPHERE) 160-9-4.8 MCG/ACT AERO Inhale 2 puffs into the lungs in the morning and at bedtime. 10/09/21   [name], FNP  ?Cholecalciferol (VITAMIN D3) 5000 units TABS Take 10,000 Units by mouth every evening.    [provider]  ?escitalopram (LEXAPRO) 20 MG tablet Take 1 tablet (20 mg total) by mouth daily. 10/09/21   [name], FNP  ?fish oil-omega-3 fatty acids 1000 MG capsule Take 1,000 mg by mouth at bedtime.    [provider]  ?furosemide (LASIX) 40 MG tablet Take 1 tablet (40 mg total) by mouth daily. 11/27/21   Ghimire, Henreitta Leber, MD  ?glucose blood (TRUE METRIX BLOOD GLUCOSE TEST) test strip TEST BLOOD SUGAR TWICE DAILY 07/22/21   [name], FNP  ?hydrOXYzine (VISTARIL) 25 MG capsule Take 1  capsule by mouth three times daily as needed 08/05/20   [name], FNP  ?ipratropium-albuterol (DUONEB) 0.5-2.5 (3) MG/3ML SOLN Take 3 mLs by nebulization every 4 (four) hours as needed. 11/27/21   Ghimire, Henreitta Leber, MD  ?metFORMIN (GLUCOPHAGE) 500 MG tablet Take 1 tablet (500 mg total) by mouth 2 (two) times daily with a meal. 10/09/21   [name], FNP  ?metoprolol tartrate (LOPRESSOR) 25 MG tablet Take 1 tablet (25 mg total) by mouth 2 (two) times daily. 11/27/21 11/27/22  Ghimire, Henreitta Leber, MD  ?nitroGLYCERIN (NITROSTAT) 0.4 MG SL tablet Place 1 tablet (0.4 mg total) under the tongue every 5 (five) minutes as needed for chest pain. 11/27/21   Ghimire, Henreitta Leber, MD  ?potassium chloride SA (KLOR-CON M) 20 MEQ tablet Take 1 tablet (20 mEq total) by mouth daily. 11/27/21   Ghimire, Henreitta Leber, MD  ?pramipexole (MIRAPEX) 1 MG tablet Take 1 tablet (1 mg total) by mouth in the morning and at bedtime. 10/09/21   [name], FNP  ?TRUEplus Lancets 33G MISC USE TO CHECK BLOOD SUGAR TWICE DAILY 07/22/21   Hassell Done, [name], FNP  ?umeclidinium bromide (INCRUSE ELLIPTA) 62.5 MCG/ACT AEPB Inhale 1 puff into the lungs daily. 11/28/21   Ghimire,  Henreitta Leber, MD  ?vitamin B-12 (CYANOCOBALAMIN) 1000 MCG tablet Take 1,000 mcg by mouth daily.    [provider]  ? ? ?Physical Exam: ?Vitals:  ? 11/29/21 2000 11/29/21 2015 11/29/21 2030 11/29/21 2045

## 2021-11-29 NOTE — Assessment & Plan Note (Signed)
Creatinine elevated to 1.14. ?Given 500cc bolus ?Follow with repeat labs tomorrow ?

## 2021-11-29 NOTE — Assessment & Plan Note (Signed)
S/p PCI to RCA in 2012 ?Continue ASA, statin and beta-blocker ?

## 2021-11-29 NOTE — Progress Notes (Signed)
Spoke with RN, RN is okay to administer '10mg'$  Albuterol tx via Nebulizer to assist RRT. ?

## 2021-11-29 NOTE — Assessment & Plan Note (Addendum)
Secondary to COPD exacerbation and possibly from missing her inhalers at rehab.  Patient just discharged 2 days ago following septic shock and acute hypoxic and hypercapnic respiratory failure requiring intubation from pneumonia and CHF exacerbation.  Discharged on 3 L 24/7. ?-PCCM recommended BiPAP nightly at previous admission for probable OSA but did not get started in rehab.  Will start BiPAP nightly . ?-Daily IV Solu-Medrol ?-duoneb scheduled q6hr ?

## 2021-11-29 NOTE — Assessment & Plan Note (Signed)
Continue Mirapex.

## 2021-11-29 NOTE — ED Notes (Signed)
Updated family on POC per pt's request ?

## 2021-11-30 DIAGNOSIS — Z7984 Long term (current) use of oral hypoglycemic drugs: Secondary | ICD-10-CM | POA: Diagnosis not present

## 2021-11-30 DIAGNOSIS — Z20822 Contact with and (suspected) exposure to covid-19: Secondary | ICD-10-CM | POA: Diagnosis not present

## 2021-11-30 DIAGNOSIS — J441 Chronic obstructive pulmonary disease with (acute) exacerbation: Secondary | ICD-10-CM | POA: Diagnosis not present

## 2021-11-30 DIAGNOSIS — Z7982 Long term (current) use of aspirin: Secondary | ICD-10-CM | POA: Diagnosis not present

## 2021-11-30 DIAGNOSIS — I2510 Atherosclerotic heart disease of native coronary artery without angina pectoris: Secondary | ICD-10-CM | POA: Diagnosis not present

## 2021-11-30 DIAGNOSIS — E1122 Type 2 diabetes mellitus with diabetic chronic kidney disease: Secondary | ICD-10-CM | POA: Diagnosis not present

## 2021-11-30 DIAGNOSIS — I130 Hypertensive heart and chronic kidney disease with heart failure and stage 1 through stage 4 chronic kidney disease, or unspecified chronic kidney disease: Secondary | ICD-10-CM | POA: Diagnosis not present

## 2021-11-30 DIAGNOSIS — N1831 Chronic kidney disease, stage 3a: Secondary | ICD-10-CM | POA: Diagnosis not present

## 2021-11-30 DIAGNOSIS — Z7901 Long term (current) use of anticoagulants: Secondary | ICD-10-CM | POA: Diagnosis not present

## 2021-11-30 LAB — CBC
HCT: 36.9 % (ref 36.0–46.0)
Hemoglobin: 10.6 g/dL — ABNORMAL LOW (ref 12.0–15.0)
MCH: 24.1 pg — ABNORMAL LOW (ref 26.0–34.0)
MCHC: 28.7 g/dL — ABNORMAL LOW (ref 30.0–36.0)
MCV: 83.9 fL (ref 80.0–100.0)
Platelets: 292 10*3/uL (ref 150–400)
RBC: 4.4 MIL/uL (ref 3.87–5.11)
RDW: 20.6 % — ABNORMAL HIGH (ref 11.5–15.5)
WBC: 12.3 10*3/uL — ABNORMAL HIGH (ref 4.0–10.5)
nRBC: 0 % (ref 0.0–0.2)

## 2021-11-30 LAB — BASIC METABOLIC PANEL
Anion gap: 15 (ref 5–15)
BUN: 15 mg/dL (ref 8–23)
CO2: 20 mmol/L — ABNORMAL LOW (ref 22–32)
Calcium: 8.9 mg/dL (ref 8.9–10.3)
Chloride: 103 mmol/L (ref 98–111)
Creatinine, Ser: 1.29 mg/dL — ABNORMAL HIGH (ref 0.44–1.00)
GFR, Estimated: 46 mL/min — ABNORMAL LOW (ref >60)
Glucose, Bld: 332 mg/dL — ABNORMAL HIGH (ref 70–99)
Potassium: 3.4 mmol/L — ABNORMAL LOW (ref 3.5–5.1)
Sodium: 138 mmol/L (ref 135–145)

## 2021-11-30 LAB — GLUCOSE, CAPILLARY
Glucose-Capillary: 280 mg/dL — ABNORMAL HIGH (ref 70–99)
Glucose-Capillary: 234 mg/dL — ABNORMAL HIGH (ref 70–99)
Glucose-Capillary: 165 mg/dL — ABNORMAL HIGH (ref 70–99)

## 2021-11-30 LAB — TROPONIN I (HIGH SENSITIVITY): Troponin I (High Sensitivity): 27 ng/L — ABNORMAL HIGH (ref <18)

## 2021-11-30 MED ORDER — VITAMIN D 25 MCG (1000 UNIT) PO TABS: 5000.0000 [IU] | ORAL_TABLET | Freq: Every evening | ORAL | Status: DC

## 2021-11-30 MED ORDER — ASPIRIN EC 81 MG PO TBEC
81.0000 mg | DELAYED_RELEASE_TABLET | Freq: Every day | ORAL | Status: DC
  Filled 2021-11-30: qty 1

## 2021-11-30 MED ORDER — BUDESON-GLYCOPYRROL-FORMOTEROL 160-9-4.8 MCG/ACT IN AERO: 2.0000 | INHALATION_SPRAY | Freq: Two times a day (BID) | RESPIRATORY_TRACT | Status: DC

## 2021-11-30 MED ORDER — AZELASTINE HCL 0.1 % NA SOLN
1.0000 | Freq: Two times a day (BID) | NASAL | Status: DC
  Administered 2021-11-30: 1 via NASAL
  Filled 2021-11-30: qty 30

## 2021-11-30 MED ORDER — ALPRAZOLAM 0.25 MG PO TABS: 0.2500 mg | ORAL_TABLET | Freq: Three times a day (TID) | ORAL | 0 refills | Status: DC | PRN

## 2021-11-30 MED ORDER — FLUTICASONE FUROATE-VILANTEROL 100-25 MCG/ACT IN AEPB
1.0000 | INHALATION_SPRAY | Freq: Every day | RESPIRATORY_TRACT | Status: DC
  Administered 2021-11-30: 1 via RESPIRATORY_TRACT
  Filled 2021-11-30: qty 28

## 2021-11-30 MED ORDER — UMECLIDINIUM BROMIDE 62.5 MCG/ACT IN AEPB
1.0000 | INHALATION_SPRAY | Freq: Every day | RESPIRATORY_TRACT | Status: DC
  Filled 2021-11-30: qty 7

## 2021-11-30 MED ORDER — ALBUTEROL SULFATE (2.5 MG/3ML) 0.083% IN NEBU: 2.5000 mg | INHALATION_SOLUTION | Freq: Four times a day (QID) | RESPIRATORY_TRACT | Status: DC | PRN

## 2021-11-30 MED ORDER — UMECLIDINIUM BROMIDE 62.5 MCG/ACT IN AEPB
1.0000 | INHALATION_SPRAY | Freq: Every day | RESPIRATORY_TRACT | Status: DC
  Administered 2021-11-30: 1 via RESPIRATORY_TRACT
  Filled 2021-11-30: qty 7

## 2021-11-30 MED ORDER — INSULIN GLARGINE-YFGN 100 UNIT/ML ~~LOC~~ SOLN
15.0000 [IU] | Freq: Every day | SUBCUTANEOUS | Status: DC
  Administered 2021-11-30: 15 [IU] via SUBCUTANEOUS
  Filled 2021-11-30: qty 0.15

## 2021-11-30 MED ORDER — INSULIN ASPART 100 UNIT/ML IJ SOLN
0.0000 [IU] | Freq: Three times a day (TID) | INTRAMUSCULAR | Status: DC
  Administered 2021-11-30: 3 [IU] via SUBCUTANEOUS

## 2021-11-30 MED ORDER — INSULIN ASPART 100 UNIT/ML IJ SOLN: 0.0000 [IU] | Freq: Every day | INTRAMUSCULAR | Status: DC

## 2021-11-30 MED ORDER — NITROGLYCERIN 0.4 MG SL SUBL: 0.4000 mg | SUBLINGUAL_TABLET | SUBLINGUAL | Status: DC | PRN

## 2021-11-30 MED ORDER — IPRATROPIUM-ALBUTEROL 0.5-2.5 (3) MG/3ML IN SOLN: 3.0000 mL | RESPIRATORY_TRACT | Status: DC | PRN

## 2021-11-30 MED ORDER — ESCITALOPRAM OXALATE 20 MG PO TABS
20.0000 mg | ORAL_TABLET | Freq: Every day | ORAL | Status: DC
Start: 2021-11-30 — End: 2021-11-30
  Administered 2021-11-30: 20 mg via ORAL
  Filled 2021-11-30: qty 1

## 2021-11-30 MED ORDER — ASPIRIN EC 81 MG PO TBEC
81.0000 mg | DELAYED_RELEASE_TABLET | Freq: Every day | ORAL | Status: DC
  Administered 2021-11-30: 81 mg via ORAL

## 2021-11-30 MED ORDER — FUROSEMIDE 40 MG PO TABS
40.0000 mg | ORAL_TABLET | Freq: Every day | ORAL | Status: DC
Start: 2021-11-30 — End: 2021-11-30
  Administered 2021-11-30: 40 mg via ORAL
  Filled 2021-11-30: qty 1

## 2021-11-30 MED ORDER — PRAMIPEXOLE DIHYDROCHLORIDE 1 MG PO TABS
1.0000 mg | ORAL_TABLET | Freq: Two times a day (BID) | ORAL | Status: DC
Start: 2021-11-30 — End: 2021-11-30
  Administered 2021-11-30 (×2): 1 mg via ORAL
  Filled 2021-11-30 (×3): qty 1

## 2021-11-30 MED ORDER — PREDNISONE 50 MG PO TABS: 50.0000 mg | ORAL_TABLET | Freq: Every day | ORAL | 0 refills | Status: AC

## 2021-11-30 MED ORDER — POTASSIUM CHLORIDE CRYS ER 20 MEQ PO TBCR
40.0000 meq | EXTENDED_RELEASE_TABLET | Freq: Once | ORAL | Status: AC
  Administered 2021-11-30: 40 meq via ORAL
  Filled 2021-11-30: qty 2

## 2021-11-30 MED ORDER — METOPROLOL TARTRATE 25 MG PO TABS
25.0000 mg | ORAL_TABLET | Freq: Two times a day (BID) | ORAL | Status: DC
Start: 2021-11-30 — End: 2021-11-30
  Administered 2021-11-30 (×2): 25 mg via ORAL
  Filled 2021-11-30 (×2): qty 1

## 2021-11-30 MED ORDER — ALPRAZOLAM 0.25 MG PO TABS
0.2500 mg | ORAL_TABLET | Freq: Three times a day (TID) | ORAL | Status: DC | PRN
  Administered 2021-11-30: 0.25 mg via ORAL
  Filled 2021-11-30: qty 1

## 2021-11-30 MED ORDER — ATORVASTATIN CALCIUM 80 MG PO TABS
80.0000 mg | ORAL_TABLET | Freq: Every day | ORAL | Status: DC
  Administered 2021-11-30: 80 mg via ORAL
  Filled 2021-11-30: qty 1

## 2021-11-30 NOTE — Progress Notes (Addendum)
Discharge:  Pt d/c from room via stretcher with PTARt.  Discharge instructions given to the patient.  No questions from pt,    IV d/ced, tele removed and no complaints of pain or discomfort. I called the facility x2 to give report and was transferred to the 108 hall and no one answered the phone.  ?

## 2021-11-30 NOTE — Discharge Summary (Signed)
PatientPhysician Discharge Summary  ?[name] [name] DOB: 06/21/1956 DOA: 11/29/2021 ? ?PCP: Tracy Pretty, FNP ? ?Admit date: 11/29/2021 ?Discharge date: 11/30/2021 ?30 Day Unplanned Readmission Risk Score   ? ?Flowsheet Row ED to Hosp-Admission (Discharged) from 11/15/2021 in [facility]  ?30 Day Unplanned Readmission Risk Score (%) 17.53 Filed at 11/27/2021 1600  ? ?  ? ? This score is the patient's risk of an unplanned readmission within 30 days of being discharged (0 -100%). The score is based on dignosis, age, lab data, medications, orders, and past utilization.   ?Low:  0-14.9   Medium: 15-21.9   High: 22-29.9   Extreme: 30 and above ? ?  ? ?  ? ? ? ?Admitted From: [facility] ?Disposition: [facility] ? ?Recommendations for Outpatient Follow-up:  ?Follow up with PCP in 1-2 weeks ?Please obtain BMP/CBC in one week ?Please follow up with your PCP on the following pending results: ?Unresulted Labs (From admission, onward)  ? ? None  ? ?  ?  ? ? ?Home Health: None ?Equipment/Devices: None ? ?Discharge Condition: Stable ?CODE STATUS: Full code ?Diet recommendation: Cardiac and diabetic ? ?Subjective: Seen and examined.  Feels much better.  Back at baseline and at 3 L and able to speak in full sentences.  Agreeable with the discharge today. ? ?Following HPI is copied from my admitting hospitalist colleague Dr. Idell Pickles H&P. ?HPI: [name] is a 66 y.o. female with medical history significant of CAD s/p PCI to RCA, hypertension, COPD with chronic hypoxemia and hypercarbia on 3 L, HFpEF, CKD stage III, type 2 diabetes, hypertension who presents from rehab with increasing shortness of breath. ?  ?Patient was just discharged 2 days ago to rehab following a prolonged hospitalization from 4/22 to 5/4 after presenting with encephalopathy and found to be in septic shock from pneumonia with acute hypoxic and hypercapnic respiratory failure requiring intubation.  She completed a  full 7-day course of antibiotic therapy.  Respiratory failure thought to be due to pneumonia and HFpEF exacerbation superimposed on underlying COPD.  She was discharged on 3 L of oxygen to use 24/7.  Pulmonology also recommended BiPAP nightly for probable OSA. ?  ?Patient reports that at the facility they did not start her on BiPAP since they not know her settings.  She also reports not getting all of her medications including inhaler for at least 1 day.  Reports worsening increasing shortness of breath with pleuritic chest pain.  Has nausea but no vomiting.  Continues to have ongoing tobacco use. ?  ?In the ED, she was afebrile normotensive but tachypneic and sitting up in tripod position with audible wheezing and dyspnea in between sentences on my evaluation.  Has mild leukocytosis of 11.7.  Mild AKI with creatinine of 1.14.  BNP of 528, troponin of 20.  Both of which is lower than prior hospitalization. ?Chest x-ray shows bibasilar atelectasis but no new pneumonia or edema. ? ?She was reportedly given steroid and DuoNeb by EMS.  She was started on continuous albuterol nebulizer and given IV magnesium in the ED. ?Hospitalist then called for admission. ? ?Brief/Interim Summary: Patient was mainly admitted due to acute on chronic hypoxic respiratory secondary to acute COPD exacerbation as she was noted to have audible wheezes and inability to speak in full sentences despite of getting continuous nebulizer.  She required BiPAP in the beginning.  She was then weaned down.  When seen this morning, patient is fully alert and oriented  and is able to speak in full sentences.  She did not have any wheezes at all.  She is back on 3 L of oxygen which is her baseline.  Based on this, she appears to be back at baseline.  Discussed with patient and she is agreeable with the discharge plan.  She did have mild AKI on CKD stage IIIa which hopefully will improve gradually.  Mild hypokalemia, she will receive potassium replacement  before discharge. ? ?Discharge plan was discussed with patient and/or family member and they verbalized understanding and agreed with it.  ?Discharge Diagnoses:  ?Principal Problem: ?  COPD exacerbation ([place]) ?Active Problems: ?  Acute on chronic respiratory failure with hypoxia (HCC) ?  Hypertension ?  CAD S/P percutaneous coronary angioplasty ?  Generalized anxiety disorder ?  Restless leg syndrome ?  Depression ?  Acute renal failure superimposed on stage 3a chronic kidney disease ([place]) ? ? ? ?Discharge Instructions ? ? ?Allergies as of 11/30/2021   ? ?   Reactions  ? Food Other (See Comments)  ? Nutmeg= difficulty breathing  ? Nutmeg Oil (myristica Oil) Hives  ? Other Other (See Comments)  ? Nutmeg= difficulty breathing  ? Amlodipine Swelling  ? ?  ? ?  ?Medication List  ?  ? ?TAKE these medications   ? ?albuterol 108 (90 Base) MCG/ACT inhaler ?Commonly known as: VENTOLIN HFA ?Inhale 2 puffs into the lungs every 6 (six) hours as needed for wheezing or shortness of breath. ?  ?ALPRAZolam 0.25 MG tablet ?Commonly known as: Duanne Moron ?Take 1 tablet (0.25 mg total) by mouth 3 (three) times daily as needed for anxiety. ?  ?aspirin EC 81 MG tablet ?Take 81 mg by mouth daily. Swallow whole. ?  ?atorvastatin 80 MG tablet ?Commonly known as: LIPITOR ?Take 1 tablet (80 mg total) by mouth at bedtime. ?  ?B-D SINGLE USE SWABS REGULAR Pads ?check blood sugars twice daily Dx E11.9 ?  ?Breztri Aerosphere 160-9-4.8 MCG/ACT Aero ?Generic drug: Budeson-Glycopyrrol-Formoterol ?Inhale 2 puffs into the lungs in the morning and at bedtime. ?  ?escitalopram 20 MG tablet ?Commonly known as: LEXAPRO ?Take 1 tablet (20 mg total) by mouth daily. ?  ?fish oil-omega-3 fatty acids 1000 MG capsule ?Take 1,000 mg by mouth at bedtime. ?  ?furosemide 40 MG tablet ?Commonly known as: LASIX ?Take 1 tablet (40 mg total) by mouth daily. ?  ?hydrOXYzine 25 MG capsule ?Commonly known as: VISTARIL ?Take 1 capsule by mouth three times daily as needed ?   ?ipratropium-albuterol 0.5-2.5 (3) MG/3ML Soln ?Commonly known as: DUONEB ?Take 3 mLs by nebulization every 4 (four) hours as needed. ?  ?metFORMIN 500 MG tablet ?Commonly known as: GLUCOPHAGE ?Take 1 tablet (500 mg total) by mouth 2 (two) times daily with a meal. ?  ?metoprolol tartrate 25 MG tablet ?Commonly known as: LOPRESSOR ?Take 1 tablet (25 mg total) by mouth 2 (two) times daily. ?  ?nitroGLYCERIN 0.4 MG SL tablet ?Commonly known as: NITROSTAT ?Place 1 tablet (0.4 mg total) under the tongue every 5 (five) minutes as needed for chest pain. ?  ?potassium chloride SA 20 MEQ tablet ?Commonly known as: KLOR-CON M ?Take 1 tablet (20 mEq total) by mouth daily. ?  ?pramipexole 1 MG tablet ?Commonly known as: MIRAPEX ?Take 1 tablet (1 mg total) by mouth in the morning and at bedtime. ?  ?predniSONE 50 MG tablet ?Commonly known as: DELTASONE ?Take 1 tablet (50 mg total) by mouth daily with breakfast for 4 days. ?  ?True  Metrix Air Glucose Meter w/Device Kit ?check blood sugars twice daily Dx E11.9 ?  ?True Metrix Blood Glucose Test test strip ?Generic drug: glucose blood ?TEST BLOOD SUGAR TWICE DAILY ?  ?True Metrix Level 1 Low Soln ?USE AS DIRECTED ?  ?TRUEplus Lancets 33G Misc ?USE TO CHECK BLOOD SUGAR TWICE DAILY ?  ?umeclidinium bromide 62.5 MCG/ACT Aepb ?Commonly known as: INCRUSE ELLIPTA ?Inhale 1 puff into the lungs daily. ?  ?vitamin B-12 1000 MCG tablet ?Commonly known as: CYANOCOBALAMIN ?Take 1,000 mcg by mouth daily. ?  ?Vitamin D3 125 MCG (5000 UT) Tabs ?Take 10,000 Units by mouth every evening. ?  ? ?  ? ? Follow-up Information   ? ? Hassell Done, [name], FNP Follow up in 1 week(s).   ?Specialty: Family Medicine ?Contact information: ?[place] ?[place] [address] ?[phone] ? ? ?  ?  ? ? [name], MD .   ?Specialties: Cardiology, Radiology ?Contact information: ?[address] ?[address] ?[place] [address] ?[phone] ? ? ?  ?  ? ?  ?  ? ?  ? ?Allergies  ?Allergen  Reactions  ? Food Other (See Comments)  ?  Nutmeg= difficulty breathing  ? Nutmeg Oil (Myristica Oil) Hives  ? Other Other (See Comments)  ?  Nutmeg= difficulty breathing  ? Amlodipine Swelling  ? ? ?Consulta

## 2021-11-30 NOTE — TOC Transition Note (Addendum)
Transition of Care (TOC) - CM/SW Discharge Note ? ? ?Patient Details  ?Name: [name] ?MRN: [id number] ?Date of Birth: [birth year] ? ?Transition of Care (TOC) CM/SW Contact:  ?Amador Cunas, [name] ?Phone Number: ?11/30/2021, 10:33 AM ? ? ?Clinical Narrative: Pt for dc back to [place] today. Felicia at [facility] aware pt to return and confirmed they are prepared to admit pt to room 116. Packet complete and RN provided with number for report. DC summary faxed to [facility] at their request [phone]. Pt aware of dc and reports agreeable. PTAR called at 1023. SW signing off at dc.  ? ?Wandra Feinstein, MSW, [name] ?[phone] (coverage) ? ?   ? ? ? ?Final next level of care: [place] ?Barriers to Discharge: No Barriers Identified ? ? ?Patient Goals and CMS Choice ?  ?  ?  ? ?Discharge Placement ?  ?           ?  ?Patient to be transferred to facility by: PTAR ?  ?Patient and family notified of of transfer: 11/30/21 ? ?Discharge Plan and Services ?  ?  ?           ?  ?  ?  ?  ?  ?  ?  ?  ?  ?  ? ?Social Determinants of Health (SDOH) Interventions ?  ? ? ?Readmission Risk Interventions ?   ? View : No data to display.  ?  ?  ?  ? ? ? ? ? ?

## 2021-11-30 NOTE — Progress Notes (Signed)
Patient unable to tolerate BIPAP. Put back on 3L O2 via [place]. ?

## 2021-11-30 NOTE — Progress Notes (Signed)
Pt admitted from [facility] where she is a STR resident. Unable to reach admissions but spoke to RN at [facility], who confirmed with their DON that pt is able to return to room 116 today. Will arranged transport once dc summary available. MD updated.  ? ?Wandra Feinstein, MSW, [name] ?[phone] (coverage) ? ? ?

## 2021-12-01 ENCOUNTER — Non-Acute Institutional Stay (SKILLED_NURSING_FACILITY): Payer: Medicare HMO | Admitting: Internal Medicine

## 2021-12-01 ENCOUNTER — Encounter: Payer: Self-pay | Admitting: Internal Medicine

## 2021-12-01 ENCOUNTER — Other Ambulatory Visit: Payer: Self-pay | Admitting: Cardiology

## 2021-12-01 ENCOUNTER — Telehealth: Payer: Self-pay

## 2021-12-01 DIAGNOSIS — E1122 Type 2 diabetes mellitus with diabetic chronic kidney disease: Secondary | ICD-10-CM | POA: Diagnosis not present

## 2021-12-01 DIAGNOSIS — R6521 Severe sepsis with septic shock: Secondary | ICD-10-CM

## 2021-12-01 DIAGNOSIS — L89322 Pressure ulcer of left buttock, stage 2: Secondary | ICD-10-CM

## 2021-12-01 DIAGNOSIS — A419 Sepsis, unspecified organism: Secondary | ICD-10-CM | POA: Diagnosis not present

## 2021-12-01 DIAGNOSIS — I10 Essential (primary) hypertension: Secondary | ICD-10-CM

## 2021-12-01 DIAGNOSIS — N179 Acute kidney failure, unspecified: Secondary | ICD-10-CM

## 2021-12-01 DIAGNOSIS — N1831 Chronic kidney disease, stage 3a: Secondary | ICD-10-CM

## 2021-12-01 DIAGNOSIS — E782 Mixed hyperlipidemia: Secondary | ICD-10-CM

## 2021-12-01 DIAGNOSIS — E041 Nontoxic single thyroid nodule: Secondary | ICD-10-CM

## 2021-12-01 DIAGNOSIS — Z79899 Other long term (current) drug therapy: Secondary | ICD-10-CM

## 2021-12-01 DIAGNOSIS — N182 Chronic kidney disease, stage 2 (mild): Secondary | ICD-10-CM | POA: Diagnosis not present

## 2021-12-01 DIAGNOSIS — I25118 Atherosclerotic heart disease of native coronary artery with other forms of angina pectoris: Secondary | ICD-10-CM

## 2021-12-01 NOTE — Telephone Encounter (Signed)
She was d/c to SNF in [place] - will call us when they d/c her to home ?

## 2021-12-01 NOTE — Assessment & Plan Note (Signed)
Outpatient evaluation of thyroid nodules by PCP following discharge from SNF. ?

## 2021-12-01 NOTE — Assessment & Plan Note (Signed)
4/22 - 11/27/2021 glucoses ranged from 95 up to 199 with an A1c of 7.3 indicating adequate control in the context of advanced comorbidities.  Initial creatinine 3.4 and GFR 14 with a potassium of 7.1.  Post resolution of pneumonia and sepsis creatinine 1.02 and GFR greater than 60 indicating CKD stage II. ?

## 2021-12-01 NOTE — Assessment & Plan Note (Addendum)
Wound Care Nurse  to follow at SNF. ? ?

## 2021-12-01 NOTE — Assessment & Plan Note (Addendum)
4/22 potassium 7.1, BUN 95, creatinine 3.4, and GFR 14 indicating stage IV/V renal disease.  Posttreatment of sepsis potassium 3.5, creatinine 1.02, and GFR greater than 60 indicating CKD stage II. ?No indication for change in medicine regimen unless there is progression of CKD. ?

## 2021-12-01 NOTE — Patient Instructions (Signed)
See assessment and plan under each diagnosis in the problem list and acutely for this visit 

## 2021-12-01 NOTE — Assessment & Plan Note (Signed)
Stable at discharge on 3 L/min nasal oxygen.  AST 16 and ALT 104 at discharge.  Final potassium 3.1, creatinine 1.02, and GFR greater than 60 indicating CKD stage II. ?

## 2021-12-01 NOTE — Progress Notes (Signed)
? ?NURSING HOME LOCATION:  [facility] ?ROOM NUMBER:  [id number] ? ?CODE STATUS:  Full Code ? ?PCP:  [name] FNP ? ?This is a comprehensive admission note to this SNFperformed on this date less than 30 days from date of admission. ?Included are preadmission medical/surgical history; reconciled medication list; family history; social history and comprehensive review of systems.  ?Corrections and additions to the records were documented. Comprehensive physical exam was also performed. Additionally a clinical summary was entered for each active diagnosis pertinent to this admission in the Problem List to enhance continuity of care. ? ?HPI: She was hospitalized 4/22 - 11/27/2021 with septic shock due to pneumonia.  She presented with acute metabolic encephalopathy which was felt to be multifactorial related to the sepsis as well as benzodiazepines and mild hypercarbia.  CT scan was actually performed twice during the hospitalization and revealed no acute cns abnormalities. ?White blood count was 17,200 with left shift.  Lactic acid level was 3.1. ?She experienced acute hypoxic respiratory failure most likely related to the pneumonia superimposed on exacerbation of heart failure with preserved ejection fraction and advanced COPD.  Intubation was required, but she was able to be extubated to high flow oxygen at 10-12 L.  This was titrated down to 3 L of oxygen prior to discharge. ?Course was complicated by AKI on CKD stage IIIa and shock liver with transaminitis.  At admission BUN was 95, creatinine 3.4, and GFR 14 indicating stage IV/V CKD.  AST was 1863 and ALT 1879. ?Because of lethargy benzodiazepine/Xanax dose was lowered and changed to as needed.  Lexapro was continued for anxiety/depression which is chronic. ?Stage II left medial buttocks pressure injury was present at admission and was followed by wound care team while as an inpatient.  ?Chest CT 5/2 revealed bilateral thyroid nodules  of 1.8 cm for which outpatient evaluation was to be performed.  TSH was therapeutic at 2.011. ?Glucoses ranged from 95 up to 199.  A1c was 7.3% indicating good DM control. ?Prior to discharge potassium was 3.5, creatinine 1.02, and GFR greater than 60 indicating CKD stage II.  AST was 16 and ALT 104.  Total protein was 4.8 and albumin 3.8 indicating severe protein/caloric malnutrition.  Prior to discharge white count was 13,300.  H/H had dropped from 13.3/46.9 down to 10.8/37. ?PT/OT recommended SNF placement for rehab post discharge. ? ?She was sent back to the ED for acute treatment of exacerbation of COPD on 5/6.  She responded rapidly and was returned to the SNF without need for rehospitalization ? ?Past medical and surgical history: Includes restless leg syndrome, history of breast cancer,CAD w hx of MI,PAD,COPD, anxiety/depression,GERD, dyslipidemia,& HTN. ?Surgeries & procedures include lumpectomy, axillary node dissection,coronary stenting, & elbow surgery. ? ?Social history: occasional alcohol ; 45 pack year smoker.  She told me she smoked from age 44 up to 29 up to 2 packs/day for 5 years.  Prior to admission she was smoking less than a pack a day. ? ?Family history:reviewed. Both parents had CAD. ?  ?Review of systems: Her recall of the admission was sketchy.  She states she "can only remember them trying to get me into the ambulance".  She went on to say that "evidently" I had pneumonia and kidneys "shut down".  Prior to admission she described a production of scant sputum which she did not expectorate.  She described being very weak and tired.  She also stated that at admission " I was out of my head".  She stated that she was "sort of supposed to be on oxygen a couple of months ago".  She seemed to confabulate somewhat stating that "I needed a couple of (?) to get me through the day, not enough oxygen".  She seemed to suggest she was not using her oxygen concentrator because her house had fuses and no  breaker box.  She she seemed to indicate that when the oxygen concentrator was plugged in it "made the lights go dim".  She states she did use the portable oxygen "a few times at night".  Her daughter told her she snores but there is no mention of apnea.  She states that she gets dizzy and weak when she engages in PT/OT. ?She describes some postnasal drainage and "some watery eyes".  This been "a little wheezing".She states that she is always anxious and depressed.   ? ?Constitutional: No fever, significant weight change ?Eyes: No redness, discharge, pain, vision change ?ENT/mouth: No  purulent discharge, earache, change in hearing, sore throat  ?Cardiovascular: No chest pain, palpitations, paroxysmal nocturnal dyspnea, claudication  ?Respiratory: No  hemoptysis, significant  apnea  ?Gastrointestinal: No heartburn, dysphagia, abdominal pain, nausea /vomiting, rectal bleeding, melena, change in bowels ?Genitourinary: No dysuria, hematuria, pyuria, incontinence, nocturia ?Musculoskeletal: No joint stiffness, joint swelling, weakness, pain ?Dermatologic: No rash, pruritus, change in appearance of skin ?Neurologic: No headache, syncope, seizures, numbness, tingling ?Psychiatric: No significant  insomnia, anorexia ?Endocrine: No change in hair/skin/nails, excessive thirst, excessive hunger, excessive urination  ?Hematologic/lymphatic: No significant bruising, lymphadenopathy, abnormal bleeding ?Allergy/immunology: No significant sneezing, urticaria, angioedema ? ?Physical exam:  ?Pertinent or positive findings: She is morbidly obese with central weight excess.  She is wearing nasal oxygen.  Eyebrows are decreased laterally.  She has complete dentures.  Breath sounds are profoundly decreased in keeping with emphysematous character.  Heart sounds are also markedly distant.  Abdomen is protuberant.  She has 1/2+ edema of the right lower extremity and trace in the left lower extremity.  Pedal pulses are decreased.  The left  great toenail is deformed.  There is a small boss over the central forehead with associated subcutaneous tissue excess. ? ?General appearance: no acute distress, increased work of breathing is present.   ?Lymphatic: No lymphadenopathy about the head, neck, axilla. ?Eyes: No conjunctival inflammation or lid edema is present. There is no scleral icterus. ?Ears:  External ear exam shows no significant lesions or deformities.   ?Nose:  External nasal examination shows no deformity or inflammation. Nasal mucosa are pink and moist without lesions, exudates ?Oral exam: There is no oropharyngeal erythema or exudate. ?Neck:  No thyromegaly, masses, tenderness noted.    ?Heart:  No gallop, murmur, click, rub.  ?Lungs:  without wheezes, rhonchi, rales, rubs. ?Abdomen: Bowel sounds are normal.  Abdomen is soft and nontender with no organomegaly, hernias, masses. ?GU: Deferred  ?Extremities:  No cyanosis, clubbing ?Neurologic exam: Balance, Rhomberg, finger to nose testing could not be completed due to clinical state ?Skin: Warm & dry w/o tenting. ?No significant lesions or rash. ? ?See clinical summary under each active problem in the Problem List with associated updated therapeutic plan ? ?

## 2021-12-04 ENCOUNTER — Other Ambulatory Visit: Payer: Self-pay | Admitting: Nurse Practitioner

## 2021-12-11 ENCOUNTER — Other Ambulatory Visit: Payer: Self-pay | Admitting: Nurse Practitioner

## 2021-12-11 DIAGNOSIS — F3342 Major depressive disorder, recurrent, in full remission: Secondary | ICD-10-CM

## 2021-12-11 DIAGNOSIS — E7800 Pure hypercholesterolemia, unspecified: Secondary | ICD-10-CM

## 2021-12-11 DIAGNOSIS — I10 Essential (primary) hypertension: Secondary | ICD-10-CM

## 2021-12-12 VITALS — Wt 203.0 lb

## 2021-12-12 NOTE — Progress Notes (Signed)
She is in [facility] in [place]; thinks she will be discharged in a week or 2 - advised to call for f/u appt (TCM) then.  This AWV was started before I realized this. Please disregard and reschedule.

## 2021-12-15 ENCOUNTER — Other Ambulatory Visit: Payer: Self-pay | Admitting: Nurse Practitioner

## 2021-12-15 DIAGNOSIS — F3342 Major depressive disorder, recurrent, in full remission: Secondary | ICD-10-CM

## 2021-12-15 DIAGNOSIS — E7800 Pure hypercholesterolemia, unspecified: Secondary | ICD-10-CM

## 2021-12-16 ENCOUNTER — Encounter: Payer: Self-pay | Admitting: Adult Health

## 2021-12-16 ENCOUNTER — Telehealth: Payer: Self-pay | Admitting: Nurse Practitioner

## 2021-12-16 ENCOUNTER — Non-Acute Institutional Stay (SKILLED_NURSING_FACILITY): Payer: Medicare HMO | Admitting: Adult Health

## 2021-12-16 DIAGNOSIS — F411 Generalized anxiety disorder: Secondary | ICD-10-CM

## 2021-12-16 DIAGNOSIS — G2581 Restless legs syndrome: Secondary | ICD-10-CM

## 2021-12-16 DIAGNOSIS — I2510 Atherosclerotic heart disease of native coronary artery without angina pectoris: Secondary | ICD-10-CM | POA: Diagnosis not present

## 2021-12-16 DIAGNOSIS — F3342 Major depressive disorder, recurrent, in full remission: Secondary | ICD-10-CM

## 2021-12-16 DIAGNOSIS — I509 Heart failure, unspecified: Secondary | ICD-10-CM

## 2021-12-16 DIAGNOSIS — E1122 Type 2 diabetes mellitus with diabetic chronic kidney disease: Secondary | ICD-10-CM

## 2021-12-16 DIAGNOSIS — I10 Essential (primary) hypertension: Secondary | ICD-10-CM | POA: Diagnosis not present

## 2021-12-16 DIAGNOSIS — Z720 Tobacco use: Secondary | ICD-10-CM | POA: Diagnosis not present

## 2021-12-16 DIAGNOSIS — J441 Chronic obstructive pulmonary disease with (acute) exacerbation: Secondary | ICD-10-CM

## 2021-12-16 DIAGNOSIS — J9601 Acute respiratory failure with hypoxia: Secondary | ICD-10-CM | POA: Diagnosis not present

## 2021-12-16 DIAGNOSIS — N182 Chronic kidney disease, stage 2 (mild): Secondary | ICD-10-CM

## 2021-12-16 DIAGNOSIS — I2583 Coronary atherosclerosis due to lipid rich plaque: Secondary | ICD-10-CM

## 2021-12-16 NOTE — Progress Notes (Unsigned)
Location:  [facility] Room Number: 116A Place of Service:  SNF (31) Provider:  Durenda Age, DNP, FNP-BC  Patient Care Team: [name], FNP as PCP - General (Family Medicine) [name], MD as PCP - Cardiology (Cardiology) Nat Christen, MD as Attending Physician (Optometry) Ilean China, RN as Case Manager Forrest, [name], [name] as Social Worker (Licensed Clinical Social Worker) Johney Frame, Greer Ee, MD as Consulting Physician (Cardiology) Steffanie Rainwater, DPM as Consulting Physician (Podiatry)  Extended Emergency Contact Information Primary Emergency Contact: Benge,Brandon Mobile Phone: [phone] Relation: Relative Preferred language: English Interpreter needed? No Secondary Emergency Contact: Ilona Sorrel [place] Phone: [phone] Mobile Phone: [phone] Relation: Daughter  Code Status:  FULL CODE  Goals of care: Advanced Directive information    12/16/2021    4:46 PM  Advanced Directives  Does Patient Have a Medical Advance Directive? No  Does patient want to make changes to medical advance directive? No - Patient declined     Chief Complaint  Patient presents with   Discharge Note    For discharge home on 12/17/21 with Home health PT, OT and Nursing    HPI:  Pt is a 66 y.o. female who is for discharge home with home on 12/17/2021 with home health PT, OT and Nurse.  She was admitted to [facility] on 25/04/23 post hospital admission 10/26/21 to 11/27/21.  She has a PMH of CAD s/p PCI, hypertension, hyperlipidemia, COPD and chronic kidney disease stage IIIa.  She was brought to [facility] ED due to altered mental status and felt to have acute metabolic encephalopathy/septic shock due to pneumonia.  She was intubated on 11/15/2021 and was transferred to [facility].  CT head was negative for acute intracranial abnormality.  Chest x-ray showed  increased opacity in the left lower lobe.  CT chest/abdomen/pelvis showed extensive left-sided pneumonia and thyroid nodules.  She was extubated on 11/17/2021.  She completed 7 days of antimicrobial therapy on 11/21/2021.  Encephalopathy resolved and was discharged on O2 at 3 L/minute via [place] continuously.  She was transferred to the ED on 11/29/21 from [facility] and was treated for acute on chronic hypoxic respiratory failure secondary to acute COPD exacerbation.  She required BiPAP in the beginning.  She was then weaned down and back to 3 L of oxygen.  She was discharged back to [facility] on 11/30/21. Patient was admitted to this facility for short-term rehabilitation after the patient's recent hospitalization.  Patient has completed SNF rehabilitation and therapy has cleared the patient for discharge.   Past Medical History:  Diagnosis Date   Anxiety    Breast cancer ([place]) 06/30/11   inv ductal, ER/PR +, her-2 -   CAD (coronary artery disease)    NSTEMI 4/08: OM3 occluded (PCI unsuccessful), dRCA 95% => BMS, inf HK, EF 50%;   b. MV 11/12:  [place] ischemia => c.  [place] 11/12:  dLAD 70%, OM1 70-80%, then occluded (no change from 2008), dOM filled L->L collats, mRCA stent occluded, dRCA filled L->R collats, EF 55-65% => med Rx (consider PCI of RCA if refractory angina)   Carotid stenosis    dopplers 5/08: 0-30% bilateral; 10/12: 0-39% B/L ICA => f/u 04/2013   Chronic kidney disease    COPD (chronic obstructive pulmonary disease) (HCC)    Depression    Depression with anxiety    GERD (gastroesophageal reflux disease)    OTC acid reducer prn   Headache(784.0)  sinus; occ. migraines   Hx of radiation therapy 08/24/11 to 10/07/11   L breast   Hyperlipidemia    Hypertension    under control; has been on med. x 4 yrs.   Myocardial infarction ([place])    Stable angina ([place])    a. med Rx after cath 05/2011   Stress incontinence, female    Past Surgical History:  Procedure Laterality Date   AXILLARY  LYMPH NODE DISSECTION  07/31/2011   Procedure: AXILLARY LYMPH NODE DISSECTION;  Surgeon: [name], MD;  Location: [facility];  Service: General;  Laterality: Left;  left axillary sentinal node biopsy   BREAST LUMPECTOMY Left 06/30/2011   BREAST SURGERY     CARDIAC CATHETERIZATION  11/02/2006; 06/03/2011   CESAREAN SECTION  1987   CORONARY STENT PLACEMENT  11/02/2006   ELBOW SURGERY     right   LEFT HEART CATH AND CORONARY ANGIOGRAPHY N/A 10/07/2020   Procedure: LEFT HEART CATH AND CORONARY ANGIOGRAPHY;  Surgeon: [name], MD;  Location: [facility] CV [facility];  Service: Cardiovascular;  Laterality: N/A;    Allergies  Allergen Reactions   Food Other (See Comments)    Nutmeg= difficulty breathing   Nutmeg Oil (Myristica Oil) Hives   Other Other (See Comments)    Nutmeg= difficulty breathing   Amlodipine Swelling    Outpatient Encounter Medications as of 12/16/2021  Medication Sig   albuterol (PROVENTIL HFA;VENTOLIN HFA) 108 (90 Base) MCG/ACT inhaler Inhale 2 puffs into the lungs every 6 (six) hours as needed for wheezing or shortness of breath.   ALPRAZolam (XANAX) 0.25 MG tablet Take 1 tablet (0.25 mg total) by mouth 3 (three) times daily as needed for anxiety.   aspirin EC 81 MG tablet Take 81 mg by mouth daily. Swallow whole.   atorvastatin (LIPITOR) 80 MG tablet TAKE 1 TABLET (80 MG TOTAL) BY MOUTH AT BEDTIME.   Cholecalciferol (VITAMIN D3) 5000 units TABS Take 10,000 Units by mouth every evening.   escitalopram (LEXAPRO) 20 MG tablet TAKE 1 TABLET (20 MG TOTAL) BY MOUTH DAILY.   fish oil-omega-3 fatty acids 1000 MG capsule Take 1,000 mg by mouth at bedtime.   furosemide (LASIX) 40 MG tablet Take 1 tablet (40 mg total) by mouth daily.   ipratropium-albuterol (DUONEB) 0.5-2.5 (3) MG/3ML SOLN Take 3 mLs by nebulization every 4 (four) hours as needed.   metFORMIN (GLUCOPHAGE) 500 MG tablet Take 1 tablet (500 mg total) by mouth 2 (two) times daily with a meal.    metoprolol tartrate (LOPRESSOR) 25 MG tablet Take 1 tablet (25 mg total) by mouth 2 (two) times daily.   nicotine (NICODERM CQ - DOSED IN MG/24 HOURS) 21 mg/24hr patch Place 21 mg onto the skin daily.   nitroGLYCERIN (NITROSTAT) 0.4 MG SL tablet Place 1 tablet (0.4 mg total) under the tongue every 5 (five) minutes as needed for chest pain.   potassium chloride SA (KLOR-CON M) 20 MEQ tablet Take 1 tablet (20 mEq total) by mouth daily.   pramipexole (MIRAPEX) 1 MG tablet Take 1 tablet (1 mg total) by mouth in the morning and at bedtime.   vitamin B-12 (CYANOCOBALAMIN) 1000 MCG tablet Take 1,000 mcg by mouth daily.   umeclidinium bromide (INCRUSE ELLIPTA) 62.5 MCG/ACT AEPB Inhale 1 puff into the lungs daily.   [DISCONTINUED] Alcohol Swabs (B-D SINGLE USE SWABS REGULAR) PADS check blood sugars twice daily Dx E11.9   [DISCONTINUED] Blood Glucose Calibration (TRUE METRIX LEVEL 1) Low SOLN USE AS DIRECTED   [  DISCONTINUED] Blood Glucose Monitoring Suppl (TRUE METRIX AIR GLUCOSE METER) w/Device KIT check blood sugars twice daily Dx E11.9   [DISCONTINUED] Budeson-Glycopyrrol-Formoterol (BREZTRI AEROSPHERE) 160-9-4.8 MCG/ACT AERO Inhale 2 puffs into the lungs in the morning and at bedtime.   [DISCONTINUED] glucose blood (TRUE METRIX BLOOD GLUCOSE TEST) test strip TEST BLOOD SUGAR TWICE DAILY   [DISCONTINUED] hydrOXYzine (VISTARIL) 25 MG capsule Take 1 capsule by mouth three times daily as needed   [DISCONTINUED] TRUEplus Lancets 33G MISC USE TO CHECK BLOOD SUGAR TWICE DAILY   No facility-administered encounter medications on file as of 12/16/2021.    Review of Systems  Constitutional:  Negative for appetite change, chills, fatigue and fever.  HENT:  Negative for congestion, hearing loss, rhinorrhea and sore throat.   Eyes: Negative.   Respiratory:  Negative for cough, shortness of breath and wheezing.   Cardiovascular:  Negative for chest pain, palpitations and leg swelling.  Gastrointestinal:  Negative  for abdominal pain, constipation, diarrhea, nausea and vomiting.  Genitourinary:  Negative for dysuria.  Musculoskeletal:  Negative for arthralgias, back pain and myalgias.  Skin:  Negative for color change, rash and wound.  Neurological:  Negative for dizziness, weakness and headaches.  Psychiatric/Behavioral:  Negative for behavioral problems. The patient is not nervous/anxious.       Immunization History  Administered Date(s) Administered   Tdap 05/17/2001   Pertinent  Health Maintenance Due  Topic Date Due   PAP SMEAR-Modifier  05/24/2004   URINE MICROALBUMIN  03/26/2021   OPHTHALMOLOGY EXAM  05/06/2021   DEXA SCAN  08/22/2021   LIPID PANEL  01/09/2022   INFLUENZA VACCINE  02/24/2022   FOOT EXAM  04/17/2022   HEMOGLOBIN A1C  05/18/2022   MAMMOGRAM  12/19/2022   COLONOSCOPY (Pts 45-75yr Insurance coverage will need to be confirmed)  Discontinued      11/26/2021    8:00 AM 11/26/2021    7:34 PM 11/27/2021    9:00 AM 11/30/2021    3:00 AM 11/30/2021   12:00 PM  Fall Risk  Patient Fall Risk Level High fall risk High fall risk High fall risk High fall risk High fall risk     Vitals:   12/16/21 1654  BP: 110/71  Pulse: 83  Resp: 20  Temp: (!) 97.1 F (36.2 C)  Weight: 202 lb 6.4 oz (91.8 kg)  Height: 5' 5"  (1.651 m)   Body mass index is 33.68 kg/m.  Physical Exam Constitutional:      General: She is not in acute distress.    Appearance: She is obese.  HENT:     Head: Normocephalic and atraumatic.     Nose: Nose normal.     Mouth/Throat:     Mouth: Mucous membranes are moist.  Eyes:     Conjunctiva/sclera: Conjunctivae normal.  Cardiovascular:     Rate and Rhythm: Normal rate and regular rhythm.  Pulmonary:     Effort: Pulmonary effort is normal.     Breath sounds: Normal breath sounds.  Abdominal:     General: Bowel sounds are normal.     Palpations: Abdomen is soft.  Musculoskeletal:        General: Normal range of motion.     Cervical back: Normal range  of motion.  Skin:    General: Skin is warm and dry.  Neurological:     General: No focal deficit present.     Mental Status: She is alert and oriented to person, place, and time.  Psychiatric:  Mood and Affect: Mood normal.        Behavior: Behavior normal.        Thought Content: Thought content normal.        Judgment: Judgment normal.       Labs reviewed: Recent Labs    11/18/21 0045 11/19/21 0143 11/20/21 0157 11/23/21 0456 11/24/21 7096 11/25/21 0148 11/26/21 0139 11/27/21 0208 11/29/21 1918 11/30/21 0036  NA 145 144   < > 142 142   < > 141 140 140 138  K 4.2 4.2   < > 3.7 3.5   < > 3.6 3.5 3.9 3.4*  CL 110 108   < > 96* 90*   < > 94* 97* 103 103  CO2 31 31   < > 40* 43*   < > 38* 36* 26 20*  GLUCOSE 98 100*   < > 95 110*   < > 118* 145* 164* 332*  BUN 61* 35*   < > 11 11   < > 12 11 13 15   CREATININE 1.62* 1.11*   < > 0.85 0.92   < > 0.93 1.02* 1.14* 1.29*  CALCIUM 8.1* 8.1*   < > 8.3* 8.3*   < > 9.1 8.9 8.9 8.9  MG 1.8  --   --  1.5* 1.7  --  1.8  --   --   --   PHOS 4.4 3.1  --   --   --   --   --   --   --   --    < > = values in this interval not displayed.   Recent Labs    11/22/21 0112 11/23/21 0456 11/29/21 1918  AST 16 16 24   ALT 160* 104* 41  ALKPHOS 58 45 58  BILITOT 0.6 0.9 0.8  PROT 5.1* 4.8* 5.9*  ALBUMIN 2.0* 1.8* 2.7*   Recent Labs    10/09/21 1302 11/15/21 2325 11/16/21 1136 11/22/21 0112 11/29/21 1918 11/30/21 0036  WBC 13.5* 17.2*   < > 13.4* 11.7* 12.3*  NEUTROABS 11.8* 15.8*  --   --  9.5*  --   HGB 14.8 13.3   < > 10.8* 10.8* 10.6*  HCT 47.9* 46.9*   < > 37.5 38.1 36.9  MCV 82 85.0   < > 84.8 85.6 83.9  PLT 235 PLATELET CLUMPS NOTED ON SMEAR, COUNT APPEARS ADEQUATE   < > 127* 288 292   < > = values in this interval not displayed.   Lab Results  Component Value Date   TSH 2.044 11/18/2021   Lab Results  Component Value Date   HGBA1C 7.3 (H) 11/16/2021   Lab Results  Component Value Date   CHOL 90 (L)  10/09/2021   HDL 28 (L) 10/09/2021   LDLCALC 43 10/09/2021   LDLDIRECT 100.0 05/16/2007   TRIG 99 10/09/2021   CHOLHDL 3.2 10/09/2021    Significant Diagnostic Results in last 30 days:  CT Angio Chest Pulmonary Embolism (PE) W or WO Contrast  Result Date: 11/25/2021 CLINICAL DATA:  Pulmonary embolism, coronary artery disease post MI, remote breast cancer, hypertension EXAM: CT ANGIOGRAPHY CHEST WITH CONTRAST TECHNIQUE: Multidetector CT imaging of the chest was performed using the standard protocol during bolus administration of intravenous contrast. Multiplanar CT image reconstructions and MIPs were obtained to evaluate the vascular anatomy. RADIATION DOSE REDUCTION: This exam was performed according to the departmental dose-optimization program which includes automated exposure control, adjustment of the mA and/or kV according to patient size and/or  use of iterative reconstruction technique. CONTRAST:  50m OMNIPAQUE IOHEXOL 350 MG/ML SOLN IV COMPARISON:  11/16/2021 FINDINGS: Cardiovascular: Atherosclerotic calcifications aorta and coronary arteries as well as proximal great vessels. Aorta normal caliber without aneurysm or dissection. Cardiac chambers minimally enlarged. No pericardial effusion. Pulmonary arteries adequately opacified and patent. No evidence of pulmonary embolism. Mediastinum/Nodes: BILATERAL thyroid nodules 1.8 cm bilaterally; Recommend thyroid [place](ref: J Am Coll Radiol. 2015 Feb;12(2): 143-50). Base of cervical region otherwise normal appearance. Esophagus unremarkable. Few normal size mediastinal lymph nodes without thoracic adenopathy. Lungs/Pleura: Small BILATERAL pleural effusions with compressive atelectasis BILATERAL lower lobes. Emphysematous changes. Mild atelectasis at bases of RIGHT middle lobe and lingula as well. No definite pulmonary infiltrate or pneumothorax. Upper Abdomen: Unremarkable Musculoskeletal: Unremarkable Review of the MIP images confirms the above findings.  IMPRESSION: No evidence of pulmonary embolism. Scattered atherosclerotic calcifications including coronary arteries. Emphysematous changes with small BILATERAL pleural effusions and bibasilar atelectasis. BILATERAL thyroid nodules 1.8 cm diameter; follow-up thyroid ultrasound recommended. Aortic Atherosclerosis (ICD10-I70.0) and Emphysema (ICD10-J43.9). Electronically Signed   By: MLavonia DanaM.D.   On: 11/25/2021 10:59   DG Chest Portable 1 View  Result Date: 11/29/2021 CLINICAL DATA:  Shortness of breath. EXAM: PORTABLE CHEST 1 VIEW COMPARISON:  Nov 25, 2021 FINDINGS: The cardiac silhouette is enlarged and unchanged in size. Diffuse, chronic appearing increased interstitial lung markings are seen. Mild atelectasis is seen within the bilateral lung bases. There is no evidence of a pleural effusion or pneumothorax. Degenerative changes are noted throughout the thoracic spine. IMPRESSION: Chronic appearing increased interstitial lung markings with mild bibasilar atelectasis. Electronically Signed   By: TVirgina NorfolkM.D.   On: 11/29/2021 20:23   DG Chest Port 1 View  Result Date: 11/25/2021 CLINICAL DATA:  Shortness of breath.  History of COPD. EXAM: PORTABLE CHEST 1 VIEW COMPARISON:  November 21, 2021. FINDINGS: Similar bibasilar opacities. No visible pneumothorax. Similar enlargement of the cardiac silhouette. No displaced fracture. IMPRESSION: 1. Similar bibasilar opacities, potentially combination of small layering pleural effusions and overlying atelectasis and/or consolidation. 2. Similar cardiomegaly. Electronically Signed   By: FMargaretha SheffieldM.D.   On: 11/25/2021 08:13   DG Chest Port 1 View  Result Date: 11/21/2021 CLINICAL DATA:  Shortness of breath. EXAM: PORTABLE CHEST 1 VIEW COMPARISON:  11/18/2021 FINDINGS: The heart is enlarged the mediastinal contour is stable. Atherosclerotic calcification of the aorta is noted. The pulmonary vasculature is mildly distended. Interstitial prominence is  present bilaterally. No consolidation, effusion, or pneumothorax. Degenerative changes in the thoracic spine. IMPRESSION: 1. Cardiomegaly with pulmonary vascular congestion. 2. Interstitial prominence bilaterally, possible edema or infiltrate. Electronically Signed   By: LBrett FairyM.D.   On: 11/21/2021 05:03   DG Chest Port 1 View  Result Date: 11/18/2021 CLINICAL DATA:  Respiratory failure EXAM: PORTABLE CHEST 1 VIEW COMPARISON:  Chest x-ray dated November 16, 2021 FINDINGS: Cardiac and mediastinal contours are unchanged. Interval removal of ET tube, left IJ line, and enteric tube. Heterogeneous opacities of the left lung, decreased when compared to prior exam. No large pleural effusion or evidence of pneumothorax. IMPRESSION: 1. Interval removal of ET tube, left IJ line, and enteric tube. 2. Heterogeneous opacities of the left lung, decreased when compared to prior exam. Electronically Signed   By: LYetta GlassmanM.D.   On: 11/18/2021 08:12    Assessment/Plan  1. COPD exacerbation (HCC) - umeclidinium bromide (INCRUSE ELLIPTA) 62.5 MCG/ACT AEPB; Inhale 1 puff into the lungs daily.  Dispense: 30 each; Refill: 0  2. Acute respiratory failure with hypoxia (HCC) - ipratropium-albuterol (DUONEB) 0.5-2.5 (3) MG/3ML SOLN; Take 3 mLs by nebulization every 4 (four) hours as needed.  Dispense: 360 mL; Refill: 0 - albuterol (VENTOLIN HFA) 108 (90 Base) MCG/ACT inhaler; Inhale 2 puffs into the lungs every 6 (six) hours as needed for wheezing or shortness of breath.  Dispense: 6.7 g; Refill: 0  3. Chronic congestive heart failure, unspecified heart failure type (HCC) - potassium chloride SA (KLOR-CON M) 20 MEQ tablet; Take 1 tablet (20 mEq total) by mouth daily.  Dispense: 30 tablet; Refill: 0 - furosemide (LASIX) 40 MG tablet; Take 1 tablet (40 mg total) by mouth daily.  Dispense: 30 tablet; Refill: 0  4. Coronary artery disease due to lipid rich plaque - aspirin EC 81 MG tablet; Take 1 tablet (81 mg  total) by mouth daily. Swallow whole.  Dispense: 30 tablet; Refill: 0 - atorvastatin (LIPITOR) 80 MG tablet; Take 1 tablet (80 mg total) by mouth at bedtime.  Dispense: 30 tablet; Refill: 0  5. Type 2 diabetes mellitus with stage 2 chronic kidney disease, without long-term current use of insulin (HCC) - metFORMIN (GLUCOPHAGE) 500 MG tablet; Take 1 tablet (500 mg total) by mouth 2 (two) times daily with a meal.  Dispense: 180 tablet; Refill: 1  6. Restless leg syndrome - pramipexole (MIRAPEX) 1 MG tablet; Take 1 tablet (1 mg total) by mouth in the morning and at bedtime.  Dispense: 60 tablet; Refill: 0  7. Generalized anxiety disorder - ALPRAZolam (XANAX) 0.25 MG tablet; Take 1 tablet (0.25 mg total) by mouth 3 (three) times daily as needed for anxiety.  Dispense: 20 tablet; Refill: 0  8. Primary hypertension - furosemide (LASIX) 40 MG tablet; Take 1 tablet (40 mg total) by mouth daily.  Dispense: 30 tablet; Refill: 0 - metoprolol tartrate (LOPRESSOR) 25 MG tablet; Take 1 tablet (25 mg total) by mouth 2 (two) times daily.  Dispense: 60 tablet; Refill: 0  9. Tobacco use - nicotine (NICODERM CQ - DOSED IN MG/24 HOURS) 21 mg/24hr patch; Place 1 patch (21 mg total) onto the skin daily.  Dispense: 28 patch; Refill: 0  10. Recurrent major depressive disorder, in full remission ([place]) - escitalopram (LEXAPRO) 20 MG tablet; Take 1 tablet (20 mg total) by mouth daily.  Dispense: 30 tablet; Refill: 0     I have filled out patient's discharge paperwork and e-prescribed medications.  Patient will have home health PT, OT and Nursing.  DME provided:  None  Total discharge time: Greater than 30 minutes Greater than 50% was spent and counseling and coordination of care.   Discharge time involved coordination of the discharge process with social worker, nursing staff and therapy department. Medical justification for home health services verified.    Durenda Age, DNP, MSN, FNP-BC [facility] [phone] (Monday-Friday 8:00 a.m. - 5:00 p.m.) [phone] (after hours)

## 2021-12-17 MED ORDER — POTASSIUM CHLORIDE CRYS ER 20 MEQ PO TBCR: 20.0000 meq | EXTENDED_RELEASE_TABLET | Freq: Every day | ORAL | 0 refills | Status: DC

## 2021-12-17 MED ORDER — VITAMIN B-12 1000 MCG PO TABS: 1000.0000 ug | ORAL_TABLET | Freq: Every day | ORAL | 0 refills | Status: AC

## 2021-12-17 MED ORDER — ATORVASTATIN CALCIUM 80 MG PO TABS: 80.0000 mg | ORAL_TABLET | Freq: Every day | ORAL | 0 refills | Status: DC

## 2021-12-17 MED ORDER — IPRATROPIUM-ALBUTEROL 0.5-2.5 (3) MG/3ML IN SOLN: 3.0000 mL | RESPIRATORY_TRACT | 0 refills | Status: DC | PRN

## 2021-12-17 MED ORDER — FUROSEMIDE 40 MG PO TABS: 40.0000 mg | ORAL_TABLET | Freq: Every day | ORAL | 0 refills | Status: DC

## 2021-12-17 MED ORDER — UMECLIDINIUM BROMIDE 62.5 MCG/ACT IN AEPB: 1.0000 | INHALATION_SPRAY | Freq: Every day | RESPIRATORY_TRACT | 0 refills | Status: DC

## 2021-12-17 MED ORDER — METFORMIN HCL 500 MG PO TABS: 500.0000 mg | ORAL_TABLET | Freq: Two times a day (BID) | ORAL | 1 refills | Status: DC

## 2021-12-17 MED ORDER — NICOTINE 21 MG/24HR TD PT24: 21.0000 mg | MEDICATED_PATCH | Freq: Every day | TRANSDERMAL | 0 refills | Status: DC

## 2021-12-17 MED ORDER — ALPRAZOLAM 0.25 MG PO TABS: 0.2500 mg | ORAL_TABLET | Freq: Three times a day (TID) | ORAL | 0 refills | Status: DC | PRN

## 2021-12-17 MED ORDER — METOPROLOL TARTRATE 25 MG PO TABS: 25.0000 mg | ORAL_TABLET | Freq: Two times a day (BID) | ORAL | 0 refills | Status: DC

## 2021-12-17 MED ORDER — PRAMIPEXOLE DIHYDROCHLORIDE 1 MG PO TABS: 1.0000 mg | ORAL_TABLET | Freq: Two times a day (BID) | ORAL | 0 refills | Status: DC

## 2021-12-17 MED ORDER — ALBUTEROL SULFATE HFA 108 (90 BASE) MCG/ACT IN AERS: 2.0000 | INHALATION_SPRAY | Freq: Four times a day (QID) | RESPIRATORY_TRACT | 0 refills | Status: DC | PRN

## 2021-12-17 MED ORDER — ESCITALOPRAM OXALATE 20 MG PO TABS: 20.0000 mg | ORAL_TABLET | Freq: Every day | ORAL | 0 refills | Status: DC

## 2021-12-17 MED ORDER — ASPIRIN 81 MG PO TBEC: 81.0000 mg | DELAYED_RELEASE_TABLET | Freq: Every day | ORAL | 0 refills | Status: AC

## 2021-12-18 ENCOUNTER — Ambulatory Visit: Payer: Medicare HMO | Admitting: Physician Assistant

## 2021-12-19 ENCOUNTER — Encounter: Payer: Self-pay | Admitting: Nurse Practitioner

## 2021-12-19 ENCOUNTER — Ambulatory Visit (INDEPENDENT_AMBULATORY_CARE_PROVIDER_SITE_OTHER): Payer: Medicare HMO | Admitting: Nurse Practitioner

## 2021-12-19 VITALS — BP 127/69 | HR 75 | Temp 98.1°F | Resp 20 | Ht 65.0 in | Wt 205.0 lb

## 2021-12-19 DIAGNOSIS — F3342 Major depressive disorder, recurrent, in full remission: Secondary | ICD-10-CM | POA: Diagnosis not present

## 2021-12-19 DIAGNOSIS — I2583 Coronary atherosclerosis due to lipid rich plaque: Secondary | ICD-10-CM

## 2021-12-19 DIAGNOSIS — Z09 Encounter for follow-up examination after completed treatment for conditions other than malignant neoplasm: Secondary | ICD-10-CM | POA: Diagnosis not present

## 2021-12-19 DIAGNOSIS — I10 Essential (primary) hypertension: Secondary | ICD-10-CM | POA: Diagnosis not present

## 2021-12-19 DIAGNOSIS — I509 Heart failure, unspecified: Secondary | ICD-10-CM | POA: Diagnosis not present

## 2021-12-19 DIAGNOSIS — I2510 Atherosclerotic heart disease of native coronary artery without angina pectoris: Secondary | ICD-10-CM | POA: Diagnosis not present

## 2021-12-19 DIAGNOSIS — F5101 Primary insomnia: Secondary | ICD-10-CM

## 2021-12-19 MED ORDER — HYDROXYZINE PAMOATE 25 MG PO CAPS: 25.0000 mg | ORAL_CAPSULE | Freq: Three times a day (TID) | ORAL | 0 refills | Status: DC | PRN

## 2021-12-19 MED ORDER — METOPROLOL TARTRATE 25 MG PO TABS: 25.0000 mg | ORAL_TABLET | Freq: Two times a day (BID) | ORAL | 1 refills | Status: DC

## 2021-12-19 MED ORDER — ATORVASTATIN CALCIUM 80 MG PO TABS: 80.0000 mg | ORAL_TABLET | Freq: Every day | ORAL | 1 refills | Status: DC

## 2021-12-19 MED ORDER — ESCITALOPRAM OXALATE 20 MG PO TABS: 20.0000 mg | ORAL_TABLET | Freq: Every day | ORAL | 1 refills | Status: DC

## 2021-12-19 MED ORDER — POTASSIUM CHLORIDE CRYS ER 20 MEQ PO TBCR: 20.0000 meq | EXTENDED_RELEASE_TABLET | Freq: Every day | ORAL | 1 refills | Status: DC

## 2021-12-19 MED ORDER — FUROSEMIDE 40 MG PO TABS: 40.0000 mg | ORAL_TABLET | Freq: Every day | ORAL | 1 refills | Status: DC

## 2021-12-19 NOTE — Patient Instructions (Signed)

## 2021-12-19 NOTE — Progress Notes (Signed)
Subjective:    Patient ID: [name], female    DOB: [birth year], 66 y.o.   MRN: [id number]   Chief Complaint: nursing home discharge  HPI Patient was admitted to the hospital on 11/29/21 with SOB, COPD exacerbation. Upon discharge then was sent to nursing home for rehab due to sob and weakness. She was dx with pneumonia. She was discharged from there on 12/16/21. She is getting home health, PT and OT at home. She was on lisinopril, carvedilol,imdur and spirolactone but they have been stopped since she was in nursing home. She still has some SOB, but denies chest pain.  BP Readings from Last 3 Encounters:  12/19/21 127/69  12/16/21 110/71  12/01/21 109/65   She had marijuana in her last drug screen and was told can no longer do xanax for her. She says she has not been able to seep without xanax.  Review of Systems  Constitutional:  Negative for diaphoresis.  Eyes:  Negative for pain.  Respiratory:  Negative for shortness of breath.   Cardiovascular:  Negative for chest pain, palpitations and leg swelling.  Gastrointestinal:  Negative for abdominal pain.  Endocrine: Negative for polydipsia.  Skin:  Negative for rash.  Neurological:  Negative for dizziness, weakness and headaches.  Hematological:  Does not bruise/bleed easily.  Psychiatric/Behavioral:  The patient is nervous/anxious.   All other systems reviewed and are negative.     Objective:   Physical Exam Vitals and nursing note reviewed.  Constitutional:      General: She is not in acute distress.    Appearance: Normal appearance. She is well-developed.  Neck:     Vascular: No carotid bruit or JVD.  Cardiovascular:     Rate and Rhythm: Normal rate and regular rhythm.     Heart sounds: Normal heart sounds.  Pulmonary:     Effort: Pulmonary effort is normal. No respiratory distress.     Breath sounds: Normal breath sounds. No wheezing or rales.  Chest:     Chest wall: No tenderness.  Abdominal:     General: Bowel  sounds are normal. There is no distension or abdominal bruit.     Palpations: Abdomen is soft. There is no hepatomegaly, splenomegaly, mass or pulsatile mass.     Tenderness: There is no abdominal tenderness.  Musculoskeletal:        General: Normal range of motion.     Cervical back: Normal range of motion and neck supple.  Lymphadenopathy:     Cervical: No cervical adenopathy.  Skin:    General: Skin is warm and dry.  Neurological:     Mental Status: She is alert and oriented to person, place, and time.     Deep Tendon Reflexes: Reflexes are normal and symmetric.  Psychiatric:        Behavior: Behavior normal.        Thought Content: Thought content normal.        Judgment: Judgment normal.    BP 127/69   Pulse 75   Temp 98.1 F (36.7 C) (Temporal)   Resp 20   Ht _0  (1.651 m)   Wt 205 lb (93 kg)   LMP 10/17/2005   SpO2 94%   BMI 34.11 kg/m        Assessment & Plan:   [name] in today with chief complaint of Nursing home follow up   1. Chronic congestive heart failure, unspecified heart failure type ([place]) Meds refilled - potassium chloride SA (KLOR-CON M)  20 MEQ tablet; Take 1 tablet (20 mEq total) by mouth daily.  Dispense: 90 tablet; Refill: 1 - furosemide (LASIX) 40 MG tablet; Take 1 tablet (40 mg total) by mouth daily.  Dispense: 90 tablet; Refill: 1  2. Primary hypertension Low sodium diet - furosemide (LASIX) 40 MG tablet; Take 1 tablet (40 mg total) by mouth daily.  Dispense: 90 tablet; Refill: 1 - metoprolol tartrate (LOPRESSOR) 25 MG tablet; Take 1 tablet (25 mg total) by mouth 2 (two) times daily.  Dispense: 180 tablet; Refill: 1 - CBC with Differential/Platelet - CMP14+EGFR  3. Coronary artery disease due to lipid rich plaque Need to follow up with cardiology - atorvastatin (LIPITOR) 80 MG tablet; Take 1 tablet (80 mg total) by mouth at bedtime.  Dispense: 90 tablet; Refill: 1  4. Recurrent major depressive disorder, in full remission  ([place]) Stress management - escitalopram (LEXAPRO) 20 MG tablet; Take 1 tablet (20 mg total) by mouth daily.  Dispense: 90 tablet; Refill: 1  5. Primary insomnia Added hydroxyzine - hydrOXYzine (VISTARIL) 25 MG capsule; Take 1 capsule (25 mg total) by mouth every 8 (eight) hours as needed.  Dispense: 30 capsule; Refill: 0  6. Hospital discharge follow-up Hospital records and discharge summary form nursing home reviewed    The above assessment and management plan was discussed with the patient. The patient verbalized understanding of and has agreed to the management plan. Patient is aware to call the clinic if symptoms persist or worsen. Patient is aware when to return to the clinic for a follow-up visit. Patient educated on when it is appropriate to go to the emergency department.   [name], FNP

## 2021-12-23 ENCOUNTER — Telehealth: Payer: Self-pay | Admitting: Nurse Practitioner

## 2021-12-23 DIAGNOSIS — I10 Essential (primary) hypertension: Secondary | ICD-10-CM

## 2021-12-23 DIAGNOSIS — I509 Heart failure, unspecified: Secondary | ICD-10-CM

## 2021-12-23 MED ORDER — FUROSEMIDE 40 MG PO TABS: 40.0000 mg | ORAL_TABLET | Freq: Every day | ORAL | 0 refills | Status: DC

## 2021-12-23 MED ORDER — FUROSEMIDE 40 MG PO TABS: 40.0000 mg | ORAL_TABLET | Freq: Every day | ORAL | 1 refills | Status: DC

## 2021-12-23 NOTE — Telephone Encounter (Signed)
Sent 30 day supply to [facility] and refills to [facility]. Patient notified and verbalized understanding

## 2021-12-23 NOTE — Telephone Encounter (Signed)
Pt says that furosemide (LASIX) 40 MG tablet has not been called in. Use [facility] pharmacy for a month supply and the rest to [facility] order. Please call when done.

## 2021-12-30 ENCOUNTER — Inpatient Hospital Stay: Payer: Medicare HMO | Admitting: Pulmonary Disease

## 2021-12-31 ENCOUNTER — Ambulatory Visit (INDEPENDENT_AMBULATORY_CARE_PROVIDER_SITE_OTHER): Payer: Medicare HMO | Admitting: Pulmonary Disease

## 2021-12-31 ENCOUNTER — Encounter: Payer: Self-pay | Admitting: Pulmonary Disease

## 2021-12-31 VITALS — BP 124/86 | HR 84 | Ht 65.0 in | Wt 207.4 lb

## 2021-12-31 DIAGNOSIS — Z87891 Personal history of nicotine dependence: Secondary | ICD-10-CM | POA: Diagnosis not present

## 2021-12-31 DIAGNOSIS — J432 Centrilobular emphysema: Secondary | ICD-10-CM

## 2021-12-31 NOTE — Patient Instructions (Addendum)
Continue breztri inhaler 2 puffs twice daily - rinse mouth out after each use  Continue albuterol inhaler as needed  I am proud that you have quit smoking, keep up the great work!  Follow up in 3 months

## 2021-12-31 NOTE — Progress Notes (Signed)
Synopsis: Referred in November 2022 for shortness of breath by [name], NP  Subjective:   PATIENT ID: [name] GENDER: female DOB: 03-30-1956, MRN: [id number]  HPI  Chief Complaint  Patient presents with   Hospitalization Follow-up    Pt doing well after hospital d/c. States home oximeter reads 91-94% w/o o2 but still has portable oxygen; still using breztri inhaler, stopped smoking day of hospitalization   [name] is a 66 year old woman, daily smoker with history of breast cancer, coronary artery disease, GERD, hypertension and hyperlipidemia who returns to pulmonary clinic for COPD.   She was recently admitted 4/22 and 5/6 for septic shock due to pneumonia and COPD exacerbations. She was smoking up to admission and has not smoked since discharge. She is feeling better since hospitalizations. She continue on breztri 2 puffs twice daily and as needed albuterol. She is having trouble sleeping now. She is not receiving xanax from her PCP anymore. She is not using nicotine replacement at this time. She has sinus drainage and post-nasal drip  OV 06/17/21 She reports progressive shortness of breath over the past year or two. She has not been sleeping well over this same time period. She has cough with some mucous production and intermittent wheezing. She does report snoring and waking up to go to the bathroom. She is using symbicort 160-4.61mg 2 puffs twice daily and feels like it is getting stuck in her throat.   She is smoking 1 pack per day. She has been smoking for 50 years. She usually smokes 3 cartons per month and has cut back to 2 cartons per month recently.   She is on disability.   Past Medical History:  Diagnosis Date   Anxiety    [place]   inv ductal, ER/PR +, her-2 -   CAD (coronary artery disease)    NSTEMI 4/08: OM3 occluded (PCI unsuccessful), dRCA 95% => BMS, inf HK, EF 50%;   b. MV 11/12:  [place] ischemia => c.  [place]:  dLAD  70%, OM1 70-80%, then occluded (no change from 2008), dOM filled L->L collats, mRCA stent occluded, dRCA filled L->R collats, EF 55-65% => med Rx (consider PCI of RCA if refractory angina)   Carotid stenosis    dopplers 5/08: 0-30% bilateral; 10/12: 0-39% B/L ICA => f/u 04/2013   Chronic kidney disease    COPD (chronic obstructive pulmonary disease) (HCC)    Depression    Depression with anxiety    GERD (gastroesophageal reflux disease)    OTC acid reducer prn   Headache(784.0)    sinus; occ. migraines   Hx of radiation therapy 08/24/11 to 10/07/11   L breast   Hyperlipidemia    Hypertension    under control; has been on med. x 4 yrs.   [place]    [place]ina [place]    a. med Rx after cath 05/2011   Stress incontinence, female      Family History  Problem Relation Age of Onset   Heart disease Mother    Heart disease Father    Cancer Maternal Aunt        pt unaware of what kind   Cancer Maternal Grandmother        ovarian   Heart attack Sister    Thyroid disease Daughter    Anxiety disorder Daughter    Depression Daughter    Bipolar disorder Sister      Social History   Socioeconomic  History   Marital status: Single    Spouse name: Not on file   Number of children: 1   Years of education: 12+   Highest education level: Some college, no degree  Occupational History   Not on file  Tobacco Use   Smoking status: Former    Packs/day: 1.00    Years: 45.00    Pack years: 45.00    Types: Cigarettes    Quit date: 11/13/2021    Years since quitting: 0.1   Smokeless tobacco: Never   Tobacco comments:    on and off  Vaping Use   Vaping Use: Never used  Substance and Sexual Activity   Alcohol use: Yes    Alcohol/week: 1.0 standard drink    Types: 1 Glasses of wine per week    Comment: occasionally   Drug use: No   Sexual activity: Never    Birth control/protection: Post-menopausal  Other Topics Concern   Not on file  Social History Narrative    Lives at home alone. She has a daughter and grandchildren that she interacts with often.    Social Determinants of Health   Financial Resource Strain: Not on file  Food Insecurity: Not on file  Transportation Needs: Not on file  Physical Activity: Not on file  Stress: Not on file  Social Connections: Not on file  Intimate Partner Violence: Not on file     Allergies  Allergen Reactions   Food Other (See Comments)    Nutmeg= difficulty breathing   Nutmeg Oil (Myristica Oil) Hives   Other Other (See Comments)    Nutmeg= difficulty breathing   Amlodipine Swelling     Outpatient Medications Prior to Visit  Medication Sig Dispense Refill   albuterol (VENTOLIN HFA) 108 (90 Base) MCG/ACT inhaler Inhale 2 puffs into the lungs every 6 (six) hours as needed for wheezing or shortness of breath. 6.7 g 0   aspirin EC 81 MG tablet Take 1 tablet (81 mg total) by mouth daily. Swallow whole. 30 tablet 0   atorvastatin (LIPITOR) 80 MG tablet Take 1 tablet (80 mg total) by mouth at bedtime. 90 tablet 1   BREZTRI AEROSPHERE 160-9-4.8 MCG/ACT AERO Inhale 2 puffs into the lungs in the morning and at bedtime.     Cholecalciferol (VITAMIN D3) 5000 units TABS Take 10,000 Units by mouth every evening.     escitalopram (LEXAPRO) 20 MG tablet Take 1 tablet (20 mg total) by mouth daily. 90 tablet 1   fish oil-omega-3 fatty acids 1000 MG capsule Take 1,000 mg by mouth at bedtime.     furosemide (LASIX) 40 MG tablet Take 1 tablet (40 mg total) by mouth daily. 90 tablet 1   hydrOXYzine (VISTARIL) 25 MG capsule Take 1 capsule (25 mg total) by mouth every 8 (eight) hours as needed. 30 capsule 0   metFORMIN (GLUCOPHAGE) 500 MG tablet Take 1 tablet (500 mg total) by mouth 2 (two) times daily with a meal. 180 tablet 1   metoprolol tartrate (LOPRESSOR) 25 MG tablet Take 1 tablet (25 mg total) by mouth 2 (two) times daily. 180 tablet 1   potassium chloride SA (KLOR-CON M) 20 MEQ tablet Take 1 tablet (20 mEq total) by  mouth daily. 90 tablet 1   pramipexole (MIRAPEX) 1 MG tablet Take 1 tablet (1 mg total) by mouth in the morning and at bedtime. 60 tablet 0   vitamin B-12 (CYANOCOBALAMIN) 1000 MCG tablet Take 1 tablet (1,000 mcg total) by mouth daily. 30 tablet  0   ALPRAZolam (XANAX) 0.25 MG tablet Take 1 tablet (0.25 mg total) by mouth 3 (three) times daily as needed for anxiety. (Patient not taking: Reported on 12/31/2021) 20 tablet 0   ipratropium-albuterol (DUONEB) 0.5-2.5 (3) MG/3ML SOLN Take 3 mLs by nebulization every 4 (four) hours as needed. (Patient not taking: Reported on 12/31/2021) 360 mL 0   nicotine (NICODERM CQ - DOSED IN MG/24 HOURS) 21 mg/24hr patch Place 1 patch (21 mg total) onto the skin daily. (Patient not taking: Reported on 12/31/2021) 28 patch 0   nitroGLYCERIN (NITROSTAT) 0.4 MG SL tablet Place 1 tablet (0.4 mg total) under the tongue every 5 (five) minutes as needed for chest pain.  12   umeclidinium bromide (INCRUSE ELLIPTA) 62.5 MCG/ACT AEPB Inhale 1 puff into the lungs daily. 30 each 0   No facility-administered medications prior to visit.   Review of Systems  Constitutional:  Negative for chills, fever, malaise/fatigue and weight loss.  HENT:  Negative for congestion, sinus pain and sore throat.   Eyes: Negative.   Respiratory:  Positive for shortness of breath. Negative for cough, hemoptysis, sputum production and wheezing.   Cardiovascular:  Negative for chest pain, palpitations, orthopnea, claudication and leg swelling.  Gastrointestinal:  Negative for abdominal pain, heartburn, nausea and vomiting.  Genitourinary: Negative.   Musculoskeletal:  Negative for joint pain and myalgias.  Skin:  Negative for rash.  Neurological:  Negative for weakness.  Endo/Heme/Allergies: Negative.   Psychiatric/Behavioral: Negative.     Objective:   Vitals:   12/31/21 1358  BP: 124/86  Pulse: 84  SpO2: 96%  Weight: 207 lb 6.4 oz (94.1 kg)  Height: _0  (1.651 m)   Physical  Exam Constitutional:      General: She is not in acute distress.    Appearance: She is not ill-appearing.  HENT:     Head: Normocephalic and atraumatic.  Eyes:     General: No scleral icterus.    Conjunctiva/sclera: Conjunctivae normal.  Cardiovascular:     Rate and Rhythm: Normal rate and regular rhythm.     Pulses: Normal pulses.     Heart sounds: Normal heart sounds. No murmur heard. Pulmonary:     Effort: Pulmonary effort is normal.     Breath sounds: Decreased air movement present. Decreased breath sounds present. No wheezing, rhonchi or rales.  Musculoskeletal:     Right lower leg: No edema.     Left lower leg: No edema.  Skin:    General: Skin is warm and dry.  Neurological:     General: No focal deficit present.     Mental Status: She is alert.  Psychiatric:        Mood and Affect: Mood normal.        Behavior: Behavior normal.        Thought Content: Thought content normal.        Judgment: Judgment normal.   CBC    Component Value Date/Time   WBC 12.3 (H) 11/30/2021 0036   RBC 4.40 11/30/2021 0036   HGB 10.6 (L) 11/30/2021 0036   HGB 14.8 10/09/2021 1302   HGB 16.8 (H) 12/16/2011 1146   HCT 36.9 11/30/2021 0036   HCT 47.9 (H) 10/09/2021 1302   HCT 49.3 (H) 12/16/2011 1146   PLT 292 11/30/2021 0036   PLT 235 10/09/2021 1302   MCV 83.9 11/30/2021 0036   MCV 82 10/09/2021 1302   MCV 95.4 12/16/2011 1146   MCH 24.1 (L) 11/30/2021 0036  MCHC 28.7 (L) 11/30/2021 0036   RDW 20.6 (H) 11/30/2021 0036   RDW 16.4 (H) 10/09/2021 1302   RDW 14.2 12/16/2011 1146   LYMPHSABS 1.4 11/29/2021 1918   LYMPHSABS 0.8 10/09/2021 1302   LYMPHSABS 1.0 12/16/2011 1146   MONOABS 0.7 11/29/2021 1918   MONOABS 0.6 12/16/2011 1146   EOSABS 0.0 11/29/2021 1918   EOSABS 0.2 10/09/2021 1302   BASOSABS 0.1 11/29/2021 1918   BASOSABS 0.1 10/09/2021 1302   BASOSABS 0.1 12/16/2011 1146      Latest Ref Rng & Units 11/30/2021   12:36 AM 11/29/2021    7:18 PM 11/27/2021    2:08 AM   BMP  Glucose 70 - 99 mg/dL 332   164   145    BUN 8 - 23 mg/dL _0 Creatinine 0.44 - 1.00 mg/dL 1.29   1.14   1.02    Sodium 135 - 145 mmol/L 138   140   140    Potassium 3.5 - 5.1 mmol/L 3.4   3.9   3.5    Chloride 98 - 111 mmol/L 103   103   97    CO2 22 - 32 mmol/L 20   26   36    Calcium 8.9 - 10.3 mg/dL 8.9   8.9   8.9     Chest imaging: CTA Chest 11/25/21 No evidence of pulmonary embolism.   Scattered atherosclerotic calcifications including coronary arteries.   Emphysematous changes with small BILATERAL pleural effusions and bibasilar atelectasis.   BILATERAL thyroid nodules 1.8 cm diameter; follow-up thyroid ultrasound recommended.   Aortic Atherosclerosis  CT Chest LCS 01/16/21 1. Lung-RADS 3, probably benign findings. Short-term follow-up in 6 months is recommended with repeat low-dose chest CT without contrast. 6.9 mm apical right upper lobe nodule and 6.3 mm left lower lobe nodule.  2. Aortic atherosclerosis (ICD10-I70.0). Coronary artery calcification. 3.  Emphysema   PFT:     View : No data to display.          Labs:  Path:  Echo 02/05/21: LVEF 60 to 65%.  LV diastolic parameters are consistent with grade 1 diastolic dysfunction.  Elevated left atrial pressure.  RV systolic function is normal.  RV size is normal.  PASP estimated 35.3 mmHg.  Mild thickening of aortic valve.  Mild to moderate aortic valve sclerosis/calcification.  Heart Catheterization 10/07/20: 1.  Patent left mainstem 2.  Patent LAD, supplies a large territory with 2 diagonal branches and wraps around the LV apex with no obstructive disease throughout its distribution 3.  Patent left circumflex with chronic occlusion of the first OM branch, late filling is seen in previous cardiac catheterization study 4.  Chronic occlusion of the mid RCA with extensive left-to-right collaterals supplied by an epicardial circumflex branch and septal perforating branches of the LAD 5.  Normal  LV function with normal wall motion, no regional wall motion abnormalities.   Recommendations: Favor ongoing medical therapy.  Appears to have stable coronary anatomy from old catheterization 10 years ago.  Will ask CTO specialist to review her films to see if there would be treatment options for refractory angina.  She is already on aggressive medical therapy with amlodipine, isosorbide, and metoprolol.  Assessment & Plan:   Centrilobular emphysema ([place])  Former smoker - Plan: Ambulatory Referral for Lung Cancer Scre  Discussion: [name] is a 66 year old woman, daily smoker with history of breast cancer, coronary artery disease, GERD,  hypertension and hyperlipidemia who returns to pulmonary clinic for COPD.   She has been feeling better since hospital discharge. She is to continue breztri inhaler 2 puffs twice daily and as needed albuterol inhaler.   I have commended her for quitting smoking. We will refer her to lung cancer screening program.   Follow up in 3 months.  Freda Jackson, MD [facility] Office: [phone]   Current Outpatient Medications:    albuterol (VENTOLIN HFA) 108 (90 Base) MCG/ACT inhaler, Inhale 2 puffs into the lungs every 6 (six) hours as needed for wheezing or shortness of breath., Disp: 6.7 g, Rfl: 0   aspirin EC 81 MG tablet, Take 1 tablet (81 mg total) by mouth daily. Swallow whole., Disp: 30 tablet, Rfl: 0   atorvastatin (LIPITOR) 80 MG tablet, Take 1 tablet (80 mg total) by mouth at bedtime., Disp: 90 tablet, Rfl: 1   BREZTRI AEROSPHERE 160-9-4.8 MCG/ACT AERO, Inhale 2 puffs into the lungs in the morning and at bedtime., Disp: , Rfl:    Cholecalciferol (VITAMIN D3) 5000 units TABS, Take 10,000 Units by mouth every evening., Disp: , Rfl:    escitalopram (LEXAPRO) 20 MG tablet, Take 1 tablet (20 mg total) by mouth daily., Disp: 90 tablet, Rfl: 1   fish oil-omega-3 fatty acids 1000 MG capsule, Take 1,000 mg by mouth at bedtime.,  Disp: , Rfl:    furosemide (LASIX) 40 MG tablet, Take 1 tablet (40 mg total) by mouth daily., Disp: 90 tablet, Rfl: 1   hydrOXYzine (VISTARIL) 25 MG capsule, Take 1 capsule (25 mg total) by mouth every 8 (eight) hours as needed., Disp: 30 capsule, Rfl: 0   metFORMIN (GLUCOPHAGE) 500 MG tablet, Take 1 tablet (500 mg total) by mouth 2 (two) times daily with a meal., Disp: 180 tablet, Rfl: 1   metoprolol tartrate (LOPRESSOR) 25 MG tablet, Take 1 tablet (25 mg total) by mouth 2 (two) times daily., Disp: 180 tablet, Rfl: 1   potassium chloride SA (KLOR-CON M) 20 MEQ tablet, Take 1 tablet (20 mEq total) by mouth daily., Disp: 90 tablet, Rfl: 1   pramipexole (MIRAPEX) 1 MG tablet, Take 1 tablet (1 mg total) by mouth in the morning and at bedtime., Disp: 60 tablet, Rfl: 0   vitamin B-12 (CYANOCOBALAMIN) 1000 MCG tablet, Take 1 tablet (1,000 mcg total) by mouth daily., Disp: 30 tablet, Rfl: 0

## 2022-01-08 ENCOUNTER — Encounter: Payer: Self-pay | Admitting: Family Medicine

## 2022-01-08 ENCOUNTER — Ambulatory Visit (INDEPENDENT_AMBULATORY_CARE_PROVIDER_SITE_OTHER): Payer: Medicare HMO | Admitting: Family Medicine

## 2022-01-08 VITALS — BP 118/74 | HR 80 | Temp 98.5°F | Ht 65.0 in | Wt 212.0 lb

## 2022-01-08 DIAGNOSIS — J441 Chronic obstructive pulmonary disease with (acute) exacerbation: Secondary | ICD-10-CM | POA: Diagnosis not present

## 2022-01-08 MED ORDER — PREDNISONE 20 MG PO TABS: 40.0000 mg | ORAL_TABLET | Freq: Every day | ORAL | 0 refills | Status: AC

## 2022-01-08 MED ORDER — GUAIFENESIN ER 600 MG PO TB12: 600.0000 mg | ORAL_TABLET | Freq: Two times a day (BID) | ORAL | 0 refills | Status: AC

## 2022-01-08 MED ORDER — LEVOFLOXACIN 500 MG PO TABS: 500.0000 mg | ORAL_TABLET | Freq: Every day | ORAL | 0 refills | Status: AC

## 2022-01-08 NOTE — Progress Notes (Signed)
Subjective:  Patient ID: [name], female    DOB: [birth year], 66 y.o.   MRN: [id number]  Patient Care Team: [name], FNP as PCP - General (Family Medicine) [name], MD as PCP - Cardiology (Cardiology) Nat Christen, MD as Attending Physician (Optometry) Ilean China, RN as Case Manager [place], [name], [name] as Social Worker (Licensed Clinical Social Worker) Johney Frame, Greer Ee, MD as Consulting Physician (Cardiology) Steffanie Rainwater, DPM as Consulting Physician (Podiatry)   Chief Complaint:  COPD   HPI: [name] is a 66 y.o. female presenting on 01/08/2022 for COPD   Pt presents today with complaints of increased cough, congestion, shortness of breath, and sputum production. She has had multiple hospitalizations this year due to COPD exacerbations, pneumonia, septic shock, and respiratory failure. She was discharged home from the SNF last month and states she has been doing fairly well. States over the last 7-10 days she has had increased symptoms and has been requiring her rescue inhaler more than normal. She denies use of Mucinex to help with sputum. No fever, chills, weakness, or confusion.      Relevant past medical, surgical, family, and social history reviewed and updated as indicated.  Allergies and medications reviewed and updated. Data reviewed: Chart in Epic.   Past Medical History:  Diagnosis Date   Anxiety    Breast cancer ([place]) 06/30/11   inv ductal, ER/PR +, her-2 -   CAD (coronary artery disease)    NSTEMI 4/08: OM3 occluded (PCI unsuccessful), dRCA 95% => BMS, inf HK, EF 50%;   b. MV 11/12:  [place] ischemia => c.  [place] 11/12:  dLAD 70%, OM1 70-80%, then occluded (no change from 2008), dOM filled L->L collats, mRCA stent occluded, dRCA filled L->R collats, EF 55-65% => med Rx (consider PCI of RCA if refractory angina)   Carotid stenosis    dopplers 5/08: 0-30% bilateral; 10/12: 0-39% B/L ICA => f/u 04/2013   Chronic  kidney disease    COPD (chronic obstructive pulmonary disease) (HCC)    Depression    Depression with anxiety    GERD (gastroesophageal reflux disease)    OTC acid reducer prn   Headache(784.0)    sinus; occ. migraines   Hx of radiation therapy 08/24/11 to 10/07/11   L breast   Hyperlipidemia    Hypertension    under control; has been on med. x 4 yrs.   Myocardial infarction ([place])    Stable angina ([place])    a. med Rx after cath 05/2011   Stress incontinence, female     Past Surgical History:  Procedure Laterality Date   AXILLARY LYMPH NODE DISSECTION  07/31/2011   Procedure: AXILLARY LYMPH NODE DISSECTION;  Surgeon: [name], MD;  Location: [facility];  Service: General;  Laterality: Left;  left axillary sentinal node biopsy   BREAST LUMPECTOMY Left 06/30/2011   BREAST SURGERY     CARDIAC CATHETERIZATION  11/02/2006; 06/03/2011   CESAREAN SECTION  1987   CORONARY STENT PLACEMENT  11/02/2006   ELBOW SURGERY     right   LEFT HEART CATH AND CORONARY ANGIOGRAPHY N/A 10/07/2020   Procedure: LEFT HEART CATH AND CORONARY ANGIOGRAPHY;  Surgeon: [name], MD;  Location: [facility];  Service: Cardiovascular;  Laterality: N/A;    Social History   Socioeconomic History   Marital status: Single    Spouse name: Not on file   Number of children: 1  Years of education: 12+   Highest education level: Some college, no degree  Occupational History   Not on file  Tobacco Use   Smoking status: Former    Packs/day: 1.00    Years: 45.00    Total pack years: 45.00    Types: Cigarettes    Quit date: 11/13/2021    Years since quitting: 0.1   Smokeless tobacco: Never   Tobacco comments:    on and off  Vaping Use   Vaping Use: Never used  Substance and Sexual Activity   Alcohol use: Yes    Alcohol/week: 1.0 standard drink of alcohol    Types: 1 Glasses of wine per week    Comment: occasionally   Drug use: No   Sexual activity: Never    Birth  control/protection: Post-menopausal  Other Topics Concern   Not on file  Social History Narrative   Lives at home alone. She has a daughter and grandchildren that she interacts with often.    Social Determinants of Health   Financial Resource Strain: Medium Risk (12/11/2020)   Overall Financial Resource Strain (CARDIA)    Difficulty of Paying Living Expenses: Somewhat hard  Food Insecurity: Food Insecurity Present (12/11/2020)   Hunger Vital Sign    Worried About Running Out of Food in the Last Year: Sometimes true    Ran Out of Food in the Last Year: Sometimes true  Transportation Needs: Not on file  Physical Activity: Inactive (02/11/2018)   Exercise Vital Sign    Days of Exercise per Week: 0 days    Minutes of Exercise per Session: 0 min  Stress: Stress Concern Present (12/11/2020)   [place]    Feeling of Stress : Very much  Social Connections: Moderately Isolated (12/11/2020)   Social Connection and Isolation Panel [NHANES]    Frequency of Communication with Friends and Family: Twice a week    Frequency of Social Gatherings with Friends and Family: Once a week    Attends Religious Services: 1 to 4 times per year    Active Member of [employer] or Organizations: No    Attends Archivist Meetings: Never    Marital Status: Never married  Intimate Partner Violence: Not on file    Outpatient Encounter Medications as of 01/08/2022  Medication Sig   albuterol (VENTOLIN HFA) 108 (90 Base) MCG/ACT inhaler Inhale 2 puffs into the lungs every 6 (six) hours as needed for wheezing or shortness of breath.   aspirin EC 81 MG tablet Take 1 tablet (81 mg total) by mouth daily. Swallow whole.   atorvastatin (LIPITOR) 80 MG tablet Take 1 tablet (80 mg total) by mouth at bedtime.   BREZTRI AEROSPHERE 160-9-4.8 MCG/ACT AERO Inhale 2 puffs into the lungs in the morning and at bedtime.   Cholecalciferol (VITAMIN D3) 5000 units  TABS Take 10,000 Units by mouth every evening.   escitalopram (LEXAPRO) 20 MG tablet Take 1 tablet (20 mg total) by mouth daily.   fish oil-omega-3 fatty acids 1000 MG capsule Take 1,000 mg by mouth at bedtime.   furosemide (LASIX) 40 MG tablet Take 1 tablet (40 mg total) by mouth daily.   guaiFENesin (MUCINEX) 600 MG 12 hr tablet Take 1 tablet (600 mg total) by mouth 2 (two) times daily.   hydrOXYzine (VISTARIL) 25 MG capsule Take 1 capsule (25 mg total) by mouth every 8 (eight) hours as needed.   levofloxacin (LEVAQUIN) 500 MG tablet Take 1 tablet (500  mg total) by mouth daily for 7 days.   metFORMIN (GLUCOPHAGE) 500 MG tablet Take 1 tablet (500 mg total) by mouth 2 (two) times daily with a meal.   metoprolol tartrate (LOPRESSOR) 25 MG tablet Take 1 tablet (25 mg total) by mouth 2 (two) times daily.   potassium chloride SA (KLOR-CON M) 20 MEQ tablet Take 1 tablet (20 mEq total) by mouth daily.   pramipexole (MIRAPEX) 1 MG tablet Take 1 tablet (1 mg total) by mouth in the morning and at bedtime.   predniSONE (DELTASONE) 20 MG tablet Take 2 tablets (40 mg total) by mouth daily with breakfast for 5 days.   vitamin B-12 (CYANOCOBALAMIN) 1000 MCG tablet Take 1 tablet (1,000 mcg total) by mouth daily.   No facility-administered encounter medications on file as of 01/08/2022.    Allergies  Allergen Reactions   Food Other (See Comments)    Nutmeg= difficulty breathing   Nutmeg Oil ([place]rist[place] Oil) Hives   Other Other (See Comments)    Nutmeg= difficulty breathing   Amlodipine Swelling    Review of Systems  Constitutional:  Positive for activity change. Negative for appetite change, chills, diaphoresis, fatigue, fever and unexpected weight change.  HENT:  Positive for congestion.   Respiratory:  Positive for cough, chest tightness, shortness of breath and wheezing.   Cardiovascular:  Positive for leg swelling. Negative for chest pain and palpitations.  Gastrointestinal:  Negative for abdominal  pain, diarrhea, nausea and vomiting.  Genitourinary:  Negative for decreased urine volume and difficulty urinating.  Neurological:  Negative for dizziness and weakness.  Psychiatric/Behavioral:  Positive for sleep disturbance. Negative for agitation, behavioral problems, confusion, decreased concentration, dysphoric mood, hallucinations, self-injury and suicidal ideas. The patient is not nervous/anxious and is not hyperactive.   All other systems reviewed and are negative.       Objective:  BP 118/74   Pulse 80   Temp 98.5 F (36.9 C)   Ht 5' 5" (1.651 m)   Wt 212 lb (96.2 kg)   LMP 10/17/2005   SpO2 94%   BMI 35.28 kg/m    Wt Readings from Last 3 Encounters:  01/08/22 212 lb (96.2 kg)  12/31/21 207 lb 6.4 oz (94.1 kg)  12/19/21 205 lb (93 kg)    Physical Exam Vitals and nursing note reviewed.  Constitutional:      General: She is not in acute distress.    Appearance: Normal appearance. She is well-developed and well-groomed. She is obese. She is not ill-appearing, toxic-appearing or diaphoretic.  HENT:     Head: Normocephalic and atraumatic.     Jaw: There is normal jaw occlusion.     Right Ear: Hearing normal.     Left Ear: Hearing normal.     Nose: Nose normal.     Mouth/Throat:     Lips: Pink.     Mouth: Mucous membranes are moist.     Pharynx: Oropharynx is clear. Uvula midline.  Eyes:     General: Lids are normal.     Pupils: Pupils are equal, round, and reactive to light.  Neck:     Thyroid: No thyroid mass, thyromegaly or thyroid tenderness.     Vascular: No carotid bruit or JVD.     Trachea: Trachea and phonation normal.  Cardiovascular:     Rate and Rhythm: Normal rate and regular rhythm.     Chest Wall: PMI is not displaced.     Pulses: Normal pulses.     Heart sounds: Normal  heart sounds. No murmur heard.    No friction rub. No gallop.  Pulmonary:     Effort: Pulmonary effort is normal. No respiratory distress.     Breath sounds: Wheezing and  rhonchi present. No rales.  Abdominal:     General: There is no abdominal bruit.     Palpations: Abdomen is soft. There is no hepatomegaly or splenomegaly.  Musculoskeletal:        General: Normal range of motion.     Cervical back: Normal range of motion and neck supple.     Right lower leg: Edema present.     Left lower leg: Edema present.  Lymphadenopathy:     Cervical: No cervical adenopathy.  Skin:    General: Skin is warm and dry.     Capillary Refill: Capillary refill takes less than 2 seconds.     Coloration: Skin is not cyanotic, jaundiced or pale.     Findings: No rash.  Neurological:     General: No focal deficit present.     Mental Status: She is alert and oriented to person, place, and time.     Sensory: Sensation is intact.     Motor: Motor function is intact.     Coordination: Coordination is intact.     Gait: Gait is intact.     Deep Tendon Reflexes: Reflexes are normal and symmetric.  Psychiatric:        Attention and Perception: Attention and perception normal.        Mood and Affect: Mood and affect normal.        Speech: Speech normal.        Behavior: Behavior normal. Behavior is cooperative.        Thought Content: Thought content normal.        Cognition and Memory: Cognition and memory normal.        Judgment: Judgment normal.     Results for orders placed or performed during the hospital encounter of 11/29/21  Resp Panel by RT-PCR (Flu A&B, Covid) Nasopharyngeal Swab   Specimen: Nasopharyngeal Swab; Nasopharyngeal(NP) swabs in vial transport medium  Result Value Ref Range   SARS Coronavirus 2 by RT PCR NEGATIVE NEGATIVE   Influenza A by PCR NEGATIVE NEGATIVE   Influenza B by PCR NEGATIVE NEGATIVE  CBC with Differential  Result Value Ref Range   WBC 11.7 (H) 4.0 - 10.5 K/uL   RBC 4.45 3.87 - 5.11 MIL/uL   Hemoglobin 10.8 (L) 12.0 - 15.0 g/dL   HCT 38.1 36.0 - 46.0 %   MCV 85.6 80.0 - 100.0 fL   MCH 24.3 (L) 26.0 - 34.0 pg   MCHC 28.3 (L) 30.0  - 36.0 g/dL   RDW 20.5 (H) 11.5 - 15.5 %   Platelets 288 150 - 400 K/uL   nRBC 0.0 0.0 - 0.2 %   Neutrophils Relative % 81 %   Neutro Abs 9.5 (H) 1.7 - 7.7 K/uL   Lymphocytes Relative 12 %   Lymphs Abs 1.4 0.7 - 4.0 K/uL   Monocytes Relative 6 %   Monocytes Absolute 0.7 0.1 - 1.0 K/uL   Eosinophils Relative 0 %   Eosinophils Absolute 0.0 0.0 - 0.5 K/uL   Basophils Relative 0 %   Basophils Absolute 0.1 0.0 - 0.1 K/uL   Immature Granulocytes 1 %   Abs Immature Granulocytes 0.06 0.00 - 0.07 K/uL  Brain natriuretic peptide  Result Value Ref Range   B Natriuretic Peptide 528.3 (H) 0.0 - 100.0  pg/mL  Comprehensive metabolic panel  Result Value Ref Range   Sodium 140 135 - 145 mmol/L   Potassium 3.9 3.5 - 5.1 mmol/L   Chloride 103 98 - 111 mmol/L   CO2 26 22 - 32 mmol/L   Glucose, Bld 164 (H) 70 - 99 mg/dL   BUN 13 8 - 23 mg/dL   Creatinine, Ser 1.14 (H) 0.44 - 1.00 mg/dL   Calcium 8.9 8.9 - 10.3 mg/dL   Total Protein 5.9 (L) 6.5 - 8.1 g/dL   Albumin 2.7 (L) 3.5 - 5.0 g/dL   AST 24 15 - 41 U/L   ALT 41 0 - 44 U/L   Alkaline Phosphatase 58 38 - 126 U/L   Total Bilirubin 0.8 0.3 - 1.2 mg/dL   GFR, Estimated 53 (L) >60 mL/min   Anion gap 11 5 - 15  CBC  Result Value Ref Range   WBC 12.3 (H) 4.0 - 10.5 K/uL   RBC 4.40 3.87 - 5.11 MIL/uL   Hemoglobin 10.6 (L) 12.0 - 15.0 g/dL   HCT 36.9 36.0 - 46.0 %   MCV 83.9 80.0 - 100.0 fL   MCH 24.1 (L) 26.0 - 34.0 pg   MCHC 28.7 (L) 30.0 - 36.0 g/dL   RDW 20.6 (H) 11.5 - 15.5 %   Platelets 292 150 - 400 K/uL   nRBC 0.0 0.0 - 0.2 %  Basic metabolic panel  Result Value Ref Range   Sodium 138 135 - 145 mmol/L   Potassium 3.4 (L) 3.5 - 5.1 mmol/L   Chloride 103 98 - 111 mmol/L   CO2 20 (L) 22 - 32 mmol/L   Glucose, Bld 332 (H) 70 - 99 mg/dL   BUN 15 8 - 23 mg/dL   Creatinine, Ser 1.29 (H) 0.44 - 1.00 mg/dL   Calcium 8.9 8.9 - 10.3 mg/dL   GFR, Estimated 46 (L) >60 mL/min   Anion gap 15 5 - 15  Glucose, capillary  Result Value Ref  Range   Glucose-Capillary 276 (H) 70 - 99 mg/dL  Glucose, capillary  Result Value Ref Range   Glucose-Capillary 280 (H) 70 - 99 mg/dL  Glucose, capillary  Result Value Ref Range   Glucose-Capillary 234 (H) 70 - 99 mg/dL  Glucose, capillary  Result Value Ref Range   Glucose-Capillary 165 (H) 70 - 99 mg/dL  Troponin I (High Sensitivity)  Result Value Ref Range   Troponin I (High Sensitivity) 20 (H) <18 ng/L  Troponin I (High Sensitivity)  Result Value Ref Range   Troponin I (High Sensitivity) 27 (H) <18 ng/L       Pertinent labs & imaging results that were available during my care of the patient were reviewed by me and considered in my medical decision making.  Assessment & Plan:  [name] was seen today for cough congestion x 1 week.  Diagnoses and all orders for this visit:  COPD exacerbation ([place]) No indications for admission at present. Due to recent pneumonia and hospitalization, will treat with Levaquin. Prednisone and Mucinex as prescribed. Aware of red flags which require emergent evaluation and treatment. Follow up in 2 weeks for reevaluation or sooner if needed.  -     predniSONE (DELTASONE) 20 MG tablet; Take 2 tablets (40 mg total) by mouth daily with breakfast for 5 days. -     levofloxacin (LEVAQUIN) 500 MG tablet; Take 1 tablet (500 mg total) by mouth daily for 7 days. -     guaiFENesin (MUCINEX) 600 MG 12  hr tablet; Take 1 tablet (600 mg total) by mouth 2 (two) times daily.     Continue all other maintenance medications.  Follow up plan: Return in 2 weeks (on 01/22/2022), or if symptoms worsen or fail to improve.   Continue healthy lifestyle choices, including diet (rich in fruits, vegetables, and lean proteins, and low in salt and simple carbohydrates) and exercise (at least 30 minutes of moderate physical activity daily).  Educational handout given for COPD exacerbation   The above assessment and management plan was discussed with the patient. The patient  verbalized understanding of and has agreed to the management plan. Patient is aware to call the clinic if they develop any new symptoms or if symptoms persist or worsen. Patient is aware when to return to the clinic for a follow-up visit. Patient educated on when it is appropriate to go to the emergency department.   Monia Pouch, FNP-C [facility] [phone]

## 2022-01-12 DIAGNOSIS — J432 Centrilobular emphysema: Secondary | ICD-10-CM | POA: Diagnosis not present

## 2022-01-20 ENCOUNTER — Ambulatory Visit: Payer: Medicare HMO | Admitting: Physician Assistant

## 2022-02-02 ENCOUNTER — Ambulatory Visit: Payer: Self-pay | Admitting: *Deleted

## 2022-02-02 NOTE — Chronic Care Management (AMB) (Signed)
  Chronic Care Management   Note  02/02/2022 Name: [name] MRN: [id number] DOB: 21-Mar-1956    Patient has not recently engaged with the Chronic Care Management RN Care Manager. Removing RN Care Manager from Care Team and closing [place]. If patient is currently engaged with another CCM team member I will forward this encounter to inform them of my case closure. Patient may be eligible for re-engagement with RN Care Manager in the future if necessary and can discuss this with their PCP.  [name], BSN, RN-BC Embedded Chronic Care Manager [facility] [place] Management Direct Dial: [phone]

## 2022-02-10 ENCOUNTER — Ambulatory Visit: Payer: Medicare HMO | Admitting: Licensed Clinical Social Worker

## 2022-02-10 DIAGNOSIS — I509 Heart failure, unspecified: Secondary | ICD-10-CM

## 2022-02-10 DIAGNOSIS — F5101 Primary insomnia: Secondary | ICD-10-CM

## 2022-02-10 DIAGNOSIS — F3342 Major depressive disorder, recurrent, in full remission: Secondary | ICD-10-CM

## 2022-02-10 DIAGNOSIS — J441 Chronic obstructive pulmonary disease with (acute) exacerbation: Secondary | ICD-10-CM

## 2022-02-10 DIAGNOSIS — E1159 Type 2 diabetes mellitus with other circulatory complications: Secondary | ICD-10-CM

## 2022-02-10 DIAGNOSIS — F411 Generalized anxiety disorder: Secondary | ICD-10-CM

## 2022-02-10 DIAGNOSIS — I10 Essential (primary) hypertension: Secondary | ICD-10-CM

## 2022-02-10 DIAGNOSIS — I2510 Atherosclerotic heart disease of native coronary artery without angina pectoris: Secondary | ICD-10-CM

## 2022-02-10 NOTE — Patient Instructions (Signed)
Visit Information  Thank you for taking time to visit with me today. Please don't hesitate to contact me if I can be of assistance to you before our next scheduled telephone appointment.  [name] is discharging client today from [facility] services  Please call the care guide team at [phone] if you need to cancel or reschedule your appointment.   If you are experiencing a Mental Health or [place] or need someone to talk to, please call the [facility]: [phone]   Following is a copy of your full plan of care:  Care Plan : [name] care plan  Updates made by Katha Cabal, [name] since 02/10/2022 12:00 AM     Problem: Emotional Distress      Goal: Emotional Health Supported; Manage depression and anxiety issues   Start Date: 02/11/2021  Expected End Date: 03/18/2022  This Visit's Progress: On track  Recent Progress: On track  Priority: Medium  Note:   Current Barriers:  Chronic Mental Health needs related to depression and anxiety Mobility issues Suicidal Ideation/Homicidal Ideation: No  Clinical Social Work Goal(s):  patient will work with SW monthly by telephone or in person to reduce or manage symptoms related to depression and anxiety patient will work with SW monthly to address concerns related to mobility of client and related to pain issues of client  Interventions: 1:1 collaboration with [name], [place] regarding development and update of comprehensive plan of care as evidenced by provider attestation and co-signature Collaborated with [place] regarding client status and involvement with CCM program support. [name] RN has discharged client from nursing support with CCM. [name]  to discharge client today from [place].  Client may talk with [name], [place] regarding Care Management support services for client, as needed, in the future.   Patient Self Care Activities:  Self administers medications  as prescribed Attends scheduled medical appointment  Patient Coping Strengths:  Family Friends  Patient Self Care Deficits:  Depression issues Anxiety issues  Patient Goals:  - spend time or talk with others at least 2 to 3 times per week - practice relaxation or meditation daily - keep a calendar with appointment dates  Follow Up Plan: [name]  is discharging client today from [place]. Laury was given information about Care Management services by the embedded care coordination team including:  Care Management services include personalized support from designated clinical staff supervised by her physician, including individualized plan of care and coordination with other care providers 24/7 contact phone numbers for assistance for urgent and routine care needs. The patient may stop CCM services at any time (effective at the end of the month) by phone call to the office staff.  Patient agreed to services and verbal consent obtained.   [name].Chizara Mena MSW, [place] representative [facility] Care Management [phone]

## 2022-02-10 NOTE — Chronic Care Management (AMB) (Signed)
Care Management Clinical Social Work Note  02/10/2022 Name: [name] MRN: [id number] DOB: Jul 22, 1956  [name] is a 66 y.o. year old female who is a primary care patient of [name], [place].  The Care Management team was consulted for assistance with chronic disease management and coordination needs.  [name] collaborated with [employer] regarding client status and involvement with CCM support services. RNCM [name] has discharged client from [place]. [name] is discharging client today from [place] support  Consent to Services:  [name] was given information about Care Management services today including:  Care Management services includes personalized support from designated clinical staff supervised by her physician, including individualized plan of care and coordination with other care providers 24/7 contact phone numbers for assistance for urgent and routine care needs. The patient may stop case management services at any time by phone call to the office staff.  Patient agreed to services and consent obtained.   Assessment: Review of patient past medical history, allergies, medications, and health status, including review of relevant consultants reports was performed today as part of a comprehensive evaluation and provision of chronic care management and care coordination services.  SDOH (Social Determinants of Health) assessments and interventions performed:  SDOH Interventions    Flowsheet Row Most Recent Value  SDOH Interventions   Stress Interventions Other (Comment)  [client has stress related to managing medical needs]  Depression Interventions/Treatment  Currently on Treatment        Care Plan  Allergies  Allergen Reactions   Food Other (See Comments)    Nutmeg= difficulty breathing   Nutmeg Oil (Myristica Oil) Hives   Other Other (See Comments)    Nutmeg= difficulty breathing   Amlodipine Swelling    Outpatient  Encounter Medications as of 02/10/2022  Medication Sig   albuterol (VENTOLIN HFA) 108 (90 Base) MCG/ACT inhaler Inhale 2 puffs into the lungs every 6 (six) hours as needed for wheezing or shortness of breath.   aspirin EC 81 MG tablet Take 1 tablet (81 mg total) by mouth daily. Swallow whole.   atorvastatin (LIPITOR) 80 MG tablet Take 1 tablet (80 mg total) by mouth at bedtime.   BREZTRI AEROSPHERE 160-9-4.8 MCG/ACT AERO Inhale 2 puffs into the lungs in the morning and at bedtime.   Cholecalciferol (VITAMIN D3) 5000 units TABS Take 10,000 Units by mouth every evening.   escitalopram (LEXAPRO) 20 MG tablet Take 1 tablet (20 mg total) by mouth daily.   fish oil-omega-3 fatty acids 1000 MG capsule Take 1,000 mg by mouth at bedtime.   furosemide (LASIX) 40 MG tablet Take 1 tablet (40 mg total) by mouth daily.   hydrOXYzine (VISTARIL) 25 MG capsule Take 1 capsule (25 mg total) by mouth every 8 (eight) hours as needed.   metFORMIN (GLUCOPHAGE) 500 MG tablet Take 1 tablet (500 mg total) by mouth 2 (two) times daily with a meal.   metoprolol tartrate (LOPRESSOR) 25 MG tablet Take 1 tablet (25 mg total) by mouth 2 (two) times daily.   potassium chloride SA (KLOR-CON M) 20 MEQ tablet Take 1 tablet (20 mEq total) by mouth daily.   pramipexole (MIRAPEX) 1 MG tablet Take 1 tablet (1 mg total) by mouth in the morning and at bedtime.   vitamin B-12 (CYANOCOBALAMIN) 1000 MCG tablet Take 1 tablet (1,000 mcg total) by mouth daily.   No facility-administered encounter medications on file as of 02/10/2022.    Patient Active Problem List   Diagnosis  Date Noted   Thyroid nodule incidentally noted on imaging study 12/01/2021   COPD exacerbation ([place]) 11/29/2021   Acute on chronic respiratory failure with hypoxia ([place]) 11/29/2021   Acute renal failure superimposed on stage 3a chronic kidney disease ([place]) 11/29/2021   Septic shock ([place]) 11/16/2021   Pressure injury of skin 11/16/2021   Depression 04/08/2016    Morbid obesity ([place]) 12/20/2015   DM (diabetes mellitus), type 2 with renal complications ([place]) 57/32/2025   Restless leg syndrome 03/12/2015   Vitamin D deficiency 03/12/2015   Generalized anxiety disorder 02/14/2014   Malignant neoplasm of upper-inner quadrant of female breast ([place]) 07/30/2011   Myocardial infarction ([place])    Hypertension    Hyperlipidemia    CAD S/P percutaneous coronary angioplasty    COPD (chronic obstructive pulmonary disease) ([place])    Carotid stenosis     Conditions to be addressed/monitored: client to manage depression issues and anxiety issues  Care Plan : [name] care plan  Updates made by Tracy Cabal, [name] since 02/10/2022 12:00 AM     Problem: Emotional Distress      Goal: Emotional Health Supported; Manage depression and anxiety issues   Start Date: 02/11/2021  Expected End Date: 03/18/2022  This Visit's Progress: On track  Recent Progress: On track  Priority: Medium  Note:   Current Barriers:  Chronic Mental Health needs related to depression and anxiety Mobility issues Suicidal Ideation/Homicidal Ideation: No  Clinical Social Work Goal(s):  patient will work with SW monthly by telephone or in person to reduce or manage symptoms related to depression and anxiety patient will work with SW monthly to address concerns related to mobility of client and related to pain issues of client  Interventions: 1:1 collaboration with [name], [place] regarding development and update of comprehensive plan of care as evidenced by provider attestation and co-signature Collaborated with [name] regarding client status and involvement with CCM program support. Tracy Sicilian RN has discharged client from nursing support with CCM. [name]  to discharge client today from [place].  Client may talk with [name], [place] regarding Care Management support services for client, as needed, in the future.   Patient Self Care Activities:   Self administers medications as prescribed Attends scheduled medical appointment  Patient Coping Strengths:  Family Friends  Patient Self Care Deficits:  Depression issues Anxiety issues  Patient Goals:  - spend time or talk with others at least 2 to 3 times per week - practice relaxation or meditation daily - keep a calendar with appointment dates  Follow Up Plan: [name]  is discharging client today from [place].Maziah Keeling MSW, [place] [facility] representative [facility] Care Management [phone]

## 2022-02-19 ENCOUNTER — Ambulatory Visit: Payer: Medicare HMO | Admitting: Cardiology

## 2022-02-19 DIAGNOSIS — H18413 Arcus senilis, bilateral: Secondary | ICD-10-CM | POA: Diagnosis not present

## 2022-02-19 DIAGNOSIS — H2512 Age-related nuclear cataract, left eye: Secondary | ICD-10-CM | POA: Diagnosis not present

## 2022-02-19 DIAGNOSIS — H25013 Cortical age-related cataract, bilateral: Secondary | ICD-10-CM | POA: Diagnosis not present

## 2022-02-19 DIAGNOSIS — H25043 Posterior subcapsular polar age-related cataract, bilateral: Secondary | ICD-10-CM | POA: Diagnosis not present

## 2022-02-19 DIAGNOSIS — H2513 Age-related nuclear cataract, bilateral: Secondary | ICD-10-CM | POA: Diagnosis not present

## 2022-02-23 ENCOUNTER — Encounter: Payer: Self-pay | Admitting: Nurse Practitioner

## 2022-02-23 ENCOUNTER — Ambulatory Visit (INDEPENDENT_AMBULATORY_CARE_PROVIDER_SITE_OTHER): Payer: Medicare HMO | Admitting: Nurse Practitioner

## 2022-02-23 ENCOUNTER — Telehealth: Payer: Self-pay | Admitting: *Deleted

## 2022-02-23 ENCOUNTER — Ambulatory Visit (INDEPENDENT_AMBULATORY_CARE_PROVIDER_SITE_OTHER): Payer: Medicare HMO

## 2022-02-23 VITALS — BP 110/69 | HR 72 | Temp 98.0°F | Resp 20 | Ht 65.0 in | Wt 207.0 lb

## 2022-02-23 DIAGNOSIS — S8991XA Unspecified injury of right lower leg, initial encounter: Secondary | ICD-10-CM | POA: Diagnosis not present

## 2022-02-23 DIAGNOSIS — S01119A Laceration without foreign body of unspecified eyelid and periocular area, initial encounter: Secondary | ICD-10-CM

## 2022-02-23 NOTE — Progress Notes (Signed)
   Subjective:    Patient ID: [name], female    DOB: Apr 26, 1956, 66 y.o.   MRN: [id number]   Chief Complaint: fall injury  HPI  Patient got out of bed to get something to drink and she got light headed and fail. She landed on her right knee. And her face. It did not knock her out.    Review of Systems  Constitutional:  Negative for diaphoresis.  Eyes:  Negative for pain and visual disturbance.  Respiratory:  Negative for shortness of breath.   Cardiovascular:  Negative for chest pain, palpitations and leg swelling.  Gastrointestinal:  Negative for abdominal pain.  Endocrine: Negative for polydipsia.  Skin:  Negative for rash.  Neurological:  Negative for dizziness and weakness.  Hematological:  Does not bruise/bleed easily.  Psychiatric/Behavioral:  The patient is not nervous/anxious.   All other systems reviewed and are negative.      Objective:   Physical Exam Constitutional:      Appearance: Normal appearance.  Cardiovascular:     Rate and Rhythm: Normal rate and regular rhythm.     Heart sounds: Normal heart sounds.  Pulmonary:     Effort: Pulmonary effort is normal.     Breath sounds: Normal breath sounds.  Musculoskeletal:     Comments: FROM  of right knee with pain on flexion and extension No effusion All ligaments intact  Skin:    General: Skin is warm.     Comments: 2 small superficial lacerations to left eye lid  Neurological:     General: No focal deficit present.     Mental Status: She is alert and oriented to person, place, and time.  Psychiatric:        Mood and Affect: Mood normal.        Behavior: Behavior normal.   BP 110/69   Pulse 72   Temp 98 F (36.7 C) (Temporal)   Resp 20   Ht '5\' 5"'$  (1.651 m)   Wt 207 lb (93.9 kg)   LMP 10/17/2005   SpO2 91%   BMI 34.45 kg/m          Assessment & Plan:   [name] in today with chief complaint of Knee Pain (Right knee/Fell this AM/)   1. Injury of right knee, initial  encounter Ice bid Elevate Compression wrap when walking rest - DG Knee 1-2 Views Right  2. Laceration of eyelid without involvement of lid margin Dab clean BID Do not rub' should heal well with no stitches Antibiotic ointment BID    The above assessment and management plan was discussed with the patient. The patient verbalized understanding of and has agreed to the management plan. Patient is aware to call the clinic if symptoms persist or worsen. Patient is aware when to return to the clinic for a follow-up visit. Patient educated on when it is appropriate to go to the emergency department.   [name], FNP

## 2022-02-23 NOTE — Telephone Encounter (Signed)
   Patient Name: [name]  DOB: 01/05/1956 MRN: [id number]  Primary Cardiologist: [name], MD  Chart reviewed as part of pre-operative protocol coverage. Cataract extractions are recognized in guidelines as low risk surgeries that do not typically require specific preoperative testing or holding of blood thinner therapy. Therefore, given past medical history and time since last visit, based on ACC/AHA guidelines, [name] would be at acceptable risk for the planned procedure without further cardiovascular testing.   I will route this recommendation to the requesting party via Epic fax function and remove from pre-op pool.  Please call with questions.  Deberah Pelton, NP 02/23/2022, 2:31 PM

## 2022-02-23 NOTE — Telephone Encounter (Signed)
   Pre-operative Risk Assessment    Patient Name: [name]  DOB: 02/07/1956 MRN: [id number]      Request for Surgical Clearance    Procedure:   CATARACT EXTRACTION WITH INTRAOCULAR LENS OF THE LEFT EYE THEN FOLLOW BY THE RIGHT EYE 2 WEEKS LATER  Date of Surgery:  05/06/2022 & 05/20/2022                             Surgeon:  DR. LISA SUN Surgeon's Group or Practice Name:  [facility] Phone number:  [phone] Fax number:  [phone]   Type of Clearance Requested:   - Medical  - Pharmacy:  Hold Aspirin     Type of Anesthesia:   TOPICAL ANESTHESIA WITH IV MEDICATION    Additional requests/questions:    [name]   02/23/2022, 12:00 PM

## 2022-02-23 NOTE — Patient Instructions (Signed)
RICE Therapy for Routine Care of Injuries Many injuries can be cared for with rest, ice, compression, and elevation (RICE therapy). This includes: Resting the injured body part. Putting ice on the injury. Putting pressure (compression) on the injury. Raising the injured part (elevation). Using RICE therapy can help to lessen pain and swelling. Supplies needed: Ice. Plastic bag. Towel. Elastic bandage. Pillow or pillows to raise your injured body part. How to care for your injury with RICE therapy Rest Try to rest the injured part of your body. You can go back to your normal activities when your doctor says it is okay to do them and when you can do them without pain. If you rest the injury too much, it may not heal as well. Some injuries heal better with early movement instead of resting for too long. Ask your doctor if you should do exercises to help your injury get better. Ice  If told, put ice on the injured area. To do this: Put ice in a plastic bag. Place a towel between your skin and the bag. Leave the ice on for 20 minutes, 2-3 times a day. Take off the ice if your skin turns bright red. This is very important. If you cannot feel pain, heat, or cold, you have a greater risk of damage to the area. Do not put ice on your bare skin. Use ice for as many days as your doctor tells you to use it. Compression Put pressure on the injured area. This can be done with an elastic bandage. If this type of bandage has been put on your injury: Follow instructions on the package the bandage came in about how to use it. Do not wrap the bandage too tightly. Wrap the bandage more loosely if part of your body beyond the bandage is blue, swollen, cold, painful, or loses feeling. Take off the bandage and put it on again every 3-4 hours or as told by your doctor. See your doctor if the bandage seems to make your problems worse.  Elevation Raise the injured area above the level of your heart while you  are sitting or lying down. Follow these instructions at home: If your symptoms get worse or last a long time, make a follow-up appointment with your doctor. You may need to have imaging tests, such as X-rays or an MRI. If you have imaging tests, ask how to get your results when they are ready. Return to your normal activities when your doctor says that it is safe. Keep all follow-up visits. Contact a doctor if: You keep having pain and swelling. Your symptoms get worse. Get help right away if: You have sudden, very bad pain at your injury or lower than your injury. You have redness or more swelling around your injury. You have tingling or numbness at your injury or lower than your injury, and it does not go away when you take off the bandage. Summary Many injuries can be cared for using rest, ice, compression, and elevation (RICE therapy). You can go back to your normal activities when your doctor says it is okay and when you can do them without pain. Put ice on the injured area as told by your doctor. Get help if your symptoms get worse or if you keep having pain and swelling. This information is not intended to replace advice given to you by your health care provider. Make sure you discuss any questions you have with your health care provider. Document Revised: 05/02/2020 Document Reviewed: 05/02/2020   Elsevier Patient Education  2023 [employer]  

## 2022-03-02 ENCOUNTER — Other Ambulatory Visit: Payer: Self-pay

## 2022-03-02 DIAGNOSIS — Z87891 Personal history of nicotine dependence: Secondary | ICD-10-CM

## 2022-03-02 DIAGNOSIS — Z122 Encounter for screening for malignant neoplasm of respiratory organs: Secondary | ICD-10-CM

## 2022-03-07 ENCOUNTER — Other Ambulatory Visit: Payer: Self-pay | Admitting: Nurse Practitioner

## 2022-03-09 NOTE — Telephone Encounter (Signed)
On med list but not prescribed here, per protocol you must approve Last office visit 02/23/22

## 2022-03-18 ENCOUNTER — Other Ambulatory Visit: Payer: Self-pay | Admitting: Nurse Practitioner

## 2022-03-18 DIAGNOSIS — Z1231 Encounter for screening mammogram for malignant neoplasm of breast: Secondary | ICD-10-CM

## 2022-03-31 ENCOUNTER — Encounter: Payer: Medicare HMO | Admitting: Acute Care

## 2022-04-01 ENCOUNTER — Ambulatory Visit (HOSPITAL_BASED_OUTPATIENT_CLINIC_OR_DEPARTMENT_OTHER): Payer: Medicare HMO

## 2022-04-02 ENCOUNTER — Ambulatory Visit: Payer: Medicare HMO | Admitting: Pulmonary Disease

## 2022-04-13 ENCOUNTER — Telehealth: Payer: Self-pay | Admitting: Nurse Practitioner

## 2022-04-13 ENCOUNTER — Ambulatory Visit (INDEPENDENT_AMBULATORY_CARE_PROVIDER_SITE_OTHER): Payer: Medicare HMO | Admitting: Nurse Practitioner

## 2022-04-13 ENCOUNTER — Encounter: Payer: Self-pay | Admitting: Nurse Practitioner

## 2022-04-13 VITALS — BP 129/65 | HR 71 | Temp 98.2°F | Resp 20 | Ht 65.0 in | Wt 213.0 lb

## 2022-04-13 DIAGNOSIS — I2510 Atherosclerotic heart disease of native coronary artery without angina pectoris: Secondary | ICD-10-CM

## 2022-04-13 DIAGNOSIS — J449 Chronic obstructive pulmonary disease, unspecified: Secondary | ICD-10-CM

## 2022-04-13 DIAGNOSIS — Z9861 Coronary angioplasty status: Secondary | ICD-10-CM

## 2022-04-13 DIAGNOSIS — N1831 Chronic kidney disease, stage 3a: Secondary | ICD-10-CM

## 2022-04-13 DIAGNOSIS — N179 Acute kidney failure, unspecified: Secondary | ICD-10-CM

## 2022-04-13 DIAGNOSIS — F5101 Primary insomnia: Secondary | ICD-10-CM

## 2022-04-13 DIAGNOSIS — I10 Essential (primary) hypertension: Secondary | ICD-10-CM

## 2022-04-13 DIAGNOSIS — I6523 Occlusion and stenosis of bilateral carotid arteries: Secondary | ICD-10-CM

## 2022-04-13 DIAGNOSIS — I2583 Coronary atherosclerosis due to lipid rich plaque: Secondary | ICD-10-CM

## 2022-04-13 DIAGNOSIS — E1122 Type 2 diabetes mellitus with diabetic chronic kidney disease: Secondary | ICD-10-CM | POA: Diagnosis not present

## 2022-04-13 DIAGNOSIS — I252 Old myocardial infarction: Secondary | ICD-10-CM | POA: Diagnosis not present

## 2022-04-13 DIAGNOSIS — E559 Vitamin D deficiency, unspecified: Secondary | ICD-10-CM

## 2022-04-13 DIAGNOSIS — N182 Chronic kidney disease, stage 2 (mild): Secondary | ICD-10-CM

## 2022-04-13 DIAGNOSIS — F3342 Major depressive disorder, recurrent, in full remission: Secondary | ICD-10-CM

## 2022-04-13 DIAGNOSIS — E7800 Pure hypercholesterolemia, unspecified: Secondary | ICD-10-CM | POA: Diagnosis not present

## 2022-04-13 DIAGNOSIS — F411 Generalized anxiety disorder: Secondary | ICD-10-CM

## 2022-04-13 DIAGNOSIS — I509 Heart failure, unspecified: Secondary | ICD-10-CM

## 2022-04-13 DIAGNOSIS — G2581 Restless legs syndrome: Secondary | ICD-10-CM

## 2022-04-13 DIAGNOSIS — Z1211 Encounter for screening for malignant neoplasm of colon: Secondary | ICD-10-CM

## 2022-04-13 LAB — BAYER DCA HB A1C WAIVED: HB A1C (BAYER DCA - WAIVED): 6.5 % — ABNORMAL HIGH (ref 4.8–5.6)

## 2022-04-13 MED ORDER — PRAMIPEXOLE DIHYDROCHLORIDE 1 MG PO TABS: 1.0000 mg | ORAL_TABLET | Freq: Two times a day (BID) | ORAL | 1 refills | Status: DC

## 2022-04-13 MED ORDER — METOPROLOL TARTRATE 25 MG PO TABS: 25.0000 mg | ORAL_TABLET | Freq: Two times a day (BID) | ORAL | 1 refills | Status: DC

## 2022-04-13 MED ORDER — ESCITALOPRAM OXALATE 20 MG PO TABS: 20.0000 mg | ORAL_TABLET | Freq: Every day | ORAL | 1 refills | Status: DC

## 2022-04-13 MED ORDER — POTASSIUM CHLORIDE CRYS ER 20 MEQ PO TBCR: 20.0000 meq | EXTENDED_RELEASE_TABLET | Freq: Every day | ORAL | 1 refills | Status: DC

## 2022-04-13 MED ORDER — FUROSEMIDE 40 MG PO TABS: 40.0000 mg | ORAL_TABLET | Freq: Every day | ORAL | 1 refills | Status: DC

## 2022-04-13 MED ORDER — ATORVASTATIN CALCIUM 80 MG PO TABS: 80.0000 mg | ORAL_TABLET | Freq: Every day | ORAL | 1 refills | Status: DC

## 2022-04-13 MED ORDER — METFORMIN HCL 500 MG PO TABS: 500.0000 mg | ORAL_TABLET | Freq: Two times a day (BID) | ORAL | 1 refills | Status: DC

## 2022-04-13 MED ORDER — MIRTAZAPINE 15 MG PO TBDP: 15.0000 mg | ORAL_TABLET | Freq: Every day | ORAL | 2 refills | Status: DC

## 2022-04-13 NOTE — Progress Notes (Signed)
Subjective:    Patient ID: [name], female    DOB: [birth year], 66 y.o.   MRN: [id number]   Chief Complaint:medical management of chronic issues     HPI:  [name] is a 66 y.o. who identifies as a female who was assigned female at birth.   Social history: Lives with: by herself Work history: disability   Comes in today for follow up of the following chronic medical issues:  1. Primary hypertension No c/o chest pain, sob or headache. Does not check blood pressure at home. BP Readings from Last 3 Encounters:  02/23/22 110/69  01/08/22 118/74  12/31/21 124/86     2. CAD S/P percutaneous coronary angioplasty 3. History of myocardial infarction Last saw cardiology on last November 2022. No changes were made to plan of care at that time..   4. Bilateral carotid artery stenosis Last carotid doppler study was done on 08/25/18. Showed degree of narrowing was less than 50%.  5. Chronic obstructive pulmonary disease, unspecified COPD type [facility]) She saw pulmonology on 12/31/21. No change to plan of care- she is to continue breztri as ordered and follow up in 3 months. She has not yet followed up.  6. Type 2 diabetes mellitus with stage 2 chronic kidney disease, without long-term current use of insulin (HCC) SHE HAS NOT BEEN CHECKING HER BLOOD SUGARS INBTHE LAST SEVERAL MONTHS. Lab Results  Component Value Date   HGBA1C 7.3 (H) 11/16/2021     7. Pure hypercholesterolemia Does not watch diet and does no dedicated exercise. Lab Results  Component Value Date   CHOL 90 (L) 10/09/2021   HDL 28 (L) 10/09/2021   LDLCALC 43 10/09/2021   LDLDIRECT 100.0 05/16/2007   TRIG 99 10/09/2021   CHOLHDL 3.2 10/09/2021     8. Acute renal failure superimposed on stage 3a chronic kidney disease, unspecified acute renal failure type (HCC) No voiding issues Lab Results  Component Value Date   CREATININE 1.29 (H) 11/30/2021     9. Generalized anxiety disorder Patient use  to be on klonopin, but she failed drug screen, so she is no longer prescribed this from our office. She does say that she is very anxious.    04/13/2022    2:17 PM 01/08/2022    2:45 PM 12/19/2021   10:55 AM 10/09/2021    1:10 PM  GAD 7 : Generalized Anxiety Score  Nervous, Anxious, on Edge _0 Control/stop worrying _1 Worry too much - different things _2 Trouble relaxing _3 Restless _4 Easily annoyed or irritable _5 Afraid - awful might happen _6 Total GAD 7 Score _7 Anxiety Difficulty Very difficult Very difficult Somewhat difficult Very difficult      10. Recurrent major depressive disorder, in full remission ([place]) Is on lexapro and is doing well.    04/13/2022    2:15 PM 02/10/2022    3:50 PM 01/08/2022    2:45 PM  Depression screen PHQ 2/9  Decreased Interest _8 Down, Depressed, Hopeless _9 PHQ - 2 Score _10 Altered sleeping _11 Tired, decreased energy _12 Change in appetite _13 Feeling bad or failure about yourself  2  2  Trouble concentrating _14 Moving slowly or  fidgety/restless 0 2 2  Suicidal thoughts 0 0 1  PHQ-9 Score _0 Difficult doing work/chores Not difficult at all Very difficult Very difficult     11. Restless leg syndrome Still moves herlegs a lot at night. Not sleeping well.  12. Vitamin D deficiency Does not take vitamin d daily.  13. Insomnia Is on vistaril to sleep at night. Sleeps about 3-4 hours but wake sup frequently. OTC meds never seem to help  14. Hypokalemia Denies any muscle cramping. Is on a daily potassium supplement Lab Results  Component Value Date   K 3.4 (L) 11/30/2021     15. Morbid obesity ([place]) Weight is up 6lbs  Wt Readings from Last 3 Encounters:  04/13/22 213 lb (96.6 kg)  02/23/22 207 lb (93.9 kg)  01/08/22 212 lb (96.2 kg)   BMI Readings from Last 3 Encounters:  04/13/22 35.45 kg/m  02/23/22 34.45 kg/m  01/08/22 35.28 kg/m      New complaints: None today  Allergies  Allergen Reactions   Food Other (See Comments)    Nutmeg= difficulty breathing   Nutmeg Oil (Myristica Oil) Hives   Other Other (See Comments)    Nutmeg= difficulty breathing   Amlodipine Swelling   Outpatient Encounter Medications as of 04/13/2022  Medication Sig   albuterol (VENTOLIN HFA) 108 (90 Base) MCG/ACT inhaler Inhale 2 puffs into the lungs every 6 (six) hours as needed for wheezing or shortness of breath.   aspirin EC 81 MG tablet Take 1 tablet (81 mg total) by mouth daily. Swallow whole.   atorvastatin (LIPITOR) 80 MG tablet Take 1 tablet (80 mg total) by mouth at bedtime.   BREZTRI AEROSPHERE 160-9-4.8 MCG/ACT AERO INHALE 2 PUFFS INTO THE LUNGS IN THE MORNING AND AT BEDTIME.   Cholecalciferol (VITAMIN D3) 5000 units TABS Take 10,000 Units by mouth every evening.   escitalopram (LEXAPRO) 20 MG tablet Take 1 tablet (20 mg total) by mouth daily.   fish oil-omega-3 fatty acids 1000 MG capsule Take 1,000 mg by mouth at bedtime.   furosemide (LASIX) 40 MG tablet Take 1 tablet (40 mg total) by mouth daily.   hydrOXYzine (VISTARIL) 25 MG capsule Take 1 capsule (25 mg total) by mouth every 8 (eight) hours as needed.   metFORMIN (GLUCOPHAGE) 500 MG tablet Take 1 tablet (500 mg total) by mouth 2 (two) times daily with a meal.   metoprolol tartrate (LOPRESSOR) 25 MG tablet Take 1 tablet (25 mg total) by mouth 2 (two) times daily.   potassium chloride SA (KLOR-CON M) 20 MEQ tablet Take 1 tablet (20 mEq total) by mouth daily.   pramipexole (MIRAPEX) 1 MG tablet Take 1 tablet (1 mg total) by mouth in the morning and at bedtime.   vitamin B-12 (CYANOCOBALAMIN) 1000 MCG tablet Take 1 tablet (1,000 mcg total) by mouth daily.   No facility-administered encounter medications on file as of 04/13/2022.    Past Surgical History:  Procedure Laterality Date   AXILLARY LYMPH NODE DISSECTION  07/31/2011   Procedure: AXILLARY LYMPH NODE DISSECTION;   Surgeon: [name], MD;  Location: [place];  Service: General;  Laterality: Left;  left axillary sentinal node biopsy   BREAST LUMPECTOMY Left 06/30/2011   BREAST SURGERY     CARDIAC CATHETERIZATION  11/02/2006; 06/03/2011   CESAREAN SECTION  1987   CORONARY STENT PLACEMENT  11/02/2006   ELBOW SURGERY     right   LEFT HEART CATH AND CORONARY ANGIOGRAPHY N/A 10/07/2020  Procedure: LEFT HEART CATH AND CORONARY ANGIOGRAPHY;  Surgeon: [name], MD;  Location: [place] [facility];  Service: Cardiovascular;  Laterality: N/A;    Family History  Problem Relation Age of Onset   Heart disease Mother    Heart disease Father    Cancer Maternal Aunt        pt unaware of what kind   Cancer Maternal Grandmother        ovarian   Heart attack Sister    Thyroid disease Daughter    Anxiety disorder Daughter    Depression Daughter    Bipolar disorder Sister       Controlled substance contract: n/a     Review of Systems  Constitutional:  Negative for diaphoresis.  Eyes:  Negative for pain.  Respiratory:  Negative for shortness of breath.   Cardiovascular:  Negative for chest pain, palpitations and leg swelling.  Gastrointestinal:  Negative for abdominal pain.  Endocrine: Negative for polydipsia.  Skin:  Negative for rash.  Neurological:  Negative for dizziness, weakness and headaches.  Hematological:  Does not bruise/bleed easily.  All other systems reviewed and are negative.      Objective:   Physical Exam Vitals and nursing note reviewed.  Constitutional:      General: She is not in acute distress.    Appearance: Normal appearance. She is well-developed.  HENT:     Head: Normocephalic.     Right Ear: Tympanic membrane normal.     Left Ear: Tympanic membrane normal.     Nose: Nose normal.     Mouth/Throat:     Mouth: Mucous membranes are moist.  Eyes:     Pupils: Pupils are equal, round, and reactive to light.  Neck:     Vascular: No carotid bruit  or JVD.  Cardiovascular:     Rate and Rhythm: Normal rate and regular rhythm.     Heart sounds: Normal heart sounds.  Pulmonary:     Effort: Pulmonary effort is normal. No respiratory distress.     Breath sounds: Normal breath sounds. No wheezing or rales.  Chest:     Chest wall: No tenderness.  Abdominal:     General: Bowel sounds are normal. There is no distension or abdominal bruit.     Palpations: Abdomen is soft. There is no hepatomegaly, splenomegaly, mass or pulsatile mass.     Tenderness: There is no abdominal tenderness.  Musculoskeletal:        General: Normal range of motion.     Cervical back: Normal range of motion and neck supple.  Lymphadenopathy:     Cervical: No cervical adenopathy.  Skin:    General: Skin is warm and dry.  Neurological:     Mental Status: She is alert and oriented to person, place, and time.     Deep Tendon Reflexes: Reflexes are normal and symmetric.  Psychiatric:        Behavior: Behavior normal.        Thought Content: Thought content normal.        Judgment: Judgment normal.     BP 129/65   Pulse 71   Temp 98.2 F (36.8 C) (Temporal)   Resp 20   Ht _0  (1.651 m)   Wt 213 lb (96.6 kg)   LMP 10/17/2005   SpO2 94%   BMI 35.45 kg/m   HGBA1c 6.5%     Assessment & Plan:   [name] comes in today with chief complaint of Medical Management  of Chronic Issues   Diagnosis and orders addressed:  1. Primary hypertension Low sodium diet - CBC with Differential/Platelet - CMP14+EGFR - furosemide (LASIX) 40 MG tablet; Take 1 tablet (40 mg total) by mouth daily.  Dispense: 90 tablet; Refill: 1 - metoprolol tartrate (LOPRESSOR) 25 MG tablet; Take 1 tablet (25 mg total) by mouth 2 (two) times daily.  Dispense: 180 tablet; Refill: 1  2. CAD S/P percutaneous coronary angioplasty 3. History of myocardial infarction Keep follow up with cardiology  4. Bilateral carotid artery stenosis Cardiology will do follow up doppler  5.  Chronic obstructive pulmonary disease, unspecified COPD type ([place]) Keep follow up appointment with pulmonology  6. Type 2 diabetes mellitus with stage 2 chronic kidney disease, without long-term current use of insulin (HCC) Continue to watch carbs in deit - Bayer DCA Hb A1c Waived - metFORMIN (GLUCOPHAGE) 500 MG tablet; Take 1 tablet (500 mg total) by mouth 2 (two) times daily with a meal.  Dispense: 180 tablet; Refill: 1  7. Pure hypercholesterolemia Low fat diet - Lipid panel  8. Acute renal failure superimposed on stage 3a chronic kidney disease, unspecified acute renal failure type ([place]) Labs pending  9. Generalized anxiety disorder Stress management  10. Recurrent major depressive disorder, in full remission ([place]) Stress management - escitalopram (LEXAPRO) 20 MG tablet; Take 1 tablet (20 mg total) by mouth daily.  Dispense: 90 tablet; Refill: 1  11. Restless leg syndrome Keep legs warn at night - pramipexole (MIRAPEX) 1 MG tablet; Take 1 tablet (1 mg total) by mouth in the morning and at bedtime.  Dispense: 180 tablet; Refill: 1  12. Vitamin D deficiency Continue vitamin d supplement  13. Morbid obesity ([place]) Discussed diet and exercise for person with BMI >25 Will recheck weight in 3-6 months   14. Chronic congestive heart failure, unspecified heart failure type (HCC) - potassium chloride SA (KLOR-CON M) 20 MEQ tablet; Take 1 tablet (20 mEq total) by mouth daily.  Dispense: 90 tablet; Refill: 1 - furosemide (LASIX) 40 MG tablet; Take 1 tablet (40 mg total) by mouth daily.  Dispense: 90 tablet; Refill: 1  15. Coronary artery disease due to lipid rich plaque  atorvastatin (LIPITOR) 80 MG tablet; Take 1 tablet (80 mg total) by mouth at bedtime.  Dispense: 90 tablet; Refill: 1  16. Primary insomnia Bedtime routine  - mirtazapine (REMERON SOL-TAB) 15 MG disintegrating tablet; Take 1 tablet (15 mg total) by mouth at bedtime.  Dispense: 30 tablet; Refill: 2   Labs  pending Health Maintenance reviewed Diet and exercise encouraged  Follow up plan: 3 months   [name], FNP

## 2022-04-13 NOTE — Telephone Encounter (Signed)
Patient calling because she needs all of her medications sent to [place], [place] instead of [facility] [address], [address]. mirtazapine (REMERON SOL-TAB) 15 MG disintegrating tablet was called in but she is unable to take, needs the regular tablets sent in because these make her gag. These need to be sent to [facility] in [place]

## 2022-04-13 NOTE — Patient Instructions (Signed)
Insomnia Insomnia is a sleep disorder that makes it difficult to fall asleep or stay asleep. Insomnia can cause fatigue, low energy, difficulty concentrating, mood swings, and poor performance at work or school. There are three different ways to classify insomnia: Difficulty falling asleep. Difficulty staying asleep. Waking up too early in the morning. Any type of insomnia can be long-term (chronic) or short-term (acute). Both are common. Short-term insomnia usually lasts for 3 months or less. Chronic insomnia occurs at least three times a week for longer than 3 months. What are the causes? Insomnia may be caused by another condition, situation, or substance, such as: Having certain mental health conditions, such as anxiety and depression. Using caffeine, alcohol, tobacco, or drugs. Having gastrointestinal conditions, such as gastroesophageal reflux disease (GERD). Having certain medical conditions. These include: Asthma. Alzheimer's disease. Stroke. Chronic pain. An overactive thyroid gland (hyperthyroidism). Other sleep disorders, such as restless legs syndrome and sleep apnea. Menopause. Sometimes, the cause of insomnia may not be known. What increases the risk? Risk factors for insomnia include: Gender. Females are affected more often than males. Age. Insomnia is more common as people get older. Stress and certain medical and mental health conditions. Lack of exercise. Having an irregular work schedule. This may include working night shifts and traveling between different time zones. What are the signs or symptoms? If you have insomnia, the main symptom is having trouble falling asleep or having trouble staying asleep. This may lead to other symptoms, such as: Feeling tired or having low energy. Feeling nervous about going to sleep. Not feeling rested in the morning. Having trouble concentrating. Feeling irritable, anxious, or depressed. How is this diagnosed? This condition  may be diagnosed based on: Your symptoms and medical history. Your health care provider may ask about: Your sleep habits. Any medical conditions you have. Your mental health. A physical exam. How is this treated? Treatment for insomnia depends on the cause. Treatment may focus on treating an underlying condition that is causing the insomnia. Treatment may also include: Medicines to help you sleep. Counseling or therapy. Lifestyle adjustments to help you sleep better. Follow these instructions at home: Eating and drinking  Limit or avoid alcohol, caffeinated beverages, and products that contain nicotine and tobacco, especially close to bedtime. These can disrupt your sleep. Do not eat a large meal or eat spicy foods right before bedtime. This can lead to digestive discomfort that can make it hard for you to sleep. Sleep habits  Keep a sleep diary to help you and your health care provider figure out what could be causing your insomnia. Write down: When you sleep. When you wake up during the night. How well you sleep and how rested you feel the next day. Any side effects of medicines you are taking. What you eat and drink. Make your bedroom a dark, comfortable place where it is easy to fall asleep. Put up shades or blackout curtains to block light from outside. Use a white noise machine to block noise. Keep the temperature cool. Limit screen use before bedtime. This includes: Not watching TV. Not using your smartphone, tablet, or computer. Stick to a routine that includes going to bed and waking up at the same times every day and night. This can help you fall asleep faster. Consider making a quiet activity, such as reading, part of your nighttime routine. Try to avoid taking naps during the day so that you sleep better at night. Get out of bed if you are still awake after   15 minutes of trying to sleep. Keep the lights down, but try reading or doing a quiet activity. When you feel  sleepy, go back to bed. General instructions Take over-the-counter and prescription medicines only as told by your health care provider. Exercise regularly as told by your health care provider. However, avoid exercising in the hours right before bedtime. Use relaxation techniques to manage stress. Ask your health care provider to suggest some techniques that may work well for you. These may include: Breathing exercises. Routines to release muscle tension. Visualizing peaceful scenes. Make sure that you drive carefully. Do not drive if you feel very sleepy. Keep all follow-up visits. This is important. Contact a health care provider if: You are tired throughout the day. You have trouble in your daily routine due to sleepiness. You continue to have sleep problems, or your sleep problems get worse. Get help right away if: You have thoughts about hurting yourself or someone else. Get help right away if you feel like you may hurt yourself or others, or have thoughts about taking your own life. Go to your nearest emergency room or: Call 911. Call the National Suicide Prevention Lifeline at 1-800-273-8255 or 988. This is open 24 hours a day. Text the Crisis Text Line at 741741. Summary Insomnia is a sleep disorder that makes it difficult to fall asleep or stay asleep. Insomnia can be long-term (chronic) or short-term (acute). Treatment for insomnia depends on the cause. Treatment may focus on treating an underlying condition that is causing the insomnia. Keep a sleep diary to help you and your health care provider figure out what could be causing your insomnia. This information is not intended to replace advice given to you by your health care provider. Make sure you discuss any questions you have with your health care provider. Document Revised: 06/23/2021 Document Reviewed: 06/23/2021 Elsevier Patient Education  2023 Elsevier Inc.  

## 2022-04-14 ENCOUNTER — Other Ambulatory Visit: Payer: Self-pay | Admitting: *Deleted

## 2022-04-14 LAB — CBC WITH DIFFERENTIAL/PLATELET
Basophils Absolute: 0.1 10*3/uL (ref 0.0–0.2)
Basos: 0 %
EOS (ABSOLUTE): 0.2 10*3/uL (ref 0.0–0.4)
Eos: 2 %
Hematocrit: 42 % (ref 34.0–46.6)
Hemoglobin: 13.2 g/dL (ref 11.1–15.9)
Immature Grans (Abs): 0.1 10*3/uL (ref 0.0–0.1)
Immature Granulocytes: 1 %
Lymphocytes Absolute: 0.9 10*3/uL (ref 0.7–3.1)
Lymphs: 8 %
MCH: 26.5 pg — ABNORMAL LOW (ref 26.6–33.0)
MCHC: 31.4 g/dL — ABNORMAL LOW (ref 31.5–35.7)
MCV: 84 fL (ref 79–97)
Monocytes Absolute: 0.7 10*3/uL (ref 0.1–0.9)
Monocytes: 6 %
Neutrophils Absolute: 10 10*3/uL — ABNORMAL HIGH (ref 1.4–7.0)
Neutrophils: 83 %
Platelets: 219 10*3/uL (ref 150–450)
RBC: 4.98 x10E6/uL (ref 3.77–5.28)
RDW: 15.9 % — ABNORMAL HIGH (ref 11.7–15.4)
WBC: 11.9 10*3/uL — ABNORMAL HIGH (ref 3.4–10.8)

## 2022-04-14 LAB — CMP14+EGFR
ALT: 11 IU/L (ref 0–32)
AST: 13 IU/L (ref 0–40)
Albumin/Globulin Ratio: 1.8 (ref 1.2–2.2)
Albumin: 4.4 g/dL (ref 3.9–4.9)
Alkaline Phosphatase: 102 IU/L (ref 44–121)
BUN/Creatinine Ratio: 21 (ref 12–28)
BUN: 22 mg/dL (ref 8–27)
Bilirubin Total: 0.4 mg/dL (ref 0.0–1.2)
CO2: 23 mmol/L (ref 20–29)
Calcium: 9.9 mg/dL (ref 8.7–10.3)
Chloride: 101 mmol/L (ref 96–106)
Creatinine, Ser: 1.07 mg/dL — ABNORMAL HIGH (ref 0.57–1.00)
Globulin, Total: 2.5 g/dL (ref 1.5–4.5)
Glucose: 106 mg/dL — ABNORMAL HIGH (ref 70–99)
Potassium: 5.2 mmol/L (ref 3.5–5.2)
Sodium: 143 mmol/L (ref 134–144)
Total Protein: 6.9 g/dL (ref 6.0–8.5)
eGFR: 58 mL/min/{1.73_m2} — ABNORMAL LOW (ref >59)

## 2022-04-14 LAB — LIPID PANEL
Chol/HDL Ratio: 3.3 ratio (ref 0.0–4.4)
Cholesterol, Total: 157 mg/dL (ref 100–199)
HDL: 47 mg/dL (ref >39)
LDL Chol Calc (NIH): 75 mg/dL (ref 0–99)
Triglycerides: 213 mg/dL — ABNORMAL HIGH (ref 0–149)
VLDL Cholesterol Cal: 35 mg/dL (ref 5–40)

## 2022-04-14 MED ORDER — MIRTAZAPINE 15 MG PO TABS: 15.0000 mg | ORAL_TABLET | Freq: Every day | ORAL | 2 refills | Status: DC

## 2022-04-14 NOTE — Telephone Encounter (Signed)
DONE

## 2022-04-17 ENCOUNTER — Other Ambulatory Visit: Payer: Self-pay | Admitting: Nurse Practitioner

## 2022-04-17 DIAGNOSIS — G2581 Restless legs syndrome: Secondary | ICD-10-CM

## 2022-04-20 ENCOUNTER — Other Ambulatory Visit: Payer: Self-pay | Admitting: *Deleted

## 2022-04-20 MED ORDER — BD SWAB SINGLE USE REGULAR PADS: MEDICATED_PAD | 0 refills | Status: AC

## 2022-04-22 ENCOUNTER — Ambulatory Visit (INDEPENDENT_AMBULATORY_CARE_PROVIDER_SITE_OTHER): Payer: Medicare HMO | Admitting: Acute Care

## 2022-04-22 ENCOUNTER — Encounter: Payer: Self-pay | Admitting: Acute Care

## 2022-04-22 DIAGNOSIS — Z87891 Personal history of nicotine dependence: Secondary | ICD-10-CM

## 2022-04-22 NOTE — Progress Notes (Signed)
Virtual Visit via Telephone Note  I connected with [name] on 04/22/22 at 11:30 AM EDT by telephone and verified that I am speaking with the correct person using two identifiers.  Location: Patient:  At home Provider:  [place], [place], [place], [address]    I discussed the limitations, risks, security and privacy concerns of performing an evaluation and management service by telephone and the availability of in person appointments. I also discussed with the patient that there may be a patient responsible charge related to this service. The patient expressed understanding and agreed to proceed.    Shared Decision Making Visit Lung Cancer Screening Program [phone])   Eligibility: Age 66 y.o. Pack Years Smoking History Calculation 50 pack year smoking history (# packs/per year x # years smoked) Recent History of coughing up blood  no Unexplained weight loss? no ( >Than 15 pounds within the last 6 months ) Prior History Lung / other cancer no (Diagnosis within the last 5 years already requiring surveillance chest CT Scans). Smoking Status Former Smoker Former Smokers: Years since quit: < 1 year  Quit Date: 10/2021  Visit Components: Discussion included one or more decision making aids. yes Discussion included risk/benefits of screening. yes Discussion included potential follow up diagnostic testing for abnormal scans. yes Discussion included meaning and risk of over diagnosis. yes Discussion included meaning and risk of False Positives. yes Discussion included meaning of total radiation exposure. yes  Counseling Included: Importance of adherence to annual lung cancer LDCT screening. yes Impact of comorbidities on ability to participate in the program. yes Ability and willingness to under diagnostic treatment. yes  Smoking Cessation Counseling: Current Smokers:  Discussed importance of smoking cessation. yes Information about tobacco cessation classes and  interventions provided to patient. yes Patient provided with "ticket" for LDCT Scan. yes Symptomatic Patient. no  Counseling NA Diagnosis Code: Tobacco Use Z72.0 Asymptomatic Patient yes  Counseling (Intermediate counseling: > three minutes counseling) [id number] Former Smokers:  Discussed the importance of maintaining cigarette abstinence. yes Diagnosis Code: Personal History of Nicotine Dependence. G25.427 Information about tobacco cessation classes and interventions provided to patient. Yes Patient provided with "ticket" for LDCT Scan. yes Written Order for Lung Cancer Screening with LDCT placed in Epic. Yes (CT Chest Lung Cancer Screening Low Dose W/O CM) [id number] Z12.2-Screening of respiratory organs Z87.891-Personal history of nicotine dependence  I spent 25 minutes of face to face time/virtual visit time  with  [name] discussing the risks and benefits of lung cancer screening. We took the time to pause the power point at intervals to allow for questions to be asked and answered to ensure understanding. We discussed that she had taken the single most powerful action possible to decrease her risk of developing lung cancer when she quit smoking. I counseled her to remain smoke free, and to contact me if she ever had the desire to smoke again so that I can provide resources and tools to help support the effort to remain smoke free. We discussed the time and location of the scan, and that either  [name] RN, [name], RN or I  or I will call / send a letter with the results within  24-72 hours of receiving them. She has the office contact information in the event she needs to speak with me,  she verbalized understanding of all of the above and had no further questions upon leaving the office.     I explained to the patient that  there has been a high incidence of coronary artery disease noted on these exams. I explained that this is a non-gated exam therefore degree or severity  cannot be determined. This patient is not on statin therapy. I have asked the patient to follow-up with their PCP regarding any incidental finding of coronary artery disease and management with diet or medication as they feel is clinically indicated. The patient verbalized understanding of the above and had no further questions.     Tracy Spatz, NP 04/22/2022

## 2022-04-22 NOTE — Patient Instructions (Signed)
Thank you for participating in the [facility]. It was our pleasure to meet you today. We will call you with the results of your scan within the next few days. Your scan will be assigned a Lung RADS category score by the physicians reading the scans.  This Lung RADS score determines follow up scanning.  See below for description of categories, and follow up screening recommendations. We will be in touch to schedule your follow up screening annually or based on recommendations of our providers. We will fax a copy of your scan results to your Primary Care Physician, or the physician who referred you to the program, to ensure they have the results. Please call the office if you have any questions or concerns regarding your scanning experience or results.  Our office number is [phone]. Please speak with Denise Phelps, RN. , or  Denise Buckner RN, They are  our Lung Cancer Screening RN.'s If They are unavailable when you call, Please leave a message on the voice mail. We will return your call at our earliest convenience.This voice mail is monitored several times a day.  Remember, if your scan is normal, we will scan you annually as long as you continue to meet the criteria for the program. (Age 55-77, Current smoker or smoker who has quit within the last 15 years). If you are a smoker, remember, quitting is the single most powerful action that you can take to decrease your risk of lung cancer and other pulmonary, breathing related problems. We know quitting is hard, and we are here to help.  Please let us know if there is anything we can do to help you meet your goal of quitting. If you are a former smoker, congratulations. We are proud of you! Remain smoke free! Remember you can refer friends or family members through the number above.  We will screen them to make sure they meet criteria for the program. Thank you for helping us take better care of you by  participating in Lung Screening.  You can receive free nicotine replacement therapy ( patches, gum or mints) by calling 1-800-QUIT NOW. Please call so we can get you on the path to becoming  a non-smoker. I know it is hard, but you can do this!  Lung RADS Categories:  Lung RADS 1: no nodules or definitely non-concerning nodules.  Recommendation is for a repeat annual scan in 12 months.  Lung RADS 2:  nodules that are non-concerning in appearance and behavior with a very low likelihood of becoming an active cancer. Recommendation is for a repeat annual scan in 12 months.  Lung RADS 3: nodules that are probably non-concerning , includes nodules with a low likelihood of becoming an active cancer.  Recommendation is for a 6-month repeat screening scan. Often noted after an upper respiratory illness. We will be in touch to make sure you have no questions, and to schedule your 6-month scan.  Lung RADS 4 A: nodules with concerning findings, recommendation is most often for a follow up scan in 3 months or additional testing based on our provider's assessment of the scan. We will be in touch to make sure you have no questions and to schedule the recommended 3 month follow up scan.  Lung RADS 4 B:  indicates findings that are concerning. We will be in touch with you to schedule additional diagnostic testing based on our provider's  assessment of the scan.  Other options for assistance in smoking cessation (   As covered by your insurance benefits)  Hypnosis for smoking cessation  [employer] [phone]  Acupuncture for smoking cessation  [facility] [phone]   

## 2022-04-27 DIAGNOSIS — B301 Conjunctivitis due to adenovirus: Secondary | ICD-10-CM | POA: Diagnosis not present

## 2022-04-28 DIAGNOSIS — Z1211 Encounter for screening for malignant neoplasm of colon: Secondary | ICD-10-CM | POA: Diagnosis not present

## 2022-05-01 ENCOUNTER — Ambulatory Visit (HOSPITAL_BASED_OUTPATIENT_CLINIC_OR_DEPARTMENT_OTHER)
Admission: RE | Admit: 2022-05-01 | Discharge: 2022-05-01 | Disposition: A | Discharge status: Home / Self Care | Payer: Medicare HMO | Source: Ambulatory Visit | Attending: Acute Care | Admitting: Acute Care

## 2022-05-01 DIAGNOSIS — Z87891 Personal history of nicotine dependence: Secondary | ICD-10-CM | POA: Insufficient documentation

## 2022-05-01 DIAGNOSIS — Z122 Encounter for screening for malignant neoplasm of respiratory organs: Secondary | ICD-10-CM | POA: Diagnosis not present

## 2022-05-04 ENCOUNTER — Ambulatory Visit
Admission: RE | Admit: 2022-05-04 | Discharge: 2022-05-04 | Disposition: A | Discharge status: Home / Self Care | Payer: Medicare HMO | Source: Ambulatory Visit | Attending: Nurse Practitioner | Admitting: Nurse Practitioner

## 2022-05-04 DIAGNOSIS — Z1231 Encounter for screening mammogram for malignant neoplasm of breast: Secondary | ICD-10-CM | POA: Diagnosis not present

## 2022-05-04 LAB — COLOGUARD: COLOGUARD: POSITIVE — AB

## 2022-05-05 NOTE — Addendum Note (Signed)
Addended by: Brynda Peon F on: 05/05/2022 09:00 AM   Modules accepted: Orders

## 2022-05-07 ENCOUNTER — Other Ambulatory Visit: Payer: Self-pay | Admitting: Acute Care

## 2022-05-07 ENCOUNTER — Telehealth: Payer: Self-pay | Admitting: *Deleted

## 2022-05-07 DIAGNOSIS — Z87891 Personal history of nicotine dependence: Secondary | ICD-10-CM

## 2022-05-07 DIAGNOSIS — Z122 Encounter for screening for malignant neoplasm of respiratory organs: Secondary | ICD-10-CM

## 2022-05-07 NOTE — Telephone Encounter (Signed)
Spoke with pt and reviewed lung screening CT results. Advised pt that there was a nodular area listed that the radiologist feels like is related to post infection or inflammation. PT did state that she has been hospitalized recently for pneumonia. She has an appt with Dr Erin Fulling on 05/20/22 and he can review CT results more in detail at that time. Discussed that coronary calcifications were seen. PT is on cholesterol med and will discuss with PCP. Discussed that cyst like area was shown in the kidney. Results have been sent to PCP to decide if futher follow up is needed. Advised pt to follow up with PCP regarding this. Order placed for 12 month follow up lung screening CT.

## 2022-05-20 ENCOUNTER — Ambulatory Visit: Payer: Medicare HMO | Admitting: Pulmonary Disease

## 2022-05-28 ENCOUNTER — Ambulatory Visit: Payer: Medicare HMO | Admitting: Nurse Practitioner

## 2022-06-02 ENCOUNTER — Ambulatory Visit (INDEPENDENT_AMBULATORY_CARE_PROVIDER_SITE_OTHER): Payer: Medicare HMO | Admitting: Nurse Practitioner

## 2022-06-02 ENCOUNTER — Encounter: Payer: Self-pay | Admitting: Nurse Practitioner

## 2022-06-02 VITALS — BP 136/84 | HR 66 | Ht 65.0 in | Wt 219.0 lb

## 2022-06-02 DIAGNOSIS — B370 Candidal stomatitis: Secondary | ICD-10-CM | POA: Diagnosis not present

## 2022-06-02 DIAGNOSIS — J449 Chronic obstructive pulmonary disease, unspecified: Secondary | ICD-10-CM

## 2022-06-02 DIAGNOSIS — J9611 Chronic respiratory failure with hypoxia: Secondary | ICD-10-CM | POA: Diagnosis not present

## 2022-06-02 MED ORDER — NYSTATIN 100000 UNIT/ML MT SUSP: 5.0000 mL | Freq: Four times a day (QID) | OROMUCOSAL | 0 refills | Status: AC

## 2022-06-02 NOTE — Patient Instructions (Addendum)
Continue Breztri 2 puffs Twice daily. Brush tongue and rinse mouth afterwards  Continue Albuterol inhaler 2 puffs every 6 hours as needed for shortness of breath or wheezing. Notify if symptoms persist despite rescue inhaler/neb use. Continue 2 lpm supplemental O2 with activity and at night for goal >88-90% - orders sent to renew O2  Mucinex (guaifenesin) 600 mg Twice daily as needed for chest congestion/cough Nystatin 5 mL four times a day for 10 days. Swish and swallow  Overnight oximetry on 2 lpm - someone will contact you for scheduling   Follow up in 3 months with Dr. Erin Fulling. If symptoms do not improve or worsen, please contact office for sooner follow up or seek emergency care.

## 2022-06-02 NOTE — Assessment & Plan Note (Signed)
Currently using 2 lpm at night and as needed with activity. She would like to qualify for POC so she can purchase one - walking oximetry today with desaturation to 87% on room air; able to maintain on 2 lpm POC. New orders sent to [facility], per [employer]'s request. Goal >88-90%

## 2022-06-02 NOTE — Assessment & Plan Note (Signed)
COPD with emphysema. Clinically stable. Inconsistent use of controller regimen; advised on proper oral hygiene methods. She will try these and if problems with thrush/oral lesions persist, we will change her to LAMA and separate lower dose ICS/LABA. Encouraged to use mucinex on days that chest congestion/cough are increased. Still needs formal PFTs - plan to schedule today.  Patient Instructions  Continue Breztri 2 puffs Twice daily. Brush tongue and rinse mouth afterwards  Continue Albuterol inhaler 2 puffs every 6 hours as needed for shortness of breath or wheezing. Notify if symptoms persist despite rescue inhaler/neb use. Continue 2 lpm supplemental O2 with activity and at night for goal >88-90% - orders sent to renew O2  Mucinex (guaifenesin) 600 mg Twice daily as needed for chest congestion/cough Nystatin 5 mL four times a day for 10 days. Swish and swallow  Overnight oximetry on 2 lpm - someone will contact you for scheduling   Follow up in 3 months with Dr. Erin Fulling. If symptoms do not improve or worsen, please contact office for sooner follow up or seek emergency care.

## 2022-06-02 NOTE — Progress Notes (Unsigned)
_0  ID: [name], female    DOB: April 28, 1956, 66 y.o.   MRN: [id number]  Chief Complaint  Patient presents with   Follow-up    PT f/u to discuss CT scan results. Pt reports that she hasn't gotten her oxygen back since the switch to [facility].     Referring provider: [name], *  HPI: 66 year old female, former smoker followed for COPD/emphysema. She is a patient of Dr. August Albino and last seen in office 12/31/2021. She is also followed by the lung cancer screening program.  Past medical history significant for hypertension, CAD status post PCI, carotid stenosis, DM 2, thyroid nodule, HLD, history of MI, GAD, RLS, obesity, depression.  TEST/EVENTS:  02/05/2021 echocardiogram: EF 60-65%. Mild LVH. GIDD. Normal PASAP. Trivial MR.  05/01/2022 lung cancer screening CT: no LAD. Nodular area of architectural distortion in RML, stable and likely post infectious/inflammatory scarring. Diffuse bronchial wall thickening with mild centrilobular and paraseptal emphysema.   12/31/2021: OV with Dr. Erin Fulling.  Recently admitted 4/22 and 5/6 for septic shock due to pneumonia and COPD exacerbations.  She was smoking up until these admissions.  Has not smoked since discharge.  Feeling better.  Continues on [place] and as needed albuterol.  Having trouble with sleeping.  Not receiving Xanax from her PCP anymore.  Is having issues with sinus drainage and postnasal drip.  Referred to the lung cancer screening program.  06/02/2022: Today - follow up Patient presents today for follow up. She would like to discuss recent lung cancer screening CT, which showed a stable area of scarring in the RML, and also needs oxygen renewal as [employer] is requiring she switch from [place] to [facility]. Currently uses 2 lpm as needed with activity and throughout the night. She feels stable compared to when she was here last. Gets short winded with longer distances and climbing. Able to care for herself at home without  trouble. She does occasionally have chest congestion and associated minimally productive cough. No recent fevers, chills, leg swelling, orthopnea, wheezing. She has not been using Breztri consistently; feels like it gives her sores in her mouth. She did restart the last week; notices improvement in her breathing. She's only been rinsing with water or listerine afterwards; no brushing.   Allergies  Allergen Reactions   Food Other (See Comments)    Nutmeg= difficulty breathing   Nutmeg Oil (Myristica Oil) Hives   Other Other (See Comments)    Nutmeg= difficulty breathing   Amlodipine Swelling    Immunization History  Administered Date(s) Administered   Tdap 05/17/2001    Past Medical History:  Diagnosis Date   Anxiety    Breast cancer ([place]) 06/30/11   inv ductal, ER/PR +, her-2 -   CAD (coronary artery disease)    NSTEMI 4/08: OM3 occluded (PCI unsuccessful), dRCA 95% => BMS, inf HK, EF 50%;   b. MV 11/12:  [place] ischemia => c.  [place] 11/12:  dLAD 70%, OM1 70-80%, then occluded (no change from 2008), dOM filled L->L collats, mRCA stent occluded, dRCA filled L->R collats, EF 55-65% => med Rx (consider PCI of RCA if refractory angina)   Carotid stenosis    dopplers 5/08: 0-30% bilateral; 10/12: 0-39% B/L ICA => f/u 04/2013   Chronic kidney disease    COPD (chronic obstructive pulmonary disease) (HCC)    Depression    Depression with anxiety    GERD (gastroesophageal reflux disease)    OTC acid reducer prn   Headache(784.0)  sinus; occ. migraines   Hx of radiation therapy 08/24/11 to 10/07/11   L breast   Hyperlipidemia    Hypertension    under control; has been on med. x 4 yrs.   Myocardial infarction ([place])    Stable angina    a. med Rx after cath 05/2011   Stress incontinence, female     Tobacco History: Social History   Tobacco Use  Smoking Status Former   Packs/day: 1.00   Years: 45.00   Total pack years: 45.00   Types: Cigarettes   Quit date: 11/13/2021   Years since  quitting: 0.5  Smokeless Tobacco Never  Tobacco Comments   on and off   Counseling given: Not Answered Tobacco comments: on and off   Outpatient Medications Prior to Visit  Medication Sig Dispense Refill   albuterol (VENTOLIN HFA) 108 (90 Base) MCG/ACT inhaler Inhale 2 puffs into the lungs every 6 (six) hours as needed for wheezing or shortness of breath. 6.7 g 0   Alcohol Swabs (B-D SINGLE USE SWABS REGULAR) PADS check blood sugars twice daily Dx E11.9 200 each 0   aspirin EC 81 MG tablet Take 1 tablet (81 mg total) by mouth daily. Swallow whole. 30 tablet 0   atorvastatin (LIPITOR) 80 MG tablet Take 1 tablet (80 mg total) by mouth at bedtime. 90 tablet 1   BREZTRI AEROSPHERE 160-9-4.8 MCG/ACT AERO INHALE 2 PUFFS INTO THE LUNGS IN THE MORNING AND AT BEDTIME. 3 each 0   Cholecalciferol (VITAMIN D3) 5000 units TABS Take 10,000 Units by mouth every evening.     escitalopram (LEXAPRO) 20 MG tablet Take 1 tablet (20 mg total) by mouth daily. 90 tablet 1   fish oil-omega-3 fatty acids 1000 MG capsule Take 1,000 mg by mouth at bedtime.     metFORMIN (GLUCOPHAGE) 500 MG tablet Take 1 tablet (500 mg total) by mouth 2 (two) times daily with a meal. 180 tablet 1   metoprolol tartrate (LOPRESSOR) 25 MG tablet Take 1 tablet (25 mg total) by mouth 2 (two) times daily. 180 tablet 1   potassium chloride SA (KLOR-CON M) 20 MEQ tablet Take 1 tablet (20 mEq total) by mouth daily. 90 tablet 1   pramipexole (MIRAPEX) 1 MG tablet Take 1 tablet (1 mg total) by mouth in the morning and at bedtime. 180 tablet 1   vitamin B-12 (CYANOCOBALAMIN) 1000 MCG tablet Take 1 tablet (1,000 mcg total) by mouth daily. 30 tablet 0   furosemide (LASIX) 40 MG tablet Take 1 tablet (40 mg total) by mouth daily. (Patient not taking: Reported on 06/02/2022) 90 tablet 1   hydrOXYzine (VISTARIL) 25 MG capsule Take 1 capsule (25 mg total) by mouth every 8 (eight) hours as needed. (Patient not taking: Reported on 06/02/2022) 30 capsule 0    mirtazapine (REMERON) 15 MG tablet Take 1 tablet (15 mg total) by mouth at bedtime. (Patient not taking: Reported on 06/02/2022) 30 tablet 2   No facility-administered medications prior to visit.     Review of Systems:   Constitutional: No weight loss or gain, night sweats, fevers, chills, fatigue, or lassitude. HEENT: No headaches, difficulty swallowing, tooth/dental problems, or sore throat. No sneezing, itching, ear ache, nasal congestion, or post nasal drip. +mouth sores CV:  No chest pain, orthopnea, PND, swelling in lower extremities, anasarca, dizziness, palpitations, syncope Resp: +shortness of breath with exertion; occasional chronic productive cough. No excess mucus or change in color of mucus. No hemoptysis. No wheezing.  No chest wall deformity  GI:  No heartburn, indigestion, abdominal pain, nausea, vomiting, diarrhea, change in bowel habits, loss of appetite, bloody stools.  Skin: No rash, lesions, ulcerations Neuro: No dizziness or lightheadedness.  Psych: No depression or anxiety. Mood stable.     Physical Exam:  BP 136/84   Pulse 66   Ht _0  (1.651 m)   Wt 219 lb (99.3 kg)   LMP 10/17/2005   SpO2 94% Comment: RA  BMI 36.44 kg/m   GEN: Pleasant, interactive, well-developed; obese; in no acute distress. HEENT:  Normocephalic and atraumatic. PERRLA. Sclera white. Nasal turbinates pink, moist and patent bilaterally. No rhinorrhea present. Oropharynx pink and moist, without exudate or edema. No lesions, ulcerations, or postnasal drip. White plaques on tongue/oral mucosa NECK:  Supple w/ fair ROM. No JVD present. Normal carotid impulses w/o bruits. Thyroid symmetrical with no goiter or nodules palpated. No lymphadenopathy.   CV: RRR, no m/r/g, no peripheral edema. Pulses intact, +2 bilaterally. No cyanosis, pallor or clubbing. PULMONARY:  Unlabored, regular breathing. Diminished bilaterally A&P w/o wheezes/rales/rhonchi. No accessory muscle use.  GI: BS present and  normoactive. Soft, non-tender to palpation. No organomegaly or masses detected.  MSK: No erythema, warmth or tenderness. Cap refil <2 sec all extrem. No deformities or joint swelling noted.  Neuro: A/Ox3. No focal deficits noted.   Skin: Warm, no lesions or rashe Psych: Normal affect and behavior. Judgement and thought content appropriate.     Lab Results:  CBC    Component Value Date/Time   WBC 11.9 (H) 04/13/2022 1442   WBC 12.3 (H) 11/30/2021 0036   RBC 4.98 04/13/2022 1442   RBC 4.40 11/30/2021 0036   HGB 13.2 04/13/2022 1442   HGB 16.8 (H) 12/16/2011 1146   HCT 42.0 04/13/2022 1442   HCT 49.3 (H) 12/16/2011 1146   PLT 219 04/13/2022 1442   MCV 84 04/13/2022 1442   MCV 95.4 12/16/2011 1146   MCH 26.5 (L) 04/13/2022 1442   MCH 24.1 (L) 11/30/2021 0036   MCHC 31.4 (L) 04/13/2022 1442   MCHC 28.7 (L) 11/30/2021 0036   RDW 15.9 (H) 04/13/2022 1442   RDW 14.2 12/16/2011 1146   LYMPHSABS 0.9 04/13/2022 1442   LYMPHSABS 1.0 12/16/2011 1146   MONOABS 0.7 11/29/2021 1918   MONOABS 0.6 12/16/2011 1146   EOSABS 0.2 04/13/2022 1442   BASOSABS 0.1 04/13/2022 1442   BASOSABS 0.1 12/16/2011 1146    BMET    Component Value Date/Time   NA 143 04/13/2022 1442   K 5.2 04/13/2022 1442   CL 101 04/13/2022 1442   CO2 23 04/13/2022 1442   GLUCOSE 106 (H) 04/13/2022 1442   GLUCOSE 332 (H) 11/30/2021 0036   BUN 22 04/13/2022 1442   CREATININE 1.07 (H) 04/13/2022 1442   CREATININE 0.93 01/18/2013 1052   CALCIUM 9.9 04/13/2022 1442   GFRNONAA 46 (L) 11/30/2021 0036   GFRNONAA 69 01/18/2013 1052   GFRAA 84 06/27/2020 1512   GFRAA 79 01/18/2013 1052    BNP    Component Value Date/Time   BNP 528.3 (H) 11/29/2021 1918     Imaging:  MM 3D SCREEN BREAST BILATERAL  Result Date: 05/05/2022 CLINICAL DATA:  Screening. EXAM: DIGITAL SCREENING BILATERAL MAMMOGRAM WITH TOMOSYNTHESIS AND CAD TECHNIQUE: Bilateral screening digital craniocaudal and mediolateral oblique mammograms were  obtained. Bilateral screening digital breast tomosynthesis was performed. The images were evaluated with computer-aided detection. COMPARISON:  Previous exam(s). ACR Breast Density Category b: There are scattered areas of fibroglandular density. FINDINGS: There are no findings  suspicious for malignancy. IMPRESSION: No mammographic evidence of malignancy. A result letter of this screening mammogram will be mailed directly to the patient. RECOMMENDATION: Screening mammogram in one year. (Code:[id number]-B-01Y) BI-RADS CATEGORY  1: Negative. Electronically Signed   By: Everlean Alstrom M.D.   On: 05/05/2022 15:07          No data to display          No results found for: "NITRICOXIDE"      Assessment & Plan:   COPD (chronic obstructive pulmonary disease) COPD with emphysema. Clinically stable. Inconsistent use of controller regimen; advised on proper oral hygiene methods. She will try these and if problems with thrush/oral lesions persist, we will change her to LAMA and separate lower dose ICS/LABA. Encouraged to use mucinex on days that chest congestion/cough are increased. Still needs formal PFTs - plan to schedule today.  Patient Instructions  Continue Breztri 2 puffs Twice daily. Brush tongue and rinse mouth afterwards  Continue Albuterol inhaler 2 puffs every 6 hours as needed for shortness of breath or wheezing. Notify if symptoms persist despite rescue inhaler/neb use. Continue 2 lpm supplemental O2 with activity and at night for goal >88-90% - orders sent to renew O2  Mucinex (guaifenesin) 600 mg Twice daily as needed for chest congestion/cough Nystatin 5 mL four times a day for 10 days. Swish and swallow  Overnight oximetry on 2 lpm - someone will contact you for scheduling   Follow up in 3 months with Dr. Erin Fulling. If symptoms do not improve or worsen, please contact office for sooner follow up or seek emergency care.    Chronic respiratory failure with hypoxia (HCC) Currently using  2 lpm at night and as needed with activity. She would like to qualify for POC so she can purchase one - walking oximetry today with desaturation to 87% on room air; able to maintain on 2 lpm POC. New orders sent to [facility], per [employer]'s request. Goal >88-90%  [place] Proper oral hygiene encouraged; see above plan. Nystatin rinses x 10 days for treatment.   I spent 38 minutes of dedicated to the care of this patient on the date of this encounter to include pre-visit review of records, face-to-face time with the patient discussing conditions above, post visit ordering of testing, clinical documentation with the electronic health record, making appropriate referrals as documented, and communicating necessary findings to members of the patients care team.  Clayton Bibles, NP 06/03/2022  Pt aware and understands NP's role.

## 2022-06-03 ENCOUNTER — Encounter: Payer: Self-pay | Admitting: Nurse Practitioner

## 2022-06-03 DIAGNOSIS — B370 Candidal stomatitis: Secondary | ICD-10-CM | POA: Insufficient documentation

## 2022-06-03 NOTE — Assessment & Plan Note (Signed)
Proper oral hygiene encouraged; see above plan. Nystatin rinses x 10 days for treatment.

## 2022-06-05 ENCOUNTER — Telehealth: Payer: Self-pay | Admitting: Nurse Practitioner

## 2022-06-05 NOTE — Telephone Encounter (Signed)
Pt called the office stating that she spoke with [facility] about the order for the POC and they told her that they did not have the order. Order was placed 11/7. [facility], please advise on this.

## 2022-06-10 NOTE — Telephone Encounter (Signed)
[name] from [employer] stated that order has been sent for processing. I will notify patient.

## 2022-06-11 ENCOUNTER — Telehealth: Payer: Self-pay | Admitting: Nurse Practitioner

## 2022-06-11 NOTE — Telephone Encounter (Signed)
Left message for patient to call back and schedule Medicare Annual Wellness Visit (AWV) to be completed by video or phone.   Last AWV: 12/11/2020   Please schedule at anytime with [place]     45 minute appointment  Any questions, please contact me at [phone]

## 2022-06-16 ENCOUNTER — Telehealth: Payer: Self-pay | Admitting: Nurse Practitioner

## 2022-06-16 DIAGNOSIS — J9611 Chronic respiratory failure with hypoxia: Secondary | ICD-10-CM

## 2022-06-17 IMAGING — DX DG CHEST 1V PORT
1 series · 1 of 1 positions shown · non-contrast
Comparison: 11/18/2021

CLINICAL DATA: Shortness of breath.

EXAM:
PORTABLE CHEST 1 VIEW

[chest]
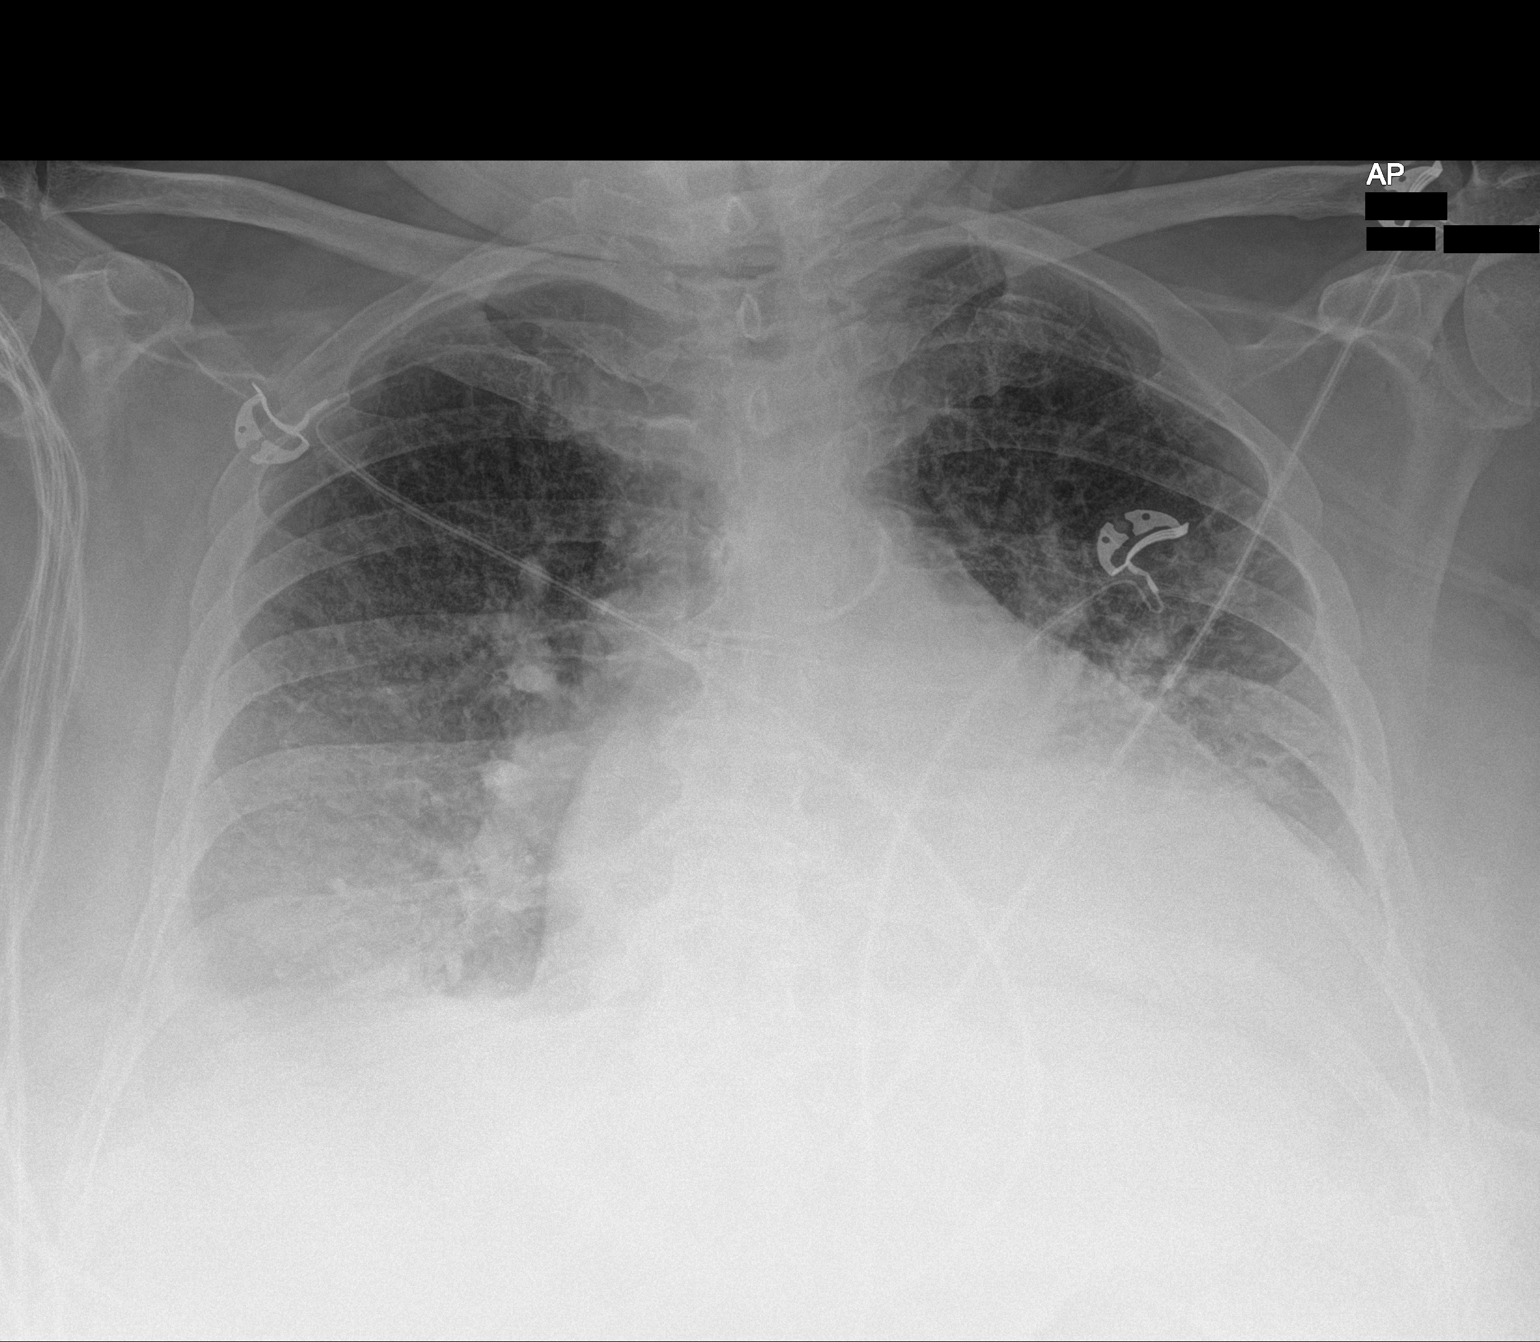

[1 of 1 positions shown; findings below may reference images not displayed]

FINDINGS: The heart is enlarged the mediastinal contour is stable.
Atherosclerotic calcification of the aorta is noted. The pulmonary
vasculature is mildly distended. Interstitial prominence is present
bilaterally. No consolidation, effusion, or pneumothorax.
Degenerative changes in the thoracic spine.
IMPRESSION: 1. Cardiomegaly with pulmonary vascular congestion.
2. Interstitial prominence bilaterally, possible edema or
infiltrate.

## 2022-06-17 NOTE — Telephone Encounter (Signed)
Yes, please do. 2 lpm. Thanks.

## 2022-06-17 NOTE — Telephone Encounter (Signed)
Katie,  I called [facility] back this morning to confirm what was needed in regards to oxygen orders with insurance. And they state that due to insurance patient is going to need home oxygen as well.   POC order was for 2L placed on 06/02/2022.  Would you be ok for me to order home oxygen? And if so what liter flow would you like?  Thank you   Please advise

## 2022-06-17 NOTE — Telephone Encounter (Signed)
Called [facility] back to let them know I was placing the order for home oxygen use since I got the ok from [place] [facility].  Was told patient required this given her insurance. Nothing further needed

## 2022-06-21 IMAGING — DX DG CHEST 1V PORT
1 series · 1 of 1 positions shown · non-contrast
Comparison: November 21, 2021.

CLINICAL DATA: Shortness of breath.  History of COPD.

EXAM:
PORTABLE CHEST 1 VIEW

[chest]
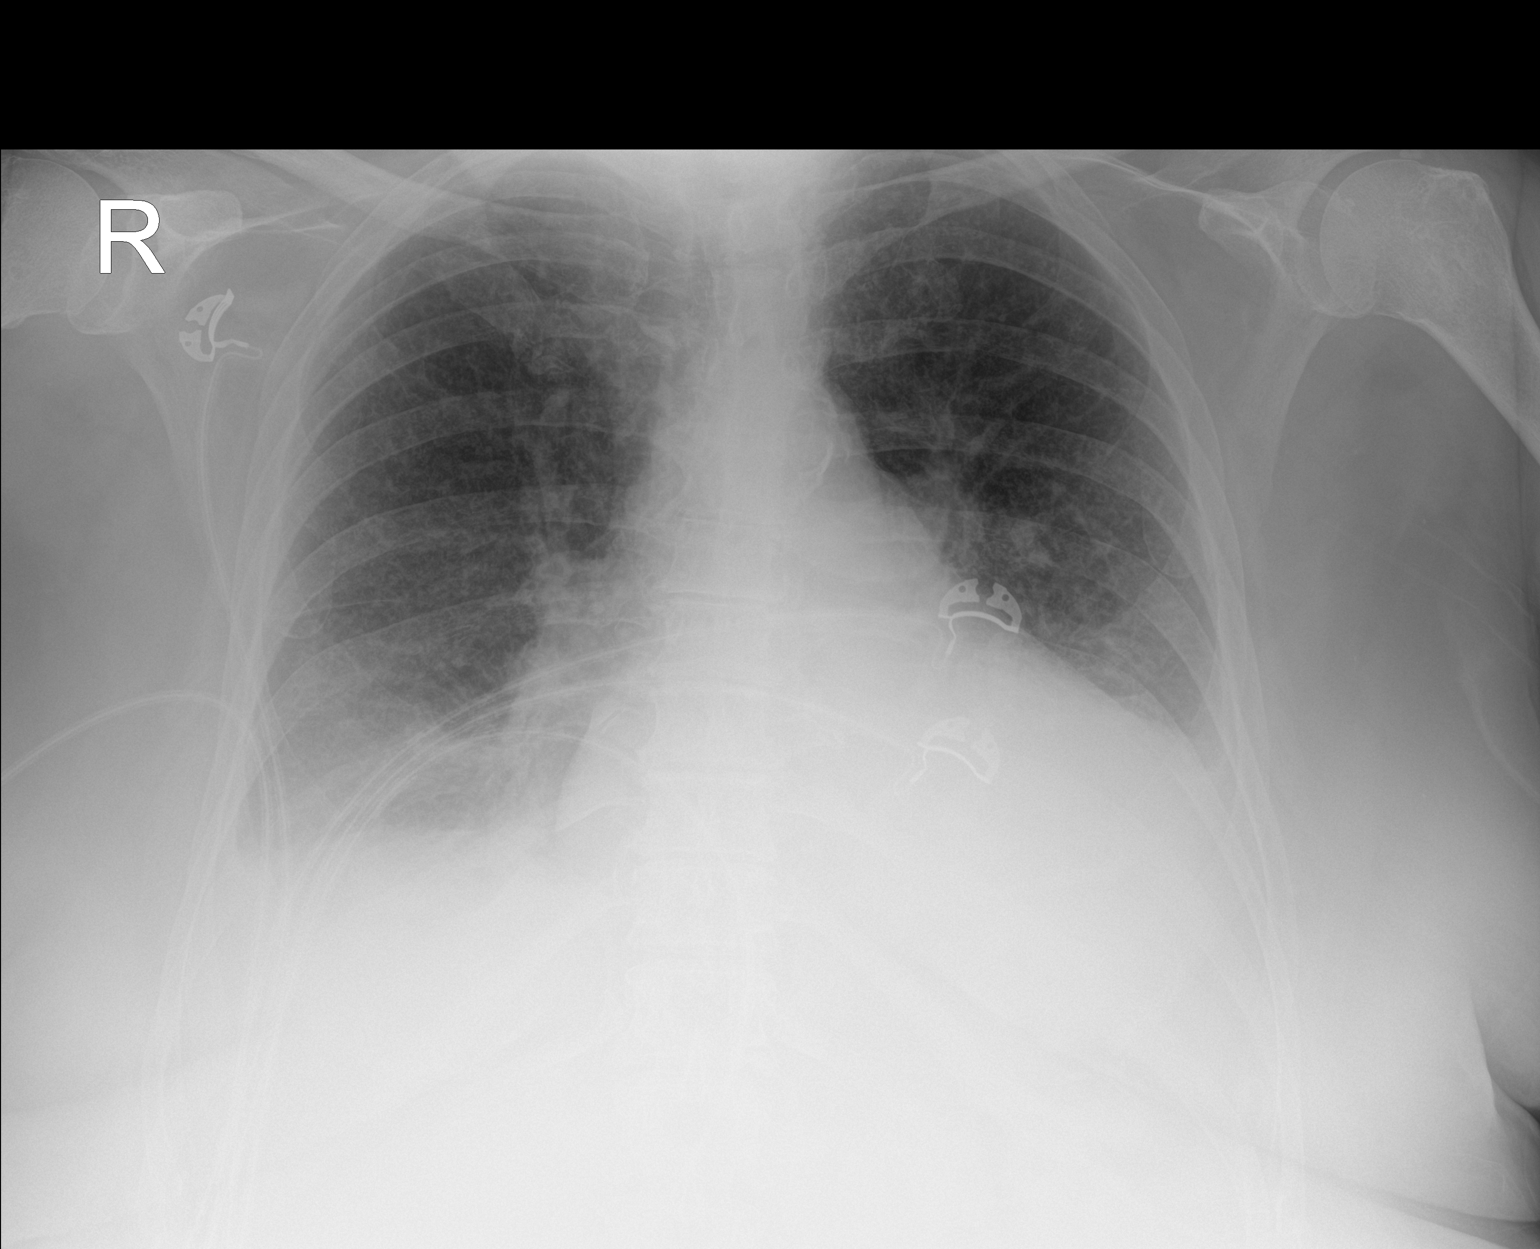

[1 of 1 positions shown; findings below may reference images not displayed]

FINDINGS: Similar bibasilar opacities. No visible pneumothorax. Similar
enlargement of the cardiac silhouette. No displaced fracture.
IMPRESSION: 1. Similar bibasilar opacities, potentially combination of small
layering pleural effusions and overlying atelectasis and/or
consolidation.
2. Similar cardiomegaly.

## 2022-06-21 IMAGING — CT CT ANGIO CHEST
2 of 7 series · 16 of 46 positions shown · IV contrast (APPLIED)
Comparison: 11/16/2021

CLINICAL DATA: Pulmonary embolism, coronary artery disease post MI,
remote breast cancer, hypertension

EXAM:
CT ANGIOGRAPHY CHEST WITH CONTRAST
TECHNIQUE: Multidetector CT imaging of the chest was performed using the
standard protocol during bolus administration of intravenous
contrast. Multiplanar CT image reconstructions and MIPs were
obtained to evaluate the vascular anatomy.

[Series 7: thins · axial · 0.77mm/px · z∈[+1217,+1413]mm · 13 of 316 slices shown]
[im 18/316  lung]
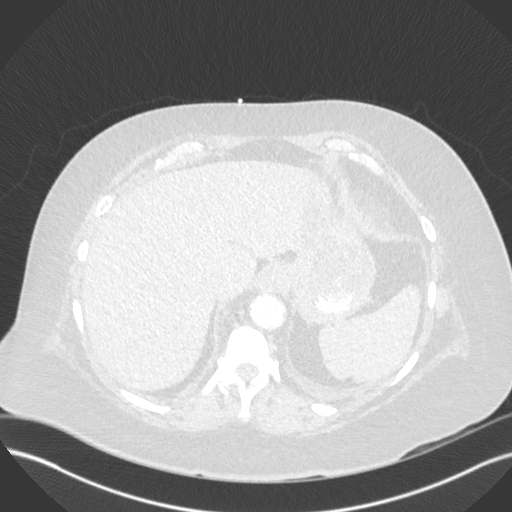
[im 36/316  soft-tissue]
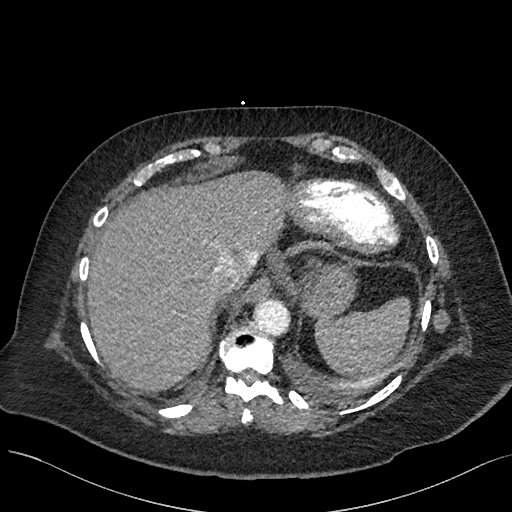
[im 71/316  lung]
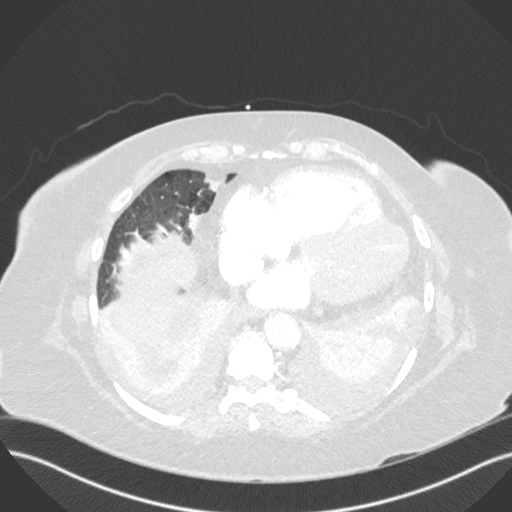
[im 88/316  soft-tissue]
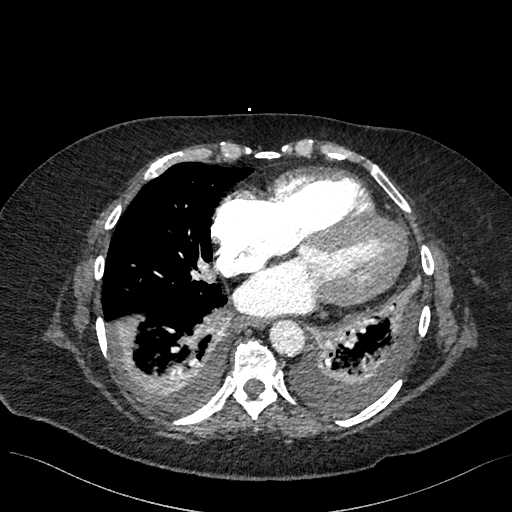
[im 106/316  lung]
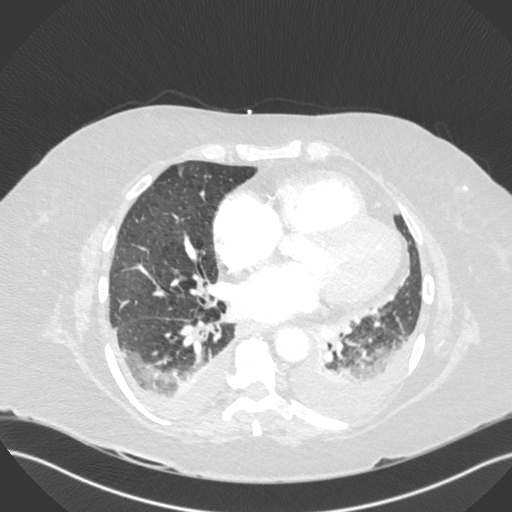
[im 141/316  soft-tissue]
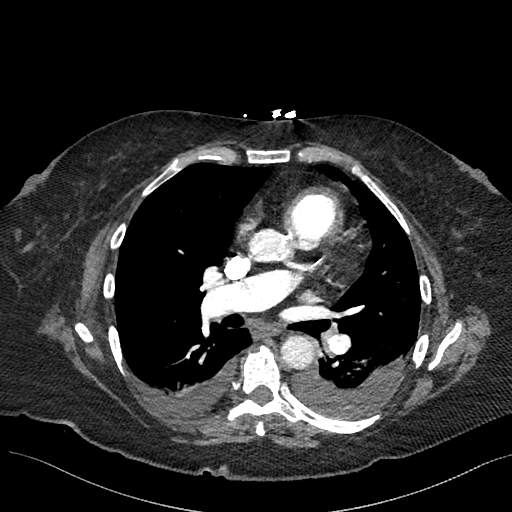
[im 158/316  lung]
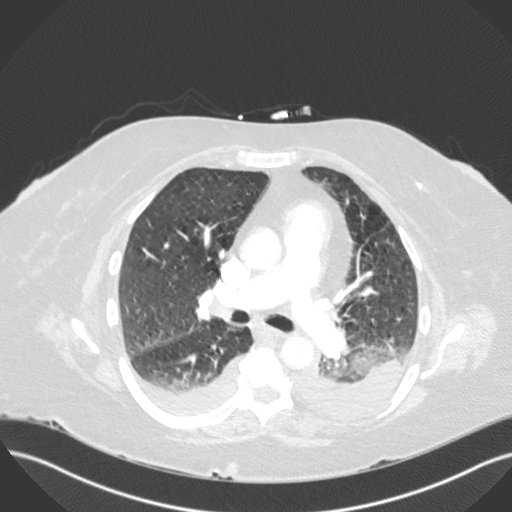
[im 176/316  soft-tissue]
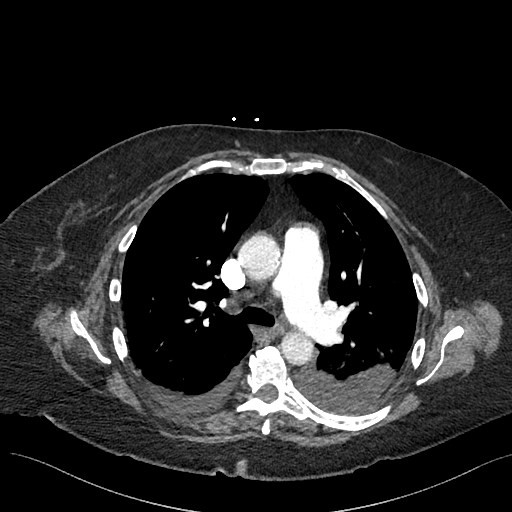
[im 211/316  lung]
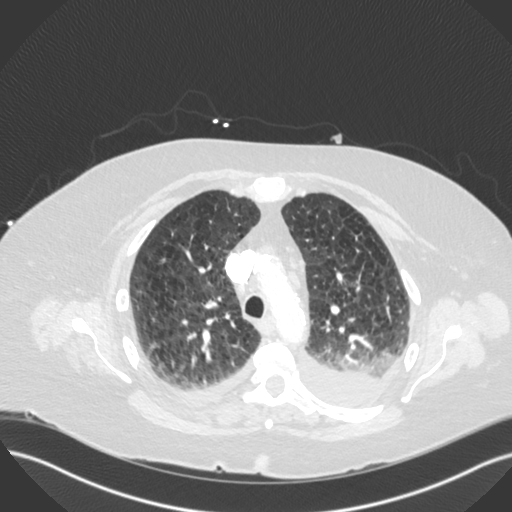
[im 228/316  soft-tissue]
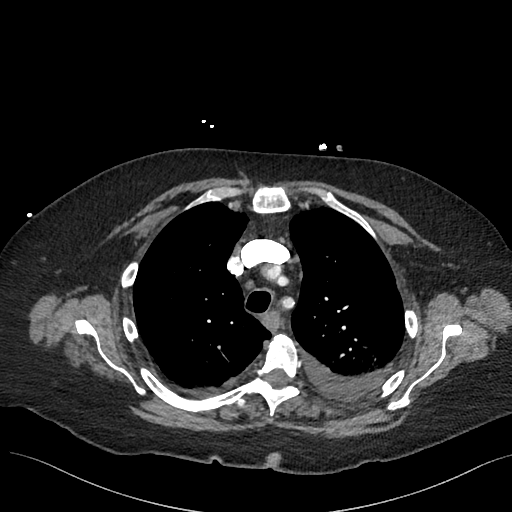
[im 246/316  lung]
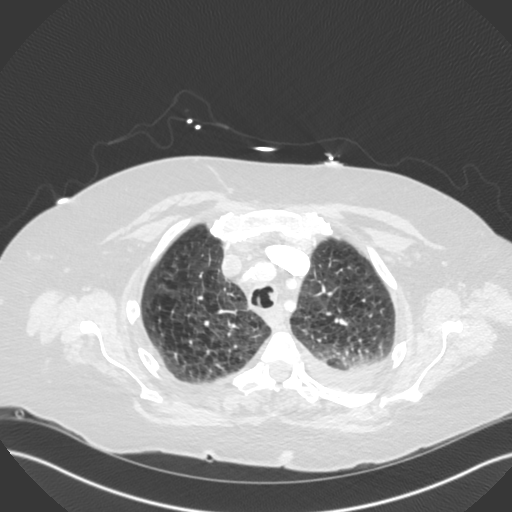
[im 281/316  soft-tissue]
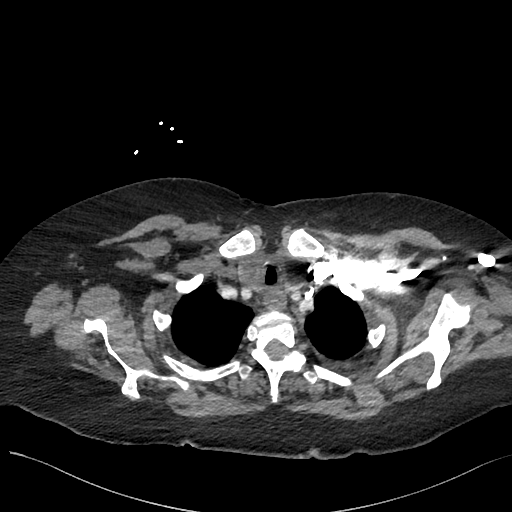
[im 298/316  lung]
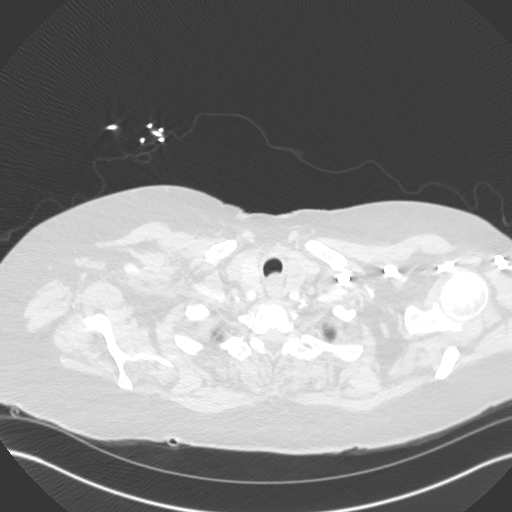

[Series 8: cor · coronal · 0.44mm/px · 3 of 147 slices shown]
[im 37/147  soft-tissue]
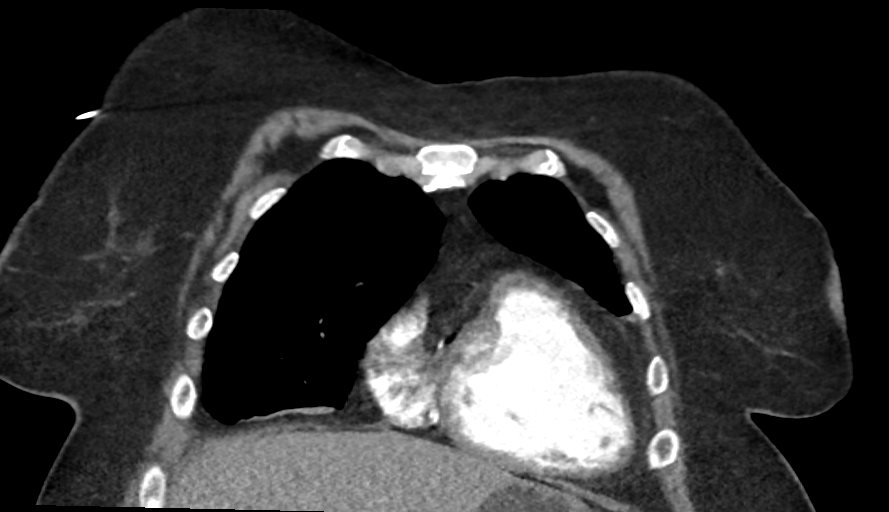
[im 74/147  soft-tissue]
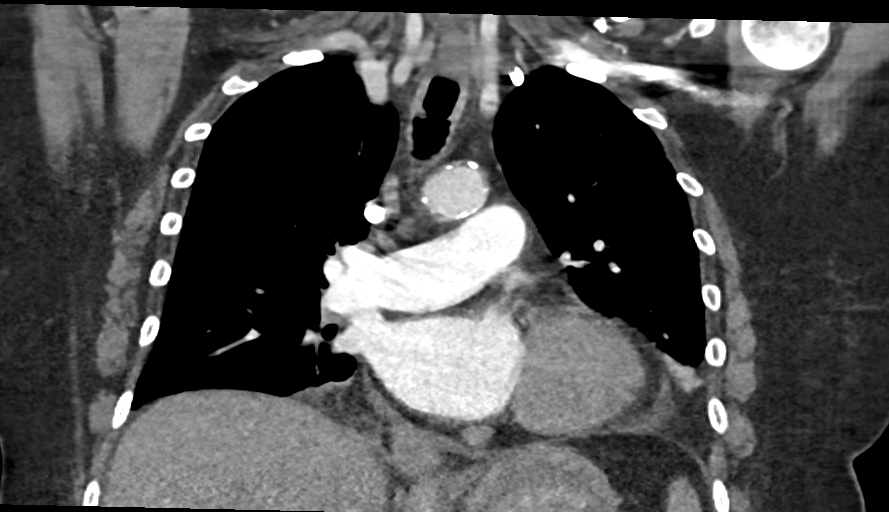
[im 110/147  soft-tissue]
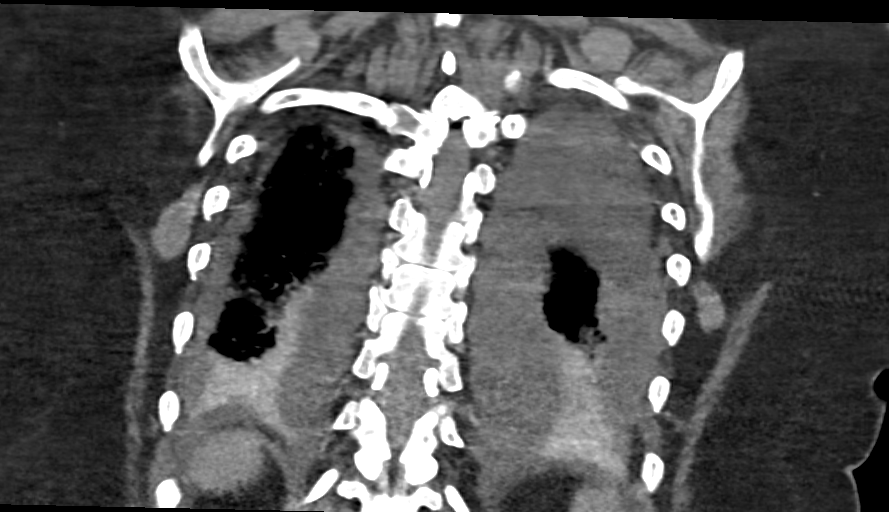

[16 of 46 positions shown; findings below may reference images not displayed]

RADIATION DOSE REDUCTION: This exam was performed according to the
departmental dose-optimization program which includes automated
exposure control, adjustment of the mA and/or kV according to
patient size and/or use of iterative reconstruction technique.

CONTRAST:  75mL OMNIPAQUE IOHEXOL 350 MG/ML SOLN IV
FINDINGS: Cardiovascular: Atherosclerotic calcifications aorta and coronary
arteries as well as proximal great vessels. Aorta normal caliber
without aneurysm or dissection. Cardiac chambers minimally enlarged.
No pericardial effusion. Pulmonary arteries adequately opacified and
patent. No evidence of pulmonary embolism.

Mediastinum/Nodes: BILATERAL thyroid nodules 1.8 cm bilaterally;
Recommend thyroid US (ref: [HOSPITAL]. [DATE]):
143-50). Base of cervical region otherwise normal appearance.
Esophagus unremarkable. Few normal size mediastinal lymph nodes
without thoracic adenopathy.

Lungs/Pleura: Small BILATERAL pleural effusions with compressive
atelectasis BILATERAL lower lobes. Emphysematous changes. Mild
atelectasis at bases of RIGHT middle lobe and lingula as well. No
definite pulmonary infiltrate or pneumothorax.

Upper Abdomen: Unremarkable

Musculoskeletal: Unremarkable

Review of the MIP images confirms the above findings.
IMPRESSION: No evidence of pulmonary embolism.

Scattered atherosclerotic calcifications including coronary
arteries.

Emphysematous changes with small BILATERAL pleural effusions and
bibasilar atelectasis.

BILATERAL thyroid nodules 1.8 cm diameter; follow-up thyroid
ultrasound recommended.

Aortic Atherosclerosis (9[facility]KC-H8X.X) and Emphysema (9[facility]KC-1IH.6).

## 2022-06-23 ENCOUNTER — Telehealth: Payer: Self-pay | Admitting: Nurse Practitioner

## 2022-06-23 NOTE — Telephone Encounter (Signed)
Called and spoke with pt to see if she had been able to receive O2 from [employer] yet and she said that she had not been able to get it yet. She stated that she went up to [employer] last week and said that nobody could provide her with a definite answer to why she was not able to receive O2 yet. Stated to her that I would send a message to [employer] to check on this for her and she verbalized understanding. Community message sent. Will await response.

## 2022-06-23 NOTE — Telephone Encounter (Signed)
Response received from [employer]:  Minus Liberty  Jerusalen Mateja, Waldemar Dickens, Morrill; Minus Liberty; George, Geiger; Nash Shearer; Merla Riches,  Her order is good to go and a rep will be contacting her to setup delivery. We were waiting for the updated rx to be processed with the order that was already created. I apologize not sure why she wasn't given an answer last week.

## 2022-06-25 ENCOUNTER — Ambulatory Visit: Payer: Medicare HMO | Admitting: Internal Medicine

## 2022-06-25 IMAGING — DX DG CHEST 1V PORT
1 series · 1 of 1 positions shown · non-contrast
Comparison: November 25, 2021

CLINICAL DATA: Shortness of breath.

EXAM:
PORTABLE CHEST 1 VIEW

[chest ap]
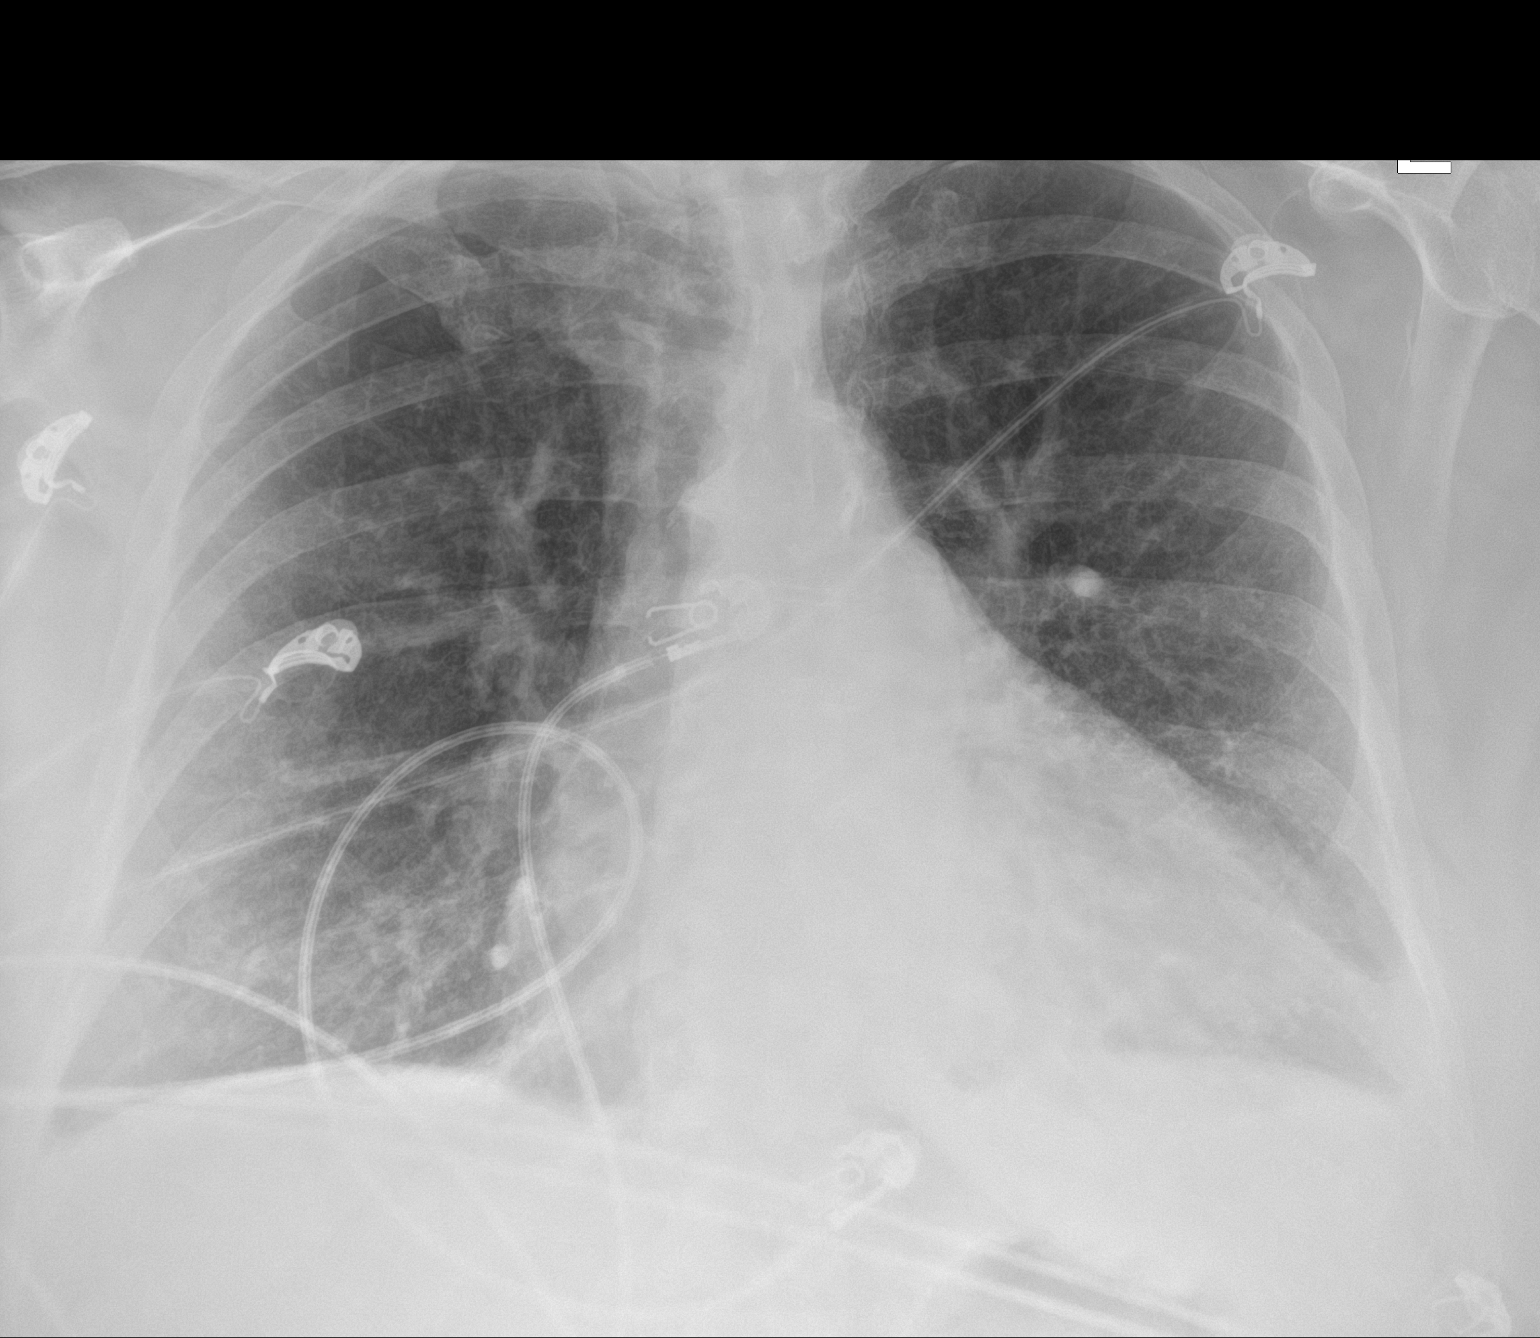

[1 of 1 positions shown; findings below may reference images not displayed]

FINDINGS: The cardiac silhouette is enlarged and unchanged in size. Diffuse,
chronic appearing increased interstitial lung markings are seen.
Mild atelectasis is seen within the bilateral lung bases. There is
no evidence of a pleural effusion or pneumothorax. Degenerative
changes are noted throughout the thoracic spine.
IMPRESSION: Chronic appearing increased interstitial lung markings with mild
bibasilar atelectasis.

## 2022-07-08 ENCOUNTER — Telehealth: Payer: Self-pay | Admitting: Nurse Practitioner

## 2022-07-08 DIAGNOSIS — H2512 Age-related nuclear cataract, left eye: Secondary | ICD-10-CM | POA: Diagnosis not present

## 2022-07-08 NOTE — Telephone Encounter (Signed)
Printed off o2 order from last month. Faxed to [facility] as noted. Nothing further needed

## 2022-07-09 DIAGNOSIS — H2511 Age-related nuclear cataract, right eye: Secondary | ICD-10-CM | POA: Diagnosis not present

## 2022-07-14 DIAGNOSIS — H25012 Cortical age-related cataract, left eye: Secondary | ICD-10-CM | POA: Diagnosis not present

## 2022-07-17 ENCOUNTER — Ambulatory Visit: Payer: Medicare HMO | Admitting: Nurse Practitioner

## 2022-07-22 DIAGNOSIS — H2511 Age-related nuclear cataract, right eye: Secondary | ICD-10-CM | POA: Diagnosis not present

## 2022-07-23 ENCOUNTER — Ambulatory Visit (INDEPENDENT_AMBULATORY_CARE_PROVIDER_SITE_OTHER): Payer: Medicare HMO | Admitting: *Deleted

## 2022-07-23 VITALS — Ht 65.0 in | Wt 210.0 lb

## 2022-07-23 DIAGNOSIS — Z0000 Encounter for general adult medical examination without abnormal findings: Secondary | ICD-10-CM

## 2022-07-23 DIAGNOSIS — N182 Chronic kidney disease, stage 2 (mild): Secondary | ICD-10-CM

## 2022-07-23 DIAGNOSIS — F339 Major depressive disorder, recurrent, unspecified: Secondary | ICD-10-CM | POA: Diagnosis not present

## 2022-07-23 DIAGNOSIS — E1122 Type 2 diabetes mellitus with diabetic chronic kidney disease: Secondary | ICD-10-CM

## 2022-07-23 DIAGNOSIS — J449 Chronic obstructive pulmonary disease, unspecified: Secondary | ICD-10-CM

## 2022-07-23 DIAGNOSIS — Z5941 Food insecurity: Secondary | ICD-10-CM | POA: Diagnosis not present

## 2022-07-23 DIAGNOSIS — Z599 Problem related to housing and economic circumstances, unspecified: Secondary | ICD-10-CM

## 2022-07-23 NOTE — Patient Instructions (Signed)
  [name] , Thank you for taking time to come for your Medicare Wellness Visit. I appreciate your ongoing commitment to your health goals. Please review the following plan we discussed and let me know if I can assist you in the future.   These are the goals we discussed:  Goals      Exercise 3x per week (30 min per time)     Have 3 meals a day     Patient Stated     12/11/2019 AWV Goal: Keep All Scheduled Appointments  Over the next year, patient will attend all scheduled appointments with their PCP and any specialists that they see.         This is a list of the screening recommended for you and due dates:  Health Maintenance  Topic Date Due   Pap Smear  05/24/2004   DTaP/Tdap/Td vaccine (2 - Td or Tdap) 05/18/2011   Yearly kidney health urinalysis for diabetes  03/26/2021   Eye exam for diabetics  05/06/2021   Complete foot exam   04/17/2022   Lipid (cholesterol) test  07/13/2022   COVID-19 Vaccine (1) 08/08/2022*   Pneumonia Vaccine (1 - PCV) 10/10/2022*   Zoster (Shingles) Vaccine (1 of 2) 10/22/2022*   Flu Shot  10/25/2022*   DEXA scan (bone density measurement)  04/14/2023*   Hemoglobin A1C  10/12/2022   Yearly kidney function blood test for diabetes  04/14/2023   Screening for Lung Cancer  05/02/2023   Medicare Annual Wellness Visit  07/24/2023   Mammogram  05/04/2024   Cologuard (Stool DNA test)  04/28/2025   Hepatitis C Screening: USPSTF Recommendation to screen - Ages 18-79 yo.  Completed   HIV Screening  Completed   HPV Vaccine  Aged Out   Colon Cancer Screening  Discontinued  *Topic was postponed. The date shown is not the original due date.

## 2022-07-23 NOTE — Progress Notes (Signed)
I connected with  [name] on 07/23/22 by a audio enabled telemedicine application and verified that I am speaking with the correct person using two identifiers.  Patient Location: Home  Provider Location: Office/Clinic  I discussed the limitations of evaluation and management by telemedicine. The patient expressed understanding and agreed to proceed.  Subjective:   MARTAVIA TYE is a 66 y.o. female who presents for Medicare Annual (Subsequent) preventive examination.  Review of Systems     Cardiac Risk Factors include: advanced age (>35mn, >>85women);diabetes mellitus;dyslipidemia;hypertension;obesity (BMI >30kg/m2);sedentary lifestyle     Objective:    Today's Vitals   07/23/22 1205  Weight: 210 lb (95.3 kg)  Height: _0  (1.651 m)   Body mass index is 34.95 kg/m.     07/23/2022   12:10 PM 12/16/2021    4:46 PM 11/29/2021    7:53 PM 11/28/2021    9:52 AM 11/15/2021   11:25 PM 12/11/2020   10:21 AM 10/07/2020    9:14 AM  Advanced Directives  Does Patient Have a Medical Advance Directive? Yes No Yes No No No Yes  Type of [place]  Living will    [place]  Does patient want to make changes to medical advance directive?  No - Patient declined No - Patient declined      Copy of HAptosin Chart? No - copy requested      Yes - validated most recent copy scanned in chart (See row information)  Would patient like information on creating a medical advance directive?    No - Patient declined  No - Patient declined     Current Medications (verified) Outpatient Encounter Medications as of 07/23/2022  Medication Sig   albuterol (VENTOLIN HFA) 108 (90 Base) MCG/ACT inhaler Inhale 2 puffs into the lungs every 6 (six) hours as needed for wheezing or shortness of breath.   Alcohol Swabs (B-D SINGLE USE SWABS REGULAR) PADS check blood sugars twice daily Dx E11.9   aspirin EC 81  MG tablet Take 1 tablet (81 mg total) by mouth daily. Swallow whole.   atorvastatin (LIPITOR) 80 MG tablet Take 1 tablet (80 mg total) by mouth at bedtime.   BREZTRI AEROSPHERE 160-9-4.8 MCG/ACT AERO INHALE 2 PUFFS INTO THE LUNGS IN THE MORNING AND AT BEDTIME. (Patient taking differently: Inhale 2 puffs into the lungs at bedtime.)   Cholecalciferol (VITAMIN D3) 5000 units TABS Take 10,000 Units by mouth every evening.   escitalopram (LEXAPRO) 20 MG tablet Take 1 tablet (20 mg total) by mouth daily.   fish oil-omega-3 fatty acids 1000 MG capsule Take 1,000 mg by mouth at bedtime.   metFORMIN (GLUCOPHAGE) 500 MG tablet Take 1 tablet (500 mg total) by mouth 2 (two) times daily with a meal.   metoprolol tartrate (LOPRESSOR) 25 MG tablet Take 1 tablet (25 mg total) by mouth 2 (two) times daily.   potassium chloride SA (KLOR-CON M) 20 MEQ tablet Take 1 tablet (20 mEq total) by mouth daily.   pramipexole (MIRAPEX) 1 MG tablet Take 1 tablet (1 mg total) by mouth in the morning and at bedtime.   vitamin B-12 (CYANOCOBALAMIN) 1000 MCG tablet Take 1 tablet (1,000 mcg total) by mouth daily.   furosemide (LASIX) 40 MG tablet Take 1 tablet (40 mg total) by mouth daily. (Patient not taking: Reported on 06/02/2022)   hydrOXYzine (VISTARIL) 25 MG capsule Take 1 capsule (25 mg total) by mouth every  8 (eight) hours as needed. (Patient not taking: Reported on 06/02/2022)   mirtazapine (REMERON) 15 MG tablet Take 1 tablet (15 mg total) by mouth at bedtime. (Patient not taking: Reported on 06/02/2022)   No facility-administered encounter medications on file as of 07/23/2022.    Allergies (verified) Food, Nutmeg oil (myristica oil), Other, and Amlodipine   History: Past Medical History:  Diagnosis Date   Anxiety    Breast cancer ([place]) 06/30/11   inv ductal, ER/PR +, her-2 -   CAD (coronary artery disease)    NSTEMI 4/08: OM3 occluded (PCI unsuccessful), dRCA 95% => BMS, inf HK, EF 50%;   b. MV 11/12:  [place] ischemia  => c.  [place] 11/12:  dLAD 70%, OM1 70-80%, then occluded (no change from 2008), dOM filled L->L collats, mRCA stent occluded, dRCA filled L->R collats, EF 55-65% => med Rx (consider PCI of RCA if refractory angina)   Carotid stenosis    dopplers 5/08: 0-30% bilateral; 10/12: 0-39% B/L ICA => f/u 04/2013   Chronic kidney disease    COPD (chronic obstructive pulmonary disease) (HCC)    Depression    Depression with anxiety    GERD (gastroesophageal reflux disease)    OTC acid reducer prn   Headache(784.0)    sinus; occ. migraines   Hx of radiation therapy 08/24/11 to 10/07/11   L breast   Hyperlipidemia    Hypertension    under control; has been on med. x 4 yrs.   Myocardial infarction ([place])    Stable angina    a. med Rx after cath 05/2011   Stress incontinence, female    Past Surgical History:  Procedure Laterality Date   AXILLARY LYMPH NODE DISSECTION  07/31/2011   Procedure: AXILLARY LYMPH NODE DISSECTION;  Surgeon: [name], MD;  Location: [facility];  Service: General;  Laterality: Left;  left axillary sentinal node biopsy   BREAST LUMPECTOMY Left 06/30/2011   BREAST SURGERY     CARDIAC CATHETERIZATION  11/02/2006; 06/03/2011   CATARACT EXTRACTION Bilateral    CESAREAN SECTION  1987   CORONARY STENT PLACEMENT  11/02/2006   ELBOW SURGERY     right   LEFT HEART CATH AND CORONARY ANGIOGRAPHY N/A 10/07/2020   Procedure: LEFT HEART CATH AND CORONARY ANGIOGRAPHY;  Surgeon: [name], MD;  Location: [facility];  Service: Cardiovascular;  Laterality: N/A;   Family History  Problem Relation Age of Onset   Heart disease Mother    Heart disease Father    Cancer Maternal Aunt        pt unaware of what kind   Cancer Maternal Grandmother        ovarian   Heart attack Sister    Thyroid disease Daughter    Anxiety disorder Daughter    Depression Daughter    Bipolar disorder Sister    Social History   Socioeconomic History   Marital status: Single     Spouse name: Not on file   Number of children: 1   Years of education: 12+   Highest education level: Some college, no degree  Occupational History   Occupation: retired  Tobacco Use   Smoking status: Former    Packs/day: 1.00    Years: 45.00    Total pack years: 45.00    Types: Cigarettes    Quit date: 11/13/2021    Years since quitting: 0.6   Smokeless tobacco: Never   Tobacco comments:    on and off  Vaping  Use   Vaping Use: Never used  Substance and Sexual Activity   Alcohol use: Yes    Alcohol/week: 1.0 standard drink of alcohol    Types: 1 Glasses of wine per week    Comment: occasionally   Drug use: No   Sexual activity: Never    Birth control/protection: Post-menopausal  Other Topics Concern   Not on file  Social History Narrative   Lives at home alone. She has a daughter and grandchildren that she interacts with often.    Social Determinants of Health   Financial Resource Strain: High Risk (07/23/2022)   Overall Financial Resource Strain (CARDIA)    Difficulty of Paying Living Expenses: Hard  Food Insecurity: Food Insecurity Present (07/23/2022)   Hunger Vital Sign    Worried About Running Out of Food in the Last Year: Sometimes true    Ran Out of Food in the Last Year: Sometimes true  Transportation Needs: No Transportation Needs (07/23/2022)   PRAPARE - Hydrologist (Medical): No    Lack of Transportation (Non-Medical): No  Physical Activity: Insufficiently Active (07/23/2022)   Exercise Vital Sign    Days of Exercise per Week: 2 days    Minutes of Exercise per Session: 10 min  Stress: Stress Concern Present (07/23/2022)   [place]    Feeling of Stress : To some extent  Social Connections: Socially Isolated (07/23/2022)   Social Connection and Isolation Panel [NHANES]    Frequency of Communication with Friends and Family: More than three times a week     Frequency of Social Gatherings with Friends and Family: Once a week    Attends Religious Services: Never    Marine scientist or Organizations: No    Attends Music therapist: Never    Marital Status: Never married    Tobacco Counseling Counseling given: No Tobacco comments: on and off   Clinical Intake:  Pre-visit preparation completed: Yes  Pain : No/denies pain     Nutritional Status: BMI > 30  Obese Nutritional Risks: None Diabetes: Yes CBG done?: No Did pt. bring in CBG monitor from home?: No  How often do you need to have someone help you when you read instructions, pamphlets, or other written materials from your doctor or pharmacy?: 1 - Never What is the last grade level you completed in school?: some college  Diabetic? Nutrition Risk Assessment:  Has the patient had any N/V/D within the last 2 months?  No  Does the patient have any non-healing wounds?  No  Has the patient had any unintentional weight loss or weight gain?  No   Diabetes:  Is the patient diabetic?  Yes  If diabetic, was a CBG obtained today?  No  Did the patient bring in their glucometer from home?  No  How often do you monitor your CBG's? Patient has stopped checking her CBG's for the last few months.   Financial Strains and Diabetes Management:  Are you having any financial strains with the device, your supplies or your medication? No .  Does the patient want to be seen by Chronic Care Management for management of their diabetes?   Referral placed Would the patient like to be referred to a Nutritionist or for Diabetic Management?  No    Interpreter Needed?: No  Information entered by :: Baldomero Lamy, LPN   Activities of Daily Living    07/23/2022   12:13 PM  In your present state of health, do you have any difficulty performing the following activities:  Hearing? 0  Vision? 0  Difficulty concentrating or making decisions? 1  Comment all three  Walking or  climbing stairs? 1  Comment long distances due to copd and climbing stairs  Dressing or bathing? 0  Doing errands, shopping? 0  Preparing Food and eating ? N  Using the Toilet? N  In the past six months, have you accidently leaked urine? Y  Comment occasionally  Do you have problems with loss of bowel control? N  Managing your Medications? N  Managing your Finances? Y  Comment "never enough moneyGarment/textile technologist? Y  Comment physically and mentally    Patient Care Team: [name], FNP as PCP - General (Family Medicine) [name], MD as PCP - Cardiology (Cardiology) Nat Christen, MD as Attending Physician (Optometry) Shea Evans, [name], [name] as Social Worker (Licensed Clinical Social Worker) Johney Frame, Greer Ee, MD as Consulting Physician (Cardiology) Steffanie Rainwater, DPM as Consulting Physician (Podiatry)  Indicate any recent [place] you may have received from other than [facility] providers in the past year (date may be approximate).     Assessment:   This is a routine wellness examination for Kalina.  Hearing/Vision screen No results found.  Dietary issues and exercise activities discussed: Current Exercise Habits: Home exercise routine, Type of exercise: Other - see comments (similar to a bike but can do sitting), Time (Minutes): 10, Frequency (Times/Week): 2, Weekly Exercise (Minutes/Week): 20, Intensity: Mild, Exercise limited by: respiratory conditions(s) (copd)   Goals Addressed             This Visit's Progress    Patient Stated       07/23/2022 AWV Goal: Keep All Scheduled Appointments  Over the next year, patient will attend all scheduled appointments with their PCP and any specialists that they see.       Depression Screen    07/23/2022   12:11 PM 04/13/2022    2:15 PM 02/10/2022    3:50 PM 01/08/2022    2:45 PM 12/19/2021   10:54 AM 10/09/2021    1:10 PM 04/17/2021    2:28 PM  PHQ 2/9 Scores   PHQ - 2 Score _0 PHQ- 9 Score _1 Fall Risk    07/23/2022   12:11 PM 04/13/2022    2:14 PM 01/08/2022    2:47 PM 12/19/2021   10:54 AM 10/09/2021    1:10 PM  Fall Risk   Falls in the past year? 0 0 0 0 0    FALL RISK PREVENTION PERTAINING TO THE HOME:  Any stairs in or around the home? Yes  If so, are there any without handrails? No  Home free of loose throw rugs in walkways, pet beds, electrical cords, etc? Yes  Adequate lighting in your home to reduce risk of falls? Yes   ASSISTIVE DEVICES UTILIZED TO PREVENT FALLS:  Life alert? No  Use of a cane, walker or w/c? No  Grab bars in the bathroom? No  Shower chair or bench in shower? Yes  Elevated toilet seat or a handicapped toilet? Yes   TIMED UP AND GO:  Was the test performed? No .   Cognitive Function:    02/11/2018    3:24 PM 01/29/2017    4:26 PM 04/18/2015    4:28 PM  MMSE -  Mini Mental State Exam  Orientation to time _0 Orientation to Place _1 Registration _2 Attention/ Calculation _3 Recall _4 Language- name 2 objects _5 Language- repeat _6 Language- follow 3 step command _7 Language- read & follow direction _8 Write a sentence _9 Copy design _10 Total score _11 07/23/2022   12:17 PM 12/11/2020   10:08 AM 12/11/2019    9:56 AM  6CIT Screen  What Year? 0 points 0 points 0 points  What month? 0 points 0 points 0 points  What time? 0 points 0 points 0 points  Count back from 20 0 points 0 points 0 points  Months in reverse 0 points 0 points 0 points  Repeat phrase 0 points 2 points 0 points  Total Score 0 points 2 points 0 points    Immunizations Immunization History  Administered Date(s) Administered   Tdap 05/17/2001    Screening Tests Health Maintenance  Topic Date Due   PAP SMEAR-Modifier  05/24/2004   DTaP/Tdap/Td (2 - Td or Tdap) 05/18/2011   Diabetic kidney evaluation - Urine ACR  03/26/2021    OPHTHALMOLOGY EXAM  05/06/2021   FOOT EXAM  04/17/2022   LIPID PANEL  07/13/2022   COVID-19 Vaccine (1) 08/08/2022 (Originally 08/22/1960)   Pneumonia Vaccine 27+ Years old (1 - PCV) 10/10/2022 (Originally 08/22/1961)   Zoster Vaccines- Shingrix (1 of 2) 10/22/2022 (Originally 08/23/1975)   INFLUENZA VACCINE  10/25/2022 (Originally 02/24/2022)   DEXA SCAN  04/14/2023 (Originally 08/22/2021)   HEMOGLOBIN A1C  10/12/2022   Diabetic kidney evaluation - eGFR measurement  04/14/2023   Lung Cancer Screening  05/02/2023   Medicare Annual Wellness (AWV)  07/24/2023   MAMMOGRAM  05/04/2024   Fecal DNA (Cologuard)  04/28/2025   Hepatitis C Screening  Completed   HIV Screening  Completed   HPV VACCINES  Aged Out   COLONOSCOPY (Pts 45-5yr Insurance coverage will need to be confirmed)  Discontinued    Health Maintenance  Health Maintenance Due  Topic Date Due   PAP SMEAR-Modifier  05/24/2004   DTaP/Tdap/Td (2 - Td or Tdap) 05/18/2011   Diabetic kidney evaluation - Urine ACR  03/26/2021   OPHTHALMOLOGY EXAM  05/06/2021   FOOT EXAM  04/17/2022   LIPID PANEL  07/13/2022   Community Resource Referral / Chronic Care Management: CRR required this visit?  Yes   CCM required this visit?  Yes      Plan:     I have personally reviewed and noted the following in the patient's chart:   Medical and social history Use of alcohol, tobacco or illicit drugs  Current medications and supplements including opioid prescriptions. Patient is not currently taking opioid prescriptions. Functional ability and status Nutritional status Physical activity Advanced directives List of other physicians Hospitalizations, surgeries, and ER visits in previous 12 months Vitals Screenings to include cognitive, depression, and falls Referrals and appointments  In addition, I have reviewed and discussed with patient certain preventive protocols, quality metrics, and best practice recommendations. A written  personalized care plan for preventive services as well as general preventive health recommendations were provided to patient.     BWillette Pa LPN   [id number]  Nurse Notes: AVS printed and mailed to patient

## 2022-07-24 ENCOUNTER — Telehealth: Payer: Self-pay | Admitting: *Deleted

## 2022-07-24 DIAGNOSIS — J441 Chronic obstructive pulmonary disease with (acute) exacerbation: Secondary | ICD-10-CM | POA: Diagnosis not present

## 2022-07-24 DIAGNOSIS — I10 Essential (primary) hypertension: Secondary | ICD-10-CM | POA: Diagnosis not present

## 2022-07-24 NOTE — Telephone Encounter (Signed)
Patient calling office this morning reporting a blood pressure reading of 193/101.  Reviewed patient chart with DOD and advised patient to go to Urgent Care as we do not have any openings today.   Patient agreeable and will go to ER if unable to be seen at Urgent Care.

## 2022-08-03 ENCOUNTER — Ambulatory Visit (INDEPENDENT_AMBULATORY_CARE_PROVIDER_SITE_OTHER): Payer: Medicare HMO | Admitting: Internal Medicine

## 2022-08-03 ENCOUNTER — Ambulatory Visit (INDEPENDENT_AMBULATORY_CARE_PROVIDER_SITE_OTHER): Payer: Medicare HMO | Admitting: Nurse Practitioner

## 2022-08-03 ENCOUNTER — Ambulatory Visit: Payer: Medicare HMO | Admitting: Internal Medicine

## 2022-08-03 ENCOUNTER — Other Ambulatory Visit: Payer: Self-pay | Admitting: Nurse Practitioner

## 2022-08-03 ENCOUNTER — Other Ambulatory Visit: Payer: Self-pay | Admitting: Internal Medicine

## 2022-08-03 ENCOUNTER — Encounter: Payer: Self-pay | Admitting: Internal Medicine

## 2022-08-03 ENCOUNTER — Encounter: Payer: Self-pay | Admitting: Nurse Practitioner

## 2022-08-03 VITALS — BP 137/69 | HR 70 | Temp 97.9°F | Resp 20 | Ht 65.0 in | Wt 222.0 lb

## 2022-08-03 DIAGNOSIS — Z86010 Personal history of colonic polyps: Secondary | ICD-10-CM

## 2022-08-03 DIAGNOSIS — R195 Other fecal abnormalities: Secondary | ICD-10-CM

## 2022-08-03 DIAGNOSIS — E1140 Type 2 diabetes mellitus with diabetic neuropathy, unspecified: Secondary | ICD-10-CM

## 2022-08-03 DIAGNOSIS — Z024 Encounter for examination for driving license: Secondary | ICD-10-CM

## 2022-08-03 MED ORDER — SUTAB 1479-225-188 MG PO TABS: ORAL_TABLET | ORAL | 0 refills | Status: DC

## 2022-08-03 MED ORDER — GABAPENTIN 100 MG PO CAPS: 100.0000 mg | ORAL_CAPSULE | Freq: Every day | ORAL | 1 refills | Status: DC

## 2022-08-03 NOTE — Progress Notes (Signed)
   Subjective:    Patient ID: [name], female    DOB: 09/25/1955, 67 y.o.   MRN: [id number]   Chief Complaint: bilateral feet burning and Fingernails peeling   HPI Patient c/o burning in bil feet. No numbness. Is worse at night. She is a diabetic.    Review of Systems  Constitutional:  Negative for diaphoresis.  Eyes:  Negative for pain.  Respiratory:  Negative for shortness of breath.   Cardiovascular:  Negative for chest pain, palpitations and leg swelling.  Gastrointestinal:  Negative for abdominal pain.  Endocrine: Negative for polydipsia.  Skin:  Negative for rash.  Neurological:  Negative for dizziness, weakness and headaches.  Hematological:  Does not bruise/bleed easily.  All other systems reviewed and are negative.      Objective:   Physical Exam Vitals reviewed.  Constitutional:      Appearance: Normal appearance.  Cardiovascular:     Rate and Rhythm: Normal rate and regular rhythm.     Pulses: Normal pulses.     Heart sounds: Normal heart sounds.  Pulmonary:     Breath sounds: Normal breath sounds.  Skin:    General: Skin is warm.     Coloration: Skin is not jaundiced.     Comments: (+) monofilament bil feet  Neurological:     General: No focal deficit present.     Mental Status: She is alert and oriented to person, place, and time.  Psychiatric:        Mood and Affect: Mood normal.        Behavior: Behavior normal.       BP 137/69   Pulse 70   Temp 97.9 F (36.6 C) (Temporal)   Resp 20   Ht '5\' 5"'$  (1.651 m)   Wt 222 lb (100.7 kg)   LMP 10/17/2005   SpO2 94%   BMI 36.94 kg/m      Assessment & Plan:   [name] in today with chief complaint of bilateral feet burning and Fingernails peeling   1. Type 2 diabetes mellitus with diabetic neuropathy, without long-term current use of insulin (HCC) Keep feet warm Check feet daily Meds ordered this encounter  Medications   gabapentin (NEURONTIN) 100 MG capsule    Sig: Take 1  capsule (100 mg total) by mouth at bedtime.    Dispense:  90 capsule    Refill:  1    Order Specific Question:   Supervising Provider    Answer:   Caryl Pina A [[id number]]     2. Encounter for driver's license history and physical DMV forms filled out    The above assessment and management plan was discussed with the patient. The patient verbalized understanding of and has agreed to the management plan. Patient is aware to call the clinic if symptoms persist or worsen. Patient is aware when to return to the clinic for a follow-up visit. Patient educated on when it is appropriate to go to the emergency department.   Tracy Hassell Done, FNP

## 2022-08-03 NOTE — Patient Instructions (Signed)
_______________________________________________________  If you are age 66 or older, your body mass index should be between 23-30. Your Body mass index is 36.78 kg/m. If this is out of the aforementioned range listed, please consider follow up with your Primary Care Provider. _______________________________________________________  The [facility] providers would like to encourage you to use [facility] to communicate with providers for non-urgent requests or questions.  Due to long hold times on the telephone, sending your provider a message by [facility] may be a faster and more efficient way to get a response.  Please allow 48 business hours for a response.  Please remember that this is for non-urgent requests.  _______________________________________________________  [name] have been scheduled for a colonoscopy. Please follow written instructions given to you at your visit today.  Please pick up your prep supplies at the pharmacy within the next 1-3 days. If you use inhalers (even only as needed), please bring them with you on the day of your procedure.  Due to recent changes in healthcare laws, you may see the results of your imaging and laboratory studies on MyChart before your provider has had a chance to review them.  We understand that in some cases there may be results that are confusing or concerning to you. Not all laboratory results come back in the same time frame and the provider may be waiting for multiple results in order to interpret others.  Please give [place] 48 hours in order for your provider to thoroughly review all the results before contacting the office for clarification of your results.   Thank you for entrusting me with your care and choosing [facility].  Dr Lorenso Courier

## 2022-08-03 NOTE — Progress Notes (Signed)
Chief Complaint: Positive Cologuard  HPI : 67 year old female with history of CAD, breast cancer, carotid artery stenosis, CKD, COPD, GERD, headache presents with positive Cologuard  She has not seen any hematochezia or melena. Denies changes in bowel habits or unintentional weight loss. Denies diarrhea or constipation. Denies N&V, dysphagia, or abdominal pain. She has some chest burning but this is not severe. Denies family history of GI cancers. Denies use of blood thinners. Her last colonoscopy was in 2003 that showed some a precancerous polyp.   Wt Readings from Last 3 Encounters:  08/03/22 221 lb (100.2 kg)  07/23/22 210 lb (95.3 kg)  06/02/22 219 lb (99.3 kg)   Past Medical History:  Diagnosis Date   Anxiety    Breast cancer ([place]) 06/30/11   inv ductal, ER/PR +, her-2 -   CAD (coronary artery disease)    NSTEMI 4/08: OM3 occluded (PCI unsuccessful), dRCA 95% => BMS, inf HK, EF 50%;   b. MV 11/12:  [place] ischemia => c.  [place] 11/12:  dLAD 70%, OM1 70-80%, then occluded (no change from 2008), dOM filled L->L collats, mRCA stent occluded, dRCA filled L->R collats, EF 55-65% => med Rx (consider PCI of RCA if refractory angina)   Carotid stenosis    dopplers 5/08: 0-30% bilateral; 10/12: 0-39% B/L ICA => f/u 04/2013   Chronic kidney disease    COPD (chronic obstructive pulmonary disease) (HCC)    Depression    Depression with anxiety    GERD (gastroesophageal reflux disease)    OTC acid reducer prn   Headache(784.0)    sinus; occ. migraines   Hx of radiation therapy 08/24/11 to 10/07/11   L breast   Hyperlipidemia    Hypertension    under control; has been on med. x 4 yrs.   Myocardial infarction ([place])    Stable angina    a. med Rx after cath 05/2011   Stress incontinence, female      Past Surgical History:  Procedure Laterality Date   AXILLARY LYMPH NODE DISSECTION  07/31/2011   Procedure: AXILLARY LYMPH NODE DISSECTION;  Surgeon: [name], MD;  Location: [facility];  Service: General;  Laterality: Left;  left axillary sentinal node biopsy   BREAST LUMPECTOMY Left 06/30/2011   BREAST SURGERY     CARDIAC CATHETERIZATION  11/02/2006; 06/03/2011   CATARACT EXTRACTION Bilateral    CESAREAN SECTION  1987   CORONARY STENT PLACEMENT  11/02/2006   ELBOW SURGERY     right   LEFT HEART CATH AND CORONARY ANGIOGRAPHY N/A 10/07/2020   Procedure: LEFT HEART CATH AND CORONARY ANGIOGRAPHY;  Surgeon: [name], MD;  Location: [facility];  Service: Cardiovascular;  Laterality: N/A;   Family History  Problem Relation Age of Onset   Heart disease Mother    Heart disease Father    Cancer Maternal Aunt        pt unaware of what kind   Cancer Maternal Grandmother        ovarian   Heart attack Sister    Thyroid disease Daughter    Anxiety disorder Daughter    Depression Daughter    Bipolar disorder Sister    Social History   Tobacco Use   Smoking status: Former    Packs/day: 1.00    Years: 45.00    Total pack years: 45.00    Types: Cigarettes    Quit date: 11/13/2021    Years since quitting: 0.7   Smokeless  tobacco: Never   Tobacco comments:    on and off  Vaping Use   Vaping Use: Never used  Substance Use Topics   Alcohol use: Yes    Alcohol/week: 1.0 standard drink of alcohol    Types: 1 Glasses of wine per week    Comment: occasionally   Drug use: No   Current Outpatient Medications  Medication Sig Dispense Refill   albuterol (VENTOLIN HFA) 108 (90 Base) MCG/ACT inhaler Inhale 2 puffs into the lungs every 6 (six) hours as needed for wheezing or shortness of breath. 6.7 g 0   Alcohol Swabs (B-D SINGLE USE SWABS REGULAR) PADS check blood sugars twice daily Dx E11.9 200 each 0   aspirin EC 81 MG tablet Take 1 tablet (81 mg total) by mouth daily. Swallow whole. 30 tablet 0   atorvastatin (LIPITOR) 80 MG tablet Take 1 tablet (80 mg total) by mouth at bedtime. 90 tablet 1   Budeson-Glycopyrrol-Formoterol (BREZTRI AEROSPHERE)  160-9-4.8 MCG/ACT AERO INHALE 2 PUFFS INTO THE LUNGS IN THE MORNING AND AT BEDTIME. 32.1 g 0   Cholecalciferol (VITAMIN D3) 5000 units TABS Take 10,000 Units by mouth every evening.     cloNIDine (CATAPRES) 0.1 MG tablet Take 0.1 mg by mouth 2 (two) times daily.     escitalopram (LEXAPRO) 20 MG tablet Take 1 tablet (20 mg total) by mouth daily. 90 tablet 1   fish oil-omega-3 fatty acids 1000 MG capsule Take 1,000 mg by mouth at bedtime.     metFORMIN (GLUCOPHAGE) 500 MG tablet Take 1 tablet (500 mg total) by mouth 2 (two) times daily with a meal. 180 tablet 1   metoprolol tartrate (LOPRESSOR) 25 MG tablet Take 1 tablet (25 mg total) by mouth 2 (two) times daily. 180 tablet 1   potassium chloride SA (KLOR-CON M) 20 MEQ tablet Take 1 tablet (20 mEq total) by mouth daily. 90 tablet 1   pramipexole (MIRAPEX) 1 MG tablet Take 1 tablet (1 mg total) by mouth in the morning and at bedtime. 180 tablet 1   vitamin B-12 (CYANOCOBALAMIN) 1000 MCG tablet Take 1 tablet (1,000 mcg total) by mouth daily. 30 tablet 0   furosemide (LASIX) 40 MG tablet Take 1 tablet (40 mg total) by mouth daily. (Patient not taking: Reported on 08/03/2022) 90 tablet 1   hydrOXYzine (VISTARIL) 25 MG capsule Take 1 capsule (25 mg total) by mouth every 8 (eight) hours as needed. (Patient not taking: Reported on 08/03/2022) 30 capsule 0   mirtazapine (REMERON) 15 MG tablet Take 1 tablet (15 mg total) by mouth at bedtime. (Patient not taking: Reported on 08/03/2022) 30 tablet 2   No current facility-administered medications for this visit.   Allergies  Allergen Reactions   Food Other (See Comments)    Nutmeg= difficulty breathing   Nutmeg Oil (Myristica Oil) Hives   Other Other (See Comments)    Nutmeg= difficulty breathing   Amlodipine Swelling     Review of Systems: All systems reviewed and negative except where noted in HPI.   Physical Exam: BP 110/80   Pulse 70   Ht '5\' 5"'$  (1.651 m)   Wt 221 lb (100.2 kg)   LMP 10/17/2005    BMI 36.78 kg/m  Constitutional: Pleasant,well-developed, female in no acute distress. HEENT: Normocephalic and atraumatic. Conjunctivae are normal. No scleral icterus. Cardiovascular: Normal rate, regular rhythm.  Pulmonary/chest: Effort normal and breath sounds normal. No wheezing, rales or rhonchi. Abdominal: Soft, nondistended, nontender. Bowel sounds active throughout. There are no  masses palpable. No hepatomegaly. Extremities: No edema Neurological: Alert and oriented to person place and time. Skin: Skin is warm and dry. No rashes noted. Psychiatric: Normal mood and affect. Behavior is normal.  Labs 03/2022: CBC with elevated WBC of 11.9. CMP with elevated Cr of 1.07. HbA1C is elevated at 6.5%.   Labs 04/2022: Positive Cologuard  CT C/A/P w/o contrast 11/16/21: IMPRESSION: 1. Extensive left-sided pneumonia. 2. No acute intra-abdominal finding. 3.  Body wall edema and trace ascites suggesting volume overload. 4. Thyroid nodules. Recommend thyroid US after convalescence.(Ref: J Am Coll Radiol. 2015 Feb;12(2): 143-50).  Colonoscopy 01/31/02: Two sigmoid polyps s/p polyp resection. Grade 1 hemorrhoids. Path: 2. COLON, POLYP AT 15 CM: TUBULAR ADENOMA AND A HYPERPLASTIC POLYP. NO HIGH GRADE DYSPLASIA OR MALIGNANCY IDENTIFIED.   ASSESSMENT AND PLAN: Positive Cologuard History of colon polyps Patient presents with a positive Cologuard in 04/2022. Her last colonoscopy was in 2003 with a precancerous polyp and hemorrhoids at that time. Will plan for a colonoscopy for further evaluation.  - Colonoscopy [facility]. Plan for Sutab prep.   Christia Reading, MD  I spent 46 minutes of time, including in depth chart review, independent review of results as outlined above, communicating results with the patient directly, face-to-face time with the patient, coordinating care, ordering studies and medications as appropriate, and documentation.

## 2022-08-03 NOTE — Progress Notes (Signed)
Duplicate. See previous note.

## 2022-08-04 NOTE — Telephone Encounter (Signed)
Returned call to the patient who stated she does not want to drink any of the colonoscopy preps.  She stated she is willing to pay out of pocket for Sutab.  Coupon was faxed to [facility] in [place].  Confirmation fax was received.   Phone call to patient who stated the pharmacy had contacted her and made her aware that coupon is valid and she would be able to use the coupon.  Patient stated she will pick up Sutab at pharmacy.  Patient agreed to plan and verbalized understanding.  No further questions.

## 2022-08-12 ENCOUNTER — Telehealth: Payer: Self-pay | Admitting: *Deleted

## 2022-08-12 NOTE — Progress Notes (Signed)
  Care Coordination   Note   08/12/2022 Name: [name] MRN: [id number] DOB: March 20, 1956  [name] is a 67 y.o. year old female who sees [name], FNP for primary care. I reached out to [name] by phone today to offer care coordination services.  [name] was given information about Care Coordination services today including:   The Care Coordination services include support from the care team which includes your Nurse Coordinator, Clinical Social Worker, or Pharmacist.  The Care Coordination team is here to help remove barriers to the health concerns and goals most important to you. Care Coordination services are voluntary, and the patient may decline or stop services at any time by request to their care team member.   Care Coordination Consent Status: Patient agreed to services and verbal consent obtained.   Follow up plan:  Telephone appointment with care coordination team member scheduled for:  08/18/22  Encounter Outcome:  Pt. Scheduled  [place]  Direct Dial: [phone]

## 2022-08-13 ENCOUNTER — Ambulatory Visit: Payer: Medicare HMO | Admitting: *Deleted

## 2022-08-13 DIAGNOSIS — J449 Chronic obstructive pulmonary disease, unspecified: Secondary | ICD-10-CM

## 2022-08-13 DIAGNOSIS — E1122 Type 2 diabetes mellitus with diabetic chronic kidney disease: Secondary | ICD-10-CM

## 2022-08-13 NOTE — Plan of Care (Signed)
Chronic Care Management Provider Comprehensive Care Plan    08/13/2022 Name: [name] MRN: [id number] DOB: [birth year]  Referral to Chronic Care Management (CCM) services was placed by Provider:  [name] FNP on Date: 07/23/22.  Chronic Condition 1: COPD Provider Assessment and Plan Chronic obstructive pulmonary disease, unspecified COPD type ([place]) Keep follow up appointment with pulmonology   Expected Outcome/Goals Addressed This Visit (Provider CCM goals/Provider Assessment and plan  CCM (COPD) EXPECTED OUTCOME: MONITOR, SELF-MANAGE AND REDUCE SYMTPOM OF COPD  Symptom Management Condition 1: Take medications as prescribed   Attend all scheduled provider appointments Call pharmacy for medication refills 3-7 days in advance of running out of medications Attend church or other social activities Perform all self care activities independently  Perform IADL's (shopping, preparing meals, housekeeping, managing finances) independently Call provider office for new concerns or questions  identify and remove indoor air pollutants limit outdoor activity during cold weather listen for public air quality announcements every day do breathing exercises every day develop a rescue plan follow rescue plan if symptoms flare-up get at least 7 to 8 hours of sleep at night practice relaxation or meditation daily Look over education sent via my chart- COPD action plan Care guide will be contacting you about resources for food, repair issues at your home  Chronic Condition 2: DIABETES  Provider Assessment and Plan Type 2 diabetes mellitus with stage 2 chronic kidney disease, without long-term current use of insulin ([place]) Continue to watch carbs in deit - Bayer DCA Hb A1c Waived - metFORMIN (GLUCOPHAGE) 500 MG tablet; Take 1 tablet (500 mg total) by mouth 2 (two) times daily with a meal.  Dispense: 180 tablet; Refill: 1    Expected Outcome/Goals Addressed This Visit (Provider CCM  goals/Provider Assessment and plan  CCM (DIABETES) EXPECTED OUTCOME:  MONITOR, SELF-MANAGE AND REDUCE SYMPTOMS OF DIABETES  Symptom Management Condition 2: Take medications as prescribed   Attend all scheduled provider appointments Call pharmacy for medication refills 3-7 days in advance of running out of medications Attend church or other social activities Perform all self care activities independently  Perform IADL's (shopping, preparing meals, housekeeping, managing finances) independently Call provider office for new concerns or questions  check blood sugar at prescribed times: once daily check feet daily for cuts, sores or redness enter blood sugar readings and medication or insulin into daily log take the blood sugar log to all doctor visits take the blood sugar meter to all doctor visits trim toenails straight across fill half of plate with vegetables limit fast food meals to no more than 1 per week manage portion size read food labels for fat, fiber, carbohydrates and portion size keep feet up while sitting Look over education sent via my chart- hypoglycemia Social worker will call you on 08/18/22  Problem List Patient Active Problem List   Diagnosis Date Noted   Thrush 06/03/2022   Chronic respiratory failure with hypoxia ([place]) 06/02/2022   Thyroid nodule incidentally noted on imaging study 12/01/2021   Acute renal failure superimposed on stage 3a chronic kidney disease ([place]) 11/29/2021   Depression 04/08/2016   Morbid obesity ([place]) 12/20/2015   DM (diabetes mellitus), type 2 with renal complications ([place]) 22/97/9892   Restless leg syndrome 03/12/2015   Vitamin D deficiency 03/12/2015   Generalized anxiety disorder 02/14/2014   Malignant neoplasm of upper-inner quadrant of female breast ([place]) 07/30/2011   History of myocardial infarction    Hypertension    Hyperlipidemia    CAD S/P percutaneous coronary  angioplasty    COPD (chronic obstructive pulmonary disease)  (HCC)    Carotid stenosis     Medication Management  Current Outpatient Medications:    albuterol (VENTOLIN HFA) 108 (90 Base) MCG/ACT inhaler, Inhale 2 puffs into the lungs every 6 (six) hours as needed for wheezing or shortness of breath., Disp: 6.7 g, Rfl: 0   Alcohol Swabs (B-D SINGLE USE SWABS REGULAR) PADS, check blood sugars twice daily Dx E11.9, Disp: 200 each, Rfl: 0   aspirin EC 81 MG tablet, Take 1 tablet (81 mg total) by mouth daily. Swallow whole., Disp: 30 tablet, Rfl: 0   atorvastatin (LIPITOR) 80 MG tablet, Take 1 tablet (80 mg total) by mouth at bedtime., Disp: 90 tablet, Rfl: 1   Budeson-Glycopyrrol-Formoterol (BREZTRI AEROSPHERE) 160-9-4.8 MCG/ACT AERO, INHALE 2 PUFFS INTO THE LUNGS IN THE MORNING AND AT BEDTIME., Disp: 32.1 g, Rfl: 0   Cholecalciferol (VITAMIN D3) 5000 units TABS, Take 10,000 Units by mouth every evening., Disp: , Rfl:    cloNIDine (CATAPRES) 0.1 MG tablet, Take 0.1 mg by mouth 2 (two) times daily., Disp: , Rfl:    escitalopram (LEXAPRO) 20 MG tablet, Take 1 tablet (20 mg total) by mouth daily., Disp: 90 tablet, Rfl: 1   fish oil-omega-3 fatty acids 1000 MG capsule, Take 1,000 mg by mouth at bedtime., Disp: , Rfl:    gabapentin (NEURONTIN) 100 MG capsule, Take 1 capsule (100 mg total) by mouth at bedtime., Disp: 90 capsule, Rfl: 1   metFORMIN (GLUCOPHAGE) 500 MG tablet, Take 1 tablet (500 mg total) by mouth 2 (two) times daily with a meal., Disp: 180 tablet, Rfl: 1   metoprolol tartrate (LOPRESSOR) 25 MG tablet, Take 1 tablet (25 mg total) by mouth 2 (two) times daily., Disp: 180 tablet, Rfl: 1   potassium chloride SA (KLOR-CON M) 20 MEQ tablet, Take 1 tablet (20 mEq total) by mouth daily., Disp: 90 tablet, Rfl: 1   pramipexole (MIRAPEX) 1 MG tablet, Take 1 tablet (1 mg total) by mouth in the morning and at bedtime., Disp: 180 tablet, Rfl: 1   vitamin B-12 (CYANOCOBALAMIN) 1000 MCG tablet, Take 1 tablet (1,000 mcg total) by mouth daily., Disp: 30 tablet, Rfl:  0   amoxicillin (AMOXIL) 875 MG tablet, Take by mouth. (Patient not taking: Reported on 08/13/2022), Disp: , Rfl:    furosemide (LASIX) 40 MG tablet, Take 1 tablet (40 mg total) by mouth daily. (Patient not taking: Reported on 08/03/2022), Disp: 90 tablet, Rfl: 1   mirtazapine (REMERON) 15 MG tablet, Take 1 tablet (15 mg total) by mouth at bedtime. (Patient not taking: Reported on 08/13/2022), Disp: 30 tablet, Rfl: 2   predniSONE (DELTASONE) 10 MG tablet, Take 4 po qd x 2d then 3 po qd x 2d then 2 po qd x 2d then 1 po qd x 2d then stop (Patient not taking: Reported on 08/13/2022), Disp: , Rfl:    Sodium Sulfate-Mag Sulfate-KCl (SUTAB) [phone] MG TABS, Use as directed (Patient not taking: Reported on 08/13/2022), Disp: 24 tablet, Rfl: 0  Cognitive Assessment Identity Confirmed: : Name; DOB Cognitive Status: Normal   Functional Assessment Hearing Difficulty or Deaf: no Wear Glasses or Blind: yes Vision Management: can see well w/ glasses Concentrating, Remembering or Making Decisions Difficulty (CP): no Difficulty Communicating: no Difficulty Eating/Swallowing: no Walking or Climbing Stairs Difficulty: yes Walking or Climbing Stairs: ambulation difficulty, requires equipment Mobility Management: uses hand railing Dressing/Bathing Difficulty: no Doing Errands Independently Difficulty (such as shopping) (CP): no   Caregiver  Assessment  Primary Source of Support/Comfort: child(ren) Name of Support/Comfort Primary Source: daughter -  [name] People in Home: alone   Planned Interventions  Provided patient with basic written and verbal COPD education on self care/management/and exacerbation prevention Advised patient to track and manage COPD triggers Provided instruction about proper use of medications used for management of COPD including inhalers Advised patient to self assesses COPD action plan zone and make appointment with provider if in the yellow zone for 48 hours without  improvement Advised patient to engage in light exercise as tolerated 3-5 days a week to aid in the the management of COPD Provided education about and advised patient to utilize infection prevention strategies to reduce risk of respiratory infection Discussed the importance of adequate rest and management of fatigue with COPD Referral made to community resources care guide team for assistance with financial constraints, food insecurity, mold, mice issues in the home, lives in older house that needs repairs Screening for signs and symptoms of depression related to chronic disease state  Assessed social determinant of health barriers Provided education to patient about basic DM disease process; Reviewed medications with patient and discussed importance of medication adherence;        Reviewed prescribed diet with patient low sodium; Counseled on importance of regular laboratory monitoring as prescribed;        Discussed plans with patient for ongoing care management follow up and provided patient with direct contact information for care management team;      Provided patient with written educational materials related to hypo and hyperglycemia and importance of correct treatment;       Advised patient, providing education and rationale, to check cbg once daily and record        call provider for findings outside established parameters;       Review of patient status, including review of consultants reports, relevant laboratory and other test results, and medications completed;       Screening for signs and symptoms of depression related to chronic disease state;        Assessed social determinant of health barriers;        Reviewed upcoming appointments including social work telephone call on 08/18/22 In basket sent to primary care provider reporting pt is not taking furosemide , continues taking potassium,  ask if pt is supposed to be taking potassium  Interaction and coordination with outside  resources, practitioners, and providers See CCM Referral  Care Plan: Available in MyChart

## 2022-08-13 NOTE — Chronic Care Management (AMB) (Signed)
Chronic Care Management   CCM RN Visit Note  08/13/2022 Name: [name] MRN: [id number] DOB: Aug 29, 1955  Subjective: [name] is a 67 y.o. year old female who is a primary care patient of [name], [place]. The patient was referred to the Chronic Care Management team for assistance with care management needs subsequent to provider initiation of CCM services and plan of care.    Today's Visit:  Engaged with patient by telephone for initial visit.     SDOH Interventions Today    Flowsheet Row Most Recent Value  SDOH Interventions   Food Insecurity Interventions Other (Comment)  Housing Interventions Other (Comment)  [referral to care guide]  Transportation Interventions Intervention Not Indicated  Utilities Interventions Intervention Not Indicated  Financial Strain Interventions Other (Comment)  [referral to care guide]  Physical Activity Interventions Patient Refused  Stress Interventions Intervention Not Indicated  Social Connections Interventions Patient Refused         Goals Addressed             This Visit's Progress    CCM (COPD) EXPECTED OUTCOME: MONITOR, SELF-MANAGE AND REDUCE SYMTPOM OF COPD       Current Barriers:  Knowledge Deficits related to COPD management Chronic Disease Management support and education needs related to COPD, action plan Financial Constraints.  Patient reports she lives alone, has adult daughter she can call on if needed, pt states she is independent with all aspects of her care, continues to drive. Patient reports she has financial issues, receives 23$/ month food stamps but still worries she might run out of food or have enough money to buy food, states she lives in an older house that is in need of lots of repairs, has mice and mold issues, states she sent back the oxygen concentrator because when plugged in made the lights flicker and afraid she would blow a fuse, had portable oxygen also but due to not having  concentrator she did not pay the bill for something she did not have and was not using, so portable oxygen was sent back also for not paying the bill (pt states she was required to have concentrator or was not allowed to supplement with the portable oxygen) Patient reports she has pulse oximeter and monitors oxygen saturation.  Planned Interventions: Provided patient with basic written and verbal COPD education on self care/management/and exacerbation prevention Advised patient to track and manage COPD triggers Provided instruction about proper use of medications used for management of COPD including inhalers Advised patient to self assesses COPD action plan zone and make appointment with provider if in the yellow zone for 48 hours without improvement Advised patient to engage in light exercise as tolerated 3-5 days a week to aid in the the management of COPD Provided education about and advised patient to utilize infection prevention strategies to reduce risk of respiratory infection Discussed the importance of adequate rest and management of fatigue with COPD Referral made to community resources care guide team for assistance with financial constraints, food insecurity, mold, mice issues in the home, lives in older house that needs repairs Screening for signs and symptoms of depression related to chronic disease state  Assessed social determinant of health barriers  Symptom Management: Take medications as prescribed   Attend all scheduled provider appointments Call pharmacy for medication refills 3-7 days in advance of running out of medications Attend church or other social activities Perform all self care activities independently  Perform IADL's (shopping, preparing meals, housekeeping, managing finances)  independently Call provider office for new concerns or questions  identify and remove indoor air pollutants limit outdoor activity during cold weather listen for public air quality  announcements every day do breathing exercises every day develop a rescue plan follow rescue plan if symptoms flare-up get at least 7 to 8 hours of sleep at night practice relaxation or meditation daily Look over education sent via my chart- COPD action plan Care guide will be contacting you about resources for food, repair issues at your home  Follow Up Plan: Telephone follow up appointment with care management team member scheduled for:  09/25/22 at 1045 am       CCM (DIABETES) EXPECTED OUTCOME:  MONITOR, SELF-MANAGE AND REDUCE SYMPTOMS OF DIABETES       Current Barriers:  Knowledge Deficits related to Diabetes management Chronic Disease Management support and education needs related to Diabetes, diet Financial Constraints.  Patient reports she checks CBG once daily with readings fasting around 120, random near 180 Patient does some exercise but does not have formal exercise routine Patient states she is not taking furosemide because feels like she does not need it, continues taking potassium and not sure if she is supposed to  Planned Interventions: Provided education to patient about basic DM disease process; Reviewed medications with patient and discussed importance of medication adherence;        Reviewed prescribed diet with patient low sodium; Counseled on importance of regular laboratory monitoring as prescribed;        Discussed plans with patient for ongoing care management follow up and provided patient with direct contact information for care management team;      Provided patient with written educational materials related to hypo and hyperglycemia and importance of correct treatment;       Advised patient, providing education and rationale, to check cbg once daily and record        call provider for findings outside established parameters;       Review of patient status, including review of consultants reports, relevant laboratory and other test results, and medications  completed;       Screening for signs and symptoms of depression related to chronic disease state;        Assessed social determinant of health barriers;        Reviewed upcoming appointments including social work telephone call on 08/18/22 In basket sent to primary care provider reporting pt is not taking furosemide , continues taking potassium,  ask if pt is supposed to be taking potassium  Symptom Management: Take medications as prescribed   Attend all scheduled provider appointments Call pharmacy for medication refills 3-7 days in advance of running out of medications Attend church or other social activities Perform all self care activities independently  Perform IADL's (shopping, preparing meals, housekeeping, managing finances) independently Call provider office for new concerns or questions  check blood sugar at prescribed times: once daily check feet daily for cuts, sores or redness enter blood sugar readings and medication or insulin into daily log take the blood sugar log to all doctor visits take the blood sugar meter to all doctor visits trim toenails straight across fill half of plate with vegetables limit fast food meals to no more than 1 per week manage portion size read food labels for fat, fiber, carbohydrates and portion size keep feet up while sitting Look over education sent via my chart- hypoglycemia Social worker will call you on 08/18/22  Follow Up Plan: Telephone follow up appointment with  care management team member scheduled for: 09/25/22 at 1045 am          Plan:Telephone follow up appointment with care management team member scheduled for:  09/25/22 AT 1045 am  [name] [facility], BSN RN Case Manager [place] [phone]

## 2022-08-13 NOTE — Patient Instructions (Addendum)
Please call the care guide team at [phone] if you need to cancel or reschedule your appointment.   If you are experiencing a Mental Health or [place] or need someone to talk to, please call the Suicide and Crisis Lifeline: 988 call the [place] National Suicide Prevention Lifeline: 586 588 3858 or TTY: 862-102-7189 TTY (332)645-5305) to talk to a trained counselor call 1-800-273-TALK (toll free, 24 hour hotline) go to [facility] [address] [place] [phone]) call the [facility]: [phone] call 911   Following is a copy of the CCM Program Consent:  CCM service includes personalized support from designated clinical staff supervised by the physician, including individualized plan of care and coordination with other care providers 24/7 contact phone numbers for assistance for urgent and routine care needs. Service will only be billed when office clinical staff spend 20 minutes or more in a month to coordinate care. Only one practitioner may furnish and bill the service in a calendar month. The patient may stop CCM services at amy time (effective at the end of the month) by phone call to the office staff. The patient will be responsible for cost sharing (co-pay) or up to 20% of the service fee (after annual deductible is met)  Following is a copy of your full provider care plan:   Goals Addressed             This Visit's Progress    CCM (COPD) EXPECTED OUTCOME: MONITOR, SELF-MANAGE AND REDUCE SYMTPOM OF COPD       Current Barriers:  Knowledge Deficits related to COPD management Chronic Disease Management support and education needs related to COPD, action plan Financial Constraints.  Patient reports she lives alone, has adult daughter she can call on if needed, pt states she is independent with all aspects of her care, continues to drive. Patient reports she has financial issues, receives 23$/ month  food stamps but still worries she might run out of food or have enough money to buy food, states she lives in an older house that is in need of lots of repairs, has mice and mold issues, states she sent back the oxygen concentrator because when plugged in made the lights flicker and afraid she would blow a fuse, had portable oxygen also but due to not having concentrator she did not pay the bill for something she did not have and was not using, so portable oxygen was sent back also for not paying the bill (pt states she was required to have concentrator or was not allowed to supplement with the portable oxygen) Patient reports she has pulse oximeter and monitors oxygen saturation.  Planned Interventions: Provided patient with basic written and verbal COPD education on self care/management/and exacerbation prevention Advised patient to track and manage COPD triggers Provided instruction about proper use of medications used for management of COPD including inhalers Advised patient to self assesses COPD action plan zone and make appointment with provider if in the yellow zone for 48 hours without improvement Advised patient to engage in light exercise as tolerated 3-5 days a week to aid in the the management of COPD Provided education about and advised patient to utilize infection prevention strategies to reduce risk of respiratory infection Discussed the importance of adequate rest and management of fatigue with COPD Referral made to community resources care guide team for assistance with financial constraints, food insecurity, mold, mice issues in the home, lives in older house that needs repairs Screening for signs  and symptoms of depression related to chronic disease state  Assessed social determinant of health barriers  Symptom Management: Take medications as prescribed   Attend all scheduled provider appointments Call pharmacy for medication refills 3-7 days in advance of running out of  medications Attend church or other social activities Perform all self care activities independently  Perform IADL's (shopping, preparing meals, housekeeping, managing finances) independently Call provider office for new concerns or questions  identify and remove indoor air pollutants limit outdoor activity during cold weather listen for public air quality announcements every day do breathing exercises every day develop a rescue plan follow rescue plan if symptoms flare-up get at least 7 to 8 hours of sleep at night practice relaxation or meditation daily Look over education sent via my chart- COPD action plan Care guide will be contacting you about resources for food, repair issues at your home  Follow Up Plan: Telephone follow up appointment with care management team member scheduled for:  09/25/22 at 1045 am       CCM (DIABETES) EXPECTED OUTCOME:  MONITOR, SELF-MANAGE AND REDUCE SYMPTOMS OF DIABETES       Current Barriers:  Knowledge Deficits related to Diabetes management Chronic Disease Management support and education needs related to Diabetes, diet Financial Constraints.  Patient reports she checks CBG once daily with readings fasting around 120, random near 180 Patient does some exercise but does not have formal exercise routine Patient states she is not taking furosemide because feels like she does not need it, continues taking potassium and not sure if she is supposed to  Planned Interventions: Provided education to patient about basic DM disease process; Reviewed medications with patient and discussed importance of medication adherence;        Reviewed prescribed diet with patient low sodium; Counseled on importance of regular laboratory monitoring as prescribed;        Discussed plans with patient for ongoing care management follow up and provided patient with direct contact information for care management team;      Provided patient with written educational materials  related to hypo and hyperglycemia and importance of correct treatment;       Advised patient, providing education and rationale, to check cbg once daily and record        call provider for findings outside established parameters;       Review of patient status, including review of consultants reports, relevant laboratory and other test results, and medications completed;       Screening for signs and symptoms of depression related to chronic disease state;        Assessed social determinant of health barriers;        Reviewed upcoming appointments including social work telephone call on 08/18/22 In basket sent to primary care provider reporting pt is not taking furosemide , continues taking potassium,  ask if pt is supposed to be taking potassium  Symptom Management: Take medications as prescribed   Attend all scheduled provider appointments Call pharmacy for medication refills 3-7 days in advance of running out of medications Attend church or other social activities Perform all self care activities independently  Perform IADL's (shopping, preparing meals, housekeeping, managing finances) independently Call provider office for new concerns or questions  check blood sugar at prescribed times: once daily check feet daily for cuts, sores or redness enter blood sugar readings and medication or insulin into daily log take the blood sugar log to all doctor visits take the blood sugar meter to all  doctor visits trim toenails straight across fill half of plate with vegetables limit fast food meals to no more than 1 per week manage portion size read food labels for fat, fiber, carbohydrates and portion size keep feet up while sitting Look over education sent via my chart- hypoglycemia Social worker will call you on 08/18/22  Follow Up Plan: Telephone follow up appointment with care management team member scheduled for: 09/25/22 at 1045 am          Patient verbalizes understanding of  instructions and care plan provided today and agrees to view in [place]. Active MyChart status and patient understanding of how to access instructions and care plan via MyChart confirmed with patient.     Telephone follow up appointment with care management team member scheduled for:  09/25/22 at 25 am  COPD Action Plan A COPD action plan is a description of what to do when you have a flare (exacerbation) of chronic obstructive pulmonary disease (COPD). Your action plan is a color-coded plan that lists the symptoms that indicate whether your condition is under control and what actions to take. If you have symptoms in the green zone, it means you are doing well that day. If you have symptoms in the yellow zone, it means you are having a bad day or an exacerbation. If you have symptoms in the red zone, you need urgent medical care. Follow the plan that you and your health care provider developed. Review your plan with your health care provider at each visit. Red zone Symptoms in this zone mean that you should get medical help right away. They include: Feeling very short of breath, even when you are resting. Not being able to do any activities because of poor breathing. Not being able to sleep because of poor breathing. Fever or shaking chills. Feeling confused or very sleepy. Chest pain. Coughing up blood. If you have any of these symptoms, call emergency services (911 in the U.S[place] or go to the nearest emergency room. Yellow zone Symptoms in this zone mean that your condition may be getting worse. They include: Feeling more short of breath than usual. Having less energy for daily activities than usual. Phlegm or mucus that is thicker than usual. Needing to use your rescue inhaler or nebulizer more often than usual. More ankle swelling than usual. Coughing more than usual. Feeling like you have a chest cold. Trouble sleeping due to COPD symptoms. Decreased appetite. COPD medicines not  helping as much as usual. If you experience any "yellow" symptoms: Keep taking your daily medicines as directed. Use your quick-relief inhaler as told by your health care provider. If you were prescribed steroid medicine to take by mouth (oral medicine), start taking it as told by your health care provider. If you were prescribed an antibiotic medicine, start taking it as told by your health care provider. Do not stop taking the antibiotic even if you start to feel better. Use oxygen as told by your health care provider. Get more rest. Do your pursed-lip breathing exercises. Do not smoke. Avoid any irritants in the air. If your signs and symptoms do not improve after taking these steps, call your health care provider right away. Green zone Symptoms in this zone mean that you are doing well. They include: Being able to do your usual activities and exercise. Having the usual amount of coughing, including the same amount of phlegm or mucus. Being able to sleep well. Having a good appetite. Where to find more information: You  can find more information about COPD from: American Lung Association, My COPD Action Plan: www.lung.org COPD Foundation: www.copdfoundation.[place]: https://wilson-eaton.com/ Follow these instructions at home: Continue taking your daily medicines as told by your health care provider. Make sure you receive all the immunizations that your health care provider recommends, especially the pneumococcal and influenza vaccines. Wash your hands often with soap and water. Have family members wash their hands too. Regular hand washing can help prevent infections. Follow your usual exercise and diet plan. Avoid irritants in the air, such as smoke. Do not use any products that contain nicotine or tobacco. These products include cigarettes, chewing tobacco, and vaping devices, such as e-cigarettes. If you need help quitting, ask your health care  provider. Summary A COPD action plan tells you what to do when you have a flare (exacerbation) of chronic obstructive pulmonary disease (COPD). Follow each action plan for your symptoms. If you have any symptoms in the red zone, call emergency services (911 in the [place].S[place] or go to the nearest emergency room. This information is not intended to replace advice given to you by your health care provider. Make sure you discuss any questions you have with your health care provider. Document Revised: 05/21/2020 Document Reviewed: 05/21/2020 Elsevier Patient Education  [place]. Hypoglycemia Hypoglycemia is when the sugar (glucose) level in your blood is too low. Low blood sugar can happen to people who have diabetes and people who do not have diabetes. Low blood sugar can happen quickly, and it can be an emergency. What are the causes? This condition happens most often in people who have diabetes. It may be caused by: Diabetes medicine. Not eating enough, or not eating often enough. Doing more physical activity. Drinking alcohol on an empty stomach. If you do not have diabetes, this condition may be caused by: A tumor in the pancreas. Not eating enough, or not eating for long periods at a time (fasting). A very bad infection or illness. Problems after having weight loss (bariatric) surgery. Kidney failure or liver failure. Certain medicines. What increases the risk? This condition is more likely to develop in people who: Have diabetes and take medicines to lower their blood sugar. Abuse alcohol. Have a very bad illness. What are the signs or symptoms? Mild Hunger. Sweating and feeling clammy. Feeling dizzy or light-headed. Being sleepy or having trouble sleeping. Feeling like you may vomit (nauseous). A fast heartbeat. A headache. Blurry vision. Mood changes, such as: Being grouchy. Feeling worried or nervous (anxious). Tingling or loss of feeling (numbness) around your  mouth, lips, or tongue. Moderate Confusion and poor judgment. Behavior changes. Weakness. Uneven heartbeat. Trouble with moving (coordination). Very low Very low blood sugar (severe hypoglycemia) is a medical emergency. It can cause: Fainting. Seizures. Loss of consciousness (coma). Death. How is this treated? Treating low blood sugar Low blood sugar is often treated by eating or drinking something that has sugar in it right away. The food or drink should contain 15 grams of a fast-acting carb (carbohydrate). Options include: 4 oz (120 mL) of fruit juice. 4 oz (120 mL) of regular soda (not diet soda). A few pieces of hard candy. Check food labels to see how many pieces to eat for 15 grams. 1 Tbsp (15 mL) of sugar or honey. 4 glucose tablets. 1 tube of glucose gel. Treating low blood sugar if you have diabetes If you can think clearly and swallow safely, follow the 15:15 rule: Take 15 grams of  a fast-acting carb. Talk with your doctor about how much you should take. Always keep a source of fast-acting carb with you, such as: Glucose tablets (take 4 tablets). A few pieces of hard candy. Check food labels to see how many pieces to eat for 15 grams. 4 oz (120 mL) of fruit juice. 4 oz (120 mL) of regular soda (not diet soda). 1 Tbsp (15 mL) of honey or sugar. 1 tube of glucose gel. Check your blood sugar 15 minutes after you take the carb. If your blood sugar is still at or below 70 mg/dL (3.9 mmol/L), take 15 grams of a carb again. If your blood sugar does not go above 70 mg/dL (3.9 mmol/L) after 3 tries, get help right away. After your blood sugar goes back to normal, eat a meal or a snack within 1 hour.  Treating very low blood sugar If your blood sugar is below 54 mg/dL (3 mmol/L), you have very low blood sugar, or severe hypoglycemia. This is an emergency. Get medical help right away. If you have very low blood sugar and you cannot eat or drink, you will need to be given a  hormone called glucagon. A family member or friend should learn how to check your blood sugar and how to give you glucagon. Ask your doctor if you need to have an emergency glucagon kit at home. Very low blood sugar may also need to be treated in a hospital. Follow these instructions at home: General instructions Take over-the-counter and prescription medicines only as told by your doctor. Stay aware of your blood sugar as told by your doctor. If you drink alcohol: Limit how much you have to: 0-1 drink a day for women who are not pregnant. 0-2 drinks a day for men. Know how much alcohol is in your drink. In the [place].S., one drink equals one 12 oz bottle of beer (355 mL), one 5 oz glass of wine (148 mL), or one 1 oz glass of hard liquor (44 mL). Be sure to eat food when you drink alcohol. Know that your body absorbs alcohol quickly. This may lead to low blood sugar later. Be sure to keep checking your blood sugar. Keep all follow-up visits. If you have diabetes:  Always have a fast-acting carb (15 grams) with you to treat low blood sugar. Follow your diabetes care plan as told by your doctor. Make sure you: Know the symptoms of low blood sugar. Check your blood sugar as often as told. Always check it before and after exercise. Always check your blood sugar before you drive. Take your medicines as told. Follow your meal plan. Eat on time. Do not skip meals. Share your diabetes care plan with: Your work or school. People you live with. Carry a card or wear jewelry that says you have diabetes. Where to find more information American Diabetes Association: www.diabetes.org Contact a doctor if: You have trouble keeping your blood sugar in your target range. You have low blood sugar often. Get help right away if: You still have symptoms after you eat or drink something that contains 15 grams of fast-acting carb, and you cannot get your blood sugar above 70 mg/dL by following the 15:15  rule. Your blood sugar is below 54 mg/dL (3 mmol/L). You have a seizure. You faint. These symptoms may be an emergency. Get help right away. Call your local emergency services (911 in the [place] Do not wait to see if the symptoms will go away. Do not drive yourself  to the hospital. Summary Hypoglycemia happens when the level of sugar (glucose) in your blood is too low. Low blood sugar can happen to people who have diabetes and people who do not have diabetes. Low blood sugar can happen quickly, and it can be an emergency. Make sure you know the symptoms of low blood sugar and know how to treat it. Always keep a source of sugar (fast-acting carb) with you to treat low blood sugar. This information is not intended to replace advice given to you by your health care provider. Make sure you discuss any questions you have with your health care provider. Document Revised: 06/13/2020 Document Reviewed: 06/13/2020 Elsevier Patient Education  [place].

## 2022-08-14 ENCOUNTER — Telehealth: Payer: Self-pay

## 2022-08-14 NOTE — Telephone Encounter (Signed)
   Telephone encounter was:  Unsuccessful.  08/14/2022 Name: [name] MRN: [id number] DOB: July 06, 1956  Unsuccessful outbound call made today to assist with:  Food Insecurity  Outreach Attempt:  1st Attempt  A HIPAA compliant voice message was left requesting a return call.  Instructed patient to call back .   [place], Care Management  [phone] [address], [place], [place] [address] Phone: [phone] Email: [email or web address]

## 2022-08-17 ENCOUNTER — Telehealth: Payer: Self-pay

## 2022-08-17 NOTE — Telephone Encounter (Signed)
   Telephone encounter was:  Unsuccessful.  08/17/2022 Name: [name] MRN: [id number] DOB: 06-22-1956  Unsuccessful outbound call made today to assist with:  Food Insecurity and Home Modifications  Outreach Attempt:  2nd Attempt   Unable to [place], [place] Management  [phone] [address], [place], [place] [address] Phone: [phone] Email: [email or web address]

## 2022-08-18 ENCOUNTER — Telehealth: Payer: Self-pay

## 2022-08-18 NOTE — Telephone Encounter (Signed)
   Telephone encounter was:  Unsuccessful.  08/18/2022 Name: [name] MRN: [id number] DOB: 19-Aug-1955  Unsuccessful outbound call made today to assist with:  Food Insecurity  Outreach Attempt:  3rd Attempt.  Referral closed unable to contact patient.  A HIPAA compliant voice message was left requesting a return call.  Instructed patient to call back   [place], [place] Management  [phone] [address], [place], [place] [address] Phone: [phone] Email: [email or web address]

## 2022-08-18 NOTE — Patient Outreach (Signed)
  Care Coordination   08/18/2022 Name: [name] MRN: [id number] DOB: 1956-04-10   Care Coordination Outreach Attempts:  An unsuccessful telephone outreach was attempted for a scheduled appointment today.  Follow Up Plan:  Additional outreach attempts will be made to offer the patient care coordination information and services.   Encounter Outcome:  No Answer   Care Coordination Interventions:  No, not indicated    Daneen Schick, BSW, CDP Social Worker, Certified Dementia Practitioner [place] [facility]  Care Coordination [phone]

## 2022-08-27 ENCOUNTER — Telehealth: Payer: Self-pay | Admitting: *Deleted

## 2022-08-27 NOTE — Progress Notes (Signed)
  Care Coordination Note  08/27/2022 Name: [name] MRN: [id number] DOB: [birth year]  [name] is a 67 y.o. year old female who is a primary care patient of [name], [place] and is actively engaged with the care management team. I reached out to [name] by phone today to assist with re-scheduling an initial visit with the Licensed Clinical Social Worker  Follow up plan: Unsuccessful telephone outreach attempt made.   [place]  Direct Dial: [phone]

## 2022-09-04 DIAGNOSIS — H52209 Unspecified astigmatism, unspecified eye: Secondary | ICD-10-CM | POA: Diagnosis not present

## 2022-09-04 DIAGNOSIS — H524 Presbyopia: Secondary | ICD-10-CM | POA: Diagnosis not present

## 2022-09-04 DIAGNOSIS — H5203 Hypermetropia, bilateral: Secondary | ICD-10-CM | POA: Diagnosis not present

## 2022-09-04 NOTE — Progress Notes (Signed)
  Care Coordination Note  09/04/2022 Name: [name] MRN: [id number] DOB: [birth year]  [name] is a 67 y.o. year old female who is a primary care patient of [name], [place] and is actively engaged with the care management team. I reached out to [name] by phone today to assist with re-scheduling an initial visit with the Licensed Clinical Social Worker  Follow up plan: Telephone appointment with care management team member scheduled for:09/08/22  [place]  Direct Dial: [phone]

## 2022-09-07 ENCOUNTER — Encounter: Payer: Medicare HMO | Admitting: Internal Medicine

## 2022-09-08 ENCOUNTER — Ambulatory Visit: Payer: Self-pay | Admitting: *Deleted

## 2022-09-08 NOTE — Patient Outreach (Signed)
  Care Coordination   09/08/2022  Name: [name] MRN: [id number] DOB: May 04, 1956   Care Coordination Outreach Attempts:  An unsuccessful telephone outreach was attempted today to offer the patient information about available care coordination services as a benefit of their health plan. CSW unable to leave HIPAA compliant message on voicemail, as mailbox is full.  Follow Up Plan:  Additional outreach attempts will be made to offer the patient care coordination information and services.   Encounter Outcome:  No Answer.   Care Coordination Interventions:  No, not indicated.    Nat Christen, BSW, MSW, [name]  Licensed Education officer, [facility]  Mailing [place] [address], [place], [place] [address] Physical Address-[address], [place], [place] [address] Toll Free Main # [phone] Fax # [phone] Cell # [phone] [email or web address]

## 2022-09-09 ENCOUNTER — Telehealth: Payer: Self-pay | Admitting: Pulmonary Disease

## 2022-09-09 DIAGNOSIS — J9611 Chronic respiratory failure with hypoxia: Secondary | ICD-10-CM

## 2022-09-09 NOTE — Telephone Encounter (Signed)
There was an ono ordered in Nov - does pt need another ono?

## 2022-09-09 NOTE — Telephone Encounter (Signed)
Calling PT to set up FU appt. She is just now getting her O2 from [employer]. It has been months. Dr. Keturah Barre wanted a Overnight oximetry sched. Do we do that AFTER she gets her O2? If this is true is  there time to sched it before the appt I just made? No need to call PT. Just wanted Dr. to have what he needed before appt. TY.

## 2022-09-10 NOTE — Telephone Encounter (Signed)
Patient states needs order for oxygen. Patient using [facility] for oxygen. Patient phone number is [phone].

## 2022-09-11 NOTE — Telephone Encounter (Signed)
If they are the ones that did not provide her with appropriate oxygen therapy for the past four months, I do not understand how the order would be too "old". That sounds like a failure on their end. She also shouldn't have had to requalify for POC with them. I walked her in November on the POC and sent appropriate orders. It is ok to place a new order but can we please call [facility] and follow up on this? It's unacceptable that it took four months for this. Thanks.

## 2022-09-11 NOTE — Telephone Encounter (Signed)
Called and spoke with patient. She stated that she finally received her oxygen from [employer] yesterday after the order was placed in November. She qualified for the POC yesterday at [place] but they refused to give it to her because the order was too old. Per patient, they are requesting a new order for the POC.   Katie, since you signed the original POC order, do you mind signing this order as well? I will pend it.

## 2022-09-11 NOTE — Telephone Encounter (Signed)
Called and spoke with patient. She is aware that the order has been placed. I have sent a community message to [place] to follow up on this.   Nothing further needed at time of call.

## 2022-09-14 ENCOUNTER — Telehealth: Payer: Self-pay | Admitting: Pulmonary Disease

## 2022-09-14 NOTE — Telephone Encounter (Signed)
Pt. Calling back about 2.16.24 encounter that was signed but [employer] still has not been able to get resolved

## 2022-09-14 NOTE — Telephone Encounter (Signed)
[name], please advise if you have received a response from [employer] in regards to the community message you last sent.

## 2022-09-14 NOTE — Telephone Encounter (Signed)
I do not have community messages for this patient. I will send another message today to check on status of POC.

## 2022-09-15 NOTE — Telephone Encounter (Signed)
Message from [facility]     Liz Malady; Jobie Quaker, [place]; [place], [place]; Mariann Barter; 1 other Hello Cherina,  07/07/2022 Patient refused delivery due to pt not wanting concentrator and POC. She wanted just POC. Per [facility] policy both is required. Pt even called in with [employer] per the sales order.  07/07/2022 We soft voided the order waiting on patient to let us know if she would accept delivery.  07/13/2022 Called Pt no answer-Tried to deliver. Door was tagged.  07/13/2022 No contact many attempts  09/10/2022 let patient know that after 30 day and with her declining the fist order last year in December she would need to have update order sent in from doctor office   09/14/2022 1:17pm Attempted to call patient back but Voicemail is full.  09/14/2022 3:23 Attempted to leave voicemail but was full  Stated below that you may not be able to see i explained she would need new order, sat, office notes all within 30days. (new order is in).  Currently i am not seeing that a delivery was made by [facility] for home O2.  Please let me know how we can help get this patient taken care of.  Thanks!

## 2022-09-15 NOTE — Telephone Encounter (Signed)
Pt has an upcoming OV with Dr Erin Fulling 2/23. The walk and new order can be taken care of during that visit. Routing to [employer] and Dr. Erin Fulling as an Juluis Rainier.

## 2022-09-15 NOTE — Telephone Encounter (Signed)
[name]; Jobie Quaker, [place]; [place], [place]; [place], [place] PT refused O2 in December of 2023. She will need a new order (which is in) new sats (its dated for 05/2022 so we need update sats) and a updated face to face/office visit notes within 30days of sats/order.

## 2022-09-16 ENCOUNTER — Telehealth: Payer: Self-pay | Admitting: Nurse Practitioner

## 2022-09-16 NOTE — Telephone Encounter (Signed)
[place] to schedule their annual wellness visit. Appointment made for 07/26/2023.  Thank you,  Colletta Maryland,  [place] [facility] Direct Dial ??[phone]

## 2022-09-17 NOTE — Telephone Encounter (Signed)
Spoke with the pt  She states that she has been in contact with [employer] today and that they are working on her order Nothing further needed

## 2022-09-18 ENCOUNTER — Ambulatory Visit (INDEPENDENT_AMBULATORY_CARE_PROVIDER_SITE_OTHER): Payer: Medicare HMO | Admitting: Pulmonary Disease

## 2022-09-18 ENCOUNTER — Encounter: Payer: Self-pay | Admitting: Pulmonary Disease

## 2022-09-18 VITALS — BP 128/84 | HR 72 | Ht 65.0 in | Wt 212.4 lb

## 2022-09-18 DIAGNOSIS — J432 Centrilobular emphysema: Secondary | ICD-10-CM | POA: Diagnosis not present

## 2022-09-18 DIAGNOSIS — J0110 Acute frontal sinusitis, unspecified: Secondary | ICD-10-CM

## 2022-09-18 MED ORDER — PREDNISONE 10 MG PO TABS: 20.0000 mg | ORAL_TABLET | Freq: Every day | ORAL | 0 refills | Status: DC

## 2022-09-18 MED ORDER — AZITHROMYCIN 250 MG PO TABS: ORAL_TABLET | ORAL | 0 refills | Status: DC

## 2022-09-18 NOTE — Patient Instructions (Addendum)
Continue Breztri 2 puffs daily - rinse mouth out after each use  Continue to use albuterol inhaler 1-2 puffs every 4-6 hours as needed.   Start 2L of Oxygen at night when sleeping and use oxygen when walking/ambulatory.  Start '20mg'$  prednisone daily for 10 days for sinus infection  Start Zpak for sinus infection  Follow up in 6 months, call sooner if needed

## 2022-09-18 NOTE — Progress Notes (Unsigned)
Synopsis: Referred in November 2022 for shortness of breath by [name], NP  Subjective:   PATIENT ID: [name] GENDER: female DOB: 1955/11/23, MRN: [id number]  HPI  Chief Complaint  Patient presents with   Follow-up    3 mo f/u for emphysema. States she has been fighting a sinus infection for the past week. Productive cough with yellow phlegm.    [name] is a 67 year old woman, daily smoker with history of breast cancer, coronary artery disease, GERD, hypertension and hyperlipidemia who returns to pulmonary clinic for COPD.   She was seen by Eric Form 04/22/22 for lung cancer screening visit. She was seen on 06/02/22 by Roxan Diesel, NP for thrush and treated with nystatin. She is using breztri inhaler 2 puff  She is on 2L O2 for nocturnal hypoxemia. She qualified for ambulatory oxygen based on 6MWT via [employer]. She is having oxygen delivered today.  She is having sinus congestion and drainage. Some pressure but no pain. No fevers or chills.  She has remained off cigarettes.   OV 12/31/21 She was recently admitted 4/22 and 5/6 for septic shock due to pneumonia and COPD exacerbations. She was smoking up to admission and has not smoked since discharge. She is feeling better since hospitalizations. She continue on breztri 2 puffs twice daily and as needed albuterol. She is having trouble sleeping now. She is not receiving xanax from her PCP anymore. She is not using nicotine replacement at this time. She has sinus drainage and post-nasal drip  OV 06/17/21 She reports progressive shortness of breath over the past year or two. She has not been sleeping well over this same time period. She has cough with some mucous production and intermittent wheezing. She does report snoring and waking up to go to the bathroom. She is using symbicort 160-4.72mg 2 puffs twice daily and feels like it is getting stuck in her throat.   She is smoking 1 pack per day. She has been smoking for  50 years. She usually smokes 3 cartons per month and has cut back to 2 cartons per month recently.   She is on disability.   Past Medical History:  Diagnosis Date   Anxiety    Breast cancer ([place] 06/30/11   inv ductal, ER/PR +, her-2 -   CAD (coronary artery disease)    NSTEMI 4/08: OM3 occluded (PCI unsuccessful), dRCA 95% => BMS, inf HK, EF 50%;   b. MV 11/12:  IL ischemia => c.  [place]11/12:  dLAD 70%, OM1 70-80%, then occluded (no change from 2008), dOM filled L->L collats, mRCA stent occluded, dRCA filled L->R collats, EF 55-65% => med Rx (consider PCI of RCA if refractory angina)   Carotid stenosis    dopplers 5/08: 0-30% bilateral; 10/12: 0-39% B/L ICA => f/u 04/2013   Chronic kidney disease    COPD (chronic obstructive pulmonary disease) (HCC)    Depression    Depression with anxiety    GERD (gastroesophageal reflux disease)    OTC acid reducer prn   Headache(784.0)    sinus; occ. migraines   Hx of radiation therapy 08/24/11 to 10/07/11   L breast   Hyperlipidemia    Hypertension    under control; has been on med. x 4 yrs.   Myocardial infarction ([facility]    Stable angina    a. med Rx after cath 05/2011   Stress incontinence, female      Family History  Problem Relation Age of Onset  Heart disease Mother    Heart disease Father    Cancer Maternal Aunt        pt unaware of what kind   Cancer Maternal Grandmother        ovarian   Heart attack Sister    Thyroid disease Daughter    Anxiety disorder Daughter    Depression Daughter    Bipolar disorder Sister      Social History   Socioeconomic History   Marital status: Single    Spouse name: Not on file   Number of children: 1   Years of education: 12+   Highest education level: Some college, no degree  Occupational History   Occupation: retired  Tobacco Use   Smoking status: Former    Packs/day: 1.00    Years: 45.00    Total pack years: 45.00    Types: Cigarettes    Quit date: 11/13/2021    Years since  quitting: 0.8   Smokeless tobacco: Never   Tobacco comments:    on and off  Vaping Use   Vaping Use: Never used  Substance and Sexual Activity   Alcohol use: Yes    Alcohol/week: 1.0 standard drink of alcohol    Types: 1 Glasses of wine per week    Comment: occasionally   Drug use: No   Sexual activity: Never    Birth control/protection: Post-menopausal  Other Topics Concern   Not on file  Social History Narrative   Lives at home alone. She has a daughter and grandchildren that she interacts with often.    Social Determinants of Health   Financial Resource Strain: Medium Risk (08/13/2022)   Overall Financial Resource Strain (CARDIA)    Difficulty of Paying Living Expenses: Somewhat hard  Food Insecurity: Food Insecurity Present (08/13/2022)   Hunger Vital Sign    Worried About Running Out of Food in the Last Year: Sometimes true    Ran Out of Food in the Last Year: Sometimes true  Transportation Needs: No Transportation Needs (08/13/2022)   PRAPARE - Hydrologist (Medical): No    Lack of Transportation (Non-Medical): No  Physical Activity: Insufficiently Active (08/13/2022)   Exercise Vital Sign    Days of Exercise per Week: 3 days    Minutes of Exercise per Session: 10 min  Stress: No Stress Concern Present (08/13/2022)   [place]    Feeling of Stress : Only a little  Recent Concern: Stress - Stress Concern Present (07/23/2022)   [place]    Feeling of Stress : To some extent  Social Connections: Socially Isolated (08/13/2022)   Social Connection and Isolation Panel [NHANES]    Frequency of Communication with Friends and Family: More than three times a week    Frequency of Social Gatherings with Friends and Family: Once a week    Attends Religious Services: Never    Marine scientist or Organizations: No     Attends Archivist Meetings: Never    Marital Status: Never married  Intimate Partner Violence: Not At Risk (08/13/2022)   Humiliation, Afraid, Rape, and Kick questionnaire    Fear of Current or Ex-Partner: No    Emotionally Abused: No    Physically Abused: No    Sexually Abused: No     Allergies  Allergen Reactions   Food Other (See Comments)    Nutmeg= difficulty breathing  Nutmeg Oil (Myristica Oil) Hives   Other Other (See Comments)    Nutmeg= difficulty breathing   Amlodipine Swelling     Outpatient Medications Prior to Visit  Medication Sig Dispense Refill   albuterol (VENTOLIN HFA) 108 (90 Base) MCG/ACT inhaler Inhale 2 puffs into the lungs every 6 (six) hours as needed for wheezing or shortness of breath. 6.7 g 0   Alcohol Swabs (B-D SINGLE USE SWABS REGULAR) PADS check blood sugars twice daily Dx E11.9 200 each 0   aspirin EC 81 MG tablet Take 1 tablet (81 mg total) by mouth daily. Swallow whole. 30 tablet 0   atorvastatin (LIPITOR) 80 MG tablet Take 1 tablet (80 mg total) by mouth at bedtime. 90 tablet 1   Budeson-Glycopyrrol-Formoterol (BREZTRI AEROSPHERE) 160-9-4.8 MCG/ACT AERO INHALE 2 PUFFS INTO THE LUNGS IN THE MORNING AND AT BEDTIME. 32.1 g 0   Cholecalciferol (VITAMIN D3) 5000 units TABS Take 10,000 Units by mouth every evening.     cloNIDine (CATAPRES) 0.1 MG tablet Take 0.1 mg by mouth 2 (two) times daily.     escitalopram (LEXAPRO) 20 MG tablet Take 1 tablet (20 mg total) by mouth daily. 90 tablet 1   fish oil-omega-3 fatty acids 1000 MG capsule Take 1,000 mg by mouth at bedtime.     gabapentin (NEURONTIN) 100 MG capsule Take 1 capsule (100 mg total) by mouth at bedtime. 90 capsule 1   metFORMIN (GLUCOPHAGE) 500 MG tablet Take 1 tablet (500 mg total) by mouth 2 (two) times daily with a meal. 180 tablet 1   metoprolol tartrate (LOPRESSOR) 25 MG tablet Take 1 tablet (25 mg total) by mouth 2 (two) times daily. 180 tablet 1   pramipexole (MIRAPEX) 1 MG  tablet Take 1 tablet (1 mg total) by mouth in the morning and at bedtime. 180 tablet 1   vitamin B-12 (CYANOCOBALAMIN) 1000 MCG tablet Take 1 tablet (1,000 mcg total) by mouth daily. 30 tablet 0   Sodium Sulfate-Mag Sulfate-KCl (SUTAB) [phone] MG TABS Use as directed (Patient not taking: Reported on 08/13/2022) 24 tablet 0   amoxicillin (AMOXIL) 875 MG tablet Take by mouth. (Patient not taking: Reported on 08/13/2022)     furosemide (LASIX) 40 MG tablet Take 1 tablet (40 mg total) by mouth daily. (Patient not taking: Reported on 08/03/2022) 90 tablet 1   mirtazapine (REMERON) 15 MG tablet Take 1 tablet (15 mg total) by mouth at bedtime. (Patient not taking: Reported on 08/13/2022) 30 tablet 2   potassium chloride SA (KLOR-CON M) 20 MEQ tablet Take 1 tablet (20 mEq total) by mouth daily. 90 tablet 1   predniSONE (DELTASONE) 10 MG tablet Take 4 po qd x 2d then 3 po qd x 2d then 2 po qd x 2d then 1 po qd x 2d then stop (Patient not taking: Reported on 08/13/2022)     No facility-administered medications prior to visit.   Review of Systems  Constitutional:  Negative for chills, fever, malaise/fatigue and weight loss.  HENT:  Positive for congestion. Negative for sinus pain and sore throat.   Eyes: Negative.   Respiratory:  Positive for shortness of breath. Negative for cough, hemoptysis, sputum production and wheezing.   Cardiovascular:  Negative for chest pain, palpitations, orthopnea, claudication and leg swelling.  Gastrointestinal:  Negative for abdominal pain, heartburn, nausea and vomiting.  Genitourinary: Negative.   Musculoskeletal:  Negative for joint pain and myalgias.  Skin:  Negative for rash.  Neurological:  Negative for weakness.  Endo/Heme/Allergies: Negative.  Psychiatric/Behavioral: Negative.      Objective:   Vitals:   09/18/22 1119  BP: 128/84  Pulse: 72  SpO2: 94%  Weight: 220 lb (99.8 kg)  Height: '5\' 5"'$  (1.651 m)   Physical Exam Constitutional:      General: She  is not in acute distress.    Appearance: She is not ill-appearing.  HENT:     Head: Normocephalic and atraumatic.  Eyes:     General: No scleral icterus.    Conjunctiva/sclera: Conjunctivae normal.  Cardiovascular:     Rate and Rhythm: Normal rate and regular rhythm.     Pulses: Normal pulses.     Heart sounds: Normal heart sounds. No murmur heard. Pulmonary:     Effort: Pulmonary effort is normal.     Breath sounds: Decreased air movement present. Decreased breath sounds present. No wheezing, rhonchi or rales.  Musculoskeletal:     Right lower leg: No edema.     Left lower leg: No edema.  Skin:    General: Skin is warm and dry.  Neurological:     General: No focal deficit present.     Mental Status: She is alert.  Psychiatric:        Mood and Affect: Mood normal.        Behavior: Behavior normal.        Thought Content: Thought content normal.        Judgment: Judgment normal.    CBC    Component Value Date/Time   WBC 11.9 (H) 04/13/2022 1442   WBC 12.3 (H) 11/30/2021 0036   RBC 4.98 04/13/2022 1442   RBC 4.40 11/30/2021 0036   HGB 13.2 04/13/2022 1442   HGB 16.8 (H) 12/16/2011 1146   HCT 42.0 04/13/2022 1442   HCT 49.3 (H) 12/16/2011 1146   PLT 219 04/13/2022 1442   MCV 84 04/13/2022 1442   MCV 95.4 12/16/2011 1146   MCH 26.5 (L) 04/13/2022 1442   MCH 24.1 (L) 11/30/2021 0036   MCHC 31.4 (L) 04/13/2022 1442   MCHC 28.7 (L) 11/30/2021 0036   RDW 15.9 (H) 04/13/2022 1442   RDW 14.2 12/16/2011 1146   LYMPHSABS 0.9 04/13/2022 1442   LYMPHSABS 1.0 12/16/2011 1146   MONOABS 0.7 11/29/2021 1918   MONOABS 0.6 12/16/2011 1146   EOSABS 0.2 04/13/2022 1442   BASOSABS 0.1 04/13/2022 1442   BASOSABS 0.1 12/16/2011 1146      Latest Ref Rng & Units 04/13/2022    2:42 PM 11/30/2021   12:36 AM 11/29/2021    7:18 PM  BMP  Glucose 70 - 99 mg/dL 106  332  164   BUN 8 - 27 mg/dL '22  15  13   '$ Creatinine 0.57 - 1.00 mg/dL 1.07  1.29  1.14   BUN/Creat Ratio 12 - 28 21      Sodium 134 - 144 mmol/L 143  138  140   Potassium 3.5 - 5.2 mmol/L 5.2  3.4  3.9   Chloride 96 - 106 mmol/L 101  103  103   CO2 20 - 29 mmol/L '23  20  26   '$ Calcium 8.7 - 10.3 mg/dL 9.9  8.9  8.9    Chest imaging: CTA Chest 11/25/21 No evidence of pulmonary embolism.   Scattered atherosclerotic calcifications including coronary arteries.   Emphysematous changes with small BILATERAL pleural effusions and bibasilar atelectasis.   BILATERAL thyroid nodules 1.8 cm diameter; follow-up thyroid ultrasound recommended.   Aortic Atherosclerosis  CT Chest LCS 01/16/21 1.  Lung-RADS 3, probably benign findings. Short-term follow-up in 6 months is recommended with repeat low-dose chest CT without contrast. 6.9 mm apical right upper lobe nodule and 6.3 mm left lower lobe nodule.  2. Aortic atherosclerosis (ICD10-I70.0). Coronary artery calcification. 3.  Emphysema   PFT:     No data to display          Labs:  Path:  Echo 02/05/21: LVEF 60 to 65%.  LV diastolic parameters are consistent with grade 1 diastolic dysfunction.  Elevated left atrial pressure.  RV systolic function is normal.  RV size is normal.  PASP estimated 35.3 mmHg.  Mild thickening of aortic valve.  Mild to moderate aortic valve sclerosis/calcification.  Heart Catheterization 10/07/20: 1.  Patent left mainstem 2.  Patent LAD, supplies a large territory with 2 diagonal branches and wraps around the LV apex with no obstructive disease throughout its distribution 3.  Patent left circumflex with chronic occlusion of the first OM branch, late filling is seen in previous cardiac catheterization study 4.  Chronic occlusion of the mid RCA with extensive left-to-right collaterals supplied by an epicardial circumflex branch and septal perforating branches of the LAD 5.  Normal LV function with normal wall motion, no regional wall motion abnormalities.   Recommendations: Favor ongoing medical therapy.  Appears to have stable  coronary anatomy from old catheterization 10 years ago.  Will ask CTO specialist to review her films to see if there would be treatment options for refractory angina.  She is already on aggressive medical therapy with amlodipine, isosorbide, and metoprolol.  Assessment & Plan:   Centrilobular emphysema ([place])  Acute frontal sinusitis, recurrence not specified - Plan: predniSONE (DELTASONE) 10 MG tablet, azithromycin (ZITHROMAX) 250 MG tablet  Discussion: [name] is a 67 year old woman, daily smoker with history of breast cancer, coronary artery disease, GERD, hypertension and hyperlipidemia who returns to pulmonary clinic for COPD.   She is to continue breztri inhaler 2 puffs twice daily and as needed albuterol inhaler. She is to continue 2L O2 at night when sleeping and for ambulation.   We will start her prednisone '20mg'$  for 10 days for sinus infection and Zpak. She is to call if not feeling better.  Follow up in 6 months.  Freda Jackson, MD [facility] Office: [phone]   Current Outpatient Medications:    albuterol (VENTOLIN HFA) 108 (90 Base) MCG/ACT inhaler, Inhale 2 puffs into the lungs every 6 (six) hours as needed for wheezing or shortness of breath., Disp: 6.7 g, Rfl: 0   Alcohol Swabs (B-D SINGLE USE SWABS REGULAR) PADS, check blood sugars twice daily Dx E11.9, Disp: 200 each, Rfl: 0   aspirin EC 81 MG tablet, Take 1 tablet (81 mg total) by mouth daily. Swallow whole., Disp: 30 tablet, Rfl: 0   atorvastatin (LIPITOR) 80 MG tablet, Take 1 tablet (80 mg total) by mouth at bedtime., Disp: 90 tablet, Rfl: 1   azithromycin (ZITHROMAX) 250 MG tablet, Take as directed, Disp: 6 tablet, Rfl: 0   Budeson-Glycopyrrol-Formoterol (BREZTRI AEROSPHERE) 160-9-4.8 MCG/ACT AERO, INHALE 2 PUFFS INTO THE LUNGS IN THE MORNING AND AT BEDTIME., Disp: 32.1 g, Rfl: 0   Cholecalciferol (VITAMIN D3) 5000 units TABS, Take 10,000 Units by mouth every evening., Disp: , Rfl:     cloNIDine (CATAPRES) 0.1 MG tablet, Take 0.1 mg by mouth 2 (two) times daily., Disp: , Rfl:    escitalopram (LEXAPRO) 20 MG tablet, Take 1 tablet (20 mg total) by mouth daily.,  Disp: 90 tablet, Rfl: 1   fish oil-omega-3 fatty acids 1000 MG capsule, Take 1,000 mg by mouth at bedtime., Disp: , Rfl:    gabapentin (NEURONTIN) 100 MG capsule, Take 1 capsule (100 mg total) by mouth at bedtime., Disp: 90 capsule, Rfl: 1   metFORMIN (GLUCOPHAGE) 500 MG tablet, Take 1 tablet (500 mg total) by mouth 2 (two) times daily with a meal., Disp: 180 tablet, Rfl: 1   metoprolol tartrate (LOPRESSOR) 25 MG tablet, Take 1 tablet (25 mg total) by mouth 2 (two) times daily., Disp: 180 tablet, Rfl: 1   pramipexole (MIRAPEX) 1 MG tablet, Take 1 tablet (1 mg total) by mouth in the morning and at bedtime., Disp: 180 tablet, Rfl: 1   predniSONE (DELTASONE) 10 MG tablet, Take 2 tablets (20 mg total) by mouth daily with breakfast., Disp: 20 tablet, Rfl: 0   vitamin B-12 (CYANOCOBALAMIN) 1000 MCG tablet, Take 1 tablet (1,000 mcg total) by mouth daily., Disp: 30 tablet, Rfl: 0   Sodium Sulfate-Mag Sulfate-KCl (SUTAB) [phone] MG TABS, Use as directed (Patient not taking: Reported on 08/13/2022), Disp: 24 tablet, Rfl: 0

## 2022-09-20 ENCOUNTER — Encounter: Payer: Self-pay | Admitting: Pulmonary Disease

## 2022-09-21 ENCOUNTER — Encounter: Payer: Self-pay | Admitting: *Deleted

## 2022-09-21 ENCOUNTER — Ambulatory Visit: Payer: Medicare HMO | Admitting: *Deleted

## 2022-09-21 ENCOUNTER — Telehealth: Payer: Self-pay | Admitting: Pulmonary Disease

## 2022-09-21 NOTE — Telephone Encounter (Signed)
Will await call of fax from [facility] supply on oxygen order.

## 2022-09-21 NOTE — Telephone Encounter (Signed)
Patient states [facility] will be calling for order for oxygen. Alroy Bailiff is the contact person. Patient phone number is [phone].

## 2022-09-21 NOTE — Telephone Encounter (Signed)
Patient states needs order to go to [place] [facility] [place] now. [employer] needs order for portable oxygen concentrator. Catherine fax number is [phone]. Patient phone number is [phone].

## 2022-09-21 NOTE — Patient Instructions (Signed)
Visit Information  Thank you for taking time to visit with me today. Please don't hesitate to contact me if I can be of assistance to you.   Following are the goals we discussed today:   Goals Addressed               This Visit's Progress     Find Help in My Community with Home Repairs. (pt-stated)   On track     Care Coordination Interventions:  Interventions Today    Flowsheet Row Most Recent Value  Chronic Disease   Chronic disease during today's visit Diabetes, Chronic Obstructive Pulmonary Disease (COPD), Hypertension (HTN), Chronic Kidney Disease/End Stage Renal Disease (ESRD), Other  [Morbid Obesity, Generalized Anxiety Disorder, Depression, Food Insecurity & Unsafe Housing Conditions.]  General Interventions   General Interventions Discussed/Reviewed General Interventions Discussed, General Interventions Reviewed, Annual Eye Exam, Labs, Annual Foot Exam, Durable Medical Equipment (DME), Vaccines, Health Screening, [name], Doctor Visits, Communication with  [employer] Care Provider]  Labs Hgb A1c every 3 months, Kidney Function  Vaccines COVID-19, Flu, Pneumonia, RSV, Shingles, Tetanus/Pertussis/Diphtheria  [Encouraged]  Doctor Visits Discussed/Reviewed Doctor Visits Discussed, Doctor Visits Reviewed, Annual Wellness Visits, PCP, Specialist  [Encouraged]  Health Screening Bone Density, Colonoscopy, Mammogram  [Encouraged]  Durable Medical Equipment (DME) BP Cuff, Glucomoter, Oxygen, Walker  PCP/Specialist Visits Compliance with follow-up visit  [Encouraged]  Communication with PCP/Specialists, RN  Exercise Interventions   Exercise Discussed/Reviewed Exercise Discussed, Exercise Reviewed, Physical Activity, Assistive device use and maintanence  [Encouraged]  Physical Activity Discussed/Reviewed Physical Activity Discussed, Physical Activity Reviewed, Types of exercise, Home Exercise Program (HEP)  [Encouraged]  Education Interventions   Education Provided Provided  Engineer, site, Provided Web-based Education, Provided Education  Provided Verbal Education On Nutrition, Foot Care, Eye Care, Blood Sugar Monitoring, Mental Health/Coping with Illness, Applications, Exercise, Medication, Insurance Plans, [name], When to see the doctor  [place] Discussed, Mental Health Reviewed, Coping Strategies, Crisis, Suicide, Substance Abuse, Grief and Loss, Depression, Anxiety  Nutrition Interventions   Nutrition Discussed/Reviewed Nutrition Discussed, Nutrition Reviewed, Carbohydrate meal planning, Fluid intake, Portion sizes, Decreasing sugar intake, Increaing proteins, Decreasing fats, Decreasing salt  [Encouraged]  Pharmacy Interventions   Pharmacy Dicussed/Reviewed Pharmacy Topics Discussed, Pharmacy Topics Reviewed, Medication Adherence, Affording Medications  [Encouraged]  Safety Interventions   Safety Discussed/Reviewed Safety Discussed, Safety Reviewed  Advanced Directive Interventions   Advanced Directives Discussed/Reviewed Advanced Directives Discussed, Advanced Directives Reviewed     Assessed Social Determinant of Health Barriers. Discussed Plans for Ongoing Care Management Follow Up. Provided Tree surgeon Information for Care Management Team Members. Screened for Signs & Symptoms of Depression, Related to Chronic Disease State. PHQ2 & PHQ9 Depression Screen Completed & Results Reviewed.  Suicidal Ideation & Homicidal Ideation Assessed - None Present.   Access to Weapons Assessed - None Present.   Domestic Violence Assessed - None Present. Crisis Information, Agencies, Services & Resources Reviewed. Active Listening & Reflection Utilized.  Verbalization of Feelings Encouraged.  Emotional Support Provided. Caregiver Stress Acknowledged. Caregiver Resources Discussed. Caregiver Support Groups Offered. Self-Enrollment in Caregiver Support Group of Interest Emphasized,  from List Provided. Problem Solving Interventions Identified. Task-Centered Solutions Implemented.  Solution-Focused Strategies Developed.  Brief Cognitive Behavioral Therapy Performed. Acceptance & Commitment Therapy Introduced. Client-Centered Therapy Initiated. Reviewed Prescription Medications & Discussed Importance of Compliance. Quality of Sleep Assessed & Sleep Hygiene Techniques Promoted. Encouraged Referral to Psychiatrist for Psychotropic Medication Management. Encouraged Referral to Therapist for Psychotherapeutic Counseling Services. Please Review  the Following List of [employer], [employer], Mailed on 09/21/2022:             ~ Therapists & Psychiatrists in [place], [place]. ~ Marriage, Relationship, Individual & Family Counseling Services. ~ [place] in [place], [place]. ~ [place] in [place], [place]. ~ [employer] in [place], [place]. Please Review the Following List of [employer], [employer], Mailed on 09/21/2022:             ~ [employer]. ~ Resources for Seniors in [place], [place]. ~ [employer]. ~ Low Income Psychologist, forensic. ~ Programme researcher, broadcasting/film/video. ~ Production manager. ~ 2023 Medicaid Tips. ~ Medicaid Application. Please Review the Following List of [employer], [employer], Mailed on 09/21/2022:             ~ [place] in [place], [place].             ~ [facility] [place] in [place], [place].             ~ The [place] in [place], [place].      Our next appointment is by telephone on 09/30/2022 at 3:30 pm.  Please call the care guide team at [phone] if you need to cancel or reschedule your appointment.   If you are experiencing a Mental Health or [place] or need  someone to talk to, please call the Suicide and Crisis Lifeline: 988 call the [place] National Suicide Prevention Lifeline: (814)626-2306 or TTY: 612-260-5693 TTY 5042806327) to talk to a trained counselor call 1-800-273-TALK (toll free, 24 hour hotline) go to [facility] [address] [place] [phone]) call the [place] Line: [phone] call 911  Patient verbalizes understanding of instructions and care plan provided today and agrees to view in [place]. Active MyChart status and patient understanding of how to access instructions and care plan via MyChart confirmed with patient.     Telephone follow up appointment with care management team member scheduled for:  09/30/2022 at 3:30 pm.  Nat Christen, BSW, MSW, [place]  Licensed Clinical Social Worker  [place]  Mailing [place]. [address] [place],  [place] [address] Physical Address-[address], [place], [place] [address] Toll Free Main # [phone] Fax # [phone] Cell # [phone] [email or web address]

## 2022-09-21 NOTE — Telephone Encounter (Signed)
Called and spoke to patient and let her know that her oxygen order was sent to [facility] on 09/11/2022. She verbalized understanding and told me she will call [facility] for an update. Nothing further needed

## 2022-09-21 NOTE — Patient Outreach (Signed)
Care Coordination   Initial Visit Note   09/21/2022  Name: [name] MRN: [id number] DOB: August 30, 1955  [name] is a 67 y.o. year old female who sees [name], FNP for primary care. I spoke with [name] by phone today.  What matters to the patients health and wellness today?  Find Help in My Community with Home Repairs.   Goals Addressed               This Visit's Progress     Find Help in My Community with Home Repairs. (pt-stated)   On track     Care Coordination Interventions:  Interventions Today    Flowsheet Row Most Recent Value  Chronic Disease   Chronic disease during today's visit Diabetes, Chronic Obstructive Pulmonary Disease (COPD), Hypertension (HTN), Chronic Kidney Disease/End Stage Renal Disease (ESRD), Other  [Morbid Obesity, Generalized Anxiety Disorder, Depression, Food Insecurity & Unsafe Housing Conditions.]  General Interventions   General Interventions Discussed/Reviewed General Interventions Discussed, General Interventions Reviewed, Annual Eye Exam, Labs, Annual Foot Exam, Durable Medical Equipment (DME), Vaccines, Health Screening, [name], Doctor Visits, Communication with  [employer] Care Provider]  Labs Hgb A1c every 3 months, Kidney Function  Vaccines COVID-19, Flu, Pneumonia, RSV, Shingles, Tetanus/Pertussis/Diphtheria  [Encouraged]  Doctor Visits Discussed/Reviewed Doctor Visits Discussed, Doctor Visits Reviewed, Annual Wellness Visits, PCP, Specialist  [Encouraged]  Health Screening Bone Density, Colonoscopy, Mammogram  [Encouraged]  Durable Medical Equipment (DME) BP Cuff, Glucomoter, Oxygen, Walker  PCP/Specialist Visits Compliance with follow-up visit  [Encouraged]  Communication with PCP/Specialists, RN  Exercise Interventions   Exercise Discussed/Reviewed Exercise Discussed, Exercise Reviewed, Physical Activity, Assistive device use and maintanence  [Encouraged]  Physical Activity Discussed/Reviewed  Physical Activity Discussed, Physical Activity Reviewed, Types of exercise, Home Exercise Program (HEP)  [Encouraged]  Education Interventions   Education Provided Provided Engineer, site, Provided Web-based Education, Provided Education  Provided Verbal Education On Nutrition, Foot Care, Eye Care, Blood Sugar Monitoring, Mental Health/Coping with Illness, Applications, Exercise, Medication, Insurance Plans, [name], When to see the doctor  [place] Discussed, Mental Health Reviewed, Coping Strategies, Crisis, Suicide, Substance Abuse, Grief and Loss, Depression, Anxiety  Nutrition Interventions   Nutrition Discussed/Reviewed Nutrition Discussed, Nutrition Reviewed, Carbohydrate meal planning, Fluid intake, Portion sizes, Decreasing sugar intake, Increaing proteins, Decreasing fats, Decreasing salt  [Encouraged]  Pharmacy Interventions   Pharmacy Dicussed/Reviewed Pharmacy Topics Discussed, Pharmacy Topics Reviewed, Medication Adherence, Affording Medications  [Encouraged]  Safety Interventions   Safety Discussed/Reviewed Safety Discussed, Safety Reviewed  Advanced Directive Interventions   Advanced Directives Discussed/Reviewed Advanced Directives Discussed, Advanced Directives Reviewed     Assessed Social Determinant of Health Barriers. Discussed Plans for Ongoing Care Management Follow Up. Provided Tree surgeon Information for Care Management Team Members. Screened for Signs & Symptoms of Depression, Related to Chronic Disease State. PHQ2 & PHQ9 Depression Screen Completed & Results Reviewed.  Suicidal Ideation & Homicidal Ideation Assessed - None Present.   Access to Weapons Assessed - None Present.   Domestic Violence Assessed - None Present. Crisis Information, Agencies, Services & Resources Reviewed. Active Listening & Reflection Utilized.  Verbalization of Feelings Encouraged.  Emotional Support  Provided. Caregiver Stress Acknowledged. Caregiver Resources Discussed. Caregiver Support Groups Offered. Self-Enrollment in Caregiver Support Group of Interest Emphasized, from List Provided. Problem Solving Interventions Identified. Task-Centered Solutions Implemented.  Solution-Focused Strategies Developed.  Brief Cognitive Behavioral Therapy Performed. Acceptance & Commitment Therapy Introduced. Client-Centered Therapy Initiated. Reviewed Prescription Medications & Discussed  Importance of Compliance. Quality of Sleep Assessed & Sleep Hygiene Techniques Promoted. Encouraged Referral to Psychiatrist for Psychotropic Medication Management. Encouraged Referral to Therapist for Psychotherapeutic Counseling Services. Please Review the Following List of [employer], [employer], Mailed on 09/21/2022:             ~ Therapists & Psychiatrists in [place], [place]. ~ Marriage, Relationship, Individual & Family Counseling Services. ~ [place] in [place], [place]. ~ [place] in [place], [place]. ~ [employer] in [place], [place]. Please Review the Following List of [employer], [employer], Mailed on 09/21/2022:             ~ [employer]. ~ Resources for Seniors in [place], [place]. ~ [employer] of [employer]. ~ Low Income Psychologist, forensic. ~ Programme researcher, broadcasting/film/video. ~ Production manager. ~ 2023 Medicaid Tips. ~ Medicaid Application. Please Review the Following List of [employer], [employer], Mailed on 09/21/2022:             ~ [place] in [place], [place].             ~ [facility] [place] in [place], [place].             ~ The [place] in [place], [place].        SDOH assessments and interventions completed:  Yes.  SDOH  Interventions Today    Flowsheet Row Most Recent Value  SDOH Interventions   Food Insecurity Interventions Other (Comment)  [Provided Food Soil scientist, [employer].  Already Receiving Supplemental Nutrition Assistance Program Services.]  Housing Interventions Other (Comment)  [Provided List of Hospital doctor with Housing Repairs & Removal of Pests, Mice & Mold.]  Transportation Interventions Intervention Not Indicated, Patient Resources (Friends/Family), Payor Benefit, Other (Comment)  [Reminded of Research officer, political party through [employer] Services.]  Utilities Interventions Intervention Not Indicated  Alcohol Usage Interventions Intervention Not Indicated (Score <7)  Financial Strain Interventions Other (Comment)  [Provided Immunologist with Food, Rent, Utilities, Housing Repairs, Etc.]  Physical Activity Interventions Patient Refused  Stress Interventions Intervention Not Indicated, Offered [employer], Provide Counseling  Social Connections Interventions Intervention Not Indicated     Care Coordination Interventions:  Yes, provided.   Follow up plan: Follow up call scheduled for 09/30/2022 at 3:30 pm.  Encounter Outcome:  Pt. Visit Completed.   Nat Christen, BSW, MSW, [name]  Licensed Education officer, environmental Health System  Mailing [place] [address], [place], [place] [address] Physical Address-[address] [place], [place] [address] Toll Free Main # [phone] Fax # [phone] Cell # [phone] Di [email or web address]

## 2022-09-23 ENCOUNTER — Telehealth: Payer: Self-pay | Admitting: Nurse Practitioner

## 2022-09-23 DIAGNOSIS — J9611 Chronic respiratory failure with hypoxia: Secondary | ICD-10-CM | POA: Diagnosis not present

## 2022-09-23 NOTE — Telephone Encounter (Signed)
[facility] w/[employer] is calling as an advocate regarding PT order for O2. [employer] states she can not have a "Home Stationary O2 system" due to her indoor wiring. It will not accomadate this system. Because of that [facility] will not fill the order for a portable O2 system. Even if the provider authorizes.   Pls call PT after seeing if we have a way around it or if there is another HSE provider we can contact.

## 2022-09-23 NOTE — Telephone Encounter (Signed)
Left message for patient to call back  

## 2022-09-23 NOTE — Telephone Encounter (Signed)
[facility] said they have not recd the order we sent in on 2/16. Please call pt to advise @ [phone]   Please see closed encounter. This has been back a forth before and PT does not want to go another weekend w/o O2.

## 2022-09-25 ENCOUNTER — Telehealth: Payer: Self-pay | Admitting: *Deleted

## 2022-09-25 ENCOUNTER — Ambulatory Visit: Payer: Medicare HMO | Admitting: *Deleted

## 2022-09-25 ENCOUNTER — Telehealth: Payer: Medicare HMO

## 2022-09-25 NOTE — Chronic Care Management (AMB) (Addendum)
   09/25/2022  [name] 02-20-1956 [id number]   Enrollment status change to previously enrolled, care plan completed, transferred to care coordination team.  [name] [facility], BSN RN Case Manager [place] [phone]

## 2022-09-25 NOTE — Telephone Encounter (Signed)
   CCM RN Visit Note   09/25/22 Name: [name] MRN: [id number]      DOB: [birth year]  Subjective: [name] is a 67 y.o. year old female who is a primary care patient of [name] FNP. The patient was referred to the Chronic Care Management team for assistance with care management needs subsequent to provider initiation of CCM services and plan of care.      An unsuccessful telephone outreach was attempted today to contact the patient about Chronic Care Management needs.    Plan:Telephone follow up appointment with care management team member scheduled for:  upon care guide rescheduling.  [name] RNC, BSN RN Case Manager [place] [phone]

## 2022-09-28 ENCOUNTER — Telehealth: Payer: Self-pay | Admitting: *Deleted

## 2022-09-28 ENCOUNTER — Encounter: Payer: Medicare HMO | Admitting: *Deleted

## 2022-09-28 NOTE — Progress Notes (Signed)
Scheduled with Joelene Millin for 10/14/22  [place]  Direct Dial: [phone]

## 2022-09-28 NOTE — Progress Notes (Signed)
  Care Coordination   Note   09/28/2022 Name: DARSI FERRUCCI MRN: [id number] DOB: 29-Jul-1955  PEITYN MEDICO is a 67 y.o. year old female who sees [name], FNP for primary care. I reached out to [name] by phone today to offer care coordination services.  Ms. Griesemer was given information about Care Coordination services today including:   The Care Coordination services include support from the care team which includes your Nurse Coordinator, Clinical Social Worker, or Pharmacist.  The Care Coordination team is here to help remove barriers to the health concerns and goals most important to you. Care Coordination services are voluntary, and the patient may decline or stop services at any time by request to their care team member.   Care Coordination Consent Status: Patient agreed to services and verbal consent obtained.   Follow up plan:  Telephone appointment with care coordination team member scheduled for:  10/14/22  Encounter Outcome:  Pt. Scheduled  [place]  Direct Dial: [phone]

## 2022-09-30 ENCOUNTER — Ambulatory Visit: Payer: Self-pay | Admitting: *Deleted

## 2022-09-30 ENCOUNTER — Encounter: Payer: Self-pay | Admitting: *Deleted

## 2022-09-30 NOTE — Patient Outreach (Signed)
Care Coordination   Follow Up Visit Note   09/30/2022  Name: [name] MRN: [id number] DOB: 06/16/1956  [name] is a 67 y.o. year old female who sees [name], FNP for primary care. I spoke with [name] by phone today.  What matters to the patients health and wellness today?  Find Help in My Community.    Goals Addressed               This Visit's Progress     Find Help in My Community with Home Repairs. (pt-stated)   On track     Care Coordination Interventions:  Interventions Today    Flowsheet Row Most Recent Value  Chronic Disease   Chronic disease during today's visit Diabetes, Chronic Obstructive Pulmonary Disease (COPD), Hypertension (HTN), Chronic Kidney Disease/End Stage Renal Disease (ESRD), Other  [Morbid Obesity, Generalized Anxiety Disorder, Depression, Food Insecurity & Unsafe Housing Conditions.]  General Interventions   General Interventions Discussed/Reviewed General Interventions Discussed, General Interventions Reviewed, Annual Eye Exam, Labs, Annual Foot Exam, Durable Medical Equipment (DME), Vaccines, Health Screening, [name], Doctor Visits, Communication with  [employer] Care Provider]  Labs Hgb A1c every 3 months, Kidney Function  Vaccines COVID-19, Flu, Pneumonia, RSV, Shingles, Tetanus/Pertussis/Diphtheria  [Encouraged]  Doctor Visits Discussed/Reviewed Doctor Visits Discussed, Doctor Visits Reviewed, Annual Wellness Visits, PCP, Specialist  [Encouraged]  Health Screening Bone Density, Colonoscopy, Mammogram  [Encouraged]  Durable Medical Equipment (DME) BP Cuff, Glucomoter, Oxygen, Walker  PCP/Specialist Visits Compliance with follow-up visit  [Encouraged]  Communication with PCP/Specialists, RN  Exercise Interventions   Exercise Discussed/Reviewed Exercise Discussed, Exercise Reviewed, Physical Activity, Assistive device use and maintanence  [Encouraged]  Physical Activity Discussed/Reviewed Physical  Activity Discussed, Physical Activity Reviewed, Types of exercise, Home Exercise Program (HEP)  [Encouraged]  Education Interventions   Education Provided Provided Engineer, site, Provided Web-based Education, Provided Education  Provided Verbal Education On Nutrition, Foot Care, Eye Care, Blood Sugar Monitoring, Mental Health/Coping with Illness, Applications, Exercise, Medication, Insurance Plans, [name], When to see the doctor  [place] Discussed, Mental Health Reviewed, Coping Strategies, Crisis, Suicide, Substance Abuse, Grief and Loss, Depression, Anxiety  Nutrition Interventions   Nutrition Discussed/Reviewed Nutrition Discussed, Nutrition Reviewed, Carbohydrate meal planning, Fluid intake, Portion sizes, Decreasing sugar intake, Increaing proteins, Decreasing fats, Decreasing salt  [Encouraged]  Pharmacy Interventions   Pharmacy Dicussed/Reviewed Pharmacy Topics Discussed, Pharmacy Topics Reviewed, Medication Adherence, Affording Medications  [Encouraged]  Safety Interventions   Safety Discussed/Reviewed Safety Discussed, Safety Reviewed  Advanced Directive Interventions   Advanced Directives Discussed/Reviewed Advanced Directives Discussed, Advanced Directives Reviewed     Active Listening & Reflection Utilized.  Verbalization of Feelings Encouraged.  Emotional Support Provided. Caregiver Stress Acknowledged. Caregiver Resources Reviewed. Caregiver Support Groups Discussed. Self-Enrollment in Caregiver Support Group of Interest Emphasized. Problem Solving Interventions Implemented. Task-Centered Solutions Activated.  Solution-Focused Strategies Employed.  Brief Cognitive Behavioral Therapy Indicated. Acceptance & Commitment Therapy Initiated. Client-Centered Therapy Provided. Confirmed Receipt & Thoroughly Reviewed the Following List of Counseling Agencies, Services & Resources:             ~  Therapists & Psychiatrists in [place], [place]. ~ Marriage, Relationship, Individual & Family Counseling Services. ~ [place] in [place], [place]. ~ [place] in [place], [place]. ~ [employer] in [place], [place]. Please Begin [place], in An Effort to [place] &  Psychotropic Medication Management. Confirmed Receipt & Thoroughly Reviewed the Following List of Science writer, Services & Resources:             ~ [employer]. ~ Resources for Seniors in [place], [place]. ~ [employer] of [employer]. ~ Low Income Psychologist, forensic. ~ Programme researcher, broadcasting/film/video. ~ Production manager. ~ 2023 Medicaid Tips. ~ Medicaid Application. Please Begin [place], in An Effort to [place] with Rent, Utilities, Medical Care, Medications, Etc. Confirmed Receipt & Thoroughly Reviewed the Following List of [employer], Services & Resources:             ~ [place] in [place], [place].             ~ The [place] in [place], [place].             ~ The [place] in [place], [place]. Please [place], in An Effort to [employer] for [place].       SDOH assessments and interventions completed:  Yes.  Care Coordination Interventions:  Yes, provided.   Follow up plan: Follow up call scheduled for 10/08/2022 at 3:00 pm.  Encounter Outcome:  Pt. Visit Completed.   Nat Christen, BSW, MSW, [name]  Licensed Education officer, environmental Health System  Mailing [place] [address], [place], [place] [address] Physical Address-[address] [place], [place] [address] Toll Free Main # [phone] Fax # [phone] Cell # [phone] [email or web address]

## 2022-09-30 NOTE — Patient Instructions (Signed)
Visit Information  Thank you for taking time to visit with me today. Please don't hesitate to contact me if I can be of assistance to you.   Following are the goals we discussed today:   Goals Addressed               This Visit's Progress     Find Help in My Community with Home Repairs. (pt-stated)   On track     Care Coordination Interventions:  Interventions Today    Flowsheet Row Most Recent Value  Chronic Disease   Chronic disease during today's visit Diabetes, Chronic Obstructive Pulmonary Disease (COPD), Hypertension (HTN), Chronic Kidney Disease/End Stage Renal Disease (ESRD), Other  [Morbid Obesity, Generalized Anxiety Disorder, Depression, Food Insecurity & Unsafe Housing Conditions.]  General Interventions   General Interventions Discussed/Reviewed General Interventions Discussed, General Interventions Reviewed, Annual Eye Exam, Labs, Annual Foot Exam, Durable Medical Equipment (DME), Vaccines, Health Screening, [name], Doctor Visits, Communication with  [employer] Care Provider]  Labs Hgb A1c every 3 months, Kidney Function  Vaccines COVID-19, Flu, Pneumonia, RSV, Shingles, Tetanus/Pertussis/Diphtheria  [Encouraged]  Doctor Visits Discussed/Reviewed Doctor Visits Discussed, Doctor Visits Reviewed, Annual Wellness Visits, PCP, Specialist  [Encouraged]  Health Screening Bone Density, Colonoscopy, Mammogram  [Encouraged]  Durable Medical Equipment (DME) BP Cuff, Glucomoter, Oxygen, Walker  PCP/Specialist Visits Compliance with follow-up visit  [Encouraged]  Communication with PCP/Specialists, RN  Exercise Interventions   Exercise Discussed/Reviewed Exercise Discussed, Exercise Reviewed, Physical Activity, Assistive device use and maintanence  [Encouraged]  Physical Activity Discussed/Reviewed Physical Activity Discussed, Physical Activity Reviewed, Types of exercise, Home Exercise Program (HEP)  [Encouraged]  Education Interventions   Education Provided Provided  Engineer, site, Provided Web-based Education, Provided Education  Provided Verbal Education On Nutrition, Foot Care, Eye Care, Blood Sugar Monitoring, Mental Health/Coping with Illness, Applications, Exercise, Medication, Insurance Plans, [name], When to see the doctor  [employer] Discussed, Mental Health Reviewed, Coping Strategies, Crisis, Suicide, Substance Abuse, Grief and Loss, Depression, Anxiety  Nutrition Interventions   Nutrition Discussed/Reviewed Nutrition Discussed, Nutrition Reviewed, Carbohydrate meal planning, Fluid intake, Portion sizes, Decreasing sugar intake, Increaing proteins, Decreasing fats, Decreasing salt  [Encouraged]  Pharmacy Interventions   Pharmacy Dicussed/Reviewed Pharmacy Topics Discussed, Pharmacy Topics Reviewed, Medication Adherence, Affording Medications  [Encouraged]  Safety Interventions   Safety Discussed/Reviewed Safety Discussed, Safety Reviewed  Advanced Directive Interventions   Advanced Directives Discussed/Reviewed Advanced Directives Discussed, Advanced Directives Reviewed     Active Listening & Reflection Utilized.  Verbalization of Feelings Encouraged.  Emotional Support Provided. Caregiver Stress Acknowledged. Caregiver Resources Reviewed. Caregiver Support Groups Discussed. Self-Enrollment in Caregiver Support Group of Interest Emphasized. Problem Solving Interventions Implemented. Task-Centered Solutions Activated.  Solution-Focused Strategies Employed.  Brief Cognitive Behavioral Therapy Indicated. Acceptance & Commitment Therapy Initiated. Client-Centered Therapy Provided. Confirmed Receipt & Thoroughly Reviewed the Following List of Counseling Agencies, Services & Resources:             ~ Therapists & Psychiatrists in [place], [place]. ~ Marriage, Relationship, Individual & Family Counseling Services. ~ [facility] in  [place], [place]. ~ [place] in [place], [place]. ~ [employer] in [place], [place]. Please Begin [place], in An Effort to [place] & Psychotropic Medication Management. Confirmed Receipt & Thoroughly Reviewed the Following List of [employer], Services & Resources:             ~  [place] Programs. ~ Resources for Seniors in [place], [place]. ~ [employer] of [employer]. ~ Low Income Psychologist, forensic. ~ Programme researcher, broadcasting/film/video. ~ Production manager. ~ 2023 Medicaid Tips. ~ Medicaid Application. Please Begin [place], in An Effort to [place] with Rent, Utilities, Medical Care, Medications, Etc. Confirmed Receipt & Thoroughly Reviewed the Following List of [employer], Services & Resources:             ~ [place] in [place], [place].             ~ The [place] in [place], [place].             ~ The [place] in [place], [place]. Please [place], in An Effort to [employer] for [place].       Our next appointment is by telephone on 10/08/2022 at 3:00 pm.  Please call the care guide team at [phone] if you need to cancel or reschedule your appointment.   If you are experiencing a Mental Health or [place] or need someone to talk to, please call the Suicide and Crisis Lifeline: 988 call the [place] National Suicide Prevention Lifeline: 406-027-4941 or TTY: 573-336-6590 TTY 346-350-7294) to talk to a trained counselor call 1-800-273-TALK (toll free, 24 hour hotline) go to [facility]  Urgent Care [address], [place] [phone]) call the [place]: [phone] call 911  Patient verbalizes understanding of instructions and care plan provided today and agrees to view in [place]. Active MyChart status and patient understanding of how to access instructions and care plan via MyChart confirmed with patient.     Telephone follow up appointment with care management team member scheduled for:  10/08/2022 at 3:00 pm.  Nat Christen, BSW, MSW, [place]  Licensed Clinical Social Worker  [place]  Mailing [place]. [address], [place], [place] [address] Physical Address-[address] [place],  [address] Toll Free Main # [phone] Fax # [phone] Cell # [phone] [email or web address]

## 2022-10-08 ENCOUNTER — Encounter: Payer: Self-pay | Admitting: *Deleted

## 2022-10-08 ENCOUNTER — Telehealth: Payer: Self-pay | Admitting: Nurse Practitioner

## 2022-10-08 ENCOUNTER — Ambulatory Visit: Payer: Self-pay | Admitting: *Deleted

## 2022-10-08 NOTE — Telephone Encounter (Signed)
Spoke with pt who states she was able to pay out of pocket for a POC. [employer] did come out and told pt that she had to take both the home concentrator and POC but pt refused concentrator therefore had to refuse whole delivery. Pt is happy with POC currently. Nothing further needed at this time.

## 2022-10-08 NOTE — Patient Outreach (Signed)
Care Coordination   Follow Up Visit Note   10/08/2022  Name: [name] MRN: [id number] DOB: 22-Sep-1955  [name] is a 67 y.o. year old female who sees [name], FNP for primary care. I spoke with [name] by phone today.  What matters to the patients health and wellness today?   Find Help in My Community with Home Repairs.   Goals Addressed               This Visit's Progress     Find Help in My Community with Home Repairs. (pt-stated)   On track     Care Coordination Interventions:  Interventions Today    Flowsheet Row Most Recent Value  Chronic Disease   Chronic disease during today's visit Diabetes, Chronic Obstructive Pulmonary Disease (COPD), Hypertension (HTN), Chronic Kidney Disease/End Stage Renal Disease (ESRD), Other  [Morbid Obesity, Generalized Anxiety Disorder, Depression, Food Insecurity & Unsafe Housing Conditions.]  General Interventions   General Interventions Discussed/Reviewed General Interventions Discussed, General Interventions Reviewed, Annual Eye Exam, Labs, Annual Foot Exam, Durable Medical Equipment (DME), Vaccines, Health Screening, [name], Doctor Visits, Communication with  [employer] Care Provider]  Labs Hgb A1c every 3 months, Kidney Function  Vaccines COVID-19, Flu, Pneumonia, RSV, Shingles, Tetanus/Pertussis/Diphtheria  [Encouraged]  Doctor Visits Discussed/Reviewed Doctor Visits Discussed, Doctor Visits Reviewed, Annual Wellness Visits, PCP, Specialist  [Encouraged]  Health Screening Bone Density, Colonoscopy, Mammogram  [Encouraged]  Durable Medical Equipment (DME) BP Cuff, Glucomoter, Oxygen, Walker  PCP/Specialist Visits Compliance with follow-up visit  [Encouraged]  Communication with PCP/Specialists, RN  Exercise Interventions   Exercise Discussed/Reviewed Exercise Discussed, Exercise Reviewed, Physical Activity, Assistive device use and maintanence  [Encouraged]  Physical Activity  Discussed/Reviewed Physical Activity Discussed, Physical Activity Reviewed, Types of exercise, Home Exercise Program (HEP)  [Encouraged]  Education Interventions   Education Provided Provided Engineer, site, Provided Web-based Education, Provided Education  Provided Verbal Education On Nutrition, Foot Care, Eye Care, Blood Sugar Monitoring, Mental Health/Coping with Illness, Applications, Exercise, Medication, Insurance Plans, [name], When to see the doctor  [place] Discussed, Mental Health Reviewed, Coping Strategies, Crisis, Suicide, Substance Abuse, Grief and Loss, Depression, Anxiety  Nutrition Interventions   Nutrition Discussed/Reviewed Nutrition Discussed, Nutrition Reviewed, Carbohydrate meal planning, Fluid intake, Portion sizes, Decreasing sugar intake, Increaing proteins, Decreasing fats, Decreasing salt  [Encouraged]  Pharmacy Interventions   Pharmacy Dicussed/Reviewed Pharmacy Topics Discussed, Pharmacy Topics Reviewed, Medication Adherence, Affording Medications  [Encouraged]  Safety Interventions   Safety Discussed/Reviewed Safety Discussed, Safety Reviewed  Advanced Directive Interventions   Advanced Directives Discussed/Reviewed Advanced Directives Discussed, Advanced Directives Reviewed     Active Listening & Reflection Utilized.  Verbalization of Feelings Encouraged.  Emotional Support Provided. Caregiver Stress Acknowledged. Caregiver Resources Reviewed. Caregiver Support Groups Discussed. Self-Enrollment in Caregiver Support Group of Interest Emphasized. Problem Solving Interventions Implemented. Task-Centered Solutions Activated.  Solution-Focused Strategies Employed.  Brief Cognitive Behavioral Therapy Indicated. Acceptance & Commitment Therapy Initiated. Client-Centered Therapy Provided. Continued Review of The Following List & Encouraged Tree surgeon with Agencies, [place], in An Effort to Obtain Psychotropic Medication Management & Psychotherapeutic Counseling Services:             ~ Therapists & Psychiatrists in [place], [place]. ~ Marriage, Relationship, Individual & Family Counseling Services. ~ [place] in [place], [place]. ~ [place] in [place], [place]. ~ [employer] in [place], [place]. Continued Review of  The Following List & Encouraged Tree surgeon with Agencies, [place], in An Effort to [place]:             ~ [employer]. ~ Resources for Seniors in [place], [place]. ~ [employer] of [employer]. ~ Low Income Psychologist, forensic. ~ Programme researcher, broadcasting/film/video. ~ Production manager. ~ 2023 Medicaid Tips. ~ Medicaid Application. Continued Review of The Following List & Encouraged Tree surgeon with Agencies, [place], in An Effort to [place]:             ~ [place] in [place], [place].             ~ The [place] in [place], [place].             ~ The [place] in [place], [place]. Please Contact CSW Directly (# [phone]), if You Have Questions, Need Assistance, or If Additional Social Work Needs Are Identified Between Now & Our Next Scheduled Telephone [employer].      SDOH assessments and interventions completed:  Yes.  Care Coordination Interventions:  Yes, provided.   Follow up plan: Follow up call scheduled for 10/27/2022 at 10:15 am.  Encounter Outcome:  Pt. Visit Completed.   Nat Christen, BSW, MSW, [name]  Licensed Education officer, [facility]  Mailing [place]. [address] [place], [place] [address] Physical Address-[address] [place], [place]  [address] Toll Free Main # [phone] Fax # [phone] Cell # [phone] [email or web address]

## 2022-10-08 NOTE — Patient Instructions (Signed)
Visit Information  Thank you for taking time to visit with me today. Please don't hesitate to contact me if I can be of assistance to you.   Following are the goals we discussed today:   Goals Addressed               This Visit's Progress     Find Help in My Community with Home Repairs. (pt-stated)   On track     Care Coordination Interventions:  Interventions Today    Flowsheet Row Most Recent Value  Chronic Disease   Chronic disease during today's visit Diabetes, Chronic Obstructive Pulmonary Disease (COPD), Hypertension (HTN), Chronic Kidney Disease/End Stage Renal Disease (ESRD), Other  [Morbid Obesity, Generalized Anxiety Disorder, Depression, Food Insecurity & Unsafe Housing Conditions.]  General Interventions   General Interventions Discussed/Reviewed General Interventions Discussed, General Interventions Reviewed, Annual Eye Exam, Labs, Annual Foot Exam, Durable Medical Equipment (DME), Vaccines, Health Screening, [name], Doctor Visits, Communication with  [employer] Care Provider]  Labs Hgb A1c every 3 months, Kidney Function  Vaccines COVID-19, Flu, Pneumonia, RSV, Shingles, Tetanus/Pertussis/Diphtheria  [Encouraged]  Doctor Visits Discussed/Reviewed Doctor Visits Discussed, Doctor Visits Reviewed, Annual Wellness Visits, PCP, Specialist  [Encouraged]  Health Screening Bone Density, Colonoscopy, Mammogram  [Encouraged]  Durable Medical Equipment (DME) BP Cuff, Glucomoter, Oxygen, Walker  PCP/Specialist Visits Compliance with follow-up visit  [Encouraged]  Communication with PCP/Specialists, RN  Exercise Interventions   Exercise Discussed/Reviewed Exercise Discussed, Exercise Reviewed, Physical Activity, Assistive device use and maintanence  [Encouraged]  Physical Activity Discussed/Reviewed Physical Activity Discussed, Physical Activity Reviewed, Types of exercise, Home Exercise Program (HEP)  [Encouraged]  Education Interventions   Education Provided Provided  Engineer, site, Provided Web-based Education, Provided Education  Provided Verbal Education On Nutrition, Foot Care, Eye Care, Blood Sugar Monitoring, Mental Health/Coping with Illness, Applications, Exercise, Medication, Insurance Plans, [name], When to see the doctor  [place] Discussed, Mental Health Reviewed, Coping Strategies, Crisis, Suicide, Substance Abuse, Grief and Loss, Depression, Anxiety  Nutrition Interventions   Nutrition Discussed/Reviewed Nutrition Discussed, Nutrition Reviewed, Carbohydrate meal planning, Fluid intake, Portion sizes, Decreasing sugar intake, Increaing proteins, Decreasing fats, Decreasing salt  [Encouraged]  Pharmacy Interventions   Pharmacy Dicussed/Reviewed Pharmacy Topics Discussed, Pharmacy Topics Reviewed, Medication Adherence, Affording Medications  [Encouraged]  Safety Interventions   Safety Discussed/Reviewed Safety Discussed, Safety Reviewed  Advanced Directive Interventions   Advanced Directives Discussed/Reviewed Advanced Directives Discussed, Advanced Directives Reviewed     Active Listening & Reflection Utilized.  Verbalization of Feelings Encouraged.  Emotional Support Provided. Caregiver Stress Acknowledged. Caregiver Resources Reviewed. Caregiver Support Groups Discussed. Self-Enrollment in Caregiver Support Group of Interest Emphasized. Problem Solving Interventions Implemented. Task-Centered Solutions Activated.  Solution-Focused Strategies Employed.  Brief Cognitive Behavioral Therapy Indicated. Acceptance & Commitment Therapy Initiated. Client-Centered Therapy Provided. Continued Review of The Following List & Encouraged Tree surgeon with Agencies, [place], in An Effort to Obtain Psychotropic Medication Management & Psychotherapeutic Counseling Services:             ~ Therapists & Psychiatrists in [place], [place]. ~ Marriage,  Relationship, Individual & Family Counseling Services. ~ [place] in [place], [place]. ~ [place] in [place], [place]. ~ [employer] in [place], [place]. Continued Review of The Following List & Encouraged Tree surgeon with Agencies, [place], in An Effort to [place]:             ~  [place] Programs. ~ Resources for Seniors in [place], [place]. ~ [employer] of [employer]. ~ Low Income Psychologist, forensic. ~ Programme researcher, broadcasting/film/video. ~ Production manager. ~ 2023 Medicaid Tips. ~ Medicaid Application. Continued Review of The Following List & Encouraged Tree surgeon with Agencies, [place], in An Effort to [place]:             ~ [place] in [place], [place].             ~ The [place] in [place], [place].             ~ The [place] in [place], [place]. Please Contact CSW Directly (# [phone]), if You Have Questions, Need Assistance, or If Additional Social Work Needs Are Identified Between Now & Our Next Scheduled Telephone [employer].      Our next appointment is by telephone on 10/27/2022 at 10:15 am.  Please call the care guide team at [phone] if you need to cancel or reschedule your appointment.   If you are experiencing a Mental Health or [place] or need someone to talk to, please call the Suicide and Crisis Lifeline: 988 call the [place] National Suicide Prevention Lifeline: 857-182-0134 or TTY: 2344028120 TTY (570)222-9780) to talk to a trained counselor call 1-800-273-TALK (toll free, 24 hour hotline) go to [facility]gent Care [address], [place] [phone]) call the [place]: [phone] call  911  Patient verbalizes understanding of instructions and care plan provided today and agrees to view in [place]. Active MyChart status and patient understanding of how to access instructions and care plan via MyChart confirmed with patient.     Telephone follow up appointment with care management team member scheduled for:  10/27/2022 at 10:15 am.  Nat Christen, BSW, MSW, [place]  Licensed Clinical Social Worker  [place]  Mailing [place]. [address], [place], [place] [address] Physical Address-[address], [place], [place] [address] Toll Free Main # [phone] Fax # [phone] Cell # [phone] [email or web address]

## 2022-10-08 NOTE — Telephone Encounter (Signed)
ATC no voicemail. Closing encounter per office policy

## 2022-10-08 NOTE — Telephone Encounter (Signed)
Rerouting to Triage for 2nd attempt to call and then close encounter/ Thank you.

## 2022-10-08 NOTE — Telephone Encounter (Signed)
Patient is returning phone call. Patient phone number is [phone].

## 2022-10-12 ENCOUNTER — Ambulatory Visit (INDEPENDENT_AMBULATORY_CARE_PROVIDER_SITE_OTHER): Payer: Medicare HMO | Admitting: Nurse Practitioner

## 2022-10-12 ENCOUNTER — Encounter: Payer: Self-pay | Admitting: Nurse Practitioner

## 2022-10-12 VITALS — BP 165/84 | HR 63 | Temp 97.6°F | Resp 20 | Ht 66.0 in | Wt 222.0 lb

## 2022-10-12 DIAGNOSIS — I10 Essential (primary) hypertension: Secondary | ICD-10-CM

## 2022-10-12 DIAGNOSIS — J449 Chronic obstructive pulmonary disease, unspecified: Secondary | ICD-10-CM | POA: Diagnosis not present

## 2022-10-12 DIAGNOSIS — N182 Chronic kidney disease, stage 2 (mild): Secondary | ICD-10-CM

## 2022-10-12 DIAGNOSIS — I2510 Atherosclerotic heart disease of native coronary artery without angina pectoris: Secondary | ICD-10-CM | POA: Diagnosis not present

## 2022-10-12 DIAGNOSIS — J9611 Chronic respiratory failure with hypoxia: Secondary | ICD-10-CM

## 2022-10-12 DIAGNOSIS — E1122 Type 2 diabetes mellitus with diabetic chronic kidney disease: Secondary | ICD-10-CM

## 2022-10-12 DIAGNOSIS — F3342 Major depressive disorder, recurrent, in full remission: Secondary | ICD-10-CM

## 2022-10-12 DIAGNOSIS — Z0001 Encounter for general adult medical examination with abnormal findings: Secondary | ICD-10-CM | POA: Diagnosis not present

## 2022-10-12 DIAGNOSIS — E785 Hyperlipidemia, unspecified: Secondary | ICD-10-CM | POA: Diagnosis not present

## 2022-10-12 DIAGNOSIS — E1169 Type 2 diabetes mellitus with other specified complication: Secondary | ICD-10-CM | POA: Diagnosis not present

## 2022-10-12 DIAGNOSIS — F5101 Primary insomnia: Secondary | ICD-10-CM

## 2022-10-12 DIAGNOSIS — G2581 Restless legs syndrome: Secondary | ICD-10-CM

## 2022-10-12 DIAGNOSIS — E559 Vitamin D deficiency, unspecified: Secondary | ICD-10-CM

## 2022-10-12 DIAGNOSIS — I6523 Occlusion and stenosis of bilateral carotid arteries: Secondary | ICD-10-CM

## 2022-10-12 DIAGNOSIS — N1831 Chronic kidney disease, stage 3a: Secondary | ICD-10-CM

## 2022-10-12 DIAGNOSIS — R079 Chest pain, unspecified: Secondary | ICD-10-CM | POA: Diagnosis not present

## 2022-10-12 DIAGNOSIS — Z0000 Encounter for general adult medical examination without abnormal findings: Secondary | ICD-10-CM

## 2022-10-12 DIAGNOSIS — Z9861 Coronary angioplasty status: Secondary | ICD-10-CM

## 2022-10-12 DIAGNOSIS — F411 Generalized anxiety disorder: Secondary | ICD-10-CM

## 2022-10-12 LAB — BAYER DCA HB A1C WAIVED: HB A1C (BAYER DCA - WAIVED): 6.9 % — ABNORMAL HIGH (ref 4.8–5.6)

## 2022-10-12 MED ORDER — ATORVASTATIN CALCIUM 80 MG PO TABS: 80.0000 mg | ORAL_TABLET | Freq: Every day | ORAL | 1 refills | Status: DC

## 2022-10-12 MED ORDER — ZOLPIDEM TARTRATE 10 MG PO TABS: 10.0000 mg | ORAL_TABLET | Freq: Every evening | ORAL | 1 refills | Status: DC | PRN

## 2022-10-12 MED ORDER — ESCITALOPRAM OXALATE 20 MG PO TABS: 20.0000 mg | ORAL_TABLET | Freq: Every day | ORAL | 1 refills | Status: DC

## 2022-10-12 MED ORDER — CLONIDINE HCL 0.2 MG PO TABS: 0.2000 mg | ORAL_TABLET | Freq: Two times a day (BID) | ORAL | 1 refills | Status: DC

## 2022-10-12 MED ORDER — PRAMIPEXOLE DIHYDROCHLORIDE 1 MG PO TABS: 1.0000 mg | ORAL_TABLET | Freq: Two times a day (BID) | ORAL | 1 refills | Status: DC

## 2022-10-12 MED ORDER — METOPROLOL TARTRATE 25 MG PO TABS: 25.0000 mg | ORAL_TABLET | Freq: Two times a day (BID) | ORAL | 1 refills | Status: DC

## 2022-10-12 MED ORDER — METFORMIN HCL 500 MG PO TABS: 500.0000 mg | ORAL_TABLET | Freq: Two times a day (BID) | ORAL | 1 refills | Status: DC

## 2022-10-12 NOTE — Progress Notes (Signed)
Subjective:    Patient ID: [name], female    DOB: September 15, 1955, 67 y.o.   MRN: [id number]   Chief Complaint: annual physical    HPI:  [name] is a 67 y.o. who identifies as a female who was assigned female at birth.   Social history: Lives with: by herself- family checks on her frequently Work history: retired   Scientist, forensic in today for follow up of the following chronic medical issues:  1. Primary hypertension No c/o chest pain, sob or headache. Does not check blood pressure at  home. Recently added clonidine to meds. BP Readings from Last 3 Encounters:  10/12/22 (!) 165/84  09/18/22 128/84  08/03/22 137/69      2. CAD S/P percutaneous coronary angioplasty According to chart she has not seen cardiology in awhile.  3. Bilateral carotid artery stenosis Last carotid doppler study was done 08/25/18. Showed no significant blockage.  4. Chronic respiratory failure with hypoxia (HCC) 5. Chronic obstructive pulmonary disease, unspecified COPD type ([place]) Last saw Dr. Erin Fulling on 09/18/22. She was encouraged to stop smoking. Continue breztri. O2 at night.  6. Type 2 diabetes mellitus with stage 2 chronic kidney disease, without long-term current use of insulin (HCC) Fasting blood sugars are running around 120-150. No low blood sugars. Lab Results  Component Value Date   HGBA1C 6.5 (H) 04/13/2022     7. Hyperlipidemia associated with type 2 diabetes mellitus ([place]) Does not watch diet and does no dedicated exercise. Lab Results  Component Value Date   CHOL 157 04/13/2022   HDL 47 04/13/2022   LDLCALC 75 04/13/2022   LDLDIRECT 100.0 05/16/2007   TRIG 213 (H) 04/13/2022   CHOLHDL 3.3 04/13/2022     8. Stage 3a chronic kidney disease (HCC) No voiding issues and no SOB. ] Lab Results  Component Value Date   CREATININE 1.07 (H) 04/13/2022     9. Restless leg syndrome Is on Mirapex and neurontin at night which helps with her leg jerks.  10. Recurrent  major depressive disorder, in full remission ([place]) Is on lexapro daily. Has good days and bad days.    10/12/2022    2:06 PM 09/21/2022    1:14 PM 08/13/2022   12:43 PM  Depression screen PHQ 2/9  Decreased Interest 0 0 0  Down, Depressed, Hopeless 0 0 1  PHQ - 2 Score 0 0 1  Altered sleeping 0    Tired, decreased energy 0    Change in appetite 0    Feeling bad or failure about yourself  0    Trouble concentrating 0    Moving slowly or fidgety/restless 0    Suicidal thoughts 0    PHQ-9 Score 0    Difficult doing work/chores Not difficult at all       11. Generalized anxiety disorder Stays anxious    10/12/2022    2:06 PM 08/03/2022    3:03 PM 04/13/2022    2:17 PM 01/08/2022    2:45 PM  GAD 7 : Generalized Anxiety Score  Nervous, Anxious, on Edge 0 0 3 3  Control/stop worrying 0 0 2 2  Worry too much - different things 0 0 2 2  Trouble relaxing 0 0 3 3  Restless 0 0 3   Easily annoyed or irritable 0 0 2 2  Afraid - awful might happen 0 0 2 2  Total GAD 7 Score 0 0 17   Anxiety Difficulty Not difficult at all Not difficult  at all Very difficult Very difficult     54. Vitamin D deficiency Is on vitamin d supplement daily, doe snot always remember to take.  13. Morbid obesity ([place]) Weight is up 10  lbs Wt Readings from Last 3 Encounters:  10/12/22 222 lb (100.7 kg)  09/18/22 212 lb 6.4 oz (96.3 kg)  08/03/22 222 lb (100.7 kg)   BMI Readings from Last 3 Encounters:  10/12/22 35.83 kg/m  09/18/22 35.35 kg/m  08/03/22 36.94 kg/m     New complaints: Insomnia- OTC meds make her feel like a zoombie. Currently only sleeps about 3 hours a night   Allergies  Allergen Reactions   Food Other (See Comments)    Nutmeg= difficulty breathing   Nutmeg Oil (Myristica Oil) Hives   Other Other (See Comments)    Nutmeg= difficulty breathing   Amlodipine Swelling   Outpatient Encounter Medications as of 10/12/2022  Medication Sig   albuterol (VENTOLIN HFA) 108 (90 Base)  MCG/ACT inhaler Inhale 2 puffs into the lungs every 6 (six) hours as needed for wheezing or shortness of breath.   Alcohol Swabs (B-D SINGLE USE SWABS REGULAR) PADS check blood sugars twice daily Dx E11.9   aspirin EC 81 MG tablet Take 1 tablet (81 mg total) by mouth daily. Swallow whole.   atorvastatin (LIPITOR) 80 MG tablet Take 1 tablet (80 mg total) by mouth at bedtime.   Budeson-Glycopyrrol-Formoterol (BREZTRI AEROSPHERE) 160-9-4.8 MCG/ACT AERO INHALE 2 PUFFS INTO THE LUNGS IN THE MORNING AND AT BEDTIME.   Cholecalciferol (VITAMIN D3) 5000 units TABS Take 10,000 Units by mouth every evening.   cloNIDine (CATAPRES) 0.1 MG tablet Take 0.1 mg by mouth 2 (two) times daily.   escitalopram (LEXAPRO) 20 MG tablet Take 1 tablet (20 mg total) by mouth daily.   fish oil-omega-3 fatty acids 1000 MG capsule Take 1,000 mg by mouth at bedtime.   gabapentin (NEURONTIN) 100 MG capsule Take 1 capsule (100 mg total) by mouth at bedtime.   metFORMIN (GLUCOPHAGE) 500 MG tablet Take 1 tablet (500 mg total) by mouth 2 (two) times daily with a meal.   metoprolol tartrate (LOPRESSOR) 25 MG tablet Take 1 tablet (25 mg total) by mouth 2 (two) times daily.   pramipexole (MIRAPEX) 1 MG tablet Take 1 tablet (1 mg total) by mouth in the morning and at bedtime.   Sodium Sulfate-Mag Sulfate-KCl (SUTAB) [phone] MG TABS Use as directed (Patient not taking: Reported on 08/13/2022)   vitamin B-12 (CYANOCOBALAMIN) 1000 MCG tablet Take 1 tablet (1,000 mcg total) by mouth daily.   [DISCONTINUED] azithromycin (ZITHROMAX) 250 MG tablet Take as directed   [DISCONTINUED] predniSONE (DELTASONE) 10 MG tablet Take 2 tablets (20 mg total) by mouth daily with breakfast.   No facility-administered encounter medications on file as of 10/12/2022.    Past Surgical History:  Procedure Laterality Date   AXILLARY LYMPH NODE DISSECTION  07/31/2011   Procedure: AXILLARY LYMPH NODE DISSECTION;  Surgeon: [name], MD;  Location: [facility];  Service: General;  Laterality: Left;  left axillary sentinal node biopsy   BREAST LUMPECTOMY Left 06/30/2011   BREAST SURGERY     CARDIAC CATHETERIZATION  11/02/2006; 06/03/2011   CATARACT EXTRACTION Bilateral    CESAREAN SECTION  1987   CORONARY STENT PLACEMENT  11/02/2006   ELBOW SURGERY     right   LEFT HEART CATH AND CORONARY ANGIOGRAPHY N/A 10/07/2020   Procedure: LEFT HEART CATH AND CORONARY ANGIOGRAPHY;  Surgeon: [name], MD;  Location: [facility];  Service: Cardiovascular;  Laterality: N/A;    Family History  Problem Relation Age of Onset   Heart disease Mother    Heart disease Father    Cancer Maternal Aunt        pt unaware of what kind   Cancer Maternal Grandmother        ovarian   Heart attack Sister    Thyroid disease Daughter    Anxiety disorder Daughter    Depression Daughter    Bipolar disorder Sister       Controlled substance contract: 10/12/22     Review of Systems  Constitutional:  Negative for diaphoresis.  Eyes:  Negative for pain.  Respiratory:  Negative for shortness of breath.   Cardiovascular:  Negative for chest pain, palpitations and leg swelling.  Gastrointestinal:  Negative for abdominal pain.  Endocrine: Negative for polydipsia.  Skin:  Negative for rash.  Neurological:  Negative for dizziness, weakness and headaches.  Hematological:  Does not bruise/bleed easily.  All other systems reviewed and are negative.      Objective:   Physical Exam Vitals and nursing note reviewed.  Constitutional:      General: She is not in acute distress.    Appearance: Normal appearance. She is well-developed.  HENT:     Head: Normocephalic.     Right Ear: Tympanic membrane normal.     Left Ear: Tympanic membrane normal.     Nose: Nose normal.     Mouth/Throat:     Mouth: Mucous membranes are moist.  Eyes:     Pupils: Pupils are equal, round, and reactive to light.  Neck:     Vascular: No carotid bruit or  JVD.  Cardiovascular:     Rate and Rhythm: Normal rate and regular rhythm.     Heart sounds: Normal heart sounds.  Pulmonary:     Effort: Pulmonary effort is normal. No respiratory distress.     Breath sounds: Normal breath sounds. No wheezing or rales.  Chest:     Chest wall: No tenderness.  Abdominal:     General: Bowel sounds are normal. There is no distension or abdominal bruit.     Palpations: Abdomen is soft. There is no hepatomegaly, splenomegaly, mass or pulsatile mass.     Tenderness: There is no abdominal tenderness.  Musculoskeletal:        General: Normal range of motion.     Cervical back: Normal range of motion and neck supple.  Lymphadenopathy:     Cervical: No cervical adenopathy.  Skin:    General: Skin is warm and dry.  Neurological:     Mental Status: She is alert and oriented to person, place, and time.     Deep Tendon Reflexes: Reflexes are normal and symmetric.  Psychiatric:        Behavior: Behavior normal.        Thought Content: Thought content normal.        Judgment: Judgment normal.    BP (!) 165/84   Pulse 63   Temp 97.6 F (36.4 C) (Temporal)   Resp 20   Ht 5\' 6"  (1.676 m)   Wt 222 lb (100.7 kg)   LMP 10/17/2005   SpO2 92%   BMI 35.83 kg/m   HGBa1c 6.9%  EKG- sinus rhythym-Preliminary reading by [name], FNP  [facility]       Assessment & Plan:   [name] comes in today with chief complaint of Medical Management  of Chronic Issues   Diagnosis and orders addressed:  1. Annual physical exam   2. Primary hypertension Low sodium diet Increased clonidine to 0.2mg  bid - CBC with Differential/Platelet - CMP14+EGFR - metoprolol tartrate (LOPRESSOR) 25 MG tablet; Take 1 tablet (25 mg total) by mouth 2 (two) times daily.  Dispense: 180 tablet; Refill: 1 - cloNIDine (CATAPRES) 0.2 MG tablet; Take 1 tablet (0.2 mg total) by mouth 2 (two) times daily.  Dispense: 180 tablet; Refill: 1  3. CAD S/P percutaneous coronary  angioplasty Needs to follow up with cardiology  4. Bilateral carotid artery stenosis Will repeat scan later on in year  5. Chronic respiratory failure with hypoxia (HCC) 6. Chronic obstructive pulmonary disease, unspecified COPD type ([place]) Keep follow up with pulmonology  7. Type 2 diabetes mellitus with stage 2 chronic kidney disease, without long-term current use of insulin (HCC) Continue to watch carbs in diet - Bayer DCA Hb A1c Waived - metFORMIN (GLUCOPHAGE) 500 MG tablet; Take 1 tablet (500 mg total) by mouth 2 (two) times daily with a meal.  Dispense: 180 tablet; Refill: 1  8. Hyperlipidemia associated with type 2 diabetes mellitus (HCC) Low fat diet - Lipid panel  9. Stage 3a chronic kidney disease ([place]) Labs pending  10. Restless leg syndrome Keep legs warm at night - pramipexole (MIRAPEX) 1 MG tablet; Take 1 tablet (1 mg total) by mouth in the morning and at bedtime.  Dispense: 180 tablet; Refill: 1  11. Recurrent major depressive disorder, in full remission ([place]) Stress management - escitalopram (LEXAPRO) 20 MG tablet; Take 1 tablet (20 mg total) by mouth daily.  Dispense: 90 tablet; Refill: 1  12. Generalized anxiety disorder  13. Vitamin D deficiency Continue vitamin d supplement  14. Morbid obesity ([place]) Discussed diet and exercise for person with BMI >25 Will recheck weight in 3-6 months   15. Primary insomnia Bedtime routine Added ambien  - zolpidem (AMBIEN) 10 MG tablet; Take 1 tablet (10 mg total) by mouth at bedtime as needed for sleep.  Dispense: 15 tablet; Refill: 1  16. Chest pain, unspecified type EKG normal If worsens need to see cardiology - EKG 12-Lead   Labs pending Health Maintenance reviewed Diet and exercise encouraged  Follow up plan: 6 months   [place], FNP

## 2022-10-13 LAB — CBC WITH DIFFERENTIAL/PLATELET
Basophils Absolute: 0 10*3/uL (ref 0.0–0.2)
Basos: 0 %
EOS (ABSOLUTE): 0.2 10*3/uL (ref 0.0–0.4)
Eos: 2 %
Hematocrit: 42.7 % (ref 34.0–46.6)
Hemoglobin: 13.5 g/dL (ref 11.1–15.9)
Immature Grans (Abs): 0 10*3/uL (ref 0.0–0.1)
Immature Granulocytes: 0 %
Lymphocytes Absolute: 0.8 10*3/uL (ref 0.7–3.1)
Lymphs: 9 %
MCH: 27.6 pg (ref 26.6–33.0)
MCHC: 31.6 g/dL (ref 31.5–35.7)
MCV: 87 fL (ref 79–97)
Monocytes Absolute: 0.5 10*3/uL (ref 0.1–0.9)
Monocytes: 5 %
Neutrophils Absolute: 8.3 10*3/uL — ABNORMAL HIGH (ref 1.4–7.0)
Neutrophils: 84 %
Platelets: 206 10*3/uL (ref 150–450)
RBC: 4.89 x10E6/uL (ref 3.77–5.28)
RDW: 14.7 % (ref 11.7–15.4)
WBC: 9.9 10*3/uL (ref 3.4–10.8)

## 2022-10-13 LAB — CMP14+EGFR
ALT: 11 IU/L (ref 0–32)
AST: 11 IU/L (ref 0–40)
Albumin/Globulin Ratio: 1.7 (ref 1.2–2.2)
Albumin: 4 g/dL (ref 3.9–4.9)
Alkaline Phosphatase: 96 IU/L (ref 44–121)
BUN/Creatinine Ratio: 14 (ref 12–28)
BUN: 15 mg/dL (ref 8–27)
Bilirubin Total: 0.3 mg/dL (ref 0.0–1.2)
CO2: 23 mmol/L (ref 20–29)
Calcium: 9.4 mg/dL (ref 8.7–10.3)
Chloride: 105 mmol/L (ref 96–106)
Creatinine, Ser: 1.09 mg/dL — ABNORMAL HIGH (ref 0.57–1.00)
Globulin, Total: 2.4 g/dL (ref 1.5–4.5)
Glucose: 115 mg/dL — ABNORMAL HIGH (ref 70–99)
Potassium: 4.7 mmol/L (ref 3.5–5.2)
Sodium: 144 mmol/L (ref 134–144)
Total Protein: 6.4 g/dL (ref 6.0–8.5)
eGFR: 56 mL/min/{1.73_m2} — ABNORMAL LOW (ref >59)

## 2022-10-13 LAB — LIPID PANEL
Chol/HDL Ratio: 2.8 ratio (ref 0.0–4.4)
Cholesterol, Total: 126 mg/dL (ref 100–199)
HDL: 45 mg/dL (ref >39)
LDL Chol Calc (NIH): 57 mg/dL (ref 0–99)
Triglycerides: 136 mg/dL (ref 0–149)
VLDL Cholesterol Cal: 24 mg/dL (ref 5–40)

## 2022-10-14 ENCOUNTER — Ambulatory Visit: Payer: Self-pay | Admitting: *Deleted

## 2022-10-14 NOTE — Patient Outreach (Signed)
  Care Coordination   10/14/2022 Name: [name] MRN: [id number] DOB: March 21, 1956   Care Coordination Outreach Attempts:  An unsuccessful telephone outreach was attempted today to offer the patient information about available care coordination services as a benefit of their health plan.   Follow Up Plan:  Additional outreach attempts will be made to offer the patient care coordination information and services.   Encounter Outcome:  No Answer   Care Coordination Interventions:  No, not indicated    Dashel Goines L. Lavina Hamman, RN, BSN, [place] Coordinator Office number [phone]

## 2022-10-16 ENCOUNTER — Other Ambulatory Visit: Payer: Self-pay | Admitting: Nurse Practitioner

## 2022-10-16 ENCOUNTER — Telehealth: Payer: Self-pay | Admitting: Nurse Practitioner

## 2022-10-16 NOTE — Telephone Encounter (Signed)
Patient states needs cpt code and diagnosis code for oxygen. Filling out form for oxygen concentrator. Patient phone number is [phone].

## 2022-10-16 NOTE — Telephone Encounter (Signed)
Provided patient with diagnosis code for oxygen. Advised she can call adapt to provide her with the CPT code if needed. She verbalized understanding. Nothing further needed.

## 2022-10-21 ENCOUNTER — Encounter: Payer: Medicare HMO | Admitting: Internal Medicine

## 2022-10-26 ENCOUNTER — Ambulatory Visit: Payer: Self-pay | Admitting: *Deleted

## 2022-10-26 NOTE — Patient Outreach (Signed)
  Care Coordination   10/26/2022 Name: [name] MRN: [id number] DOB: 1955/09/02   Care Coordination Outreach Attempts:  A second unsuccessful outreach was attempted today to offer the patient with information about available care coordination services as a benefit of their health plan.     Follow Up Plan:  Additional outreach attempts will be made to offer the patient care coordination information and services.   Encounter Outcome:  No Answer   Care Coordination Interventions:  No, not indicated    Gizell Danser L. Lavina Hamman, RN, BSN, [place] Coordinator Office number [phone]

## 2022-10-27 ENCOUNTER — Ambulatory Visit: Payer: Self-pay | Admitting: *Deleted

## 2022-10-27 ENCOUNTER — Encounter: Payer: Self-pay | Admitting: *Deleted

## 2022-10-27 NOTE — Patient Instructions (Signed)
Visit Information  Thank you for taking time to visit with me today. Please don't hesitate to contact me if I can be of assistance to you.   Following are the goals we discussed today:   Goals Addressed               This Visit's Progress     Find Help in My Community with Home Repairs. (pt-stated)   On track     Care Coordination Interventions:  Interventions Today    Flowsheet Row Most Recent Value  Chronic Disease   Chronic disease during today's visit Diabetes, Chronic Obstructive Pulmonary Disease (COPD), Hypertension (HTN), Chronic Kidney Disease/End Stage Renal Disease (ESRD), Other  [Morbid Obesity, Generalized Anxiety Disorder, Depression, Food Insecurity & Unsafe Housing Conditions.]  General Interventions   General Interventions Discussed/Reviewed General Interventions Discussed, General Interventions Reviewed, Annual Eye Exam, Labs, Annual Foot Exam, Durable Medical Equipment (DME), Vaccines, Health Screening, [name], Doctor Visits, Communication with  [employer] Care Provider]  Labs Hgb A1c every 3 months, Kidney Function  Vaccines COVID-19, Flu, Pneumonia, RSV, Shingles, Tetanus/Pertussis/Diphtheria  [Encouraged]  Doctor Visits Discussed/Reviewed Doctor Visits Discussed, Doctor Visits Reviewed, Annual Wellness Visits, PCP, Specialist  [Encouraged]  Health Screening Bone Density, Colonoscopy, Mammogram  [Encouraged]  Durable Medical Equipment (DME) BP Cuff, Glucomoter, Oxygen, Walker  PCP/Specialist Visits Compliance with follow-up visit  [Encouraged]  Communication with PCP/Specialists, RN  Exercise Interventions   Exercise Discussed/Reviewed Exercise Discussed, Exercise Reviewed, Physical Activity, Assistive device use and maintanence  [Encouraged]  Physical Activity Discussed/Reviewed Physical Activity Discussed, Physical Activity Reviewed, Types of exercise, Home Exercise Program (HEP)  [Encouraged]  Education Interventions   Education Provided Provided  Engineer, site, Provided Web-based Education, Provided Education  Provided Verbal Education On Nutrition, Foot Care, Eye Care, Blood Sugar Monitoring, Mental Health/Coping with Illness, Applications, Exercise, Medication, Insurance Plans, [name], When to see the doctor  Streeter Discussed, Mental Health Reviewed, Coping Strategies, Crisis, Suicide, Substance Abuse, Grief and Loss, Depression, Anxiety  Nutrition Interventions   Nutrition Discussed/Reviewed Nutrition Discussed, Nutrition Reviewed, Carbohydrate meal planning, Fluid intake, Portion sizes, Decreasing sugar intake, Increaing proteins, Decreasing fats, Decreasing salt  [Encouraged]  Pharmacy Interventions   Pharmacy Dicussed/Reviewed Pharmacy Topics Discussed, Pharmacy Topics Reviewed, Medication Adherence, Affording Medications  [Encouraged]  Safety Interventions   Safety Discussed/Reviewed Safety Discussed, Safety Reviewed  Advanced Directive Interventions   Advanced Directives Discussed/Reviewed Advanced Directives Discussed, Advanced Directives Reviewed     Active Listening & Reflection Utilized.  Verbalization of Feelings Encouraged.  Emotional Support Provided. Caregiver Stress Acknowledged. Caregiver Resources Reviewed. Caregiver Support Groups Discussed. Self-Enrollment in Caregiver Support Group of Interest Emphasized. Problem Solving Interventions Indicated. Task-Centered Solutions Activated.  Solution-Focused Strategies Employed.  Cognitive Behavioral Therapy Performed. Acceptance & Commitment Therapy Initiated. Client-Centered Therapy Provided. Please Contact Counseling Agencies, Services & Resources of Interest, in An Effort to Obtain Psychotropic Medication Management & Psychotherapeutic Counseling Services:             ~ Therapists & Psychiatrists in [place], [place]. ~ Marriage, Relationship, Individual & Family Counseling  Services. ~ [facility] in [place], [place]. ~ [facility] in [place], [place]. ~ [employer] in [place], [place]. Please Art therapist, Community education officer of Interest, in An Effort to [place]:             ~ [employer]. ~ Resources for Seniors in [place], [place]. ~ [employer] of  [place] Program Application. ~ Emergency Assistance Programs. ~ Production manager. ~ 2023 Medicaid Tips. ~ Medicaid Application. Please Contact Food & Nutritional Agencies, Community education officer, in An Effort to [place]:             ~ [facility] in [place], [place].             ~ [facility] in [place], [place].             ~ [facility] in [place], [place]. Please Contact CSW Directly (# [phone]), if You Have Questions, Need Assistance, or If Additional Social Work Needs Are Identified Between Now & Our Next Scheduled Telephone [employer].      Our next appointment is by telephone on 11/10/2022 at 9:30 am.   Please call the care guide team at [phone] if you need to cancel or reschedule your appointment.   If you are experiencing a Mental Health or [place] or need someone to talk to, please call the Suicide and Crisis Lifeline: 988 call the [place] National Suicide Prevention Lifeline: 320-052-3476 or TTY: (330)452-1111 TTY (434) 231-2833) to talk to a trained counselor call 1-800-273-TALK (toll free, 24 hour hotline) go to [facility] [address] [place] [phone]) call the [place]: [phone] call 911  Patient verbalizes understanding of instructions and care plan provided today and agrees to view in [place]. Active MyChart status and  patient understanding of how to access instructions and care plan via MyChart confirmed with patient.     Telephone follow up appointment with care management team member scheduled for:  11/10/2022 at 9:30 am.   Nat Christen, BSW, MSW, [place]  Licensed Clinical Social Worker  [place]  Mailing [place]. [address] [place], [place] [address] Physical Address-[address], [place], [place] [address] Toll Free Main # [phone] Fax # [phone] Cell # [phone] [email or web address]

## 2022-10-27 NOTE — Patient Outreach (Signed)
Care Coordination   Follow Up Visit Note   10/27/2022  Name: [name] MRN: [id number] DOB: 1956-02-07  [name] is a 67 y.o. year old female who sees [name], FNP for primary care. I spoke with [name] by phone today.  What matters to the patients health and wellness today?  Find Help in My Community.   Goals Addressed               This Visit's Progress     Find Help in My Community with Home Repairs. (pt-stated)   On track     Care Coordination Interventions:  Interventions Today    Flowsheet Row Most Recent Value  Chronic Disease   Chronic disease during today's visit Diabetes, Chronic Obstructive Pulmonary Disease (COPD), Hypertension (HTN), Chronic Kidney Disease/End Stage Renal Disease (ESRD), Other  [Morbid Obesity, Generalized Anxiety Disorder, Depression, Food Insecurity & Unsafe Housing Conditions.]  General Interventions   General Interventions Discussed/Reviewed General Interventions Discussed, General Interventions Reviewed, Annual Eye Exam, Labs, Annual Foot Exam, Durable Medical Equipment (DME), Vaccines, Health Screening, [name], Doctor Visits, Communication with  [employer] Care Provider]  Labs Hgb A1c every 3 months, Kidney Function  Vaccines COVID-19, Flu, Pneumonia, RSV, Shingles, Tetanus/Pertussis/Diphtheria  [Encouraged]  Doctor Visits Discussed/Reviewed Doctor Visits Discussed, Doctor Visits Reviewed, Annual Wellness Visits, PCP, Specialist  [Encouraged]  Health Screening Bone Density, Colonoscopy, Mammogram  [Encouraged]  Durable Medical Equipment (DME) BP Cuff, Glucomoter, Oxygen, Walker  PCP/Specialist Visits Compliance with follow-up visit  [Encouraged]  Communication with PCP/Specialists, RN  Exercise Interventions   Exercise Discussed/Reviewed Exercise Discussed, Exercise Reviewed, Physical Activity, Assistive device use and maintanence  [Encouraged]  Physical Activity Discussed/Reviewed Physical  Activity Discussed, Physical Activity Reviewed, Types of exercise, Home Exercise Program (HEP)  [Encouraged]  Education Interventions   Education Provided Provided Engineer, site, Provided Web-based Education, Provided Education  Provided Verbal Education On Nutrition, Foot Care, Eye Care, Blood Sugar Monitoring, Mental Health/Coping with Illness, Applications, Exercise, Medication, Insurance Plans, [name], When to see the doctor  [place] Discussed, Mental Health Reviewed, Coping Strategies, Crisis, Suicide, Substance Abuse, Grief and Loss, Depression, Anxiety  Nutrition Interventions   Nutrition Discussed/Reviewed Nutrition Discussed, Nutrition Reviewed, Carbohydrate meal planning, Fluid intake, Portion sizes, Decreasing sugar intake, Increaing proteins, Decreasing fats, Decreasing salt  [Encouraged]  Pharmacy Interventions   Pharmacy Dicussed/Reviewed Pharmacy Topics Discussed, Pharmacy Topics Reviewed, Medication Adherence, Affording Medications  [Encouraged]  Safety Interventions   Safety Discussed/Reviewed Safety Discussed, Safety Reviewed  Advanced Directive Interventions   Advanced Directives Discussed/Reviewed Advanced Directives Discussed, Advanced Directives Reviewed     Active Listening & Reflection Utilized.  Verbalization of Feelings Encouraged.  Emotional Support Provided. Caregiver Stress Acknowledged. Caregiver Resources Reviewed. Caregiver Support Groups Discussed. Self-Enrollment in Caregiver Support Group of Interest Emphasized. Problem Solving Interventions Indicated. Task-Centered Solutions Activated.  Solution-Focused Strategies Employed.  Cognitive Behavioral Therapy Performed. Acceptance & Commitment Therapy Initiated. Client-Centered Therapy Provided. Please Contact Counseling Agencies, Services & Resources of Interest, in An Effort to Obtain Psychotropic Medication Management &  Psychotherapeutic Counseling Services:             ~ Therapists & Psychiatrists in [place], [place]. ~ Marriage, Relationship, Individual & Family Counseling Services. ~ [place] in [place], [place]. ~ [place] in [place], [place]. ~ [employer] in [place], [place]. Please Art therapist, Services & Resources of Interest, in An Effort to [place]:             ~  [place] Programs. ~ Resources for Seniors in [place], [place]. ~ [employer] of [employer]. ~ Low Income Psychologist, forensic. ~ Programme researcher, broadcasting/film/video. ~ Production manager. ~ 2023 Medicaid Tips. ~ Medicaid Application. Please Contact Food & Nutritional Agencies, Community education officer, in An Effort to [place]:             ~ [place] in [place], [place].             ~ The [facility] in [place], [place].             ~ The [place] in [place], [place]. Please Contact CSW Directly (# [phone]), if You Have Questions, Need Assistance, or If Additional Social Work Needs Are Identified Between Now & Our Next Scheduled Telephone [employer].      SDOH assessments and interventions completed:  Yes.  Care Coordination Interventions:  Yes, provided.   Follow up plan: Follow up call scheduled for 11/10/2022 at 9:30 am.   Encounter Outcome:  Pt. Visit Completed.   Nat Christen, BSW, MSW, [name]  Licensed Education officer, environmental Health System  Mailing [place] [address] [place], [place] [address] Physical Address-[address] [place], [place] [address] Toll Free Main # [phone] Fax # [phone] Cell # [phone] [email or web address]

## 2022-11-04 ENCOUNTER — Ambulatory Visit: Payer: Self-pay | Admitting: *Deleted

## 2022-11-04 NOTE — Patient Instructions (Addendum)
Visit Information  Thank you for taking time to visit with me today. Please don't hesitate to contact me if I can be of assistance to you.   Chronic Kidney disease (CKD) Don't take supplements containing creatine. ... Reduce your protein intake. ... Eat more fiber. ... Talk with your healthcare professional about how much fluid you should drink. ... Lower your salt intake. ... Avoid overusing NSAIDs. ... Avoid smoking. ... Limit your alcohol intake  Following are the goals we discussed today:   Goals Addressed             This Visit's Progress    THn care coordination services       Interventions Today    Flowsheet Row Most Recent Value  Chronic Disease   Chronic disease during today's visit Diabetes, Chronic Obstructive Pulmonary Disease (COPD), Hypertension (HTN), Other  [sleep disturbance, removal of unused items, social determinants of health/resources]  General Interventions   General Interventions Discussed/Reviewed General Interventions Reviewed, Labs, [facility], Doctor Visits, Communication with  Labs Hgb A1c annually  Doctor Visits Discussed/Reviewed Doctor Visits Reviewed, PCP  Communication with RN  [employer] congregational nurse for possible resource for finances]              Our next appointment is by telephone on 12/04/22 at 1pm  Please call the care guide team at [phone] if you need to cancel or reschedule your appointment.   If you are experiencing a Mental Health or Behavioral Health Crisis or need someone to talk to, please call the Suicide and Crisis Lifeline: 988 call the [place] National Suicide Prevention Lifeline: 704-653-1884 or TTY: (709) 824-9395 TTY 2496031727) to talk to a trained counselor call 1-800-273-TALK (toll free, 24 hour hotline) call the [facility]: [phone] call 911   Patient verbalizes understanding of instructions and care plan provided today and agrees to view in MyChart.  Active MyChart status and patient understanding of how to access instructions and care plan via MyChart confirmed with patient.     The patient has been provided with contact information for the care management team and has been advised to call with any health related questions or concerns.   Morton Simson L. Noelle Penner, RN, BSN, CCM [facility] Care Management Community Coordinator Office number [phone]

## 2022-11-04 NOTE — Patient Outreach (Addendum)
Care Coordination   Initial Visit Note   04/16/2023 late entry updated for 11/04/22 Name: [name] MRN: [id number] DOB: Mar 28, 1956  [name] is a 67 y.o. year old female who sees Tracy Pierini, FNP for primary care. I spoke with  [name] by phone today.  What matters to the patients health and wellness today?  Sleep disturbance took xanax since a teen use Ambien but had visual hallucination over the counter (OTC) medicines make her groggy. 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 Last one in [place] who she was comfortable with until she left. The last referred one   Grief sister died in her arms while pt was giving CPR   COPD- home oxygen use- followed by [place]/ Finally received oxygen since attempts in December 2023 while working with [employer]. Difficulty with [employer] home visits and type of equipment.  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 (battery + Art therapist also) oxygen concentrator (refurbish units) via care credits Not able to determine her triggers  Noted coughing and congestion during the outreach She reports more non productive chest congestion Using Breztri, albuterol inhaler  Completed smoke cessation  Diabetes HgA1c  10/12/22 was 6.9 average  5.9-6.9 since 2021 Reports increase in weight monthly   Chronic Kidney disease (CKD) Patient reports she was not aware of the listed diagnosis of CKD stage 3 a She voiced understanding after RN CM reviewed with her the acute kidney concerns noted during her May 2023 admission, elevations in her creatinine since that admission, the purpose of creatinine and how to help manage or reduce CKD like      Goals Addressed             This Visit's Progress    THn care coordination services        Interventions Today    Flowsheet Row Most Recent Value  Chronic Disease   Chronic disease during today's visit Diabetes, Chronic Obstructive Pulmonary Disease (COPD), Hypertension (HTN), Other  [sleep disturbance, removal of unused items, social determinants of health/resources]  General Interventions   General Interventions Discussed/Reviewed General Interventions Reviewed, Labs, [facility], Doctor Visits, Communication with  Labs Hgb A1c annually  Doctor Visits Discussed/Reviewed Doctor Visits Reviewed, PCP  Communication with RN  [employer] Engineer, drilling for possible resource for finances]              SDOH assessments and interventions completed:  Yes  SDOH Interventions Today    Flowsheet Row Most Recent Value  SDOH Interventions   Housing Interventions Other (Comment)  [connected with Child psychotherapist for community resources +]        Care Coordination Interventions:  Yes, provided   Follow up plan: Follow up call scheduled for 12/04/22    Encounter Outcome:  Pt. Visit Completed   [name] L. Noelle Penner, RN, BSN, CCM [facility] Care Management Community Coordinator Office number [phone]

## 2022-11-10 ENCOUNTER — Ambulatory Visit: Payer: Self-pay | Admitting: *Deleted

## 2022-11-10 ENCOUNTER — Encounter: Payer: Self-pay | Admitting: *Deleted

## 2022-11-10 NOTE — Patient Instructions (Signed)
Visit Information  Thank you for taking time to visit with me today. Please don't hesitate to contact me if I can be of assistance to you.   Following are the goals we discussed today:   Goals Addressed               This Visit's Progress     Find Help in My Community with Home Repairs. (pt-stated)   On track     Care Coordination Interventions:  Interventions Today    Flowsheet Row Most Recent Value  Chronic Disease   Chronic disease during today's visit Diabetes, Chronic Obstructive Pulmonary Disease (COPD), Hypertension (HTN), Chronic Kidney Disease/End Stage Renal Disease (ESRD), Other  [Morbid Obesity, Generalized Anxiety Disorder, Depression, Food Insecurity & Unsafe Housing Conditions.]  General Interventions   General Interventions Discussed/Reviewed General Interventions Discussed, General Interventions Reviewed, Annual Eye Exam, Labs, Annual Foot Exam, Durable Medical Equipment (DME), Vaccines, Health Screening, [facility], Doctor Visits, Communication with  [facility] Care Provider]  Labs Hgb A1c every 3 months, Kidney Function  Vaccines COVID-19, Flu, Pneumonia, RSV, Shingles, Tetanus/Pertussis/Diphtheria  [Encouraged]  Doctor Visits Discussed/Reviewed Doctor Visits Discussed, Doctor Visits Reviewed, Annual Wellness Visits, PCP, Specialist  [Encouraged]  Health Screening Bone Density, Colonoscopy, Mammogram  [Encouraged]  Durable Medical Equipment (DME) BP Cuff, Glucomoter, Oxygen, Walker  PCP/Specialist Visits Compliance with follow-up visit  [Encouraged]  Communication with PCP/Specialists, RN  Exercise Interventions   Exercise Discussed/Reviewed Exercise Discussed, Exercise Reviewed, Physical Activity, Assistive device use and maintanence  [Encouraged]  Physical Activity Discussed/Reviewed Physical Activity Discussed, Physical Activity Reviewed, Types of exercise, Home Exercise Program (HEP)  [Encouraged]  Education Interventions   Education Provided Provided  Therapist, sports, Provided Web-based Education, Provided Education  Provided Verbal Education On Nutrition, Foot Care, Eye Care, Blood Sugar Monitoring, Mental Health/Coping with Illness, Applications, Exercise, Medication, Insurance Plans, [facility], When to see the doctor  Mental Health Interventions   Mental Health Discussed/Reviewed Mental Health Discussed, Mental Health Reviewed, Coping Strategies, Crisis, Suicide, Substance Abuse, Grief and Loss, Depression, Anxiety  Nutrition Interventions   Nutrition Discussed/Reviewed Nutrition Discussed, Nutrition Reviewed, Carbohydrate meal planning, Fluid intake, Portion sizes, Decreasing sugar intake, Increaing proteins, Decreasing fats, Decreasing salt  [Encouraged]  Pharmacy Interventions   Pharmacy Dicussed/Reviewed Pharmacy Topics Discussed, Pharmacy Topics Reviewed, Medication Adherence, Affording Medications  [Encouraged]  Safety Interventions   Safety Discussed/Reviewed Safety Discussed, Safety Reviewed  Advanced Directive Interventions   Advanced Directives Discussed/Reviewed Advanced Directives Discussed, Advanced Directives Reviewed     Active Listening & Reflection Utilized.  Verbalization of Feelings Encouraged.  Emotional Support Provided. Caregiver Stress Acknowledged. Caregiver Resources Reviewed. Caregiver Support Groups Discussed. Self-Enrollment in Caregiver Support Group of Interest Emphasized. Problem Solving Interventions Indicated. Task-Centered Solutions Activated.  Solution-Focused Strategies Employed.  Cognitive Behavioral Therapy Performed. Acceptance & Commitment Therapy Initiated. Client-Centered Therapy Provided. CSW Collaboration with Receptionist at [facility] [phone]), Via 3-Way Call, to Assist with Scheduling Follow-Up Appointment for Chest Congestion & Migraines, with Primary Care Provider, Dr. Mechele Claude, on 11/11/2022 at 11:00 AM. Encouraged  Discussion & Referral for Psychiatric Services During Follow-Up Appointment with Primary Care Provider, Dr. Mechele Claude with [facility] [phone]), Scheduled on 11/11/2022 at 11:00 AM.  Please Consider Receiving Psychiatric Services for Psychotropic Medication Management & Therapist for Psychotherapeutic Counseling & Supportive Services, through [facility] at [place] [phone]).  Please Continue to [employer], Services & Resources of Interest, in An Effort to [employer] Assistance: ~  [place] [employer] ~ Resources for Seniors in [place], [place] ~ [employer] of [employer] ~ Low Income Chief Technology Officer ~ Engineer, drilling ~ TEFL teacher ~ 2023 Medicaid Tips ~ [employer] Application Please Contact Food & Nutritional Agencies, Wellsite geologist of Interest, in An Effort to [employer] & Nutritional Assistance: ~ Our Education administrator - (# [phone]) ~ [place] [employer] - (# 504-613-8697) ~ Out of the [employer] - (# [phone]) ~ [employer] - United Way Of [facility] [place] [employer] - (225) 010-9584) Please Contact CSW Directly (# [phone]), if You Have Questions, Need Assistance, or If Additional Social Work Needs Are Identified Between Now & Our Next Scheduled Telephone [employer].      Our next appointment is by telephone on 11/24/2022 at 10:00 am.  Please call the care guide team at [phone] if you need to cancel or reschedule your appointment.   If you are experiencing a Mental Health or Behavioral Health Crisis or need someone to talk to, please call the Suicide and Crisis Lifeline: 988 call the [place] National Suicide Prevention Lifeline: 865-737-8114 or TTY: 919-622-3480 TTY (219) 055-0371) to talk to a trained counselor call 1-800-273-TALK  (toll free, 24 hour hotline) go to [facility] [address], [place] [phone]) call the [facility] Crisis Line: 3065869205 call 911  Patient verbalizes understanding of instructions and care plan provided today and agrees to view in MyChart. Active MyChart status and patient understanding of how to access instructions and care plan via MyChart confirmed with patient.     Telephone follow up appointment with care management team member scheduled for:  11/24/2022 at 10:00 am.  Danford Bad, BSW, MSW, [name]  Licensed Clinical Social Worker  [employer] [facility]  Mailing [place]. [address] [place], [place] [address] Physical Address-[address] [place], [place] [address] Toll Free Main # [phone] Fax # [phone] Cell # [phone] [email or web address]

## 2022-11-10 NOTE — Patient Outreach (Signed)
[name] Coordination   Follow Up Visit Note   11/10/2022  Name: [name] MRN: [id number] DOB: 04/17/1956  [name] is a 67 y.o. year old female who sees Tracy Pierini, FNP for primary [name]. I spoke with [name] by phone today.  What matters to the patients health and wellness today?  Find Help in My Community with Home Repairs.   Goals Addressed               This Visit's Progress     Find Help in My Community with Home Repairs. (pt-stated)   On track     [name] Coordination Interventions:  Interventions Today    Flowsheet Row Most Recent Value  Chronic Disease   Chronic disease during today's visit Diabetes, Chronic Obstructive Pulmonary Disease (COPD), Hypertension (HTN), Chronic Kidney Disease/End Stage Renal Disease (ESRD), Other  [Morbid Obesity, Generalized Anxiety Disorder, Depression, Food Insecurity & Unsafe Housing Conditions.]  General Interventions   General Interventions Discussed/Reviewed General Interventions Discussed, General Interventions Reviewed, Annual Eye Exam, Labs, Annual Foot Exam, Durable Medical Equipment (DME), Vaccines, Health Screening, [facility], Doctor Visits, Communication with  [facility] [name] [name] Provider]  Labs Hgb A1c every 3 months, Kidney Function  Vaccines COVID-19, Flu, Pneumonia, RSV, Shingles, Tetanus/Pertussis/Diphtheria  [Encouraged]  Doctor Visits Discussed/Reviewed Doctor Visits Discussed, Doctor Visits Reviewed, Annual Wellness Visits, PCP, Specialist  [Encouraged]  Health Screening Bone Density, Colonoscopy, Mammogram  [Encouraged]  Durable Medical Equipment (DME) BP Cuff, Glucomoter, Oxygen, Walker  PCP/Specialist Visits Compliance with follow-up visit  [Encouraged]  Communication with PCP/Specialists, RN  Exercise Interventions   Exercise Discussed/Reviewed Exercise Discussed, Exercise Reviewed, Physical Activity, Assistive device use and maintanence  [Encouraged]  Physical Activity  Discussed/Reviewed Physical Activity Discussed, Physical Activity Reviewed, Types of exercise, Home Exercise Program (HEP)  [Encouraged]  Education Interventions   Education Provided Provided Therapist, sports, Provided Web-based Education, Provided Education  Provided Verbal Education On Nutrition, Foot [name], Eye [name], Blood Sugar Monitoring, Mental Health/Coping with Illness, Applications, Exercise, Medication, Insurance Plans, [facility], When to see the doctor  Mental Health Interventions   Mental Health Discussed/Reviewed Mental Health Discussed, Mental Health Reviewed, Coping Strategies, Crisis, Suicide, Substance Abuse, Grief and Loss, Depression, Anxiety  Nutrition Interventions   Nutrition Discussed/Reviewed Nutrition Discussed, Nutrition Reviewed, Carbohydrate meal planning, Fluid intake, Portion sizes, Decreasing sugar intake, Increaing proteins, Decreasing fats, Decreasing salt  [Encouraged]  Pharmacy Interventions   Pharmacy Dicussed/Reviewed Pharmacy Topics Discussed, Pharmacy Topics Reviewed, Medication Adherence, Affording Medications  [Encouraged]  Safety Interventions   Safety Discussed/Reviewed Safety Discussed, Safety Reviewed  Advanced Directive Interventions   Advanced Directives Discussed/Reviewed Advanced Directives Discussed, Advanced Directives Reviewed     Active Listening & Reflection Utilized.  Verbalization of Feelings Encouraged.  Emotional Support Provided. Caregiver Stress Acknowledged. Caregiver Resources Reviewed. Caregiver Support Groups Discussed. Self-Enrollment in Caregiver Support Group of Interest Emphasized. Problem Solving Interventions Indicated. Task-Centered Solutions Activated.  Solution-Focused Strategies Employed.  Cognitive Behavioral Therapy Performed. Acceptance & Commitment Therapy Initiated. Client-Centered Therapy Provided. CSW Collaboration with Receptionist at [facility] [phone]), Via 3-Way Call, to Assist with Scheduling Follow-Up Appointment for Chest Congestion & Migraines, with Primary [name] Provider, Dr. Mechele Claude, on 11/11/2022 at 11:00 AM. Encouraged Discussion & Referral for Psychiatric Services During Follow-Up Appointment with Primary [name] Provider, Dr. Mechele Claude with [facility] [phone]), Scheduled on 11/11/2022 at 11:00 AM.  Please Consider Receiving Psychiatric Services for Psychotropic Medication Management & Therapist for Psychotherapeutic Counseling &  Supportive Services, through [facility] at [place] [phone]).  Please Continue to Therapist, sports, Services & Resources of Interest, in An Effort to [employer] Financial Assistance: ~ [employer] ~ Resources for Seniors in [place], [place] ~ [employer] of [employer] ~ Low Income Chief Technology Officer ~ Engineer, drilling ~ TEFL teacher ~ 2023 Medicaid Tips ~ [employer] Application Please Contact Food & [employer] Corporation, Wellsite geologist of Interest, in An Effort to W. [employer] & Nutritional Assistance: ~ Our Education administrator - (# [phone]) ~ [place] [employer] - (# [phone]) ~ Out of the [employer] - (# [phone]) ~ [employer] - United Way Of [facility] [place] [employer] - [phone]) Please Contact CSW Directly (# [phone]), if You Have Questions, Need Assistance, or If Additional Social Work Needs Are Identified Between Now & Our Next Scheduled Telephone [employer].      SDOH assessments and interventions completed:  Yes.  [name] Coordination Interventions:  Yes, provided.   Follow up plan: Follow up call scheduled for 11/24/2022 at 10:00 am.  Encounter Outcome:  Pt. Visit Completed.   [name], BSW, MSW, [name]  Licensed Optician, dispensing Health System  Mailing [place] [address] [place], [place] [address] Physical Address-[address] [place], [place] [address] Toll Free Main # [phone] Fax # [phone] Cell # [phone] [name]

## 2022-11-11 ENCOUNTER — Other Ambulatory Visit: Payer: Self-pay | Admitting: Family Medicine

## 2022-11-11 ENCOUNTER — Ambulatory Visit (INDEPENDENT_AMBULATORY_CARE_PROVIDER_SITE_OTHER): Payer: Medicare HMO | Admitting: Family Medicine

## 2022-11-11 ENCOUNTER — Encounter: Payer: Self-pay | Admitting: Family Medicine

## 2022-11-11 ENCOUNTER — Telehealth: Payer: Self-pay | Admitting: Family Medicine

## 2022-11-11 VITALS — BP 140/59 | HR 65 | Temp 97.9°F | Ht 66.0 in | Wt 217.6 lb

## 2022-11-11 DIAGNOSIS — J441 Chronic obstructive pulmonary disease with (acute) exacerbation: Secondary | ICD-10-CM | POA: Diagnosis not present

## 2022-11-11 MED ORDER — LEVOFLOXACIN 500 MG PO TABS: 500.0000 mg | ORAL_TABLET | Freq: Every day | ORAL | 0 refills | Status: DC

## 2022-11-11 MED ORDER — HYDROCODONE BIT-HOMATROP MBR 5-1.5 MG/5ML PO SOLN: 5.0000 mL | Freq: Four times a day (QID) | ORAL | 0 refills | Status: AC | PRN

## 2022-11-11 MED ORDER — MOXIFLOXACIN HCL 400 MG PO TABS: 400.0000 mg | ORAL_TABLET | Freq: Every day | ORAL | 0 refills | Status: DC

## 2022-11-11 MED ORDER — BETAMETHASONE SOD PHOS & ACET 6 (3-3) MG/ML IJ SUSP
6.0000 mg | Freq: Once | INTRAMUSCULAR | Status: AC
  Administered 2022-11-11: 6 mg via INTRAMUSCULAR

## 2022-11-11 NOTE — Telephone Encounter (Signed)
PATIENT AWARE

## 2022-11-11 NOTE — Progress Notes (Signed)
Chief Complaint  Patient presents with   CHEST CONGESTION     HPI  Patient presents today for Patient presents  respiratory congestion. Rhinorrhea that is occasional. No earache. There is a little sore throat. Patient reports coughing frequently as well.  No sputum noted. There is no fever, chills or sweats. The patient is short of breath. Onset was 7-10 days ago. Gradually worsening. Tried OTCs without improvement. hAD A Z- PACK A MONTH AGO. heLPED A LITTLE . Chest feels full. Cough feels wet, but nonproductive. Dyspnea. Smoker. Quit one year ago.   PMH: Smoking status noted ROS: Per HPI  Objective: BP (!) 140/59   Pulse 65   Temp 97.9 F (36.6 C)   Ht 5\' 6"  (1.676 m)   Wt 217 lb 9.6 oz (98.7 kg)   LMP 10/17/2005   SpO2 93%   BMI 35.12 kg/m  Gen: NAD, alert, cooperative with exam HEENT: NCAT, Nasal passages swollen, Tms clear. CV: RRR, good S1/S2, no murmur Resp: Bronchitis changes with scattered wheezes, non-labored Ext: No edema, warm Neuro: Alert and oriented, No gross deficits  Assessment and plan:  1. Chronic obstructive pulmonary disease with acute exacerbation     Meds ordered this encounter  Medications   moxifloxacin (AVELOX) 400 MG tablet    Sig: Take 1 tablet (400 mg total) by mouth daily.    Dispense:  10 tablet    Refill:  0   HYDROcodone bit-homatropine (HYCODAN) 5-1.5 MG/5ML syrup    Sig: Take 5 mLs by mouth every 6 (six) hours as needed for up to 5 days for cough.    Dispense:  100 mL    Refill:  0   betamethasone acetate-betamethasone sodium phosphate (CELESTONE) injection 6 mg    No orders of the defined types were placed in this encounter.   Follow up as needed.  Mechele Claude, MD

## 2022-11-11 NOTE — Telephone Encounter (Signed)
I sent a new antibiotic in. If she can't afford the cough med she should just get OTC Delsym.

## 2022-11-11 NOTE — Telephone Encounter (Signed)
Pt called to let Dr Darlyn Read know that insurance wont cover the medications he sent in for her today and needs something cheaper or something that insurance will cover to be sent in for her.

## 2022-11-18 ENCOUNTER — Ambulatory Visit (AMBULATORY_SURGERY_CENTER): Payer: Medicare HMO | Admitting: *Deleted

## 2022-11-18 VITALS — Ht 66.0 in | Wt 217.0 lb

## 2022-11-18 DIAGNOSIS — Z85038 Personal history of other malignant neoplasm of large intestine: Secondary | ICD-10-CM

## 2022-11-18 DIAGNOSIS — Z86010 Personal history of colonic polyps: Secondary | ICD-10-CM

## 2022-11-18 DIAGNOSIS — R195 Other fecal abnormalities: Secondary | ICD-10-CM

## 2022-11-18 MED ORDER — NA SULFATE-K SULFATE-MG SULF 17.5-3.13-1.6 GM/177ML PO SOLN: 1.0000 | Freq: Once | ORAL | 0 refills | Status: AC

## 2022-11-18 NOTE — Progress Notes (Signed)
Pt's name and DOB verified at the beginning of the pre-visit.  Pt denies any difficulty with ambulating.,sitting or turning side to side No egg or soy allergy known to patient  No issues known to pt with past sedation with any surgeries or procedures Patient denies ever being intubated Pt has no issues moving head neck or swallowing No FH of Malignant Hyperthermia Pt is not on diet pills Pt is not on home 02  Pt is not on blood thinners  Pt denies issues with constipation  Pt is not on dialysis Pt denies any upcoming cardiac testing Pt encouraged to use to use Singlecare or Goodrx to reduce cost  Patient's chart reviewed by Cathlyn Parsons CNRA prior to pre-visit and patient appropriate for the [facility].  Pre-visit completed and red dot placed by patient's name on their procedure day (on provider's schedule).  . Visit by phone Pt states weight is 217 lb Instructed pt why it is important to and  to call if they have any changes in health or new medications. Directed them to the # given and on instructions.   Pt states they will.  Instructions reviewed with pt and pt states understanding. Instructed to review again prior to procedure. Pt states they will.  Instructions sent by mail with coupon and by my chart Pt uses O2 aty hoe intermittently. Had [facility] OV 08/03/22

## 2022-11-24 ENCOUNTER — Ambulatory Visit: Payer: Self-pay | Admitting: *Deleted

## 2022-11-24 ENCOUNTER — Encounter: Payer: Self-pay | Admitting: *Deleted

## 2022-11-24 NOTE — Patient Instructions (Signed)
Visit Information  Thank you for taking time to visit with me today. Please don't hesitate to contact me if I can be of assistance to you.   Following are the goals we discussed today:   Goals Addressed               This Visit's Progress     COMPLETED: Find Help in My Community with Home Repairs. (pt-stated)   On track     Care Coordination Interventions:  Interventions Today    Flowsheet Row Most Recent Value  Chronic Disease   Chronic disease during today's visit Diabetes, Chronic Obstructive Pulmonary Disease (COPD), Hypertension (HTN), Chronic Kidney Disease/End Stage Renal Disease (ESRD), Other  [Morbid Obesity, Generalized Anxiety Disorder, Depression, Food Insecurity & Unsafe Housing Conditions.]  General Interventions   General Interventions Discussed/Reviewed General Interventions Discussed, General Interventions Reviewed, Annual Eye Exam, Labs, Annual Foot Exam, Durable Medical Equipment (DME), Vaccines, Health Screening, [employer], Doctor Visits, Communication with  [facility] Care Provider]  Labs Hgb A1c every 3 months, Kidney Function  Vaccines COVID-19, Flu, Pneumonia, RSV, Shingles, Tetanus/Pertussis/Diphtheria  [Encouraged]  Doctor Visits Discussed/Reviewed Doctor Visits Discussed, Doctor Visits Reviewed, Annual Wellness Visits, PCP, Specialist  [Encouraged]  Health Screening Bone Density, Colonoscopy, Mammogram  [Encouraged]  Durable Medical Equipment (DME) BP Cuff, Glucomoter, Oxygen, Walker  PCP/Specialist Visits Compliance with follow-up visit  [Encouraged]  Communication with PCP/Specialists, RN  Exercise Interventions   Exercise Discussed/Reviewed Exercise Discussed, Exercise Reviewed, Physical Activity, Assistive device use and maintanence  [Encouraged]  Physical Activity Discussed/Reviewed Physical Activity Discussed, Physical Activity Reviewed, Types of exercise, Home Exercise Program (HEP)  [Encouraged]  Education Interventions   Education Provided  Provided Therapist, sports, Provided Web-based Education, Provided Education  Provided Verbal Education On Nutrition, Foot Care, Eye Care, Blood Sugar Monitoring, Mental Health/Coping with Illness, Applications, Exercise, Medication, Insurance Plans, [employer], When to see the doctor  Mental Health Interventions   Mental Health Discussed/Reviewed Mental Health Discussed, Mental Health Reviewed, Coping Strategies, Crisis, Suicide, Substance Abuse, Grief and Loss, Depression, Anxiety  Nutrition Interventions   Nutrition Discussed/Reviewed Nutrition Discussed, Nutrition Reviewed, Carbohydrate meal planning, Fluid intake, Portion sizes, Decreasing sugar intake, Increaing proteins, Decreasing fats, Decreasing salt  [Encouraged]  Pharmacy Interventions   Pharmacy Dicussed/Reviewed Pharmacy Topics Discussed, Pharmacy Topics Reviewed, Medication Adherence, Affording Medications  [Encouraged]  Safety Interventions   Safety Discussed/Reviewed Safety Discussed, Safety Reviewed  Advanced Directive Interventions   Advanced Directives Discussed/Reviewed Advanced Directives Discussed, Advanced Directives Reviewed     Active Listening & Reflection Utilized.  Verbalization of Feelings Encouraged.  Emotional Support Provided. Caregiver Stress Acknowledged. Caregiver Resources Reviewed. Caregiver Support Groups Discussed. Self-Enrollment in Caregiver Support Group of Interest Emphasized. Problem Solving Interventions Indicated. Task-Centered Solutions Activated.  Solution-Focused Strategies Employed.  Cognitive Behavioral Therapy Performed. Acceptance & Commitment Therapy Initiated. Client-Centered Therapy Provided. Please Continue to Consider Receiving Psychiatric Services for Psychotropic Medication Management & Therapist for Psychotherapeutic Counseling & Supportive Services, through [facility] at [place] [phone]).  Please Contact CSW Directly (# [phone]),  if You Have Questions, Need Assistance, or If Additional Social Work Needs Are Identified in The Near Future.      Please call the care guide team at [phone] if you need to cancel or reschedule your appointment.   If you are experiencing a Mental Health or Behavioral Health Crisis or need someone to talk to, please call the Suicide and Crisis Lifeline: 988 call the [place] National Suicide Prevention Lifeline: (832) 046-0454 or TTY: 8544923795 TTY (250)827-5779) to  talk to a trained counselor call 1-800-273-TALK (toll free, 24 hour hotline) go to [facility] Urgent [facility] [address] [place] [phone]) call the [facility] Crisis Line: [phone] call 911  Patient verbalizes understanding of instructions and care plan provided today and agrees to view in MyChart. Active MyChart status and patient understanding of how to access instructions and care plan via MyChart confirmed with patient.     No further follow up required.   Danford Bad, BSW, MSW, [name]  Licensed Restaurant manager, fast food [facility]  Mailing [place] [address] [place], [place] [address] Physical Address-[address], [place], [place] [address] Toll Free Main # [phone] Fax # [phone] Cell # [phone] [email or web address]

## 2022-11-24 NOTE — Patient Outreach (Signed)
Care Coordination   Follow Up Visit Note   11/24/2022  Name: [name] MRN: [id number] DOB: [birth year]  [name] is a 67 y.o. year old female who sees Bennie Pierini, FNP for primary care. I spoke with [name] by phone today.  What matters to the patients health and wellness today?  Find Help in My Community with Home Repairs.    Goals Addressed               This Visit's Progress     COMPLETED: Find Help in My Community with Home Repairs. (pt-stated)   On track     Care Coordination Interventions:  Interventions Today    Flowsheet Row Most Recent Value  Chronic Disease   Chronic disease during today's visit Diabetes, Chronic Obstructive Pulmonary Disease (COPD), Hypertension (HTN), Chronic Kidney Disease/End Stage Renal Disease (ESRD), Other  [Morbid Obesity, Generalized Anxiety Disorder, Depression, Food Insecurity & Unsafe Housing Conditions.]  General Interventions   General Interventions Discussed/Reviewed General Interventions Discussed, General Interventions Reviewed, Annual Eye Exam, Labs, Annual Foot Exam, Durable Medical Equipment (DME), Vaccines, Health Screening, [employer], Doctor Visits, Communication with  [facility] Care Provider]  Labs Hgb A1c every 3 months, Kidney Function  Vaccines COVID-19, Flu, Pneumonia, RSV, Shingles, Tetanus/Pertussis/Diphtheria  [Encouraged]  Doctor Visits Discussed/Reviewed Doctor Visits Discussed, Doctor Visits Reviewed, Annual Wellness Visits, PCP, Specialist  [Encouraged]  Health Screening Bone Density, Colonoscopy, Mammogram  [Encouraged]  Durable Medical Equipment (DME) BP Cuff, Glucomoter, Oxygen, Walker  PCP/Specialist Visits Compliance with follow-up visit  [Encouraged]  Communication with PCP/Specialists, RN  Exercise Interventions   Exercise Discussed/Reviewed Exercise Discussed, Exercise Reviewed, Physical Activity, Assistive device use and maintanence  [Encouraged]  Physical Activity  Discussed/Reviewed Physical Activity Discussed, Physical Activity Reviewed, Types of exercise, Home Exercise Program (HEP)  [Encouraged]  Education Interventions   Education Provided Provided Therapist, sports, Provided Web-based Education, Provided Education  Provided Verbal Education On Nutrition, Foot Care, Eye Care, Blood Sugar Monitoring, Mental Health/Coping with Illness, Applications, Exercise, Medication, Insurance Plans, [employer], When to see the doctor  Mental Health Interventions   Mental Health Discussed/Reviewed Mental Health Discussed, Mental Health Reviewed, Coping Strategies, Crisis, Suicide, Substance Abuse, Grief and Loss, Depression, Anxiety  Nutrition Interventions   Nutrition Discussed/Reviewed Nutrition Discussed, Nutrition Reviewed, Carbohydrate meal planning, Fluid intake, Portion sizes, Decreasing sugar intake, Increaing proteins, Decreasing fats, Decreasing salt  [Encouraged]  Pharmacy Interventions   Pharmacy Dicussed/Reviewed Pharmacy Topics Discussed, Pharmacy Topics Reviewed, Medication Adherence, Affording Medications  [Encouraged]  Safety Interventions   Safety Discussed/Reviewed Safety Discussed, Safety Reviewed  Advanced Directive Interventions   Advanced Directives Discussed/Reviewed Advanced Directives Discussed, Advanced Directives Reviewed     Active Listening & Reflection Utilized.  Verbalization of Feelings Encouraged.  Emotional Support Provided. Caregiver Stress Acknowledged. Caregiver Resources Reviewed. Caregiver Support Groups Discussed. Self-Enrollment in Caregiver Support Group of Interest Emphasized. Problem Solving Interventions Indicated. Task-Centered Solutions Activated.  Solution-Focused Strategies Employed.  Cognitive Behavioral Therapy Performed. Acceptance & Commitment Therapy Initiated. Client-Centered Therapy Provided. Please Continue to Consider Receiving Psychiatric Services for Psychotropic Medication Management &  Therapist for Psychotherapeutic Counseling & Supportive Services, through [facility] at [place] [phone]).  Please Contact CSW Directly (# [phone]), if You Have Questions, Need Assistance, or If Additional Social Work Needs Are Identified in The Near Future.      SDOH assessments and interventions completed:  Yes.  Care Coordination Interventions:  Yes, provided.   Follow up plan: No further intervention required.   Encounter  Outcome:  Pt. Visit Completed.   Danford Bad, BSW, MSW, [name]  Licensed Restaurant manager, fast food [facility]  Mailing [place] [address], [place], [place] [address] Physical Address[address] [place], [place] [address] Toll Free Main # [phone] Fax # [phone] Cell # [phone] [email or web address]

## 2022-12-02 ENCOUNTER — Encounter: Payer: Self-pay | Admitting: Internal Medicine

## 2022-12-04 ENCOUNTER — Ambulatory Visit: Payer: Self-pay | Admitting: *Deleted

## 2022-12-04 NOTE — Patient Outreach (Signed)
  Care Coordination   Follow Up Visit Note   12/04/2022 Name: [name] MRN: [id number] DOB: 1956/07/05  [name] is a 67 y.o. year old female who sees Bennie Pierini, FNP for primary care. I spoke with  [name] by phone today.  What matters to the patients health and wellness today?  Still sleep disturbance continues but she tries to get sleep during the day when unable to at night   COPD She reports her breathing ok and she is not having to use her oxygen as much   Diabetes/hypertension- She reports she has not been taking her vitals as often as she should related to caring for her grandchildren to help out her daughter & is keeping her busy lately    Home improvement/maintenance Anticipating obtaining a dumpster when she gets extra money at a cost of $200-500 She is aware of a company close to her that confirmed Dumpsters can be purchased for $325-$350 with 10 days to fill up and return it She reports her daughter and significant other can assist if dumpster is obtained    Received information for [facility] Has food bank information but has not utilize the resource She confirms she goes to [employer] on [address] but is not aware of any congregational services that may help Agrees for RN CM to consult Medical laboratory scientific officer program     Goals Addressed             This Visit's Progress    THn care coordination services   On track    Interventions Today    Flowsheet Row Most Recent Value  Chronic Disease   Chronic disease during today's visit Diabetes, Chronic Obstructive Pulmonary Disease (COPD), Hypertension (HTN), Other  [sleep disturbance, removal of unused items, social determinants of health/resources]  General Interventions   General Interventions Discussed/Reviewed General Interventions Reviewed, Labs, [employer], Doctor Visits, Communication with  Labs Hgb A1c annually  Doctor Visits  Discussed/Reviewed Doctor Visits Reviewed, PCP  Communication with RN  [employer] Engineer, drilling for possible resource for finances]              SDOH assessments and interventions completed:  Yes  SDOH Interventions Today    Flowsheet Row Most Recent Value  SDOH Interventions   Food Insecurity Interventions Other (Comment)  [active with [facility] SW Has received resources but have not access at this time]  Transportation Interventions Intervention Not Indicated        Care Coordination Interventions:  Yes, provided   Follow up plan: Follow up call scheduled for 01/05/23    Encounter Outcome:  Pt. Visit Completed   Nilton Lave L. Noelle Penner, RN, BSN, CCM [facility] Care Management Community Coordinator Office number [phone]

## 2022-12-04 NOTE — Patient Instructions (Addendum)
Visit Information  Thank you for taking time to visit with me today. Please don't hesitate to contact me if I can be of assistance to you.   Following are the goals we discussed today:   Goals Addressed             This Visit's Progress    THn care coordination services   On track    Interventions Today    Flowsheet Row Most Recent Value  Chronic Disease   Chronic disease during today's visit Diabetes, Chronic Obstructive Pulmonary Disease (COPD), Hypertension (HTN), Other  [sleep disturbance, removal of unused items, social determinants of health/resources]  General Interventions   General Interventions Discussed/Reviewed General Interventions Reviewed, Labs, [employer], Doctor Visits, Communication with  Labs Hgb A1c annually  Doctor Visits Discussed/Reviewed Doctor Visits Reviewed, PCP  Communication with RN  [employer] congregational nurse for possible resource for finances]              Our next appointment is by telephone on 01/05/23 at  1 pm  Please call the care guide team at [phone] if you need to cancel or reschedule your appointment.   If you are experiencing a Mental Health or Behavioral Health Crisis or need someone to talk to, please call the Suicide and Crisis Lifeline: 988 call the [place] National Suicide Prevention Lifeline: (726)857-0375 or TTY: 386-289-2054 TTY 8504401602) to talk to a trained counselor call 1-800-273-TALK (toll free, 24 hour hotline) call the [facility]: [phone] call 911   Patient verbalizes understanding of instructions and care plan provided today and agrees to view in MyChart. Active MyChart status and patient understanding of how to access instructions and care plan via MyChart confirmed with patient.     The patient has been provided with contact information for the care management team and has been advised to call with any health related questions or concerns.   [name] L.  Noelle Penner, RN, BSN, CCM [facility] Care Management Community Coordinator Office number [phone]

## 2022-12-08 ENCOUNTER — Encounter: Payer: Self-pay | Admitting: Internal Medicine

## 2022-12-14 ENCOUNTER — Other Ambulatory Visit: Payer: Self-pay | Admitting: Internal Medicine

## 2022-12-15 ENCOUNTER — Ambulatory Visit (AMBULATORY_SURGERY_CENTER): Payer: Medicare HMO | Admitting: Internal Medicine

## 2022-12-15 ENCOUNTER — Encounter: Payer: Self-pay | Admitting: Internal Medicine

## 2022-12-15 VITALS — BP 117/78 | HR 64 | Temp 97.8°F | Resp 14 | Ht 66.0 in | Wt 217.0 lb

## 2022-12-15 DIAGNOSIS — Z86010 Personal history of colonic polyps: Secondary | ICD-10-CM | POA: Diagnosis not present

## 2022-12-15 DIAGNOSIS — E119 Type 2 diabetes mellitus without complications: Secondary | ICD-10-CM | POA: Diagnosis not present

## 2022-12-15 DIAGNOSIS — D123 Benign neoplasm of transverse colon: Secondary | ICD-10-CM

## 2022-12-15 DIAGNOSIS — K635 Polyp of colon: Secondary | ICD-10-CM | POA: Diagnosis not present

## 2022-12-15 DIAGNOSIS — I10 Essential (primary) hypertension: Secondary | ICD-10-CM | POA: Diagnosis not present

## 2022-12-15 DIAGNOSIS — D122 Benign neoplasm of ascending colon: Secondary | ICD-10-CM | POA: Diagnosis not present

## 2022-12-15 DIAGNOSIS — Z09 Encounter for follow-up examination after completed treatment for conditions other than malignant neoplasm: Secondary | ICD-10-CM

## 2022-12-15 DIAGNOSIS — D124 Benign neoplasm of descending colon: Secondary | ICD-10-CM

## 2022-12-15 DIAGNOSIS — R195 Other fecal abnormalities: Secondary | ICD-10-CM

## 2022-12-15 DIAGNOSIS — D125 Benign neoplasm of sigmoid colon: Secondary | ICD-10-CM

## 2022-12-15 MED ORDER — SODIUM CHLORIDE 0.9 % IV SOLN
500.0000 mL | INTRAVENOUS | Status: DC
Start: 2022-12-15 — End: 2022-12-15

## 2022-12-15 NOTE — Progress Notes (Signed)
Called to room to assist during endoscopic procedure.  Patient ID and intended procedure confirmed with present staff. Received instructions for my participation in the procedure from the performing physician.  

## 2022-12-15 NOTE — Progress Notes (Signed)
GASTROENTEROLOGY PROCEDURE H&P NOTE   Primary Care Physician: Bennie Pierini, FNP    Reason for Procedure:   Positive Cologuard, history of colon polyps  Plan:    Colonoscopy  Patient is appropriate for endoscopic procedure(s) in the ambulatory ([facility]) setting.  The nature of the procedure, as well as the risks, benefits, and alternatives were carefully and thoroughly reviewed with the patient. Ample time for discussion and questions allowed. The patient understood, was satisfied, and agreed to proceed.     HPI: [name] is a 67 y.o. female who presents for colonoscopy for evaluation of positive cologuard and history of colon polyps .  Patient was most recently seen in the Gastroenterology Clinic on 08/03/22.  No interval change in medical history since that appointment. Please refer to that note for full details regarding GI history and clinical presentation.   Past Medical History:  Diagnosis Date   Anxiety    Breast cancer (HCC) 06/30/2011   inv ductal, ER/PR +, her-2 -   CAD (coronary artery disease)    NSTEMI 4/08: OM3 occluded (PCI unsuccessful), dRCA 95% => BMS, inf HK, EF 50%;   b. MV 11/12:  IL ischemia => c.  LHC 11/12:  dLAD 70%, OM1 70-80%, then occluded (no change from 2008), dOM filled L->L collats, mRCA stent occluded, dRCA filled L->R collats, EF 55-65% => med Rx (consider PCI of RCA if refractory angina)   Carotid stenosis    dopplers 5/08: 0-30% bilateral; 10/12: 0-39% B/L ICA => f/u 04/2013   Cataract 2023   both eye   Chronic kidney disease    COPD (chronic obstructive pulmonary disease) (HCC)    Depression    Depression with anxiety    Diabetes mellitus without complication (HCC)    Emphysema of lung (HCC)    GERD (gastroesophageal reflux disease)    OTC acid reducer prn   Headache(784.0)    sinus; occ. migraines   Hx of radiation therapy 08/24/11 to 10/07/11   L breast   Hyperlipidemia    Hypertension    under control; has been on med. x  4 yrs.   Myocardial infarction [facility])    Oxygen deficiency    Intermittant   Stable angina    a. med Rx after cath 05/2011   Stress incontinence, female     Past Surgical History:  Procedure Laterality Date   AXILLARY LYMPH NODE DISSECTION  07/31/2011   Procedure: AXILLARY LYMPH NODE DISSECTION;  Surgeon: [name], MD;  Location: [facility];  Service: General;  Laterality: Left;  left axillary sentinal node biopsy   BREAST LUMPECTOMY Left 06/30/2011   BREAST SURGERY     CARDIAC CATHETERIZATION  11/02/2006; 06/03/2011   CATARACT EXTRACTION Bilateral    CESAREAN SECTION  1987   CORONARY STENT PLACEMENT  11/02/2006   ELBOW SURGERY     right   LEFT HEART CATH AND CORONARY ANGIOGRAPHY N/A 10/07/2020   Procedure: LEFT HEART CATH AND CORONARY ANGIOGRAPHY;  Surgeon: [name], MD;  Location: [facility] INVASIVE CV LAB;  Service: Cardiovascular;  Laterality: N/A;    Prior to Admission medications   Medication Sig Start Date End Date Taking? Authorizing Provider  albuterol (VENTOLIN HFA) 108 (90 Base) MCG/ACT inhaler Inhale 2 puffs into the lungs every 6 (six) hours as needed for wheezing or shortness of breath. 12/17/21   Medina-Vargas, Monina C, NP  Alcohol Swabs (B-D SINGLE USE SWABS REGULAR) PADS check blood sugars twice daily Dx E11.9 04/20/22  [name], [name], FNP  aspirin EC 81 MG tablet Take 1 tablet (81 mg total) by mouth daily. Swallow whole. 12/17/21   Medina-Vargas, Monina C, NP  atorvastatin (LIPITOR) 80 MG tablet Take 1 tablet (80 mg total) by mouth at bedtime. 10/12/22   [name] [name], FNP  Biotin 2.5 MG CAPS Take by mouth.    [provider]  BREZTRI AEROSPHERE 160-9-4.8 MCG/ACT AERO INHALE 2 PUFFS INTO THE LUNGS IN THE MORNING AND AT BEDTIME. 10/16/22   [name], [name], FNP  Cholecalciferol (VITAMIN D3) 5000 units TABS Take 10,000 Units by mouth every evening.    [provider]  cloNIDine (CATAPRES) 0.2 MG tablet Take 1  tablet (0.2 mg total) by mouth 2 (two) times daily. 10/12/22   [name], [name], FNP  escitalopram (LEXAPRO) 20 MG tablet Take 1 tablet (20 mg total) by mouth daily. 10/12/22   [name] [name], FNP  fish oil-omega-3 fatty acids 1000 MG capsule Take 1,000 mg by mouth at bedtime.    [provider]  fluticasone furoate-vilanterol (BREO ELLIPTA) 100-25 MCG/ACT AEPB Inhale into the lungs. Patient not taking: Reported on 11/18/2022 03/10/17   [provider]  gabapentin (NEURONTIN) 100 MG capsule Take 1 capsule (100 mg total) by mouth at bedtime. 08/03/22   [name], [name], FNP  levofloxacin (LEVAQUIN) 500 MG tablet Take 1 tablet (500 mg total) by mouth daily. 11/11/22   Mechele Claude, MD  metFORMIN (GLUCOPHAGE) 500 MG tablet Take 1 tablet (500 mg total) by mouth 2 (two) times daily with a meal. 10/12/22   [name], [name], FNP  metoprolol tartrate (LOPRESSOR) 25 MG tablet Take 1 tablet (25 mg total) by mouth 2 (two) times daily. 10/12/22   [name], [name], FNP  pramipexole (MIRAPEX) 1 MG tablet Take 1 tablet (1 mg total) by mouth in the morning and at bedtime. 10/12/22   Bennie Pierini, FNP  Sodium Sulfate-Mag Sulfate-KCl (SUTAB) [id number] MG TABS Use as directed Patient not taking: Reported on 11/18/2022 08/03/22   Imogene Burn, MD  vitamin B-12 (CYANOCOBALAMIN) 1000 MCG tablet Take 1 tablet (1,000 mcg total) by mouth daily. 12/17/21   Medina-Vargas, Monina C, NP  zolpidem (AMBIEN) 10 MG tablet Take 1 tablet (10 mg total) by mouth at bedtime as needed for sleep. Patient not taking: Reported on 11/11/2022 10/12/22 11/11/22  Bennie Pierini, FNP    Current Outpatient Medications  Medication Sig Dispense Refill   albuterol (VENTOLIN HFA) 108 (90 Base) MCG/ACT inhaler Inhale 2 puffs into the lungs every 6 (six) hours as needed for wheezing or shortness of breath. 6.7 g 0   Alcohol Swabs (B-D SINGLE USE SWABS REGULAR) PADS check blood sugars twice daily Dx  E11.9 200 each 0   aspirin EC 81 MG tablet Take 1 tablet (81 mg total) by mouth daily. Swallow whole. 30 tablet 0   atorvastatin (LIPITOR) 80 MG tablet Take 1 tablet (80 mg total) by mouth at bedtime. 90 tablet 1   Biotin 2.5 MG CAPS Take by mouth.     BREZTRI AEROSPHERE 160-9-4.8 MCG/ACT AERO INHALE 2 PUFFS INTO THE LUNGS IN THE MORNING AND AT BEDTIME. 3 each 3   Cholecalciferol (VITAMIN D3) 5000 units TABS Take 10,000 Units by mouth every evening.     cloNIDine (CATAPRES) 0.2 MG tablet Take 1 tablet (0.2 mg total) by mouth 2 (two) times daily. 180 tablet 1   escitalopram (LEXAPRO) 20 MG tablet Take 1 tablet (20 mg total) by mouth daily. 90 tablet 1   fish oil-omega-3 fatty acids 1000  MG capsule Take 1,000 mg by mouth at bedtime.     fluticasone furoate-vilanterol (BREO ELLIPTA) 100-25 MCG/ACT AEPB Inhale into the lungs. (Patient not taking: Reported on 11/18/2022)     gabapentin (NEURONTIN) 100 MG capsule Take 1 capsule (100 mg total) by mouth at bedtime. 90 capsule 1   levofloxacin (LEVAQUIN) 500 MG tablet Take 1 tablet (500 mg total) by mouth daily. 7 tablet 0   metFORMIN (GLUCOPHAGE) 500 MG tablet Take 1 tablet (500 mg total) by mouth 2 (two) times daily with a meal. 180 tablet 1   metoprolol tartrate (LOPRESSOR) 25 MG tablet Take 1 tablet (25 mg total) by mouth 2 (two) times daily. 180 tablet 1   pramipexole (MIRAPEX) 1 MG tablet Take 1 tablet (1 mg total) by mouth in the morning and at bedtime. 180 tablet 1   Sodium Sulfate-Mag Sulfate-KCl (SUTAB) [phone] MG TABS Use as directed (Patient not taking: Reported on 11/18/2022) 24 tablet 0   vitamin B-12 (CYANOCOBALAMIN) 1000 MCG tablet Take 1 tablet (1,000 mcg total) by mouth daily. 30 tablet 0   zolpidem (AMBIEN) 10 MG tablet Take 1 tablet (10 mg total) by mouth at bedtime as needed for sleep. (Patient not taking: Reported on 11/11/2022) 15 tablet 1   Current Facility-Administered Medications  Medication Dose Route Frequency Provider Last  Rate Last Admin   0.9 %  sodium chloride infusion  500 mL Intravenous Continuous Imogene Burn, MD        Allergies as of 12/15/2022 - Review Complete 11/24/2022  Allergen Reaction Noted   Food Other (See Comments) 05/15/2011   Nutmeg oil (myristica oil) Hives 04/16/2018   Other Other (See Comments) 05/15/2011   Amlodipine Swelling 04/17/2021    Family History  Problem Relation Age of Onset   Heart disease Mother    Heart disease Father    Heart attack Sister    Bipolar disorder Sister    Cancer Maternal Aunt        pt unaware of what kind   Cancer Maternal Grandmother        ovarian   Thyroid disease Daughter    Anxiety disorder Daughter    Depression Daughter    Colon cancer Neg Hx    Colon polyps Neg Hx    Esophageal cancer Neg Hx    Rectal cancer Neg Hx    Stomach cancer Neg Hx     Social History   Socioeconomic History   Marital status: Single    Spouse name: Not on file   Number of children: 1   Years of education: 12+   Highest education level: Some college, no degree  Occupational History   Occupation: retired  Tobacco Use   Smoking status: Former    Packs/day: 1.00    Years: 45.00    Additional pack years: 0.00    Total pack years: 45.00    Types: Cigarettes    Quit date: 11/13/2021    Years since quitting: 1.0    Passive exposure: Past   Smokeless tobacco: Never   Tobacco comments:    Smoking Cessation Classes & Resources Offered.  Vaping Use   Vaping Use: Never used  Substance and Sexual Activity   Alcohol use: Yes    Alcohol/week: 1.0 standard drink of alcohol    Types: 1 Glasses of wine per week    Comment: occasionally   Drug use: No   Sexual activity: Not Currently    Birth control/protection: Post-menopausal  Other Topics Concern  Not on file  Social History Narrative   Lives at home alone. She has a daughter and grandchildren that she interacts with often.    Social Determinants of Health   Financial Resource Strain: Medium  Risk (09/21/2022)   Overall Financial Resource Strain (CARDIA)    Difficulty of Paying Living Expenses: Somewhat hard  Food Insecurity: Food Insecurity Present (12/04/2022)   Hunger Vital Sign    Worried About Running Out of Food in the Last Year: Never true    Ran Out of Food in the Last Year: Sometimes true  Transportation Needs: No Transportation Needs (12/04/2022)   PRAPARE - Administrator, Civil Service (Medical): No    Lack of Transportation (Non-Medical): No  Physical Activity: Insufficiently Active (09/21/2022)   Exercise Vital Sign    Days of Exercise per Week: 3 days    Minutes of Exercise per Session: 30 min  Stress: No Stress Concern Present (09/21/2022)   [employer] of Occupational Health - Occupational Stress Questionnaire    Feeling of Stress : Only a little  Recent Concern: Stress - Stress Concern Present (07/23/2022)   [employer] of Occupational Health - Occupational Stress Questionnaire    Feeling of Stress : To some extent  Social Connections: Moderately Isolated (09/21/2022)   Social Connection and Isolation Panel [NHANES]    Frequency of Communication with Friends and Family: More than three times a week    Frequency of Social Gatherings with Friends and Family: More than three times a week    Attends Religious Services: 1 to 4 times per year    Active Member of [employer] or Organizations: No    Attends Banker Meetings: Never    Marital Status: Never married  Intimate Partner Violence: Not At Risk (09/21/2022)   Humiliation, Afraid, Rape, and Kick questionnaire    Fear of Current or Ex-Partner: No    Emotionally Abused: No    Physically Abused: No    Sexually Abused: No    Physical Exam: Vital signs in last 24 hours: BP (!) 197/99   Pulse 72   Temp 97.8 F (36.6 C)   Ht 5\' 6"  (1.676 m)   Wt 217 lb (98.4 kg)   LMP 10/17/2005   SpO2 93%   BMI 35.02 kg/m  GEN: NAD EYE: Sclerae anicteric ENT: MMM CV:  Non-tachycardic Pulm: No increased WOB GI: Soft NEURO:  Alert & Oriented   Eulah Pont, MD [facility]   12/15/2022 9:07 AM

## 2022-12-15 NOTE — Progress Notes (Signed)
Report to PACU, RN, vss, BBS= Clear.  

## 2022-12-15 NOTE — Op Note (Signed)
[facility] Patient Name: [name] Procedure Date: 12/15/2022 9:46 AM MRN: [id number] Endoscopist: Madelyn Brunner [place] , , [id number] Age: 67 Referring MD:  Date of Birth: 09/25/1955 Gender: Female Account #: [id number] Procedure:                Colonoscopy Indications:              Positive Cologuard test Medicines:                Monitored Anesthesia Care Procedure:                Pre-Anesthesia Assessment:                           - Prior to the procedure, a History and Physical                            was performed, and patient medications and                            allergies were reviewed. The patient's tolerance of                            previous anesthesia was also reviewed. The risks                            and benefits of the procedure and the sedation                            options and risks were discussed with the patient.                            All questions were answered, and informed consent                            was obtained. Prior Anticoagulants: The patient has                            taken no anticoagulant or antiplatelet agents. ASA                            Grade Assessment: III - A patient with severe                            systemic disease. After reviewing the risks and                            benefits, the patient was deemed in satisfactory                            condition to undergo the procedure.                           After obtaining informed consent, the colonoscope  was passed under direct vision. Throughout the                            procedure, the patient's blood pressure, pulse, and                            oxygen saturations were monitored continuously. The                            Olympus CF-HQ190L ([id number]) Colonoscope was                            introduced through the anus and advanced to the the                            terminal ileum. The  colonoscopy was performed                            without difficulty. The patient tolerated the                            procedure well. The quality of the bowel                            preparation was good. The terminal ileum, ileocecal                            valve, appendiceal orifice, and rectum were                            photographed. Scope In: 9:49:39 AM Scope Out: 10:16:47 AM Scope Withdrawal Time: 0 hours 17 minutes 50 seconds  Total Procedure Duration: 0 hours 27 minutes 8 seconds  Findings:                 The terminal ileum appeared normal.                           Two sessile polyps were found in the transverse                            colon and ascending colon. The polyps were 3 to 5                            mm in size. These polyps were removed with a cold                            snare. Resection and retrieval were complete.                           Four sessile polyps were found in the sigmoid colon                            and descending colon. The polyps were 3 to 6  mm in                            size. These polyps were removed with a cold snare.                            Resection and retrieval were complete.                           A 10 mm polyp was found in the sigmoid colon. The                            polyp was pedunculated. The polyp was removed with                            a hot snare. Resection and retrieval were complete.                           Non-bleeding internal hemorrhoids were found during                            retroflexion. Complications:            No immediate complications. Estimated Blood Loss:     Estimated blood loss was minimal. Impression:               - The examined portion of the ileum was normal.                           - Two 3 to 5 mm polyps in the transverse colon and                            in the ascending colon, removed with a cold snare.                            Resected and  retrieved.                           - Four 3 to 6 mm polyps in the sigmoid colon and in                            the descending colon, removed with a cold snare.                            Resected and retrieved.                           - One 10 mm polyp in the sigmoid colon, removed                            with a hot snare. Resected and retrieved.                           - Non-bleeding internal hemorrhoids. Recommendation:           -  Discharge patient to home (with escort).                           - Await pathology results.                           - The findings and recommendations were discussed                            with the patient. Dr Particia Lather "[place]" [place],  12/15/2022 10:23:00 AM

## 2022-12-15 NOTE — Patient Instructions (Addendum)
Handouts on hemorrhoids and polyps given to patient. Await pathology results. Resume previous diet and continue present medications. Repeat colonoscopy for surveillance will be determined based off of pathology results.   YOU HAD AN ENDOSCOPIC PROCEDURE TODAY AT THE [facility]:   Refer to the procedure report that was given to you for any specific questions about what was found during the examination.  If the procedure report does not answer your questions, please call your gastroenterologist to clarify.  If you requested that your care partner not be given the details of your procedure findings, then the procedure report has been included in a sealed envelope for you to review at your convenience later.  YOU SHOULD EXPECT: Some feelings of bloating in the abdomen. Passage of more gas than usual.  Walking can help get rid of the air that was put into your GI tract during the procedure and reduce the bloating. If you had a lower endoscopy (such as a colonoscopy or flexible sigmoidoscopy) you may notice spotting of blood in your stool or on the toilet paper. If you underwent a bowel prep for your procedure, you may not have a normal bowel movement for a few days.  Please Note:  You might notice some irritation and congestion in your nose or some drainage.  This is from the oxygen used during your procedure.  There is no need for concern and it should clear up in a day or so.  SYMPTOMS TO REPORT IMMEDIATELY:  Following lower endoscopy (colonoscopy or flexible sigmoidoscopy):  Excessive amounts of blood in the stool  Significant tenderness or worsening of abdominal pains  Swelling of the abdomen that is new, acute  Fever of 100F or higher  For urgent or emergent issues, a gastroenterologist can be reached at any hour by calling [phone]. Do not use MyChart messaging for urgent concerns.    DIET:  We do recommend a small meal at first, but then you may proceed to your regular  diet.  Drink plenty of fluids but you should avoid alcoholic beverages for 24 hours.  ACTIVITY:  You should plan to take it easy for the rest of today and you should NOT DRIVE or use heavy machinery until tomorrow (because of the sedation medicines used during the test).    FOLLOW UP: Our staff will call the number listed on your records the next business day following your procedure.  We will call around 7:15- 8:00 am to check on you and address any questions or concerns that you may have regarding the information given to you following your procedure. If we do not reach you, we will leave a message.     If any biopsies were taken you will be contacted by phone or by letter within the next 1-3 weeks.  Please call us at [phone] if you have not heard about the biopsies in 3 weeks.    SIGNATURES/CONFIDENTIALITY: You and/or your care partner have signed paperwork which will be entered into your electronic medical record.  These signatures attest to the fact that that the information above on your After Visit Summary has been reviewed and is understood.  Full responsibility of the confidentiality of this discharge information lies with you and/or your care-partner.  

## 2022-12-16 ENCOUNTER — Telehealth: Payer: Self-pay

## 2022-12-16 NOTE — Telephone Encounter (Signed)
  Follow up Call-     12/15/2022    9:12 AM  Call back number  Post procedure Call Back phone  # [phone]  Permission to leave phone message No     Patient questions:  Do you have a fever, pain , or abdominal swelling? No. Pain Score  0 *  Have you tolerated food without any problems? Yes.    Have you been able to return to your normal activities? Yes.    Do you have any questions about your discharge instructions: Diet   No. Medications  No. Follow up visit  No.  Do you have questions or concerns about your Care? No.  Actions: * If pain score is 4 or above: No action needed, pain <4.

## 2022-12-18 ENCOUNTER — Encounter: Payer: Self-pay | Admitting: Internal Medicine

## 2023-01-05 ENCOUNTER — Encounter: Payer: Self-pay | Admitting: *Deleted

## 2023-01-05 ENCOUNTER — Telehealth: Payer: Self-pay | Admitting: *Deleted

## 2023-01-05 NOTE — Progress Notes (Signed)
  Care Coordination Note  01/05/2023 Name: [name] MRN: [id number] DOB: [birth year]  [name] is a 68 y.o. year old female who is a primary care patient of Bennie Pierini, FNP and is actively engaged with the care management team. I reached out to [name] by phone today to assist with re-scheduling a follow up visit with the RN Case Manager  Follow up plan: Unsuccessful telephone outreach attempt made. A HIPAA compliant phone message was left for the patient providing contact information and requesting a return call.   [facility]  Care Coordination Care Guide  Direct Dial: [phone]

## 2023-01-13 NOTE — Progress Notes (Signed)
  Care Coordination Note  01/13/2023 Name: [name] MRN: [id number] DOB: [birth year]  [name] is a 67 y.o. year old female who is a primary care patient of Bennie Pierini, FNP and is actively engaged with the care management team. I reached out to [name] by phone today to assist with re-scheduling a follow up visit with the RN Case Manager  Follow up plan: Telephone appointment with care management team member scheduled for:01/22/23  [facility] Coordination Care Guide  Direct Dial: [phone]

## 2023-01-15 DIAGNOSIS — R0781 Pleurodynia: Secondary | ICD-10-CM | POA: Diagnosis not present

## 2023-01-15 DIAGNOSIS — E042 Nontoxic multinodular goiter: Secondary | ICD-10-CM | POA: Diagnosis not present

## 2023-01-15 DIAGNOSIS — R0602 Shortness of breath: Secondary | ICD-10-CM | POA: Diagnosis not present

## 2023-01-15 DIAGNOSIS — J441 Chronic obstructive pulmonary disease with (acute) exacerbation: Secondary | ICD-10-CM | POA: Diagnosis not present

## 2023-01-15 DIAGNOSIS — R918 Other nonspecific abnormal finding of lung field: Secondary | ICD-10-CM | POA: Diagnosis not present

## 2023-01-15 DIAGNOSIS — Z87891 Personal history of nicotine dependence: Secondary | ICD-10-CM | POA: Diagnosis not present

## 2023-01-15 DIAGNOSIS — J189 Pneumonia, unspecified organism: Secondary | ICD-10-CM | POA: Diagnosis not present

## 2023-01-15 DIAGNOSIS — J90 Pleural effusion, not elsewhere classified: Secondary | ICD-10-CM | POA: Diagnosis not present

## 2023-01-15 DIAGNOSIS — F329 Major depressive disorder, single episode, unspecified: Secondary | ICD-10-CM | POA: Diagnosis not present

## 2023-01-15 DIAGNOSIS — E785 Hyperlipidemia, unspecified: Secondary | ICD-10-CM | POA: Diagnosis not present

## 2023-01-15 DIAGNOSIS — E669 Obesity, unspecified: Secondary | ICD-10-CM | POA: Diagnosis not present

## 2023-01-15 DIAGNOSIS — R059 Cough, unspecified: Secondary | ICD-10-CM | POA: Diagnosis not present

## 2023-01-15 DIAGNOSIS — E119 Type 2 diabetes mellitus without complications: Secondary | ICD-10-CM | POA: Diagnosis not present

## 2023-01-15 DIAGNOSIS — R0789 Other chest pain: Secondary | ICD-10-CM | POA: Diagnosis not present

## 2023-01-15 DIAGNOSIS — I10 Essential (primary) hypertension: Secondary | ICD-10-CM | POA: Diagnosis not present

## 2023-01-22 ENCOUNTER — Ambulatory Visit: Payer: Self-pay | Admitting: *Deleted

## 2023-01-22 NOTE — Patient Outreach (Signed)
Care Coordination   Follow Up Visit Note   04/16/2023 late entry for 01/22/23 Name: DEMA MCKIM MRN: [id number] DOB: 10-10-1955  LIRIDONA BULTEMEIER is a 67 y.o. year old female who sees Bennie Pierini, FNP for primary care. I spoke with  [name] by phone today.  What matters to the patients health and wellness today?  COPD better today She was diagnosed with pneumonia  During the first 2 minutes, the call dropped RN CM was not able to reach patient with a call back & her voice main box was full.   Goals Addressed             This Visit's Progress    Management of COPD/pneumonia-care coordination services       Interventions Today    Flowsheet Row Most Recent Value  Chronic Disease   Chronic disease during today's visit Chronic Obstructive Pulmonary Disease (COPD)  General Interventions   General Interventions Discussed/Reviewed General Interventions Reviewed, Doctor Visits  Doctor Visits Discussed/Reviewed Doctor Visits Reviewed  PCP/Specialist Visits Compliance with follow-up visit              SDOH assessments and interventions completed:  No     Care Coordination Interventions:  Yes, provided   Follow up plan: Follow up call scheduled for 04/26/23    Encounter Outcome:  Pt. Visit Completed   Jaira Canady L. Noelle Penner, RN, BSN, CCM [facility] Care Management Community Coordinator Office number [phone]

## 2023-01-22 NOTE — Patient Instructions (Addendum)
Visit Information  Thank you for taking time to visit with me today. Please don't hesitate to contact me if I can be of assistance to you.   Following are the goals we discussed today:   Goals Addressed             This Visit's Progress    Management of COPD/pneumonia-care coordination services       Interventions Today    Flowsheet Row Most Recent Value  Chronic Disease   Chronic disease during today's visit Chronic Obstructive Pulmonary Disease (COPD)  General Interventions   General Interventions Discussed/Reviewed General Interventions Reviewed, Doctor Visits  Doctor Visits Discussed/Reviewed Doctor Visits Reviewed  PCP/Specialist Visits Compliance with follow-up visit              Our next appointment is by telephone on 04/26/23 at 1:30 pm  Please call the care guide team at [phone] if you need to cancel or reschedule your appointment.   If you are experiencing a Mental Health or Behavioral Health Crisis or need someone to talk to, please call the Suicide and Crisis Lifeline: 988 call the [place] National Suicide Prevention Lifeline: 865-687-4523 or TTY: (410)494-2593 TTY (480)215-8853) to talk to a trained counselor call 1-800-273-TALK (toll free, 24 hour hotline) call the [facility]: [phone] call 911   Patient verbalizes understanding of instructions and care plan provided today and agrees to view in MyChart. Active MyChart status and patient understanding of how to access instructions and care plan via MyChart confirmed with patient.     The patient has been provided with contact information for the care management team and has been advised to call with any health related questions or concerns.   Nolton Denis L. Noelle Penner, RN, BSN, CCM [facility] Care Management Community Coordinator Office number [phone]

## 2023-01-29 ENCOUNTER — Other Ambulatory Visit: Payer: Self-pay | Admitting: Nurse Practitioner

## 2023-04-06 ENCOUNTER — Other Ambulatory Visit: Payer: Self-pay | Admitting: Nurse Practitioner

## 2023-04-06 DIAGNOSIS — Z7982 Long term (current) use of aspirin: Secondary | ICD-10-CM | POA: Diagnosis not present

## 2023-04-06 DIAGNOSIS — Z7984 Long term (current) use of oral hypoglycemic drugs: Secondary | ICD-10-CM | POA: Diagnosis not present

## 2023-04-06 DIAGNOSIS — E119 Type 2 diabetes mellitus without complications: Secondary | ICD-10-CM | POA: Diagnosis not present

## 2023-04-06 DIAGNOSIS — I10 Essential (primary) hypertension: Secondary | ICD-10-CM | POA: Diagnosis not present

## 2023-04-06 DIAGNOSIS — I2510 Atherosclerotic heart disease of native coronary artery without angina pectoris: Secondary | ICD-10-CM | POA: Diagnosis not present

## 2023-04-06 DIAGNOSIS — Z87891 Personal history of nicotine dependence: Secondary | ICD-10-CM | POA: Diagnosis not present

## 2023-04-06 DIAGNOSIS — E785 Hyperlipidemia, unspecified: Secondary | ICD-10-CM | POA: Diagnosis not present

## 2023-04-06 DIAGNOSIS — R519 Headache, unspecified: Secondary | ICD-10-CM | POA: Diagnosis not present

## 2023-04-06 DIAGNOSIS — E669 Obesity, unspecified: Secondary | ICD-10-CM | POA: Diagnosis not present

## 2023-04-06 DIAGNOSIS — Z792 Long term (current) use of antibiotics: Secondary | ICD-10-CM | POA: Diagnosis not present

## 2023-04-06 DIAGNOSIS — Z1231 Encounter for screening mammogram for malignant neoplasm of breast: Secondary | ICD-10-CM

## 2023-04-07 ENCOUNTER — Telehealth: Payer: Self-pay | Admitting: Nurse Practitioner

## 2023-04-07 NOTE — Telephone Encounter (Signed)
Appt made with MMM 9/12 at 2:15pm. Pt aware.

## 2023-04-07 NOTE — Telephone Encounter (Signed)
Pt says she was seen in the ER for high BP and was told to double up on her Metoprolol and that it should bring down her BP but it may also decrease her heart rate. Pt says right now her BP is 160/90 and Heart Rate is 48. Should she be concerned? Offered to schedule her an ER F/U but pt declined.

## 2023-04-07 NOTE — Telephone Encounter (Signed)
Patient needs follow-up for her blood pressure, schedule as soon as is available.  I would not have her double up on the metoprolol because of her low heart rate.  She needs to follow-up with PCP

## 2023-04-08 ENCOUNTER — Encounter: Payer: Self-pay | Admitting: Nurse Practitioner

## 2023-04-08 ENCOUNTER — Ambulatory Visit (INDEPENDENT_AMBULATORY_CARE_PROVIDER_SITE_OTHER): Payer: Medicare HMO | Admitting: Nurse Practitioner

## 2023-04-08 VITALS — BP 163/73 | HR 51 | Temp 97.9°F | Resp 20 | Ht 66.0 in | Wt 220.0 lb

## 2023-04-08 DIAGNOSIS — I10 Essential (primary) hypertension: Secondary | ICD-10-CM | POA: Diagnosis not present

## 2023-04-08 MED ORDER — CLONIDINE HCL 0.3 MG PO TABS
0.3000 mg | ORAL_TABLET | Freq: Two times a day (BID) | ORAL | 1 refills | Status: DC
Start: 2023-04-08 — End: 2023-11-05

## 2023-04-08 NOTE — Patient Instructions (Signed)

## 2023-04-08 NOTE — Progress Notes (Signed)
Subjective:    Patient ID: [name], female    DOB: 1956/07/27, 67 y.o.   MRN: [id number]   Chief Complaint: BP has been fluctuating   HPI  Patient went to the ED with elevated blood pressure. They increased metoprolol to 50mg  BID. She says that has not really helped. Blood pressure has been over 200systolic. Heart rate went to the 50's 1 day.  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  Eyes:  Negative for pain.  Respiratory:  Negative for shortness of breath.   Cardiovascular:  Negative for chest pain, palpitations and leg swelling.  Gastrointestinal:  Negative for abdominal pain.  Endocrine: Negative for polydipsia.  Skin:  Negative for rash.  Neurological:  Negative for dizziness, weakness and headaches.  Hematological:  Does not bruise/bleed easily.  All other systems reviewed and are negative.      Objective:   Physical Exam Vitals and nursing note reviewed.  Constitutional:      General: She is not in acute distress.    Appearance: Normal appearance. She is well-developed.  Neck:     Vascular: No  carotid bruit or JVD.  Cardiovascular:     Rate and Rhythm: Normal rate and regular rhythm.     Heart sounds: Normal heart sounds.  Pulmonary:     Effort: Pulmonary effort is normal. No respiratory distress.     Breath sounds: Normal breath sounds. No wheezing or rales.  Chest:     Chest wall: No tenderness.  Abdominal:     General: Bowel sounds are normal. There is no distension or abdominal bruit.     Palpations: Abdomen is soft. There is no hepatomegaly, splenomegaly, mass or pulsatile mass.     Tenderness: There is no abdominal tenderness.  Musculoskeletal:        General: Normal range of motion.     Cervical back: Normal range of motion and neck supple.  Lymphadenopathy:     Cervical: No cervical adenopathy.  Skin:    General: Skin is warm and dry.  Neurological:     Mental Status: She is alert and oriented to person, place, and time.     Deep Tendon Reflexes: Reflexes are normal and symmetric.  Psychiatric:        Behavior: Behavior normal.        Thought Content: Thought content normal.        Judgment: Judgment normal.    BP (!) 163/73   Pulse (!) 51   Temp 97.9 F (36.6 C) (Temporal)   Resp 20   Ht 5\' 6"  (1.676 m)   Wt 220 lb (  99.8 kg)   LMP 10/17/2005   SpO2 92%   BMI 35.51 kg/m         Assessment & Plan:   [name] in today with chief complaint of Hospitalization Follow-up   1. Primary hypertension Go back on metoprolol 25mg  BID Increase clonidine to 0.3mg  bid Keep diary of blood pressure Follow up in 2 weeks - cloNIDine (CATAPRES) 0.3 MG tablet; Take 1 tablet (0.3 mg total) by mouth 2 (two) times daily.  Dispense: 180 tablet; Refill: 1  Hospital records reviewed  The above assessment and management plan was discussed with the patient. The patient verbalized understanding of and has agreed to the management plan. Patient is aware to call the clinic if symptoms persist or worsen. Patient is aware when to return to the clinic for a follow-up  visit. Patient educated on when it is appropriate to go to the emergency department.   [name] [name], FNP

## 2023-04-22 ENCOUNTER — Ambulatory Visit: Payer: Medicare HMO | Admitting: Nurse Practitioner

## 2023-04-22 ENCOUNTER — Other Ambulatory Visit: Payer: Medicare HMO

## 2023-04-26 ENCOUNTER — Other Ambulatory Visit: Payer: Self-pay | Admitting: Nurse Practitioner

## 2023-04-26 ENCOUNTER — Ambulatory Visit: Payer: Self-pay | Admitting: *Deleted

## 2023-04-26 NOTE — Patient Outreach (Signed)
Care Coordination   04/26/2023 Name: [name] MRN: [id number] DOB: [birth year]   Care Coordination Outreach Attempts:  An unsuccessful telephone outreach was attempted for a scheduled appointment today.  Follow Up Plan:  Additional outreach attempts will be made to offer the patient care coordination information and services.   Encounter Outcome:  No Answer   Care Coordination Interventions:  No, not indicated    [name] L. Noelle Penner, RN, BSN, CCM, Care Management Coordinator [phone]

## 2023-05-03 ENCOUNTER — Ambulatory Visit (HOSPITAL_BASED_OUTPATIENT_CLINIC_OR_DEPARTMENT_OTHER)
Admission: RE | Admit: 2023-05-03 | Discharge: 2023-05-03 | Disposition: A | Discharge status: Home / Self Care | Payer: Medicare HMO | Source: Ambulatory Visit | Attending: Acute Care | Admitting: Acute Care

## 2023-05-03 DIAGNOSIS — Z87891 Personal history of nicotine dependence: Secondary | ICD-10-CM | POA: Diagnosis not present

## 2023-05-03 DIAGNOSIS — Z122 Encounter for screening for malignant neoplasm of respiratory organs: Secondary | ICD-10-CM | POA: Insufficient documentation

## 2023-05-06 ENCOUNTER — Ambulatory Visit: Payer: Medicare HMO

## 2023-05-06 ENCOUNTER — Ambulatory Visit
Admission: RE | Admit: 2023-05-06 | Discharge: 2023-05-06 | Disposition: A | Discharge status: Home / Self Care | Payer: Medicare HMO | Source: Ambulatory Visit | Attending: Nurse Practitioner | Admitting: Nurse Practitioner

## 2023-05-06 DIAGNOSIS — Z1231 Encounter for screening mammogram for malignant neoplasm of breast: Secondary | ICD-10-CM

## 2023-05-17 ENCOUNTER — Ambulatory Visit (INDEPENDENT_AMBULATORY_CARE_PROVIDER_SITE_OTHER): Payer: Medicare HMO | Admitting: Nurse Practitioner

## 2023-05-17 ENCOUNTER — Encounter: Payer: Self-pay | Admitting: Nurse Practitioner

## 2023-05-17 ENCOUNTER — Other Ambulatory Visit: Payer: Self-pay | Admitting: Acute Care

## 2023-05-17 ENCOUNTER — Other Ambulatory Visit: Payer: Medicare HMO

## 2023-05-17 VITALS — BP 185/97 | HR 64 | Temp 97.9°F | Resp 20 | Ht 66.0 in | Wt 222.0 lb

## 2023-05-17 DIAGNOSIS — F3342 Major depressive disorder, recurrent, in full remission: Secondary | ICD-10-CM

## 2023-05-17 DIAGNOSIS — J449 Chronic obstructive pulmonary disease, unspecified: Secondary | ICD-10-CM | POA: Diagnosis not present

## 2023-05-17 DIAGNOSIS — I10 Essential (primary) hypertension: Secondary | ICD-10-CM | POA: Diagnosis not present

## 2023-05-17 DIAGNOSIS — N182 Chronic kidney disease, stage 2 (mild): Secondary | ICD-10-CM

## 2023-05-17 DIAGNOSIS — F5101 Primary insomnia: Secondary | ICD-10-CM

## 2023-05-17 DIAGNOSIS — Z87891 Personal history of nicotine dependence: Secondary | ICD-10-CM

## 2023-05-17 DIAGNOSIS — F411 Generalized anxiety disorder: Secondary | ICD-10-CM

## 2023-05-17 DIAGNOSIS — I2510 Atherosclerotic heart disease of native coronary artery without angina pectoris: Secondary | ICD-10-CM | POA: Diagnosis not present

## 2023-05-17 DIAGNOSIS — N3945 Continuous leakage: Secondary | ICD-10-CM

## 2023-05-17 DIAGNOSIS — E559 Vitamin D deficiency, unspecified: Secondary | ICD-10-CM | POA: Diagnosis not present

## 2023-05-17 DIAGNOSIS — E1169 Type 2 diabetes mellitus with other specified complication: Secondary | ICD-10-CM | POA: Diagnosis not present

## 2023-05-17 DIAGNOSIS — E1122 Type 2 diabetes mellitus with diabetic chronic kidney disease: Secondary | ICD-10-CM | POA: Diagnosis not present

## 2023-05-17 DIAGNOSIS — E785 Hyperlipidemia, unspecified: Secondary | ICD-10-CM | POA: Diagnosis not present

## 2023-05-17 DIAGNOSIS — I6523 Occlusion and stenosis of bilateral carotid arteries: Secondary | ICD-10-CM

## 2023-05-17 DIAGNOSIS — Z122 Encounter for screening for malignant neoplasm of respiratory organs: Secondary | ICD-10-CM

## 2023-05-17 DIAGNOSIS — G2581 Restless legs syndrome: Secondary | ICD-10-CM

## 2023-05-17 DIAGNOSIS — E041 Nontoxic single thyroid nodule: Secondary | ICD-10-CM

## 2023-05-17 LAB — BAYER DCA HB A1C WAIVED: HB A1C (BAYER DCA - WAIVED): 7.4 % — ABNORMAL HIGH (ref 4.8–5.6)

## 2023-05-17 MED ORDER — LISINOPRIL 40 MG PO TABS
40.0000 mg | ORAL_TABLET | Freq: Every day | ORAL | 3 refills | Status: DC
Start: 2023-05-17 — End: 2023-11-05

## 2023-05-17 MED ORDER — DOXEPIN HCL 10 MG/ML PO CONC
10.0000 mg | Freq: Every day | ORAL | 5 refills | Status: DC
Start: 2023-05-17 — End: 2023-08-17

## 2023-05-17 MED ORDER — TOLTERODINE TARTRATE 2 MG PO TABS
2.0000 mg | ORAL_TABLET | Freq: Two times a day (BID) | ORAL | 1 refills | Status: DC
Start: 2023-05-17 — End: 2023-08-17

## 2023-05-17 NOTE — Progress Notes (Signed)
Subjective:    Patient ID: [name], female    DOB: [birth year], 67 y.o.   MRN: [id number]   Chief Complaint: Medical Management of Chronic Issues    HPI:  [name] is a 67 y.o. who identifies as a female who was assigned female at birth.   Social history: Lives with: by herself Work history: disability   Comes in today for follow up of the following chronic medical issues:  1. Primary hypertension No c/o chest pain, sob or headache. Does check blood pressure at home. Blood pressure runs above 150 systolic. BP Readings from Last 3 Encounters:  05/17/23 (!) 185/97  04/08/23 (!) 163/73  12/15/22 117/78     2. Type 2 diabetes mellitus with stage 2 chronic kidney disease, without long-term current use of insulin (HCC) Se does not check her blood sugar at home. Lab Results  Component Value Date   HGBA1C 6.9 (H) 10/12/2022     3. Hyperlipidemia associated with type 2 diabetes mellitus (HCC) Does try to watch diet but does no exercises at all. Lab Results  Component Value Date   CHOL 126 10/12/2022   HDL 45 10/12/2022   LDLCALC 57 10/12/2022   LDLDIRECT 100.0 05/16/2007   TRIG 136 10/12/2022   CHOLHDL 2.8 10/12/2022     4. CAD S/P percutaneous coronary angioplasty Has not seen cardiology in awhile  5. Bilateral carotid artery stenosis Last carotid doppler study was done in 2020. Showed minor stenosis.  6. Chronic obstructive pulmonary disease, unspecified COPD type (HCC) Quit smoking 2 years ago. Only inhaler is albuterol which she uses occasionally. Had recent low dose CT scan which was negative for malignancy.  7. Recurrent major depressive disorder, in full remission (HCC) Is on lexapro and is doing about the same. She never wants to change her meds    05/17/2023    2:03 PM 04/08/2023    2:29 PM 11/11/2022   11:13 AM  Depression screen PHQ 2/9  Decreased Interest 2 2 2   Down, Depressed, Hopeless 2 2 2   PHQ - 2 Score 4 4 4   Altered sleeping  3 3 3   Tired, decreased energy 3 3 2   Change in appetite 3 2 2   Feeling bad or failure about yourself  2 1 2   Trouble concentrating 2 2 1   Moving slowly or fidgety/restless 1 1 1   Suicidal thoughts 1 1 0  PHQ-9 Score 19 17 15   Difficult doing work/chores Very difficult Somewhat difficult Very difficult     8. Generalized anxiety disorder Is on no antianxiety meds other than lexapro. She failed a drug test so we stopped her xanax. She says her anxiety is from not sleeping. She has been smoking marijuana lately.    05/17/2023    2:03 PM 04/08/2023    2:30 PM 11/11/2022   11:13 AM 10/12/2022    2:06 PM  GAD 7 : Generalized Anxiety Score  Nervous, Anxious, on Edge 3 2 2  0  Control/stop worrying 2 2 1  0  Worry too much - different things 2 2 1  0  Trouble relaxing 3 2 2  0  Restless 2 1 1  0  Easily annoyed or irritable 3 1 1  0  Afraid - awful might happen 2 2 2  0  Total GAD 7 Score 17 12 10  0  Anxiety Difficulty Very difficult Somewhat difficult Somewhat difficult Not difficult at all      9. Restless leg syndrome Is on mirapex and that is working well  for her.  10. Primary insomnia We tried [place] and that made her "bonkers". Only sleeping about 2-4 hours at a time. She says trazadone did not work either.  11. Vitamin D deficiency Is on daily vitamin d supplement  12. Morbid obesity (HCC) No recent weight changes Wt Readings from Last 3 Encounters:  05/17/23 222 lb (100.7 kg)  04/08/23 220 lb (99.8 kg)  12/15/22 217 lb (98.4 kg)   BMI Readings from Last 3 Encounters:  05/17/23 35.83 kg/m  04/08/23 35.51 kg/m  12/15/22 35.02 kg/m      New complaints: Thyroid nodule seen on CT scan of chest in 2023- need to recheck  Urinary incontinence has worsened.  Allergies  Allergen Reactions   Food Other (See Comments)    Nutmeg= difficulty breathing   Nutmeg Oil (Myristica Oil) Hives   Other Other (See Comments)    Nutmeg= difficulty breathing   Amlodipine Swelling    Outpatient Encounter Medications as of 05/17/2023  Medication Sig   albuterol (VENTOLIN HFA) 108 (90 Base) MCG/ACT inhaler Inhale 2 puffs into the lungs every 6 (six) hours as needed for wheezing or shortness of breath.   Alcohol Swabs (B-D SINGLE USE SWABS REGULAR) PADS check blood sugars twice daily Dx E11.9   aspirin EC 81 MG tablet Take 1 tablet (81 mg total) by mouth daily. Swallow whole.   atorvastatin (LIPITOR) 80 MG tablet Take 1 tablet (80 mg total) by mouth at bedtime.   Biotin 2.5 MG CAPS Take by mouth.   BREZTRI AEROSPHERE 160-9-4.8 MCG/ACT AERO INHALE 2 PUFFS INTO THE LUNGS IN THE MORNING AND AT BEDTIME.   Cholecalciferol (VITAMIN D3) 5000 units TABS Take 10,000 Units by mouth every evening.   cloNIDine (CATAPRES) 0.3 MG tablet Take 1 tablet (0.3 mg total) by mouth 2 (two) times daily.   escitalopram (LEXAPRO) 20 MG tablet Take 1 tablet (20 mg total) by mouth daily.   fish oil-omega-3 fatty acids 1000 MG capsule Take 1,000 mg by mouth at bedtime.   gabapentin (NEURONTIN) 100 MG capsule Take 1 capsule by mouth at bedtime   metFORMIN (GLUCOPHAGE) 500 MG tablet Take 1 tablet (500 mg total) by mouth 2 (two) times daily with a meal.   metoprolol tartrate (LOPRESSOR) 25 MG tablet Take 1 tablet (25 mg total) by mouth 2 (two) times daily.   pramipexole (MIRAPEX) 1 MG tablet Take 1 tablet (1 mg total) by mouth in the morning and at bedtime.   vitamin B-12 (CYANOCOBALAMIN) 1000 MCG tablet Take 1 tablet (1,000 mcg total) by mouth daily.   [DISCONTINUED] zolpidem (AMBIEN) 10 MG tablet Take 1 tablet (10 mg total) by mouth at bedtime as needed for sleep. (Patient not taking: Reported on 11/11/2022)   No facility-administered encounter medications on file as of 05/17/2023.    Past Surgical History:  Procedure Laterality Date   AXILLARY LYMPH NODE DISSECTION  07/31/2011   Procedure: AXILLARY LYMPH NODE DISSECTION;  Surgeon: [name], MD;  Location: [facility];   Service: General;  Laterality: Left;  left axillary sentinal node biopsy   BREAST LUMPECTOMY Left 06/30/2011   BREAST SURGERY     CARDIAC CATHETERIZATION  11/02/2006; 06/03/2011   CATARACT EXTRACTION Bilateral    CESAREAN SECTION  1987   CORONARY STENT PLACEMENT  11/02/2006   ELBOW SURGERY     right   LEFT HEART CATH AND CORONARY ANGIOGRAPHY N/A 10/07/2020   Procedure: LEFT HEART CATH AND CORONARY ANGIOGRAPHY;  Surgeon: [name], MD;  Location:  [facility];  Service: Cardiovascular;  Laterality: N/A;    Family History  Problem Relation Age of Onset   Heart disease Mother    Heart disease Father    Heart attack Sister    Bipolar disorder Sister    Cancer Maternal Aunt        pt unaware of what kind   Cancer Maternal Grandmother        ovarian   Thyroid disease Daughter    Anxiety disorder Daughter    Depression Daughter    Colon cancer Neg Hx    Colon polyps Neg Hx    Esophageal cancer Neg Hx    Rectal cancer Neg Hx    Stomach cancer Neg Hx       Controlled substance contract: n/a     Review of Systems  Constitutional:  Negative for diaphoresis.  Eyes:  Negative for pain.  Respiratory:  Negative for shortness of breath.   Cardiovascular:  Negative for chest pain, palpitations and leg swelling.  Gastrointestinal:  Negative for abdominal pain.  Endocrine: Negative for polydipsia.  Skin:  Negative for rash.  Neurological:  Negative for dizziness, weakness and headaches.  Hematological:  Does not bruise/bleed easily.  All other systems reviewed and are negative.      Objective:   Physical Exam Vitals and nursing note reviewed.  Constitutional:      General: She is not in acute distress.    Appearance: Normal appearance. She is well-developed.  HENT:     Head: Normocephalic.     Right Ear: Tympanic membrane normal.     Left Ear: Tympanic membrane normal.     Nose: Nose normal.     Mouth/Throat:     Mouth: Mucous membranes are moist.  Eyes:      Pupils: Pupils are equal, round, and reactive to light.  Neck:     Vascular: No carotid bruit or JVD.  Cardiovascular:     Rate and Rhythm: Normal rate and regular rhythm.     Heart sounds: Normal heart sounds.  Pulmonary:     Effort: Pulmonary effort is normal. No respiratory distress.     Breath sounds: Normal breath sounds. No wheezing or rales.  Chest:     Chest wall: No tenderness.  Abdominal:     General: Bowel sounds are normal. There is no distension or abdominal bruit.     Palpations: Abdomen is soft. There is no hepatomegaly, splenomegaly, mass or pulsatile mass.     Tenderness: There is no abdominal tenderness.  Musculoskeletal:        General: Normal range of motion.     Cervical back: Normal range of motion and neck supple.  Lymphadenopathy:     Cervical: No cervical adenopathy.  Skin:    General: Skin is warm and dry.  Neurological:     Mental Status: She is alert and oriented to person, place, and time.     Deep Tendon Reflexes: Reflexes are normal and symmetric.  Psychiatric:        Behavior: Behavior normal.        Thought Content: Thought content normal.        Judgment: Judgment normal.    BP (!) 185/97   Pulse 64   Temp 97.9 F (36.6 C) (Temporal)   Resp 20   Ht 5\' 6"  (1.676 m)   Wt 222 lb (100.7 kg)   LMP 10/17/2005   SpO2 95%   BMI 35.83 kg/m  Hgba1c 7.4%    Assessment & Plan:  [name] comes in today with chief complaint of Medical Management of Chronic Issues   Diagnosis and orders addressed:  1. Primary hypertension Low sodium diet Add lisinopril to meds Continue to check blood pressure at home - CBC with Differential/Platelet - CMP14+EGFR - lisinopril (ZESTRIL) 40 MG tablet; Take 1 tablet (40 mg total) by mouth daily.  Dispense: 90 tablet; Refill: 3  2. Type 2 diabetes mellitus with stage 2 chronic kidney disease, without long-term current use of insulin (HCC) Continue to watch carbs I diet - Bayer DCA Hb A1c  Waived - Microalbumin / creatinine urine ratio  3. Hyperlipidemia associated with type 2 diabetes mellitus (HCC) Low fat diet - Lipid panel  4. CAD S/P percutaneous coronary angioplasty  5. Bilateral carotid artery stenosis Ordered doppler study - US Carotid Duplex Bilateral; Future  6. Chronic obstructive pulmonary disease, unspecified COPD type (HCC) Do not start smoking again  7. Recurrent major depressive disorder, in full remission (HCC) Thinks will do better if she can sleep Continue lexapro  8. Generalized anxiety disorder Will have to have a negative drug screen in order to get xanax back  9. Restless leg syndrome Keep legs warm at night  10. Primary insomnia Will try doxepin and see if will help her sleep - doxepin (SINEQUAN) 10 MG/ML solution; Take 1 mL (10 mg total) by mouth at bedtime.  Dispense: 30 mL; Refill: 5  11. Vitamin D deficiency Continue daily vitamin d supplement  12. Morbid obesity (HCC) Discussed diet and exercise for person with BMI >25 Will recheck weight in 3-6 months   13. Continuous leakage of urine Added detrol 2 mg daily- will take several s\weeks to work  14. Thyroid nodule Will talk once ultra sound report is back. - US THYROID; Future   Labs pending Health Maintenance reviewed Diet and exercise encouraged  Follow up plan: 3 months   [name] [name], FNP

## 2023-05-17 NOTE — Patient Instructions (Signed)
Insomnia Insomnia is a sleep disorder that makes it difficult to fall asleep or stay asleep. Insomnia can cause fatigue, low energy, difficulty concentrating, mood swings, and poor performance at work or school. There are three different ways to classify insomnia: Difficulty falling asleep. Difficulty staying asleep. Waking up too early in the morning. Any type of insomnia can be long-term (chronic) or short-term (acute). Both are common. Short-term insomnia usually lasts for 3 months or less. Chronic insomnia occurs at least three times a week for longer than 3 months. What are the causes? Insomnia may be caused by another condition, situation, or substance, such as: Having certain mental health conditions, such as anxiety and depression. Using caffeine, alcohol, tobacco, or drugs. Having gastrointestinal conditions, such as gastroesophageal reflux disease (GERD). Having certain medical conditions. These include: Asthma. Alzheimer's disease. Stroke. Chronic pain. An overactive thyroid gland (hyperthyroidism). Other sleep disorders, such as restless legs syndrome and sleep apnea. Menopause. Sometimes, the cause of insomnia may not be known. What increases the risk? Risk factors for insomnia include: Gender. Females are affected more often than males. Age. Insomnia is more common as people get older. Stress and certain medical and mental health conditions. Lack of exercise. Having an irregular work schedule. This may include working night shifts and traveling between different time zones. What are the signs or symptoms? If you have insomnia, the main symptom is having trouble falling asleep or having trouble staying asleep. This may lead to other symptoms, such as: Feeling tired or having low energy. Feeling nervous about going to sleep. Not feeling rested in the morning. Having trouble concentrating. Feeling irritable, anxious, or depressed. How is this diagnosed? This condition  may be diagnosed based on: Your symptoms and medical history. Your health care provider may ask about: Your sleep habits. Any medical conditions you have. Your mental health. A physical exam. How is this treated? Treatment for insomnia depends on the cause. Treatment may focus on treating an underlying condition that is causing the insomnia. Treatment may also include: Medicines to help you sleep. Counseling or therapy. Lifestyle adjustments to help you sleep better. Follow these instructions at home: Eating and drinking  Limit or avoid alcohol, caffeinated beverages, and products that contain nicotine and tobacco, especially close to bedtime. These can disrupt your sleep. Do not eat a large meal or eat spicy foods right before bedtime. This can lead to digestive discomfort that can make it hard for you to sleep. Sleep habits  Keep a sleep diary to help you and your health care provider figure out what could be causing your insomnia. Write down: When you sleep. When you wake up during the night. How well you sleep and how rested you feel the next day. Any side effects of medicines you are taking. What you eat and drink. Make your bedroom a dark, comfortable place where it is easy to fall asleep. Put up shades or blackout curtains to block light from outside. Use a white noise machine to block noise. Keep the temperature cool. Limit screen use before bedtime. This includes: Not watching TV. Not using your smartphone, tablet, or computer. Stick to a routine that includes going to bed and waking up at the same times every day and night. This can help you fall asleep faster. Consider making a quiet activity, such as reading, part of your nighttime routine. Try to avoid taking naps during the day so that you sleep better at night. Get out of bed if you are still awake after   15 minutes of trying to sleep. Keep the lights down, but try reading or doing a quiet activity. When you feel  sleepy, go back to bed. General instructions Take over-the-counter and prescription medicines only as told by your health care provider. Exercise regularly as told by your health care provider. However, avoid exercising in the hours right before bedtime. Use relaxation techniques to manage stress. Ask your health care provider to suggest some techniques that may work well for you. These may include: Breathing exercises. Routines to release muscle tension. Visualizing peaceful scenes. Make sure that you drive carefully. Do not drive if you feel very sleepy. Keep all follow-up visits. This is important. Contact a health care provider if: You are tired throughout the day. You have trouble in your daily routine due to sleepiness. You continue to have sleep problems, or your sleep problems get worse. Get help right away if: You have thoughts about hurting yourself or someone else. Get help right away if you feel like you may hurt yourself or others, or have thoughts about taking your own life. Go to your nearest emergency room or: Call 911. Call the National Suicide Prevention Lifeline at 1-800-273-8255 or 988. This is open 24 hours a day. Text the Crisis Text Line at 741741. Summary Insomnia is a sleep disorder that makes it difficult to fall asleep or stay asleep. Insomnia can be long-term (chronic) or short-term (acute). Treatment for insomnia depends on the cause. Treatment may focus on treating an underlying condition that is causing the insomnia. Keep a sleep diary to help you and your health care provider figure out what could be causing your insomnia. This information is not intended to replace advice given to you by your health care provider. Make sure you discuss any questions you have with your health care provider. Document Revised: 06/23/2021 Document Reviewed: 06/23/2021 Elsevier Patient Education  2024 [employer]  

## 2023-05-18 LAB — CMP14+EGFR
ALT: 14 [IU]/L (ref 0–32)
AST: 13 [IU]/L (ref 0–40)
Albumin: 4.2 g/dL (ref 3.9–4.9)
Alkaline Phosphatase: 99 [IU]/L (ref 44–121)
BUN/Creatinine Ratio: 15 (ref 12–28)
BUN: 15 mg/dL (ref 8–27)
Bilirubin Total: 0.3 mg/dL (ref 0.0–1.2)
CO2: 25 mmol/L (ref 20–29)
Calcium: 9.7 mg/dL (ref 8.7–10.3)
Chloride: 103 mmol/L (ref 96–106)
Creatinine, Ser: 1.02 mg/dL — ABNORMAL HIGH (ref 0.57–1.00)
Globulin, Total: 2.3 g/dL (ref 1.5–4.5)
Glucose: 165 mg/dL — ABNORMAL HIGH (ref 70–99)
Potassium: 4.5 mmol/L (ref 3.5–5.2)
Sodium: 144 mmol/L (ref 134–144)
Total Protein: 6.5 g/dL (ref 6.0–8.5)
eGFR: 61 mL/min/{1.73_m2} (ref >59)

## 2023-05-18 LAB — CBC WITH DIFFERENTIAL/PLATELET
Basophils Absolute: 0.1 10*3/uL (ref 0.0–0.2)
Basos: 1 %
EOS (ABSOLUTE): 0.3 10*3/uL (ref 0.0–0.4)
Eos: 2 %
Hematocrit: 44.2 % (ref 34.0–46.6)
Hemoglobin: 13.9 g/dL (ref 11.1–15.9)
Immature Grans (Abs): 0.1 10*3/uL (ref 0.0–0.1)
Immature Granulocytes: 1 %
Lymphocytes Absolute: 1.1 10*3/uL (ref 0.7–3.1)
Lymphs: 9 %
MCH: 27.9 pg (ref 26.6–33.0)
MCHC: 31.4 g/dL — ABNORMAL LOW (ref 31.5–35.7)
MCV: 89 fL (ref 79–97)
Monocytes Absolute: 0.7 10*3/uL (ref 0.1–0.9)
Monocytes: 5 %
Neutrophils Absolute: 10.3 10*3/uL — ABNORMAL HIGH (ref 1.4–7.0)
Neutrophils: 82 %
Platelets: 241 10*3/uL (ref 150–450)
RBC: 4.99 x10E6/uL (ref 3.77–5.28)
RDW: 14.4 % (ref 11.7–15.4)
WBC: 12.5 10*3/uL — ABNORMAL HIGH (ref 3.4–10.8)

## 2023-05-18 LAB — LIPID PANEL
Chol/HDL Ratio: 3.7 ratio (ref 0.0–4.4)
Cholesterol, Total: 148 mg/dL (ref 100–199)
HDL: 40 mg/dL (ref >39)
LDL Chol Calc (NIH): 61 mg/dL (ref 0–99)
Triglycerides: 301 mg/dL — ABNORMAL HIGH (ref 0–149)
VLDL Cholesterol Cal: 47 mg/dL — ABNORMAL HIGH (ref 5–40)

## 2023-05-18 LAB — MICROALBUMIN / CREATININE URINE RATIO
Creatinine, Urine: 80 mg/dL
Microalb/Creat Ratio: 246 mg/g{creat} — ABNORMAL HIGH (ref 0–29)
Microalbumin, Urine: 197 ug/mL

## 2023-05-19 ENCOUNTER — Ambulatory Visit: Payer: Self-pay | Admitting: *Deleted

## 2023-05-19 NOTE — Patient Outreach (Signed)
Care Coordination   05/19/2023 Name: [name] MRN: [id number] DOB: 1956-05-13   Care Coordination Outreach Attempts:  An unsuccessful telephone outreach was attempted today to offer the patient information about available care coordination services.  Follow Up Plan:  Additional outreach attempts will be made to offer the patient care coordination information and services.   Encounter Outcome:  No Answer   Care Coordination Interventions:  No, not indicated     Sudeep Scheibel L. Noelle Penner, RN, BSN, [facility]  VBCI Care Management Coordinator  [phone]  Fax: [phone]

## 2023-05-24 ENCOUNTER — Telehealth: Payer: Self-pay

## 2023-05-24 NOTE — Telephone Encounter (Signed)
[name] (Key: [id number]) PA Case ID #: [id number] Rx #: [id number] Need Help? Call us at [phone] Status sent iconSent to Plan today Drug Tolterodine Tartrate 2MG  tablets ePA cloud logo Form [facility] Electronic PA Form

## 2023-05-25 ENCOUNTER — Other Ambulatory Visit (HOSPITAL_COMMUNITY): Payer: Self-pay

## 2023-05-25 NOTE — Telephone Encounter (Signed)
Pharmacy Patient Advocate Encounter  Received notification from [facility] that Prior Authorization for Tolterodine Tartrate 2MG  tablets has been APPROVED from 05/24/23 to 07/26/24   PA #/Case ID/Reference #:  [id number]

## 2023-05-26 ENCOUNTER — Ambulatory Visit (HOSPITAL_COMMUNITY): Payer: Medicare HMO

## 2023-06-02 ENCOUNTER — Ambulatory Visit (HOSPITAL_COMMUNITY)
Admission: RE | Admit: 2023-06-02 | Discharge: 2023-06-02 | Disposition: A | Discharge status: Home / Self Care | Payer: Medicare HMO | Source: Ambulatory Visit | Attending: Nurse Practitioner | Admitting: Nurse Practitioner

## 2023-06-02 DIAGNOSIS — R0989 Other specified symptoms and signs involving the circulatory and respiratory systems: Secondary | ICD-10-CM | POA: Diagnosis not present

## 2023-06-02 DIAGNOSIS — I6523 Occlusion and stenosis of bilateral carotid arteries: Secondary | ICD-10-CM | POA: Diagnosis not present

## 2023-06-02 DIAGNOSIS — E041 Nontoxic single thyroid nodule: Secondary | ICD-10-CM | POA: Insufficient documentation

## 2023-06-02 DIAGNOSIS — E042 Nontoxic multinodular goiter: Secondary | ICD-10-CM | POA: Diagnosis not present

## 2023-06-11 ENCOUNTER — Other Ambulatory Visit: Payer: Self-pay | Admitting: Nurse Practitioner

## 2023-06-11 DIAGNOSIS — I10 Essential (primary) hypertension: Secondary | ICD-10-CM

## 2023-06-11 DIAGNOSIS — F3342 Major depressive disorder, recurrent, in full remission: Secondary | ICD-10-CM

## 2023-06-14 DIAGNOSIS — H11422 Conjunctival edema, left eye: Secondary | ICD-10-CM | POA: Diagnosis not present

## 2023-06-14 NOTE — Progress Notes (Signed)
Ordererd fine needle biopsy of thyroid

## 2023-06-14 NOTE — Addendum Note (Signed)
Addended by: Bennie Pierini on: 06/14/2023 09:22 AM   Modules accepted: Orders

## 2023-06-15 ENCOUNTER — Telehealth: Payer: Self-pay | Admitting: Nurse Practitioner

## 2023-06-15 NOTE — Telephone Encounter (Signed)
I called Patient in regards to BX ordered, Patient was not sure why this was being ordered, She states no one had called her in reference to [place] results. Made Patient aware that I would place a message for Provider or Nurse to return call to patient with results and why BX was ordered.. Please give Patient BX Appt .[name] Appt is on 06/21/2023 at 9:00 AM at [facility] - She will need to arrive at 8:30 - NPO after midnight.

## 2023-06-15 NOTE — Telephone Encounter (Signed)
Patient aware if thyroid ultrasound results and appointment information.

## 2023-06-15 NOTE — Telephone Encounter (Signed)
Pt is returning a call from there the office there where no notes on the account.

## 2023-06-16 ENCOUNTER — Ambulatory Visit: Payer: Self-pay | Admitting: *Deleted

## 2023-06-16 ENCOUNTER — Encounter: Payer: Self-pay | Admitting: *Deleted

## 2023-06-16 NOTE — Patient Instructions (Addendum)
Visit Information  Thank you for taking time to visit with me today. Please don't hesitate to contact me if I can be of assistance to you.   Following are the goals we discussed today:   Goals Addressed             This Visit's Progress    Management of tthyroidism, diabetes, chronic kidney disease, COPD/pneumonia-care coordination services   Not on track    Interventions Today    Flowsheet Row Most Recent Value  Chronic Disease   Chronic disease during today's visit Diabetes, Chronic Kidney Disease/End Stage Renal Disease (ESRD), Hypertension (HTN), Other  [depression]  General Interventions   General Interventions Discussed/Reviewed General Interventions Reviewed, Labs, Doctor Visits, [facility]  Doctor Visits Discussed/Reviewed Doctor Visits Reviewed, PCP, Specialist  [discussed all medical providers for her listed medical conditions]  PCP/Specialist Visits Compliance with follow-up visit  Communication with PCP/Specialists  [RN CM to check on provider for 2025]  Exercise Interventions   Exercise Discussed/Reviewed Exercise Reviewed, Physical Activity  Physical Activity Discussed/Reviewed Physical Activity Reviewed, Home Exercise Program (HEP)  Education Interventions   Education Provided Provided Education  [reviewed reasons for systolic BP elevations, thyroid biopsy, feet edema, diurectic, Weighing/montior edema, Acute kidney disease/chronic kidney disease, neuropathy, local food banks, high cholestrol, pcp/specialists, major recurrent depression, medicines]  Provided Verbal Education On Nutrition, Labs, Foot Care, Blood Sugar Monitoring, Exercise, Medication, When to see the doctor, Mental Health/Coping with Illness, [facility], Other, [employer] Reviewed Hgb A1c, Kidney Function  Mental Health Interventions   Mental Health Discussed/Reviewed Mental Health Reviewed, Coping Strategies, Grief and Loss  Nutrition Interventions   Nutrition  Discussed/Reviewed Nutrition Reviewed, Fluid intake  Pharmacy Interventions   Pharmacy Dicussed/Reviewed Pharmacy Topics Reviewed, Medications and their functions  Safety Interventions   Safety Discussed/Reviewed Safety Reviewed, Home Safety  Home Safety Assistive Devices              Our next appointment is by telephone on 07/16/23 at 0915  Please call the care guide team at [phone] if you need to cancel or reschedule your appointment.   If you are experiencing a Mental Health or Behavioral Health Crisis or need someone to talk to, please call the Suicide and Crisis Lifeline: 988 call the [place] National Suicide Prevention Lifeline: (865)267-4829 or TTY: 667-609-6829 TTY 587-299-1525) to talk to a trained counselor call 1-800-273-TALK (toll free, 24 hour hotline) go to [facility] [address] [place] [phone]) call the [facility] Crisis Line: [phone] call 911   Patient verbalizes understanding of instructions and care plan provided today and agrees to view in MyChart. Active MyChart status and patient understanding of how to access instructions and care plan via MyChart confirmed with patient.     The patient has been provided with contact information for the care management team and has been advised to call with any health related questions or concerns.   Jmya Uliano L. Noelle Penner, RN, BSN, [facility]  VBCI Care Management Coordinator  [phone]  Fax: [phone]

## 2023-06-16 NOTE — Patient Outreach (Signed)
Care Coordination   Follow Up Visit Note   06/16/2023 Name: [name] MRN: [id number] DOB: [birth year]  [name] is a 67 y.o. year old female who sees Tracy Pierini, FNP for primary care. I spoke with  [name] by phone today.  What matters to the patients health and wellness today?  Biopsy on her neck, BP high, feet swelling   Most concern with why her blood pressure (BP) remains elevated. Recently she has had 2 cardiac medicines  06/15/23 168/90 Voiced understanding that systolic BP elevations is diabetes, high cholesterol, an overactive thyroid, heart valve disease, and anemia, anxiety, caffeine, or cardiovascular exercise.   Thinks up related to her diabetes but also will consider that her thyroid abnormalities is a cause  Concern with her medication list  Permission to speak with her pcp office pharmacist   Still taking diuretic feet burning on the top  To switch to [employer] in 2025  Discussed her April 2023 Acute Kidney injury during her admission for septic shock Lisinopril Neuropathy Diabetes Last HgA1c at 7.4 was 6.9   Poor sleep Xanax previously helped and  is not on it. Was prescribed Ambien but she does not prefer the side effects. Had been seen by her mental health provider but this female provider left. She was then assigned to female who she reports stated inappropriate  She has applied for medicaid and is pending  Discussed food banks [facility] [employer] [facility]   Goals Addressed             This Visit's Progress    Management of tthyroidism, diabetes, chronic kidney disease, COPD/pneumonia-care coordination services   Not on track    Interventions Today    Flowsheet Row Most Recent Value  Chronic Disease   Chronic disease during today's visit Diabetes, Chronic Kidney Disease/End Stage Renal Disease (ESRD), Hypertension (HTN), Other  [depression]  General  Interventions   General Interventions Discussed/Reviewed General Interventions Reviewed, Labs, Doctor Visits, [facility]  Doctor Visits Discussed/Reviewed Doctor Visits Reviewed, PCP, Specialist  [discussed all medical providers for her listed medical conditions]  PCP/Specialist Visits Compliance with follow-up visit  Communication with PCP/Specialists  [RN CM to check on provider for 2025]  Exercise Interventions   Exercise Discussed/Reviewed Exercise Reviewed, Physical Activity  Physical Activity Discussed/Reviewed Physical Activity Reviewed, Home Exercise Program (HEP)  Education Interventions   Education Provided Provided Education  [reviewed reasons for systolic BP elevations, thyroid biopsy, feet edema, diurectic, Weighing/montior edema, Acute kidney disease/chronic kidney disease, neuropathy, local food banks, high cholestrol, pcp/specialists, major recurrent depression, medicines]  Provided Verbal Education On Nutrition, Labs, Foot Care, Blood Sugar Monitoring, Exercise, Medication, When to see the doctor, Mental Health/Coping with Illness, [facility], Other, [employer] Reviewed Hgb A1c, Kidney Function  Mental Health Interventions   Mental Health Discussed/Reviewed Mental Health Reviewed, Coping Strategies, Grief and Loss  Nutrition Interventions   Nutrition Discussed/Reviewed Nutrition Reviewed, Fluid intake  Pharmacy Interventions   Pharmacy Dicussed/Reviewed Pharmacy Topics Reviewed, Medications and their functions  Safety Interventions   Safety Discussed/Reviewed Safety Reviewed, Home Safety  Home Safety Assistive Devices              SDOH assessments and interventions completed:  Yes  SDOH Interventions Today    Flowsheet Row Most Recent Value  SDOH Interventions   Food Insecurity Interventions Other (Comment)  [provided a list & numbers for various food banks in [place], [place], [place]]  Care Coordination  Interventions:  Yes, provided   Follow up plan: Follow up call scheduled for 07/16/23    Encounter Outcome:  Patient Visit Completed   Tracy Bradford L. Noelle Penner, RN, BSN, [facility]  VBCI Care Management Coordinator  [phone]  Fax: [phone]

## 2023-06-17 ENCOUNTER — Ambulatory Visit: Payer: Self-pay | Admitting: *Deleted

## 2023-06-17 NOTE — Patient Instructions (Signed)
Visit Information  Thank you for taking time to visit with me today. Please don't hesitate to contact me if I can be of assistance to you.   Following are the goals we discussed today:   Goals Addressed             This Visit's Progress    Management of thyroidism, diabetes, chronic kidney disease, COPD/pneumonia-care coordination services       Patient will be able to voice understanding of her medications plus interactions/contraindications Patient will seek medical services to manage worsening symptoms   Interventions Today    Flowsheet Row Most Recent Value  Chronic Disease   Chronic disease during today's visit Other  [medical services]  General Interventions   General Interventions Discussed/Reviewed Communication with  Communication with Pharmacists  [medical services, pcp pharmacist (medication managment, large number of medicines, interactions/contraindications)]              Our next appointment is by telephone on 07/16/23 at 0915  Please call the care guide team at [phone] if you need to cancel or reschedule your appointment.   If you are experiencing a Mental Health or Behavioral Health Crisis or need someone to talk to, please call the Suicide and Crisis Lifeline: 988 call the [place] National Suicide Prevention Lifeline: 772-873-7811 or TTY: 907-311-2552 TTY 564-508-3849) to talk to a trained counselor call 1-800-273-TALK (toll free, 24 hour hotline) call the [facility]: 646-051-3380 call 911   Patient verbalizes understanding of instructions and care plan provided today and agrees to view in MyChart. Active MyChart status and patient understanding of how to access instructions and care plan via MyChart confirmed with patient.     The patient has been provided with contact information for the care management team and has been advised to call with any health related questions or concerns.    Sheila Ocasio L. Noelle Penner, RN, BSN, [facility]  VBCI  Care Management Coordinator  [phone]  Fax: [phone]

## 2023-06-17 NOTE — Patient Outreach (Signed)
  Care Coordination   Follow Up Visit Note   06/17/2023 Name: OCEANNA BRIGUGLIO MRN: [id number] DOB: 1956-07-23  JANETT HAMEISTER is a 67 y.o. year old female who sees Bennie Pierini, FNP for primary care. I spoke with  [name] by phone today.  What matters to the patients health and wellness today?  Medical services/medication review (interactions/contraindications)    Goals Addressed             This Visit's Progress    Management of thyroidism, diabetes, chronic kidney disease, COPD/pneumonia-care coordination services       Patient will be able to voice understanding of her medications plus interactions/contraindications Patient will seek medical services to manage worsening symptoms   Interventions Today    Flowsheet Row Most Recent Value  Chronic Disease   Chronic disease during today's visit Other  [medical services]  General Interventions   General Interventions Discussed/Reviewed Communication with  Communication with Pharmacists  [medical services, pcp pharmacist (medication managment, large number of medicines, interactions/contraindications)]              SDOH assessments and interventions completed:  No     Care Coordination Interventions:  Yes, provided   Follow up plan: Follow up call scheduled for 07/16/23    Encounter Outcome:  Patient Visit Completed   Cala Bradford L. Noelle Penner, RN, BSN, [facility]  VBCI Care Management Coordinator  [phone]  Fax: [phone]

## 2023-06-21 ENCOUNTER — Encounter (HOSPITAL_COMMUNITY): Payer: Self-pay

## 2023-06-21 ENCOUNTER — Ambulatory Visit (HOSPITAL_COMMUNITY)
Admission: RE | Admit: 2023-06-21 | Discharge: 2023-06-21 | Disposition: A | Discharge status: Home / Self Care | Payer: Medicare HMO | Source: Ambulatory Visit | Attending: Nurse Practitioner | Admitting: Nurse Practitioner

## 2023-06-21 ENCOUNTER — Other Ambulatory Visit: Payer: Self-pay | Admitting: Nurse Practitioner

## 2023-06-21 DIAGNOSIS — E041 Nontoxic single thyroid nodule: Secondary | ICD-10-CM | POA: Diagnosis not present

## 2023-06-21 DIAGNOSIS — E1122 Type 2 diabetes mellitus with diabetic chronic kidney disease: Secondary | ICD-10-CM

## 2023-06-21 MED ORDER — LIDOCAINE HCL (PF) 2 % IJ SOLN
10.0000 mL | Freq: Once | INTRAMUSCULAR | Status: AC
  Administered 2023-06-21: 10 mL

## 2023-06-21 MED ORDER — LIDOCAINE HCL (PF) 2 % IJ SOLN
INTRAMUSCULAR | Status: AC
  Filled 2023-06-21: qty 10

## 2023-06-21 NOTE — Progress Notes (Signed)
PT tolerated thyroid biopsy procedure well today. Labs and afirma obtained and sent for pathology by Richard from ultrasound. PT left via wheelchair at discharge with no acute distress noted and verbalized understanding of discharge instructions.

## 2023-06-22 LAB — CYTOLOGY - NON PAP

## 2023-06-23 ENCOUNTER — Encounter: Payer: Self-pay | Admitting: Nurse Practitioner

## 2023-07-16 ENCOUNTER — Ambulatory Visit: Payer: Self-pay | Admitting: *Deleted

## 2023-07-16 NOTE — Patient Instructions (Signed)
Visit Information  Thank you for taking time to visit with me today. Please don't hesitate to contact me if I can be of assistance to you.   Following are the goals we discussed today:   Goals Addressed             This Visit's Progress    Management of thyroidism, diabetes, chronic kidney disease, COPD/pneumonia-care coordination services   On track    Patient will be able to voice understanding of her medications plus interactions/contraindications Patient will seek medical services to manage worsening symptoms Interventions Today    Flowsheet Row Most Recent Value  Chronic Disease   Chronic disease during today's visit Diabetes, Hypertension (HTN), Other  [biopsy results, medication contradications, family concern]  General Interventions   General Interventions Discussed/Reviewed General Interventions Reviewed, Labs, [facility], Doctor Visits, Communication with  Labs --  [RN CM unable to locate the results of recent biopsy- in basket message to pcp office requesting a call to patient about the results]  Doctor Visits Discussed/Reviewed Doctor Visits Reviewed, PCP  PCP/Specialist Visits Compliance with follow-up visit  Communication with PCP/Specialists, RN  [sent in basket to [facility] to request a return call to patient to answer any questions about her recent biopsy results.  Confirmed no interaction from office pharmacist.]  Exercise Interventions   Exercise Discussed/Reviewed Exercise Reviewed, Physical Activity, Weight Managment, Assistive device use and maintanence  [encouraged increase activity/exercise after she discussed her weight increase and not feeling "good" always]  Physical Activity Discussed/Reviewed Physical Activity Reviewed, Types of exercise  Weight Management Weight loss  Education Interventions   Education Provided Provided Education, Provided Printed Education  [mailed list of medication interactions reviewed with her today]  Provided Verbal Education  On Nutrition, Labs, Mental Health/Coping with Illness, Exercise, Medication, [facility], Development worker, community  [to change insurance coverage in 2025 to [place] healthcare]  Labs Reviewed --  [unsuccessful attempt to find recent biopsy results]  Mental Health Interventions   Mental Health Discussed/Reviewed Mental Health Reviewed, Coping Strategies, Other  [discussed her family member concern, suggestions made]  Nutrition Interventions   Nutrition Discussed/Reviewed Nutrition Reviewed, Portion sizes, Decreasing sugar intake, Fluid intake  Pharmacy Interventions   Pharmacy Dicussed/Reviewed Pharmacy Topics Reviewed, Affording Medications, Medications and their functions  [assisted patient with a review of contraindications of her list of medications Provided her with the website to use, suggested which medicines to take at what time and not with other medicines. Mailed her the discussed list of interactions]              Our next appointment is by telephone on 08/20/23 at 0915  Please call the care guide team at [phone] if you need to cancel or reschedule your appointment.   If you are experiencing a Mental Health or Behavioral Health Crisis or need someone to talk to, please call the Suicide and Crisis Lifeline: 988 call the [place] National Suicide Prevention Lifeline: 386-810-6459 or TTY: 330-256-0400 TTY 747-401-0149) to talk to a trained counselor call 1-800-273-TALK (toll free, 24 hour hotline) call the [facility]: [phone] call 911   Patient verbalizes understanding of instructions and care plan provided today and agrees to view in MyChart. Active MyChart status and patient understanding of how to access instructions and care plan via MyChart confirmed with patient.     The patient has been provided with contact information for the care management team and has been advised to call with any health related questions or concerns.   [name]  Tracy Bene, RN,  BSN, [facility]  [facility] Care Management Coordinator  [phone]  Fax: [phone]

## 2023-07-16 NOTE — Patient Outreach (Addendum)
Care Coordination   Follow Up Visit Note   07/16/2023 Name: [name] MRN: [id number] DOB: [birth year]  [name] is a 67 y.o. year old female who sees Bennie Pierini, FNP for primary care. I spoke with  [name] by phone today.  What matters to the patients health and wellness today?  Drug interactions, biopsy result, family concern   Voiced understanding of the of recommendations from a completed drug interaction review to include Always take eye drops without other medications, take metformin alone,  Leave 15-20 in between intake of Mirapex, metformin metoprolol Take Lipitor in the evenings  Requested interactions results be mailed to her  Asked questions about medical providers, insurance coverages Inquired about results of a recent biopsy Agreed for RN CM to outreach to her pcp to call her about biopsy results Voiced the understanding of the actions of tolerid, sequanin    Goals Addressed             This Visit's Progress    Management of thyroidism, diabetes, chronic kidney disease, COPD/pneumonia-care coordination services   On track    Patient will be able to voice understanding of her medications plus interactions/contraindications Patient will seek medical services to manage worsening symptoms Interventions Today    Flowsheet Row Most Recent Value  Chronic Disease   Chronic disease during today's visit Diabetes, Hypertension (HTN), Other  [biopsy results, medication contradications, family concern]  General Interventions   General Interventions Discussed/Reviewed General Interventions Reviewed, Labs, [facility], Doctor Visits, Communication with  Labs --  [RN CM unable to locate the results of recent biopsy- in basket message to pcp office requesting a call to patient about the results]  Doctor Visits Discussed/Reviewed Doctor Visits Reviewed, PCP  PCP/Specialist Visits Compliance with follow-up visit  Communication with  PCP/Specialists, RN  [sent in basket to [facility] to request a return call to patient to answer any questions about her recent biopsy results.  Confirmed no interaction from office pharmacist.]  Exercise Interventions   Exercise Discussed/Reviewed Exercise Reviewed, Physical Activity, Weight Managment, Assistive device use and maintanence  [encouraged increase activity/exercise after she discussed her weight increase and not feeling "good" always]  Physical Activity Discussed/Reviewed Physical Activity Reviewed, Types of exercise  Weight Management Weight loss  Education Interventions   Education Provided Provided Education, Provided Printed Education  [mailed list of medication interactions reviewed with her today]  Provided Verbal Education On Nutrition, Labs, Mental Health/Coping with Illness, Exercise, Medication, [facility], Development worker, community  [to change insurance coverage in 2025 to [place] healthcare]  Labs Reviewed --  [unsuccessful attempt to find recent biopsy results]  Mental Health Interventions   Mental Health Discussed/Reviewed Mental Health Reviewed, Coping Strategies, Other  [discussed her family member concern, suggestions made]  Nutrition Interventions   Nutrition Discussed/Reviewed Nutrition Reviewed, Portion sizes, Decreasing sugar intake, Fluid intake  Pharmacy Interventions   Pharmacy Dicussed/Reviewed Pharmacy Topics Reviewed, Affording Medications, Medications and their functions  [assisted patient with a review of contraindications of her list of medications Provided her with the website to use, suggested which medicines to take at what time and not with other medicines. Mailed her the discussed list of interactions]              SDOH assessments and interventions completed:  No     Care Coordination Interventions:  Yes, provided   Follow up plan: Follow up call scheduled for 07/2423 0915    Encounter Outcome:  Patient Visit Completed  Cala Bradford L. Noelle Penner,  RN,  BSN, [facility]  VBCI Care Management Coordinator  [phone]  Fax: [phone]

## 2023-07-19 ENCOUNTER — Ambulatory Visit (INDEPENDENT_AMBULATORY_CARE_PROVIDER_SITE_OTHER): Payer: Medicare HMO | Admitting: Family Medicine

## 2023-07-19 ENCOUNTER — Encounter: Payer: Self-pay | Admitting: Family Medicine

## 2023-07-19 VITALS — BP 158/70 | HR 66 | Temp 98.1°F | Ht 66.0 in | Wt 221.2 lb

## 2023-07-19 DIAGNOSIS — J441 Chronic obstructive pulmonary disease with (acute) exacerbation: Secondary | ICD-10-CM | POA: Diagnosis not present

## 2023-07-19 DIAGNOSIS — I10 Essential (primary) hypertension: Secondary | ICD-10-CM

## 2023-07-19 MED ORDER — PREDNISONE 20 MG PO TABS
40.0000 mg | ORAL_TABLET | Freq: Every day | ORAL | 0 refills | Status: AC
Start: 2023-07-19 — End: 2023-07-24

## 2023-07-19 MED ORDER — DOXYCYCLINE HYCLATE 100 MG PO TABS
100.0000 mg | ORAL_TABLET | Freq: Two times a day (BID) | ORAL | 0 refills | Status: AC
Start: 2023-07-19 — End: 2023-07-26

## 2023-07-19 NOTE — Progress Notes (Signed)
Acute Office Visit  Subjective:     Patient ID: [name], female    DOB: [birth year], 67 y.o.   MRN: [id number]  Chief Complaint  Patient presents with   Cough    Cough This is a new problem. Episode onset: 2 days. The problem has been gradually worsening. The cough is Productive of sputum (unsure of color). Associated symptoms include ear congestion, headaches, nasal congestion, a sore throat and shortness of breath (a little more than baseline). Pertinent negatives include no chest pain, chills, ear pain, fever, hemoptysis, myalgias, sweats or wheezing. Treatments tried: unly using breztri 2-3x a week. Using albuterol rarely. Mucinex. The treatment provided no relief. Her past medical history is significant for COPD and pneumonia.   Lucila Maine has been sick with a virus.   Review of Systems  Constitutional:  Negative for chills and fever.  HENT:  Positive for sore throat. Negative for ear pain.   Respiratory:  Positive for cough and shortness of breath (a little more than baseline). Negative for hemoptysis and wheezing.   Cardiovascular:  Negative for chest pain.  Musculoskeletal:  Negative for myalgias.  Neurological:  Positive for headaches.        Objective:    BP (!) 158/70   Pulse 66   Temp 98.1 F (36.7 C) (Temporal)   Ht 5\' 6"  (1.676 m)   Wt 221 lb 3.2 oz (100.3 kg)   LMP 10/17/2005   SpO2 90%   BMI 35.70 kg/m  SpO2 Readings from Last 3 Encounters:  06/21/23 92%  05/17/23 95%  04/08/23 92%      Physical Exam Vitals and nursing note reviewed.  Constitutional:      General: She is not in acute distress.    Appearance: She is obese. She is not ill-appearing, toxic-appearing or diaphoretic.  HENT:     Right Ear: Tympanic membrane, ear canal and external ear normal.     Left Ear: Tympanic membrane, ear canal and external ear normal.     Nose: Congestion present.     Mouth/Throat:     Mouth: Mucous membranes are moist.     Pharynx: Posterior  oropharyngeal erythema present. No pharyngeal swelling, oropharyngeal exudate or uvula swelling.     Tonsils: No tonsillar exudate. 1+ on the right. 1+ on the left.  Eyes:     General:        Right eye: No discharge.        Left eye: No discharge.  Cardiovascular:     Rate and Rhythm: Normal rate and regular rhythm.     Heart sounds: Normal heart sounds. No murmur heard. Pulmonary:     Effort: Pulmonary effort is normal. No respiratory distress.     Breath sounds: Normal breath sounds. No wheezing, rhonchi or rales.  Musculoskeletal:     Cervical back: Neck supple. No rigidity.     Right lower leg: No edema.     Left lower leg: No edema.  Lymphadenopathy:     Cervical: No cervical adenopathy.  Skin:    General: Skin is warm and dry.  Neurological:     General: No focal deficit present.     Mental Status: She is alert and oriented to person, place, and time.  Psychiatric:        Mood and Affect: Mood normal.     No results found for any visits on 07/19/23.      Assessment & Plan:   Jeffrey was seen today for cough.  Diagnoses and all orders for this visit:  COPD with acute exacerbation (HCC) Declined Covid, flu, RSV testing today. Will treat with doxycyline and prednisone as below. Discussed symptomatic care and return precautions.  -     doxycycline (VIBRA-TABS) 100 MG tablet; Take 1 tablet (100 mg total) by mouth 2 (two) times daily for 7 days. -     predniSONE (DELTASONE) 20 MG tablet; Take 2 tablets (40 mg total) by mouth daily with breakfast for 5 days.  Primary hypertension BP not at goal. Monitor at home and notify for elevated reading.   The patient indicates understanding of these issues and agrees with the plan.  Gabriel Earing, FNP

## 2023-07-26 ENCOUNTER — Ambulatory Visit (INDEPENDENT_AMBULATORY_CARE_PROVIDER_SITE_OTHER): Payer: Medicare HMO

## 2023-07-26 VITALS — Ht 66.0 in | Wt 221.0 lb

## 2023-07-26 DIAGNOSIS — Z0001 Encounter for general adult medical examination with abnormal findings: Secondary | ICD-10-CM | POA: Diagnosis not present

## 2023-07-26 DIAGNOSIS — Z780 Asymptomatic menopausal state: Secondary | ICD-10-CM

## 2023-07-26 DIAGNOSIS — Z0000 Encounter for general adult medical examination without abnormal findings: Secondary | ICD-10-CM

## 2023-07-26 NOTE — Patient Instructions (Signed)
[name] , Thank you for taking time to come for your Medicare Wellness Visit. I appreciate your ongoing commitment to your health goals. Please review the following plan we discussed and let me know if I can assist you in the future.   Referrals/Orders/Follow-Ups/Clinician Recommendations: Aim for 30 minutes of exercise or brisk walking, 6-8 glasses of water, and 5 servings of fruits and vegetables each day.  This is a list of the screening recommended for you and due dates:  Health Maintenance  Topic Date Due   COVID-19 Vaccine (1) Never done   Zoster (Shingles) Vaccine (1 of 2) Never done   DTaP/Tdap/Td vaccine (2 - Td or Tdap) 05/18/2011   Eye exam for diabetics  05/06/2021   DEXA scan (bone density measurement)  08/22/2021   Flu Shot  10/25/2023*   Pneumonia Vaccine (1 of 2 - PCV) 04/07/2024*   Complete foot exam   08/04/2023   Lipid (cholesterol) test  08/17/2023   Hemoglobin A1C  11/15/2023   Screening for Lung Cancer  05/02/2024   Yearly kidney function blood test for diabetes  05/16/2024   Yearly kidney health urinalysis for diabetes  05/16/2024   Medicare Annual Wellness Visit  07/25/2024   Cologuard (Stool DNA test)  04/28/2025   Mammogram  05/05/2025   Hepatitis C Screening  Completed   HPV Vaccine  Aged Out   Colon Cancer Screening  Discontinued  *Topic was postponed. The date shown is not the original due date.    Advanced directives: (ACP Link)Information on Advanced Care Planning can be found at [facility] Advance Health Care Directives Advance Health Care Directives ([email or web address]   Next Medicare Annual Wellness Visit scheduled for next year: Yes

## 2023-07-26 NOTE — Progress Notes (Signed)
Subjective:   [name] is a 67 y.o. female who presents for Medicare Annual (Subsequent) preventive examination.  Visit Complete: Virtual I connected with  [name] on 07/26/23 by a audio enabled telemedicine application and verified that I am speaking with the correct person using two identifiers.  Patient Location: Home  Provider Location: Home Office  I discussed the limitations of evaluation and management by telemedicine. The patient expressed understanding and agreed to proceed.  Vital Signs: Because this visit was a virtual/telehealth visit, some criteria may be missing or patient reported. Any vitals not documented were not able to be obtained and vitals that have been documented are patient reported.  Cardiac Risk Factors include: advanced age (>57men, >89 women);diabetes mellitus;dyslipidemia;hypertension;sedentary lifestyle     Objective:    Today's Vitals   07/26/23 1441  Weight: 221 lb (100.2 kg)  Height: 5\' 6"  (1.676 m)   Body mass index is 35.67 kg/m.     07/26/2023    2:46 PM 09/21/2022    1:18 PM 08/13/2022   12:56 PM 07/23/2022   12:10 PM 12/16/2021    4:46 PM 11/29/2021    7:53 PM 11/28/2021    9:52 AM  Advanced Directives  Does Patient Have a Medical Advance Directive? No No Yes Yes No Yes No  Type of Best boy of [place];Living will Healthcare Power of [place];Living will  Living will   Does patient want to make changes to medical advance directive?   No - Patient declined  No - Patient declined No - Patient declined   Copy of Healthcare Power of Attorney in Chart?   No - copy requested No - copy requested     Would patient like information on creating a medical advance directive? Yes (MAU/Ambulatory/Procedural Areas - Information given) No - Patient declined     No - Patient declined    Current Medications (verified) Outpatient Encounter Medications as of 07/26/2023  Medication Sig   albuterol (VENTOLIN HFA)  108 (90 Base) MCG/ACT inhaler Inhale 2 puffs into the lungs every 6 (six) hours as needed for wheezing or shortness of breath.   Alcohol Swabs (B-D SINGLE USE SWABS REGULAR) PADS check blood sugars twice daily Dx E11.9   aspirin EC 81 MG tablet Take 1 tablet (81 mg total) by mouth daily. Swallow whole.   atorvastatin (LIPITOR) 80 MG tablet Take 1 tablet (80 mg total) by mouth at bedtime.   azithromycin (ZITHROMAX) 500 MG tablet Take 500 mg by mouth daily.   Biotin 2.5 MG CAPS Take by mouth.   BREZTRI AEROSPHERE 160-9-4.8 MCG/ACT AERO INHALE 2 PUFFS INTO THE LUNGS IN THE MORNING AND AT BEDTIME.   Cholecalciferol (VITAMIN D3) 5000 units TABS Take 10,000 Units by mouth every evening.   cloNIDine (CATAPRES) 0.3 MG tablet Take 1 tablet (0.3 mg total) by mouth 2 (two) times daily.   doxycycline (VIBRA-TABS) 100 MG tablet Take 1 tablet (100 mg total) by mouth 2 (two) times daily for 7 days.   escitalopram (LEXAPRO) 20 MG tablet TAKE 1 TABLET (20 MG TOTAL) BY MOUTH DAILY.   fish oil-omega-3 fatty acids 1000 MG capsule Take 1,000 mg by mouth at bedtime.   gabapentin (NEURONTIN) 100 MG capsule Take 1 capsule by mouth at bedtime   lisinopril (ZESTRIL) 40 MG tablet Take 1 tablet (40 mg total) by mouth daily.   metFORMIN (GLUCOPHAGE) 500 MG tablet TAKE 1 TABLET TWICE DAILY WITH MEALS   metoprolol tartrate (LOPRESSOR) 25 MG  tablet TAKE 1 TABLET (25 MG TOTAL) BY MOUTH 2 (TWO) TIMES DAILY.   metoprolol tartrate (LOPRESSOR) 25 MG tablet Take by mouth.   pramipexole (MIRAPEX) 1 MG tablet Take 1 tablet (1 mg total) by mouth in the morning and at bedtime.   tobramycin-dexamethasone (TOBRADEX) ophthalmic solution 1 drop every 6 (six) hours.   vitamin B-12 (CYANOCOBALAMIN) 1000 MCG tablet Take 1 tablet (1,000 mcg total) by mouth daily.   doxepin (SINEQUAN) 10 MG/ML solution Take 1 mL (10 mg total) by mouth at bedtime. (Patient not taking: Reported on 07/26/2023)   tolterodine (DETROL) 2 MG tablet Take 1 tablet (2 mg  total) by mouth 2 (two) times daily. (Patient not taking: Reported on 07/26/2023)   No facility-administered encounter medications on file as of 07/26/2023.    Allergies (verified) Food, Nutmeg oil (myristica oil), Other, and Amlodipine   History: Past Medical History:  Diagnosis Date   Anxiety    Breast cancer (HCC) 06/30/2011   inv ductal, ER/PR +, her-2 -   CAD (coronary artery disease)    NSTEMI 4/08: OM3 occluded (PCI unsuccessful), dRCA 95% => BMS, inf HK, EF 50%;   b. MV 11/12:  IL ischemia => c.  LHC 11/12:  dLAD 70%, OM1 70-80%, then occluded (no change from 2008), dOM filled L->L collats, mRCA stent occluded, dRCA filled L->R collats, EF 55-65% => med Rx (consider PCI of RCA if refractory angina)   Carotid stenosis    dopplers 5/08: 0-30% bilateral; 10/12: 0-39% B/L ICA => f/u 04/2013   Cataract 2023   both eye   Chronic kidney disease    COPD (chronic obstructive pulmonary disease) (HCC)    Depression    Depression with anxiety    Diabetes mellitus without complication (HCC)    Emphysema of lung (HCC)    GERD (gastroesophageal reflux disease)    OTC acid reducer prn   Headache(784.0)    sinus; occ. migraines   Hx of radiation therapy 08/24/11 to 10/07/11   L breast   Hyperlipidemia    Hypertension    under control; has been on med. x 4 yrs.   Myocardial infarction [facility])    Oxygen deficiency    Intermittant   Stable angina (HCC)    a. med Rx after cath 05/2011   Stress incontinence, female    Past Surgical History:  Procedure Laterality Date   AXILLARY LYMPH NODE DISSECTION  07/31/2011   Procedure: AXILLARY LYMPH NODE DISSECTION;  Surgeon: [name], MD;  Location: [facility];  Service: General;  Laterality: Left;  left axillary sentinal node biopsy   BREAST LUMPECTOMY Left 06/30/2011   BREAST SURGERY     CARDIAC CATHETERIZATION  11/02/2006; 06/03/2011   CATARACT EXTRACTION Bilateral    CESAREAN SECTION  1987   CORONARY STENT PLACEMENT   11/02/2006   ELBOW SURGERY     right   LEFT HEART CATH AND CORONARY ANGIOGRAPHY N/A 10/07/2020   Procedure: LEFT HEART CATH AND CORONARY ANGIOGRAPHY;  Surgeon: [name], MD;  Location: [facility] INVASIVE CV [facility];  Service: Cardiovascular;  Laterality: N/A;   Family History  Problem Relation Age of Onset   Heart disease Mother    Heart disease Father    Heart attack Sister    Bipolar disorder Sister    Cancer Maternal Aunt        pt unaware of what kind   Cancer Maternal Grandmother        ovarian   Thyroid disease Daughter  Anxiety disorder Daughter    Depression Daughter    Colon cancer Neg Hx    Colon polyps Neg Hx    Esophageal cancer Neg Hx    Rectal cancer Neg Hx    Stomach cancer Neg Hx    Social History   Socioeconomic History   Marital status: Single    Spouse name: Not on file   Number of children: 1   Years of education: 12+   Highest education level: Some college, no degree  Occupational History   Occupation: retired  Tobacco Use   Smoking status: Former    Current packs/day: 0.00    Average packs/day: 1 pack/day for 45.0 years (45.0 ttl pk-yrs)    Types: Cigarettes    Start date: 11/13/1976    Quit date: 11/13/2021    Years since quitting: 1.6    Passive exposure: Past   Smokeless tobacco: Never   Tobacco comments:    Smoking Cessation Classes & Resources Offered.  Vaping Use   Vaping status: Never Used  Substance and Sexual Activity   Alcohol use: Yes    Alcohol/week: 1.0 standard drink of alcohol    Types: 1 Glasses of wine per week    Comment: occasionally   Drug use: No   Sexual activity: Not Currently    Birth control/protection: Post-menopausal  Other Topics Concern   Not on file  Social History Narrative   Lives at home alone. She has a daughter and grandchildren that she interacts with often.    Worked at the [employer]   Trauma of having her sister pass in her arms at her home    Social Drivers of Health   Financial Resource  Strain: Low Risk  (07/26/2023)   Overall Financial Resource Strain (CARDIA)    Difficulty of Paying Living Expenses: Not very hard  Food Insecurity: Food Insecurity Present (07/26/2023)   Hunger Vital Sign    Worried About Running Out of Food in the Last Year: Sometimes true    Ran Out of Food in the Last Year: Sometimes true  Transportation Needs: No Transportation Needs (07/26/2023)   PRAPARE - Administrator, Civil Service (Medical): No    Lack of Transportation (Non-Medical): No  Physical Activity: Insufficiently Active (07/26/2023)   Exercise Vital Sign    Days of Exercise per Week: 3 days    Minutes of Exercise per Session: 30 min  Stress: No Stress Concern Present (07/26/2023)   [employer] of Occupational Health - Occupational Stress Questionnaire    Feeling of Stress : Only a little  Social Connections: Moderately Isolated (07/26/2023)   Social Connection and Isolation Panel [NHANES]    Frequency of Communication with Friends and Family: More than three times a week    Frequency of Social Gatherings with Friends and Family: Three times a week    Attends Religious Services: 1 to 4 times per year    Active Member of Clubs or Organizations: No    Attends Banker Meetings: Never    Marital Status: Never married    Tobacco Counseling Counseling given: Not Answered Tobacco comments: Smoking Cessation Classes & Resources Offered.   Clinical Intake:  Pre-visit preparation completed: Yes  Pain : No/denies pain     Diabetes: Yes CBG done?: No Did pt. bring in CBG monitor from home?: No  How often do you need to have someone help you when you read instructions, pamphlets, or other written materials from your doctor or pharmacy?: 1 -  Never  Interpreter Needed?: No  Information entered by :: Kandis Fantasia LPN   Activities of Daily Living    07/26/2023    2:45 PM 09/21/2022    1:17 PM  In your present state of health, do you have any  difficulty performing the following activities:  Hearing? 0 0  Vision? 0 0  Difficulty concentrating or making decisions? 0 0  Walking or climbing stairs? 0 0  Dressing or bathing? 0 0  Doing errands, shopping? 0 0  Preparing Food and eating ? N N  Using the Toilet? N N  In the past six months, have you accidently leaked urine? N N  Do you have problems with loss of bowel control? N N  Managing your Medications? N N  Managing your Finances? N N  Housekeeping or managing your Housekeeping? N N    Patient Care Team: Bennie Pierini, FNP as PCP - General (Family Medicine) Meriam Sprague, MD (Inactive) as PCP - Cardiology (Cardiology) Kerri Perches, MD as Attending Physician (Optometry) Meriam Sprague, MD (Inactive) as Consulting Physician (Cardiology) Adam Phenix, DPM as Consulting Physician (Podiatry) Clinton Gallant, RN as [facility] Care Management Leonides Schanz, Orlie Dakin, MD as Consulting Physician (Gastroenterology) Martina Sinner, MD as Consulting Physician (Pulmonary Disease) Kerri Perches, MD as Attending Physician (Optometry)  Indicate any recent Medical Services you may have received from other than [facility] providers in the past year (date may be approximate).     Assessment:   This is a routine wellness examination for Maylie.  Hearing/Vision screen Hearing Screening - Comments:: Denies hearing difficulties   Vision Screening - Comments:: Wears rx glasses - up to date with routine eye exams with [facility]     Goals Addressed             This Visit's Progress    COMPLETED: Patient Stated       12/11/2019 AWV Goal: Keep All Scheduled Appointments  Over the next year, patient will attend all scheduled appointments with their PCP and any specialists that they see.      COMPLETED: Patient Stated       07/23/2022 AWV Goal: Keep All Scheduled Appointments  Over the next year, patient will attend all scheduled  appointments with their PCP and any specialists that they see.       Depression Screen    07/26/2023    2:44 PM 07/19/2023    8:53 AM 05/17/2023    2:03 PM 04/08/2023    2:29 PM 11/11/2022   11:13 AM 11/11/2022   10:58 AM 10/12/2022    2:06 PM  PHQ 2/9 Scores  PHQ - 2 Score 2 3 4 4 4  0 0  PHQ- 9 Score 14 15 19 17 15   0    Fall Risk    07/26/2023    2:45 PM 07/19/2023    8:52 AM 05/17/2023    2:03 PM 04/08/2023    2:29 PM 11/11/2022   10:58 AM  Fall Risk   Falls in the past year? 0 0 0 0 0  Number falls in past yr: 0      Injury with Fall? 0      Risk for fall due to : No Fall Risks      Follow up Falls prevention discussed;Education provided;Falls evaluation completed        MEDICARE RISK AT HOME: Medicare Risk at Home Any stairs in or around the home?: No If so, are there  any without handrails?: No Home free of loose throw rugs in walkways, pet beds, electrical cords, etc?: Yes Adequate lighting in your home to reduce risk of falls?: Yes Life alert?: No Use of a cane, walker or w/c?: No Grab bars in the bathroom?: Yes Shower chair or bench in shower?: No Elevated toilet seat or a handicapped toilet?: Yes  TIMED UP AND GO:  Was the test performed?  No    Cognitive Function:    02/11/2018    3:24 PM 01/29/2017    4:26 PM 04/18/2015    4:28 PM  MMSE - Mini Mental State Exam  Orientation to time 5 5 5   Orientation to Place 5 5 5   Registration 3 3 3   Attention/ Calculation 5 5 5   Recall 3 3 3   Language- name 2 objects 2 2 2   Language- repeat 1 1 1   Language- follow 3 step command 3 3 3   Language- read & follow direction 1 1 1   Write a sentence 1 1 1   Copy design 1 1 1   Total score 30 30 30         07/26/2023    2:45 PM 07/23/2022   12:17 PM 12/11/2020   10:08 AM 12/11/2019    9:56 AM  6CIT Screen  What Year? 0 points 0 points 0 points 0 points  What month? 0 points 0 points 0 points 0 points  What time? 0 points 0 points 0 points 0 points  Count back  from 20 0 points 0 points 0 points 0 points  Months in reverse 0 points 0 points 0 points 0 points  Repeat phrase 0 points 0 points 2 points 0 points  Total Score 0 points 0 points 2 points 0 points    Immunizations Immunization History  Administered Date(s) Administered   Tdap 05/17/2001    TDAP status: Due, Education has been provided regarding the importance of this vaccine. Advised may receive this vaccine at local pharmacy or Health Dept. Aware to provide a copy of the vaccination record if obtained from local pharmacy or Health Dept. Verbalized acceptance and understanding.  Flu Vaccine status: Declined, Education has been provided regarding the importance of this vaccine but patient still declined. Advised may receive this vaccine at local pharmacy or Health Dept. Aware to provide a copy of the vaccination record if obtained from local pharmacy or Health Dept. Verbalized acceptance and understanding.  Pneumococcal vaccine status: Declined,  Education has been provided regarding the importance of this vaccine but patient still declined. Advised may receive this vaccine at local pharmacy or Health Dept. Aware to provide a copy of the vaccination record if obtained from local pharmacy or Health Dept. Verbalized acceptance and understanding.   Covid-19 vaccine status: Declined, Education has been provided regarding the importance of this vaccine but patient still declined. Advised may receive this vaccine at local pharmacy or Health Dept.or vaccine clinic. Aware to provide a copy of the vaccination record if obtained from local pharmacy or Health Dept. Verbalized acceptance and understanding.  Qualifies for Shingles Vaccine? Yes   Zostavax completed No   Shingrix Completed?: No.    Education has been provided regarding the importance of this vaccine. Patient has been advised to call insurance company to determine out of pocket expense if they have not yet received this vaccine. Advised may  also receive vaccine at local pharmacy or Health Dept. Verbalized acceptance and understanding.  Screening Tests Health Maintenance  Topic Date Due   COVID-19 Vaccine (1)  Never done   Zoster Vaccines- Shingrix (1 of 2) Never done   DTaP/Tdap/Td (2 - Td or Tdap) 05/18/2011   OPHTHALMOLOGY EXAM  05/06/2021   DEXA SCAN  08/22/2021   INFLUENZA VACCINE  10/25/2023 (Originally 02/25/2023)   Pneumonia Vaccine 75+ Years old (1 of 2 - PCV) 04/07/2024 (Originally 08/22/1961)   FOOT EXAM  08/04/2023   LIPID PANEL  08/17/2023   HEMOGLOBIN A1C  11/15/2023   Lung Cancer Screening  05/02/2024   Diabetic kidney evaluation - eGFR measurement  05/16/2024   Diabetic kidney evaluation - Urine ACR  05/16/2024   Medicare Annual Wellness (AWV)  07/25/2024   Fecal DNA (Cologuard)  04/28/2025   MAMMOGRAM  05/05/2025   Hepatitis C Screening  Completed   HPV VACCINES  Aged Out   Colonoscopy  Discontinued    Health Maintenance  Health Maintenance Due  Topic Date Due   COVID-19 Vaccine (1) Never done   Zoster Vaccines- Shingrix (1 of 2) Never done   DTaP/Tdap/Td (2 - Td or Tdap) 05/18/2011   OPHTHALMOLOGY EXAM  05/06/2021   DEXA SCAN  08/22/2021    Colorectal cancer screening: Type of screening: Cologuard. Completed 04/28/22. Repeat every 3 years  Mammogram status: Completed 05/06/23. Repeat every year  Bone Density status: Ordered today. Pt provided with contact info and advised to call to schedule appt.  Lung Cancer Screening: (Low Dose CT Chest recommended if Age 44-80 years, 20 pack-year currently smoking OR have quit w/in 15years.) does qualify.   Lung Cancer Screening Referral: last 05/03/23  Additional Screening:  Hepatitis C Screening: does qualify; Completed 03/12/15  Vision Screening: Recommended annual ophthalmology exams for early detection of glaucoma and other disorders of the eye. Is the patient up to date with their annual eye exam?  Yes  Who is the provider or what is the name of  the office in which the patient attends annual eye exams? [facility] If pt is not established with a provider, would they like to be referred to a provider to establish care? No .   Dental Screening: Recommended annual dental exams for proper oral hygiene  Diabetic Foot Exam: Diabetic Foot Exam: Completed 08/03/22  Community Resource Referral / Chronic Care Management: CRR required this visit?  No   CCM required this visit?  No     Plan:     I have personally reviewed and noted the following in the patient's chart:   Medical and social history Use of alcohol, tobacco or illicit drugs  Current medications and supplements including opioid prescriptions. Patient is not currently taking opioid prescriptions. Functional ability and status Nutritional status Physical activity Advanced directives List of other physicians Hospitalizations, surgeries, and ER visits in previous 12 months Vitals Screenings to include cognitive, depression, and falls Referrals and appointments  In addition, I have reviewed and discussed with patient certain preventive protocols, quality metrics, and best practice recommendations. A written personalized care plan for preventive services as well as general preventive health recommendations were provided to patient.     Kandis Fantasia [place], [place]   16/04/9603   After Visit Summary: (MyChart) Due to this being a telephonic visit, the after visit summary with patients personalized plan was offered to patient via MyChart   Nurse Notes: No concerns at this time

## 2023-08-03 ENCOUNTER — Encounter: Payer: Self-pay | Admitting: Nurse Practitioner

## 2023-08-03 ENCOUNTER — Ambulatory Visit: Payer: Medicare Other | Admitting: Nurse Practitioner

## 2023-08-03 VITALS — BP 173/92 | HR 114 | Temp 97.6°F | Ht 66.0 in | Wt 218.0 lb

## 2023-08-03 DIAGNOSIS — J441 Chronic obstructive pulmonary disease with (acute) exacerbation: Secondary | ICD-10-CM | POA: Diagnosis not present

## 2023-08-03 MED ORDER — AZITHROMYCIN 250 MG PO TABS
ORAL_TABLET | ORAL | 0 refills | Status: DC
Start: 2023-08-03 — End: 2023-08-17

## 2023-08-03 MED ORDER — METHYLPREDNISOLONE ACETATE 80 MG/ML IJ SUSP
80.0000 mg | Freq: Once | INTRAMUSCULAR | Status: AC
Start: 2023-08-03 — End: 2023-08-03
  Administered 2023-08-03: 80 mg via INTRAMUSCULAR

## 2023-08-03 NOTE — Progress Notes (Signed)
 Subjective:    Patient ID: Merlynn DELENA Lewis, female    DOB: 1955-09-14, 68 y.o.   MRN: [id number]   Chief Complaint: Cough (Congestion she was on steroid and abx and is worse )   She was seen 2 weeks ago and was dx with bronchitis. Was given doxycycline  and steroids. Never completely went away. Has gotten worse over the last week. She has had to use her oxygen  throughout the day the last several days.   Cough This is a new problem. The current episode started 1 to 4 weeks ago. The problem has been waxing and waning. The problem occurs every few minutes. Associated symptoms include nasal congestion, rhinorrhea and shortness of breath. Nothing aggravates the symptoms. Her past medical history is significant for COPD.    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   Respiratory:  Positive for cough and shortness of breath.        Objective:   Physical Exam Constitutional:      Appearance: Normal appearance. She is obese.  Cardiovascular:     Rate and Rhythm: Normal rate and regular rhythm.     Heart  sounds: Normal heart sounds.  Pulmonary:     Effort: Pulmonary effort is normal.     Breath sounds: Normal breath sounds.     Comments: Diminished breath sounds in upper lobes Skin:    General: Skin is warm.  Neurological:     General: No focal deficit present.     Mental Status: She is alert and oriented to person, place, and time.  Psychiatric:        Mood and Affect: Mood normal.        Behavior: Behavior normal.    BP (!) 173/92   Pulse (!) 114   Temp 97.6 F (36.4 C) (Temporal)   Ht 5' 6 (1.676 m)   Wt 218 lb (98.9 kg)   LMP 10/17/2005   SpO2 92% Comment: on 2 litters of 02  BMI 35.19 kg/m         Assessment & Plan:   Merlynn DELENA Lewis in today with chief complaint of Cough (Congestion she was on steroid and abx and is worse )   1. COPD with acute exacerbation (HCC) (Primary) 1. Take meds as prescribed 2. Use a cool mist humidifier especially during the winter months and when heat has been humid. 3. Use saline nose sprays frequently 4. Saline irrigations of the nose can be very helpful if done frequently.  * 4X daily for 1 week*  * Use of a nettie pot can be  helpful with this. Follow directions with this* 5. Drink plenty of fluids 6. Keep thermostat turn down low 7.For any cough or congestion- mucinex  8. For fever or aces or pains- take tylenol  or ibuprofen appropriate for age and weight.  * for fevers greater than 101 orally you may alternate ibuprofen and tylenol  every  3 hours.    - azithromycin  (ZITHROMAX  Z-PAK) 250 MG tablet; As directed  Dispense: 6 tablet; Refill: 0 - methylPREDNISolone  acetate (DEPO-MEDROL ) injection 80 mg    The above assessment and management plan was discussed with the patient. The patient verbalized understanding of and has agreed to the management plan. Patient is aware to call the clinic if symptoms persist or worsen. Patient is aware when to return to the clinic for a follow-up visit. Patient educated on when it is appropriate  to go to the emergency department.   [name] Gladis, FNP

## 2023-08-03 NOTE — Patient Instructions (Signed)

## 2023-08-13 ENCOUNTER — Other Ambulatory Visit: Payer: Self-pay | Admitting: Nurse Practitioner

## 2023-08-13 ENCOUNTER — Telehealth: Payer: Self-pay

## 2023-08-13 MED ORDER — AMOXICILLIN-POT CLAVULANATE 875-125 MG PO TABS: 1.0000 | ORAL_TABLET | Freq: Two times a day (BID) | ORAL | 0 refills | Status: DC

## 2023-08-13 NOTE — Telephone Encounter (Signed)
This message was sent to the wrong office, Please advise

## 2023-08-13 NOTE — Telephone Encounter (Signed)
Copied from CRM [id number]. Topic: Clinical - Medical Advice >> Aug 13, 2023  7:59 AM [name] wrote: Reason for CRM: Patient finished Z pack last week and is still very congested//Patient would like to know if another prescription can be called in//Please call patient back

## 2023-08-13 NOTE — Telephone Encounter (Signed)
Called and spoke with patient. Finished Zpak 5 days ago and taking mucinex and claritin. She doesn't have any flonase. Patient sounds terrible and concerned about getting pneumonia. Spoke with PCP and Augmentin sent to pharmacy. Patient notified and verbalized understanding

## 2023-08-13 NOTE — Telephone Encounter (Signed)
Z pak stays in system for 10 days even though only take for 5. Another antibiotic is not going to help. Continue flonase and add clartiin at night

## 2023-08-17 ENCOUNTER — Encounter: Payer: Self-pay | Admitting: Nurse Practitioner

## 2023-08-17 ENCOUNTER — Ambulatory Visit: Payer: Medicare Other | Admitting: Nurse Practitioner

## 2023-08-17 ENCOUNTER — Ambulatory Visit (INDEPENDENT_AMBULATORY_CARE_PROVIDER_SITE_OTHER): Payer: Medicare Other

## 2023-08-17 VITALS — BP 190/79 | HR 68 | Temp 98.1°F | Ht 66.0 in | Wt 220.0 lb

## 2023-08-17 DIAGNOSIS — F411 Generalized anxiety disorder: Secondary | ICD-10-CM

## 2023-08-17 DIAGNOSIS — E559 Vitamin D deficiency, unspecified: Secondary | ICD-10-CM

## 2023-08-17 DIAGNOSIS — Z79891 Long term (current) use of opiate analgesic: Secondary | ICD-10-CM | POA: Diagnosis not present

## 2023-08-17 DIAGNOSIS — I2510 Atherosclerotic heart disease of native coronary artery without angina pectoris: Secondary | ICD-10-CM

## 2023-08-17 DIAGNOSIS — E1169 Type 2 diabetes mellitus with other specified complication: Secondary | ICD-10-CM | POA: Diagnosis not present

## 2023-08-17 DIAGNOSIS — J449 Chronic obstructive pulmonary disease, unspecified: Secondary | ICD-10-CM

## 2023-08-17 DIAGNOSIS — F5101 Primary insomnia: Secondary | ICD-10-CM

## 2023-08-17 DIAGNOSIS — Z780 Asymptomatic menopausal state: Secondary | ICD-10-CM

## 2023-08-17 DIAGNOSIS — E1122 Type 2 diabetes mellitus with diabetic chronic kidney disease: Secondary | ICD-10-CM

## 2023-08-17 DIAGNOSIS — N182 Chronic kidney disease, stage 2 (mild): Secondary | ICD-10-CM | POA: Diagnosis not present

## 2023-08-17 DIAGNOSIS — E785 Hyperlipidemia, unspecified: Secondary | ICD-10-CM | POA: Diagnosis not present

## 2023-08-17 DIAGNOSIS — I6523 Occlusion and stenosis of bilateral carotid arteries: Secondary | ICD-10-CM | POA: Diagnosis not present

## 2023-08-17 DIAGNOSIS — I10 Essential (primary) hypertension: Secondary | ICD-10-CM | POA: Diagnosis not present

## 2023-08-17 DIAGNOSIS — F3342 Major depressive disorder, recurrent, in full remission: Secondary | ICD-10-CM

## 2023-08-17 DIAGNOSIS — G2581 Restless legs syndrome: Secondary | ICD-10-CM

## 2023-08-17 LAB — BAYER DCA HB A1C WAIVED: HB A1C (BAYER DCA - WAIVED): 7.5 % — ABNORMAL HIGH (ref 4.8–5.6)

## 2023-08-17 LAB — LIPID PANEL

## 2023-08-17 MED ORDER — METFORMIN HCL 500 MG PO TABS: 500.0000 mg | ORAL_TABLET | Freq: Two times a day (BID) | ORAL | 1 refills | Status: DC

## 2023-08-17 MED ORDER — HYDROCHLOROTHIAZIDE 25 MG PO TABS
25.0000 mg | ORAL_TABLET | Freq: Every day | ORAL | 3 refills | Status: DC
Start: 2023-08-17 — End: 2023-11-05

## 2023-08-17 MED ORDER — ATORVASTATIN CALCIUM 80 MG PO TABS: 80.0000 mg | ORAL_TABLET | Freq: Every day | ORAL | 1 refills | Status: DC

## 2023-08-17 NOTE — Progress Notes (Signed)
Subjective:    Patient ID: [name], female    DOB: 10/23/1955, 68 y.o.   MRN: [id number]   Chief Complaint: medical management of chronic issues     HPI:  [name] is a 68 y.o. who identifies as a female who was assigned female at birth.   Social history: Lives with: by herself Work history: disability   Comes in today for follow up of the following chronic medical issues:  1. Primary hypertension No c/o chest pain, sob or headache. We added lisinopril to meda at last visit. Does check blood pressure at home. Blood pressure runs 150 systolic BP Readings from Last 3 Encounters:  08/03/23 (!) 173/92  07/19/23 (!) 158/70  06/21/23 (!) 167/89     2. Type 2 diabetes mellitus with stage 2 chronic kidney disease, without long-term current use of insulin (HCC) Se does not check her blood sugar at home. Lab Results  Component Value Date   HGBA1C 7.4 (H) 05/17/2023     3. Hyperlipidemia associated with type 2 diabetes mellitus (HCC) Does try to watch diet but does no exercises at all. Lab Results  Component Value Date   CHOL 148 05/17/2023   HDL 40 05/17/2023   LDLCALC 61 05/17/2023   LDLDIRECT 100.0 05/16/2007   TRIG 301 (H) 05/17/2023   CHOLHDL 3.7 05/17/2023     4. CAD S/P percutaneous coronary angioplasty Has not seen cardiology in awhile  5. Bilateral carotid artery stenosis Last carotid doppler study was done in 2020. Showed minor stenosis.  6. Chronic obstructive pulmonary disease, unspecified COPD type (HCC) Quit smoking 2 years ago. Only inhaler is albuterol which she uses occasionally. Had recent low dose CT scan which was negative for malignancy.  7. Recurrent major depressive disorder, in full remission (HCC) Is on lexapro and is doing about the same. She never wants to change her meds    07/26/2023    2:44 PM 07/19/2023    8:53 AM 05/17/2023    2:03 PM  Depression screen PHQ 2/9  Decreased Interest 1 2 2   Down, Depressed, Hopeless  1 1 2   PHQ - 2 Score 2 3 4   Altered sleeping 3 3 3   Tired, decreased energy 3 3 3   Change in appetite 2 2 3   Feeling bad or failure about yourself  2 2 2   Trouble concentrating 1 1 2   Moving slowly or fidgety/restless 1 1 1   Suicidal thoughts 0 0 1  PHQ-9 Score 14 15 19   Difficult doing work/chores  Very difficult Very difficult     8. Generalized anxiety disorder Is on no antianxiety meds other than lexapro. She failed a drug test so we stopped her xanax. She says her anxiety is from not sleeping. She has still been smoking marijuana.    07/19/2023    8:53 AM 05/17/2023    2:03 PM 04/08/2023    2:30 PM 11/11/2022   11:13 AM  GAD 7 : Generalized Anxiety Score  Nervous, Anxious, on Edge 2 3 2 2   Control/stop worrying 1 2 2 1   Worry too much - different things 2 2 2 1   Trouble relaxing 2 3 2 2   Restless 1 2 1 1   Easily annoyed or irritable 3 3 1 1   Afraid - awful might happen 1 2 2 2   Total GAD 7 Score 12 17 12 10   Anxiety Difficulty Very difficult Very difficult Somewhat difficult Somewhat difficult      9. Restless leg syndrome  Is on mirapex and that is working well for her.  10. Primary insomnia We tried [place] and that made her "bonkers". Only sleeping about 2-4 hours at a time. She says trazadone did not work either.  11. Vitamin D deficiency Is on daily vitamin d supplement  12. Morbid obesity (HCC) No recent weight changes  Wt Readings from Last 3 Encounters:  08/17/23 220 lb (99.8 kg)  08/03/23 218 lb (98.9 kg)  07/26/23 221 lb (100.2 kg)   BMI Readings from Last 3 Encounters:  08/17/23 35.51 kg/m  08/03/23 35.19 kg/m  07/26/23 35.67 kg/m       New complaints: Still not sleeping. Cannot afford doxepin. Ambien did not work for her.   Allergies  Allergen Reactions   Food Other (See Comments)    Nutmeg= difficulty breathing   Nutmeg Oil (Myristica Oil) Hives   Other Other (See Comments)    Nutmeg= difficulty breathing   Amlodipine Swelling    Outpatient Encounter Medications as of 08/17/2023  Medication Sig   albuterol (VENTOLIN HFA) 108 (90 Base) MCG/ACT inhaler Inhale 2 puffs into the lungs every 6 (six) hours as needed for wheezing or shortness of breath.   Alcohol Swabs (B-D SINGLE USE SWABS REGULAR) PADS check blood sugars twice daily Dx E11.9   amoxicillin-clavulanate (AUGMENTIN) 875-125 MG tablet Take 1 tablet by mouth 2 (two) times daily.   aspirin EC 81 MG tablet Take 1 tablet (81 mg total) by mouth daily. Swallow whole.   atorvastatin (LIPITOR) 80 MG tablet Take 1 tablet (80 mg total) by mouth at bedtime.   azithromycin (ZITHROMAX Z-PAK) 250 MG tablet As directed   Biotin 2.5 MG CAPS Take by mouth.   BREZTRI AEROSPHERE 160-9-4.8 MCG/ACT AERO INHALE 2 PUFFS INTO THE LUNGS IN THE MORNING AND AT BEDTIME.   Cholecalciferol (VITAMIN D3) 5000 units TABS Take 10,000 Units by mouth every evening.   cloNIDine (CATAPRES) 0.3 MG tablet Take 1 tablet (0.3 mg total) by mouth 2 (two) times daily.   doxepin (SINEQUAN) 10 MG/ML solution Take 1 mL (10 mg total) by mouth at bedtime.   escitalopram (LEXAPRO) 20 MG tablet TAKE 1 TABLET (20 MG TOTAL) BY MOUTH DAILY.   fish oil-omega-3 fatty acids 1000 MG capsule Take 1,000 mg by mouth at bedtime.   gabapentin (NEURONTIN) 100 MG capsule Take 1 capsule by mouth at bedtime   lisinopril (ZESTRIL) 40 MG tablet Take 1 tablet (40 mg total) by mouth daily.   metFORMIN (GLUCOPHAGE) 500 MG tablet TAKE 1 TABLET TWICE DAILY WITH MEALS   metoprolol tartrate (LOPRESSOR) 25 MG tablet TAKE 1 TABLET (25 MG TOTAL) BY MOUTH 2 (TWO) TIMES DAILY.   metoprolol tartrate (LOPRESSOR) 25 MG tablet Take by mouth.   pramipexole (MIRAPEX) 1 MG tablet Take 1 tablet (1 mg total) by mouth in the morning and at bedtime.   tobramycin-dexamethasone (TOBRADEX) ophthalmic solution 1 drop every 6 (six) hours.   tolterodine (DETROL) 2 MG tablet Take 1 tablet (2 mg total) by mouth 2 (two) times daily.   vitamin B-12  (CYANOCOBALAMIN) 1000 MCG tablet Take 1 tablet (1,000 mcg total) by mouth daily.   No facility-administered encounter medications on file as of 08/17/2023.    Past Surgical History:  Procedure Laterality Date   AXILLARY LYMPH NODE DISSECTION  07/31/2011   Procedure: AXILLARY LYMPH NODE DISSECTION;  Surgeon: [name], MD;  Location: [facility];  Service: General;  Laterality: Left;  left axillary sentinal node biopsy   BREAST  LUMPECTOMY Left 06/30/2011   BREAST SURGERY     CARDIAC CATHETERIZATION  11/02/2006; 06/03/2011   CATARACT EXTRACTION Bilateral    CESAREAN SECTION  1987   CORONARY STENT PLACEMENT  11/02/2006   ELBOW SURGERY     right   LEFT HEART CATH AND CORONARY ANGIOGRAPHY N/A 10/07/2020   Procedure: LEFT HEART CATH AND CORONARY ANGIOGRAPHY;  Surgeon: [name], MD;  [facility];  Service: Cardiovascular;  Laterality: N/A;    Family History  Problem Relation Age of Onset   Heart disease Mother    Heart disease Father    Heart attack Sister    Bipolar disorder Sister    Cancer Maternal Aunt        pt unaware of what kind   Cancer Maternal Grandmother        ovarian   Thyroid disease Daughter    Anxiety disorder Daughter    Depression Daughter    Colon cancer Neg Hx    Colon polyps Neg Hx    Esophageal cancer Neg Hx    Rectal cancer Neg Hx    Stomach cancer Neg Hx       Controlled substance contract: n/a     Review of Systems  Constitutional:  Negative for diaphoresis.  Eyes:  Negative for pain.  Respiratory:  Negative for shortness of breath.   Cardiovascular:  Negative for chest pain, palpitations and leg swelling.  Gastrointestinal:  Negative for abdominal pain.  Endocrine: Negative for polydipsia.  Skin:  Negative for rash.  Neurological:  Negative for dizziness, weakness and headaches.  Hematological:  Does not bruise/bleed easily.  All other systems reviewed and are negative.      Objective:    Physical Exam Vitals and nursing note reviewed.  Constitutional:      General: She is not in acute distress.    Appearance: Normal appearance. She is well-developed.  HENT:     Head: Normocephalic.     Right Ear: Tympanic membrane normal.     Left Ear: Tympanic membrane normal.     Nose: Nose normal.     Mouth/Throat:     Mouth: Mucous membranes are moist.  Eyes:     Pupils: Pupils are equal, round, and reactive to light.  Neck:     Vascular: No carotid bruit or JVD.  Cardiovascular:     Rate and Rhythm: Normal rate and regular rhythm.     Heart sounds: Normal heart sounds.  Pulmonary:     Effort: Pulmonary effort is normal. No respiratory distress.     Breath sounds: Normal breath sounds. No wheezing or rales.  Chest:     Chest wall: No tenderness.  Abdominal:     General: Bowel sounds are normal. There is no distension or abdominal bruit.     Palpations: Abdomen is soft. There is no hepatomegaly, splenomegaly, mass or pulsatile mass.     Tenderness: There is no abdominal tenderness.  Musculoskeletal:        General: Normal range of motion.     Cervical back: Normal range of motion and neck supple.  Lymphadenopathy:     Cervical: No cervical adenopathy.  Skin:    General: Skin is warm and dry.  Neurological:     Mental Status: She is alert and oriented to person, place, and time.     Deep Tendon Reflexes: Reflexes are normal and symmetric.  Psychiatric:        Behavior: Behavior normal.  Thought Content: Thought content normal.        Judgment: Judgment normal.    LMP 10/17/2005   BP (!) 190/79   Pulse 68   Temp 98.1 F (36.7 C) (Temporal)   Ht 5\' 6"  (1.676 m)   Wt 220 lb (99.8 kg)   LMP 10/17/2005   SpO2 90%   BMI 35.51 kg/m     Hgba1c 7.5%    Assessment & Plan:  [name] comes in today with chief complaint of No chief complaint on file.   Diagnosis and orders addressed:  1. Primary hypertension Low sodium diet Added  hydrochlorothiazide to meds  Continue to check blood pressure at home - CBC with Differential/Platelet - CMP14+EGFR - lisinopril (ZESTRIL) 40 MG tablet; Take 1 tablet (40 mg total) by mouth daily.  Dispense: 90 tablet; Refill: 3 - hydrochlorothiazide 25mg  1 po daily #90 1 refill  2. Type 2 diabetes mellitus with stage 2 chronic kidney disease, without long-term current use of insulin (HCC) Continue to watch carbs I diet - Bayer DCA Hb A1c Waived - Microalbumin / creatinine urine ratio  3. Hyperlipidemia associated with type 2 diabetes mellitus (HCC) Low fat diet - Lipid panel  4. CAD S/P percutaneous coronary angioplasty  5. Bilateral carotid artery stenosis Ordered doppler study - US Carotid Duplex Bilateral; Future  6. Chronic obstructive pulmonary disease, unspecified COPD type (HCC) Do not start smoking again  7. Recurrent major depressive disorder, in full remission (HCC) Thinks will do better if she can sleep Continue lexapro  8. Generalized anxiety disorder Will have to have a negative drug screen in order to get xanax back  9. Restless leg syndrome Keep legs warm at night  10. Primary insomnia Will try doxepin and see if will help her sleep - doxepin (SINEQUAN) 10 MG/ML solution; Take 1 mL (10 mg total) by mouth at bedtime.  Dispense: 30 mL; Refill: 5  11. Vitamin D deficiency Continue daily vitamin d supplement  12. Morbid obesity (HCC) Discussed diet and exercise for person with BMI >25 Will recheck weight in 3-6 months   13. Continuous leakage of urine Added detrol 2 mg daily- will take several s\weeks to work  14. Thyroid nodule Will talk once ultra sound report is back. - US THYROID; Future   Labs pending Health Maintenance reviewed Diet and exercise encouraged  Follow up plan: 3 months   [name] [name], FNP

## 2023-08-17 NOTE — Patient Instructions (Signed)

## 2023-08-18 LAB — CBC WITH DIFFERENTIAL/PLATELET
Basophils Absolute: 0.1 10*3/uL (ref 0.0–0.2)
Basos: 1 %
EOS (ABSOLUTE): 0.2 10*3/uL (ref 0.0–0.4)
Eos: 2 %
Hematocrit: 42.5 % (ref 34.0–46.6)
Hemoglobin: 13.1 g/dL (ref 11.1–15.9)
Immature Grans (Abs): 0.1 10*3/uL (ref 0.0–0.1)
Immature Granulocytes: 1 %
Lymphocytes Absolute: 1 10*3/uL (ref 0.7–3.1)
Lymphs: 10 %
MCH: 26.8 pg (ref 26.6–33.0)
MCHC: 30.8 g/dL — ABNORMAL LOW (ref 31.5–35.7)
MCV: 87 fL (ref 79–97)
Monocytes Absolute: 0.4 10*3/uL (ref 0.1–0.9)
Monocytes: 4 %
Neutrophils Absolute: 8.4 10*3/uL — ABNORMAL HIGH (ref 1.4–7.0)
Neutrophils: 82 %
Platelets: 269 10*3/uL (ref 150–450)
RBC: 4.89 x10E6/uL (ref 3.77–5.28)
RDW: 14 % (ref 11.7–15.4)
WBC: 10.1 10*3/uL (ref 3.4–10.8)

## 2023-08-18 LAB — CMP14+EGFR
ALT: 14 IU/L (ref 0–32)
AST: 13 IU/L (ref 0–40)
Albumin: 3.9 g/dL (ref 3.9–4.9)
Alkaline Phosphatase: 98 IU/L (ref 44–121)
BUN/Creatinine Ratio: 14 (ref 12–28)
BUN: 16 mg/dL (ref 8–27)
Bilirubin Total: 0.2 mg/dL (ref 0.0–1.2)
CO2: 25 mmol/L (ref 20–29)
Calcium: 9.4 mg/dL (ref 8.7–10.3)
Chloride: 103 mmol/L (ref 96–106)
Creatinine, Ser: 1.12 mg/dL — ABNORMAL HIGH (ref 0.57–1.00)
Globulin, Total: 2.4 g/dL (ref 1.5–4.5)
Glucose: 173 mg/dL — ABNORMAL HIGH (ref 70–99)
Potassium: 4.7 mmol/L (ref 3.5–5.2)
Sodium: 143 mmol/L (ref 134–144)
Total Protein: 6.3 g/dL (ref 6.0–8.5)
eGFR: 54 mL/min/{1.73_m2} — ABNORMAL LOW (ref >59)

## 2023-08-18 LAB — LIPID PANEL
Cholesterol, Total: 150 mg/dL (ref 100–199)
HDL: 46 mg/dL (ref >39)
LDL CALC COMMENT:: 3.3 ratio (ref 0.0–4.4)
LDL Chol Calc (NIH): 78 mg/dL (ref 0–99)
Triglycerides: 148 mg/dL (ref 0–149)
VLDL Cholesterol Cal: 26 mg/dL (ref 5–40)

## 2023-08-19 DIAGNOSIS — Z780 Asymptomatic menopausal state: Secondary | ICD-10-CM | POA: Diagnosis not present

## 2023-08-19 LAB — TOXASSURE SELECT 13 (MW), URINE

## 2023-08-19 MED ORDER — ALPRAZOLAM 0.25 MG PO TABS
0.2500 mg | ORAL_TABLET | Freq: Every evening | ORAL | 0 refills | Status: DC | PRN
Start: 2023-08-19 — End: 2023-11-05

## 2023-08-19 NOTE — Addendum Note (Signed)
Addended by: Bennie Pierini on: 08/19/2023 02:45 PM   Modules accepted: Orders

## 2023-08-20 ENCOUNTER — Ambulatory Visit: Payer: Self-pay | Admitting: *Deleted

## 2023-08-20 ENCOUNTER — Telehealth: Payer: Self-pay | Admitting: Family Medicine

## 2023-08-20 DIAGNOSIS — F411 Generalized anxiety disorder: Secondary | ICD-10-CM

## 2023-08-20 NOTE — Telephone Encounter (Signed)
Called and spoke with patient. Notified that MMM will not increase the dose of the xanax. Patient verbalized understanding. Advised that we can refer her to psych if she wishes to do that and she does. Referral placed. Patient verbalized understanding

## 2023-08-20 NOTE — Telephone Encounter (Signed)
Called and spoke with patient and she states that she got a rx for the 0.25 from when she was in the hospital last time. She said she took them and didn't do any good. She had to take 4 before she could feel any effect and she doesn't want to take that many. She also states that she discussed this with you a month ago and she thought you both were in agreeance on the dose

## 2023-08-20 NOTE — Patient Outreach (Addendum)
Care Coordination   Follow Up Visit Note   08/20/2023 Name: [name] MRN: [id number] DOB: January 25, 1956  [name] is a 68 y.o. year old female who sees Tracy Pierini, FNP for primary care. I spoke with  [name] by phone today.  What matters to the patients health and wellness today?  On going respiratory symptoms, anxiety, housing concerns  Last pcp office visit on 08/17/23 recurrent respiratory since 2024 PMH COPD Reports her  only child a daughter has also had symptoms but she lives by herself (with her puppy) Has a pulmonologist but has not been seen in a while.  Today she reports being aware that she continues to have some sinus symptoms related to dust, home cold environment that may be also causing concerns.  She has tried the local westernization program but she has clutter in her 2-3 bedrooms that the staff were not able to get to her outlets plus. She reports this clutter is result of her plus her mother and sister items. She and her sister had moved in with their mother when their mother became sick. Discussed the cost of de cluttering staff. Not able to afford. No real neighbors and does not go to church or socialize much these days. In April 2024 she was active with [employer] Social worker, [name] for [employer], food insecurity & unsafe housing condition She now has $23 of food stamps that does not go far  Housing concerns Pipes have frozen x 2, confirms her pipes are not insulated, therefore, has been without water. Reports this is not a new occurrence for her home that her father built in 1964 or later  Anxiety management from pcp is  not a plan/dose the patient prefers. With a discussion of pcp management of pain and behavior diagnoses in 2025 & preference is for referral to specialists, she voiced concern. Patient reports not being satisfied with low dose of her anxiety information.  She agrees with RN CM to outreach to her new united  healthcare plan customer service and her local DSS staff about anxiety providers other than the [facility]  providers seen in the past.     Goals Addressed             This Visit's Progress    Management of COPD symptoms, home repairs/clutter, thyroidism, diabetes, chronic kidney disease-care coordination services       Patient will be able to voice understanding of her medications plus interactions/contraindications- 08/20/23 meeting goal Patient will seek medical services to manage worsening symptoms 08/20/23 meeting goal Patient respiratory symptoms will be managed/resolved Patient will outreach to find options for specialists (anxiety, home clutter)  Interventions Today    Flowsheet Row Most Recent Value  Chronic Disease   Chronic disease during today's visit Other, Chronic Obstructive Pulmonary Disease (COPD), Hypertension (HTN)  [Unresolved respiratory symptom January 2024 with frequent pcp Reid, anxiety management & older home clutter/repairs]  General Interventions   General Interventions Discussed/Reviewed General Interventions Reviewed, [name], [name], Communication with  [name] Discussed/Reviewed [name] Reviewed, PCP, Specialist  [RN CM discussed she will attempt to speak with pcp, Encouraged use of MD resources provided in 2024 as needed. Encouraged Reid with pulmonology, psychiatry]  PCP/Specialist Reid Compliance with follow-up visit  Communication with PCP/Specialists  [spoke in person with her pcp about voiced anxiety medication concerns- patient will continue to be encourged to seek specialist assist]  Exercise Interventions   Exercise Discussed/Reviewed Physical Activity, Exercise Reviewed  [decrease movement  throughout home related to cold environment]  Physical Activity Discussed/Reviewed Physical Activity Reviewed  Education Interventions   Education Provided Provided Education  [2025 office speciality services, grace period  for changes in new insurance plan, changes possible in new insurance plan's available specialists, Encouraged to speak with customer service of new plan for MD options, reviewed [employer] 2024 SW interventions,]  Provided Verbal Education On Mental Health/Coping with Illness, Medication, Programmer, applications, Development worker, community, Other  Mental Health Interventions   Mental Health Discussed/Reviewed Mental Health Reviewed, Coping Strategies, Anxiety  Pharmacy Interventions   Pharmacy Dicussed/Reviewed Pharmacy Topics Reviewed, Medications and their functions, Affording Medications  [Discussed use of steriods, z pax, antibiotics for repiratory symptoms. Discussed low dose anxiety medicine-referral to specialist]  Safety Interventions   Safety Discussed/Reviewed Safety Reviewed, Home Safety  Home Safety --  [discussed home/health safety for respiratory symptoms, de clutter agencies/unable to afford out of pocket cost, lack of support to remove clutter]              SDOH assessments and interventions completed:  Yes  SDOH Interventions Today    Flowsheet Row Most Recent Value  SDOH Interventions   Housing Interventions Intervention Not Indicated  Transportation Interventions Intervention Not Indicated  Financial Strain Interventions Intervention Not Indicated  Social Connections Interventions Intervention Not Indicated  Health Literacy Interventions Intervention Not Indicated        Care Coordination Interventions:  Yes, provided   Follow up plan: Follow up call scheduled for 09/20/23    Encounter Outcome:  Patient Visit Completed   Tracy Bradford L. Noelle Penner, RN, BSN, CCM [place]  [facility], [facility] RN Care Manager Direct Dial: [phone]  Fax: [phone] Mailing Address: [address]  [place] [address] Website: [email or web address]

## 2023-08-20 NOTE — Telephone Encounter (Signed)
Because you feel drug screen in the past , technically I am not suppose to give you any controlled meds according to office policy. I do not recall discussing dosages of xanax with you recently. Only remember discussing that you wanted me to prescribe again. The 0.25 dose is all I can do. Will need to see psych if want a higher dose.

## 2023-08-20 NOTE — Telephone Encounter (Signed)
Copied from CRM [id number]. Topic: Clinical - Medication Question >> Aug 19, 2023  4:33 PM [name] H wrote: Reason for CRM: the ALPRAZolam [name]) 0.25 MG tablet dosage is too low and the patient is stating that it wont do her any good please f/u

## 2023-08-20 NOTE — Patient Instructions (Addendum)
Visit Information  Thank you for taking time to visit with me today. Please don't hesitate to contact me if I can be of assistance to you.   Following are the goals we discussed today:   Goals Addressed             This Visit's Progress    Management of COPD symptoms, home repairs/clutter, thyroidism, diabetes, chronic kidney disease-care coordination services       Patient will be able to voice understanding of her medications plus interactions/contraindications- 08/20/23 meeting goal Patient will seek medical services to manage worsening symptoms 08/20/23 meeting goal Patient respiratory symptoms will be managed/resolved Patient will outreach to find options for specialists (anxiety, home clutter)  Interventions Today    Flowsheet Row Most Recent Value  Chronic Disease   Chronic disease during today's visit Other, Chronic Obstructive Pulmonary Disease (COPD), Hypertension (HTN)  [Unresolved respiratory symptom January 2024 with frequent pcp visits, anxiety management & older home clutter/repairs]  General Interventions   General Interventions Discussed/Reviewed General Interventions Reviewed, [facility], Doctor Visits, Communication with  Doctor Visits Discussed/Reviewed Doctor Visits Reviewed, PCP, Specialist  [RN CM discussed she will attempt to speak with pcp, Encouraged use of MD resources provided in 2024 as needed. Encouraged visits with pulmonology, psychiatry]  PCP/Specialist Visits Compliance with follow-up visit  Communication with PCP/Specialists  [spoke in person with her pcp about voiced anxiety medication concerns- patient will continue to be encourged to seek specialist assist]  Exercise Interventions   Exercise Discussed/Reviewed Physical Activity, Exercise Reviewed  [decrease movement throughout home related to cold environment]  Physical Activity Discussed/Reviewed Physical Activity Reviewed  Education Interventions   Education Provided Provided Education   [2025 office speciality services, grace period for changes in new insurance plan, changes possible in new insurance plan's available specialists, Encouraged to speak with customer service of new plan for MD options, reviewed VBCI 2024 SW interventions,]  Provided Verbal Education On Mental Health/Coping with Illness, Medication, Programmer, applications, Development worker, community, Other  Mental Health Interventions   Mental Health Discussed/Reviewed Mental Health Reviewed, Coping Strategies, Anxiety  Pharmacy Interventions   Pharmacy Dicussed/Reviewed Pharmacy Topics Reviewed, Medications and their functions, Affording Medications  [Discussed use of steriods, z pax, antibiotics for repiratory symptoms. Discussed low dose anxiety medicine-referral to specialist]  Safety Interventions   Safety Discussed/Reviewed Safety Reviewed, Home Safety  Home Safety --  [discussed home/health safety for respiratory symptoms, de clutter agencies/unable to afford out of pocket cost, lack of support to remove clutter]              Our next appointment is by telephone on 09/20/23 at 1015  Please call the care guide team at [phone] if you need to cancel or reschedule your appointment.   If you are experiencing a Mental Health or Behavioral Health Crisis or need someone to talk to, please call the Suicide and Crisis Lifeline: 988 call the [place] National Suicide Prevention Lifeline: 4352950972 or TTY: (312) 342-4695 TTY 252-417-4510) to talk to a trained counselor call 1-800-273-TALK (toll free, 24 hour hotline) call the [facility]: (551)068-9232 call 911   Patient verbalizes understanding of instructions and care plan provided today and agrees to view in MyChart. Active MyChart status and patient understanding of how to access instructions and care plan via MyChart confirmed with patient.     The patient has been provided with contact information for the care management team and has been advised to  call with any health related questions or concerns.  Kwamaine Cuppett L. Noelle Penner, RN, BSN, CCM [place]  [facility], [facility] Health RN Care Manager Direct Dial: [phone]  Fax: [phone] Mailing Address: [address]  [place] [address] Website: [email or web address]

## 2023-08-20 NOTE — Telephone Encounter (Signed)
She has not been on it in months- so it should work fine

## 2023-08-20 NOTE — Addendum Note (Signed)
Addended by: Cleda Daub on: 08/20/2023 03:42 PM   Modules accepted: Orders

## 2023-09-20 ENCOUNTER — Ambulatory Visit: Payer: Self-pay | Admitting: *Deleted

## 2023-09-20 DIAGNOSIS — E1169 Type 2 diabetes mellitus with other specified complication: Secondary | ICD-10-CM

## 2023-09-20 DIAGNOSIS — I252 Old myocardial infarction: Secondary | ICD-10-CM

## 2023-09-20 DIAGNOSIS — I10 Essential (primary) hypertension: Secondary | ICD-10-CM

## 2023-09-20 DIAGNOSIS — I2510 Atherosclerotic heart disease of native coronary artery without angina pectoris: Secondary | ICD-10-CM

## 2023-09-20 DIAGNOSIS — F411 Generalized anxiety disorder: Secondary | ICD-10-CM

## 2023-09-20 DIAGNOSIS — J42 Unspecified chronic bronchitis: Secondary | ICD-10-CM

## 2023-09-20 DIAGNOSIS — N1831 Chronic kidney disease, stage 3a: Secondary | ICD-10-CM

## 2023-09-20 NOTE — Patient Outreach (Signed)
 Care Coordination   Follow Up Visit Note   09/20/2023 Name: ADONIS RYTHER MRN: [id number] DOB: [birth year]  KIANDRA SANGUINETTI is a 68 y.o. year old female who sees Bennie Pierini, FNP for primary care. I spoke with  [name] by phone today.  What matters to the patients health and wellness today?  Home not insulated, home cold ,High electric bill ([employer]) She I aware that [employer]SI can help her with electric bill  Need to pay the past due & this month payments   Her home is not insulated, making it cold, hard to keep warm, therefore causing high electric bills.  She needs to pay past due electric bills but does have availability to stay with a friend close by if needed. She is aware of the weatherization program but in order for assistance she will need to get assistance with de cluttering  She has heat sources like kerosene, electric heaters She also will need new flooring in the bathroom and kitchen.  Her home was built in 1964 by her father. She has considered selling her home but would like to find another home first.   Ms Steidle & RN CM discussed her de-clutter readiness.  Confirmed she is open to de clutter-   RN CM noted & reviewed 2024 THN/VBCI social determinants of health services completed.  She reports she would have to get items cleaned in order to take them to a consignment shop and/or [employer]  She reports her friend has offered to assist her with taking photos of her antique items to sell on online sites  She informs RN CM she will follow up on some of the resources discussed and that she is already aware of.    She voiced understanding that MDs that visit homes were available in 2024, may not be in 2025 but RN CM will check for her. Voiced understanding that for any services for home MD visits her insurance coverage will be checked.    She has limited support system (daughter works a lot, 84 year old granddaughter and friend   Goals Addressed              This Visit's Progress    Management of COPD symptoms, home repairs/clutter, thyroidism, diabetes, chronic kidney disease-care coordination services       Patient will be able to voice understanding of her medications plus interactions/contraindications- 08/20/23 meeting goal Patient will seek medical services to manage worsening symptoms 08/20/23 meeting goal Patient respiratory symptoms will be managed/resolved Patient will outreach to find options for specialists (anxiety, home clutter)  Interventions Today    Flowsheet Row Most Recent Value  Chronic Disease   Chronic disease during today's visit Chronic Obstructive Pulmonary Disease (COPD), Diabetes, Chronic Kidney Disease/End Stage Renal Disease (ESRD)  General Interventions   General Interventions Discussed/Reviewed General Interventions Reviewed, Doctor Visits, Community Resources  Doctor Visits Discussed/Reviewed Doctor Visits Reviewed, PCP, Specialist  [Discussed the noted referral to [place] [facility] patient stated she was not aware of the referral]  PCP/Specialist Visits Contact provider for referral to  [Provided her with a list of MDs that do home visits in her AVS/discharge notes]  Contacted provider for referral to --  [referred to VBCI care guide]              SDOH assessments and interventions completed:  No  SDOH Interventions Today    Flowsheet Row Most Recent Value  SDOH Interventions   Housing Interventions Intervention Not Indicated  Utilities Interventions [id number] Referral, Other (Comment)  [patient is aware of variou services as she had assistance from [facility] care guides & SW previously 2024]  Financial Strain Interventions [facility] Provided, [id number] Referral, Other (Comment)  [Patient voice awareness of programs like weatherization program, will check to see if her electric company offers programs like [employer]]        Care Coordination Interventions:  Yes, provided    Follow up plan: Follow up call scheduled for 10/19/23    Encounter Outcome:  Patient Visit Completed   Cala Bradford L. Noelle Penner, RN, BSN, CCM [place]  [facility], [facility] Health RN Care Manager Direct Dial: [phone]  Fax: [phone] Mailing Address: [address]  [place] [address] Website: [email or web address]

## 2023-09-20 NOTE — Patient Instructions (Signed)
 Visit Information  Thank you for taking time to visit with me today. Please don't hesitate to contact me if I can be of assistance to you.     [facility] (([phone]),  [facility] (([phone]),  and [facility] ( call [phone]) are some organizations that provide home visits in the [place] area.   Other organizations that provide MD home visits [facility]: Offers home visits and telemedicine visits [phone] [facility] [place]: Provides community-based services to enrolled individuals who need medical care and support to continue living at home [phone] [place]) [phone])  [facility]  [phone]  and [facility] ([address]    [place], [place] [address]    [phone])     Following are the goals we discussed today:   Goals Addressed             This Visit's Progress    Management of COPD symptoms, home repairs/clutter, thyroidism, diabetes, chronic kidney disease-care coordination services       Patient will be able to voice understanding of her medications plus interactions/contraindications- 08/20/23 meeting goal Patient will seek medical services to manage worsening symptoms 08/20/23 meeting goal Patient respiratory symptoms will be managed/resolved Patient will outreach to find options for specialists (anxiety, home clutter)  Interventions Today    Flowsheet Row Most Recent Value  Chronic Disease   Chronic disease during today's visit Chronic Obstructive Pulmonary Disease (COPD), Diabetes, Chronic Kidney Disease/End Stage Renal Disease (ESRD)  General Interventions   General Interventions Discussed/Reviewed General Interventions Reviewed, Doctor Visits, Community Resources  Doctor Visits Discussed/Reviewed Doctor Visits Reviewed, PCP, Specialist  [Discussed the noted referral to [facility] [facility] patient stated she  was not aware of the referral]  PCP/Specialist Visits Contact provider for referral to  [Provided her with a list of MDs that do home visits in her AVS/discharge notes]  Contacted provider for referral to --  [referred to [facility] care guide]              Our next appointment is by telephone on 10/19/23 at 1015  Please call the care guide team at [phone] if you need to cancel or reschedule your appointment.   If you are experiencing a Mental Health or Behavioral Health Crisis or need someone to talk to, please call the Suicide and Crisis Lifeline: 988 call the [place] National Suicide Prevention Lifeline: 857-273-9566 or TTY: 267-450-3292 TTY 581-097-6004) to talk to a trained counselor call 1-800-273-TALK (toll free, 24 hour hotline) call the [facility]: 2096242583 call 911   Patient verbalizes understanding of instructions and care plan provided today and agrees to view in MyChart. Active MyChart status and patient understanding of how to access instructions and care plan via MyChart confirmed with patient.     The patient has been provided with contact information for the care management team and has been advised to call with any health related questions or concerns.   [name] L. Noelle Penner, RN, BSN, CCM [place]  [facility], [facility] Health RN Care Manager Direct Dial: [phone]  Fax: [phone] Mailing Address: [address]  [place] [address] Website: [email or web address]

## 2023-09-22 ENCOUNTER — Telehealth: Payer: Self-pay | Admitting: *Deleted

## 2023-09-22 NOTE — Progress Notes (Unsigned)
 Complex Care Management Note Care Guide Note  09/22/2023 Name: YAJAYRA FELDT MRN: [id number] DOB: [birth year]   Complex Care Management Outreach Attempts: An unsuccessful telephone outreach was attempted today to offer the patient information about available complex care management services.  Follow Up Plan:  Additional outreach attempts will be made to offer the patient complex care management information and services.   Encounter Outcome:  No Answer  [name]  [facility] Health  [facility], [facility] Guide  Direct Dial: [phone]  Fax [phone]

## 2023-09-23 NOTE — Progress Notes (Signed)
 Complex Care Management Care Guide Note  09/23/2023 Name: [name] MRN: [id number] DOB: [birth year]  [name] is a 68 y.o. year old female who is a primary care patient of Bennie Pierini, FNP and is actively engaged with the care management team. I reached out to [name] by phone today to assist with scheduling  with the BSW.  Follow up plan: Telephone appointment with complex care management team member scheduled for:  3/10  [name]  [facility] Health  [facility], [facility] Guide  Direct Dial: [phone]  Fax [phone]

## 2023-09-30 ENCOUNTER — Telehealth: Payer: Self-pay | Admitting: *Deleted

## 2023-09-30 NOTE — Progress Notes (Signed)
 Complex Care Management Note Care Guide Note  09/30/2023 Name: [name] MRN: [id number] DOB: November 02, 1955   Complex Care Management Outreach Attempts: An unsuccessful telephone outreach was attempted today to offer the patient information about available complex care management services.  Follow Up Plan:  Additional outreach attempts will be made to offer the patient complex care management information and services.   Encounter Outcome:  No Answer  [facility] Care Guide  Direct Dial:[phone] Fax:[phone] Website: [email or web address]

## 2023-10-04 ENCOUNTER — Ambulatory Visit: Payer: Self-pay

## 2023-10-04 NOTE — Patient Outreach (Signed)
 Care Coordination   10/04/2023 Name: [name] MRN: [id number] DOB: Feb 13, 1956   Care Coordination Outreach Attempts:  An unsuccessful outreach was attempted for an appointment today.  Follow Up Plan:  Additional outreach attempts will be made to offer the patient complex care management information and services.   Encounter Outcome:  No Answer   Care Coordination Interventions:  No, not indicated    Lysle Morales, BSW [place]  [facility], [facility] Social Worker Direct Dial: [phone]  Fax: [phone] Website: [email or web address]

## 2023-10-05 ENCOUNTER — Telehealth: Payer: Self-pay | Admitting: *Deleted

## 2023-10-05 NOTE — Progress Notes (Signed)
 Complex Care Management Note Care Guide Note  10/05/2023 Name: SHAUNTEE KARP MRN: [id number] DOB: 10-Sep-1955   Complex Care Management Outreach Attempts: An unsuccessful telephone outreach was attempted today to offer the patient information about available complex care management services.  Follow Up Plan:  Additional outreach attempts will be made to offer the patient complex care management information and services.   Encounter Outcome:  No Answer  [facility] HealthPopulation Health Care Guide  Direct [phone] Fax:[phone] Website: [email or web address]

## 2023-10-14 ENCOUNTER — Ambulatory Visit: Payer: Self-pay

## 2023-10-14 NOTE — Patient Outreach (Signed)
 Care Coordination   Initial Visit Note   10/14/2023 Name: [name] MRN: [id number] DOB: [birth year]  [name] is a 68 y.o. year old female who sees Bennie Pierini, FNP for primary care. I spoke with  [name] by phone today.  What matters to the patients health and wellness today?  Patients home needs repairs but has to declutter home.  Patient can not afford to declutter or the cost of repairs. Daughter doesn't have room to allow patient to move him. Patient feels the home is not in poor condition and rated the home a 7 out of 10 (10 being perfect).  The home has been repaired in the past by an organization and patient has to pay the difference if she moves within a 10 year window. Patient will make a decision in the next few months on selling the home.  Patient has OTC $60 through [facility].     Goals Addressed             This Visit's Progress    Care Coordination Activities       Interventions Today    Flowsheet Row Most Recent Value  Chronic Disease   Chronic disease during today's visit Diabetes, Chronic Obstructive Pulmonary Disease (COPD), Hypertension (HTN), Chronic Kidney Disease/End Stage Renal Disease (ESRD)  General Interventions   General Interventions Discussed/Reviewed General Interventions Discussed, General Interventions Reviewed, [employer] is scheduled for disconnect 3/26 $260.Pt receives $1500 but had car repairs.SW referred to [employer].Pt can't afford to declutter & no family/church/friends to help.May sell home & get an apartment near daughter.Referral to food banks.]  Education Interventions   Education Provided Provided Education  [SW educated on information to take to DSS/[employer] to apply for assistance.  SW provided contact number and address.]              SDOH assessments and interventions completed:  Yes  SDOH Interventions Today    Flowsheet Row Most Recent Value  SDOH Interventions    Food Insecurity Interventions Intervention Not Indicated  [Foodstamps $23]  Housing Interventions Other (Comment)  [Home needs repairs]  Transportation Interventions Intervention Not Indicated  [Has a car]  Utilities Interventions Other (Comment)  [Contact community resources DSS/[employer]]        Care Coordination Interventions:  Yes, provided   Follow up plan: No further intervention required.   Encounter Outcome:  Patient Visit Completed

## 2023-10-14 NOTE — Patient Instructions (Signed)
 Visit Information  Thank you for taking time to visit with me today. Please don't hesitate to contact me if I can be of assistance to you.   Following are the goals we discussed today:  Patient will contact food banks, DSS, Holiday representative for assistance. Patient will decide if she will sell her property and move into an apartment.    If you are experiencing a Mental Health or Behavioral Health Crisis or need someone to talk to, please call 911  Patient verbalizes understanding of instructions and care plan provided today and agrees to view in MyChart. Active MyChart status and patient understanding of how to access instructions and care plan via MyChart confirmed with patient.     No further follow up required: Patient does not request a follow up visit.  Lysle Morales, BSW [place]  [facility], [facility] Social Worker Direct Dial: [phone]  Fax: [phone] Website: [email or web address]

## 2023-10-18 ENCOUNTER — Other Ambulatory Visit: Payer: Self-pay | Admitting: Nurse Practitioner

## 2023-10-18 NOTE — Telephone Encounter (Signed)
 Copied from CRM [id number]. Topic: Clinical - Medication Refill >> Oct 18, 2023  9:41 AM Elle L wrote: Most Recent Primary Care Visit:   Medication: metoprolol tartrate (LOPRESSOR) 25 MG tablet AND gabapentin (NEURONTIN) 100 MG capsule   It would not allow me to pend the Metoprolol.   Has the patient contacted their pharmacy? Yes, the patient needs all future medications sent to the [facility].   Is this the correct pharmacy for this prescription? Yes  This is the patient's preferred pharmacy:  [facility] [address] [place] - [address] [place] [address] Phone: [phone] Fax: [phone]  Has the prescription been filled recently? No  Is the patient out of the medication? Yes  Has the patient been seen for an appointment in the last year OR does the patient have an upcoming appointment? Yes  Can we respond through MyChart? Yes  Agent: Please be advised that Rx refills may take up to 3 business days. We ask that you follow-up with your pharmacy.

## 2023-10-19 ENCOUNTER — Ambulatory Visit: Payer: Self-pay | Admitting: *Deleted

## 2023-10-19 MED ORDER — GABAPENTIN 100 MG PO CAPS: 100.0000 mg | ORAL_CAPSULE | Freq: Every day | ORAL | 1 refills | Status: DC

## 2023-10-19 NOTE — Patient Outreach (Signed)
 Care Coordination   10/19/2023 Name: [name] MRN: [id number] DOB: 09/06/1955   Care Coordination Outreach Attempts:  An unsuccessful outreach was attempted for an appointment today.  Follow Up Plan:  No further outreach attempts will be made at this time. We have been unable to contact the patient to offer or enroll patient in complex care management services.  Encounter Outcome:  No Answer   Care Coordination Interventions:  Yes, provided    Dontee Jaso L. Noelle Penner, RN, BSN, CCM [place]  [facility], [place] Health RN Care Manager Direct Dial: [phone]  Fax: [phone]

## 2023-10-20 ENCOUNTER — Telehealth: Payer: Self-pay | Admitting: *Deleted

## 2023-10-20 NOTE — Progress Notes (Signed)
 Complex Care Management Care Guide Note  10/20/2023 Name: JAMIRIA LANGILL MRN: [id number] DOB: Apr 27, 1956  CARIE KAPUSCINSKI is a 68 y.o. year old female who is a primary care patient of Bennie Pierini, FNP and is actively engaged with the care management team. I reached out to [name] by phone today to assist with re-scheduling  with the RN Case Manager.  Follow up plan: Unsuccessful telephone outreach attempt made.   [name]  [facility] Health  Value-Based Care Institute, [facility] Guide  Direct Dial: [phone]  Fax [phone]

## 2023-10-21 ENCOUNTER — Telehealth: Payer: Self-pay | Admitting: Nurse Practitioner

## 2023-10-21 ENCOUNTER — Other Ambulatory Visit: Payer: Self-pay

## 2023-10-21 DIAGNOSIS — I10 Essential (primary) hypertension: Secondary | ICD-10-CM

## 2023-10-21 MED ORDER — GABAPENTIN 100 MG PO CAPS: 100.0000 mg | ORAL_CAPSULE | Freq: Every day | ORAL | 1 refills | Status: DC

## 2023-10-21 MED ORDER — METOPROLOL TARTRATE 25 MG PO TABS: 25.0000 mg | ORAL_TABLET | Freq: Two times a day (BID) | ORAL | 1 refills | Status: DC

## 2023-10-21 NOTE — Addendum Note (Signed)
 Addended by: Cleda Daub on: 10/21/2023 02:35 PM   Modules accepted: Orders

## 2023-10-21 NOTE — Telephone Encounter (Signed)
 Copied from CRM [id number]. Topic: Clinical - Medication Refill >> Oct 18, 2023  9:41 AM Elle L wrote: Most Recent Primary Care Visit:   Medication: metoprolol tartrate (LOPRESSOR) 25 MG tablet AND gabapentin (NEURONTIN) 100 MG capsule   Has the patient contacted their pharmacy? Yes, the patient needs all future medications sent to the [facility].   Is this the correct pharmacy for this prescription? Yes  This is the patient's preferred pharmacy:  [facility] [address], [place] - [address] [place] [address] Phone: [phone] Fax: [phone]  Has the prescription been filled recently? No  Is the patient out of the medication? Yes  Has the patient been seen for an appointment in the last year OR does the patient have an upcoming appointment? Yes  Can we respond through MyChart? Yes  Agent: Please be advised that Rx refills may take up to 3 business days. We ask that you follow-up with your pharmacy. >> Oct 21, 2023 10:53 AM Luther Parody E wrote: metoprolol tartrate (LOPRESSOR) 25 MG tablet  patient called in asking about the status of the refill, patient called in requesting the refill on 10/18/23.  pharmacy sent text  10/21/23  saying they have not received a refill order from the provider. Patient will take last pill tonight. Patient phone [phone]    The patient uses this pharmacy.  [facility] [address] [place] - [address]  [address] [place] [address]  Phone: [phone] Fax: [phone]

## 2023-10-21 NOTE — Telephone Encounter (Signed)
 Both rxs resent to [facility] per patients request

## 2023-10-29 NOTE — Progress Notes (Signed)
 Complex Care Management Care Guide Note  10/29/2023 Name: [name] MRN: [id number] DOB: 02-16-1956  [name] is a 68 y.o. year old female who is a primary care patient of Bennie Pierini, FNP and is actively engaged with the care management team. I reached out to [name] by phone today to assist with re-scheduling  with the RN Case Manager.  Follow up plan: Unsuccessful telephone outreach attempt made.  No further outreach attempts will be made at this time. We have been unable to contact the patient to reschedule for complex care management services.  [name]  [facility]  [facility], [facility] Guide  Direct Dial: [phone]  Fax [phone]

## 2023-11-05 ENCOUNTER — Ambulatory Visit: Payer: Medicare Other | Admitting: Nurse Practitioner

## 2023-11-05 ENCOUNTER — Encounter: Payer: Self-pay | Admitting: Nurse Practitioner

## 2023-11-05 VITALS — BP 141/64 | HR 65 | Temp 97.6°F | Ht 66.0 in | Wt 221.0 lb

## 2023-11-05 DIAGNOSIS — F411 Generalized anxiety disorder: Secondary | ICD-10-CM

## 2023-11-05 DIAGNOSIS — E785 Hyperlipidemia, unspecified: Secondary | ICD-10-CM | POA: Diagnosis not present

## 2023-11-05 DIAGNOSIS — E1169 Type 2 diabetes mellitus with other specified complication: Secondary | ICD-10-CM | POA: Diagnosis not present

## 2023-11-05 DIAGNOSIS — I10 Essential (primary) hypertension: Secondary | ICD-10-CM

## 2023-11-05 DIAGNOSIS — F5101 Primary insomnia: Secondary | ICD-10-CM | POA: Diagnosis not present

## 2023-11-05 DIAGNOSIS — I6523 Occlusion and stenosis of bilateral carotid arteries: Secondary | ICD-10-CM | POA: Diagnosis not present

## 2023-11-05 DIAGNOSIS — I2510 Atherosclerotic heart disease of native coronary artery without angina pectoris: Secondary | ICD-10-CM | POA: Diagnosis not present

## 2023-11-05 DIAGNOSIS — E1122 Type 2 diabetes mellitus with diabetic chronic kidney disease: Secondary | ICD-10-CM | POA: Diagnosis not present

## 2023-11-05 DIAGNOSIS — E559 Vitamin D deficiency, unspecified: Secondary | ICD-10-CM

## 2023-11-05 DIAGNOSIS — N182 Chronic kidney disease, stage 2 (mild): Secondary | ICD-10-CM

## 2023-11-05 DIAGNOSIS — G2581 Restless legs syndrome: Secondary | ICD-10-CM

## 2023-11-05 DIAGNOSIS — J449 Chronic obstructive pulmonary disease, unspecified: Secondary | ICD-10-CM | POA: Diagnosis not present

## 2023-11-05 DIAGNOSIS — F3342 Major depressive disorder, recurrent, in full remission: Secondary | ICD-10-CM

## 2023-11-05 LAB — BAYER DCA HB A1C WAIVED: HB A1C (BAYER DCA - WAIVED): 7.8 % — ABNORMAL HIGH (ref 4.8–5.6)

## 2023-11-05 MED ORDER — CLONIDINE HCL 0.3 MG PO TABS: 0.3000 mg | ORAL_TABLET | Freq: Two times a day (BID) | ORAL | 1 refills | Status: AC

## 2023-11-05 MED ORDER — METFORMIN HCL 500 MG PO TABS: 500.0000 mg | ORAL_TABLET | Freq: Two times a day (BID) | ORAL | 1 refills | Status: AC

## 2023-11-05 MED ORDER — METOPROLOL TARTRATE 25 MG PO TABS
25.0000 mg | ORAL_TABLET | Freq: Two times a day (BID) | ORAL | 1 refills | Status: AC
Start: 2023-11-05 — End: ?

## 2023-11-05 MED ORDER — ATORVASTATIN CALCIUM 80 MG PO TABS: 80.0000 mg | ORAL_TABLET | Freq: Every day | ORAL | 1 refills | Status: AC

## 2023-11-05 MED ORDER — GABAPENTIN 100 MG PO CAPS: 100.0000 mg | ORAL_CAPSULE | Freq: Every day | ORAL | 1 refills | Status: AC

## 2023-11-05 MED ORDER — HYDROCHLOROTHIAZIDE 25 MG PO TABS: 25.0000 mg | ORAL_TABLET | Freq: Every day | ORAL | 3 refills | Status: AC

## 2023-11-05 MED ORDER — ESCITALOPRAM OXALATE 20 MG PO TABS: 20.0000 mg | ORAL_TABLET | Freq: Every day | ORAL | 1 refills | Status: DC

## 2023-11-05 MED ORDER — LISINOPRIL 40 MG PO TABS: 40.0000 mg | ORAL_TABLET | Freq: Every day | ORAL | 3 refills | Status: AC

## 2023-11-05 MED ORDER — PRAMIPEXOLE DIHYDROCHLORIDE 1 MG PO TABS: 1.0000 mg | ORAL_TABLET | Freq: Two times a day (BID) | ORAL | 1 refills | Status: AC

## 2023-11-05 MED ORDER — BREZTRI AEROSPHERE 160-9-4.8 MCG/ACT IN AERO: 2.0000 | INHALATION_SPRAY | Freq: Two times a day (BID) | RESPIRATORY_TRACT | 3 refills | Status: DC

## 2023-11-05 NOTE — Patient Instructions (Signed)

## 2023-11-05 NOTE — Progress Notes (Signed)
 Subjective:    Patient ID: [name], female    DOB: 1956-01-21, 68 y.o.   MRN: [id number]   Chief Complaint: medical management of chronic issues     HPI:  [name] is a 68 y.o. who identifies as a female who was assigned female at birth.   Social history: Lives with: by herself Work history: disability   Comes in today for follow up of the following chronic medical issues:  1. Primary hypertension No c/o chest pain, sob or headache. We added lisinopril to meda at last visit. Does check blood pressure at home. Blood pressure runs 150 systolic BP Readings from Last 3 Encounters:  08/17/23 (!) 190/79  08/03/23 (!) 173/92  07/19/23 (!) 158/70     2. Type 2 diabetes mellitus with stage 2 chronic kidney disease, without long-term current use of insulin (HCC) She does not check her blood sugar at home very often. Does not watch diet  Lab Results  Component Value Date   HGBA1C 7.5 (H) 08/17/2023     3. Hyperlipidemia associated with type 2 diabetes mellitus (HCC) Does try to watch diet but does no exercises at all. Lab Results  Component Value Date   CHOL 150 08/17/2023   HDL 46 08/17/2023   LDLCALC 78 08/17/2023   LDLDIRECT 100.0 05/16/2007   TRIG 148 08/17/2023   CHOLHDL 3.3 08/17/2023     4. CAD S/P percutaneous coronary angioplasty Has not seen cardiology in awhile  5. Bilateral carotid artery stenosis Last carotid doppler study was done in 2020. Showed minor stenosis.  6. Chronic obstructive pulmonary disease, unspecified COPD type (HCC) Quit smoking 2 years ago. Only inhaler is albuterol which she uses occasionally. Had recent low dose CT scan which was negative for malignancy.  7. Recurrent major depressive disorder, in full remission (HCC) Is on lexapro and is doing about the same. She never wants to change her meds    11/05/2023    2:23 PM 08/17/2023    2:35 PM 07/26/2023    2:44 PM  Depression screen PHQ 2/9  Decreased Interest 2 2 1    Down, Depressed, Hopeless 2 1 1   PHQ - 2 Score 4 3 2   Altered sleeping 3 3 3   Tired, decreased energy 3 3 3   Change in appetite 2 2 2   Feeling bad or failure about yourself  2 1 2   Trouble concentrating 2 2 1   Moving slowly or fidgety/restless 2 1 1   Suicidal thoughts 1 0 0  PHQ-9 Score 19 15 14   Difficult doing work/chores Extremely dIfficult Very difficult       8. Generalized anxiety disorder Is on no antianxiety meds other than lexapro. She failed a drug test so we stopped her xanax. She says her anxiety is from not sleeping. She has still been smoking marijuana.     11/05/2023    2:24 PM 08/17/2023    2:36 PM 07/19/2023    8:53 AM 05/17/2023    2:03 PM  GAD 7 : Generalized Anxiety Score  Nervous, Anxious, on Edge 3 2 2 3   Control/stop worrying 2 1 1 2   Worry too much - different things 2 2 2 2   Trouble relaxing 3 3 2 3   Restless 2 1 1 2   Easily annoyed or irritable 3 2 3 3   Afraid - awful might happen 3 2 1 2   Total GAD 7 Score 18 13 12 17   Anxiety Difficulty Extremely difficult Very difficult Very difficult Very difficult  9. Restless leg syndrome Is on mirapex and that is working well for her.  10. Primary insomnia We tried [place] and that made her "bonkers". Only sleeping about 2-4 hours at a time. She says trazadone did not work either.  11. Vitamin D deficiency Is on daily vitamin d supplement  12. Morbid obesity (HCC) No recent weight changes  Wt Readings from Last 3 Encounters:  11/05/23 221 lb (100.2 kg)  08/17/23 220 lb (99.8 kg)  08/03/23 218 lb (98.9 kg)   BMI Readings from Last 3 Encounters:  11/05/23 35.67 kg/m  08/17/23 35.51 kg/m  08/03/23 35.19 kg/m         New complaints:  Allergies  Allergen Reactions   Food Other (See Comments)    Nutmeg= difficulty breathing   Nutmeg Oil (Myristica Oil) Hives   Other Other (See Comments)    Nutmeg= difficulty breathing   Amlodipine Swelling   Outpatient Encounter  Medications as of 11/05/2023  Medication Sig   albuterol (VENTOLIN HFA) 108 (90 Base) MCG/ACT inhaler Inhale 2 puffs into the lungs every 6 (six) hours as needed for wheezing or shortness of breath.   Alcohol Swabs (B-D SINGLE USE SWABS REGULAR) PADS check blood sugars twice daily Dx E11.9   ALPRAZolam (XANAX) 0.25 MG tablet Take 1 tablet (0.25 mg total) by mouth at bedtime as needed for anxiety.   aspirin EC 81 MG tablet Take 1 tablet (81 mg total) by mouth daily. Swallow whole.   atorvastatin (LIPITOR) 80 MG tablet Take 1 tablet (80 mg total) by mouth at bedtime.   Biotin 2.5 MG CAPS Take by mouth.   BREZTRI AEROSPHERE 160-9-4.8 MCG/ACT AERO INHALE 2 PUFFS INTO THE LUNGS IN THE MORNING AND AT BEDTIME.   Cholecalciferol (VITAMIN D3) 5000 units TABS Take 10,000 Units by mouth every evening.   cloNIDine (CATAPRES) 0.3 MG tablet Take 1 tablet (0.3 mg total) by mouth 2 (two) times daily.   escitalopram (LEXAPRO) 20 MG tablet TAKE 1 TABLET (20 MG TOTAL) BY MOUTH DAILY.   fish oil-omega-3 fatty acids 1000 MG capsule Take 1,000 mg by mouth at bedtime.   gabapentin (NEURONTIN) 100 MG capsule Take 1 capsule (100 mg total) by mouth at bedtime.   hydrochlorothiazide (HYDRODIURIL) 25 MG tablet Take 1 tablet (25 mg total) by mouth daily.   lisinopril (ZESTRIL) 40 MG tablet Take 1 tablet (40 mg total) by mouth daily.   metFORMIN (GLUCOPHAGE) 500 MG tablet Take 1 tablet (500 mg total) by mouth 2 (two) times daily with a meal.   metoprolol tartrate (LOPRESSOR) 25 MG tablet Take by mouth.   metoprolol tartrate (LOPRESSOR) 25 MG tablet Take 1 tablet (25 mg total) by mouth 2 (two) times daily.   pramipexole (MIRAPEX) 1 MG tablet Take 1 tablet (1 mg total) by mouth in the morning and at bedtime.   vitamin B-12 (CYANOCOBALAMIN) 1000 MCG tablet Take 1 tablet (1,000 mcg total) by mouth daily.   No facility-administered encounter medications on file as of 11/05/2023.    Past Surgical History:  Procedure Laterality  Date   AXILLARY LYMPH NODE DISSECTION  07/31/2011   Procedure: AXILLARY LYMPH NODE DISSECTION;  Surgeon: [name], MD;  Location: [facility];  Service: General;  Laterality: Left;  left axillary sentinal node biopsy   BREAST LUMPECTOMY Left 06/30/2011   BREAST SURGERY     CARDIAC CATHETERIZATION  11/02/2006; 06/03/2011   CATARACT EXTRACTION Bilateral    CESAREAN SECTION  1987   CORONARY STENT PLACEMENT  11/02/2006   ELBOW SURGERY     right   LEFT HEART CATH AND CORONARY ANGIOGRAPHY N/A 10/07/2020   Procedure: LEFT HEART CATH AND CORONARY ANGIOGRAPHY;  Surgeon: [name], MD;  Location: [facility] INVASIVE CV LAB;  Service: Cardiovascular;  Laterality: N/A;    Family History  Problem Relation Age of Onset   Heart disease Mother    Heart disease Father    Heart attack Sister    Bipolar disorder Sister    Cancer Maternal Aunt        pt unaware of what kind   Cancer Maternal Grandmother        ovarian   Thyroid disease Daughter    Anxiety disorder Daughter    Depression Daughter    Colon cancer Neg Hx    Colon polyps Neg Hx    Esophageal cancer Neg Hx    Rectal cancer Neg Hx    Stomach cancer Neg Hx       Controlled substance contract: n/a     Review of Systems  Constitutional:  Negative for diaphoresis.  Eyes:  Negative for pain.  Respiratory:  Negative for shortness of breath.   Cardiovascular:  Negative for chest pain, palpitations and leg swelling.  Gastrointestinal:  Negative for abdominal pain.  Endocrine: Negative for polydipsia.  Skin:  Negative for rash.  Neurological:  Negative for dizziness, weakness and headaches.  Hematological:  Does not bruise/bleed easily.  All other systems reviewed and are negative.      Objective:   Physical Exam Vitals and nursing note reviewed.  Constitutional:      General: She is not in acute distress.    Appearance: Normal appearance. She is well-developed.  HENT:     Head: Normocephalic.     Right  Ear: Tympanic membrane normal.     Left Ear: Tympanic membrane normal.     Nose: Nose normal.     Mouth/Throat:     Mouth: Mucous membranes are moist.  Eyes:     Pupils: Pupils are equal, round, and reactive to light.  Neck:     Vascular: No carotid bruit or JVD.  Cardiovascular:     Rate and Rhythm: Normal rate and regular rhythm.     Heart sounds: Normal heart sounds.  Pulmonary:     Effort: Pulmonary effort is normal. No respiratory distress.     Breath sounds: Normal breath sounds. No wheezing or rales.  Chest:     Chest wall: No tenderness.  Abdominal:     General: Bowel sounds are normal. There is no distension or abdominal bruit.     Palpations: Abdomen is soft. There is no hepatomegaly, splenomegaly, mass or pulsatile mass.     Tenderness: There is no abdominal tenderness.  Musculoskeletal:        General: Normal range of motion.     Cervical back: Normal range of motion and neck supple.  Lymphadenopathy:     Cervical: No cervical adenopathy.  Skin:    General: Skin is warm and dry.  Neurological:     Mental Status: She is alert and oriented to person, place, and time.     Deep Tendon Reflexes: Reflexes are normal and symmetric.  Psychiatric:        Behavior: Behavior normal.        Thought Content: Thought content normal.        Judgment: Judgment normal.    BP (!) 141/64   Pulse 65   Temp 97.6 F (36.4 C) (  Temporal)   Ht 5\' 6"  (1.676 m)   Wt 221 lb (100.2 kg)   LMP 10/17/2005   SpO2 99%   BMI 35.67 kg/m    LMP 10/17/2005     Hgba1c 7.8%    Assessment & Plan:  [name] comes in today with chief complaint of medical management of chronic issues    Diagnosis and orders addressed:  1. Primary hypertension Low sodium diet Added hydrochlorothiazide to meds  Continue to check blood pressure at home - CBC with Differential/Platelet - CMP14+EGFR - lisinopril (ZESTRIL) 40 MG tablet; Take 1 tablet (40 mg total) by mouth daily.  Dispense: 90  tablet; Refill: 3 - hydrochlorothiazide 25mg  1 po daily #90 1 refill  2. Type 2 diabetes mellitus with stage 2 chronic kidney disease, without long-term current use of insulin (HCC) Continue to watch carbs I diet - Bayer DCA Hb A1c Waived - Microalbumin / creatinine urine ratio  3. Hyperlipidemia associated with type 2 diabetes mellitus (HCC) Low fat diet - Lipid panel  4. CAD S/P percutaneous coronary angioplasty  5. Bilateral carotid artery stenosis Ordered doppler study - US Carotid Duplex Bilateral; Future  6. Chronic obstructive pulmonary disease, unspecified COPD type (HCC) Do not start smoking again  7. Recurrent major depressive disorder, in full remission (HCC) Thinks will do better if she can sleep Continue lexapro  8. Generalized anxiety disorder Will have to have a negative drug screen in order to get xanax back  9. Restless leg syndrome Keep legs warm at night  10. Primary insomnia - doxepin (SINEQUAN) 10 MG/ML solution; Take 1 mL (10 mg total) by mouth at bedtime.  Dispense: 30 mL; Refill: 5  11. Vitamin D deficiency Continue daily vitamin d supplement  12. Morbid obesity (HCC) Discussed diet and exercise for person with BMI >25 Will recheck weight in 3-6 months   13. Continuous leakage of urine Added detrol 2 mg daily- will take several s\weeks to work    [employer] pending Health Maintenance reviewed Diet and exercise encouraged  Follow up plan: 3 months   [name] [name], FNP

## 2023-11-06 LAB — CBC WITH DIFFERENTIAL/PLATELET
Basophils Absolute: 0.1 10*3/uL (ref 0.0–0.2)
Basos: 0 %
EOS (ABSOLUTE): 0.2 10*3/uL (ref 0.0–0.4)
Eos: 2 %
Hematocrit: 40 % (ref 34.0–46.6)
Hemoglobin: 12.6 g/dL (ref 11.1–15.9)
Immature Grans (Abs): 0.1 10*3/uL (ref 0.0–0.1)
Immature Granulocytes: 1 %
Lymphocytes Absolute: 1 10*3/uL (ref 0.7–3.1)
Lymphs: 8 %
MCH: 27.5 pg (ref 26.6–33.0)
MCHC: 31.5 g/dL (ref 31.5–35.7)
MCV: 87 fL (ref 79–97)
Monocytes Absolute: 0.6 10*3/uL (ref 0.1–0.9)
Monocytes: 5 %
Neutrophils Absolute: 10.9 10*3/uL — ABNORMAL HIGH (ref 1.4–7.0)
Neutrophils: 84 %
Platelets: 222 10*3/uL (ref 150–450)
RBC: 4.59 x10E6/uL (ref 3.77–5.28)
RDW: 14.5 % (ref 11.7–15.4)
WBC: 12.8 10*3/uL — ABNORMAL HIGH (ref 3.4–10.8)

## 2023-11-06 LAB — CMP14+EGFR
ALT: 12 IU/L (ref 0–32)
AST: 14 IU/L (ref 0–40)
Albumin: 4 g/dL (ref 3.9–4.9)
Alkaline Phosphatase: 84 IU/L (ref 44–121)
BUN/Creatinine Ratio: 18 (ref 12–28)
BUN: 21 mg/dL (ref 8–27)
Bilirubin Total: 0.3 mg/dL (ref 0.0–1.2)
CO2: 25 mmol/L (ref 20–29)
Calcium: 9.8 mg/dL (ref 8.7–10.3)
Chloride: 98 mmol/L (ref 96–106)
Creatinine, Ser: 1.18 mg/dL — ABNORMAL HIGH (ref 0.57–1.00)
Globulin, Total: 2.2 g/dL (ref 1.5–4.5)
Glucose: 206 mg/dL — ABNORMAL HIGH (ref 70–99)
Potassium: 4.2 mmol/L (ref 3.5–5.2)
Sodium: 140 mmol/L (ref 134–144)
Total Protein: 6.2 g/dL (ref 6.0–8.5)
eGFR: 51 mL/min/{1.73_m2} — ABNORMAL LOW (ref >59)

## 2023-11-06 LAB — LIPID PANEL
Chol/HDL Ratio: 3.8 ratio (ref 0.0–4.4)
Cholesterol, Total: 128 mg/dL (ref 100–199)
HDL: 34 mg/dL — ABNORMAL LOW (ref >39)
LDL Chol Calc (NIH): 56 mg/dL (ref 0–99)
Triglycerides: 236 mg/dL — ABNORMAL HIGH (ref 0–149)
VLDL Cholesterol Cal: 38 mg/dL (ref 5–40)

## 2023-11-06 LAB — VITAMIN B12: Vitamin B-12: >2000 pg/mL — ABNORMAL HIGH (ref 232–1245)

## 2023-12-24 DIAGNOSIS — L853 Xerosis cutis: Secondary | ICD-10-CM | POA: Diagnosis not present

## 2023-12-24 DIAGNOSIS — E1142 Type 2 diabetes mellitus with diabetic polyneuropathy: Secondary | ICD-10-CM | POA: Diagnosis not present

## 2023-12-24 DIAGNOSIS — I2510 Atherosclerotic heart disease of native coronary artery without angina pectoris: Secondary | ICD-10-CM | POA: Diagnosis not present

## 2023-12-24 DIAGNOSIS — E1122 Type 2 diabetes mellitus with diabetic chronic kidney disease: Secondary | ICD-10-CM | POA: Diagnosis not present

## 2023-12-24 DIAGNOSIS — J438 Other emphysema: Secondary | ICD-10-CM | POA: Diagnosis not present

## 2023-12-24 DIAGNOSIS — I6523 Occlusion and stenosis of bilateral carotid arteries: Secondary | ICD-10-CM | POA: Diagnosis not present

## 2023-12-24 DIAGNOSIS — E1159 Type 2 diabetes mellitus with other circulatory complications: Secondary | ICD-10-CM | POA: Diagnosis not present

## 2023-12-24 DIAGNOSIS — E1169 Type 2 diabetes mellitus with other specified complication: Secondary | ICD-10-CM | POA: Diagnosis not present

## 2023-12-24 DIAGNOSIS — G2581 Restless legs syndrome: Secondary | ICD-10-CM | POA: Diagnosis not present

## 2023-12-31 ENCOUNTER — Telehealth: Payer: Self-pay

## 2023-12-31 ENCOUNTER — Telehealth: Payer: Self-pay | Admitting: Family Medicine

## 2023-12-31 NOTE — Telephone Encounter (Signed)
 Returned patient call, patient stated that she would schedule diabetic eye exam with her normal eye doctor.

## 2023-12-31 NOTE — Telephone Encounter (Signed)
 Called patient to schedule DM eye exam

## 2023-12-31 NOTE — Telephone Encounter (Signed)
 Copied from CRM [id number]. Topic: General - Other >> Dec 31, 2023 11:58 AM Felizardo Hotter wrote: Reason for CRM: Pt returning Lisbeth Rides, CMA call to schedule DM eye exam, warm transferred to clinic with no answer from clinic. Please call pt at [phone].

## 2024-01-18 DIAGNOSIS — H5213 Myopia, bilateral: Secondary | ICD-10-CM | POA: Diagnosis not present

## 2024-01-18 DIAGNOSIS — E119 Type 2 diabetes mellitus without complications: Secondary | ICD-10-CM | POA: Diagnosis not present

## 2024-02-07 ENCOUNTER — Ambulatory Visit: Admitting: Nurse Practitioner

## 2024-02-19 NOTE — Progress Notes (Signed)
 Kidney function has slightly decreased each time we have checked labs over the last two weeks. I want to recheck labs one more time this week with non-fasting lab only. If kidney function hasn't increased, I will recommend establishing care with nephrology to ensure levels do not continue to decrease. Avoid NSAIDs and hydrate with plenty of water in the meantime.

## 2024-02-20 NOTE — Progress Notes (Unsigned)
 Cardiology Office Note    Date:  02/21/2024  ID:  [name], DOB 02-Jan-1956, MRN [id number] PCP:  Lynwood Laneta ORN, PA-C  Cardiologist:  Newman JINNY Lawrence, MD  Electrophysiologist:  None   Chief Complaint: Follow up for HFpEF   History of Present Illness: .   [name] is a 68 y.o. female with visit-pertinent history of anxiety, breast cancer, CAD with NSTEMI in 2008, hypertension, hyperlipidemia, type 2 diabetes mellitus, bilateral carotid stenosis, COPD and depression.  Patient underwent coronary stenting in 2000 with a BMS in the right coronary artery.  Patient had preoperative stress test in 2012 for lumpectomy.  This demonstrated inferolateral ischemia and she was referred for cardiac catheterization which showed total occlusion of RCA with left-to-right collaterals.  There was also notation of chronic occlusion of the left circumflex which was present on previous study in 2008.  She had mild to moderate disease in the LAD.  At her visit in 2012 she had mild anginal symptoms and was managed medically.  Patient establish care with Dr. Hobart at visit in 09/2020.  She felt more fatigue with decreased exercise capacity, worsening dyspnea on exertion and mild chest discomfort.  She underwent LHC on 10/07/2020 which showed stable coronary anatomy with CTO of RCA recommended for continued medical management.  At visit in 11/2020 she was continued to feel tired and was worried about suffering from depression.  She had continued shortness of breath.  TTE on 02/05/2021 indicated LVEF 60 to 65%, G1 DD, normal PASP, aortic sclerosis, RAP 3.  Patient was last seen in clinic on 06/16/2021, she reported that her blood pressure had been spiking at home as high as 190.  She was started on clonidine  0.1 mg twice daily by her PCP.  She had noted some improvement but continued of spikes during the day.  Prior to appointment Dr. Hobart had increased her Lasix  to 40 mg twice daily and told her to  avoid sodium.  Following that her blood pressures had improved to 120-130 and her abdominal bloating resolved.  Patient continued to have significant dyspnea on exertion however it was noted she was a current smoker, her O2 saturation while at rest was 89% while in office and had not followed regularly with pulmonology.  Patient was referred to pulmonology for COPD patient was started on spironolactone  25 mg daily, clonidine  was discontinued given risk of rebound hypertension and was referred to hypertension clinic.  Patient was then lost to follow-up.  On chart review on 01/26/2024 patient presented to [facility] with complaints of shortness of breath and foot swelling.  She reported bilateral leg swelling for 2 weeks, noted to have 1+ peripheral pitting edema bilaterally, also felt to have COPD exacerbation.  EKG showed sinus rhythm at 65 bpm, no ischemic changes.  Per notes patient was treated with steroids.  Chest x-ray showed minimal capsular atelectasis with otherwise clear lungs, hyperinflation, cardiomegaly, aortic arch atherosclerotic calcifications.  Patient was discharged with supplemental oxygen  to use on exertion and prescribed prednisone  20 mg daily for 3 days as well as furosemide  for 3 days.  Today she presents for follow-up.  She reports that she has been doing well overall.  She denies any chest pain, she reports stable dyspnea on exertion.  Patient notes that she has a history of COPD, has not recently followed with a pulmonologist.  Patient reports that she recently established with a [facility] who has been adjusting her medications and monitoring her  kidney function.  Patient reports that earlier in the year her former primary care discontinued her Lasix  and started her on hydrochlorothiazide , as a result she started having increased lower extremity edema.  Patient reports that she presented to [facility] with complaints of shortness of breath, did have lower extremity edema however had  worsening shortness of breath related to her COPD exacerbation in setting of bronchitis, she has noted improvement since discharge.  She has been continued on Lasix  20 mg daily following her discharge from the hospital.  Patient has been on oxygen  when ambulating and nocturnally, denies any recent changes. ROS: .   Today she denies chest pain, fatigue, palpitations, melena, hematuria, hemoptysis, diaphoresis, weakness, presyncope, syncope, orthopnea, and PND.  All other systems are reviewed and otherwise negative. Studies Reviewed: SABRA   EKG:  EKG is ordered today, personally reviewed, demonstrating  EKG Interpretation Date/Time:  Monday February 21 2024 14:23:14 EDT Ventricular Rate:  61 PR Interval:  160 QRS Duration:  100 QT Interval:  416 QTC Calculation: 418 R Axis:   -36  Text Interpretation: Normal sinus rhythm Left axis deviation Incomplete right bundle branch block When compared with ECG of 30-Nov-2021 08:13, Vent. rate has decreased BY  30 BPM T wave inversion no longer evident in Inferior leads Nonspecific T wave abnormality no longer evident in Anterolateral leads Confirmed by Ragan Duhon [phone]) on 02/21/2024 5:33:30 PM   CV Studies: Cardiac studies reviewed are outlined and summarized above. Otherwise please see EMR for full report. Cardiac Studies & Procedures   ______________________________________________________________________________________________ CARDIAC CATHETERIZATION  CARDIAC CATHETERIZATION 10/07/2020  Conclusion 1.  Patent left mainstem 2.  Patent LAD, supplies a large territory with 2 diagonal branches and wraps around the LV apex with no obstructive disease throughout its distribution 3.  Patent left circumflex with chronic occlusion of the first OM branch, late filling is seen in previous cardiac catheterization study 4.  Chronic occlusion of the mid RCA with extensive left-to-right collaterals supplied by an epicardial circumflex branch and septal perforating  branches of the LAD 5.  Normal LV function with normal wall motion, no regional wall motion abnormalities.  Recommendations: Favor ongoing medical therapy.  Appears to have stable coronary anatomy from old catheterization 10 years ago.  Will ask CTO specialist to review her films to see if there would be treatment options for refractory angina.  She is already on aggressive medical therapy with amlodipine , isosorbide , and metoprolol .  Findings Coronary Findings Diagnostic  Dominance: Right  Left Anterior Descending There is mild diffuse disease throughout the vessel. This is a large vessel that wraps around the left ventricular apex.  There is diffuse nonobstructive plaquing present without any significant stenosis.  The diagonal branches are patent with mild plaquing and no significant stenosis  Ramus Intermedius The ramus intermedius has no significant obstruction  Left Circumflex  First Obtuse Marginal Branch 1st Mrg lesion is 100% stenosed. The first OM is occluded.  The vessel appears to give off to subbranches that are moderate in caliber and these branches fill late  Right Coronary Artery Prox RCA to Mid RCA lesion is 100% stenosed. Chronic occlusion, unchanged from prior cardiac catheterization study in 2012  Right Posterior Atrioventricular Artery The major collateral source appears to be an epicardial collateral from the AV circumflex but there are also faint septal collaterals from the LAD present. Collaterals RPAV filled by collaterals from Dist Cx.  Third Right Posterolateral Branch Collaterals 3rd RPL filled by collaterals from 1st Sept.  Intervention  No  interventions have been documented.     ECHOCARDIOGRAM  ECHOCARDIOGRAM COMPLETE 02/05/2021  Narrative ECHOCARDIOGRAM REPORT    Patient Name:   [name] Date of Exam: 02/05/2021 Medical Rec #:  [id number]       Height:       63.0 in Accession #:    [id number]      Weight:       224.0 lb Date of  Birth:  05-15-1956       BSA:          2.029 m Patient Age:    65 years        BP:           122/61 mmHg Patient Gender: F               HR:           74 bpm. Exam Location:  [address]  Procedure: 2D Echo, Cardiac Doppler and Color Doppler  Indications:    I25.118 CAD  History:        Patient has no prior history of Echocardiogram examinations. CAD, COPD; Risk Factors:Hypertension, Diabetes, Dyslipidemia and Morbid obesity.  Sonographer:    Elsie Bohr RDCS Referring Phys: [id number] HEATHER E PEMBERTON   Sonographer Comments: Technically difficult study due to poor echo windows and patient is morbidly obese. Image acquisition challenging due to patient body habitus and Image acquisition challenging due to COPD. IMPRESSIONS   1. Left ventricular ejection fraction, by estimation, is 60 to 65%. The left ventricle has normal function. The left ventricle has no regional wall motion abnormalities. There is mild concentric left ventricular hypertrophy. Left ventricular diastolic parameters are consistent with Grade I diastolic dysfunction (impaired relaxation). Elevated left atrial pressure. 2. Right ventricular systolic function is normal. The right ventricular size is normal. There is normal pulmonary artery systolic pressure. The estimated right ventricular systolic pressure is 35.3 mmHg. 3. The mitral valve is normal in structure. Trivial mitral valve regurgitation. 4. The aortic valve is tricuspid. There is mild calcification of the aortic valve. There is mild thickening of the aortic valve. Aortic valve regurgitation is not visualized. Mild to moderate aortic valve sclerosis/calcification is present, without any evidence of aortic stenosis. 5. The inferior vena cava is normal in size with greater than 50% respiratory variability, suggesting right atrial pressure of 3 mmHg.  Comparison(s): No prior Echocardiogram.  FINDINGS Left Ventricle: Left ventricular ejection fraction, by  estimation, is 60 to 65%. The left ventricle has normal function. The left ventricle has no regional wall motion abnormalities. The left ventricular internal cavity size was normal in size. There is mild concentric left ventricular hypertrophy. Left ventricular diastolic parameters are consistent with Grade I diastolic dysfunction (impaired relaxation). Elevated left atrial pressure.  Right Ventricle: The right ventricular size is normal. Right vetricular wall thickness was not well visualized. Right ventricular systolic function is normal. There is normal pulmonary artery systolic pressure. The tricuspid regurgitant velocity is 2.84 m/s, and with an assumed right atrial pressure of 3 mmHg, the estimated right ventricular systolic pressure is 35.3 mmHg.  Left [facility]: Left atrial size was normal in size.  Right [facility]: Right atrial size was normal in size.  Pericardium: There is no evidence of pericardial effusion. Presence of pericardial fat pad.  Mitral Valve: The mitral valve is normal in structure. There is mild thickening of the mitral valve leaflet(s). There is mild calcification of the mitral valve leaflet(s). Trivial mitral valve regurgitation.  Tricuspid Valve: The tricuspid valve  is normal in structure. Tricuspid valve regurgitation is trivial.  Aortic Valve: The aortic valve is tricuspid. There is mild calcification of the aortic valve. There is mild thickening of the aortic valve. Aortic valve regurgitation is not visualized. Mild to moderate aortic valve sclerosis/calcification is present, without any evidence of aortic stenosis. Aortic valve mean gradient measures 9.0 mmHg. Aortic valve peak gradient measures 19.9 mmHg. Aortic valve area, by VTI measures 1.80 cm.  Pulmonic Valve: The pulmonic valve was normal in structure. Pulmonic valve regurgitation is trivial.  Aorta: The aortic root and ascending aorta are structurally normal, with no evidence of dilitation.  Venous: The  inferior vena cava is normal in size with greater than 50% respiratory variability, suggesting right atrial pressure of 3 mmHg.  IAS/Shunts: No atrial level shunt detected by color flow Doppler.   LEFT VENTRICLE PLAX 2D LVIDd:         4.80 cm  Diastology LVIDs:         3.30 cm  LV e' medial:    5.00 cm/s LV PW:         1.20 cm  LV E/e' medial:  22.0 LV IVS:        1.30 cm  LV e' lateral:   5.44 cm/s LVOT diam:     1.90 cm  LV E/e' lateral: 20.2 LV SV:         73 LV SV Index:   36 LVOT Area:     2.84 cm   RIGHT VENTRICLE             IVC RV S prime:     17.10 cm/s  IVC diam: 1.40 cm TAPSE (M-mode): 2.0 cm RVSP:           35.3 mmHg  LEFT ATRIUM             Index       RIGHT ATRIUM           Index LA diam:        4.80 cm 2.37 cm/m  RA Pressure: 3.00 mmHg LA Vol (A2C):   52.5 ml 25.87 ml/m RA Area:     12.70 cm LA Vol (A4C):   55.1 ml 27.16 ml/m RA Volume:   29.60 ml  14.59 ml/m LA Biplane Vol: 56.3 ml 27.75 ml/m AORTIC VALVE AV Area (Vmax):    1.70 cm AV Area (Vmean):   1.69 cm AV Area (VTI):     1.80 cm AV Vmax:           223.00 cm/s AV Vmean:          135.000 cm/s AV VTI:            0.406 m AV Peak Grad:      19.9 mmHg AV Mean Grad:      9.0 mmHg LVOT Vmax:         134.00 cm/s LVOT Vmean:        80.300 cm/s LVOT VTI:          0.258 m LVOT/AV VTI ratio: 0.64  AORTA Ao Root diam: 2.60 cm Ao Asc diam:  3.20 cm  MITRAL VALVE                TRICUSPID VALVE MV Area (PHT): 2.20 cm     TR Peak grad:   32.3 mmHg MV Decel Time: 345 msec     TR Vmax:        284.00 cm/s MV E velocity: 110.00 cm/s  Estimated RAP:  3.00 mmHg MV A velocity: 124.00 cm/s  RVSP:           35.3 mmHg MV E/A ratio:  0.89 SHUNTS Systemic VTI:  0.26 m Systemic Diam: 1.90 cm  Powell Sorrow MD Electronically signed by Powell Sorrow MD Signature Date/Time: 02/05/2021/5:10:45 PM    Final           ______________________________________________________________________________________________       Current Reported Medications:.    Current Meds  Medication Sig   albuterol  (VENTOLIN  HFA) 108 (90 Base) MCG/ACT inhaler Inhale 2 puffs into the lungs every 6 (six) hours as needed for wheezing or shortness of breath.   Alcohol Swabs (B-D SINGLE USE SWABS REGULAR) PADS check blood sugars twice daily Dx E11.9   aspirin  EC 81 MG tablet Take 1 tablet (81 mg total) by mouth daily. Swallow whole.   atorvastatin  (LIPITOR) 80 MG tablet Take 1 tablet (80 mg total) by mouth at bedtime.   Biotin 2.5 MG CAPS Take by mouth.   budeson-glycopyrrolate -formoterol  (BREZTRI  AEROSPHERE) 160-9-4.8 MCG/ACT AERO inhaler Inhale 2 puffs into the lungs in the morning and at bedtime.   Cholecalciferol (VITAMIN D3) 5000 units TABS Take 10,000 Units by mouth every evening.   cloNIDine  (CATAPRES ) 0.3 MG tablet Take 1 tablet (0.3 mg total) by mouth 2 (two) times daily.   escitalopram  (LEXAPRO ) 20 MG tablet Take 1 tablet (20 mg total) by mouth daily.   fish oil-omega-3 fatty acids 1000 MG capsule Take 1,000 mg by mouth at bedtime.   furosemide  (LASIX ) 20 MG tablet Take 20 mg by mouth.   gabapentin  (NEURONTIN ) 100 MG capsule Take 1 capsule (100 mg total) by mouth at bedtime.   hydrOXYzine  (ATARAX ) 50 MG tablet Take 25-100 mg by mouth.   lisinopril  (ZESTRIL ) 20 MG tablet Take 20 mg by mouth daily.   metFORMIN  (GLUCOPHAGE ) 500 MG tablet Take 1 tablet (500 mg total) by mouth 2 (two) times daily with a meal.   metoprolol  tartrate (LOPRESSOR ) 25 MG tablet Take 1 tablet (25 mg total) by mouth 2 (two) times daily.   pramipexole  (MIRAPEX ) 1 MG tablet Take 1 tablet (1 mg total) by mouth in the morning and at bedtime.   vitamin B-12 (CYANOCOBALAMIN ) 1000 MCG tablet Take 1 tablet (1,000 mcg total) by mouth daily.   Physical Exam:   VS:  BP 110/66   Pulse 61   Ht 5' 4 (1.626 m)   Wt 221 lb (100.2 kg)   LMP 10/17/2005    SpO2 98%   BMI 37.93 kg/m    Wt Readings from Last 3 Encounters:  02/21/24 221 lb (100.2 kg)  11/05/23 221 lb (100.2 kg)  08/17/23 220 lb (99.8 kg)    GEN: Well nourished, well developed in no acute distress NECK: No JVD; No carotid bruits CARDIAC: RRR, no murmurs, rubs, gallops RESPIRATORY:  Clear to auscultation without rales, wheezing or rhonchi  ABDOMEN: Soft, non-tender, non-distended EXTREMITIES:  No edema; No acute deformity     Asessement and Plan:.    HFpEF: TTE on 01/2021 with EF 66 5%, G1 DD, trivial MR. Patient admitted to [facility] on 01/26/2024 with increased lower extremity edema and COPD exacerbation.  Treated with Lasix  and oral steroids.  Patient reports that few months ago her former PCP stopped her Lasix  and started her on hydrochlorothiazide  resulting in increased lower extremity edema.  She has noted improvement with being on Lasix  20 mg daily, reports that her breathing is overall stable.  Continue Lasix  20  mg daily, patient reports that she has follow-up lab work later this week with her PCP given fluctuations in renal function.  Will check echocardiogram to reevaluate HFpEF.   CAD:S/p BMS in 2012 to RCA, known occlusion of OM, mild to moderate LAD disease.  Repeat coronary angiography on 10/07/2020 showed stable CAD with chronic CTO of RCA and OM with collateral flow and no new lesions, recommended for continued medical management. Stable with no anginal symptoms. No indication for ischemic evaluation.  Continue aspirin  81 mg daily and Lipitor 80 mg daily.  Hypertension: Blood pressure today 110/66.  Monitored and managed per PCP.  Patient is currently on clonidine  0.3 mg twice daily, lisinopril  20 mg daily and metoprolol  tartrate 25 mg twice daily.  Patient notes prior problems with potassium levels, unclear if related to prior of spironolactone  use.  Hyperlipidemia: Last lipid profile on 11/06/2023 indicated LDL 56.  Continue atorvastatin  80 mg daily.  COPD: Patient with  known history of COPD, on oxygen  when ambulatory and at night.  Patient reports that she has not seen her pulmonologist in the last year, recommended follow-up.  Prior tobacco use: Patient reports that she stopped smoking 2 years ago.  Congratulated.   Disposition: F/u with Kaislyn Gulas, NP in six weeks   Signed, Delwyn Scoggin D Bedie Dominey, NP

## 2024-02-21 ENCOUNTER — Ambulatory Visit: Attending: Cardiology | Admitting: Cardiology

## 2024-02-21 ENCOUNTER — Encounter: Payer: Self-pay | Admitting: Cardiology

## 2024-02-21 VITALS — BP 110/66 | HR 61 | Ht 64.0 in | Wt 221.0 lb

## 2024-02-21 DIAGNOSIS — I10 Essential (primary) hypertension: Secondary | ICD-10-CM

## 2024-02-21 DIAGNOSIS — R0602 Shortness of breath: Secondary | ICD-10-CM | POA: Diagnosis not present

## 2024-02-21 DIAGNOSIS — J449 Chronic obstructive pulmonary disease, unspecified: Secondary | ICD-10-CM

## 2024-02-21 DIAGNOSIS — N1831 Chronic kidney disease, stage 3a: Secondary | ICD-10-CM | POA: Diagnosis not present

## 2024-02-21 DIAGNOSIS — I5032 Chronic diastolic (congestive) heart failure: Secondary | ICD-10-CM

## 2024-02-21 DIAGNOSIS — Z9861 Coronary angioplasty status: Secondary | ICD-10-CM

## 2024-02-21 DIAGNOSIS — R600 Localized edema: Secondary | ICD-10-CM | POA: Diagnosis not present

## 2024-02-21 DIAGNOSIS — E782 Mixed hyperlipidemia: Secondary | ICD-10-CM

## 2024-02-21 DIAGNOSIS — I2510 Atherosclerotic heart disease of native coronary artery without angina pectoris: Secondary | ICD-10-CM

## 2024-02-21 NOTE — Patient Instructions (Addendum)
 Medication Instructions:  No changes *If you need a refill on your cardiac medications before your next appointment, please call your pharmacy*  Lab Work: No labs  Testing/Procedures: Your physician has requested that you have an echocardiogram. Echocardiography is a painless test that uses sound waves to create images of your heart. It provides your doctor with information about the size and shape of your heart and how well your heart's chambers and valves are working. This procedure takes approximately one hour. There are no restrictions for this procedure. Please do NOT wear cologne, perfume, aftershave, or lotions (deodorant is allowed). Please arrive 15 minutes prior to your appointment time.  Please note: We ask at that you not bring children with you during ultrasound (echo/ vascular) testing. Due to room size and safety concerns, children are not allowed in the ultrasound rooms during exams. Our front office staff cannot provide observation of children in our lobby area while testing is being conducted. An adult accompanying a patient to their appointment will only be allowed in the ultrasound room at the discretion of the ultrasound technician under special circumstances. We apologize for any inconvenience.  Follow-Up: At [facility], you and your health needs are our priority.  As part of our continuing mission to provide you with exceptional heart care, our providers are all part of one team.  This team includes your primary Cardiologist (physician) and Advanced Practice Providers or APPs (Physician Assistants and Nurse Practitioners) who all work together to provide you with the care you need, when you need it.  Your next appointment:   8 week(s)  Provider:   Katlyn West, NP   We recommend signing up for the patient portal called MyChart.  Sign up information is provided on this After Visit Summary.  MyChart is used to connect with patients for Virtual Visits  (Telemedicine).  Patients are able to view lab/test results, encounter notes, upcoming appointments, etc.  Non-urgent messages can be sent to your provider as well.   To learn more about what you can do with MyChart, go to [email or web address].

## 2024-03-13 ENCOUNTER — Telehealth: Payer: Self-pay | Admitting: Cardiology

## 2024-03-13 NOTE — Telephone Encounter (Signed)
 Pt c/o medication issue:  1. Name of Medication:   furosemide  (LASIX ) 20 MG tablet    2. How are you currently taking this medication (dosage and times per day)?  Take 20 mg by mouth.      3. Are you having a reaction (difficulty breathing--STAT)? No  4. What is your medication issue? Berdie is requesting a callback regarding pcp wanting to discuss how pt should continue to take this medication. Please advise

## 2024-03-13 NOTE — Telephone Encounter (Signed)
 Spoke with [name] who states that the patient was seen today and her Lasix  was increased to 20 mg twice daily and she was advised to hold hydrochlorothiazide . She was advised to continue clonidine , lisinopril , and metoprolol . Advised that I will update her med list and let the provider know.

## 2024-03-29 ENCOUNTER — Ambulatory Visit (HOSPITAL_COMMUNITY)
Admission: RE | Admit: 2024-03-29 | Discharge: 2024-03-29 | Disposition: A | Discharge status: Home / Self Care | Source: Ambulatory Visit | Attending: Internal Medicine | Admitting: Internal Medicine

## 2024-03-29 DIAGNOSIS — R0602 Shortness of breath: Secondary | ICD-10-CM | POA: Diagnosis present

## 2024-03-29 DIAGNOSIS — R600 Localized edema: Secondary | ICD-10-CM | POA: Diagnosis not present

## 2024-03-29 LAB — ECHOCARDIOGRAM COMPLETE
AR max vel: 1.28 cm2
AV Area VTI: 1.38 cm2
AV Area mean vel: 1.24 cm2
AV Mean grad: 9 mmHg
AV Peak grad: 17.5 mmHg
Ao pk vel: 2.09 m/s
Area-P 1/2: 2.73 cm2
S' Lateral: 2.8 cm

## 2024-03-31 ENCOUNTER — Ambulatory Visit: Payer: Self-pay | Admitting: Cardiology

## 2024-04-06 ENCOUNTER — Other Ambulatory Visit: Payer: Self-pay | Admitting: Family Medicine

## 2024-04-06 DIAGNOSIS — Z1231 Encounter for screening mammogram for malignant neoplasm of breast: Secondary | ICD-10-CM

## 2024-04-18 ENCOUNTER — Ambulatory Visit: Admitting: Cardiology

## 2024-04-22 NOTE — Progress Notes (Unsigned)
 Cardiology Office Note    Date:  04/24/2024  ID:  [name], DOB 09-03-1955, MRN [id number] PCP:  Lynwood Laneta ORN, PA-C  Cardiologist:  Newman JINNY Lawrence, MD  Electrophysiologist:  None   Chief Complaint: Follow up for HFpEF   History of Present Illness: .    [name] is a 68 y.o. female with visit-pertinent history of anxiety, breast cancer, CAD with NSTEMI in 2008, hypertension, hyperlipidemia, type 2 diabetes mellitus, bilateral carotid stenosis, COPD and depression.  Patient underwent coronary stenting in 2000 with a BMS in the right coronary artery.  Patient had preoperative stress test in 2012 for lumpectomy.  This demonstrated inferolateral ischemia and she was referred for cardiac catheterization which showed total occlusion of RCA with left-to-right collaterals.  There was also notation of chronic occlusion of the left circumflex which was present on previous study in 2008.  She had mild to moderate disease in the LAD.  At her visit in 2012 she had mild anginal symptoms and was managed medically.  Patient establish care with Dr. Hobart at visit in 09/2020.  She felt more fatigue with decreased exercise capacity, worsening dyspnea on exertion and mild chest discomfort.  She underwent LHC on 10/07/2020 which showed stable coronary anatomy with CTO of RCA recommended for continued medical management.  At visit in 11/2020 she was continued to feel tired and was worried about suffering from depression.  She had continued shortness of breath.  TTE on 02/05/2021 indicated LVEF 60 to 65%, G1 DD, normal PASP, aortic sclerosis, RAP 3.  Patient was last seen in clinic on 06/16/2021, she reported that her blood pressure had been spiking at home as high as 190.  She was started on clonidine  0.1 mg twice daily by her PCP.  She had noted some improvement but continued of spikes during the day.  Prior to appointment Dr. Hobart had increased her Lasix  to 40 mg twice daily and told her to  avoid sodium.  Following that her blood pressures had improved to 120-130 and her abdominal bloating resolved.  Patient continued to have significant dyspnea on exertion however it was noted she was a current smoker, her O2 saturation while at rest was 89% while in office and had not followed regularly with pulmonology.  Patient was referred to pulmonology for COPD patient was started on spironolactone  25 mg daily, clonidine  was discontinued given risk of rebound hypertension and was referred to hypertension clinic.  Patient was then lost to follow-up.  On chart review on 01/26/2024 patient presented to [facility] with complaints of shortness of breath and foot swelling.  She reported bilateral leg swelling for 2 weeks, noted to have 1+ peripheral pitting edema bilaterally, also felt to have COPD exacerbation.  EKG showed sinus rhythm at 65 bpm, no ischemic changes.  Per notes patient was treated with steroids.  Chest x-ray showed minimal capsular atelectasis with otherwise clear lungs, hyperinflation, cardiomegaly, aortic arch atherosclerotic calcifications.  Patient was discharged with supplemental oxygen  to use on exertion and prescribed prednisone  20 mg daily for 3 days as well as furosemide  for 3 days.  Patient was seen in clinic on 02/21/2024 for follow-up.  She reports that she been doing well overall.  She reported stable dyspnea on exertion, noted that she had history of COPD and had not been following with her pulmonologist.  She reported that her previous primary care had discontinued her Lasix  and started her on hydrochlorothiazide , as a result she started having increased lower extremity  edema.  Patient noted that with her ED presentation she did have some increased lower extremity edema however had worsening shortness of breath related to her COPD exacerbation in setting of bronchitis, had noted significant proven since discharge.  Echocardiogram was ordered for further evaluation.  Echocardiogram on  03/29/2024 indicated LVEF 65 to 70%, no RWMA, mild concentric LVH, G1 DD, RV systolic function and size was normal, LA was mild to moderately dilated, trivial mitral regurgitation, mild calcification of the aortic valve without presence of stenosis, aortic valve regurgitation was not visualized.  Today she presents for follow-up.  She reports that she has been doing well overall.  She denies any chest pain, reports that her breathing is overall stable.  Patient reports that she uses 2 L nasal cannula on occasion however does not use it 24/7.  She is planning to follow-up with her pulmonologist this week, she has noted some increased lung congestion with coughing up of mucus that is foul tasting.  She reports that she has not been using her daily or rescue inhalers in the last few weeks, notes that she does have at home, recommended that she use her inhalers as prescribed.  Patient reports that her lower extremity swelling has improved, notes some occasional right ankle swelling.  She denies any chest pain, orthopnea or PND.  She denies any significant palpitations, presyncope or syncope.  Notes that her blood pressure has overall been well-controlled.  She has regularly been following with her PCP at [facility]. ROS: .   Today she denies chest pain, fatigue, palpitations, melena, hematuria, hemoptysis, diaphoresis, weakness, presyncope, syncope, orthopnea, and PND.  All other systems are reviewed and otherwise negative. Studies Reviewed: [name]   EKG:  EKG is not ordered today.  CV Studies: Cardiac studies reviewed are outlined and summarized above. Otherwise please see EMR for full report. Cardiac Studies & Procedures   ______________________________________________________________________________________________ CARDIAC CATHETERIZATION  CARDIAC CATHETERIZATION 10/07/2020  Conclusion 1.  Patent left mainstem 2.  Patent LAD, supplies a large territory with 2 diagonal branches and wraps around the LV apex with  no obstructive disease throughout its distribution 3.  Patent left circumflex with chronic occlusion of the first OM branch, late filling is seen in previous cardiac catheterization study 4.  Chronic occlusion of the mid RCA with extensive left-to-right collaterals supplied by an epicardial circumflex branch and septal perforating branches of the LAD 5.  Normal LV function with normal wall motion, no regional wall motion abnormalities.  Recommendations: Favor ongoing medical therapy.  Appears to have stable coronary anatomy from old catheterization 10 years ago.  Will ask CTO specialist to review her films to see if there would be treatment options for refractory angina.  She is already on aggressive medical therapy with amlodipine , isosorbide , and metoprolol .  Findings Coronary Findings Diagnostic  Dominance: Right  Left Anterior Descending There is mild diffuse disease throughout the vessel. This is a large vessel that wraps around the left ventricular apex.  There is diffuse nonobstructive plaquing present without any significant stenosis.  The diagonal branches are patent with mild plaquing and no significant stenosis  Ramus Intermedius The ramus intermedius has no significant obstruction  Left Circumflex  First Obtuse Marginal Branch 1st Mrg lesion is 100% stenosed. The first OM is occluded.  The vessel appears to give off to subbranches that are moderate in caliber and these branches fill late  Right Coronary Artery Prox RCA to Mid RCA lesion is 100% stenosed. Chronic occlusion, unchanged from prior cardiac catheterization  study in 2012  Right Posterior Atrioventricular Artery The major collateral source appears to be an epicardial collateral from the AV circumflex but there are also faint septal collaterals from the LAD present. Collaterals RPAV filled by collaterals from Dist Cx.  Third Right Posterolateral Branch Collaterals 3rd RPL filled by collaterals from 1st  Sept.  Intervention  No interventions have been documented.     ECHOCARDIOGRAM  ECHOCARDIOGRAM COMPLETE 03/29/2024  Narrative ECHOCARDIOGRAM REPORT    Patient Name:   [name] Date of Exam: 03/29/2024 Medical Rec #:  [id number]       Height:       64.0 in Accession #:    [id number]      Weight:       221.0 lb Date of Birth:  1956-05-26       BSA:          2.041 m Patient Age:    68 years        BP:           164/77 mmHg Patient Gender: F               HR:           51 bpm. Exam Location:  [address]  Procedure: 2D Echo, Cardiac Doppler and Color Doppler (Both Spectral and Color Flow Doppler were utilized during procedure).  Indications:    SOB R06.02  History:        Patient has prior history of Echocardiogram examinations. Previous Myocardial Infarction, Signs/Symptoms:Edema and Morbidly Obese; Risk Factors:Hypertension and Hyperlipidemia.  Sonographer:    Cherene Ravens RDCS Referring Phys: Adlyn Fife D Kamalei Roeder   Sonographer Comments: Patient is obese. IMPRESSIONS   1. Left ventricular ejection fraction, by estimation, is 65 to 70%. The left ventricle has normal function. The left ventricle has no regional wall motion abnormalities. There is mild concentric left ventricular hypertrophy. Left ventricular diastolic parameters are consistent with Grade I diastolic dysfunction (impaired relaxation). 2. Right ventricular systolic function is normal. The right ventricular size is normal. 3. Left atrial size was mild to moderately dilated. 4. The mitral valve is normal in structure. Trivial mitral valve regurgitation. 5. The aortic valve is tricuspid. There is mild calcification of the aortic valve. Aortic valve regurgitation is not visualized. Aortic valve sclerosis/calcification is present, without any evidence of aortic stenosis. 6. The inferior vena cava is normal in size with greater than 50% respiratory variability, suggesting right atrial pressure of 3  mmHg.  Comparison(s): No significant change from prior study. Prior images reviewed side by side.  FINDINGS Left Ventricle: Left ventricular ejection fraction, by estimation, is 65 to 70%. The left ventricle has normal function. The left ventricle has no regional wall motion abnormalities. The left ventricular internal cavity size was normal in size. There is mild concentric left ventricular hypertrophy. Left ventricular diastolic parameters are consistent with Grade I diastolic dysfunction (impaired relaxation).  Right Ventricle: The right ventricular size is normal. No increase in right ventricular wall thickness. Right ventricular systolic function is normal.  Left [facility]: Left atrial size was mild to moderately dilated.  Right [facility]: Right atrial size was normal in size.  Pericardium: There is no evidence of pericardial effusion.  Mitral Valve: The mitral valve is normal in structure. Trivial mitral valve regurgitation.  Tricuspid Valve: The tricuspid valve is normal in structure. Tricuspid valve regurgitation is trivial.  Aortic Valve: The aortic valve is tricuspid. There is mild calcification of the aortic valve. Aortic valve regurgitation is  not visualized. Aortic valve sclerosis/calcification is present, without any evidence of aortic stenosis. Aortic valve mean gradient measures 9.0 mmHg. Aortic valve peak gradient measures 17.5 mmHg. Aortic valve area, by VTI measures 1.38 cm.  Pulmonic Valve: The pulmonic valve was grossly normal. Pulmonic valve regurgitation is not visualized. No evidence of pulmonic stenosis.  Aorta: The aortic root is normal in size and structure.  Venous: The inferior vena cava is normal in size with greater than 50% respiratory variability, suggesting right atrial pressure of 3 mmHg.  IAS/Shunts: The interatrial septum was not well visualized.   LEFT VENTRICLE PLAX 2D LVIDd:         4.50 cm   Diastology LVIDs:         2.80 cm   LV e' medial:     6.53 cm/s LV PW:         1.30 cm   LV E/e' medial:  9.0 LV IVS:        1.10 cm   LV e' lateral:   6.53 cm/s LVOT diam:     1.80 cm   LV E/e' lateral: 9.0 LV SV:         66 LV SV Index:   32 LVOT Area:     2.54 cm   RIGHT VENTRICLE RV S prime:     10.30 cm/s TAPSE (M-mode): 1.6 cm RVSP:           30.7 mmHg  LEFT ATRIUM             Index        RIGHT A[facility] LA diam:        4.20 cm 2.06 cm/m   RA Pressure: 3.00 mmHg LA Vol (A2C):   65.7 ml 32.19 ml/m LA Vol (A4C):   71.9 ml 35.22 ml/m LA Biplane Vol: 70.8 ml 34.69 ml/m AORTIC VALVE AV Area (Vmax):    1.28 cm AV Area (Vmean):   1.24 cm AV Area (VTI):     1.38 cm AV Vmax:           209.00 cm/s AV Vmean:          140.000 cm/s AV VTI:            0.480 m AV Peak Grad:      17.5 mmHg AV Mean Grad:      9.0 mmHg LVOT Vmax:         105.00 cm/s LVOT Vmean:        68.100 cm/s LVOT VTI:          0.260 m LVOT/AV VTI ratio: 0.54  MITRAL VALVE                TRICUSPID VALVE MV Area (PHT): 2.73 cm     TR Peak grad:   27.7 mmHg MV Decel Time: 278 msec     TR Vmax:        263.00 cm/s MV E velocity: 58.70 cm/s   Estimated RAP:  3.00 mmHg MV A velocity: 101.00 cm/s  RVSP:           30.7 mmHg MV E/A ratio:  0.58 SHUNTS Systemic VTI:  0.26 m Systemic Diam: 1.80 cm  Jerel Croitoru MD Electronically signed by Jerel Balding MD Signature Date/Time: 03/29/2024/3:20:16 PM    Final          ______________________________________________________________________________________________       Current Reported Medications:.    Current Meds  Medication Sig   albuterol  (VENTOLIN  HFA) 108 (90 Base)  MCG/ACT inhaler Inhale 2 puffs into the lungs every 6 (six) hours as needed for wheezing or shortness of breath.   Alcohol Swabs (B-D SINGLE USE SWABS REGULAR) PADS check blood sugars twice daily Dx E11.9   aspirin  EC 81 MG tablet Take 1 tablet (81 mg total) by mouth daily. Swallow whole.   atorvastatin  (LIPITOR) 80 MG tablet Take 1  tablet (80 mg total) by mouth at bedtime.   Biotin 2.5 MG CAPS Take by mouth.   budeson-glycopyrrolate -formoterol  (BREZTRI  AEROSPHERE) 160-9-4.8 MCG/ACT AERO inhaler Inhale 2 puffs into the lungs in the morning and at bedtime.   Cholecalciferol (VITAMIN D3) 5000 units TABS Take 10,000 Units by mouth every evening.   cloNIDine  (CATAPRES ) 0.3 MG tablet Take 1 tablet (0.3 mg total) by mouth 2 (two) times daily.   fish oil-omega-3 fatty acids 1000 MG capsule Take 1,000 mg by mouth at bedtime.   furosemide  (LASIX ) 20 MG tablet Take 20 mg by mouth. (Patient taking differently: Take 20 mg by mouth 2 (two) times daily.)   gabapentin  (NEURONTIN ) 100 MG capsule Take 1 capsule (100 mg total) by mouth at bedtime.   hydrochlorothiazide  (HYDRODIURIL ) 25 MG tablet Take 1 tablet (25 mg total) by mouth daily.   hydrOXYzine  (ATARAX ) 50 MG tablet Take 25-100 mg by mouth.   lisinopril  (ZESTRIL ) 20 MG tablet Take 20 mg by mouth daily.   lisinopril  (ZESTRIL ) 40 MG tablet Take 1 tablet (40 mg total) by mouth daily.   metFORMIN  (GLUCOPHAGE ) 500 MG tablet Take 1 tablet (500 mg total) by mouth 2 (two) times daily with a meal.   metFORMIN  (GLUCOPHAGE -XR) 500 MG 24 hr tablet Take 500 mg by mouth 2 (two) times daily with a meal.   metoprolol  tartrate (LOPRESSOR ) 25 MG tablet Take 1 tablet (25 mg total) by mouth 2 (two) times daily.   pramipexole  (MIRAPEX ) 1 MG tablet Take 1 tablet (1 mg total) by mouth in the morning and at bedtime.   sertraline (ZOLOFT) 100 MG tablet Take 100 mg by mouth daily.   vitamin B-12 (CYANOCOBALAMIN ) 1000 MCG tablet Take 1 tablet (1,000 mcg total) by mouth daily.    Physical Exam:    VS:  BP (!) 112/58   Pulse 66   Ht 5' 4 (1.626 m)   Wt 213 lb (96.6 kg)   LMP 10/17/2005   SpO2 99%   BMI 36.56 kg/m    Wt Readings from Last 3 Encounters:  04/24/24 213 lb (96.6 kg)  02/21/24 221 lb (100.2 kg)  11/05/23 221 lb (100.2 kg)    GEN: Well nourished, well developed in no acute  distress NECK: No JVD; No carotid bruits CARDIAC: RRR, no murmurs, rubs, gallops RESPIRATORY:  Diminished bases without rales, wheezing or rhonchi  ABDOMEN: Soft, non-tender, non-distended EXTREMITIES:  Mild right ankle edema; No acute deformity     Asessement and Plan:.    HFpEF: Echocardiogram on 03/29/2024 indicated LVEF 65 to 70%, no RWMA, mild concentric LVH, G1 DD, RV systolic function and size was normal, LA was mild to moderately dilated, trivial mitral regurgitation, mild calcification of the aortic valve without presence of stenosis, aortic valve regurgitation was not visualized. Today she reports that her lower extremity edema has improved, notes some occasional right ankle edema, denies any significant worsening.  She reports that her shortness of breath has been stable, plans to follow-up with pulmonology later this week.  Continue lisinopril , will request clarification from patient's PCP regarding dose, metoprolol  tartrate 25 mg twice daily and Lasix   20 mg daily.  Review ED precautions.  CAD: S/p BMS in 2012 to RCA, known occlusion of OM, mild to moderate LAD disease.  Repeat coronary angiography on 10/07/2020 showed stable CAD with chronic CTO of RCA and OM with collateral flow and no new lesions, recommended for continued medical management. Today she denies chest pain, pressure or tightness.  Patient notes shortness of breath that is overall stable related to her history of COPD, uses 2 L nasal cannula on occasion, is planning to follow-up with pulmonology later this week. Reviewed ED precautions  Hypertension: Blood pressure today 112/58. Continue current antihypertensive regimen.   Hyperlipidemia: Last lipid profile on 11/05/23 indicated LDL 56.  Continue atorvastatin  80 mg daily.  COPD: Patient on 2L [place] per pulmonology mainly uses with exertion.  Patient notes increased mucus production, she reports that she has not been using her inhalers in recent weeks.  Encouraged patient to use  inhalers as prescribed.  Patient to follow up with her pulmonologist later this week.   Prior tobacco use: Patient reports that she quit smoking over two years ago, congratulated.     Disposition: F/u with Dr. El[place] in 3 months to establish care.   Signed, Klaryssa Fauth D Carlton Buskey, NP

## 2024-04-24 ENCOUNTER — Encounter: Payer: Self-pay | Admitting: Cardiology

## 2024-04-24 ENCOUNTER — Ambulatory Visit: Attending: Cardiology | Admitting: Cardiology

## 2024-04-24 VITALS — BP 112/58 | HR 66 | Ht 64.0 in | Wt 213.0 lb

## 2024-04-24 DIAGNOSIS — R600 Localized edema: Secondary | ICD-10-CM | POA: Diagnosis not present

## 2024-04-24 DIAGNOSIS — J449 Chronic obstructive pulmonary disease, unspecified: Secondary | ICD-10-CM

## 2024-04-24 DIAGNOSIS — I2510 Atherosclerotic heart disease of native coronary artery without angina pectoris: Secondary | ICD-10-CM | POA: Diagnosis not present

## 2024-04-24 DIAGNOSIS — I5032 Chronic diastolic (congestive) heart failure: Secondary | ICD-10-CM | POA: Diagnosis not present

## 2024-04-24 DIAGNOSIS — N1831 Chronic kidney disease, stage 3a: Secondary | ICD-10-CM

## 2024-04-24 DIAGNOSIS — R0602 Shortness of breath: Secondary | ICD-10-CM

## 2024-04-24 DIAGNOSIS — Z9861 Coronary angioplasty status: Secondary | ICD-10-CM

## 2024-04-24 NOTE — Patient Instructions (Signed)
 Medication Instructions:  Your physician recommends that you continue on your current medications as directed. Please refer to the Current Medication list given to you today.  *If you need a refill on your cardiac medications before your next appointment, please call your pharmacy*  Lab Work: None ordered If you have labs (blood work) drawn today and your tests are completely normal, you will receive your results only by: MyChart Message (if you have MyChart) OR A paper copy in the mail If you have any lab test that is abnormal or we need to change your treatment, we will call you to review the results.  Follow-Up: At [facility], you and your health needs are our priority.  As part of our continuing mission to provide you with exceptional heart care, our providers are all part of one team.  This team includes your primary Cardiologist (physician) and Advanced Practice Providers or APPs (Physician Assistants and Nurse Practitioners) who all work together to provide you with the care you need, when you need it.  Your next appointment:   3 month(s)  Provider:   Newman JINNY Lawrence, MD

## 2024-04-26 ENCOUNTER — Ambulatory Visit: Admitting: Pulmonary Disease

## 2024-04-26 ENCOUNTER — Encounter: Payer: Self-pay | Admitting: Pulmonary Disease

## 2024-04-26 ENCOUNTER — Ambulatory Visit (INDEPENDENT_AMBULATORY_CARE_PROVIDER_SITE_OTHER): Admitting: Pulmonary Disease

## 2024-04-26 VITALS — BP 146/74 | HR 70 | Ht 64.0 in | Wt 217.0 lb

## 2024-04-26 DIAGNOSIS — J9601 Acute respiratory failure with hypoxia: Secondary | ICD-10-CM

## 2024-04-26 DIAGNOSIS — J449 Chronic obstructive pulmonary disease, unspecified: Secondary | ICD-10-CM | POA: Diagnosis not present

## 2024-04-26 DIAGNOSIS — J432 Centrilobular emphysema: Secondary | ICD-10-CM

## 2024-04-26 MED ORDER — ARFORMOTEROL TARTRATE 15 MCG/2ML IN NEBU: 15.0000 ug | INHALATION_SOLUTION | Freq: Two times a day (BID) | RESPIRATORY_TRACT | 6 refills | Status: DC

## 2024-04-26 MED ORDER — ALBUTEROL SULFATE HFA 108 (90 BASE) MCG/ACT IN AERS: 2.0000 | INHALATION_SPRAY | Freq: Four times a day (QID) | RESPIRATORY_TRACT | 11 refills | Status: AC | PRN

## 2024-04-26 MED ORDER — BUDESONIDE 0.5 MG/2ML IN SUSP: 0.5000 mg | Freq: Two times a day (BID) | RESPIRATORY_TRACT | 12 refills | Status: DC

## 2024-04-26 NOTE — Progress Notes (Addendum)
 Synopsis: Referred in November 2022 for shortness of breath by Marjie Lunger, NP  Subjective:   PATIENT ID: [name] GENDER: female DOB: Dec 10, 1955, MRN: [id number]  HPI  Chief Complaint  Patient presents with   Medical Management of Chronic Issues    PT states over due f/u    [name] is a 68 year old woman, former smoker with history of breast cancer, coronary artery disease, GERD, hypertension and hyperlipidemia who returns to pulmonary clinic for COPD.   She experiences increased mucus production, described as 'kind of dry', with difficulty expectorating. She was using Mucinex  but ran out a week ago and was taking it once daily instead of twice due to cost. There is no significant coughing today and only mild wheezing. The mucus has no noticeable color.  She uses inhalers for COPD but not as frequently as prescribed, finding them bothersome and difficult to inhale effectively. She is interested in trying a nebulizer as an alternative. Breztri  previously caused oral breakouts, though this occurs even without its use. She experiences some wheezing, less severe than past episodes.  She quit smoking in April 2020. A lung cancer screening CT is scheduled this month.  Past Medical History:  Diagnosis Date   Anxiety    Breast cancer (HCC) 06/30/2011   inv ductal, ER/PR +, her-2 -   CAD (coronary artery disease)    NSTEMI 4/08: OM3 occluded (PCI unsuccessful), dRCA 95% => BMS, inf HK, EF 50%;   b. MV 11/12:  [place] ischemia => c.  LHC 11/12:  dLAD 70%, OM1 70-80%, then occluded (no change from 2008), dOM filled L->L collats, mRCA stent occluded, dRCA filled L->R collats, EF 55-65% => med Rx (consider PCI of RCA if refractory angina)   Carotid stenosis    dopplers 5/08: 0-30% bilateral; 10/12: 0-39% B/L ICA => f/u 04/2013   Cataract 2023   both eye   Chronic kidney disease    COPD (chronic obstructive pulmonary disease) (HCC)    Depression    Depression with anxiety     Diabetes mellitus without complication (HCC)    Emphysema of lung (HCC)    GERD (gastroesophageal reflux disease)    OTC acid reducer prn   Headache(784.0)    sinus; occ. migraines   Hx of radiation therapy 08/24/11 to 10/07/11   L breast   Hyperlipidemia    Hypertension    under control; has been on med. x 4 yrs.   Myocardial infarction [facility])    Oxygen  deficiency    Intermittant   Stable angina    a. med Rx after cath 05/2011   Stress incontinence, female      Family History  Problem Relation Age of Onset   Heart disease Mother    Heart disease Father    Heart attack Sister    Bipolar disorder Sister    Cancer Maternal Aunt        pt unaware of what kind   Cancer Maternal Grandmother        ovarian   Thyroid  disease Daughter    Anxiety disorder Daughter    Depression Daughter    Colon cancer Neg Hx    Colon polyps Neg Hx    Esophageal cancer Neg Hx    Rectal cancer Neg Hx    Stomach cancer Neg Hx      Social History   Socioeconomic History   Marital status: Single    Spouse name: Not on file   Number of children:  1   Years of education: 12+   Highest education level: Some college, no degree  Occupational History   Occupation: retired  Tobacco Use   Smoking status: Former    Current packs/day: 0.00    Average packs/day: 1 pack/day for 45.0 years (45.0 ttl pk-yrs)    Types: Cigarettes    Start date: 11/13/1976    Quit date: 11/13/2021    Years since quitting: 2.4    Passive exposure: Past   Smokeless tobacco: Never   Tobacco comments:    Smoking Cessation Classes & Resources Offered.  Vaping Use   Vaping status: Never Used  Substance and Sexual Activity   Alcohol use: Yes    Alcohol/week: 1.0 standard drink of alcohol    Types: 1 Glasses of wine per week    Comment: occasionally   Drug use: No   Sexual activity: Not Currently    Birth control/protection: Post-menopausal  Other Topics Concern   Not on file  Social History Narrative   Lives at home  alone. She has a daughter and grandchildren that she interacts with often.    Worked at the [employer]   Trauma of having her sister pass in her arms at her home    Social Drivers of Health   Financial Resource Strain: Medium Risk (09/20/2023)   Overall Financial Resource Strain (CARDIA)    Difficulty of Paying Living Expenses: Somewhat hard  Food Insecurity: Food Insecurity Present (10/14/2023)   Hunger Vital Sign    Worried About Running Out of Food in the Last Year: Never true    Ran Out of Food in the Last Year: Sometimes true  Transportation Needs: No Transportation Needs (10/14/2023)   PRAPARE - Administrator, Civil Service (Medical): No    Lack of Transportation (Non-Medical): No  Physical Activity: Insufficiently Active (07/26/2023)   Exercise Vital Sign    Days of Exercise per Week: 3 days    Minutes of Exercise per Session: 30 min  Stress: No Stress Concern Present (07/26/2023)   [employer] of Occupational Health - Occupational Stress Questionnaire    Feeling of Stress : Only a little  Social Connections: Moderately Isolated (08/20/2023)   Social Connection and Isolation Panel    Frequency of Communication with Friends and Family: Three times a week    Frequency of Social Gatherings with Friends and Family: Twice a week    Attends Religious Services: 1 to 4 times per year    Active Member of [employer] or Organizations: No    Attends Banker Meetings: Never    Marital Status: Never married  Intimate Partner Violence: Not At Risk (10/14/2023)   Humiliation, Afraid, Rape, and Kick questionnaire    Fear of Current or Ex-Partner: No    Emotionally Abused: No    Physically Abused: No    Sexually Abused: No     Allergies  Allergen Reactions   Food Other (See Comments)    Nutmeg= difficulty breathing   Nutmeg Oil (Myristica Oil) Hives   Other Other (See Comments)    Nutmeg= difficulty breathing   Amlodipine  Swelling     Outpatient  Medications Prior to Visit  Medication Sig Dispense Refill   Alcohol Swabs (B-D SINGLE USE SWABS REGULAR) PADS check blood sugars twice daily Dx E11.9 200 each 0   aspirin  EC 81 MG tablet Take 1 tablet (81 mg total) by mouth daily. Swallow whole. 30 tablet 0   atorvastatin  (LIPITOR) 80 MG tablet Take 1  tablet (80 mg total) by mouth at bedtime. 90 tablet 1   Biotin 2.5 MG CAPS Take by mouth.     Cholecalciferol (VITAMIN D3) 5000 units TABS Take 10,000 Units by mouth every evening.     cloNIDine  (CATAPRES ) 0.3 MG tablet Take 1 tablet (0.3 mg total) by mouth 2 (two) times daily. 180 tablet 1   fish oil-omega-3 fatty acids 1000 MG capsule Take 1,000 mg by mouth at bedtime.     furosemide  (LASIX ) 20 MG tablet Take 20 mg by mouth. (Patient taking differently: Take 20 mg by mouth 2 (two) times daily.)     gabapentin  (NEURONTIN ) 100 MG capsule Take 1 capsule (100 mg total) by mouth at bedtime. 90 capsule 1   hydrOXYzine  (ATARAX ) 50 MG tablet Take 25-100 mg by mouth.     lisinopril  (ZESTRIL ) 40 MG tablet Take 1 tablet (40 mg total) by mouth daily. 90 tablet 3   metFORMIN  (GLUCOPHAGE ) 500 MG tablet Take 1 tablet (500 mg total) by mouth 2 (two) times daily with a meal. 180 tablet 1   metFORMIN  (GLUCOPHAGE -XR) 500 MG 24 hr tablet Take 500 mg by mouth 2 (two) times daily with a meal.     metoprolol  tartrate (LOPRESSOR ) 25 MG tablet Take 1 tablet (25 mg total) by mouth 2 (two) times daily. 180 tablet 1   pramipexole  (MIRAPEX ) 1 MG tablet Take 1 tablet (1 mg total) by mouth in the morning and at bedtime. 180 tablet 1   sertraline (ZOLOFT) 100 MG tablet Take 100 mg by mouth daily.     vitamin B-12 (CYANOCOBALAMIN ) 1000 MCG tablet Take 1 tablet (1,000 mcg total) by mouth daily. 30 tablet 0   albuterol  (VENTOLIN  HFA) 108 (90 Base) MCG/ACT inhaler Inhale 2 puffs into the lungs every 6 (six) hours as needed for wheezing or shortness of breath. 6.7 g 0   budeson-glycopyrrolate -formoterol  (BREZTRI  AEROSPHERE)  160-9-4.8 MCG/ACT AERO inhaler Inhale 2 puffs into the lungs in the morning and at bedtime. 3 each 3   lisinopril  (ZESTRIL ) 20 MG tablet Take 20 mg by mouth daily.     hydrochlorothiazide  (HYDRODIURIL ) 25 MG tablet Take 1 tablet (25 mg total) by mouth daily. (Patient not taking: Reported on 04/26/2024) 90 tablet 3   No facility-administered medications prior to visit.   Review of Systems  Constitutional:  Negative for chills, fever, malaise/fatigue and weight loss.  HENT:  Positive for congestion. Negative for sinus pain and sore throat.   Eyes: Negative.   Respiratory:  Positive for cough, sputum production, shortness of breath and wheezing. Negative for hemoptysis.   Cardiovascular:  Negative for chest pain, palpitations, orthopnea, claudication and leg swelling.  Gastrointestinal:  Negative for abdominal pain, heartburn, nausea and vomiting.  Genitourinary: Negative.   Musculoskeletal:  Negative for joint pain and myalgias.  Skin:  Negative for rash.  Neurological:  Negative for weakness.  Endo/Heme/Allergies: Negative.   Psychiatric/Behavioral: Negative.     Objective:   Vitals:   04/26/24 1421  BP: (!) 146/74  Pulse: 70  SpO2: 93%  Weight: 217 lb (98.4 kg)  Height: 5' 4 (1.626 m)   Physical Exam Constitutional:      General: She is not in acute distress.    Appearance: She is not ill-appearing.  HENT:     Head: Normocephalic and atraumatic.  Eyes:     General: No scleral icterus.    Conjunctiva/sclera: Conjunctivae normal.  Cardiovascular:     Rate and Rhythm: Normal rate and regular rhythm.  Pulses: Normal pulses.     Heart sounds: Normal heart sounds. No murmur heard. Pulmonary:     Effort: Pulmonary effort is normal.     Breath sounds: Decreased air movement present. Decreased breath sounds present. No wheezing, rhonchi or rales.  Musculoskeletal:     Right lower leg: No edema.     Left lower leg: No edema.  Skin:    General: Skin is warm and dry.   Neurological:     General: No focal deficit present.     Mental Status: She is alert.  Psychiatric:        Mood and Affect: Mood normal.        Behavior: Behavior normal.        Thought Content: Thought content normal.        Judgment: Judgment normal.    CBC    Component Value Date/Time   WBC 12.8 (H) 11/05/2023 1426   WBC 12.3 (H) 11/30/2021 0036   RBC 4.59 11/05/2023 1426   RBC 4.40 11/30/2021 0036   HGB 12.6 11/05/2023 1426   HGB 16.8 (H) 12/16/2011 1146   HCT 40.0 11/05/2023 1426   HCT 49.3 (H) 12/16/2011 1146   PLT 222 11/05/2023 1426   MCV 87 11/05/2023 1426   MCV 95.4 12/16/2011 1146   MCH 27.5 11/05/2023 1426   MCH 24.1 (L) 11/30/2021 0036   MCHC 31.5 11/05/2023 1426   MCHC 28.7 (L) 11/30/2021 0036   RDW 14.5 11/05/2023 1426   RDW 14.2 12/16/2011 1146   LYMPHSABS 1.0 11/05/2023 1426   LYMPHSABS 1.0 12/16/2011 1146   MONOABS 0.7 11/29/2021 1918   MONOABS 0.6 12/16/2011 1146   EOSABS 0.2 11/05/2023 1426   BASOSABS 0.1 11/05/2023 1426   BASOSABS 0.1 12/16/2011 1146      Latest Ref Rng & Units 11/05/2023    2:26 PM 08/17/2023    2:37 PM 05/17/2023    2:05 PM  BMP  Glucose 70 - 99 mg/dL 793  826  834   BUN 8 - 27 mg/dL 21  16  15    Creatinine 0.57 - 1.00 mg/dL 8.81  8.87  8.97   BUN/Creat Ratio 12 - 28 18  14  15    Sodium 134 - 144 mmol/L 140  143  144   Potassium 3.5 - 5.2 mmol/L 4.2  4.7  4.5   Chloride 96 - 106 mmol/L 98  103  103   CO2 20 - 29 mmol/L 25  25  25    Calcium  8.7 - 10.3 mg/dL 9.8  9.4  9.7    Chest imaging: CT Chest 05/03/23 1. Lung-RADS 2, benign appearance or behavior. Continue annual screening with low-dose chest CT without contrast in 12 months. 2. Coronary artery calcifications, aortic Atherosclerosis (ICD10-I70.0) and Emphysema (ICD10-J43.9).  PFT:     No data to display          Labs:  Path:   Assessment & Plan:   Centrilobular emphysema (HCC) - Plan: Ambulatory Referral for DME  Chronic obstructive pulmonary  disease, unspecified COPD type (HCC) - Plan: budesonide  (PULMICORT ) 0.5 MG/2ML nebulizer solution, arformoterol  (BROVANA ) 15 MCG/2ML NEBU, Ambulatory Referral for DME  Acute respiratory failure with hypoxia (HCC) - Plan: albuterol  (VENTOLIN  HFA) 108 (90 Base) MCG/ACT inhaler  Discussion: [name] is a 68 year old woman, daily smoker with history of breast cancer, coronary artery disease, GERD, hypertension and hyperlipidemia who returns to pulmonary clinic for COPD.   Chronic obstructive pulmonary disease (COPD) COPD with increased mucus,  wheezing, and dry cough. Prefers nebulizer due to inhaler challenges.  - Order nebulizer machine and supplies. - Start budesonide  and brovana  nebulizer treatments twice daily - Send refills for albuterol  inhaler PRN.  Former smoker - enrolled in lung cancer screening  Follow up in 6 months.  Dorn Chill, MD [facility] Office: [phone]   Current Outpatient Medications:    Alcohol Swabs (B-D SINGLE USE SWABS REGULAR) PADS, check blood sugars twice daily Dx E11.9, Disp: 200 each, Rfl: 0   arformoterol  (BROVANA ) 15 MCG/2ML NEBU, Take 2 mLs (15 mcg total) by nebulization 2 (two) times daily., Disp: 120 mL, Rfl: 6   aspirin  EC 81 MG tablet, Take 1 tablet (81 mg total) by mouth daily. Swallow whole., Disp: 30 tablet, Rfl: 0   atorvastatin  (LIPITOR) 80 MG tablet, Take 1 tablet (80 mg total) by mouth at bedtime., Disp: 90 tablet, Rfl: 1   Biotin 2.5 MG CAPS, Take by mouth., Disp: , Rfl:    budesonide  (PULMICORT ) 0.5 MG/2ML nebulizer solution, Take 2 mLs (0.5 mg total) by nebulization 2 (two) times daily., Disp: 120 mL, Rfl: 12   Cholecalciferol (VITAMIN D3) 5000 units TABS, Take 10,000 Units by mouth every evening., Disp: , Rfl:    cloNIDine  (CATAPRES ) 0.3 MG tablet, Take 1 tablet (0.3 mg total) by mouth 2 (two) times daily., Disp: 180 tablet, Rfl: 1   fish oil-omega-3 fatty acids 1000 MG capsule, Take 1,000 mg by mouth at  bedtime., Disp: , Rfl:    furosemide  (LASIX ) 20 MG tablet, Take 20 mg by mouth. (Patient taking differently: Take 20 mg by mouth 2 (two) times daily.), Disp: , Rfl:    gabapentin  (NEURONTIN ) 100 MG capsule, Take 1 capsule (100 mg total) by mouth at bedtime., Disp: 90 capsule, Rfl: 1   hydrOXYzine  (ATARAX ) 50 MG tablet, Take 25-100 mg by mouth., Disp: , Rfl:    lisinopril  (ZESTRIL ) 40 MG tablet, Take 1 tablet (40 mg total) by mouth daily., Disp: 90 tablet, Rfl: 3   metFORMIN  (GLUCOPHAGE ) 500 MG tablet, Take 1 tablet (500 mg total) by mouth 2 (two) times daily with a meal., Disp: 180 tablet, Rfl: 1   metFORMIN  (GLUCOPHAGE -XR) 500 MG 24 hr tablet, Take 500 mg by mouth 2 (two) times daily with a meal., Disp: , Rfl:    metoprolol  tartrate (LOPRESSOR ) 25 MG tablet, Take 1 tablet (25 mg total) by mouth 2 (two) times daily., Disp: 180 tablet, Rfl: 1   pramipexole  (MIRAPEX ) 1 MG tablet, Take 1 tablet (1 mg total) by mouth in the morning and at bedtime., Disp: 180 tablet, Rfl: 1   sertraline (ZOLOFT) 100 MG tablet, Take 100 mg by mouth daily., Disp: , Rfl:    vitamin B-12 (CYANOCOBALAMIN ) 1000 MCG tablet, Take 1 tablet (1,000 mcg total) by mouth daily., Disp: 30 tablet, Rfl: 0   albuterol  (VENTOLIN  HFA) 108 (90 Base) MCG/ACT inhaler, Inhale 2 puffs into the lungs every 6 (six) hours as needed for wheezing or shortness of breath., Disp: 8 g, Rfl: 11   hydrochlorothiazide  (HYDRODIURIL ) 25 MG tablet, Take 1 tablet (25 mg total) by mouth daily. (Patient not taking: Reported on 04/26/2024), Disp: 90 tablet, Rfl: 3

## 2024-04-26 NOTE — Patient Instructions (Addendum)
 Stop breztri  inhaler  We will order you a nebulizer machine and supplies  Start Nebulizer regimen: - Budesonide  nebulizer twice daily - Brovana  nebulizer twice daily  Use albuterol  inhaler 1-2 puffs every 4-6 hours as needed  Continue lung cancer screening CT  Follow up in 6 months, call sooner if needed

## 2024-04-28 ENCOUNTER — Telehealth (HOSPITAL_BASED_OUTPATIENT_CLINIC_OR_DEPARTMENT_OTHER): Payer: Self-pay

## 2024-04-28 MED ORDER — BUDESONIDE 0.5 MG/2ML IN SUSP: 0.5000 mg | Freq: Two times a day (BID) | RESPIRATORY_TRACT | 12 refills | Status: AC

## 2024-04-28 MED ORDER — ARFORMOTEROL TARTRATE 15 MCG/2ML IN NEBU: 15.0000 ug | INHALATION_SOLUTION | Freq: Two times a day (BID) | RESPIRATORY_TRACT | 6 refills | Status: AC

## 2024-04-28 NOTE — Addendum Note (Signed)
 Addended by: Bettyjo Lundblad on: 04/28/2024 03:19 PM   Modules accepted: Orders

## 2024-04-28 NOTE — Telephone Encounter (Signed)
 Please let patient know I have sent the prescriptions for the nebulizer medications to [facility] pharmacy that will hopefully be able to file the medications under medicare part B for better coverage.  Have her let us  know if there are still cost issues.  Thanks, JD

## 2024-04-28 NOTE — Telephone Encounter (Signed)
 Copied from CRM [id number]. Topic: Clinical - Prescription Issue >> Apr 27, 2024  2:19 PM Rozanna G wrote: Reason for CRM: Pt called stated she is able to afford the budesonide  (PULMICORT ) 0.5 MG/2ML nebulizer solution stated insurance will not pay ands wants to if there are any  alternatives. Stated have keep trying Breztri  but has a hard time with it.

## 2024-04-30 ENCOUNTER — Other Ambulatory Visit: Payer: Self-pay | Admitting: Nurse Practitioner

## 2024-04-30 DIAGNOSIS — I10 Essential (primary) hypertension: Secondary | ICD-10-CM

## 2024-05-01 NOTE — Telephone Encounter (Signed)
 Spoke w/ Patient [name], she will reach out to the pharmacy to see if the cost of Rx has went down.    - NFN

## 2024-05-03 ENCOUNTER — Ambulatory Visit (HOSPITAL_BASED_OUTPATIENT_CLINIC_OR_DEPARTMENT_OTHER)
Admission: RE | Admit: 2024-05-03 | Discharge: 2024-05-03 | Disposition: A | Discharge status: Home / Self Care | Source: Ambulatory Visit | Attending: Acute Care | Admitting: Acute Care

## 2024-05-03 DIAGNOSIS — Z122 Encounter for screening for malignant neoplasm of respiratory organs: Secondary | ICD-10-CM | POA: Diagnosis present

## 2024-05-03 DIAGNOSIS — Z87891 Personal history of nicotine dependence: Secondary | ICD-10-CM | POA: Diagnosis present

## 2024-05-04 NOTE — Telephone Encounter (Signed)
 Called pt for [place] to check to see how BP's were doing. Pt stated she has not checked her BP, but she does feel better. Asked pt if she could check her BP and call us  back and leave a message for [facility] with BP reading.

## 2024-05-05 ENCOUNTER — Other Ambulatory Visit: Payer: Self-pay

## 2024-05-05 DIAGNOSIS — Z87891 Personal history of nicotine dependence: Secondary | ICD-10-CM

## 2024-05-05 DIAGNOSIS — Z122 Encounter for screening for malignant neoplasm of respiratory organs: Secondary | ICD-10-CM

## 2024-05-08 ENCOUNTER — Ambulatory Visit
Admission: RE | Admit: 2024-05-08 | Discharge: 2024-05-08 | Disposition: A | Discharge status: Home / Self Care | Source: Ambulatory Visit | Attending: Family Medicine | Admitting: Family Medicine

## 2024-05-08 DIAGNOSIS — Z1231 Encounter for screening mammogram for malignant neoplasm of breast: Secondary | ICD-10-CM

## 2024-06-21 ENCOUNTER — Ambulatory Visit: Admitting: Pulmonary Disease

## 2024-07-21 ENCOUNTER — Ambulatory Visit: Admitting: Cardiology

## 2024-08-21 ENCOUNTER — Ambulatory Visit: Admitting: Cardiology

## 2024-09-20 ENCOUNTER — Ambulatory Visit: Admitting: Cardiology
